# Patient Record
Sex: Female | Born: 1960
Health system: Southern US, Community
[De-identification: ages and names within clinical notes are randomized; demographics above are authoritative.]

## PROBLEM LIST (undated history)

## (undated) ENCOUNTER — Ambulatory Visit: Discharge status: Absent without leave | Payer: 59

## (undated) ENCOUNTER — Ambulatory Visit: Attending: Pharmacist | Primary: Pharmacist

## (undated) ENCOUNTER — Ambulatory Visit: Attending: Ambulatory Care | Primary: Ambulatory Care

## (undated) ENCOUNTER — Encounter: Attending: Ambulatory Care | Primary: Ambulatory Care

## (undated) ENCOUNTER — Encounter

## (undated) ENCOUNTER — Telehealth

## (undated) ENCOUNTER — Ambulatory Visit

## (undated) ENCOUNTER — Ambulatory Visit: Payer: Medicare (Managed Care)

## (undated) ENCOUNTER — Encounter: Payer: MEDICARE | Attending: Orthopaedic Surgery | Primary: Orthopaedic Surgery

## (undated) ENCOUNTER — Ambulatory Visit: Payer: MEDICARE | Attending: Rheumatology | Primary: Rheumatology

## (undated) ENCOUNTER — Ambulatory Visit: Payer: MEDICARE

## (undated) ENCOUNTER — Encounter: Attending: Orthopaedic Surgery | Primary: Orthopaedic Surgery

## (undated) ENCOUNTER — Encounter
Payer: MEDICARE | Attending: Student in an Organized Health Care Education/Training Program | Primary: Student in an Organized Health Care Education/Training Program

## (undated) ENCOUNTER — Telehealth: Attending: Clinical | Primary: Clinical

## (undated) ENCOUNTER — Encounter
Attending: Student in an Organized Health Care Education/Training Program | Primary: Student in an Organized Health Care Education/Training Program

## (undated) ENCOUNTER — Telehealth: Attending: Internal Medicine | Primary: Internal Medicine

## (undated) ENCOUNTER — Ambulatory Visit: Payer: MEDICARE | Attending: Orthopaedic Surgery | Primary: Orthopaedic Surgery

## (undated) ENCOUNTER — Encounter: Attending: Family | Primary: Family

## (undated) ENCOUNTER — Ambulatory Visit: Attending: Nurse Practitioner | Primary: Nurse Practitioner

## (undated) ENCOUNTER — Telehealth: Attending: Dermatology | Primary: Dermatology

## (undated) ENCOUNTER — Encounter: Attending: Rheumatology | Primary: Rheumatology

## (undated) ENCOUNTER — Ambulatory Visit: Payer: MEDICAID

## (undated) ENCOUNTER — Ambulatory Visit: Attending: Rheumatology | Primary: Rheumatology

## (undated) ENCOUNTER — Ambulatory Visit: Attending: Family Medicine | Primary: Family Medicine

## (undated) ENCOUNTER — Telehealth: Attending: Rheumatology | Primary: Rheumatology

## (undated) ENCOUNTER — Telehealth: Attending: Family | Primary: Family

## (undated) ENCOUNTER — Encounter: Payer: MEDICARE | Attending: Rheumatology | Primary: Rheumatology

## (undated) ENCOUNTER — Encounter: Attending: Clinical | Primary: Clinical

## (undated) ENCOUNTER — Telehealth: Attending: Ambulatory Care | Primary: Ambulatory Care

## (undated) ENCOUNTER — Ambulatory Visit
Payer: MEDICARE | Attending: Student in an Organized Health Care Education/Training Program | Primary: Student in an Organized Health Care Education/Training Program

## (undated) ENCOUNTER — Encounter: Attending: Adult Health | Primary: Adult Health

## (undated) ENCOUNTER — Encounter: Payer: MEDICARE | Attending: Dermatology | Primary: Dermatology

## (undated) ENCOUNTER — Ambulatory Visit: Payer: MEDICARE | Attending: Dermatology | Primary: Dermatology

## (undated) ENCOUNTER — Ambulatory Visit: Payer: MEDICARE | Attending: Family | Primary: Family

## (undated) ENCOUNTER — Ambulatory Visit: Payer: MEDICARE | Attending: Anesthesiology | Primary: Anesthesiology

## (undated) ENCOUNTER — Ambulatory Visit: Attending: Community Health | Primary: Community Health

## (undated) ENCOUNTER — Ambulatory Visit: Attending: Orthopaedic Surgery | Primary: Orthopaedic Surgery

## (undated) ENCOUNTER — Encounter: Payer: MEDICARE | Attending: Anesthesiology | Primary: Anesthesiology

## (undated) ENCOUNTER — Telehealth: Attending: Orthopaedic Surgery | Primary: Orthopaedic Surgery

## (undated) ENCOUNTER — Encounter: Attending: Family Medicine | Primary: Family Medicine

## (undated) ENCOUNTER — Non-Acute Institutional Stay: Payer: MEDICARE

## (undated) DIAGNOSIS — J449 Chronic obstructive pulmonary disease, unspecified: Secondary | ICD-10-CM

## (undated) DIAGNOSIS — M1990 Unspecified osteoarthritis, unspecified site: Secondary | ICD-10-CM

## (undated) DIAGNOSIS — F4310 Post-traumatic stress disorder, unspecified: Secondary | ICD-10-CM

## (undated) DIAGNOSIS — M797 Fibromyalgia: Secondary | ICD-10-CM

## (undated) DIAGNOSIS — M5030 Other cervical disc degeneration, unspecified cervical region: Secondary | ICD-10-CM

## (undated) DIAGNOSIS — F419 Anxiety disorder, unspecified: Secondary | ICD-10-CM

## (undated) DIAGNOSIS — F32A Depression, unspecified: Secondary | ICD-10-CM

## (undated) DIAGNOSIS — L409 Psoriasis, unspecified: Secondary | ICD-10-CM

## (undated) DIAGNOSIS — K589 Irritable bowel syndrome without diarrhea: Secondary | ICD-10-CM

## (undated) DIAGNOSIS — F329 Major depressive disorder, single episode, unspecified: Secondary | ICD-10-CM

## (undated) DIAGNOSIS — K219 Gastro-esophageal reflux disease without esophagitis: Secondary | ICD-10-CM

## (undated) DIAGNOSIS — E785 Hyperlipidemia, unspecified: Secondary | ICD-10-CM

## (undated) HISTORY — PX: TOTAL ABDOMINAL HYSTERECTOMY: SHX209

## (undated) HISTORY — PX: KNEE SURGERY: SHX244

## (undated) HISTORY — PX: NECK SURGERY: SHX720

## (undated) HISTORY — DX: Irritable bowel syndrome, unspecified: K58.9

## (undated) HISTORY — PX: ABDOMINAL HYSTERECTOMY: SHX81

## (undated) HISTORY — PX: CARPAL TUNNEL RELEASE: SHX101

## (undated) MED ORDER — LORATADINE 10 MG TABLET: 10 mg | tablet | Freq: Every day | 2 refills | 30 days

---
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
## 2007-02-18 ENCOUNTER — Emergency Department: Payer: Self-pay | Admitting: Emergency Medicine

## 2008-09-16 ENCOUNTER — Emergency Department: Payer: Self-pay | Admitting: Emergency Medicine

## 2010-04-28 ENCOUNTER — Emergency Department: Payer: Self-pay | Admitting: Emergency Medicine

## 2010-05-11 ENCOUNTER — Emergency Department: Payer: Self-pay | Admitting: Emergency Medicine

## 2010-08-25 ENCOUNTER — Emergency Department: Payer: Self-pay | Admitting: Unknown Physician Specialty

## 2010-09-14 ENCOUNTER — Emergency Department: Payer: Self-pay | Admitting: Emergency Medicine

## 2010-11-10 ENCOUNTER — Emergency Department: Payer: Self-pay

## 2010-11-12 ENCOUNTER — Inpatient Hospital Stay: Payer: Self-pay | Admitting: *Deleted

## 2010-12-15 ENCOUNTER — Emergency Department: Payer: Self-pay | Admitting: Emergency Medicine

## 2011-05-03 ENCOUNTER — Emergency Department: Payer: Self-pay | Admitting: Emergency Medicine

## 2012-06-17 ENCOUNTER — Emergency Department: Payer: Self-pay | Admitting: Emergency Medicine

## 2013-04-22 ENCOUNTER — Emergency Department: Payer: Self-pay | Admitting: Emergency Medicine

## 2013-04-22 LAB — COMPREHENSIVE METABOLIC PANEL
Albumin: 3.2 g/dL — ABNORMAL LOW (ref 3.4–5.0)
Anion Gap: 3 — ABNORMAL LOW (ref 7–16)
Co2: 28 mmol/L (ref 21–32)
Creatinine: 0.73 mg/dL (ref 0.60–1.30)
EGFR (African American): >60
EGFR (Non-African Amer.): >60
Glucose: 93 mg/dL (ref 65–99)
Osmolality: 287 (ref 275–301)
SGOT(AST): 36 U/L (ref 15–37)
SGPT (ALT): 33 U/L (ref 12–78)
Sodium: 144 mmol/L (ref 136–145)
Total Protein: 6.1 g/dL — ABNORMAL LOW (ref 6.4–8.2)

## 2013-04-22 LAB — SEDIMENTATION RATE: Erythrocyte Sed Rate: 8 mm/hr (ref 0–30)

## 2013-04-22 LAB — CBC WITH DIFFERENTIAL/PLATELET
Basophil #: 0.1 10*3/uL (ref 0.0–0.1)
Basophil %: 1.3 %
Eosinophil #: 0.3 10*3/uL (ref 0.0–0.7)
Eosinophil %: 3.7 %
HCT: 37 % (ref 35.0–47.0)
Lymphocyte %: 35.1 %
MCH: 35.6 pg — ABNORMAL HIGH (ref 26.0–34.0)
MCHC: 34.8 g/dL (ref 32.0–36.0)
MCV: 102 fL — ABNORMAL HIGH (ref 80–100)
WBC: 7.9 10*3/uL (ref 3.6–11.0)

## 2013-09-10 ENCOUNTER — Emergency Department: Payer: Self-pay | Admitting: Emergency Medicine

## 2016-01-25 ENCOUNTER — Other Ambulatory Visit: Payer: Self-pay | Admitting: General Practice

## 2016-01-25 DIAGNOSIS — J449 Chronic obstructive pulmonary disease, unspecified: Secondary | ICD-10-CM

## 2016-02-07 ENCOUNTER — Ambulatory Visit: Payer: Self-pay

## 2016-02-12 ENCOUNTER — Ambulatory Visit: Payer: Disability Insurance | Attending: General Practice

## 2016-02-12 DIAGNOSIS — J449 Chronic obstructive pulmonary disease, unspecified: Secondary | ICD-10-CM | POA: Diagnosis not present

## 2016-09-11 ENCOUNTER — Emergency Department
Admission: EM | Admit: 2016-09-11 | Discharge: 2016-09-11 | Disposition: A | Discharge status: Home / Self Care | Payer: Disability Insurance | Attending: Emergency Medicine | Admitting: Emergency Medicine

## 2016-09-11 ENCOUNTER — Emergency Department: Payer: Disability Insurance

## 2016-09-11 ENCOUNTER — Encounter: Payer: Self-pay | Admitting: Emergency Medicine

## 2016-09-11 DIAGNOSIS — M545 Low back pain: Secondary | ICD-10-CM | POA: Insufficient documentation

## 2016-09-11 DIAGNOSIS — J449 Chronic obstructive pulmonary disease, unspecified: Secondary | ICD-10-CM | POA: Insufficient documentation

## 2016-09-11 DIAGNOSIS — Y929 Unspecified place or not applicable: Secondary | ICD-10-CM | POA: Insufficient documentation

## 2016-09-11 DIAGNOSIS — Y9301 Activity, walking, marching and hiking: Secondary | ICD-10-CM | POA: Insufficient documentation

## 2016-09-11 DIAGNOSIS — Z79899 Other long term (current) drug therapy: Secondary | ICD-10-CM | POA: Insufficient documentation

## 2016-09-11 DIAGNOSIS — Y999 Unspecified external cause status: Secondary | ICD-10-CM | POA: Insufficient documentation

## 2016-09-11 DIAGNOSIS — W010XXA Fall on same level from slipping, tripping and stumbling without subsequent striking against object, initial encounter: Secondary | ICD-10-CM | POA: Insufficient documentation

## 2016-09-11 DIAGNOSIS — W19XXXA Unspecified fall, initial encounter: Secondary | ICD-10-CM

## 2016-09-11 DIAGNOSIS — F17200 Nicotine dependence, unspecified, uncomplicated: Secondary | ICD-10-CM | POA: Insufficient documentation

## 2016-09-11 DIAGNOSIS — S86812A Strain of other muscle(s) and tendon(s) at lower leg level, left leg, initial encounter: Secondary | ICD-10-CM | POA: Insufficient documentation

## 2016-09-11 DIAGNOSIS — S86912A Strain of unspecified muscle(s) and tendon(s) at lower leg level, left leg, initial encounter: Secondary | ICD-10-CM

## 2016-09-11 DIAGNOSIS — S93402A Sprain of unspecified ligament of left ankle, initial encounter: Secondary | ICD-10-CM

## 2016-09-11 HISTORY — DX: Unspecified osteoarthritis, unspecified site: M19.90

## 2016-09-11 HISTORY — DX: Chronic obstructive pulmonary disease, unspecified: J44.9

## 2016-09-11 HISTORY — DX: Anxiety disorder, unspecified: F41.9

## 2016-09-11 HISTORY — DX: Depression, unspecified: F32.A

## 2016-09-11 HISTORY — DX: Major depressive disorder, single episode, unspecified: F32.9

## 2016-09-11 MED ORDER — HYDROCODONE-ACETAMINOPHEN 5-325 MG PO TABS
1.0000 | ORAL_TABLET | Freq: Once | ORAL | Status: AC
  Administered 2016-09-11: 1 via ORAL
  Filled 2016-09-11: qty 1

## 2016-09-11 MED ORDER — HYDROCODONE-ACETAMINOPHEN 5-325 MG PO TABS: 1.0000 | ORAL_TABLET | ORAL | 0 refills | Status: DC | PRN

## 2016-09-11 MED ORDER — MELOXICAM 15 MG PO TABS: 15.0000 mg | ORAL_TABLET | Freq: Every day | ORAL | 0 refills | Status: DC

## 2016-09-11 NOTE — ED Notes (Signed)
See triage note  States she tripped yesterday   Landed on left side  having pain to left ankle,knee lower back and left side of face

## 2016-09-11 NOTE — ED Triage Notes (Signed)
Pt to ed with c/o fall yesterday while walking outside. Pt states she tripped and fell.  Now with c/o left side pain, left leg pain and left ankle pain.  Pt alert at triage.  Pt denies loss of consciousness with fall.

## 2016-09-11 NOTE — ED Provider Notes (Signed)
[facility] Emergency Department Provider Note  ____________________________________________  Time seen: Approximately 3:32 PM  I have reviewed the triage vital signs and the nursing notes.   HISTORY  Chief Complaint Fall    HPI Emily Johnston is a 55 y.o. female who presents emergency department complaining of left ankle, left knee, lower back pain. Patient states that she was outside walking on her property when she tripped and fell yesterday. She states that she landed on her left side. She is now complaining of left ankle, left knee, lower back pain. Patient denies any radicular symptoms, bowel or bladder disorders, saddle anesthesia, paresthesias. Patient reports that she is able to bear weight but doing so results and great increase in pain. Patient tried over-the-counter medication at home with no relief. No other complaints at this time. Patient did not hit her head or lose consciousness at anytime.   Past Medical History:  Diagnosis Date  . Anxiety   . Arthritis   . COPD (chronic obstructive pulmonary disease) ([place])   . Depression     There are no active problems to display for this patient.   History reviewed. No pertinent surgical history.  Prior to Admission medications   Medication Sig Start Date End Date Taking? Authorizing Provider  ALPRAZolam Duanne Moron) 1 MG tablet Take 1 mg by mouth at bedtime as needed for anxiety.   Yes Historical Provider, MD  methotrexate (50 MG/ML) 1 g injection Inject into the vein once.   Yes Historical Provider, MD  venlafaxine (EFFEXOR) 50 MG tablet Take 50 mg by mouth 2 (two) times daily.   Yes Historical Provider, MD  HYDROcodone-acetaminophen (NORCO/VICODIN) 5-325 MG tablet Take 1 tablet by mouth every 4 (four) hours as needed for moderate pain. 09/11/16   Charline Bills Marius Betts, PA-C  meloxicam (MOBIC) 15 MG tablet Take 1 tablet (15 mg total) by mouth daily. 09/11/16   Charline Bills Idolina Mantell, PA-C     Allergies Patient has no known allergies.  History reviewed. No pertinent family history.  Social History Social History  Substance Use Topics  . Smoking status: Current Every Day Smoker  . Smokeless tobacco: Never Used  . Alcohol use No     Review of Systems  Constitutional: No fever/chills Cardiovascular: no chest pain. Respiratory: no cough. No SOB. Musculoskeletal: Positive for lower back, left knee, left ankle pain Skin: Negative for rash, abrasions, lacerations, ecchymosis. Neurological: Negative for headaches, focal weakness or numbness. 10-point ROS otherwise negative.  ____________________________________________   PHYSICAL EXAM:  VITAL SIGNS: ED Triage Vitals  Enc Vitals Group     BP 09/11/16 1459 (!) 135/93     Pulse Rate 09/11/16 1459 96     Resp 09/11/16 1459 20     Temp 09/11/16 1459 98.5 F (36.9 C)     Temp Source 09/11/16 1459 Oral     SpO2 09/11/16 1459 100 %     Weight 09/11/16 1459 140 lb (63.5 kg)     Height 09/11/16 1459 5\' 4"  (1.626 m)     Head Circumference --      Peak Flow --      Pain Score 09/11/16 1500 10     Pain Loc --      Pain Edu? --      Excl. in [place]? --      Constitutional: Alert and oriented. Well appearing and in no acute distress. Eyes: Conjunctivae are normal. PERRL. EOMI. Head: Atraumatic. Neck: No stridor.  No cervical spine tenderness to palpation.  Cardiovascular: Normal rate, regular rhythm. Normal S1 and S2.  Good peripheral circulation. Respiratory: Normal respiratory effort without tachypnea or retractions. Lungs CTAB. Good air entry to the bases with no decreased or absent breath sounds. Musculoskeletal: Full range of motion to all extremities. No gross deformities appreciated.Patient is mildly tender to palpation midline lumbar spine region. No palpable abnormality. No step-off. No tenderness to palpation bilateral sciatic notches. There ispulse and sensation intact and equal lower extremities. No deformity  noted to left knee. Full range of motion. Patient is tender to palpation along the lateral aspect of the knee. Varus, valgus, Lachman's, McMurray's is negative. No deformity or edema noted to the left ankle but inspection. Full range of motion. Patient is tender to palpation over the lateral malleolus. Sensation and pulses intact bilateral lower extremity. Neurologic:  Normal speech and language. No gross focal neurologic deficits are appreciated.  Skin:  Skin is warm, dry and intact. No rash noted. Psychiatric: Mood and affect are normal. Speech and behavior are normal. Patient exhibits appropriate insight and judgement.   ____________________________________________   LABS (all labs ordered are listed, but only abnormal results are displayed)  Labs Reviewed - No data to display ____________________________________________  EKG   ____________________________________________  RADIOLOGY Diamantina Providence Rotunda Worden, personally viewed and evaluated these images (plain radiographs) as part of my medical decision making, as well as reviewing the written report by the radiologist.  Dg Lumbar Spine Complete  Result Date: 09/11/2016 CLINICAL DATA:  [place] yesterday and injured back. EXAM: LUMBAR SPINE - COMPLETE 4+ VIEW COMPARISON:  None. FINDINGS: Normal alignment of the lumbar vertebral bodies. Disc spaces and vertebral bodies are maintained. Mild degenerative changes with small marginal osteophytes. The facets are normally aligned. No pars defects. The visualized bony pelvis is intact. The SI joints appear normal. Age advanced aortoiliac vascular calcifications are noted without definite aneurysm. IMPRESSION: Normal alignment and no acute bony findings. Minimal degenerative changes. Age advanced atherosclerotic calcifications. Electronically Signed   By: Marijo Sanes M.D.   On: 09/11/2016 16:26   Dg Ankle Complete Left  Result Date: 09/11/2016 CLINICAL DATA:  [place] yesterday and injured left  ankle. EXAM: LEFT ANKLE COMPLETE - 3+ VIEW COMPARISON:  06/17/2012 FINDINGS: The ankle mortise is maintained. No ankle fracture or osteochondral abnormality. No definite ankle joint effusion. The mid and hindfoot bony structures are intact. Small calcaneal heel spur. Os trigonum is also noted. IMPRESSION: No acute fracture. Electronically Signed   By: Marijo Sanes M.D.   On: 09/11/2016 16:25   Dg Knee Complete 4 Views Left  Result Date: 09/11/2016 CLINICAL DATA:  [place] yesterday and injured left knee. EXAM: LEFT KNEE - COMPLETE 4+ VIEW COMPARISON:  None. FINDINGS: The joint spaces are maintained. No acute fracture, significant degenerative changes or osteochondral abnormality. No joint effusion. IMPRESSION: No acute bony findings. Electronically Signed   By: Marijo Sanes M.D.   On: 09/11/2016 16:24    ____________________________________________    PROCEDURES  Procedure(s) performed:    Procedures    Medications  HYDROcodone-acetaminophen (NORCO/VICODIN) 5-325 MG per tablet 1 tablet (1 tablet Oral Given 09/11/16 1556)     ____________________________________________   INITIAL IMPRESSION / ASSESSMENT AND PLAN / ED COURSE  Pertinent labs & imaging results that were available during my care of the patient were reviewed by me and considered in my medical decision making (see chart for details).  Review of the [place] CSRS was performed in accordance of the [place] prior to dispensing any controlled drugs.  Clinical  Course     Patient's diagnosis is consistent with Fall resulting in strain and sprain of the left knee and ankle. No acute findings on x-ray. Exam is reassuring. No indication for further imaging or labs.. Patient will be discharged home with prescriptions for anti-inflammatories and limited pain medication. Patient is to follow up with primary care as needed or otherwise directed. Patient is given ED precautions to return to the ED for any worsening or new  symptoms.     ____________________________________________  FINAL CLINICAL IMPRESSION(S) / ED DIAGNOSES  Final diagnoses:  Fall, initial encounter  Strain of left knee, initial encounter  Sprain of left ankle, unspecified ligament, initial encounter      NEW MEDICATIONS STARTED DURING THIS VISIT:  New Prescriptions   HYDROCODONE-ACETAMINOPHEN (NORCO/VICODIN) 5-325 MG TABLET    Take 1 tablet by mouth every 4 (four) hours as needed for moderate pain.   MELOXICAM (MOBIC) 15 MG TABLET    Take 1 tablet (15 mg total) by mouth daily.        This chart was dictated using voice recognition software/Dragon. Despite best efforts to proofread, errors can occur which can change the meaning. Any change was purely unintentional.    Darletta Moll, PA-C 09/11/16 Palmer Yao, MD 09/12/16 1758

## 2016-11-03 ENCOUNTER — Emergency Department
Admission: EM | Admit: 2016-11-03 | Discharge: 2016-11-03 | Disposition: A | Discharge status: Home / Self Care | Payer: Self-pay | Attending: Student in an Organized Health Care Education/Training Program | Admitting: Student in an Organized Health Care Education/Training Program

## 2016-11-03 ENCOUNTER — Emergency Department: Payer: Self-pay

## 2016-11-03 ENCOUNTER — Encounter: Payer: Self-pay | Admitting: *Deleted

## 2016-11-03 DIAGNOSIS — M545 Low back pain, unspecified: Secondary | ICD-10-CM

## 2016-11-03 DIAGNOSIS — R319 Hematuria, unspecified: Secondary | ICD-10-CM | POA: Insufficient documentation

## 2016-11-03 DIAGNOSIS — F17200 Nicotine dependence, unspecified, uncomplicated: Secondary | ICD-10-CM | POA: Insufficient documentation

## 2016-11-03 DIAGNOSIS — G8929 Other chronic pain: Secondary | ICD-10-CM

## 2016-11-03 DIAGNOSIS — Z79899 Other long term (current) drug therapy: Secondary | ICD-10-CM | POA: Insufficient documentation

## 2016-11-03 DIAGNOSIS — J449 Chronic obstructive pulmonary disease, unspecified: Secondary | ICD-10-CM | POA: Insufficient documentation

## 2016-11-03 LAB — URINALYSIS, COMPLETE (UACMP) WITH MICROSCOPIC
Bacteria, UA: NONE SEEN
Bilirubin Urine: NEGATIVE
GLUCOSE, UA: NEGATIVE mg/dL
Ketones, ur: NEGATIVE mg/dL
Leukocytes, UA: NEGATIVE
NITRITE: NEGATIVE
PH: 5 (ref 5.0–8.0)
Protein, ur: NEGATIVE mg/dL
Specific Gravity, Urine: 1.006 (ref 1.005–1.030)

## 2016-11-03 MED ORDER — CYCLOBENZAPRINE HCL 10 MG PO TABS: 10.0000 mg | ORAL_TABLET | Freq: Three times a day (TID) | ORAL | 0 refills | Status: DC | PRN

## 2016-11-03 MED ORDER — CYCLOBENZAPRINE HCL 10 MG PO TABS
ORAL_TABLET | ORAL | Status: AC
  Filled 2016-11-03: qty 1

## 2016-11-03 MED ORDER — POLYETHYLENE GLYCOL 3350 17 G PO PACK: 17.0000 g | PACK | Freq: Every day | ORAL | 0 refills | Status: DC

## 2016-11-03 MED ORDER — BUPIVACAINE HCL (PF) 0.5 % IJ SOLN
INTRAMUSCULAR | Status: AC
  Administered 2016-11-03: 30 mL
  Filled 2016-11-03: qty 30

## 2016-11-03 MED ORDER — OXYCODONE-ACETAMINOPHEN 5-325 MG PO TABS
ORAL_TABLET | ORAL | Status: AC
  Filled 2016-11-03: qty 1

## 2016-11-03 MED ORDER — OXYCODONE-ACETAMINOPHEN 5-325 MG PO TABS
1.0000 | ORAL_TABLET | Freq: Once | ORAL | Status: AC
  Administered 2016-11-03: 1 via ORAL

## 2016-11-03 MED ORDER — BUPIVACAINE HCL (PF) 0.5 % IJ SOLN
30.0000 mL | Freq: Once | INTRAMUSCULAR | Status: AC
  Administered 2016-11-03: 30 mL
  Filled 2016-11-03: qty 30

## 2016-11-03 MED ORDER — HYDROCODONE-ACETAMINOPHEN 5-325 MG PO TABS
2.0000 | ORAL_TABLET | Freq: Four times a day (QID) | ORAL | 0 refills | Status: DC | PRN
Start: 2016-11-03 — End: 2019-02-22

## 2016-11-03 MED ORDER — CYCLOBENZAPRINE HCL 10 MG PO TABS
5.0000 mg | ORAL_TABLET | Freq: Once | ORAL | Status: AC
  Administered 2016-11-03: 5 mg via ORAL

## 2016-11-03 NOTE — ED Notes (Signed)
Pt states back pain that is worse than normal. Denies it feeling like spasm, describes it as pulling. Pt ambulatory but any slight movement from side to side or turning makes pain worse. Denies falling or being hit in back. Fell few weeks ago and sprained L knee and L ankle. States back was bruised then but pain was not like it is now.

## 2016-11-03 NOTE — ED Provider Notes (Signed)
[facility] Emergency Department Provider Note    First MD Initiated Contact with Patient 11/03/16 1545     (approximate)  I have reviewed the triage vital signs and the nursing notes.   HISTORY  Chief Complaint Back Pain    HPI Emily Johnston is a 56 y.o. female chief complaint of left lower back pain since yesterday. Denies any flank pain or dysuria.  She denies any numbness or tingling. States that she is having difficulty standing up and had to sit on her commode to get her pants on today. Pain is worsened with movement of the trunk and low back. Pain is worsened when trying to pick things up off the floor. She is not having any tingling in her perineal area. No difficulty moving her bowels. No difficulty urinating.   Past Medical History:  Diagnosis Date  . Anxiety   . Arthritis   . COPD (chronic obstructive pulmonary disease) ([place])   . Depression    History reviewed. No pertinent family history. History reviewed. No pertinent surgical history. There are no active problems to display for this patient.     Prior to Admission medications   Medication Sig Start Date End Date Taking? Authorizing Provider  ALPRAZolam Duanne Moron) 1 MG tablet Take 1 mg by mouth at bedtime as needed for anxiety.    Historical Provider, MD  cyclobenzaprine (FLEXERIL) 10 MG tablet Take 1 tablet (10 mg total) by mouth 3 (three) times daily as needed for muscle spasms. 11/03/16   Merlyn Lot, MD  HYDROcodone-acetaminophen (NORCO) 5-325 MG tablet Take 2 tablets by mouth every 6 (six) hours as needed for moderate pain. 11/03/16   Merlyn Lot, MD  HYDROcodone-acetaminophen (NORCO/VICODIN) 5-325 MG tablet Take 1 tablet by mouth every 4 (four) hours as needed for moderate pain. 09/11/16   Charline Bills Cuthriell, PA-C  meloxicam (MOBIC) 15 MG tablet Take 1 tablet (15 mg total) by mouth daily. 09/11/16   Charline Bills Cuthriell, PA-C  methotrexate (50 MG/ML) 1 g injection  Inject into the vein once.    Historical Provider, MD  polyethylene glycol (MIRALAX / GLYCOLAX) packet Take 17 g by mouth daily. Mix one tablespoon with 8oz of your favorite juice or water every day until you are having soft formed stools. Then start taking once daily if you didn't have a stool the day before. 11/03/16   Merlyn Lot, MD  venlafaxine (EFFEXOR) 50 MG tablet Take 50 mg by mouth 2 (two) times daily.    Historical Provider, MD    Allergies Patient has no known allergies.    Social History Social History  Substance Use Topics  . Smoking status: Current Every Day Smoker  . Smokeless tobacco: Never Used  . Alcohol use No    Review of Systems Patient denies headaches, rhinorrhea, blurry vision, numbness, shortness of breath, chest pain, edema, cough, abdominal pain, nausea, vomiting, diarrhea, dysuria, fevers, rashes or hallucinations unless otherwise stated above in HPI. ____________________________________________   PHYSICAL EXAM:  VITAL SIGNS: Vitals:   11/03/16 1453  BP: (!) 153/86  Pulse: 80  Resp: 18  Temp: 98.3 F (36.8 C)    Constitutional: Alert and oriented. Well appearing and in no acute distress. Eyes: Conjunctivae are normal. PERRL. EOMI. Head: Atraumatic. Nose: No congestion/rhinnorhea. Mouth/Throat: Mucous membranes are moist.  Oropharynx non-erythematous. Neck: No stridor. Painless ROM. No cervical spine tenderness to palpation Hematological/Lymphatic/Immunilogical: No cervical lymphadenopathy. Cardiovascular: Normal rate, regular rhythm. Grossly normal heart sounds.  Good peripheral circulation. Respiratory: Normal  respiratory effort.  No retractions. Lungs CTAB. Gastrointestinal: Soft and nontender. No distention. No abdominal bruits. No CVA tenderness. Genitourinary:  Musculoskeletal: No lower extremity tenderness nor edema.  No joint effusions.  ttp over lying left SI joint Neurologic:  Normal speech and language. No gross focal  neurologic deficits are appreciated. No gait instability.  5/5 strenght BLE.  DTRs 2+ and equal.  SILT distally.  -straight leg raise.  Pain reproduce with elevation of right leg Skin:  Skin is warm, dry and intact. No rash noted. Psychiatric: Mood and affect are normal. Speech and behavior are normal.  ____________________________________________   LABS (all labs ordered are listed, but only abnormal results are displayed)  Results for orders placed or performed during the hospital encounter of 11/03/16 (from the past 24 hour(s))  Urinalysis, Complete w Microscopic     Status: Abnormal   Collection Time: 11/03/16  2:54 PM  Result Value Ref Range   Color, Urine YELLOW (A) YELLOW   APPearance CLEAR (A) CLEAR   Specific Gravity, Urine 1.006 1.005 - 1.030   pH 5.0 5.0 - 8.0   Glucose, UA NEGATIVE NEGATIVE mg/dL   Hgb urine dipstick MODERATE (A) NEGATIVE   Bilirubin Urine NEGATIVE NEGATIVE   Ketones, ur NEGATIVE NEGATIVE mg/dL   Protein, ur NEGATIVE NEGATIVE mg/dL   Nitrite NEGATIVE NEGATIVE   Leukocytes, UA NEGATIVE NEGATIVE   RBC / HPF 6-30 0 - 5 RBC/hpf   WBC, UA 0-5 0 - 5 WBC/hpf   Bacteria, UA NONE SEEN NONE SEEN   Squamous Epithelial / LPF 0-5 (A) NONE SEEN   Mucous PRESENT    ____________________________________________  EKG____________________________________________  RADIOLOGY  I personally reviewed all radiographic images ordered to evaluate for the above acute complaints and reviewed radiology reports and findings.  These findings were personally discussed with the patient.  Please see medical record for radiology report.  ____________________________________________   PROCEDURES  Procedure(s) performed:  Procedures    Critical Care performed: no ____________________________________________   INITIAL IMPRESSION / ASSESSMENT AND PLAN / ED COURSE  Pertinent labs & imaging results that were available during my care of the patient were reviewed by me and  considered in my medical decision making (see chart for details).  DDX: msk strain, lumbago, fracture, stone, uti  Emily Johnston is a 56 y.o. who presents to the ED with No history of injury or trauma. No recent back instrumentation/procedures. No fevers. Denies cord compression symptoms. No bowel/bladder incontinence or retention, no LE weakness. VSS in ED. Exam with no LE weakness bilat., no sensory deficits, normal DTRs, no clonus, no saddle anesthesia. Pain with palpation of back and with trunk movement. Likely MSK related pain, probable lumbar strain. History and physical exam less consistent with kidney stone or pyelonephritis but will be evaluated with UA and KUB. Treatments will include observation, analgesia, and arrange appropriate follow up for recheck.   Clinical Course as of Nov 03 1724  Mon Nov 03, 2016  1709 Urinalysis without any evidence of infection. Does have small amount of blood which may represent a kidney stone. Exam seems very muscle skeletal. Patient had taken relief in back pain after injection of local Marcaine to the point of maximal tenderness in her back. Do suspect there is a large component of muscular skeletal strain lumbago.  [PR]    Clinical Course User Index [PR] Merlyn Lot, MD    Clinical picture is not consistent with epidural abscess , fracture, or cauda equina syndrome. Plan supportive care, follow  up for recheck   ____________________________________________   FINAL CLINICAL IMPRESSION(S) / ED DIAGNOSES  Final diagnoses:  Chronic left-sided low back pain without sciatica  Hematuria, unspecified type      NEW MEDICATIONS STARTED DURING THIS VISIT:  New Prescriptions   CYCLOBENZAPRINE (FLEXERIL) 10 MG TABLET    Take 1 tablet (10 mg total) by mouth 3 (three) times daily as needed for muscle spasms.   HYDROCODONE-ACETAMINOPHEN (NORCO) 5-325 MG TABLET    Take 2 tablets by mouth every 6 (six) hours as needed for moderate pain.    POLYETHYLENE GLYCOL (MIRALAX / GLYCOLAX) PACKET    Take 17 g by mouth daily. Mix one tablespoon with 8oz of your favorite juice or water every day until you are having soft formed stools. Then start taking once daily if you didn't have a stool the day before.     Note:  This document was prepared using Dragon voice recognition software and may include unintentional dictation errors.    Merlyn Lot, MD 11/03/16 [phone]

## 2016-11-03 NOTE — ED Triage Notes (Addendum)
States lower back pain since yesterday, denies any urinary symptoms, denies any known injury, states increased pain with movement

## 2016-11-03 NOTE — Discharge Instructions (Signed)

## 2016-11-03 NOTE — ED Notes (Signed)
Pt taken to xray 

## 2017-02-19 ENCOUNTER — Emergency Department: Payer: Self-pay

## 2017-02-19 ENCOUNTER — Encounter: Payer: Self-pay | Admitting: Emergency Medicine

## 2017-02-19 ENCOUNTER — Emergency Department
Admission: EM | Admit: 2017-02-19 | Discharge: 2017-02-19 | Disposition: A | Discharge status: Home / Self Care | Payer: Self-pay | Attending: Emergency Medicine | Admitting: Emergency Medicine

## 2017-02-19 DIAGNOSIS — F13930 Sedative, hypnotic or anxiolytic use, unspecified with withdrawal, uncomplicated: Secondary | ICD-10-CM

## 2017-02-19 DIAGNOSIS — F17200 Nicotine dependence, unspecified, uncomplicated: Secondary | ICD-10-CM | POA: Insufficient documentation

## 2017-02-19 DIAGNOSIS — F13230 Sedative, hypnotic or anxiolytic dependence with withdrawal, uncomplicated: Secondary | ICD-10-CM

## 2017-02-19 DIAGNOSIS — F419 Anxiety disorder, unspecified: Secondary | ICD-10-CM | POA: Insufficient documentation

## 2017-02-19 DIAGNOSIS — F13239 Sedative, hypnotic or anxiolytic dependence with withdrawal, unspecified: Secondary | ICD-10-CM | POA: Insufficient documentation

## 2017-02-19 DIAGNOSIS — J449 Chronic obstructive pulmonary disease, unspecified: Secondary | ICD-10-CM | POA: Insufficient documentation

## 2017-02-19 HISTORY — DX: Post-traumatic stress disorder, unspecified: F43.10

## 2017-02-19 LAB — COMPREHENSIVE METABOLIC PANEL
ALK PHOS: 82 U/L (ref 38–126)
ALT: 18 U/L (ref 14–54)
ANION GAP: 8 (ref 5–15)
AST: 24 U/L (ref 15–41)
Albumin: 3.8 g/dL (ref 3.5–5.0)
BILIRUBIN TOTAL: 0.5 mg/dL (ref 0.3–1.2)
BUN: 10 mg/dL (ref 6–20)
CALCIUM: 9.5 mg/dL (ref 8.9–10.3)
CO2: 26 mmol/L (ref 22–32)
CREATININE: 0.83 mg/dL (ref 0.44–1.00)
Chloride: 108 mmol/L (ref 101–111)
Glucose, Bld: 103 mg/dL — ABNORMAL HIGH (ref 65–99)
Potassium: 3.3 mmol/L — ABNORMAL LOW (ref 3.5–5.1)
Sodium: 142 mmol/L (ref 135–145)
TOTAL PROTEIN: 6.8 g/dL (ref 6.5–8.1)

## 2017-02-19 LAB — CBC
HCT: 39.7 % (ref 35.0–47.0)
HEMOGLOBIN: 13.8 g/dL (ref 12.0–16.0)
MCH: 33.7 pg (ref 26.0–34.0)
MCHC: 34.7 g/dL (ref 32.0–36.0)
MCV: 97.2 fL (ref 80.0–100.0)
PLATELETS: 252 10*3/uL (ref 150–440)
RBC: 4.08 MIL/uL (ref 3.80–5.20)
RDW: 15.1 % — ABNORMAL HIGH (ref 11.5–14.5)
WBC: 6.4 10*3/uL (ref 3.6–11.0)

## 2017-02-19 LAB — TROPONIN I

## 2017-02-19 LAB — URINE DRUG SCREEN, QUALITATIVE (ARMC ONLY)
Amphetamines, Ur Screen: NOT DETECTED
BARBITURATES, UR SCREEN: NOT DETECTED
Benzodiazepine, Ur Scrn: POSITIVE — AB
CANNABINOID 50 NG, UR ~~LOC~~: POSITIVE — AB
COCAINE METABOLITE, UR ~~LOC~~: NOT DETECTED
MDMA (ECSTASY) UR SCREEN: NOT DETECTED
Methadone Scn, Ur: NOT DETECTED
Opiate, Ur Screen: NOT DETECTED
Phencyclidine (PCP) Ur S: NOT DETECTED
TRICYCLIC, UR SCREEN: NOT DETECTED

## 2017-02-19 MED ORDER — ALPRAZOLAM 1 MG PO TABS: 1.0000 mg | ORAL_TABLET | Freq: Three times a day (TID) | ORAL | 0 refills | Status: AC

## 2017-02-19 MED ORDER — ALPRAZOLAM 0.5 MG PO TABS
1.0000 mg | ORAL_TABLET | Freq: Once | ORAL | Status: AC
  Administered 2017-02-19: 1 mg via ORAL
  Filled 2017-02-19: qty 2

## 2017-02-19 NOTE — ED Provider Notes (Signed)
[facility] Emergency Department Provider Note       Time seen: ----------------------------------------- 5:09 PM on 02/19/2017 -----------------------------------------     I have reviewed the triage vital signs and the nursing notes.   HISTORY   Chief Complaint Withdrawal    HPI Emily Johnston is a 56 y.o. female who presents to the ED for benzodiazepine withdrawal. Patient states she's been on Xanax for 14 years and was subsequently switched Ativan. Patient ran out of all of her benzodiazepines by the end of April. Reports of brothers been giving her a few Klonopin but she has not had anything in 2 days. She has nausea and has been shaking which she believes is from withdrawal. She has generalized body aches. Patient does not want detox but would like to be placed back on her benzodiazepine.   Past Medical History:  Diagnosis Date  . Anxiety   . Arthritis   . COPD (chronic obstructive pulmonary disease) ([place])   . Depression   . PTSD (post-traumatic stress disorder)     There are no active problems to display for this patient.   History reviewed. No pertinent surgical history.  Allergies Patient has no known allergies.  Social History Social History  Substance Use Topics  . Smoking status: Current Every Day Smoker  . Smokeless tobacco: Never Used  . Alcohol use No    Review of Systems Constitutional: Negative for fever.Positive for chills and aches Eyes: Negative for vision changes ENT:  Negative for congestion, sore throat Cardiovascular: Negative for chest pain. Respiratory: Negative for shortness of breath. Positive for cough Gastrointestinal: Negative for abdominal pain, vomiting and diarrhea. Genitourinary: Negative for dysuria. Musculoskeletal: Negative for back pain. Skin: Negative for rash. Neurological: Negative for headaches, focal weakness or numbness.  All systems negative/normal/unremarkable except as stated  in the HPI  ____________________________________________   PHYSICAL EXAM:  VITAL SIGNS: ED Triage Vitals  Enc Vitals Group     BP 02/19/17 1517 (!) 122/57     Pulse Rate 02/19/17 1517 81     Resp 02/19/17 1517 20     Temp 02/19/17 1517 98.5 F (36.9 C)     Temp Source 02/19/17 1517 Oral     SpO2 02/19/17 1517 95 %     Weight 02/19/17 1521 145 lb (65.8 kg)     Height 02/19/17 1521 5\' 4"  (1.626 m)     Head Circumference --      Peak Flow --      Pain Score 02/19/17 1520 6     Pain Loc --      Pain Edu? --      Excl. in [place]? --     Constitutional: Alert and oriented. Well appearing and in no distress. Eyes: Conjunctivae are normal. Normal extraocular movements. ENT   Head: Normocephalic and atraumatic.   Nose: No congestion/rhinnorhea.   Mouth/Throat: Mucous membranes are moist.   Neck: No stridor. Cardiovascular: Normal rate, regular rhythm. No murmurs, rubs, or gallops. Respiratory: Normal respiratory effort without tachypnea nor retractions. Breath sounds are clear and equal bilaterally. No wheezes/rales/rhonchi. Gastrointestinal: Soft and nontender. Normal bowel sounds Musculoskeletal: Nontender with normal range of motion in extremities. No lower extremity tenderness nor edema. Neurologic:  Normal speech and language. No gross focal neurologic deficits are appreciated.  Skin:  Skin is warm, dry and intact. No rash noted. Psychiatric: Mood and affect are normal. Speech and behavior are normal.  ____________________________________________  ED COURSE:  Pertinent labs & imaging results that were  available during my care of the patient were reviewed by me and considered in my medical decision making (see chart for details). Patient presents for Xanax withdrawal, we will assess with labs and imaging as indicated.   Procedures ____________________________________________   LABS (pertinent positives/negatives)  Labs Reviewed  COMPREHENSIVE METABOLIC PANEL -  Abnormal; Notable for the following:       Result Value   Potassium 3.3 (*)    Glucose, Bld 103 (*)    All other components within normal limits  CBC - Abnormal; Notable for the following:    RDW 15.1 (*)    All other components within normal limits  URINE DRUG SCREEN, QUALITATIVE ([facility] ONLY) - Abnormal; Notable for the following:    Cannabinoid 50 Ng, Ur [place] POSITIVE (*)    Benzodiazepine, Ur Scrn POSITIVE (*)    All other components within normal limits  TROPONIN I   ____________________________________________  FINAL ASSESSMENT AND PLAN  Benzodiazepine withdrawal  Plan: Patient's labs  were dictated above. Patient had presented for benzodiazepine withdrawal. We will give a short supply to ensure she does not have a seizure. She'll be referred to [facility] for outpatient follow-up.   Earleen Newport, MD   Note: This note was generated in part or whole with voice recognition software. Voice recognition is usually quite accurate but there are transcription errors that can and very often do occur. I apologize for any typographical errors that were not detected and corrected.     Earleen Newport, MD 02/19/17 [phone]

## 2017-02-19 NOTE — ED Notes (Signed)

## 2017-02-19 NOTE — ED Triage Notes (Signed)
Pt has been on xanax for 14 years and switched to ativan recently.  Pt ran out of all benzo the end of April.  Reports her brother has been getting her a few klonopin to get her by. Has not had anything in 2 days.  Has had nausea and been shaky feeling from withdrawls.  Generalized body aches.  Her doctor is no longer working per pt and they have not filled his position.  Pt does not want detox she would like to be placed back on her benzo.  Denies SI/HI. Also would like to be seen for white productive cough that she has had for 3 weeks.  Ambulatory to triage without distress.

## 2017-02-19 NOTE — ED Notes (Signed)
Pt also c/o heart palpitations and feeling like heart racing.

## 2017-04-10 MED ORDER — CLOBETASOL 0.05 % TOPICAL CREAM
Freq: Two times a day (BID) | TOPICAL | 3 refills | 0.00000 days | Status: CP
Start: 2017-04-10 — End: 2017-10-01

## 2017-04-10 MED FILL — GABAPENTIN/300MG/CAPS: GABAPENTIN/300MG/CAPS | 14 days supply | Qty: 42 | Fill #2

## 2017-04-16 MED FILL — CLOBETASOL PROP/0.05%/CRE: CLOBETASOL PROP/0.05%/CRE | 7 days supply | Qty: 1 | Fill #0

## 2017-04-16 MED FILL — FLUTICASONE/50MCG/ACT/SUSP: FLUTICASONE/50MCG/ACT/SUSP | 30 days supply | Qty: 1 | Fill #1

## 2017-07-31 MED ORDER — METHOTREXATE SODIUM 25 MG/ML INJECTION SOLUTION
0 refills | 0 days | Status: CP
Start: 2017-07-31 — End: 2017-10-01

## 2017-07-31 MED ORDER — VITAMIN D2 1,250 MCG (50,000 UNIT) CAPSULE
ORAL_CAPSULE | 0 refills | 0 days | Status: CP
Start: 2017-07-31 — End: 2018-01-12

## 2017-07-31 MED ORDER — ERGOCALCIFEROL (VITAMIN D2) 1,250 MCG (50,000 UNIT) CAPSULE
ORAL_CAPSULE | ORAL | 0 refills | 0 days
Start: 2017-07-31 — End: 2017-07-31

## 2017-07-31 MED ORDER — METHOTREXATE SODIUM 25 MG/ML INJECTION SOLUTION: mL | 0 refills | 0 days | Status: AC

## 2017-07-31 MED ORDER — SPIRIVA WITH HANDIHALER 18 MCG AND INHALATION CAPSULES
11 refills | 0 days | Status: CP
Start: 2017-07-31 — End: 2017-07-31

## 2017-07-31 MED ORDER — TIOTROPIUM BROMIDE 18 MCG CAPSULE WITH INHALATION DEVICE
ORAL_CAPSULE | 11 refills | 0 days | Status: CP
Start: 2017-07-31 — End: 2018-08-26

## 2017-07-31 MED FILL — CLOBETASOL PROPION/0.05%/CREA: CLOBETASOL PROPION/0.05%/CREA | 30 days supply | Qty: 3 | Fill #1

## 2017-07-31 MED FILL — PROVENTIL HFA//AERS: PROVENTIL HFA//AERS | 100 days supply | Qty: 8 | Fill #1

## 2017-07-31 MED FILL — GABAPENTIN/300MG/CAPS: GABAPENTIN/300MG/CAPS | 30 days supply | Qty: 90 | Fill #0

## 2017-07-31 MED FILL — FLUTICASONE/50MCG/ACT/SUSP: FLUTICASONE/50MCG/ACT/SUSP | 90 days supply | Qty: 3 | Fill #3

## 2017-08-03 MED ORDER — METHOTREXATE SODIUM 25 MG/ML INJECTION SOLUTION
0 refills | 0.00000 days | Status: CP
Start: 2017-08-03 — End: 2017-10-01

## 2017-08-03 MED ORDER — METHOTREXATE SODIUM 25 MG/ML INJECTION SOLUTION: mL | 0 refills | 0 days | Status: AC

## 2017-08-03 MED FILL — SPIRIVA 18MCG/HANDIHLR/CAP: SPIRIVA 18MCG/HANDIHLR/CAP | 90 days supply | Qty: 3 | Fill #0

## 2017-08-03 MED FILL — VITAMIN D2/50000UNT/CAPS: VITAMIN D2/50000UNT/CAPS | 84 days supply | Qty: 12 | Fill #0

## 2017-08-05 MED FILL — METHOTREXATE/25MG/ML/SOLN: METHOTREXATE/25MG/ML/SOLN | 84 days supply | Qty: 6 | Fill #0

## 2017-08-18 ENCOUNTER — Emergency Department
Admission: EM | Admit: 2017-08-18 | Discharge: 2017-08-18 | Disposition: A | Discharge status: Home / Self Care | Payer: Self-pay | Attending: Emergency Medicine | Admitting: Emergency Medicine

## 2017-08-18 ENCOUNTER — Other Ambulatory Visit: Payer: Self-pay

## 2017-08-18 DIAGNOSIS — J449 Chronic obstructive pulmonary disease, unspecified: Secondary | ICD-10-CM | POA: Insufficient documentation

## 2017-08-18 DIAGNOSIS — R319 Hematuria, unspecified: Secondary | ICD-10-CM

## 2017-08-18 DIAGNOSIS — M5442 Lumbago with sciatica, left side: Secondary | ICD-10-CM

## 2017-08-18 DIAGNOSIS — Z79899 Other long term (current) drug therapy: Secondary | ICD-10-CM | POA: Insufficient documentation

## 2017-08-18 DIAGNOSIS — F17210 Nicotine dependence, cigarettes, uncomplicated: Secondary | ICD-10-CM | POA: Insufficient documentation

## 2017-08-18 LAB — URINALYSIS, COMPLETE (UACMP) WITH MICROSCOPIC
BILIRUBIN URINE: NEGATIVE
Bacteria, UA: NONE SEEN
GLUCOSE, UA: NEGATIVE mg/dL
KETONES UR: NEGATIVE mg/dL
Leukocytes, UA: NEGATIVE
Nitrite: NEGATIVE
Protein, ur: NEGATIVE mg/dL
Specific Gravity, Urine: 1.011 (ref 1.005–1.030)
pH: 6 (ref 5.0–8.0)

## 2017-08-18 MED ORDER — KETOROLAC TROMETHAMINE 30 MG/ML IJ SOLN
30.0000 mg | Freq: Once | INTRAMUSCULAR | Status: AC
  Administered 2017-08-18: 30 mg via INTRAMUSCULAR
  Filled 2017-08-18: qty 1

## 2017-08-18 MED ORDER — LIDOCAINE 5 % EX PTCH: 1.0000 | MEDICATED_PATCH | Freq: Two times a day (BID) | CUTANEOUS | 0 refills | Status: AC

## 2017-08-18 MED ORDER — METHYLPREDNISOLONE SODIUM SUCC 125 MG IJ SOLR
125.0000 mg | Freq: Once | INTRAMUSCULAR | Status: AC
  Administered 2017-08-18: 125 mg via INTRAMUSCULAR
  Filled 2017-08-18: qty 2

## 2017-08-18 MED ORDER — PREDNISONE 10 MG PO TABS: ORAL_TABLET | ORAL | 0 refills | Status: DC

## 2017-08-18 MED ORDER — LIDOCAINE 5 % EX PTCH
1.0000 | MEDICATED_PATCH | CUTANEOUS | Status: DC
  Administered 2017-08-18: 1 via TRANSDERMAL
  Filled 2017-08-18: qty 1

## 2017-08-18 MED ORDER — CYCLOBENZAPRINE HCL 5 MG PO TABS: ORAL_TABLET | ORAL | 0 refills | Status: DC

## 2017-08-18 NOTE — ED Triage Notes (Signed)
Pt states she is having "lumbar pain" with sciatic pain that radiates down left buttocks and left leg - she is c/o nausea

## 2017-08-18 NOTE — ED Notes (Signed)
See triage note.
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
## 2017-08-31 MED FILL — GABAPENTIN/300MG/CAPS: GABAPENTIN/300MG/CAPS | 30 days supply | Qty: 90 | Fill #1

## 2017-09-22 MED FILL — GABAPENTIN/300MG/CAPS: GABAPENTIN/300MG/CAPS | 30 days supply | Qty: 90 | Fill #2

## 2017-10-01 ENCOUNTER — Ambulatory Visit: Admit: 2017-10-01 | Discharge: 2017-10-02

## 2017-10-01 DIAGNOSIS — L4050 Arthropathic psoriasis, unspecified: Principal | ICD-10-CM

## 2017-10-01 DIAGNOSIS — M5440 Lumbago with sciatica, unspecified side: Secondary | ICD-10-CM

## 2017-10-01 DIAGNOSIS — F329 Major depressive disorder, single episode, unspecified: Secondary | ICD-10-CM

## 2017-10-01 DIAGNOSIS — G4700 Insomnia, unspecified: Secondary | ICD-10-CM

## 2017-10-01 DIAGNOSIS — M7062 Trochanteric bursitis, left hip: Secondary | ICD-10-CM

## 2017-10-01 DIAGNOSIS — F419 Anxiety disorder, unspecified: Secondary | ICD-10-CM

## 2017-10-01 DIAGNOSIS — M7061 Trochanteric bursitis, right hip: Secondary | ICD-10-CM

## 2017-10-01 DIAGNOSIS — Z79899 Other long term (current) drug therapy: Secondary | ICD-10-CM

## 2017-10-01 DIAGNOSIS — G8929 Other chronic pain: Secondary | ICD-10-CM

## 2017-10-01 MED ORDER — GABAPENTIN 300 MG CAPSULE: 300 mg | capsule | 3 refills | 0 days

## 2017-10-01 MED ORDER — CYCLOBENZAPRINE 5 MG TABLET
ORAL_TABLET | Freq: Three times a day (TID) | ORAL | 3 refills | 0.00000 days | Status: CP | PRN
Start: 2017-10-01 — End: 2017-12-30

## 2017-10-01 MED ORDER — FOLIC ACID 1 MG TABLET: tablet | 3 refills | 0 days

## 2017-10-01 MED ORDER — METHOTREXATE SODIUM 25 MG/ML INJECTION SOLUTION
SUBCUTANEOUS | 1 refills | 0.00000 days | Status: CP
Start: 2017-10-01 — End: 2018-05-13

## 2017-10-01 MED ORDER — GABAPENTIN 300 MG CAPSULE
ORAL_CAPSULE | Freq: Three times a day (TID) | ORAL | 3 refills | 0.00000 days | Status: CP
Start: 2017-10-01 — End: 2017-10-01

## 2017-10-01 MED ORDER — METHOTREXATE SODIUM 25 MG/ML INJECTION SOLUTION: 25 mg | mL | 1 refills | 0 days | Status: AC

## 2017-10-01 MED ORDER — CYCLOBENZAPRINE 5 MG TABLET: 5 mg | tablet | Freq: Three times a day (TID) | 3 refills | 0 days | Status: AC

## 2017-10-01 MED ORDER — FOLIC ACID 1 MG TABLET
ORAL_TABLET | Freq: Every day | ORAL | 3 refills | 0.00000 days | Status: CP
Start: 2017-10-01 — End: 2017-10-01

## 2017-10-01 MED ORDER — GABAPENTIN 300 MG CAPSULE: capsule | 3 refills | 0 days

## 2017-10-14 NOTE — Unmapped (Signed)
[facility] Specialty Medication Referral: No PA required    Medication (Brand/Generic): Methotrexate    Initial FSI Test Claim completed with resulted information below:  No PA required  Patient ABLE to fill at [facility]  Insurance Company:  Pharmacy Assistance Program  Anticipated Copay: $0    As Co-pay is under $100 defined limit, per policy there will be no further investigation of need for financial assistance at this time unless patient requests. This referral has been communicated to the provider and handed off to the [facility] team for further processing and filling of prescribed medication.   ______________________________________________________________________  Please utilize this referral for viewing purposes as it will serve as the central location for all relevant documentation and updates.

## 2017-10-15 NOTE — Unmapped (Signed)
[facility] - Pharmacist Onboarding Note    Specialty Medication: Subq MTX      [name] has been on subq methotrexate through the [facility] but was not formally added to [employer].  She is now enrolled in the [facility] queue.    Reaching out to patient today to onboard and complete assessment.  Patient is not available, mailbox is full and unable to leave messages.       Per refill record, she is filling methotrexate regularly with last fill on 08/05/17 for a 3 month supply.  She was last evaluated by Carlus Pavlov on 10/01/17 and was tolerating methotrexate well without side effects. Med list and allergy list updated.  Please refer to full progress notes from 1/17 for complete assessment.  Will set next refill/assessment call for 10/21/17.      Chelsea Aus

## 2017-10-16 MED FILL — FLUTICASONE/50MCG/ACT/SUSP: FLUTICASONE/50MCG/ACT/SUSP | 90 days supply | Qty: 3 | Fill #4

## 2017-10-16 MED FILL — GABAPENTIN/300MG/CAPS: GABAPENTIN/300MG/CAPS | 30 days supply | Qty: 90 | Fill #3

## 2017-10-28 NOTE — Unmapped (Signed)
[facility]: Rheumatology Clinic Assessment Call    Specialty Medication(s): SUBQ methotrexate  Indication(s): PsA      Reached out to complete clinical assessment for subq methotrexate through the [facility].  Unable to get in touch with patient and unable to leave voicemail.     3rd attempt     Michele Miller

## 2017-11-06 NOTE — Unmapped (Signed)
Received request from law office for medical records and disability paperwork. Called law office at ph. [phone] to inform them that Dr. Octavio Manns does not complete disability applications. Additionally, medical records requests should be handled through medical records department. They voiced understanding.

## 2017-11-12 MED FILL — GABAPENTIN/300MG/CAPS: GABAPENTIN/300MG/CAPS | 30 days supply | Qty: 90 | Fill #4

## 2017-11-12 MED FILL — METHOTREXATE/25MG/ML/SOLN: METHOTREXATE/25MG/ML/SOLN | 84 days supply | Qty: 6 | Fill #0

## 2017-11-12 MED FILL — CYCLOBENZAPRINE HCL/5MG/TABS: CYCLOBENZAPRINE HCL/5MG/TABS | 90 days supply | Qty: 270 | Fill #0

## 2017-11-17 MED ORDER — SYRINGE WITH NEEDLE 1 ML 25 GAUGE X 5/8"
INJECTION | 6 refills | 0.00000 days | Status: CP
Start: 2017-11-17 — End: 2018-05-13

## 2017-11-17 MED ORDER — NEEDLE (DISP) 25 GAUGE X 5/8"
6 refills | 0 days
Start: 2017-11-17 — End: 2018-11-17

## 2017-11-17 NOTE — Unmapped (Addendum)
[facility]: Rheumatology Clinic Assessment Call    Specialty Medication(s): SUBQ methotrexate  Indication(s): PsA      Reached out to complete clinical assessment for subq methotrexate through the [facility].  Unable to get in touch with patient - left voicemail requesting call back.    As this is the 5th attempts to reach patient, no further attempts will be made for assessment.  Patient did contact the [facility] on 11/12/17 and a refill for methotrexate was done for 3 month supply.    Will reschedule refill and assessment call back to 3 months later and noted that refills CANNOT be done without clinical assessment.      Chelsea Aus   11/17/17

## 2017-11-23 NOTE — Unmapped (Signed)
Called Per Aubrey's MSg to have pt scheduled with her for the sole reason of filling out disability forms. Pt is also needing to schedule a follow up w/ Dr. Sullivan Lone. no answer left vm.             Please call this pt to discuss:   I received disability forms for this patient. She must make an appointment with me for the sole reason of filling out these forms. This will not be a routine follow up appointment, so no medication changes will be made at this appointment. She needs an additional follow up appointment with Dr Sullivan Lone in the next month.   Also, please remind her that I am a PA, and although I am happy to fill this out for her, the form asks for a physician signature, so she may want to clarify with her lawyer that it is ok to get this filled out by a PA.   Thanks,   Danne Harbor

## 2017-11-23 NOTE — Unmapped (Signed)
-----   Message from Staci Righter, [place] sent at 11/23/2017  1:50 PM EDT -----  Please call this pt to discuss:  I received disability forms for this patient. She must make an appointment with me for the sole reason of filling out these forms. This will not be a routine follow up appointment, so no medication changes will be made at this appointment. She needs an additional follow up appointment with Dr Sullivan Lone in the next month.   Also, please remind her that I am a PA, and although I am happy to fill this out for her, the form asks for a physician signature, so she may want to clarify with her lawyer that it is ok to get this filled out by a PA.   Thanks,  Danne Harbor

## 2017-12-08 MED FILL — GABAPENTIN/300MG/CAPS: GABAPENTIN/300MG/CAPS | 30 days supply | Qty: 90 | Fill #5

## 2017-12-30 ENCOUNTER — Encounter: Admit: 2017-12-30 | Discharge: 2017-12-30 | Payer: MEDICARE

## 2017-12-30 ENCOUNTER — Ambulatory Visit: Admit: 2017-12-30 | Discharge: 2017-12-30 | Payer: MEDICARE

## 2017-12-30 DIAGNOSIS — L4050 Arthropathic psoriasis, unspecified: Principal | ICD-10-CM

## 2017-12-30 DIAGNOSIS — M7051 Other bursitis of knee, right knee: Secondary | ICD-10-CM

## 2017-12-30 DIAGNOSIS — M7061 Trochanteric bursitis, right hip: Secondary | ICD-10-CM

## 2017-12-30 DIAGNOSIS — Z79899 Other long term (current) drug therapy: Secondary | ICD-10-CM

## 2017-12-30 DIAGNOSIS — M797 Fibromyalgia: Secondary | ICD-10-CM

## 2017-12-30 LAB — CBC W/ AUTO DIFF
BASOPHILS ABSOLUTE COUNT: 0.1 10*9/L (ref 0.0–0.1)
EOSINOPHILS ABSOLUTE COUNT: 0.2 10*9/L (ref 0.0–0.4)
EOSINOPHILS RELATIVE PERCENT: 2.7 %
HEMATOCRIT: 43.8 % (ref 36.0–46.0)
HEMOGLOBIN: 14 g/dL (ref 12.0–16.0)
LYMPHOCYTES ABSOLUTE COUNT: 2.2 10*9/L (ref 1.5–5.0)
LYMPHOCYTES RELATIVE PERCENT: 34.1 %
MEAN CORPUSCULAR HEMOGLOBIN CONC: 31.9 g/dL (ref 31.0–37.0)
MEAN CORPUSCULAR HEMOGLOBIN: 32.5 pg (ref 26.0–34.0)
MEAN CORPUSCULAR VOLUME: 101.7 fL — ABNORMAL HIGH (ref 80.0–100.0)
MEAN PLATELET VOLUME: 7.7 fL (ref 7.0–10.0)
MONOCYTES ABSOLUTE COUNT: 0.5 10*9/L (ref 0.2–0.8)
MONOCYTES RELATIVE PERCENT: 8.1 %
NEUTROPHILS ABSOLUTE COUNT: 3.3 10*9/L (ref 2.0–7.5)
NEUTROPHILS RELATIVE PERCENT: 51.4 %
PLATELET COUNT: 340 10*9/L (ref 150–440)
RED BLOOD CELL COUNT: 4.31 10*12/L (ref 4.00–5.20)
RED CELL DISTRIBUTION WIDTH: 15.4 % — ABNORMAL HIGH (ref 12.0–15.0)
WBC ADJUSTED: 6.3 10*9/L (ref 4.5–11.0)

## 2017-12-30 LAB — AST (SGOT): Aspartate aminotransferase:CCnc:Pt:Ser/Plas:Qn:: 32

## 2017-12-30 LAB — MEAN CORPUSCULAR VOLUME: Lab: 101.7 — ABNORMAL HIGH

## 2017-12-30 LAB — ALBUMIN: Albumin:MCnc:Pt:Ser/Plas:Qn:: 4.2

## 2017-12-30 LAB — ALT (SGPT): Alanine aminotransferase:CCnc:Pt:Ser/Plas:Qn:: 40

## 2017-12-30 LAB — CREATININE
CREATININE: 0.82 mg/dL (ref 0.60–1.00)
Creatinine:MCnc:Pt:Ser/Plas:Qn:: 0.82

## 2017-12-30 MED ORDER — TIZANIDINE 4 MG TABLET: tablet | 1 refills | 0 days

## 2017-12-30 MED ORDER — TIZANIDINE 4 MG TABLET: tablet | 1 refills | 0 days | Status: AC

## 2017-12-30 MED ORDER — TIZANIDINE 4 MG TABLET
ORAL_TABLET | Freq: Three times a day (TID) | ORAL | 1 refills | 0.00000 days | Status: CP | PRN
Start: 2017-12-30 — End: 2018-11-17

## 2017-12-30 NOTE — Unmapped (Signed)
REASON FOR VISIT: F/u PsA.    HISTORY: [name] is a 57 y.o. female with hx of psoriatic arthritis. She also has clinical findings suggestive of centralized chronic pain. Was following pain management but has lost f/u due to transportation issues.   She has multiple psychiatric comorbidities including PTSD and anxiety. These have been exacerbated by her son's untimely death of her son in 97.     Interim history:  She presents today for f/u.     She notes that her pain is a lot worse today than the last visit. She hurts diffusely all over. She has not been sleeping well.      She notes a new knot on her L foot for the last 3 wks. A little tender.     She has been irritable at work due to pain.    She also notes numbness on the R foot when crossing her leg    She had some relief with steroid shots of the hips, and would like these again .      She is currently applying for disability and will have a hearing next week.     CURRENT MEDICATIONS:  Current Outpatient Medications   Medication Sig Dispense Refill   ??? albuterol (PROVENTIL HFA;VENTOLIN HFA) 90 mcg/actuation inhaler Inhale 2 puffs every six (6) hours as needed for wheezing. 3 Inhaler 6   ??? cetirizine (ZYRTEC) 10 MG tablet Take 10 mg by mouth daily. Frequency:QD   Dosage:10   MG  Instructions:  Note:Dose: 10MG      ??? citalopram (CELEXA) 20 MG tablet Take 1 tablet (20 mg total) by mouth daily. 90 tablet 3   ??? clobetasol (TEMOVATE) 0.05 % ointment APPLY TOPICALLY TWICE DAILY 30 g PRN   ??? cyclobenzaprine (FLEXERIL) 5 MG tablet Take 1 tablet (5 mg total) by mouth Three (3) times a day as needed for muscle spasms. 270 tablet 3   ??? diclofenac sodium (VOLTAREN) 1 % gel Apply 2 g topically Four (4) times a day. 100 g 0   ??? famotidine (PEPCID) 40 MG tablet Take 1 tablet (40 mg total) by mouth every evening. 360 tablet 0   ??? fluticasone (FLONASE) 50 mcg/actuation nasal spray USE 2 SPRAYS IN EACH NOSTRIL DAILY 48 g 3   ??? folic acid (FOLVITE) 1 MG tablet Take 1 tablet (1,000 mcg total) by mouth daily. 90 tablet 3   ??? gabapentin (NEURONTIN) 300 MG capsule Take 1 capsule (300 mg total) by mouth Three (3) times a day. 270 capsule 3   ??? lactulose (CHRONULAC) 20 gram/30 mL Soln Take 20 grams (30 mL) daily as needed for constipation. Can increase to 2-3 times a day if once a day is ineffective. 1800 mL 0   ??? methotrexate 25 mg/mL injection solution Inject 1 mL (25 mg total) under the skin every seven (7) days. 12 mL 1   ??? polyethylene glycol (MIRALAX) 17 gram packet Take 17 g by mouth daily. 10 packet 0   ??? SPIRIVA WITH HANDIHALER 18 mcg inhalation capsule PLACE 1 CAPSULE IN HANDIHALER AND INHALE ONCE DAILY 2 each 11   ??? syringe with needle (TUBERCULIN SYRINGE) 1 mL 25 gauge x 5/8 Syrg 1 Units by Miscellaneous route every seven (7) days. To be used with methotrexate 12 Syringe 6   ??? VITAMIN D2 50,000 unit capsule TAKE 1 CAPSULE BY MOUTH ONCE EACH WEEK 12 capsule 0     No current facility-administered medications for this visit.  Past Medical History:   Diagnosis Date   ??? Arthritis    ??? Baker's cyst    ??? COPD (chronic obstructive pulmonary disease) (CMS-HCC)    ??? CTS (carpal tunnel syndrome)    ??? Depression    ??? Headache(784.0)    ??? Joint pain    ??? Psoriasis    ??? PTSD (post-traumatic stress disorder)    ??? Scoliosis      Past Surgical History:   Procedure Laterality Date   ??? CARPAL TUNNEL RELEASE     ??? COLONOSCOPY     ??? HYSTERECTOMY      For endometriosis, ovaries still in place   ??? KNEE SURGERY     ??? PR REVISE MEDIAN N/CARPAL TUNNEL SURG Right 02/13/2014    Procedure: NEUROPLASTY AND/OR TRANSPOSITION; MEDIAN NERVE AT CARPAL TUNNEL;  Surgeon: Abram Sander, MD;  Location: [facility];  Service: Orthopedics      Record Review: Available records were reviewed, including pertinent office visits, labs, and imaging.      REVIEW OF SYSTEMS: Ten system were reviewed and negative except as noted above.    PHYSICAL EXAM:  VITAL SIGNS:   Vitals:    12/30/17 1503   BP: 117/77   BP Site: L Arm   BP Position: Sitting   BP Cuff Size: Medium   Pulse: 89   Temp: 36.4 ??C (97.5 ??F)   TempSrc: Oral   Weight: 66 kg (145 lb 8.1 oz)       General:   Pleasant 56 y.o.female in no acute distress, WDWN   Eyes:   PERRL, conjunctiva and sclera not inflamed. Tears appear adequate.    ENT:   No oropharyngeal lesions. Mucous membranes moist.    Lymph:   No masses or cervical lymphadenopathy.    Cardiovascular:  Regular rate and rhythm. No murmur, rub, or gallop. No lower extremity edema.    Lungs:  Clear to auscultation.Normal respiratory effort.    Musculoskeletal:   General: Ambulates w/o assistance,  +16/18 Tender points on exam   Hands: Globally tender without synovitis.  Grip intact.   Wrists: FROM w/o synovitis.   Elbows:FROM b/l w/o effusion, tenderness bilaterally  Shoulders: FROM w/ pain  Spine: Occiput to wall 0 CM. Increased trapezius tone b/l, tender paraspinal muscles diffusely. No pain in SI joints with FABER.   Hips: FROM with pain, tender b/l trochateric bursa worse on R   Knees: FROM b/l w/o effusion, painful range of motion bilaterally, tenderness of R pes anserine bursa  Ankles: No effusion.  Tenderness bilaterally, globally.  Feet: No dactylitis. Pain with MTP squeeze b/l. Hard nodule on bottom of L foot under arch. Minimally tender.    Neurological:  CN 2-12 grossly intact. 5/5 strength on extremities.   Psych:  Appropriate affect and mood   Skin:   no psoriatic rashes. No nail pitting.              ASSESSMENT/PLAN:  1. Psoriatic arthritis (CMS-HCC)  Still w/o evidence for active inflammatory arthritis. No skin psoriasis noted on exam. Would not escalate immunosuppressive therapy. Continue mtx 25 mg sq qwk, FA 1 mg qd. Update hand and foot x rays. Also looking for bony changes on the L foot x ray to explain the new nodule.   - AMBULATORY REFERRAL TO PAIN PSYCHOLOGY; Future  - XR Hand 2 Views Bilateral; Future  - XR Foot 3 Or More Views Bilateral; Future    2. Fibromyalgia  Again, this seems to be  the etiology for her continued pain. Discussed the importance of graded cardiovascular exercise, good restorative sleep, and management of comorbid psychiatric conditions. She will discuss insomnia with her PCP. I have referred her to pain psychology.   - AMBULATORY REFERRAL TO PAIN PSYCHOLOGY; Future  - tiZANidine (ZANAFLEX) 4 MG tablet; Take 1 tablet (4 mg total) by mouth Three (3) times a day as needed.  Dispense: 270 tablet; Refill: 1    3. Methotrexate, long term, current use  Checking labs below to evaluate for medication toxicity.    - Albumin; Future  - AST; Future  - ALT; Future  - CBC w/ Differential; Future  - Creatinine; Future      4. Trochanteric bursitis of right hip  R trochanteric bursa injection, see procedure note below.   - methylPREDNISolone acetate (DEPO-MEDROL) injection 80 mg  - lidocaine (XYLOCAINE) 10 mg/mL (1 %) injection 2 mL    5. Pes anserinus bursitis of right knee  R knee pes anserine bursa injection, see procedure note below   - methylPREDNISolone acetate (DEPO-MEDROL) injection 40 mg  - lidocaine (XYLOCAINE) 10 mg/mL (1 %) injection 1 mL    Procedure Note:  The risks of injections to include infection, mechanical damage, and steroid reaction were discussed. After obtaining verbal consent, the appropriate time-out was performed and under sterile conditions we injected 80 mg of Depomedrol mixed with 2 cc of 1% lidocaine into the R trochanteric bursa. We also injected 40 mg of Depomedrol mixed with 1 cc of 1% lidocaine into the R knee pes anserine bursa.  The patient tolerated the procedure well and without apparent complications.      HCM:   - PCV13 Status: Did not discuss today  - PPSV 23 Status: 11/03/2013  - TdaP (Booster once every 10 years). Status:  - Annual Influenza vaccine. Status: 06/23/2016, declines to update  - Bone health: not on prednisone   - Contraception: Status post hysterectomy    Return appointment in 4 mo with myself and 8 mo with Dr Delton See    45 minutes was spent with the patient, over half of which was counseling regarding dx and treatment plan.

## 2017-12-31 MED FILL — FLUTICASONE/50MCG/ACT/SUSP: FLUTICASONE/50MCG/ACT/SUSP | 60 days supply | Qty: 2 | Fill #5

## 2017-12-31 MED FILL — METHOTREXATE/25MG/ML/SOLN: METHOTREXATE/25MG/ML/SOLN | 84 days supply | Qty: 6 | Fill #1

## 2017-12-31 MED FILL — GABAPENTIN/300MG/CAPS: GABAPENTIN/300MG/CAPS | 30 days supply | Qty: 90 | Fill #6

## 2018-01-07 MED FILL — TIZANIDINE HCL/4MG/TABS: TIZANIDINE HCL/4MG/TABS | 90 days supply | Qty: 270 | Fill #0

## 2018-01-08 NOTE — Unmapped (Signed)
Refill request; unsure if you are going to continue medication.

## 2018-01-08 NOTE — Unmapped (Signed)
Completed course of ergocalciferol, does not need this any more.

## 2018-01-11 ENCOUNTER — Encounter: Payer: Self-pay | Admitting: Emergency Medicine

## 2018-01-11 ENCOUNTER — Other Ambulatory Visit: Payer: Self-pay

## 2018-01-11 ENCOUNTER — Emergency Department
Admission: EM | Admit: 2018-01-11 | Discharge: 2018-01-12 | Disposition: A | Discharge status: Home / Self Care | Payer: Self-pay | Attending: Emergency Medicine | Admitting: Emergency Medicine

## 2018-01-11 DIAGNOSIS — R45851 Suicidal ideations: Secondary | ICD-10-CM | POA: Insufficient documentation

## 2018-01-11 DIAGNOSIS — F17210 Nicotine dependence, cigarettes, uncomplicated: Secondary | ICD-10-CM | POA: Insufficient documentation

## 2018-01-11 DIAGNOSIS — F329 Major depressive disorder, single episode, unspecified: Secondary | ICD-10-CM | POA: Insufficient documentation

## 2018-01-11 DIAGNOSIS — F32A Depression, unspecified: Secondary | ICD-10-CM

## 2018-01-11 DIAGNOSIS — F4310 Post-traumatic stress disorder, unspecified: Secondary | ICD-10-CM

## 2018-01-11 DIAGNOSIS — F332 Major depressive disorder, recurrent severe without psychotic features: Secondary | ICD-10-CM

## 2018-01-11 DIAGNOSIS — J449 Chronic obstructive pulmonary disease, unspecified: Secondary | ICD-10-CM | POA: Insufficient documentation

## 2018-01-11 DIAGNOSIS — M797 Fibromyalgia: Secondary | ICD-10-CM

## 2018-01-11 DIAGNOSIS — Z79899 Other long term (current) drug therapy: Secondary | ICD-10-CM | POA: Insufficient documentation

## 2018-01-11 HISTORY — DX: Fibromyalgia: M79.7

## 2018-01-11 LAB — URINE DRUG SCREEN, QUALITATIVE (ARMC ONLY)
Amphetamines, Ur Screen: NOT DETECTED
BARBITURATES, UR SCREEN: NOT DETECTED
BENZODIAZEPINE, UR SCRN: NOT DETECTED
CANNABINOID 50 NG, UR ~~LOC~~: POSITIVE — AB
Cocaine Metabolite,Ur ~~LOC~~: NOT DETECTED
MDMA (Ecstasy)Ur Screen: NOT DETECTED
Methadone Scn, Ur: NOT DETECTED
OPIATE, UR SCREEN: NOT DETECTED
PHENCYCLIDINE (PCP) UR S: NOT DETECTED
Tricyclic, Ur Screen: NOT DETECTED

## 2018-01-11 LAB — COMPREHENSIVE METABOLIC PANEL
ALBUMIN: 4.2 g/dL (ref 3.5–5.0)
ALK PHOS: 88 U/L (ref 38–126)
ALT: 99 U/L — ABNORMAL HIGH (ref 14–54)
AST: 81 U/L — AB (ref 15–41)
Anion gap: 11 (ref 5–15)
BILIRUBIN TOTAL: 0.3 mg/dL (ref 0.3–1.2)
BUN: 14 mg/dL (ref 6–20)
CALCIUM: 9.2 mg/dL (ref 8.9–10.3)
CO2: 23 mmol/L (ref 22–32)
Chloride: 103 mmol/L (ref 101–111)
Creatinine, Ser: 0.78 mg/dL (ref 0.44–1.00)
GFR calc Af Amer: >60 mL/min (ref >60)
GLUCOSE: 107 mg/dL — AB (ref 65–99)
POTASSIUM: 3.8 mmol/L (ref 3.5–5.1)
Sodium: 137 mmol/L (ref 135–145)
TOTAL PROTEIN: 7.5 g/dL (ref 6.5–8.1)

## 2018-01-11 LAB — CBC
HEMATOCRIT: 40.1 % (ref 35.0–47.0)
Hemoglobin: 13.7 g/dL (ref 12.0–16.0)
MCH: 33.9 pg (ref 26.0–34.0)
MCHC: 34.1 g/dL (ref 32.0–36.0)
MCV: 99.3 fL (ref 80.0–100.0)
PLATELETS: 283 10*3/uL (ref 150–440)
RBC: 4.04 MIL/uL (ref 3.80–5.20)
RDW: 15.3 % — AB (ref 11.5–14.5)
WBC: 8.9 10*3/uL (ref 3.6–11.0)

## 2018-01-11 LAB — SALICYLATE LEVEL: Salicylate Lvl: <7 mg/dL (ref 2.8–30.0)

## 2018-01-11 LAB — URINALYSIS, COMPLETE (UACMP) WITH MICROSCOPIC
Bacteria, UA: NONE SEEN
Bilirubin Urine: NEGATIVE
Glucose, UA: NEGATIVE mg/dL
Ketones, ur: NEGATIVE mg/dL
Leukocytes, UA: NEGATIVE
Nitrite: NEGATIVE
PH: 6 (ref 5.0–8.0)
Protein, ur: NEGATIVE mg/dL
SPECIFIC GRAVITY, URINE: 1.002 — AB (ref 1.005–1.030)
Squamous Epithelial / LPF: NONE SEEN (ref 0–5)

## 2018-01-11 LAB — ACETAMINOPHEN LEVEL: Acetaminophen (Tylenol), Serum: <10 ug/mL — ABNORMAL LOW (ref 10–30)

## 2018-01-11 LAB — ETHANOL

## 2018-01-11 MED ORDER — LORAZEPAM 2 MG PO TABS
2.0000 mg | ORAL_TABLET | Freq: Once | ORAL | Status: AC
  Administered 2018-01-11: 2 mg via ORAL
  Filled 2018-01-11: qty 1

## 2018-01-11 NOTE — ED Notes (Signed)
Pt reports hx of ptsd related to death of her son and her father. Today pt reports one of her new kittens died and "I don't like death." When asked if she wanted to harm herself pt says no but goes on to report she has "a 39 but I would probably mess up and just be paralyzed if I shot myself".

## 2018-01-11 NOTE — ED Notes (Signed)
BEHAVIORAL HEALTH ROUNDING  Patient sleeping: No.  Patient alert and oriented: yes  Behavior appropriate: Yes. ; If no, describe:  Nutrition and fluids offered: Yes  Toileting and hygiene offered: Yes  Sitter present: not applicable, Q 15 min safety rounds and observation.  Law enforcement present: Yes ODS  

## 2018-01-11 NOTE — ED Notes (Signed)

## 2018-01-11 NOTE — ED Triage Notes (Addendum)
Pt presents to ED with suicidal thoughts and expressing hopelessness and despair. Pt crying inconsolably during triage. Pt plan is to shoot herself with her personal gun. States "with my luck I wouldn't kill myself and I would end up paralyzed instead".  Pt states she has also been having bladder pain and blood in her urine for several weeks. Has not followed up with urology.

## 2018-01-11 NOTE — ED Provider Notes (Signed)
[facility] Emergency Department Provider Note  ____________________________________________   First MD Initiated Contact with Patient 01/11/18 1926     (approximate)  I have reviewed the triage vital signs and the nursing notes.   HISTORY  Chief Complaint Psychiatric Evaluation and Suicidal   HPI Emily Johnston is a 57 y.o. female who comes to the emergency department with increasing depression.  Her symptoms have been exacerbated recently by the death of her kitten which is reminded her of the death of her child remotely.  Her depression is now moderate to severe.  She does express suicidal ideation.  Nothing seems to make her symptoms better.  She reports compliance with her prescribed Effexor.  She had previously taken benzodiazepines daily although has not taken any in several months.  Past Medical History:  Diagnosis Date  . Anxiety   . Arthritis   . COPD (chronic obstructive pulmonary disease) ([place])   . Depression   . Fibromyalgia   . PTSD (post-traumatic stress disorder)     There are no active problems to display for this patient.   Past Surgical History:  Procedure Laterality Date  . ABDOMINAL HYSTERECTOMY    . CARPAL TUNNEL RELEASE    . KNEE SURGERY      Prior to Admission medications   Medication Sig Start Date End Date Taking? Authorizing Provider  ALPRAZolam Duanne Moron) 1 MG tablet Take 1 mg by mouth at bedtime as needed for anxiety.    [provider]  ALPRAZolam Duanne Moron) 1 MG tablet Take 1 tablet (1 mg total) by mouth 3 (three) times daily. 02/19/17 02/19/18  Earleen Newport, MD  cyclobenzaprine (FLEXERIL) 5 MG tablet Take 1-2 tablets 3 times daily as needed 08/18/17   Laban Emperor, PA-C  HYDROcodone-acetaminophen (NORCO) 5-325 MG tablet Take 2 tablets by mouth every 6 (six) hours as needed for moderate pain. 11/03/16   Merlyn Lot, MD  HYDROcodone-acetaminophen (NORCO/VICODIN) 5-325 MG tablet Take 1 tablet by  mouth every 4 (four) hours as needed for moderate pain. 09/11/16   Cuthriell, Charline Bills, PA-C  lidocaine (LIDODERM) 5 % Place 1 patch onto the skin every 12 (twelve) hours. Remove & Discard patch within 12 hours or as directed by MD 08/18/17 08/18/18  Laban Emperor, PA-C  meloxicam (MOBIC) 15 MG tablet Take 1 tablet (15 mg total) by mouth daily. 09/11/16   Cuthriell, Charline Bills, PA-C  methotrexate (50 MG/ML) 1 g injection Inject into the vein once.    [provider]  polyethylene glycol (MIRALAX / GLYCOLAX) packet Take 17 g by mouth daily. Mix one tablespoon with 8oz of your favorite juice or water every day until you are having soft formed stools. Then start taking once daily if you didn't have a stool the day before. 11/03/16   Merlyn Lot, MD  predniSONE (DELTASONE) 10 MG tablet Take 6 tablets on day 1, take 5 tablets on day 2, take 4 tablets on day 3, take 3 tablets on day 4, take 2 tablets on day 5, take 1 tablet on day 6 08/18/17   Laban Emperor, PA-C  venlafaxine (EFFEXOR) 50 MG tablet Take 50 mg by mouth 2 (two) times daily.    [provider]    Allergies Patient has no known allergies.  History reviewed. No pertinent family history.  Social History Social History   Tobacco Use  . Smoking status: Current Every Day Smoker    Types: Cigarettes  . Smokeless tobacco: Never Used  Substance Use Topics  .  Alcohol use: No  . Drug use: No    Review of Systems Constitutional: No fever/chills Eyes: No visual changes. ENT: No sore throat. Cardiovascular: Denies chest pain. Respiratory: Denies shortness of breath. Gastrointestinal: No abdominal pain.  No nausea, no vomiting.  No diarrhea.  No constipation. Genitourinary: Negative for dysuria. Musculoskeletal: Negative for back pain. Skin: Negative for rash. Neurological: Negative for headaches, focal weakness or numbness.   ____________________________________________   PHYSICAL EXAM:  VITAL SIGNS: ED  Triage Vitals  Enc Vitals Group     BP 01/11/18 1907 (!) 150/102     Pulse Rate 01/11/18 1907 (!) 101     Resp 01/11/18 1907 18     Temp 01/11/18 1907 98.1 F (36.7 C)     Temp Source 01/11/18 1907 Oral     SpO2 01/11/18 1907 94 %     Weight 01/11/18 1908 140 lb (63.5 kg)     Height 01/11/18 1908 5\' 4"  (1.626 m)     Head Circumference --      Peak Flow --      Pain Score 01/11/18 1908 6     Pain Loc --      Pain Edu? --      Excl. in [place]? --     Constitutional: Tearful although cooperative and appropriate Eyes: PERRL EOMI. Head: Atraumatic. Nose: No congestion/rhinnorhea. Mouth/Throat: No trismus Neck: No stridor.   Cardiovascular: Normal rate, regular rhythm. Grossly normal heart sounds.  Good peripheral circulation. Respiratory: Normal respiratory effort.  No retractions. Lungs CTAB and moving good air Gastrointestinal:  Musculoskeletal: No lower extremity edema   Neurologic:  Normal speech and language. No gross focal neurologic deficits are appreciated. Skin:  Skin is warm, dry and intact. No rash noted. Psychiatric: Depressed affect    ____________________________________________   DIFFERENTIAL includes but not limited to  Suicidal ideation, suicide attempt, drug abuse, depression ____________________________________________   LABS (all labs ordered are listed, but only abnormal results are displayed)  Labs Reviewed  COMPREHENSIVE METABOLIC PANEL - Abnormal; Notable for the following components:      Result Value   Glucose, Bld 107 (*)    AST 81 (*)    ALT 99 (*)    All other components within normal limits  ACETAMINOPHEN LEVEL - Abnormal; Notable for the following components:   Acetaminophen (Tylenol), Serum <10 (*)    All other components within normal limits  CBC - Abnormal; Notable for the following components:   RDW 15.3 (*)    All other components within normal limits  URINE DRUG SCREEN, QUALITATIVE ([facility] ONLY) - Abnormal; Notable for the following  components:   Cannabinoid 50 Ng, Ur [place] POSITIVE (*)    All other components within normal limits  URINALYSIS, COMPLETE (UACMP) WITH MICROSCOPIC - Abnormal; Notable for the following components:   Color, Urine COLORLESS (*)    APPearance CLEAR (*)    Specific Gravity, Urine 1.002 (*)    Hgb urine dipstick MODERATE (*)    All other components within normal limits  ETHANOL  SALICYLATE LEVEL    Lab work reviewed by me cannabis positive otherwise unremarkable __________________________________________  EKG   ____________________________________________  RADIOLOGY   ____________________________________________   PROCEDURES  Procedure(s) performed: no  Procedures  Critical Care performed: no  Observation: no ____________________________________________   INITIAL IMPRESSION / ASSESSMENT AND PLAN / ED COURSE  Pertinent labs & imaging results that were available during my care of the patient were reviewed by me and considered in my medical decision  making (see chart for details).  The patient consents to stay and be treated by a psychiatrist.  No indication for involuntary commitment at this time.  Lab work with no acute disease.  She agreed to 2 mg of lorazepam orally which improved her symptoms.  Psychiatric consultation is pending.      ____________________________________________   FINAL CLINICAL IMPRESSION(S) / ED DIAGNOSES  Final diagnoses:  Depression, unspecified depression type  Suicidal ideation      NEW MEDICATIONS STARTED DURING THIS VISIT:  New Prescriptions   No medications on file     Note:  This document was prepared using Dragon voice recognition software and may include unintentional dictation errors.     Darel Hong, MD 01/12/18 [phone]

## 2018-01-12 DIAGNOSIS — M797 Fibromyalgia: Secondary | ICD-10-CM

## 2018-01-12 DIAGNOSIS — F332 Major depressive disorder, recurrent severe without psychotic features: Secondary | ICD-10-CM

## 2018-01-12 DIAGNOSIS — F4310 Post-traumatic stress disorder, unspecified: Secondary | ICD-10-CM

## 2018-01-12 MED ORDER — ERGOCALCIFEROL (VITAMIN D2) 1,250 MCG (50,000 UNIT) CAPSULE
ORAL_CAPSULE | ORAL | 0 refills | 0.00000 days | Status: CP
Start: 2018-01-12 — End: 2018-01-12

## 2018-01-12 MED ORDER — ERGOCALCIFEROL (VITAMIN D2) 1,250 MCG (50,000 UNIT) CAPSULE: 1 | capsule | 0 refills | 0 days | Status: AC

## 2018-01-12 MED ORDER — LORAZEPAM 2 MG PO TABS
2.0000 mg | ORAL_TABLET | Freq: Once | ORAL | Status: AC
  Administered 2018-01-12: 2 mg via ORAL
  Filled 2018-01-12: qty 1

## 2018-01-12 NOTE — Unmapped (Signed)
Refill request

## 2018-01-12 NOTE — ED Notes (Signed)
Multiple attempts made by patient to access the call out number at patients place of employment to no avail. Cell phone shut down and placed in belongings bag.

## 2018-01-12 NOTE — ED Notes (Addendum)
Pt awake, calm, in room. C/o bladder pain that has been going on for "months". States that she has blood on and off in urine. Denies following up with PCP.   Follow-up: talked to Dr Kerman Passey about pts bladder symptoms. He recommended that pt follow-up with pcp. He also ordered urine culture. I notified pt of same and told her that if culture came back positive then someone would notify her and call in an antibiotic if needed.

## 2018-01-12 NOTE — ED Notes (Signed)
BEHAVIORAL HEALTH ROUNDING Patient sleeping: Yes.   Patient alert and oriented: not applicable SLEEPING Behavior appropriate: Yes.  ; If no, describe: SLEEPING Nutrition and fluids offered: No SLEEPING Toileting and hygiene offered: NoSLEEPING Sitter present: not applicable, Q 15 min safety rounds and observation. Law enforcement present: Yes ODS 

## 2018-01-12 NOTE — ED Provider Notes (Signed)
-----------------------------------------   4:23 PM on 01/12/2018 -----------------------------------------  Patient has been seen and evaluated by psychiatry.  They believe the patient is a for discharge home from a psychiatric standpoint.  Patient's medical work-up is been largely nonrevealing.  Was complaining of blood in her urine, moderate on dipstick no definitive cause we will add a urine culture but given no white blood cells or bacteria we will hold off on treatment.  Patient is to follow-up with her doctor.   Harvest Dark, MD 01/12/18 [phone]

## 2018-01-12 NOTE — ED Notes (Signed)
PT VOLUNTARY PENDING PLACEMENT. 

## 2018-01-12 NOTE — ED Notes (Signed)
Patient was irritable this morning and wanted to get out from here.Ativan given.Patient calm down and waiting for MD now.

## 2018-01-12 NOTE — ED Notes (Signed)
Hourly rounding reveals patient sleeping in room. No complaints, stable, in no acute distress. Q15 minute rounds and monitoring via Security Cam[employer] to continue. 

## 2018-01-12 NOTE — ED Notes (Signed)
Hourly rounding reveals patient in room. No complaints, stable, in no acute distress. Q15 minute rounds and monitoring via Security Cameras to continue. 

## 2018-01-12 NOTE — ED Notes (Signed)
Hourly rounding reveals patient in room. Stable, in no acute distress. Q15 minute rounds and monitoring via Security Cameras to continue. 

## 2018-01-12 NOTE — Consult Note (Signed)
[facility] Consult   Reason for Consult: Consult for 57 year old woman with a history of depression and PTSD who came to the emergency room voluntarily last night Referring Physician: Corky Downs Patient Identification: Emily Johnston MRN:  [id number] Principal Diagnosis: Severe recurrent major depression without psychotic features [facility]) Diagnosis:   Patient Active Problem List   Diagnosis Date Noted  . Severe recurrent major depression without psychotic features ([place]) [F33.2] 01/12/2018  . PTSD (post-traumatic stress disorder) [F43.10] 01/12/2018  . Fibromyalgia [M79.7] 01/12/2018    Total Time spent with patient: 1 hour  Subjective:   ANDREY HOOBLER is a 57 y.o. female patient admitted with "I had a very bad day".  HPI: Patient interviewed chart reviewed.  57 year old woman who came to the emergency room last night tearful agitated very upset.  She tells me that her depression and anxiety have been bad recently and building up on her.  She has chronic stress from multiple sources including the death of her son several years ago, financial problems, isolation, financial difficulties.  Yesterday 1 of her kittens died which was overwhelming for her because it triggered memories of her son.  Patient is not receiving any psychiatric treatment currently.  She came in last night making comments about being suicidal.  No evidence that she tried to hurt her self.  Patient now absolutely denies any suicidal thought intent or plan.  Denies any hallucinations.  Says that she has several positive things in her life to live for especially her grandchildren.  Medical history: Rheumatoid arthritis.  Gets methotrexate weekly.  Apparently he used to be seeing rheumatology in [facility].  Social history: Patient feels she has little or no support locally.  Works at a [employer] job.  Closest person in her life is her late son's girlfriend in [place].  Substance abuse history:  Patient denies alcohol or drug use although her drug screen is positive for marijuana.  Patient has a past history of overuse of benzodiazepines.  Past Psychiatric History: Past history of recurrent depression and anxiety.  Over dependence on benzodiazepines.  Diagnosis also of PTSD.  Previous medications have included Prozac and Effexor.  Patient tells me that she would prefer to take the Effexor which she already has at home.  Denies any past suicide attempts or hospitalizations.  Risk to Self: Is patient at risk for suicide?: Yes Risk to Others:   Prior Inpatient Therapy:   Prior Outpatient Therapy:    Past Medical History:  Past Medical History:  Diagnosis Date  . Anxiety   . Arthritis   . COPD (chronic obstructive pulmonary disease) ([place])   . Depression   . Fibromyalgia   . PTSD (post-traumatic stress disorder)     Past Surgical History:  Procedure Laterality Date  . ABDOMINAL HYSTERECTOMY    . CARPAL TUNNEL RELEASE    . KNEE SURGERY     Family History: History reviewed. No pertinent family history. Family Psychiatric  History: None known Social History:  Social History   Substance and Sexual Activity  Alcohol Use No     Social History   Substance and Sexual Activity  Drug Use No    Social History   Socioeconomic History  . Marital status: Legally Separated    Spouse name: Not on file  . Number of children: Not on file  . Years of education: Not on file  . Highest education level: Not on file  Occupational History  . Not on file  Social Needs  .  Financial resource strain: Not on file  . Food insecurity:    Worry: Not on file    Inability: Not on file  . Transportation needs:    Medical: Not on file    Non-medical: Not on file  Tobacco Use  . Smoking status: Current Every Day Smoker    Types: Cigarettes  . Smokeless tobacco: Never Used  Substance and Sexual Activity  . Alcohol use: No  . Drug use: No  . Sexual activity: Not on file  Lifestyle  .  Physical activity:    Days per week: Not on file    Minutes per session: Not on file  . Stress: Not on file  Relationships  . Social connections:    Talks on phone: Not on file    Gets together: Not on file    Attends religious service: Not on file    Active member of club or organization: Not on file    Attends meetings of clubs or organizations: Not on file    Relationship status: Not on file  Other Topics Concern  . Not on file  Social History Narrative  . Not on file   Additional Social History:    Allergies:  No Known Allergies  Labs:  Results for orders placed or performed during the hospital encounter of 01/11/18 (from the past 48 hour(s))  Comprehensive metabolic panel     Status: Abnormal   Collection Time: 01/11/18  7:15 PM  Result Value Ref Range   Sodium 137 135 - 145 mmol/L   Potassium 3.8 3.5 - 5.1 mmol/L   Chloride 103 101 - 111 mmol/L   CO2 23 22 - 32 mmol/L   Glucose, Bld 107 (H) 65 - 99 mg/dL   BUN 14 6 - 20 mg/dL   Creatinine, Ser 0.78 0.44 - 1.00 mg/dL   Calcium 9.2 8.9 - 10.3 mg/dL   Total Protein 7.5 6.5 - 8.1 g/dL   Albumin 4.2 3.5 - 5.0 g/dL   AST 81 (H) 15 - 41 U/L   ALT 99 (H) 14 - 54 U/L   Alkaline Phosphatase 88 38 - 126 U/L   Total Bilirubin 0.3 0.3 - 1.2 mg/dL   GFR calc non Af Amer >60 >60 mL/min   GFR calc Af Amer >60 >60 mL/min    Comment: (NOTE) The eGFR has been calculated using the CKD EPI equation. This calculation has not been validated in all clinical situations. eGFR's persistently <60 mL/min signify possible Chronic Kidney Disease.    Anion gap 11 5 - 15    Comment: Performed at [facility] [address] [place], [place] [address]  Ethanol     Status: None   Collection Time: 01/11/18  7:15 PM  Result Value Ref Range   Alcohol, Ethyl (B) <10 <10 mg/dL    Comment:        LOWEST DETECTABLE LIMIT FOR SERUM ALCOHOL IS 10 mg/dL FOR MEDICAL PURPOSES ONLY Performed at [facility], [address] [place], [place] [address]   Salicylate level     Status: None   Collection Time: 01/11/18  7:15 PM  Result Value Ref Range   Salicylate Lvl <0.3 2.8 - 30.0 mg/dL    Comment: Performed at [facility], [address] [place], [place] [address]  Acetaminophen level     Status: Abnormal   Collection Time: 01/11/18  7:15 PM  Result Value Ref Range   Acetaminophen (Tylenol), Serum <10 (L) 10 - 30 ug/mL  Comment:        THERAPEUTIC CONCENTRATIONS VARY SIGNIFICANTLY. A RANGE OF 10-30 ug/mL MAY BE AN EFFECTIVE CONCENTRATION FOR MANY PATIENTS. HOWEVER, SOME ARE BEST TREATED AT CONCENTRATIONS OUTSIDE THIS RANGE. ACETAMINOPHEN CONCENTRATIONS >150 ug/mL AT 4 HOURS AFTER INGESTION AND >50 ug/mL AT 12 HOURS AFTER INGESTION ARE OFTEN ASSOCIATED WITH TOXIC REACTIONS. Performed at [facility], [address] [place], [place] [address]   cbc     Status: Abnormal   Collection Time: 01/11/18  7:15 PM  Result Value Ref Range   WBC 8.9 3.6 - 11.0 K/uL   RBC 4.04 3.80 - 5.20 MIL/uL   Hemoglobin 13.7 12.0 - 16.0 g/dL   HCT 40.1 35.0 - 47.0 %   MCV 99.3 80.0 - 100.0 fL   MCH 33.9 26.0 - 34.0 pg   MCHC 34.1 32.0 - 36.0 g/dL   RDW 15.3 (H) 11.5 - 14.5 %   Platelets 283 150 - 440 K/uL    Comment: Performed at [facility], [address], [place], [place] [address]  Urine Drug Screen, Qualitative     Status: Abnormal   Collection Time: 01/11/18  7:16 PM  Result Value Ref Range   Tricyclic, Ur Screen NONE DETECTED NONE DETECTED   Amphetamines, Ur Screen NONE DETECTED NONE DETECTED   MDMA (Ecstasy)Ur Screen NONE DETECTED NONE DETECTED   Cocaine Metabolite,Ur [place] NONE DETECTED NONE DETECTED   Opiate, Ur Screen NONE DETECTED NONE DETECTED   Phencyclidine (PCP) Ur S NONE DETECTED NONE DETECTED   Cannabinoid 50 Ng, Ur [place] POSITIVE (A) NONE DETECTED   Barbiturates, Ur Screen NONE DETECTED NONE DETECTED   Benzodiazepine, Ur Scrn NONE DETECTED NONE DETECTED   Methadone  Scn, Ur NONE DETECTED NONE DETECTED    Comment: (NOTE) Tricyclics + metabolites, urine    Cutoff 1000 ng/mL Amphetamines + metabolites, urine  Cutoff 1000 ng/mL MDMA (Ecstasy), urine              Cutoff 500 ng/mL Cocaine Metabolite, urine          Cutoff 300 ng/mL Opiate + metabolites, urine        Cutoff 300 ng/mL Phencyclidine (PCP), urine         Cutoff 25 ng/mL Cannabinoid, urine                 Cutoff 50 ng/mL Barbiturates + metabolites, urine  Cutoff 200 ng/mL Benzodiazepine, urine              Cutoff 200 ng/mL Methadone, urine                   Cutoff 300 ng/mL The urine drug screen provides only a preliminary, unconfirmed analytical test result and should not be used for non-medical purposes. Clinical consideration and professional judgment should be applied to any positive drug screen result due to possible interfering substances. A more specific alternate chemical method must be used in order to obtain a confirmed analytical result. Gas chromatography / mass spectrometry (GC/MS) is the preferred confirmat ory method. Performed at [facility], [address] [place],  [address]   Urinalysis, Complete w Microscopic     Status: Abnormal   Collection Time: 01/11/18  7:16 PM  Result Value Ref Range   Color, Urine COLORLESS (A) YELLOW   APPearance CLEAR (A) CLEAR   Specific Gravity, Urine 1.002 (L) 1.005 - 1.030   pH 6.0 5.0 - 8.0   Glucose, UA NEGATIVE NEGATIVE mg/dL   Hgb urine  dipstick MODERATE (A) NEGATIVE   Bilirubin Urine NEGATIVE NEGATIVE   Ketones, ur NEGATIVE NEGATIVE mg/dL   Protein, ur NEGATIVE NEGATIVE mg/dL   Nitrite NEGATIVE NEGATIVE   Leukocytes, UA NEGATIVE NEGATIVE   WBC, UA 0-5 0 - 5 WBC/hpf   Bacteria, UA NONE SEEN NONE SEEN   Squamous Epithelial / LPF NONE SEEN 0 - 5    Comment: Please note change in reference range. Performed at [facility], [address] [place], [place] [address]     No current  facility-administered medications for this encounter.    Current Outpatient Medications  Medication Sig Dispense Refill  . albuterol (PROVENTIL HFA;VENTOLIN HFA) 108 (90 Base) MCG/ACT inhaler Inhale 2 puffs into the lungs every 6 (six) hours as needed for wheezing or shortness of breath.    . cetirizine (ZYRTEC) 10 MG tablet Take 10 mg by mouth daily.    . citalopram (CELEXA) 20 MG tablet Take 20 mg by mouth daily.    . cyclobenzaprine (FLEXERIL) 5 MG tablet Take 1-2 tablets 3 times daily as needed (Patient taking differently: Take 5 mg by mouth 3 (three) times daily as needed for muscle spasms. ) 20 tablet 0  . famotidine (PEPCID) 40 MG tablet Take 40 mg by mouth every evening.    . fluticasone (FLONASE) 50 MCG/ACT nasal spray Place 2 sprays into both nostrils daily.    . folic acid (FOLVITE) 1 MG tablet Take 1 mg by mouth daily.    Marland Kitchen gabapentin (NEURONTIN) 300 MG capsule Take 300 mg by mouth 3 (three) times daily.    . methotrexate 250 MG/10ML injection Inject 25 mg into the vein once a week.    . polyethylene glycol (MIRALAX / GLYCOLAX) packet Take 17 g by mouth daily. Mix one tablespoon with 8oz of your favorite juice or water every day until you are having soft formed stools. Then start taking once daily if you didn't have a stool the day before. 30 each 0  . tiotropium (SPIRIVA) 18 MCG inhalation capsule Place 18 mcg into inhaler and inhale daily.    Marland Kitchen tiZANidine (ZANAFLEX) 4 MG tablet Take 4 mg by mouth 3 (three) times daily as needed for muscle spasms.    . Vitamin D, Ergocalciferol, (DRISDOL) 50000 units CAPS capsule Take 50,000 Units by mouth every 7 (seven) days.    . ALPRAZolam (XANAX) 1 MG tablet Take 1 tablet (1 mg total) by mouth 3 (three) times daily. (Patient not taking: Reported on 01/12/2018) 12 tablet 0  . HYDROcodone-acetaminophen (NORCO) 5-325 MG tablet Take 2 tablets by mouth every 6 (six) hours as needed for moderate pain. (Patient not taking: Reported on 01/12/2018) 6 tablet  0  . lidocaine (LIDODERM) 5 % Place 1 patch onto the skin every 12 (twelve) hours. Remove & Discard patch within 12 hours or as directed by MD (Patient not taking: Reported on 01/12/2018) 10 patch 0  . predniSONE (DELTASONE) 10 MG tablet Take 6 tablets on day 1, take 5 tablets on day 2, take 4 tablets on day 3, take 3 tablets on day 4, take 2 tablets on day 5, take 1 tablet on day 6 (Patient not taking: Reported on 01/12/2018) 21 tablet 0    Musculoskeletal: Strength & Muscle Tone: within normal limits Gait & Station: normal Patient leans: N/A  Psychiatric Specialty Exam: Physical Exam  Nursing note and vitals reviewed. Constitutional: She appears well-developed and well-nourished.  HENT:  Head: Normocephalic and atraumatic.  Eyes: Pupils are equal, round, and  reactive to light. Conjunctivae are normal.  Neck: Normal range of motion.  Cardiovascular: Regular rhythm and normal heart sounds.  Respiratory: Effort normal. No respiratory distress.  GI: Soft.  Musculoskeletal: Normal range of motion.  Neurological: She is alert.  Skin: Skin is warm and dry.  Psychiatric: Judgment normal. Her affect is blunt. Her speech is delayed. She is slowed. Thought content is not paranoid. Cognition and memory are impaired. She expresses no homicidal and no suicidal ideation.    Review of Systems  Constitutional: Negative.   HENT: Negative.   Eyes: Negative.   Respiratory: Negative.   Cardiovascular: Negative.   Gastrointestinal: Negative.   Musculoskeletal: Negative.   Skin: Negative.   Neurological: Negative.   Psychiatric/Behavioral: Positive for depression. Negative for hallucinations, memory loss, substance abuse and suicidal ideas. The patient is nervous/anxious and has insomnia.     Blood pressure 120/85, pulse 71, temperature (!) 97.5 F (36.4 C), temperature source Oral, resp. rate 16, height 5' 4"  (1.626 m), weight 63.5 kg (140 lb), SpO2 96 %.Body mass index is 24.03 kg/m.  General  Appearance: Casual  Eye Contact:  Fair  Speech:  Slow  Volume:  Decreased  Mood:  Dysphoric  Affect:  Constricted  Thought Process:  Goal Directed  Orientation:  Full (Time, Place, and Person)  Thought Content:  Logical  Suicidal Thoughts:  No  Homicidal Thoughts:  No  Memory:  Immediate;   Fair Recent;   Fair Remote;   Fair  Judgement:  Fair  Insight:  Good  Psychomotor Activity:  Decreased  Concentration:  Concentration: Fair  Recall:  [employer] of Knowledge:  Fair  Language:  Fair  Akathisia:  No  Handed:  Right  AIMS (if indicated):     Assets:  Desire for Improvement Housing Physical Health Resilience  ADL's:  Intact  Cognition:  WNL  Sleep:        Treatment Plan Summary: Plan 57 year old woman with a history of depression who came to the hospital upset last night crying tearful talking about having some suicidal ideation but has denied having any intent or plan.  On interview with me today the patient is calm and fairly forthcoming.  Denies any suicidal wish thought or plan and has able to articulate reasons not to harm herself.  Patient has been noncompliant with treatment in the past but is strongly encouraged to get back into treatment with [facility].  I offered to restart her on Prozac but the patient declined saying that she will take the Effexor that she already has at home.  Patient can be discharged from the emergency room case to be reviewed with emergency room physician and TTS.  Disposition: No evidence of imminent risk to self or others at present.   Patient does not meet criteria for psychiatric inpatient admission. Supportive therapy provided about ongoing stressors.  Alethia Berthold, MD 01/12/2018 3:35 PM

## 2018-01-12 NOTE — ED Notes (Signed)
Patient asking to leave so that she can go to work. Patient informed that Dr. Dahlia Client will take out IVC papers to keep her here till she talks to the psychiatrist if she insists. Informed patient we will make patients phone available to obtain phone number to call work.

## 2018-01-12 NOTE — ED Notes (Signed)
Pt. Transferred to [facility] from ED to room 4 after screening for contraband. Report to include Situation, Background, Assessment and Recommendations from [place]. Pt. Oriented to unit including Q15 minute rounds as well as the security cameras for their protection. Patient is alert and oriented, warm and dry in no acute distress. Patient denies SI, HI, and AVH. Pt. Encouraged to let me know if needs arise.

## 2018-01-12 NOTE — Discharge Instructions (Addendum)
You have been seen in the emergency department for a  psychiatric concern. You have been evaluated both medically as well as psychiatrically. Please follow-up with your outpatient resources provided. Return to the emergency department for any worsening symptoms, or any thoughts of hurting yourself or anyone else so that we may attempt to help you.  Please follow-up with your primary care doctor regarding urinary complaints.  Urine culture has been sent but there does not appear to be any current sign of infection.

## 2018-01-13 LAB — URINE CULTURE: Culture: NO GROWTH

## 2018-01-13 MED FILL — VITAMIN D2/50000UNT/CAPS: VITAMIN D2/50000UNT/CAPS | 84 days supply | Qty: 12 | Fill #0

## 2018-01-25 ENCOUNTER — Emergency Department: Admit: 2018-01-25 | Discharge: 2018-01-25 | Disposition: A | Discharge status: Home / Self Care | Payer: MEDICARE

## 2018-01-25 ENCOUNTER — Encounter: Admit: 2018-01-25 | Discharge: 2018-01-25 | Disposition: A | Discharge status: Home / Self Care | Payer: MEDICARE

## 2018-01-25 DIAGNOSIS — R102 Pelvic and perineal pain: Principal | ICD-10-CM

## 2018-01-25 LAB — URINALYSIS WITH CULTURE REFLEX
BACTERIA: NONE SEEN /HPF
GLUCOSE UA: NEGATIVE
LEUKOCYTE ESTERASE UA: NEGATIVE
NITRITE UA: NEGATIVE
PH UA: 5.5 (ref 5.0–9.0)
PROTEIN UA: NEGATIVE
RBC UA: 4 /HPF — ABNORMAL HIGH (ref <4)
SPECIFIC GRAVITY UA: 1.025 (ref 1.005–1.040)
UROBILINOGEN UA: 0.2
WBC UA: 2 /HPF (ref 0–5)

## 2018-01-25 LAB — CBC W/ AUTO DIFF
BASOPHILS ABSOLUTE COUNT: 0.1 10*9/L (ref 0.0–0.1)
BASOPHILS RELATIVE PERCENT: 0.7 %
EOSINOPHILS ABSOLUTE COUNT: 0.2 10*9/L (ref 0.0–0.4)
EOSINOPHILS RELATIVE PERCENT: 2.6 %
HEMATOCRIT: 45.1 % (ref 36.0–46.0)
HEMOGLOBIN: 14.8 g/dL (ref 13.5–16.0)
LARGE UNSTAINED CELLS: 2 % (ref 0–4)
LYMPHOCYTES ABSOLUTE COUNT: 2.3 10*9/L (ref 1.5–5.0)
LYMPHOCYTES RELATIVE PERCENT: 31.8 %
MEAN CORPUSCULAR HEMOGLOBIN CONC: 32.8 g/dL (ref 31.0–37.0)
MEAN CORPUSCULAR HEMOGLOBIN: 33.5 pg (ref 26.0–34.0)
MEAN PLATELET VOLUME: 7.9 fL (ref 7.0–10.0)
MONOCYTES ABSOLUTE COUNT: 0.4 10*9/L (ref 0.2–0.8)
MONOCYTES RELATIVE PERCENT: 5.7 %
PLATELET COUNT: 310 10*9/L (ref 150–440)
RED BLOOD CELL COUNT: 4.43 10*12/L (ref 4.00–5.20)
RED CELL DISTRIBUTION WIDTH: 16.7 % — ABNORMAL HIGH (ref 12.0–15.0)
WBC ADJUSTED: 7.2 10*9/L (ref 4.5–11.0)

## 2018-01-25 LAB — COMPREHENSIVE METABOLIC PANEL
ALBUMIN: 4.4 g/dL (ref 3.5–5.0)
ALKALINE PHOSPHATASE: 92 U/L (ref 38–126)
ALT (SGPT): 115 U/L — ABNORMAL HIGH (ref 15–48)
ANION GAP: 9 mmol/L (ref 9–15)
AST (SGOT): 66 U/L — ABNORMAL HIGH (ref 14–38)
BILIRUBIN TOTAL: 0.6 mg/dL (ref 0.0–1.2)
BUN / CREAT RATIO: 23
CALCIUM: 9.8 mg/dL (ref 8.5–10.2)
CO2: 22 mmol/L (ref 22.0–30.0)
CREATININE: 0.66 mg/dL (ref 0.60–1.00)
EGFR MDRD AF AMER: >=60 mL/min/{1.73_m2} (ref >=60)
EGFR MDRD NON AF AMER: >=60 mL/min/{1.73_m2} (ref >=60)
GLUCOSE RANDOM: 88 mg/dL (ref 65–179)
POTASSIUM: 4.7 mmol/L (ref 3.5–5.0)
PROTEIN TOTAL: 7.4 g/dL (ref 6.5–8.3)
SODIUM: 140 mmol/L (ref 135–145)

## 2018-01-25 LAB — MEAN CORPUSCULAR HEMOGLOBIN: Lab: 33.5

## 2018-01-25 LAB — SPECIFIC GRAVITY UA: Lab: 1.025

## 2018-01-25 LAB — CO2: Carbon dioxide:SCnc:Pt:Ser/Plas:Qn:: 22

## 2018-01-25 NOTE — Unmapped (Signed)
[facility] Emergency Department Provider Note    ED Clinical Impression     Final diagnoses:   LLQ pain (Primary)     Initial Impression, ED Course, Assessment and Plan     Michele Miller is a 57 y.o. female with PMH of COPD, CTS, depression, HA who presented to the ED with concerns for LLQ pain for a couple months. VSS. PE demonstrates: Abdomen soft with minimal LLQ TTP. BS normal. There is no CVA tenderness. Pt declined pelvic exam. Pt was given fluids and CBC, CMP, lipase, UA all WNL without acute process. CT AP with contrast without diverticulitis, ovarian cysts or masses, kidney stones or any evidence of acute pathology. Pt was offered reassurance and encouraged increased fiber and water and follow up with PCP. Given return precautions.     BP 145/65  - Pulse 78  - Temp 37.1 ??C (98.7 ??F) (Oral)  - Resp 16  - SpO2 98%   ____________________________________________    Time seen: Jan 25, 2018 1:10 PM    I have reviewed the triage vital signs and the nursing notes.      History     Chief Complaint  Pelvic Pain    HPI   Michele Miller is a 57 y.o. female with PMH of COPD, CTS, depression, HA who presented to the ED with concerns for LLQ pain for a couple months. She reports it is dull and then about 4 days ago, she got nauseated, diaphoretic at work and the pain in the LLQ got sharp and severe. She has had trouble eating since Thursday (4 days ago) and the pain radiates around to the left flank. Pt reports chills but no fevers. Pt has a history of hematuria and kidney stones. No N/V/D, no blood in stool. No vaginal bleeding or discharge. Pt does have chronic issues with constipation.    Past Medical History:   Diagnosis Date   ??? Arthritis    ??? Baker's cyst    ??? COPD (chronic obstructive pulmonary disease) (CMS-HCC)    ??? CTS (carpal tunnel syndrome)    ??? Depression    ??? Headache(784.0)    ??? Joint pain    ??? Psoriasis    ??? PTSD (post-traumatic stress disorder)    ??? Scoliosis      Past Surgical History: Procedure Laterality Date   ??? CARPAL TUNNEL RELEASE     ??? COLONOSCOPY     ??? HYSTERECTOMY      For endometriosis, ovaries still in place   ??? KNEE SURGERY     ??? PR REVISE MEDIAN N/CARPAL TUNNEL SURG Right 02/13/2014    Procedure: NEUROPLASTY AND/OR TRANSPOSITION; MEDIAN NERVE AT CARPAL TUNNEL;  Surgeon: Abram Sander, MD;  Location: [facility];  Service: Orthopedics     No current facility-administered medications for this encounter.     Current Outpatient Medications:   ???  albuterol (PROVENTIL HFA;VENTOLIN HFA) 90 mcg/actuation inhaler, Inhale 2 puffs every six (6) hours as needed for wheezing., Disp: 3 Inhaler, Rfl: 6  ???  cetirizine (ZYRTEC) 10 MG tablet, Take 10 mg by mouth daily. Frequency:QD   Dosage:10   MG  Instructions:  Note:Dose: 10MG , Disp: , Rfl:   ???  citalopram (CELEXA) 20 MG tablet, Take 1 tablet (20 mg total) by mouth daily., Disp: 90 tablet, Rfl: 3  ???  clobetasol (TEMOVATE) 0.05 % ointment, APPLY TOPICALLY TWICE DAILY, Disp: 30 g, Rfl: PRN  ???  diclofenac sodium (VOLTAREN) 1 % gel, Apply 2  g topically Four (4) times a day., Disp: 100 g, Rfl: 0  ???  ergocalciferol (DRISDOL) 50,000 unit capsule, TAKE 1 CAPSULE BY MOUTH ONCE EACH WEEK, Disp: 12 capsule, Rfl: 0  ???  famotidine (PEPCID) 40 MG tablet, Take 1 tablet (40 mg total) by mouth every evening., Disp: 360 tablet, Rfl: 0  ???  fluticasone (FLONASE) 50 mcg/actuation nasal spray, USE 2 SPRAYS IN EACH NOSTRIL DAILY, Disp: 48 g, Rfl: 3  ???  folic acid (FOLVITE) 1 MG tablet, Take 1 tablet (1,000 mcg total) by mouth daily., Disp: 90 tablet, Rfl: 3  ???  gabapentin (NEURONTIN) 300 MG capsule, Take 1 capsule (300 mg total) by mouth Three (3) times a day., Disp: 270 capsule, Rfl: 3  ???  lactulose (CHRONULAC) 20 gram/30 mL Soln, Take 20 grams (30 mL) daily as needed for constipation. Can increase to 2-3 times a day if once a day is ineffective., Disp: 1800 mL, Rfl: 0  ???  methotrexate 25 mg/mL injection solution, Inject 1 mL (25 mg total) under the skin every seven (7) days., Disp: 12 mL, Rfl: 1  ???  polyethylene glycol (MIRALAX) 17 gram packet, Take 17 g by mouth daily., Disp: 10 packet, Rfl: 0  ???  SPIRIVA WITH HANDIHALER 18 mcg inhalation capsule, PLACE 1 CAPSULE IN HANDIHALER AND INHALE ONCE DAILY, Disp: 2 each, Rfl: 11  ???  syringe with needle (TUBERCULIN SYRINGE) 1 mL 25 gauge x 5/8 Syrg, 1 Units by Miscellaneous route every seven (7) days. To be used with methotrexate, Disp: 12 Syringe, Rfl: 6  ???  tiZANidine (ZANAFLEX) 4 MG tablet, Take 1 tablet (4 mg total) by mouth Three (3) times a day as needed., Disp: 270 tablet, Rfl: 1    Allergies  Patient has no known allergies.    Family History   Problem Relation Age of Onset   ??? COPD Mother    ??? Osteoporosis Mother    ??? Hypertension Father    ??? No Known Problems Sister    ??? No Known Problems Daughter    ??? No Known Problems Maternal Grandmother    ??? No Known Problems Maternal Grandfather    ??? No Known Problems Paternal Grandmother    ??? No Known Problems Paternal Grandfather    ??? Anesthesia problems Neg Hx    ??? Broken bones Neg Hx    ??? Cancer Neg Hx    ??? Clotting disorder Neg Hx    ??? Collagen disease Neg Hx    ??? Diabetes Neg Hx    ??? Dislocations Neg Hx    ??? Fibromyalgia Neg Hx    ??? Gout Neg Hx    ??? Hemophilia Neg Hx    ??? Rheumatologic disease Neg Hx    ??? Scoliosis Neg Hx    ??? Severe sprains Neg Hx    ??? Sickle cell anemia Neg Hx    ??? Spinal Compression Fracture Neg Hx    ??? BRCA 1/2 Neg Hx    ??? Breast cancer Neg Hx    ??? Colon cancer Neg Hx    ??? Endometrial cancer Neg Hx    ??? Ovarian cancer Neg Hx      Social History  Social History     Tobacco Use   ??? Smoking status: Current Every Day Smoker     Packs/day: 1.00     Years: 20.00     Pack years: 20.00     Types: Cigarettes     Start date: 01/02/1974   ???  Smokeless tobacco: Never Used   Substance Use Topics   ??? Alcohol use: No     Alcohol/week: 0.0 oz   ??? Drug use: No     Review of Systems  Constitutional: Negative for fever.  Eyes: Negative for visual changes.  ENT: Negative for sore throat.  Cardiovascular: Negative for chest pain.  Respiratory: Negative for shortness of breath.  Gastrointestinal: Positive for abdominal pain. Negative for vomiting or diarrhea.  Genitourinary: Negative for dysuria.  Musculoskeletal: Negative for back pain.  Skin: Negative for rash.  Neurological: Negative for headaches, weakness or numbness.    Physical Exam     VITAL SIGNS:    ED Triage Vitals   Enc Vitals Group      BP 01/25/18 1151 107/83      Heart Rate 01/25/18 1151 102      SpO2 Pulse --       Resp 01/25/18 1151 16      Temp 01/25/18 1154 36.9 ??C (98.5 ??F)      Temp Source 01/25/18 1154 Oral      SpO2 01/25/18 1151 95 %     Constitutional: Alert and oriented. Well appearing and in no distress.  Eyes: Conjunctivae are normal.  ENT       Head: Normocephalic and atraumatic.       Nose: No congestion.       Mouth/Throat: Mucous membranes are moist.       Neck: No stridor.  Cardiovascular: Normal rate, regular rhythm. Normal and symmetric distal pulses are present in all extremities.  Respiratory: Normal respiratory effort. Breath sounds are normal.  Gastrointestinal: Soft with minimal LLQ TTP. BS normal. There is no CVA tenderness. Pt declined pelvic exam.   Musculoskeletal: Nontender with normal range of motion in all extremities.  Neurologic: Normal speech and language. No gross focal neurologic deficits are appreciated.  Skin: Skin is warm, dry and intact. No rash noted.    Radiology     Ct Abdomen Pelvis With Iv Contrast Only    Result Date: 01/25/2018  EXAM: CT abdomen and pelvis with contrast DATE: 01/25/2018 2:46 PM ACCESSION: [id number] UN DICTATED: 01/25/2018 2:52 PM INTERPRETATION LOCATION: [facility] CLINICAL INDICATION: 57 year old female with left lower quadrant abdominal pain. COMPARISON: 12/04/2016 CT urogram TECHNIQUE: A spiral CT scan was obtained with IV contrast from the lung bases to the pubic symphysis.  Images were reconstructed in the axial plane. Coronal and sagittal reformatted images were also provided for further evaluation. FINDINGS: LOWER CHEST: Unremarkable. ABDOMEN/PELVIS HEPATOBILIARY: Unremarkable liver. No biliary ductal dilatation. Gallbladder is unremarkable. PANCREAS: Unremarkable. SPLEEN: Unremarkable. ADRENAL GLANDS: Unremarkable. KIDNEYS/URETERS: Unchanged 1.0 cm left renal cyst. Additional subcentimeter hypodensities are too small to characterize. Symmetric nephrograms. No hydronephrosis.  BLADDER: Unremarkable. BOWEL/PERITONEUM/RETROPERITONEUM: No bowel obstruction. No acute inflammatory process. No ascites. VASCULATURE: Abdominal aorta within normal limits for patient's age. Unremarkable inferior vena cava. LYMPH NODES: No adenopathy. REPRODUCTIVE ORGANS: The uterus is surgically absent. No adnexal mass. BONES/SOFT TISSUES: Unchanged lucent lesion in the right posterior iliac wing. Mild multilevel degenerative disease of the spine.     - No acute abnormality in the abdomen or pelvis.    Pertinent labs & imaging results that were available during my care of the patient were reviewed by me and considered in my medical decision making (see chart for details).     Vaughan Sine, [place]  01/25/18 [phone]

## 2018-01-25 NOTE — Unmapped (Signed)
Pt ambulated to treatment room with steady gait.  Call bell within pt reach.  Pt changed into a gown

## 2018-01-25 NOTE — Unmapped (Signed)
Patient returned from CT Scan  Transported by Radiology  How tranported Wheelchair  Cardiac Monitor no

## 2018-01-25 NOTE — Unmapped (Signed)
Patient transported to CT Scan  Transported by Radiology  How tranported Wheelchair  Cardiac Monitor no

## 2018-01-25 NOTE — Unmapped (Signed)
Pt ambulatory to triage c/o LLQ abdominal/pelvic pain for several months. Pt reports the pain worsened today and now it radiates around to her lower back. Pt reports decreased appetite. Pt reports no pain when she urinates, but does report foul odor.

## 2018-01-28 MED FILL — GABAPENTIN/300MG/CAPS: GABAPENTIN/300MG/CAPS | 30 days supply | Qty: 90 | Fill #7

## 2018-02-19 MED ORDER — FLUTICASONE PROPIONATE 50 MCG/ACTUATION NASAL SPRAY,SUSPENSION
0 refills | 0.00000 days | Status: CP
Start: 2018-02-19 — End: 2018-02-19

## 2018-02-19 MED ORDER — FLUTICASONE PROPIONATE 50 MCG/ACTUATION NASAL SPRAY,SUSPENSION: 2 | g | 0 refills | 0 days | Status: AC

## 2018-02-22 MED FILL — GABAPENTIN/300MG/CAPS: GABAPENTIN/300MG/CAPS | 30 days supply | Qty: 90 | Fill #8

## 2018-02-22 MED FILL — FLUTICASONE/50MCG/ACT/SUSP: FLUTICASONE/50MCG/ACT/SUSP | 30 days supply | Qty: 1 | Fill #0

## 2018-03-03 NOTE — Unmapped (Addendum)
[facility]: Rheumatology Clinic Call  ??  Specialty Medication(s): SUBQ methotrexate  Indication(s): PsA    ??  [name] has been receiving subq methotrexate through [facility] but not formally enrolled and onboarded through [facility].  Many attempts were made the past months to onboard patient but unsuccessful and refills were completed despite not being onboarded.  [facility] notified to not send additional methotrexate unless patient speak directly with myself or pharmacist at the [facility] pharmacy.      This is the 7th attempts to reach patient for onboard/clinical assessment.  Left voicemail when able.  Other times, unable to accept incoming calls.    Will disenroll patient from specialty call list.  When patient calls the [facility] for refills - will chat with patient and she can opt out of specialty calls if interested.      Chelsea Aus

## 2018-03-11 ENCOUNTER — Encounter: Admit: 2018-03-11 | Discharge: 2018-03-11 | Disposition: A | Discharge status: Home / Self Care | Payer: MEDICARE

## 2018-03-11 NOTE — Unmapped (Signed)
Patient presents to the ED with allergic rx that started today after trying new foundation.  NAD, no swelling. She took an anti histamine this morning.

## 2018-03-11 NOTE — Unmapped (Signed)
[facility] Emergency Department Provider Note      ED Clinical Impression     Final diagnoses:   Skin irritation due to topical agent (Primary)       Initial Impression, Assessment and Plan, and ED Course     57 y.o. female with a PMH of CTS, depression, COPD, PTSD, and psoriatic arthritis who presents to the ED for evaluation of facial redness and burning since 2 days ago in the setting of application of new makeup. No f/c, no systemic signs/sx.     VSS on arrival, pt afebrile. Pt well appearing on exam, in NAD, non-toxic, resting comfortably. HENT exam is benign, airway patent, no PO swelling, uvula midline without swelling. BL TM's clear, no signs of infection. There is very mild erythema noticed to the bridge of the nose, forehead, and chin. No appreciable swelling. Negative Nikolsky's sign. There are no intraoral lesions. Exam is otherwise unremarkable.     Suspect possible allergic contact dermatitis 2/2 new makeup exposure. Low concern for any dangerous acute etiology - pt without systemic signs or symptoms. Will provide topical hydrocortisone. I have urged pt to avoid use for longer than 2 days at a time given topical steroids can cause skin thinning. Pt urged to avoid any known triggers. Pt is stable for d/c. PCP f/u advised. Strict return precautions have been discussed. Pt is agreeable with this plan and expresses understanding.       History     Chief Complaint  Allergic Reaction      HPI   Patient was seen by me at 4:52 PM.    Patient is a 57 y.o. female with a PMH of arthritis, CTS, depression, COPD, PTSD, and psoriatic arthritis who presents to the ED for evaluation of facial redness and burning since 2 days ago. Pt states she applied a new makeup foundation to her face yesterday. The next morning she noticed a burning sensation to her chin, forehead, and cheeks with some overlying redness and swelling. Denies any itching. She states the pain becomes worse with sun exposure. She denies any other new contacts or new medications, however states she has been living with a friend recently who has two large cats. She denies throat swelling, difficulty breathing, or difficulty swallowing. She denies f/c. She also noticed an onset of BL ear pain 2 days ago. Denies any other symptoms currently.     Previous chart, nursing notes, and vital signs reviewed.      Pertinent labs & imaging results that were available during my care of the patient were reviewed by me and considered in my medical decision making (see chart for details).    Past Medical History:   Diagnosis Date   ??? Arthritis    ??? Baker's cyst    ??? COPD (chronic obstructive pulmonary disease) (CMS-HCC)    ??? CTS (carpal tunnel syndrome)    ??? Depression    ??? Headache(784.0)    ??? Joint pain    ??? Psoriasis    ??? PTSD (post-traumatic stress disorder)    ??? Scoliosis        Past Surgical History:   Procedure Laterality Date   ??? CARPAL TUNNEL RELEASE     ??? COLONOSCOPY     ??? HYSTERECTOMY      For endometriosis, ovaries still in place   ??? KNEE SURGERY     ??? PR REVISE MEDIAN N/CARPAL TUNNEL SURG Right 02/13/2014    Procedure: NEUROPLASTY AND/OR TRANSPOSITION; MEDIAN NERVE AT CARPAL  TUNNEL;  Surgeon: Abram Sander, MD;  Location: [facility] OR [facility];  Service: Orthopedics       No current facility-administered medications for this encounter.     Current Outpatient Medications:   ???  albuterol (PROVENTIL HFA;VENTOLIN HFA) 90 mcg/actuation inhaler, Inhale 2 puffs every six (6) hours as needed for wheezing., Disp: 3 Inhaler, Rfl: 6  ???  cetirizine (ZYRTEC) 10 MG tablet, Take 10 mg by mouth daily. Frequency:QD   Dosage:10   MG  Instructions:  Note:Dose: 10MG , Disp: , Rfl:   ???  citalopram (CELEXA) 20 MG tablet, Take 1 tablet (20 mg total) by mouth daily., Disp: 90 tablet, Rfl: 3  ???  clobetasol (TEMOVATE) 0.05 % ointment, APPLY TOPICALLY TWICE DAILY, Disp: 30 g, Rfl: PRN  ???  ergocalciferol (DRISDOL) 50,000 unit capsule, TAKE 1 CAPSULE BY MOUTH ONCE EACH WEEK, Disp: 12 capsule, Rfl: 0  ???  famotidine (PEPCID) 40 MG tablet, Take 1 tablet (40 mg total) by mouth every evening., Disp: 360 tablet, Rfl: 0  ???  fluticasone propionate (FLONASE) 50 mcg/actuation nasal spray, USE 2 SPRAYS IN EACH NOSTRIL DAILY, Disp: 16 g, Rfl: 0  ???  folic acid (FOLVITE) 1 MG tablet, Take 1 tablet (1,000 mcg total) by mouth daily., Disp: 90 tablet, Rfl: 3  ???  gabapentin (NEURONTIN) 300 MG capsule, Take 1 capsule (300 mg total) by mouth Three (3) times a day., Disp: 270 capsule, Rfl: 3  ???  lactulose (CHRONULAC) 20 gram/30 mL Soln, Take 20 grams (30 mL) daily as needed for constipation. Can increase to 2-3 times a day if once a day is ineffective., Disp: 1800 mL, Rfl: 0  ???  methotrexate 25 mg/mL injection solution, Inject 1 mL (25 mg total) under the skin every seven (7) days., Disp: 12 mL, Rfl: 1  ???  polyethylene glycol (MIRALAX) 17 gram packet, Take 17 g by mouth daily., Disp: 10 packet, Rfl: 0  ???  SPIRIVA WITH HANDIHALER 18 mcg inhalation capsule, PLACE 1 CAPSULE IN HANDIHALER AND INHALE ONCE DAILY, Disp: 2 each, Rfl: 11  ???  syringe with needle (TUBERCULIN SYRINGE) 1 mL 25 gauge x 5/8 Syrg, 1 Units by Miscellaneous route every seven (7) days. To be used with methotrexate, Disp: 12 Syringe, Rfl: 6  ???  tiZANidine (ZANAFLEX) 4 MG tablet, Take 1 tablet (4 mg total) by mouth Three (3) times a day as needed., Disp: 270 tablet, Rfl: 1    Allergies  Patient has no known allergies.    Family History   Problem Relation Age of Onset   ??? COPD Mother    ??? Osteoporosis Mother    ??? Hypertension Father    ??? No Known Problems Sister    ??? No Known Problems Daughter    ??? No Known Problems Maternal Grandmother    ??? No Known Problems Maternal Grandfather    ??? No Known Problems Paternal Grandmother    ??? No Known Problems Paternal Grandfather    ??? Anesthesia problems Neg Hx    ??? Broken bones Neg Hx    ??? Cancer Neg Hx    ??? Clotting disorder Neg Hx    ??? Collagen disease Neg Hx    ??? Diabetes Neg Hx    ??? Dislocations Neg Hx    ??? Fibromyalgia Neg Hx    ??? Gout Neg Hx    ??? Hemophilia Neg Hx    ??? Rheumatologic disease Neg Hx    ??? Scoliosis Neg Hx    ??? Severe  sprains Neg Hx    ??? Sickle cell anemia Neg Hx    ??? Spinal Compression Fracture Neg Hx    ??? BRCA 1/2 Neg Hx    ??? Breast cancer Neg Hx    ??? Colon cancer Neg Hx    ??? Endometrial cancer Neg Hx    ??? Ovarian cancer Neg Hx        Social History  Social History     Tobacco Use   ??? Smoking status: Current Every Day Smoker     Packs/day: 1.50     Years: 20.00     Pack years: 30.00     Types: Cigarettes     Start date: 01/02/1974   ??? Smokeless tobacco: Never Used   Substance Use Topics   ??? Alcohol use: No     Alcohol/week: 0.0 oz   ??? Drug use: No       Review of Systems    Review of Systems   Constitutional: Negative for chills and fever.   HENT: Positive for ear pain (BL) and facial swelling (mild). Negative for trouble swallowing and voice change.    Respiratory: Negative for shortness of breath and stridor.    Skin: Positive for rash.   All other systems reviewed and are negative.        Labs     Labs Reviewed - No data to display    Radiology     No results found.    Physical Exam     VITAL SIGNS:    Vitals:    03/11/18 1253 03/11/18 1733   BP: 144/87 130/88   Pulse: 74 69   Resp: 18    Temp: 36.9 ??C (98.4 ??F)    TempSrc: Oral    SpO2: 99% 96%   Weight: 64.4 kg (142 lb)    Height: 162.6 cm (5' 4)        Physical Exam   Constitutional: She is oriented to person, place, and time. She appears well-developed and well-nourished. No distress.   Pt very well appearing on exam, in NAD, nontoxic, resting comfortably.    HENT:   Head: Normocephalic and atraumatic.   Right Ear: Tympanic membrane and external ear normal.   Left Ear: Tympanic membrane and external ear normal.   Nose: Nose normal.   Mouth/Throat: Oropharynx is clear and moist. No oropharyngeal exudate.   Airway patent, no PO swelling, uvula midline without swelling. BL TM's clear, no signs of infection.   Neck: Normal range of motion. Cardiovascular: Normal rate, regular rhythm and normal heart sounds. Exam reveals no gallop and no friction rub.   No murmur heard.  Pulmonary/Chest: Effort normal and breath sounds normal. No stridor. No respiratory distress. She has no wheezes. She has no rales.   Musculoskeletal: Normal range of motion.   Neurological: She is alert and oriented to person, place, and time.   Skin: Skin is warm and dry.   Psychiatric: She has a normal mood and affect. Her behavior is normal.   Nursing note and vitals reviewed.              Toni Arthurs [place], [place]  03/15/18 [phone]

## 2018-03-11 NOTE — Unmapped (Signed)
Pt reports trying a new make up foundation 2 days ago and started feeling nauseous, burning in her face, scratchy throat and diaphoresis.  Pt reports that Sx are getting worse.  Pt speaks in full sentences and handle secretions, in NAD.  No redness or swelling noted.

## 2018-04-05 MED FILL — GABAPENTIN/300MG/CAPS: GABAPENTIN/300MG/CAPS | 90 days supply | Qty: 270 | Fill #0

## 2018-04-05 MED FILL — TIZANIDINE HCL/4MG/TABS: TIZANIDINE HCL/4MG/TABS | 90 days supply | Qty: 270 | Fill #1

## 2018-05-13 MED ORDER — SYRINGE WITH NEEDLE 1 ML 27 X 1/2"
SUBCUTANEOUS | 0 refills | 0 days | Status: CP
Start: 2018-05-13 — End: 2019-01-21

## 2018-05-13 MED ORDER — FOLIC ACID 1 MG TABLET
ORAL_TABLET | Freq: Every day | ORAL | 3 refills | 0.00000 days | Status: CP
Start: 2018-05-13 — End: 2019-05-13
  Filled 2018-05-18: qty 90, 90d supply, fill #0

## 2018-05-13 MED ORDER — METHOTREXATE SODIUM 25 MG/ML INJECTION SOLUTION
SUBCUTANEOUS | 0 refills | 0.00000 days | Status: CP
Start: 2018-05-13 — End: 2018-08-16

## 2018-05-13 NOTE — Unmapped (Signed)
[facility] - Pharmacist Onboarding Note    Specialty Medication: subq methotrexate     Michele Miller, DOB: 1960/12/22  Above HIPAA information was verified with patient.     Michele Miller is a 57 y.o. female who has been receiving subq methotrexate through [facility] for psoriatic arthritis but never formally onboarded.  Multiple attempts to contact patient in the past to onboard were unsuccessful, therefore, patient was disenrolled from the [facility] specialty list with notes to not send refills unless patient speaks with a pharmacist.  Earlier this week, received message from the [facility] pharmacy that patient called in to request for refills of methotrexate.  She has family a family emergency requiring her to be in [place] for an extended amount of time, thus missed recent clinic appointment. Next appt with Dr. Delton See already scheduled in November and patient will try to make appointment.  Will onboard patient at this time and send refills until that clinic visit per conversation with Carlus Pavlov, PA.  She is tolerating methorexate well and does not have any issues with injections.  Patient reports missing 1-2 doses of subq methorexate recently due to family emergency and stress.  She states she is not doing well also in terms of psoriatic arthritis and symptoms are returning.  Unfortunately, patient will need to come to clinic to be re-evaluated before any changes in therapy can take place.  Subq methotrexate, folic acid and syringes with needles will be shipped to a friend's house and this friend will send meds over to patient.      Regimen & Administration: Methotrexate 25 mg (1 mL) subq once every 7 days.    Administer without regards to meal.  If a dose is missed, administer as soon as it is remembered and restart administration cycle    Storage/Handling/Disposal:  Room Temperature.  Patient will dispose of needles in a sharps container or empty laundry detergent bottle.     Drug-Drug & Drug-Food Interactions:  None noted    Side-effects:    ?? Discussed the risk of GI side effects, fatigue, hair loss, oral ulcers, rash, abnormalities in blood counts, liver, kidney & lung toxicity, infection, and teratogenicity.    ?? In the case of signs of infections including fever, discomfort when urinating, sinus congestion, or other complaints, patient should hold the next dose of methotrexate and call clinic to ensure adequate medical care  ?? Emphasized the importance of daily folic acid    Injection Training:   ?? Patient declined counseling as she is familiar and tolerating injections at this time.      Pharmacy Information:    ?? Patient will be receiving medication from the [facility] [phone], option 4).  A representative from the pharmacy will contact the patient to set up deliveries 7-10 days prior to their subsequent needed refill.  The pharmacy must speak to the patient to schedule the refill.  Advised patient to answer phone calls from the pharmacy to prevent delays in therapy.    ?? Patient will receive a medication information handout as well as a welcome packet from the pharmacy.    ?? The pharmacy is open Monday - Friday 8:30am-4:30pm.  A pharmacist is available 24/7 via pager to answer any clinical questions.    ?? Patient will receive medication through common mail carrier (UPS).  ?? Anticipated co-pay:  $0 for a 3 month supply.    ?? Medication assistance provided: PAP  Emphasized the importance of adherence to prescribed regimen, clinic follow-up visits, and laboratory testing.      SHIPPING     ?? Shipping address verified in [facility].  Expected medication delivery date: 05/19/18, via UPS.  Patient plans to start therapy as soon as medications is shipped to [place] by a friend.    ?? Other medications/items to be shipping:  Transport planner, folic acid     Patient specific needs were assessed and addressed:  language differences, literacy level, cultural barriers, cognitive and/or physical impairments.      All questions were answered and contact information provided for any future questions/concerns.      [name]

## 2018-05-18 MED FILL — METHOTREXATE SODIUM 25 MG/ML INJECTION SOLUTION: SUBCUTANEOUS | 84 days supply | Qty: 12 | Fill #0

## 2018-05-18 MED FILL — FOLIC ACID 1 MG TABLET: 90 days supply | Qty: 90 | Fill #0 | Status: AC

## 2018-05-18 MED FILL — BD TUBERCULIN SYRINGE 1 ML 27 X 1/2": 84 days supply | Qty: 12 | Fill #0 | Status: AC

## 2018-05-18 MED FILL — BD TUBERCULIN SYRINGE 1 ML 27 X 1/2": SUBCUTANEOUS | 84 days supply | Qty: 12 | Fill #0

## 2018-05-18 MED FILL — METHOTREXATE SODIUM 25 MG/ML INJECTION SOLUTION: 84 days supply | Qty: 12 | Fill #0 | Status: AC

## 2018-06-16 MED FILL — GABAPENTIN 300 MG CAPSULE: 90 days supply | Qty: 270 | Fill #0 | Status: AC

## 2018-06-16 MED FILL — GABAPENTIN 300 MG CAPSULE: ORAL | 90 days supply | Qty: 270 | Fill #0

## 2018-08-05 ENCOUNTER — Encounter: Admit: 2018-08-05 | Discharge: 2018-08-05 | Payer: MEDICARE | Attending: Rheumatology | Primary: Rheumatology

## 2018-08-05 ENCOUNTER — Ambulatory Visit: Admit: 2018-08-05 | Discharge: 2018-08-05 | Payer: MEDICARE

## 2018-08-05 ENCOUNTER — Encounter: Admit: 2018-08-05 | Discharge: 2018-08-05 | Payer: MEDICARE

## 2018-08-05 DIAGNOSIS — L4050 Arthropathic psoriasis, unspecified: Principal | ICD-10-CM

## 2018-08-05 DIAGNOSIS — M1990 Unspecified osteoarthritis, unspecified site: Secondary | ICD-10-CM

## 2018-08-05 DIAGNOSIS — Z79899 Other long term (current) drug therapy: Secondary | ICD-10-CM

## 2018-08-05 LAB — MONOCYTES RELATIVE PERCENT: Lab: 5.7

## 2018-08-05 LAB — CBC W/ AUTO DIFF
BASOPHILS ABSOLUTE COUNT: 0 10*9/L (ref 0.0–0.1)
BASOPHILS RELATIVE PERCENT: 0.7 %
EOSINOPHILS ABSOLUTE COUNT: 0.1 10*9/L (ref 0.0–0.4)
EOSINOPHILS RELATIVE PERCENT: 2 %
HEMATOCRIT: 44.4 % (ref 36.0–46.0)
HEMOGLOBIN: 14.1 g/dL (ref 12.0–16.0)
LARGE UNSTAINED CELLS: 3 % (ref 0–4)
LYMPHOCYTES RELATIVE PERCENT: 36.4 %
MEAN CORPUSCULAR HEMOGLOBIN CONC: 31.8 g/dL (ref 31.0–37.0)
MEAN CORPUSCULAR HEMOGLOBIN: 31.9 pg (ref 26.0–34.0)
MEAN CORPUSCULAR VOLUME: 100.5 fL — ABNORMAL HIGH (ref 80.0–100.0)
MEAN PLATELET VOLUME: 7.3 fL (ref 7.0–10.0)
MONOCYTES ABSOLUTE COUNT: 0.3 10*9/L (ref 0.2–0.8)
MONOCYTES RELATIVE PERCENT: 5.7 %
NEUTROPHILS ABSOLUTE COUNT: 2.9 10*9/L (ref 2.0–7.5)
NEUTROPHILS RELATIVE PERCENT: 52.2 %
RED BLOOD CELL COUNT: 4.42 10*12/L (ref 4.00–5.20)
RED CELL DISTRIBUTION WIDTH: 15.1 % — ABNORMAL HIGH (ref 12.0–15.0)
WBC ADJUSTED: 5.5 10*9/L (ref 4.5–11.0)

## 2018-08-05 LAB — COMPREHENSIVE METABOLIC PANEL
ALBUMIN: 4 g/dL (ref 3.5–5.0)
ALKALINE PHOSPHATASE: 106 U/L (ref 38–126)
ALT (SGPT): 17 U/L (ref <35)
ANION GAP: 7 mmol/L (ref 7–15)
AST (SGOT): 24 U/L (ref 17–47)
BILIRUBIN TOTAL: 0.4 mg/dL (ref 0.0–1.2)
BLOOD UREA NITROGEN: 9 mg/dL (ref 7–21)
BUN / CREAT RATIO: 12
CALCIUM: 9.6 mg/dL (ref 8.5–10.2)
CO2: 29 mmol/L (ref 22.0–30.0)
CREATININE: 0.78 mg/dL (ref 0.60–1.00)
EGFR CKD-EPI AA FEMALE: >90 mL/min/{1.73_m2} (ref >=60)
EGFR CKD-EPI NON-AA FEMALE: 85 mL/min/{1.73_m2} (ref >=60)
GLUCOSE RANDOM: 92 mg/dL (ref 65–179)
POTASSIUM: 3.8 mmol/L (ref 3.5–5.0)
PROTEIN TOTAL: 6.8 g/dL (ref 6.5–8.3)

## 2018-08-05 LAB — ERYTHROCYTE SEDIMENTATION RATE: Lab: 22

## 2018-08-05 LAB — CO2: Carbon dioxide:SCnc:Pt:Ser/Plas:Qn:: 29

## 2018-08-05 LAB — C-REACTIVE PROTEIN: C reactive protein:MCnc:Pt:Ser/Plas:Qn:: 7.4

## 2018-08-05 MED ORDER — TRAZODONE 50 MG TABLET
ORAL_TABLET | Freq: Every evening | ORAL | 1 refills | 0 days | Status: CP
Start: 2018-08-05 — End: 2018-10-18
  Filled 2018-08-06: qty 14, 14d supply, fill #0

## 2018-08-05 MED ORDER — DULOXETINE 60 MG CAPSULE,DELAYED RELEASE
ORAL_CAPSULE | Freq: Every day | ORAL | 1 refills | 0 days | Status: CP
Start: 2018-08-05 — End: 2018-10-18
  Filled 2018-08-06: qty 28, 14d supply, fill #0

## 2018-08-05 MED ORDER — DICLOFENAC 1 % TOPICAL GEL
Freq: Four times a day (QID) | TOPICAL | PRN refills | 0 days | Status: CP | PRN
Start: 2018-08-05 — End: ?
  Filled 2018-08-06: qty 300, 13d supply, fill #0

## 2018-08-05 MED ORDER — GABAPENTIN 300 MG CAPSULE
ORAL_CAPSULE | ORAL | 0 refills | 0 days | Status: CP
Start: 2018-08-05 — End: 2018-10-25
  Filled 2018-08-06: qty 42, 14d supply, fill #0

## 2018-08-05 NOTE — Unmapped (Signed)
REASON FOR VISIT: F/u PsA.    HISTORY: [name] is a 57 y.o. female with hx of psoriatic arthritis, initially seen by Dr. Luster Landsberg, then Dr. Broadus John and most recently by Carlus Pavlov, PA.  The patient also has had longstanding diffuse joint pain and also carries a diagnosis of centralized pain syndrome as well as degenerative disk disease. For her psoriatic arthritis, she has been on methotrexate for many years - initially PO and most recently 25mg  SQ weekly.  She also has clinical findings suggestive of centralized chronic pain. Was following pain management but has lost f/u due to transportation issues.  She has multiple psychiatric comorbidities including PTSD and anxiety. These have been exacerbated by her son's untimely death of her son in 60. She was recently living in [place] and getting care there but is now back in [place], working to re-establish with a mental health provider here.    Interim history:  She presents today for f/u of her psoriatic arthritis and FMS.  She again states that her overall pain is worsening (which is noted in her previous several notes as well). She states that her left foot and left toes have pain increasing dramatically over the last 6 months, not red/warm, feels like her toes are pulling apart. Mostly with severe back pain, over 2 months, no known reason for this worsening, no falls, trauma. No rash or nail changes.  No eye inflammation.  No IBD symptoms. No active skin disease. She recalls seeing spine center in the past but looks like this was more for her neck.      Current Outpatient Medications   Medication Sig Dispense Refill   ??? cetirizine (ZYRTEC) 10 MG tablet Take 10 mg by mouth daily. Frequency:QD   Dosage:10   MG  Instructions:  Note:Dose: 10MG      ??? clobetasol (TEMOVATE) 0.05 % ointment APPLY TOPICALLY TWICE DAILY 30 g PRN   ??? ergocalciferol (DRISDOL) 50,000 unit capsule TAKE 1 CAPSULE BY MOUTH ONCE EACH WEEK 12 capsule 0   ??? fluticasone propionate (FLONASE) 50 mcg/actuation nasal spray USE 2 SPRAYS IN EACH NOSTRIL DAILY 16 g 0   ??? folic acid (FOLVITE) 1 MG tablet Take 1 tablet (1 mg total) by mouth daily. 90 tablet 3   ??? gabapentin (NEURONTIN) 300 MG capsule TAKE 1 CAPSULE BY MOUTH 3 TIMES DAILY 270 capsule 0   ??? lactulose (CHRONULAC) 20 gram/30 mL Soln Take 20 grams (30 mL) daily as needed for constipation. Can increase to 2-3 times a day if once a day is ineffective. 1800 mL 0   ??? methotrexate 25 mg/mL injection solution Inject 1 mL (25 mg total) under the skin once a week. 12 mL 0   ??? needle, disp, 25 gauge 25 gauge x 5/8 Ndle USE AS DIRECTED 12 each 6   ??? polyethylene glycol (MIRALAX) 17 gram packet Take 17 g by mouth daily. 10 packet 0   ??? syringe with needle 1 mL 27 x 1/2 Syrg USE AS DIRECTED FOR WEEKLY METHOTREXATE INJECTIONS 50 each 0   ??? tiZANidine (ZANAFLEX) 4 MG tablet TAKE 1 TABLET (4 MG TOTAL) BY MOUTH THREE (3) TIMES A DAY AS NEEDED. 270 tablet 1   ??? traZODone (DESYREL) 50 MG tablet Take 1 tablet (50 mg total) by mouth nightly. 30 tablet 1   ??? albuterol (PROVENTIL HFA;VENTOLIN HFA) 90 mcg/actuation inhaler Inhale 2 puffs every six (6) hours as needed for wheezing. 3 Inhaler 6   ??? citalopram (CELEXA) 20 MG tablet Take  1 tablet (20 mg total) by mouth daily. 90 tablet 3   ??? diclofenac sodium (VOLTAREN) 1 % gel Apply 2g topically to small joint and 4g to large joints as needed up to 4 times daily 300 g PRN   ??? DULoxetine (CYMBALTA) 60 MG capsule Take 2 capsules (120 mg total) by mouth daily. 60 capsule 1   ??? famotidine (PEPCID) 40 MG tablet Take 1 tablet (40 mg total) by mouth every evening. 360 tablet 0     No current facility-administered medications for this visit.      Past Medical History:   Diagnosis Date   ??? Arthritis    ??? Baker's cyst    ??? COPD (chronic obstructive pulmonary disease) (CMS-HCC)    ??? CTS (carpal tunnel syndrome)    ??? Depression    ??? Headache(784.0)    ??? Joint pain    ??? Psoriasis    ??? PTSD (post-traumatic stress disorder)    ??? Scoliosis Past Surgical History:   Procedure Laterality Date   ??? CARPAL TUNNEL RELEASE     ??? COLONOSCOPY     ??? HYSTERECTOMY      For endometriosis, ovaries still in place   ??? KNEE SURGERY     ??? PR REVISE MEDIAN N/CARPAL TUNNEL SURG Right 02/13/2014    Procedure: NEUROPLASTY AND/OR TRANSPOSITION; MEDIAN NERVE AT CARPAL TUNNEL;  Surgeon: Abram Sander, MD;  Location: [facility];  Service: Orthopedics      Record Review: Available records were reviewed, including pertinent office visits, labs, and imaging.      REVIEW OF SYSTEMS: Ten system were reviewed and negative except as noted above.    PHYSICAL EXAM:  VITAL SIGNS:   Vitals:    08/05/18 1215   BP: 117/72   BP Site: R Arm   BP Position: Sitting   BP Cuff Size: Medium   Pulse: 88   Temp: 36.1 ??C (97 ??F)   TempSrc: Oral   Weight: 62.3 kg (137 lb 6.4 oz)   Height: 162.6 cm (5' 4.02)   Body mass index is 23.57 kg/m??.     General:   Pleasant 57 y.o.female in no acute distress, WDWN   Eyes:   PERRL, conjunctiva and sclera not inflamed. Tears appear adequate.    ENT:   No oropharyngeal lesions. Mucous membranes moist.    Lymph:   No masses or cervical lymphadenopathy.    Cardiovascular:  Regular rate and rhythm. No murmur, rub, or gallop. No lower extremity edema.    Lungs:  Clear to auscultation.Normal respiratory effort.    Musculoskeletal:   General: Ambulates w/o assistance,  +10/18 Tender points on exam   Hands: Globally tender without synovitis.  Grip intact.   Wrists: FROM w/o synovitis.   Elbows:FROM b/l w/o effusion, tenderness bilaterally, especially of the R>>L medial epicondyle  Shoulders: FROM w/ pain  Spine: Reduced cervical ROM with diffuse pain. Occiput to wall 0 CM. Increased trapezius tone b/l, tender paraspinal muscles diffusely. No pain in SI joints with FABER.  Schober 14cm, reduced extension.  Hips: FROM with pain, tender b/l trochateric bursa   Knees: FROM b/l w/o effusion, painful range of motion bilaterally, tenderness of R pes anserine bursa and at enthesal insertions around the patella  Ankles: No effusion.  Tenderness bilaterally, globally. No enthesitis.  Feet: No dactylitis. Pain with MTP squeeze b/l. Hard nodule on bottom of L foot under arch. 2nd and 3rd toes noted to be more separated, but no red/warm/swollen areas or  dactylitis. Minimally tender.    Neurological:  CN 2-12 grossly intact. 5/5 strength on extremities.   Psych:  Appropriate affect and mood   Skin:   no psoriatic rashes. No nail pitting.        ASSESSMENT/PLAN:  1. Psoriatic arthritis (CMS-HCC)  Without obvious evidence for active inflammatory arthritis at recent visits, but endorses more pain and possible inflammatory symptoms today. No skin psoriasis noted on exam. Will evaluate further as below before considering whether to escalate immunosuppressive therapy. Continue mtx 25 mg sq qwk, FA 1 mg qd. Reviewed recent hand and foot xrays and will review xrays ordered today, call patient next week to discuss tx plan.  Also, noted elevations of LFTs on recent labs x 2, will have to re-evaluate MTX therapy if this is a continuing trend.    *xrays as below  *refer to spine center  *obtain recent labs for hepatitis, quantiferon, from [place] in case of biologic initiation  *Call patient with results [phone] next week    Orders Placed This Encounter   Procedures   ??? XR Sacroiliac Joints   ??? XR Lumbar Spine AP Lateral And Obliques   ??? XR Cervical Spine AP Lateral Odontoid And Flexion And Extension   ??? Comprehensive Metabolic Panel   ??? CBC w/ Differential   ??? Sedimentation Rate   ??? C-reactive protein   ??? Ambulatory referral to Spine Center   ??? Obtain medical records     2. Fibromyalgia  Again, this seems to be the etiology for her continued pain. Discussed the importance of graded cardiovascular exercise, good restorative sleep, and management of comorbid psychiatric conditions. She will discuss insomnia with her PCP. She is working to establish with mental health provider locally.   - temporary refills as noted below    3. Methotrexate, long term, current use  Checking labs as noted to evaluate for medication toxicity, recent LFTs were mildly elevated    4. Medication requests pending f/u with mental health provider in [place]  - refilled Cymbalta 120mg  QD, trazodone 50mg  QHS, and gabapentin 300mg  TID x 2 months to allow her to establish as noted.    HCM:   - PCV13 Status: Did not discuss today  - PPSV 23 Status: 11/03/2013  - TdaP (Booster once every 10 years). Status:  - Annual Influenza vaccine. Status: 06/23/2016, declines to update  - Bone health: not on prednisone   - Contraception: Status post hysterectomy    Return in about 3 months (around 11/05/2018). with [place]ne [place], 6 mos with me.    25 minutes was spent with the patient, over half of which was counseling regarding dx and treatment plan.

## 2018-08-05 NOTE — Unmapped (Signed)
You can call the [facility] pharmacy to check on your mailed refills: [phone]

## 2018-08-06 MED FILL — DULOXETINE 60 MG CAPSULE,DELAYED RELEASE: 14 days supply | Qty: 28 | Fill #0 | Status: AC

## 2018-08-06 MED FILL — DICLOFENAC 1 % TOPICAL GEL: 13 days supply | Qty: 300 | Fill #0 | Status: AC

## 2018-08-06 MED FILL — BD TUBERCULIN SYRINGE 1 ML 27 X 1/2": 84 days supply | Qty: 12 | Fill #1 | Status: AC

## 2018-08-06 MED FILL — TRAZODONE 50 MG TABLET: 14 days supply | Qty: 14 | Fill #0 | Status: AC

## 2018-08-06 MED FILL — BD TUBERCULIN SYRINGE 1 ML 27 X 1/2": SUBCUTANEOUS | 84 days supply | Qty: 12 | Fill #1

## 2018-08-06 MED FILL — GABAPENTIN 300 MG CAPSULE: 14 days supply | Qty: 42 | Fill #0 | Status: AC

## 2018-08-11 NOTE — Unmapped (Signed)
Attempted to contact patient regarding the results. Unable to leave a message on the patients voicemail so I sent the patient a MyChart message with what the letter stated.    Tiff

## 2018-08-11 NOTE — Unmapped (Signed)
Pt called and letter discussed. Patient verbalized understanding.

## 2018-08-11 NOTE — Unmapped (Signed)
Pt. States she was seen 08/05/18 and Dr. Delton See was going to call her with results from labs and x-rays, whether to continue methotrexate     [phone]

## 2018-08-19 MED ORDER — METHOTREXATE SODIUM 25 MG/ML INJECTION SOLUTION
SUBCUTANEOUS | 0 refills | 0 days | Status: CP
Start: 2018-08-19 — End: 2018-11-17
  Filled 2018-08-19: qty 4, 28d supply, fill #0

## 2018-08-19 MED FILL — TRAZODONE 50 MG TABLET: 30 days supply | Qty: 30 | Fill #0 | Status: AC

## 2018-08-19 MED FILL — GABAPENTIN 300 MG CAPSULE: ORAL | 30 days supply | Qty: 90 | Fill #0

## 2018-08-19 MED FILL — TRAZODONE 50 MG TABLET: ORAL | 30 days supply | Qty: 30 | Fill #0

## 2018-08-19 MED FILL — METHOTREXATE SODIUM 25 MG/ML INJECTION SOLUTION: 28 days supply | Qty: 4 | Fill #0 | Status: AC

## 2018-08-19 MED FILL — GABAPENTIN 300 MG CAPSULE: 30 days supply | Qty: 90 | Fill #0 | Status: AC

## 2018-08-19 NOTE — Unmapped (Signed)
Methotrexate refill  Last ov: 08/05/2018  Next ov: 11/05/2018   Labs:   AST   Date Value Ref Range Status   08/05/2018 24 17 - 47 U/L Final   07/31/2014 24 14 - 38 U/L Final     ALT   Date Value Ref Range Status   08/05/2018 17 <35 U/L Final   07/31/2014 27 15 - 48 U/L Final     Creatinine   Date Value Ref Range Status   08/05/2018 0.78 0.60 - 1.00 mg/dL Final   34/74/2595 6.38 0.60 - 1.00 mg/dL Final     WBC   Date Value Ref Range Status   08/05/2018 5.5 4.5 - 11.0 10*9/L Final   07/31/2014 6.5 4.5 - 11.0 10*9/L Final     HGB   Date Value Ref Range Status   08/05/2018 14.1 12.0 - 16.0 g/dL Final   75/64/3329 51.8 12.0 - 16.0 g/dL Final     HCT   Date Value Ref Range Status   08/05/2018 44.4 36.0 - 46.0 % Final   07/31/2014 46.5 (H) 36.0 - 46.0 % Final     MCV   Date Value Ref Range Status   08/05/2018 100.5 (H) 80.0 - 100.0 fL Final   07/31/2014 100 80 - 100 fL Final     RDW   Date Value Ref Range Status   08/05/2018 15.1 (H) 12.0 - 15.0 % Final   07/31/2014 14.9 12.0 - 15.0 % Final     Platelet   Date Value Ref Range Status   08/05/2018 294 150 - 440 10*9/L Final   07/31/2014 261 150 - 440 10*9/L Final     Neutrophils %   Date Value Ref Range Status   08/05/2018 52.2 % Final     Lymphocytes %   Date Value Ref Range Status   08/05/2018 36.4 % Final     Monocytes %   Date Value Ref Range Status   08/05/2018 5.7 % Final     Eosinophils %   Date Value Ref Range Status   08/05/2018 2.0 % Final     Basophils %   Date Value Ref Range Status   08/05/2018 0.7 % Final

## 2018-08-20 NOTE — Unmapped (Signed)
Patient picked up the methotrexate in [place]    No medications needed at this time    Rescheduling refill call    Everlean Cherry CPHT

## 2018-08-26 MED ORDER — TIOTROPIUM BROMIDE 18 MCG CAPSULE WITH INHALATION DEVICE
ORAL_CAPSULE | 11 refills | 0 days | Status: CP
Start: 2018-08-26 — End: 2018-10-25

## 2018-08-26 MED ORDER — ALBUTEROL SULFATE HFA 90 MCG/ACTUATION AEROSOL INHALER
Freq: Four times a day (QID) | RESPIRATORY_TRACT | 6 refills | 0 days | Status: CP | PRN
Start: 2018-08-26 — End: 2019-08-26
  Filled 2018-08-27: qty 54, 75d supply, fill #0

## 2018-08-26 MED FILL — DULOXETINE 60 MG CAPSULE,DELAYED RELEASE: ORAL | 30 days supply | Qty: 60 | Fill #1

## 2018-08-26 MED FILL — DULOXETINE 60 MG CAPSULE,DELAYED RELEASE: 30 days supply | Qty: 60 | Fill #1 | Status: AC

## 2018-08-27 MED FILL — ALBUTEROL SULFATE HFA 90 MCG/ACTUATION AEROSOL INHALER: 75 days supply | Qty: 54 | Fill #0 | Status: AC

## 2018-09-13 MED FILL — TRAZODONE 50 MG TABLET: 16 days supply | Qty: 16 | Fill #1 | Status: AC

## 2018-09-13 MED FILL — TRAZODONE 50 MG TABLET: ORAL | 16 days supply | Qty: 16 | Fill #1

## 2018-09-13 MED FILL — GABAPENTIN 300 MG CAPSULE: ORAL | 30 days supply | Qty: 90 | Fill #1

## 2018-09-13 MED FILL — METHOTREXATE SODIUM 25 MG/ML INJECTION SOLUTION: SUBCUTANEOUS | 28 days supply | Qty: 4 | Fill #1

## 2018-09-13 MED FILL — METHOTREXATE SODIUM 25 MG/ML INJECTION SOLUTION: 28 days supply | Qty: 4 | Fill #1 | Status: AC

## 2018-09-13 MED FILL — GABAPENTIN 300 MG CAPSULE: 30 days supply | Qty: 90 | Fill #1 | Status: AC

## 2018-09-17 NOTE — Unmapped (Signed)
Methotrexate was filled with [place] pharmacy on 09/13/2018    Rescheduling refill call    Everlean Cherry

## 2018-09-28 NOTE — Unmapped (Signed)
As we discussed at patient's visit in Nov 2019, temporary refills were provided until she could establish with a mental health provider locally.  No further refills can be provided through rheumatology for this medication. AEN, MD

## 2018-10-03 ENCOUNTER — Encounter
Admit: 2018-10-03 | Discharge: 2018-10-03 | Disposition: A | Discharge status: Home / Self Care | Payer: MEDICARE | Attending: Family

## 2018-10-03 MED ORDER — AZITHROMYCIN 250 MG TABLET: tablet | 0 refills | 0 days | Status: AC

## 2018-10-03 MED ORDER — AZITHROMYCIN 250 MG TABLET
ORAL_TABLET | ORAL | 0 refills | 0.00000 days | Status: CP
Start: 2018-10-03 — End: 2018-10-25
  Filled 2018-10-05: qty 4, 4d supply, fill #0

## 2018-10-03 MED ORDER — PREDNISONE 20 MG TABLET
ORAL_TABLET | Freq: Every day | ORAL | 0 refills | 0.00000 days | Status: CP
Start: 2018-10-03 — End: 2018-10-03
  Filled 2018-10-05: qty 15, 5d supply, fill #0

## 2018-10-03 MED ORDER — PREDNISONE 20 MG TABLET: 60 mg | tablet | Freq: Every day | 0 refills | 0 days | Status: AC

## 2018-10-04 NOTE — Unmapped (Signed)
Emergency Department Provider Note        ED Clinical Impression     Final diagnoses:   Rhinosinusitis (Primary)       ED Assessment/Plan   Presents with URI symptoms for last 2 to 3 weeks.  Given length of illness will trial a round of antibiotics.  This would also be helpful if she has a mild COPD exacerbation.  Will prescribe steroids but encouraged her to hold off on to these until she develops worsening breathing symptoms.    History     Chief Complaint   Patient presents with   ??? Flu Like Symptoms     HPI  58 year old female with history of COPD presents emergency department with 2 to 3 weeks of upper respiratory congestion, earache, sore throat, and mild cough.  She has had occasional wheezing with this but says it has not been physically bothersome.  She does have an inhaler at home that she has not had to use.  She has had some loose stools as well.  No known ill contacts.  She has been treating the counter symptoms including Sudafed.  No much relief.    Past Medical History:   Diagnosis Date   ??? Arthritis    ??? Baker's cyst    ??? COPD (chronic obstructive pulmonary disease) (CMS-HCC)    ??? CTS (carpal tunnel syndrome)    ??? Depression    ??? Headache(784.0)    ??? Joint pain    ??? Psoriasis    ??? PTSD (post-traumatic stress disorder)    ??? Scoliosis        Past Surgical History:   Procedure Laterality Date   ??? CARPAL TUNNEL RELEASE     ??? COLONOSCOPY     ??? HYSTERECTOMY      For endometriosis, ovaries still in place   ??? KNEE SURGERY     ??? PR REVISE MEDIAN N/CARPAL TUNNEL SURG Right 02/13/2014    Procedure: NEUROPLASTY AND/OR TRANSPOSITION; MEDIAN NERVE AT CARPAL TUNNEL;  Surgeon: Abram Sander, MD;  Location: [facility];  Service: Orthopedics       Family History   Problem Relation Age of Onset   ??? COPD Mother    ??? Osteoporosis Mother    ??? Hypertension Father    ??? No Known Problems Sister    ??? No Known Problems Daughter    ??? No Known Problems Maternal Grandmother    ??? No Known Problems Maternal Grandfather    ??? No Known Problems Paternal Grandmother    ??? No Known Problems Paternal Grandfather    ??? Anesthesia problems Neg Hx    ??? Broken bones Neg Hx    ??? Cancer Neg Hx    ??? Clotting disorder Neg Hx    ??? Collagen disease Neg Hx    ??? Diabetes Neg Hx    ??? Dislocations Neg Hx    ??? Fibromyalgia Neg Hx    ??? Gout Neg Hx    ??? Hemophilia Neg Hx    ??? Rheumatologic disease Neg Hx    ??? Scoliosis Neg Hx    ??? Severe sprains Neg Hx    ??? Sickle cell anemia Neg Hx    ??? Spinal Compression Fracture Neg Hx    ??? BRCA 1/2 Neg Hx    ??? Breast cancer Neg Hx    ??? Colon cancer Neg Hx    ??? Endometrial cancer Neg Hx    ??? Ovarian cancer Neg Hx  Social History     Socioeconomic History   ??? Marital status: Single     Spouse name: None   ??? Number of children: None   ??? Years of education: None   ??? Highest education level: None   Occupational History   ??? Occupation: Glass blower/designer   Social Needs   ??? Financial resource strain: None   ??? Food insecurity:     Worry: None     Inability: None   ??? Transportation needs:     Medical: None     Non-medical: None   Tobacco Use   ??? Smoking status: Current Every Day Smoker     Packs/day: 1.50     Years: 20.00     Pack years: 30.00     Types: Cigarettes     Start date: 01/02/1974   ??? Smokeless tobacco: Never Used   Substance and Sexual Activity   ??? Alcohol use: No     Alcohol/week: 0.0 standard drinks   ??? Drug use: No   ??? Sexual activity: None   Lifestyle   ??? Physical activity:     Days per week: None     Minutes per session: None   ??? Stress: None   Relationships   ??? Social connections:     Talks on phone: None     Gets together: None     Attends religious service: None     Active member of club or organization: None     Attends meetings of clubs or organizations: None     Relationship status: None   Other Topics Concern   ??? Exercise No   ??? Living Situation No   Social History Narrative   ??? None       Review of Systems   Constitutional: Positive for fatigue.   HENT: Positive for congestion and ear pain.    Respiratory: Positive for cough.    Musculoskeletal: Positive for myalgias.   All other systems reviewed and are negative.      Physical Exam     BP 142/91  - Pulse 78  - Temp 36.4 ??C (97.6 ??F) (Oral)  - Resp 18  - Ht 160 cm (5' 3)  - Wt 61.7 kg (136 lb)  - SpO2 97%  - BMI 24.09 kg/m??     Physical Exam  Vitals signs reviewed.   Constitutional:       General: She is not in acute distress.     Appearance: She is well-developed.   HENT:      Head: Normocephalic.      Ears:      Comments: Both TMs dull but nonerythematous.  Nasal mucosa inflammation.  Clear rhinorrhea.  Mild posterior pharynx erythema.  Eyes:      General: No scleral icterus.  Neck:      Musculoskeletal: Normal range of motion and neck supple.   Cardiovascular:      Rate and Rhythm: Normal rate and regular rhythm.      Heart sounds: Normal heart sounds. No murmur. No friction rub. No gallop.    Pulmonary:      Effort: Pulmonary effort is normal. No respiratory distress.      Comments: Scant wheezing on the left.  Lungs otherwise clear.  Abdominal:      Palpations: Abdomen is soft.      Tenderness: There is no tenderness.   Skin:     General: Skin is warm and dry.   Neurological:      Mental  Status: She is alert.   Psychiatric:         Behavior: Behavior normal.         ED Course           MDM  Reviewed: vitals         Nicholes Calamity, FNP  10/03/18 1639

## 2018-10-04 NOTE — Unmapped (Signed)
Pt with 2-3 week history of cough, congestion, ear pain, chills, sore throat with decreased appetite. Some diarrhea yesterday.

## 2018-10-05 MED FILL — AZITHROMYCIN 250 MG TABLET: 4 days supply | Qty: 4 | Fill #0 | Status: AC

## 2018-10-05 MED FILL — PREDNISONE 20 MG TABLET: 5 days supply | Qty: 15 | Fill #0 | Status: AC

## 2018-10-12 NOTE — Unmapped (Signed)
[facility] Refill Coordination Note    Specialty Medication(s) to be Shipped:   Inflammatory Disorders: methotrextae (injectable)    Other medication(s) to be shipped: syringes, gabapentin and folic acid     [name], DOB: November 21, 1960  Phone: [phone] (home)       All above HIPAA information was verified with patient.     Completed refill call assessment today to schedule patient's medication shipment from the [facility] [phone]).       Specialty medication(s) and dose(s) confirmed: Regimen is correct and unchanged.   Changes to medications: [name] reports no changes reported at this time.  Changes to insurance: No  Questions for the pharmacist: No    Confirmed patient received Welcome Packet with first shipment. The patient will receive a drug information handout for each medication shipped and additional FDA Medication Guides as required.       DISEASE/MEDICATION-SPECIFIC INFORMATION        Patient is ok with methotrexate delivery on thursday- has what she needs on hand until then    SPECIALTY MEDICATION ADHERENCE     Medication Adherence    Patient reported X missed doses in the last month:  0  Specialty Medication:  methotrexate vials  Patient is on additional specialty medications:  No  Patient is on more than two specialty medications:  No  Any gaps in refill history greater than 2 weeks in the last 3 months:  no  Demonstrates understanding of importance of adherence:  yes  Informant:  patient  Reliability of informant:  reliable  Confirmed plan for next specialty medication refill:  delivery by pharmacy  Refills needed for supportive medications:  not needed          Refill Coordination    Has the Patients' Contact Information Changed:  No  Is the Shipping Address Different:  No           Methotrexate 25mg /ml injection solution: patient has 0 days on hand at this time      [facility]     Shipping address confirmed in Epic.     Delivery Scheduled: Yes, Expected medication delivery date: 1/30.     Medication will be delivered via UPS to the temporary address in Epic WAM.    Renette Butters   [facility] Specialty Technician

## 2018-10-13 MED FILL — METHOTREXATE SODIUM 25 MG/ML INJECTION SOLUTION: SUBCUTANEOUS | 28 days supply | Qty: 4 | Fill #0

## 2018-10-13 MED FILL — GABAPENTIN 300 MG CAPSULE: 16 days supply | Qty: 48 | Fill #0 | Status: AC

## 2018-10-13 MED FILL — BD TUBERCULIN SYRINGE 1 ML 27 X 1/2": SUBCUTANEOUS | 84 days supply | Qty: 12 | Fill #2

## 2018-10-13 MED FILL — FOLIC ACID 1 MG TABLET: 90 days supply | Qty: 90 | Fill #1 | Status: AC

## 2018-10-13 MED FILL — BD TUBERCULIN SYRINGE 1 ML 27 X 1/2": 84 days supply | Qty: 12 | Fill #2 | Status: AC

## 2018-10-13 MED FILL — FOLIC ACID 1 MG TABLET: ORAL | 90 days supply | Qty: 90 | Fill #1

## 2018-10-13 MED FILL — METHOTREXATE SODIUM 25 MG/ML INJECTION SOLUTION: 28 days supply | Qty: 4 | Fill #0 | Status: AC

## 2018-10-13 MED FILL — GABAPENTIN 300 MG CAPSULE: ORAL | 16 days supply | Qty: 48 | Fill #0

## 2018-10-18 MED ORDER — DULOXETINE 60 MG CAPSULE,DELAYED RELEASE
ORAL_CAPSULE | Freq: Every day | ORAL | 0 refills | 0.00000 days | Status: CP
Start: 2018-10-18 — End: 2018-10-25

## 2018-10-18 MED ORDER — TRAZODONE 50 MG TABLET
ORAL_TABLET | Freq: Every evening | ORAL | 0 refills | 0 days | Status: CP
Start: 2018-10-18 — End: 2018-10-25

## 2018-10-18 NOTE — Unmapped (Signed)
Addended by: Perlie Gold on: 10/18/2018 12:45 PM     Modules accepted: Orders

## 2018-10-25 ENCOUNTER — Ambulatory Visit: Admit: 2018-10-25 | Discharge: 2018-10-26

## 2018-10-25 DIAGNOSIS — M1990 Unspecified osteoarthritis, unspecified site: Secondary | ICD-10-CM

## 2018-10-25 DIAGNOSIS — L4050 Arthropathic psoriasis, unspecified: Secondary | ICD-10-CM

## 2018-10-25 DIAGNOSIS — Z720 Tobacco use: Secondary | ICD-10-CM

## 2018-10-25 DIAGNOSIS — F4310 Post-traumatic stress disorder, unspecified: Principal | ICD-10-CM

## 2018-10-25 DIAGNOSIS — Z1211 Encounter for screening for malignant neoplasm of colon: Secondary | ICD-10-CM

## 2018-10-25 DIAGNOSIS — R030 Elevated blood-pressure reading, without diagnosis of hypertension: Secondary | ICD-10-CM

## 2018-10-25 DIAGNOSIS — K219 Gastro-esophageal reflux disease without esophagitis: Secondary | ICD-10-CM

## 2018-10-25 DIAGNOSIS — Z1239 Encounter for other screening for malignant neoplasm of breast: Secondary | ICD-10-CM

## 2018-10-25 DIAGNOSIS — J3089 Other allergic rhinitis: Secondary | ICD-10-CM

## 2018-10-25 DIAGNOSIS — J42 Unspecified chronic bronchitis: Secondary | ICD-10-CM

## 2018-10-25 DIAGNOSIS — F332 Major depressive disorder, recurrent severe without psychotic features: Secondary | ICD-10-CM

## 2018-10-25 MED ORDER — TIOTROPIUM BROMIDE 18 MCG CAPSULE WITH INHALATION DEVICE
ORAL_CAPSULE | 3 refills | 0 days | Status: CP
Start: 2018-10-25 — End: 2018-10-25

## 2018-10-25 MED ORDER — SPIRIVA RESPIMAT 1.25 MCG/ACTUATION SOLUTION FOR INHALATION
Freq: Every day | RESPIRATORY_TRACT | 5 refills | 0.00000 days | Status: CP
Start: 2018-10-25 — End: ?
  Filled 2018-10-26: qty 4, 30d supply, fill #0

## 2018-10-25 MED ORDER — PRAZOSIN 1 MG CAPSULE
ORAL_CAPSULE | Freq: Every evening | ORAL | 3 refills | 0 days | Status: CP
Start: 2018-10-25 — End: 2018-10-27
  Filled 2018-10-25: qty 90, 90d supply, fill #0

## 2018-10-25 MED ORDER — DULOXETINE 60 MG CAPSULE,DELAYED RELEASE
ORAL_CAPSULE | Freq: Every day | ORAL | 3 refills | 0 days | Status: CP
Start: 2018-10-25 — End: 2019-04-08
  Filled 2018-10-25: qty 60, 30d supply, fill #0

## 2018-10-25 MED ORDER — FLUTICASONE PROPIONATE 50 MCG/ACTUATION NASAL SPRAY,SUSPENSION
Freq: Every day | NASAL | 4 refills | 0.00000 days | Status: CP
Start: 2018-10-25 — End: 2019-03-21
  Filled 2018-10-25: qty 16, 30d supply, fill #0

## 2018-10-25 MED ORDER — FAMOTIDINE 40 MG TABLET
ORAL_TABLET | Freq: Every evening | ORAL | 0 refills | 0 days | Status: CP
Start: 2018-10-25 — End: 2019-05-05
  Filled 2018-10-25: qty 90, 90d supply, fill #0

## 2018-10-25 MED ORDER — GABAPENTIN 300 MG CAPSULE
ORAL_CAPSULE | ORAL | 0 refills | 0 days | Status: CP
Start: 2018-10-25 — End: 2019-01-11
  Filled 2018-10-25: qty 270, 90d supply, fill #0

## 2018-10-25 MED ORDER — CETIRIZINE 10 MG TABLET
ORAL_TABLET | Freq: Every day | ORAL | 3 refills | 0.00000 days | Status: CP
Start: 2018-10-25 — End: ?
  Filled 2018-10-25: qty 90, 90d supply, fill #0

## 2018-10-25 MED FILL — PRAZOSIN 1 MG CAPSULE: 90 days supply | Qty: 90 | Fill #0 | Status: AC

## 2018-10-25 MED FILL — DULOXETINE 60 MG CAPSULE,DELAYED RELEASE: 30 days supply | Qty: 60 | Fill #0 | Status: AC

## 2018-10-25 MED FILL — CETIRIZINE 10 MG TABLET: 90 days supply | Qty: 90 | Fill #0 | Status: AC

## 2018-10-25 MED FILL — FLUTICASONE PROPIONATE 50 MCG/ACTUATION NASAL SPRAY,SUSPENSION: 30 days supply | Qty: 16 | Fill #0 | Status: AC

## 2018-10-25 MED FILL — GABAPENTIN 300 MG CAPSULE: 90 days supply | Qty: 270 | Fill #0 | Status: AC

## 2018-10-25 MED FILL — FAMOTIDINE 40 MG TABLET: 90 days supply | Qty: 90 | Fill #0 | Status: AC

## 2018-10-25 NOTE — Unmapped (Signed)
[facility]    Patient ID: Michele Miller is a 58 y.o. female who presents as a new patient for follow up of mood  PCP: Soyla Murphy, MD      Informant: Patient came to appointment alone.    Assessment/Plan:      1. PTSD (post-traumatic stress disorder)  2. Severe episode of recurrent major depressive disorder, without psychotic features (CMS-HCC)  Michele Miller is a 58 y.o. female here with severe anxiety and depressive symptoms and hx consistent with PTSD. Has had several medication trials, with none being dramatically effective. Desires benzos, which may be helpful but I will not be prescribing. As trazodone didn't help, we can try prazosin and plan for close follow up. Desires counseling, I agree this would be very helpful. Will see if she can get this in concert with psychiatric care--referral in place to help with ensuring appropriate diagnosis and assist with medication recommendations/management given multiple trials already.   - prazosin (MINIPRESS) 1 MG capsule; Take 1 capsule (1 mg total) by mouth nightly.  Dispense: 90 capsule; Refill: 3  - DULoxetine (CYMBALTA) 60 MG capsule; Take 2 capsules (120 mg total) by mouth daily.  Dispense: 60 capsule; Refill: 3  - Ambulatory referral to Psychiatry; Future    3. Psoriatic arthritis (CMS-HCC)  Follows with rheum, on methotrexate.  Will refill gabapentin and duloxetine, to help with pain.  - gabapentin (NEURONTIN) 300 MG capsule; TAKE 1 CAPSULE BY MOUTH 3 TIMES DAILY  Dispense: 270 capsule; Refill: 0  - DULoxetine (CYMBALTA) 60 MG capsule; Take 2 capsules (120 mg total) by mouth daily.  Dispense: 60 capsule; Refill: 3    4. Chronic bronchitis, unspecified chronic bronchitis type (CMS-HCC)  Stopped daily inhaler, has had some increased cough since. Using abluterol as needed. Will resume tiotropium.  - tiotropium bromide (SPIRIVA RESPIMAT) 1.25 mcg/actuation Mist; Inhale 2.5 mcg BY MOUTH daily.  Dispense: 4 g; Refill: 5    5. Non-seasonal allergic rhinitis, unspecified trigger  Gets significant benefit from nasal steroid, which she has run out of. Refilled with antihistamine.  - fluticasone propionate (FLONASE) 50 mcg/actuation nasal spray; Use 2 sprays in each nostril daily.  Dispense: 16 g; Refill: 4  - cetirizine (ZYRTEC) 10 MG tablet; Take 1 tablet (10 mg total) by mouth daily.  Dispense: 90 tablet; Refill: 3    6. Gastroesophageal reflux disease, esophagitis presence not specified  Using famotidine, refilled.  - famotidine (PEPCID) 40 MG tablet; Take 1 tablet (40 mg total) by mouth every evening.  Dispense: 360 tablet; Refill: 0    7. Osteoarthritis, unspecified osteoarthritis type, unspecified site  DDD of spine with hx psoriatic arthritis, on duloxetine presumably for pain and mood, will continue at this time.   - DULoxetine (CYMBALTA) 60 MG capsule; Take 2 capsules (120 mg total) by mouth daily.  Dispense: 60 capsule; Refill: 3    8. Tobacco use  2 ppd, reports anxiety worsening is a trigger. Not ready to discuss quitting.    9. Elevated blood pressure reading  Noted today, not prior. Will re-assess in follow-up. Suspect anxiety related.        Preventive services addressed today  Influenza: ordered  Colon screening: ordered  Mammogram: ordered    -- Patient verbalized an understanding of today's assessment and recommendations, as well as the purpose of ongoing medications.    Return in about 4 weeks (around 11/22/2018) for Recheck mood.    Subjective:     Chief Complaint  Patient presents with   ??? Anxiety       HPI  # Mood  - Lost son 4 years ago related to motor vehicle  (birthday was yesterday), he had 3 children in [place], so she moved there to care for them. Grandson hit by a car.   - Lost housing in June, living currently with an alcoholic  - Stress between family members, not employed, housing--all difficult.  - Has done counseling before, was doing trauma focused therapy, but they closed down. This was helpful and a good relationship.  - Long standing depression and anxiety, anxiety in particular is very bad  - Used to take trazodone 50mg  nightly--not helpful  - Feels she is at her wits end, would like to use benzodiazepines reports no problems with these medications in the past.    - Not sure if she has a cervix or not    - Hasn't been using spiriva, nearly out of mood medicaitons    ROS  As per HPI.    RELEVANT PAST MEDICAL, FAMILY, SURGICAL, SOCIAL HISTORY  PMH:  - Psoriatic arthritis, psoriasis  - DDD spine (cerivical and lumbar)  - COPD  - Tobacco use  - Depression, Anxiety, trauma history  - Benzodiazapine use disorder    Surgical History:  Right knee arthroscopy 20  Years ago  Hysterectomy, for heavy bleeding.   Carpal tunnel release right  Left wrist surgery    Social History:  Tobacco: 2 packs a day  Employment: working on disability    ___________________________________  CURRENT MEDS  Current Outpatient Medications   Medication Sig Dispense Refill   ??? albuterol HFA 90 mcg/actuation inhaler Inhale 2 puffs every six (6) hours as needed for wheezing. 54 g 6   ??? cetirizine (ZYRTEC) 10 MG tablet Take 1 tablet (10 mg total) by mouth daily. 90 tablet 3   ??? clobetasol (TEMOVATE) 0.05 % ointment APPLY TOPICALLY TWICE DAILY 30 g PRN   ??? diclofenac sodium (VOLTAREN) 1 % gel Apply 2 grams topically to small joint and 4 grams to large joints as needed up to 4 times daily 300 g PRN   ??? DULoxetine (CYMBALTA) 60 MG capsule Take 2 capsules (120 mg total) by mouth daily. 60 capsule 3   ??? ergocalciferol (DRISDOL) 50,000 unit capsule TAKE 1 CAPSULE BY MOUTH ONCE EACH WEEK 12 capsule 0   ??? fluticasone propionate (FLONASE) 50 mcg/actuation nasal spray Use 2 sprays in each nostril daily. 16 g 4   ??? folic acid (FOLVITE) 1 MG tablet Take 1 tablet (1 mg total) by mouth daily. 90 tablet 3   ??? gabapentin (NEURONTIN) 300 MG capsule TAKE 1 CAPSULE BY MOUTH 3 TIMES DAILY 270 capsule 0   ??? methotrexate 25 mg/mL injection solution Inject 1 mL (25 mg total) under the skin once a week. 12 mL 0   ??? needle, disp, 25 gauge 25 gauge x 5/8 Ndle USE AS DIRECTED 12 each 6   ??? polyethylene glycol (MIRALAX) 17 gram packet Take 17 g by mouth daily. 10 packet 0   ??? syringe with needle 1 mL 27 x 1/2 Syrg USE AS DIRECTED FOR WEEKLY METHOTREXATE INJECTIONS 50 each 0   ??? tiZANidine (ZANAFLEX) 4 MG tablet TAKE 1 TABLET (4 MG TOTAL) BY MOUTH THREE (3) TIMES A DAY AS NEEDED. 270 tablet 1   ??? famotidine (PEPCID) 40 MG tablet Take 1 tablet (40 mg total) by mouth every evening. 360 tablet 0   ??? prazosin (MINIPRESS) 1 MG  capsule Take 1 capsule (1 mg total) by mouth nightly. 90 capsule 3   ??? tiotropium bromide (SPIRIVA RESPIMAT) 1.25 mcg/actuation Mist Inhale 2.5 mcg BY MOUTH daily. 4 g 5     No current facility-administered medications for this visit.        ___________________________________  ALLERGIES  No Known Allergies     The following portions of the patient's history were reviewed and updated as appropriate: allergies, current medications, past medical history, past social history, past surgical history and problem list.    Objective:       Vital Signs  BP 141/91 (BP Site: R Arm, BP Position: Sitting, BP Cuff Size: Medium)  - Pulse 80  - Temp 36.6 ??C (97.8 ??F) (Oral)  - Wt 61.7 kg (136 lb)  - BMI 24.09 kg/m??      Exam  Physical Exam   Constitutional: She is oriented to person, place, and time. She appears well-developed and well-nourished.   HENT:   Head: Normocephalic and atraumatic.   Eyes: Conjunctivae are normal. Right eye exhibits no discharge. Left eye exhibits no discharge.   Cardiovascular: Normal rate, regular rhythm and normal heart sounds.   No murmur heard.  Pulmonary/Chest: Effort normal and breath sounds normal. No respiratory distress. She has no wheezes. She has no rales.   Neurological: She is alert and oriented to person, place, and time.   Skin: Skin is warm and dry.   Psychiatric:   Very anxious, some preservation on anxiety medicaitons   Nursing note and vitals reviewed.       Data  PHQ-9 Score:  PHQ-9 TOTAL SCORE: 22  GAD7 Total Score GAD-7 Total Score   10/25/2018 19

## 2018-10-25 NOTE — Unmapped (Signed)
Thank you for visiting the [facility] for your care today. It was a pleasure to see you.       - Start prazosin (minipress) at night, take one pill nightly for a few nights. If no bad side effects, can increase to 2 at night.  - re-start Spiriva, every day  - Schedule mammogram and colonoscopy

## 2018-10-26 MED FILL — SPIRIVA RESPIMAT 1.25 MCG/ACTUATION SOLUTION FOR INHALATION: 30 days supply | Qty: 4 | Fill #0 | Status: AC

## 2018-10-27 MED ORDER — TRAZODONE 50 MG TABLET
ORAL_TABLET | Freq: Every evening | ORAL | 3 refills | 0 days | Status: CP
Start: 2018-10-27 — End: 2018-12-30
  Filled 2018-10-27: qty 90, 90d supply, fill #0

## 2018-10-27 MED FILL — TRAZODONE 50 MG TABLET: 90 days supply | Qty: 90 | Fill #0 | Status: AC

## 2018-10-27 NOTE — Unmapped (Signed)
Thanks, informed patient the medication was sent

## 2018-10-27 NOTE — Unmapped (Signed)
Thanks for the update. Please call to let patient know:     I have sent the trazodone to the [facility], so she can call to get it filled.    [name] Darlen Gledhill

## 2018-10-27 NOTE — Unmapped (Signed)
No contact call

## 2018-10-27 NOTE — Unmapped (Signed)
Patient Advice Request - Ancillary Staff [facility] Name and relation (if other than patient): self  Question/Concern for provider (Specific): Patient states the doctor prescribed prazosin, and yesterday (10/26/18) was the first time she took it, and it made her faint three times last night. Patient is wanting the doctor to put her back on trazadone 50mg  tablets instead  Best Contact Time: [phone]  Other info: n/a

## 2018-10-27 NOTE — Unmapped (Signed)
Reason for Disposition  ??? Lightheadedness (dizziness) present now, after 2 hours of rest and fluids    Answer Assessment - Initial Assessment Questions  Recent:   What is the date of your last related visit?  2/11 Dr. Laurance Flatten anxiety, depression, PTSD  Related acute medications Rx'd:  Prazosin  Home treatment tried:  N/A    Relevant:   Allergies: N/A  Medications: N/A  Health History: PTSD, - DDD spine (cerivical and lumbar)  - COPD  - Tobacco use  - Depression, Anxiety, trauma history    Weight: N/A         1. DESCRIPTION: Describe your dizziness.      I am feeling a little lightheaded  2. LIGHTHEADED: Do you feel lightheaded? (e.g., somewhat faint, woozy, weak upon standing)      Yes  3. VERTIGO: Do you feel like either you or the room is spinning or tilting? (i.e. vertigo)      No  4. SEVERITY: How bad is it?  Do you feel like you are going to faint? Can you stand and walk?    - MILD - walking normally    - MODERATE - interferes with normal activities (e.g., work, school)     - SEVERE - unable to stand, requires support to walk, feels like passing out now.       I am able to walk normally.  5. ONSET:  When did the dizziness begin?      9 p.m. took first dose of Prazosin within the hour, I became dizzy and fainted, short time afterwards had 2 additional episodes of fainting.  6. AGGRAVATING FACTORS: Does anything make it worse? (e.g., standing, change in head position)      Nothing makes it worse, I am able to stand. I am not going to take anymore Prazosin.  7. HEART RATE: Can you tell me your heart rate? How many beats in 15 seconds?  (Note: not all patients can do this)       Unable to do so.  8. CAUSE: What do you think is causing the dizziness?      The Prazosin  9. RECURRENT SYMPTOM: Have you had dizziness before? If so, ask: When was the last time? What happened that time?      Never had this happen before  10. OTHER SYMPTOMS: Do you have any other symptoms? (e.g., fever, chest pain, vomiting, diarrhea, bleeding)        I feel on the cement floor and hit cheek. My left cheek is a little swollen, no bruising, it is tender to touch.  11. PREGNANCY: Is there any chance you are pregnant? When was your last menstrual period?        No. S/p partial hysterectomy.    Protocols used: DIZZINESS-ADULT-[place]

## 2018-11-01 ENCOUNTER — Ambulatory Visit: Admit: 2018-11-01 | Discharge: 2018-11-02 | Attending: Family Medicine | Primary: Family Medicine

## 2018-11-01 DIAGNOSIS — F4310 Post-traumatic stress disorder, unspecified: Principal | ICD-10-CM

## 2018-11-01 DIAGNOSIS — K5904 Chronic idiopathic constipation: Secondary | ICD-10-CM

## 2018-11-01 DIAGNOSIS — K589 Irritable bowel syndrome without diarrhea: Secondary | ICD-10-CM | POA: Insufficient documentation

## 2018-11-01 MED ORDER — BUPROPION HCL SR 150 MG TABLET,12 HR SUSTAINED-RELEASE
ORAL_TABLET | Freq: Two times a day (BID) | ORAL | 2 refills | 0.00000 days | Status: CP
Start: 2018-11-01 — End: 2019-04-08
  Filled 2018-11-02: qty 60, 30d supply, fill #0

## 2018-11-01 MED ORDER — POLYETHYLENE GLYCOL 3350 17 GRAM ORAL POWDER PACKET
PACK | Freq: Every day | ORAL | 3 refills | 0 days | Status: CP
Start: 2018-11-01 — End: ?
  Filled 2018-11-02: qty 30, 30d supply, fill #0

## 2018-11-01 MED ORDER — HYDROXYZINE HCL 25 MG TABLET
ORAL_TABLET | Freq: Three times a day (TID) | ORAL | 1 refills | 0 days | Status: CP | PRN
Start: 2018-11-01 — End: 2018-12-30
  Filled 2018-11-02: qty 90, 30d supply, fill #0

## 2018-11-02 MED FILL — POLYETHYLENE GLYCOL 3350 17 GRAM ORAL POWDER PACKET: 30 days supply | Qty: 30 | Fill #0 | Status: AC

## 2018-11-02 MED FILL — HYDROXYZINE HCL 25 MG TABLET: 30 days supply | Qty: 90 | Fill #0 | Status: AC

## 2018-11-02 MED FILL — BUPROPION HCL SR 150 MG TABLET,12 HR SUSTAINED-RELEASE: 30 days supply | Qty: 60 | Fill #0 | Status: AC

## 2018-11-02 NOTE — Unmapped (Signed)
Patient Education        Post-Traumatic Stress Disorder (PTSD): Care Instructions  Your Care Instructions    Post-traumatic stress disorder (PTSD) is a mental condition that can result from being in or seeing a traumatic or terrifying event. These events can include combat, a terrorist attack, a natural disaster, a serious accident, an assault, or a rape. If you have PTSD, you may often relive the experience in nightmares or flashbacks. These are clear and frightening memories of the event. You may also have trouble sleeping.  PTSD affects people in very different ways. It can interfere with daily activities such as work or school, and it can make you withdraw from friends or loved ones.  Follow-up care is a key part of your treatment and safety. Be sure to make and go to all appointments, and call your doctor if you are having problems. It's also a good idea to know your test results and keep a list of the medicines you take.  How can you care for yourself at home?  ?? Take medicines exactly as directed. Call your doctor if you think you are having a problem with your medicine.  ?? Go to your counseling sessions and follow-up appointments.  ?? Recognize and accept your anxiety. Then, when you are in a situation that makes you anxious, say to yourself, This is not an emergency. I feel uncomfortable, but I am not in danger. I can keep going even if I feel anxious.  ?? Be kind to your body:  ? Relieve tension with exercise or a massage.  ? Get enough rest.  ? Avoid alcohol, caffeine, nicotine, and illegal drugs. They can increase your anxiety level and cause sleep problems.  ? Learn and do relaxation techniques. See below for more about these techniques.  ?? Engage your mind. Get out and do something you enjoy. Go to a funny movie, or take a walk or hike. Plan your day. Having too much or too little to do can make you anxious.  ?? Keep a record of your symptoms. Discuss your fears with a good friend or family member, or join a support group for people with similar problems. Talking to others sometimes relieves stress.  ?? Get involved in social groups, or volunteer to help others. Being alone sometimes makes things seem worse than they are.  ?? Get at least 30 minutes of exercise on most days of the week. Walking is a good choice. You also may want to do other activities, such as running, swimming, cycling, or playing tennis or team sports.  ?? Keep the numbers for these national suicide hotlines: 1-800-273-TALK 908-121-6441) and 1-800-SUICIDE (917) 726-9869). If you or someone you know talks about suicide or feeling hopeless, get help right away.  Relaxation techniques  Do relaxation exercises 10 to 20 minutes a day. You can play soothing, relaxing music while you do them, if you wish.  ?? Tell others in your house that you are going to do your relaxation exercises. Ask them not to disturb you.  ?? Find a comfortable place, away from all distractions and noise.  ?? Lie down on your back, or sit with your back straight.  ?? Focus on your breathing. Make it slow and steady.  ?? Breathe in through your nose. Breathe out through either your nose or mouth.  ?? Breathe deeply, filling up the area between your navel and your rib cage. Breathe so that your belly goes up and down.  ?? Do not hold  your breath.  ?? Breathe like this for 5 to 10 minutes. Notice the feeling of calmness throughout your whole body.  As you continue to breathe slowly and deeply, relax by doing the following for another 5 to 10 minutes:  ?? Tighten and relax each muscle group in your body. You can begin at your toes and work your way up to your head.  ?? Imagine your muscle groups relaxing and becoming heavy.  ?? Empty your mind of all thoughts.  ?? Let yourself relax more and more deeply.  ?? Become aware of the state of calmness that surrounds you.  ?? When your relaxation time is over, you can bring yourself back to alertness by moving your fingers and toes and then your hands and feet and then stretching and moving your entire body. Sometimes people fall asleep during relaxation, but they usually wake up shortly afterward.  ?? Always give yourself time to return to full alertness before you drive a car or do anything that might cause an accident if you are not fully alert. Never play a relaxation tape while you drive a car.  When should you call for help?  Call 911 anytime you think you may need emergency care. For example, call if:  ?? ?? You feel you cannot stop from hurting yourself or someone else.   ??Watch closely for changes in your health, and be sure to contact your doctor if:  ?? ?? Your PTSD symptoms are getting worse.   ?? ?? You have new or worsening symptoms of anxiety.   ?? ?? You are not getting better as expected.   Where can you learn more?  Go to [facility] at https://myuncchart.org  [facility] [employer]. Enter ([phone] [phone] in the search box to learn more about Post-Traumatic Stress Disorder (PTSD): Care Instructions.  Current as of: Feb 09, 2018  Content Version: 12.3  ?? 2006-2019 [employer]. Care instructions adapted under license by [facility]. If you have questions about a medical condition or this instruction, always ask your healthcare professional. [employer] disclaims any warranty or liability for your use of this information.

## 2018-11-02 NOTE — Unmapped (Deleted)
Assessment/ Plan: Michele Miller is a 58 y.o. female presenting for:    Problem List Items Addressed This Visit     None            RETURN TO CARE:  Future Appointments   Date Time Provider Department Center   11/05/2018  1:00 PM Staci Righter, [place] [facility]   11/23/2018 10:20 AM Soyla Murphy, MD [facility]   11/29/2018  3:00 PM Birdie Sons, NP [facility]   12/14/2018  1:20 PM Nichola Sizer, MD [facility]   03/03/2019 10:40 AM Corena Pilgrim, MD [facility]       Subjective:    HPI: Michele Miller is a 58 y.o. female    Seen last week by her new PCP, Dr. Laurance Flatten.      Looking today for something to take the edge off.    Was previously seen by [facility] for outpatient therapy.     Living situation is tenuous.        Current Outpatient Medications on File Prior to Visit   Medication Sig Dispense Refill   ??? albuterol HFA 90 mcg/actuation inhaler Inhale 2 puffs every six (6) hours as needed for wheezing. 54 g 6   ??? cetirizine (ZYRTEC) 10 MG tablet Take 1 tablet (10 mg total) by mouth daily. 90 tablet 3   ??? clobetasol (TEMOVATE) 0.05 % ointment APPLY TOPICALLY TWICE DAILY 30 g PRN   ??? diclofenac sodium (VOLTAREN) 1 % gel Apply 2 grams topically to small joint and 4 grams to large joints as needed up to 4 times daily 300 g PRN   ??? DULoxetine (CYMBALTA) 60 MG capsule Take 2 capsules (120 mg total) by mouth daily. 60 capsule 3   ??? famotidine (PEPCID) 40 MG tablet Take 1 tablet (40 mg total) by mouth every evening. 360 tablet 0   ??? fluticasone propionate (FLONASE) 50 mcg/actuation nasal spray Use 2 sprays in each nostril daily. 16 g 4   ??? folic acid (FOLVITE) 1 MG tablet Take 1 tablet (1 mg total) by mouth daily. 90 tablet 3   ??? gabapentin (NEURONTIN) 300 MG capsule TAKE 1 CAPSULE BY MOUTH 3 TIMES DAILY 270 capsule 0   ??? methotrexate 25 mg/mL injection solution Inject 1 mL (25 mg total) under the skin once a week. 12 mL 0   ??? needle, disp, 25 gauge 25 gauge x 5/8 Ndle USE AS DIRECTED 12 each 6   ??? polyethylene glycol (MIRALAX) 17 gram packet Take 17 g by mouth daily. 10 packet 0   ??? syringe with needle 1 mL 27 x 1/2 Syrg USE AS DIRECTED FOR WEEKLY METHOTREXATE INJECTIONS 50 each 0   ??? tiotropium bromide (SPIRIVA RESPIMAT) 1.25 mcg/actuation Mist Inhale 2 puffs (2.5 mcg) BY MOUTH daily. 4 g 5   ??? tiZANidine (ZANAFLEX) 4 MG tablet TAKE 1 TABLET (4 MG TOTAL) BY MOUTH THREE (3) TIMES A DAY AS NEEDED. 270 tablet 1   ??? traZODone (DESYREL) 50 MG tablet Take 1 tablet (50 mg total) by mouth nightly. 90 tablet 3   ??? ergocalciferol (DRISDOL) 50,000 unit capsule TAKE 1 CAPSULE BY MOUTH ONCE EACH WEEK (Patient not taking: Reported on 11/01/2018) 12 capsule 0     No current facility-administered medications on file prior to visit.        No Known Allergies    Past Medical History:   Diagnosis Date   ??? Arthritis    ???  Baker's cyst    ??? COPD (chronic obstructive pulmonary disease) (CMS-HCC)    ??? CTS (carpal tunnel syndrome)    ??? Depression    ??? Headache(784.0)    ??? Joint pain    ??? Psoriasis    ??? PTSD (post-traumatic stress disorder)    ??? Scoliosis      Past Surgical History:   Procedure Laterality Date   ??? CARPAL TUNNEL RELEASE     ??? COLONOSCOPY     ??? HYSTERECTOMY      For endometriosis, ovaries still in place   ??? KNEE SURGERY     ??? PR REVISE MEDIAN N/CARPAL TUNNEL SURG Right 02/13/2014    Procedure: NEUROPLASTY AND/OR TRANSPOSITION; MEDIAN NERVE AT CARPAL TUNNEL;  Surgeon: Abram Sander, MD;  Location: ASC OR [facility];  Service: Orthopedics     Family History   Problem Relation Age of Onset   ??? COPD Mother    ??? Osteoporosis Mother    ??? Hypertension Father    ??? No Known Problems Sister    ??? No Known Problems Daughter    ??? No Known Problems Maternal Grandmother    ??? No Known Problems Maternal Grandfather    ??? No Known Problems Paternal Grandmother    ??? No Known Problems Paternal Grandfather    ??? Anesthesia problems Neg Hx    ??? Broken bones Neg Hx    ??? Cancer Neg Hx    ??? Clotting disorder Neg Hx    ??? Collagen disease Neg Hx    ??? Diabetes Neg Hx    ??? Dislocations Neg Hx    ??? Fibromyalgia Neg Hx    ??? Gout Neg Hx    ??? Hemophilia Neg Hx    ??? Rheumatologic disease Neg Hx    ??? Scoliosis Neg Hx    ??? Severe sprains Neg Hx    ??? Sickle cell anemia Neg Hx    ??? Spinal Compression Fracture Neg Hx    ??? BRCA 1/2 Neg Hx    ??? Breast cancer Neg Hx    ??? Colon cancer Neg Hx    ??? Endometrial cancer Neg Hx    ??? Ovarian cancer Neg Hx      Social History     Socioeconomic History   ??? Marital status: Single     Spouse name: Not on file   ??? Number of children: Not on file   ??? Years of education: Not on file   ??? Highest education level: Not on file   Occupational History   ??? Occupation: Glass blower/designer   Social Needs   ??? Financial resource strain: Not on file   ??? Food insecurity     Worry: Not on file     Inability: Not on file   ??? Transportation needs     Medical: Not on file     Non-medical: Not on file   Tobacco Use   ??? Smoking status: Current Every Day Smoker     Packs/day: 1.50     Years: 20.00     Pack years: 30.00     Types: Cigarettes     Start date: 01/02/1974   ??? Smokeless tobacco: Never Used   Substance and Sexual Activity   ??? Alcohol use: No     Alcohol/week: 0.0 standard drinks   ??? Drug use: No   ??? Sexual activity: Not on file   Lifestyle   ??? Physical activity     Days per week: Not on file  Minutes per session: Not on file   ??? Stress: Not on file   Relationships   ??? Social Wellsite geologist on phone: Not on file     Gets together: Not on file     Attends religious service: Not on file     Active member of club or organization: Not on file     Attends meetings of clubs or organizations: Not on file     Relationship status: Not on file   Other Topics Concern   ??? Exercise No   ??? Living Situation No   Social History Narrative   ??? Not on file         ROS: The balance of 12 systems were reviewed and negative except as indicated in the HPI    Objective:    PE: BP 107/82  - Pulse 77  - Temp 36.6 ??C (97.9 ??F) (Oral)  - Ht 162.6 cm (5' 4)  - Wt 63.3 kg (139 lb 9.6 oz)  - BMI 23.96 kg/m??   Gen: well appearing, non-toxic, no acute distress.   Head: atraumatic  Eyes: sclera anicteric  Ears/Nose: nares patent without discharge, turbinates pink without hypertrophyTM's with normal landmarks and without effusion, no injection  Throat: OP clear, MMM, Tonsils without hypertrophy, injection or purulence  Neck: supple without lymphadenopathy, No thyromegaly  Heart: RRR, no M/R/G. Pulses 2+ and equal b/l  Lungs: CTAB, no wheezes, rales or rhonchi, equal BS b/l  Abdomen: +BS, soft, NT and ND, no rebound/guarding, No HSM or masses palpated, no pulsatile mass  Extremities: no clubbing, cyanosis, or edema.   Skin: warm, well perfused, no rashes or lesions  Psychiatry: A&O x 3. Normal affect. Normal speech and thought content.    Neurological: CN 2-12 grossly intact. PERRLA. Cerebellar exam normal. Rhomberg negative, no ataxia or antalgic gait, DTR's 2+ and equal b/l, MS 5/5 and equal b/l,

## 2018-11-02 NOTE — Unmapped (Signed)
While she has Psych therapy scheduled in March, she is looking for help between now and then.  I've asked about [facility] here at [facility], there is a wait list but I've initiated a referral to which she has agreed.   Also asked Aundra Millet, our pop health specialist to begin reaching out to her to listen and help as we can.  From a medication standpoint, we will continue cymbalta now at 120mg . Explained that we can't increase dosing, would actually consider cutting back to 60mg .  -Add wellbutrin for adjunct treatment. I explained that her situations can't be fixed with medications but together this may help her cope.   -Refused benzos again today, explained this is not a long term plan and would not recommend use.   -Did write for hydroxyzine to use prn (no more than 3 times/day)  -Recommended only using trazodone if she does not need or use the hydroxyzine but wants something to aid in sleep.   Discussed potential polypharmacy effects today.

## 2018-11-02 NOTE — Unmapped (Signed)
Likely related to her anxiety and perhaps diet  Miralax has worked for her in the past, refilled this today

## 2018-11-02 NOTE — Unmapped (Signed)
Assessment/ Plan: Michele Miller is a 58 y.o. female presenting for:    Problem List Items Addressed This Visit        Digestive    Chronic idiopathic constipation     Likely related to her anxiety and perhaps diet  Miralax has worked for her in the past, refilled this today             Other    PTSD (post-traumatic stress disorder) - Primary     While she has Psych therapy scheduled in March, she is looking for help between now and then.  I've asked about [facility] here at [facility], there is a wait list but I've initiated a referral to which she has agreed.   Also asked Aundra Millet, our pop health specialist to begin reaching out to her to listen and help as we can.  From a medication standpoint, we will continue cymbalta now at 120mg . Explained that we can't increase dosing, would actually consider cutting back to 60mg .  -Add wellbutrin for adjunct treatment. I explained that her situations can't be fixed with medications but together this may help her cope.   -Refused benzos again today, explained this is not a long term plan and would not recommend use.   -Did write for hydroxyzine to use prn (no more than 3 times/day)  -Recommended only using trazodone if she does not need or use the hydroxyzine but wants something to aid in sleep.   Discussed potential polypharmacy effects today.          Relevant Medications    buPROPion (WELLBUTRIN SR) 150 MG 12 hr tablet    Other Relevant Orders    Ambulatory referral to Social Work            RETURN TO CARE:  Future Appointments   Date Time Provider Department Center   11/05/2018  1:00 PM Staci Righter, [place] [facility]   11/23/2018 10:20 AM Soyla Murphy, MD [facility]   11/29/2018  3:00 PM Birdie Sons, NP [facility]   12/14/2018  1:20 PM Nichola Sizer, MD [facility]   03/03/2019 10:40 AM Corena Pilgrim, MD [facility]       Subjective:    HPI: Michele Miller is a 58 y.o. female    Seen last week by her new PCP, Dr. Laurance Flatten.        Was also started on prazosin. She passed out 3 times with this agent. Hit the floor and her cheek still hurts  Didn't understand why this was started.   Tells me she does have PTSD from her son's death. Son was her best friend. He had 3 children. She tells me the story of his death and how traumatic that was for her.     Then lost her home. She had been there for 4 years. New real estate agency took over, had 10 days to get out. Had no where to go- her mother invited her to come but took the invite back because her brother didn't want her to come.   She ended up sleeping in a truck stop parking lot. This was in June.   Stayed with her daughter in law in [place]. Had a lot of trauma there as well.   Tells me all of this to help me explain why her PTSD comes back.     Lost her storage units.     Living situation is tenuous.  Staying  with an alcoholic.     She did come back for her hearing in October. Had disability hearing for her RA.   Continues to have a lot of pain in back, fingers, and neck.   Does have rheumatologist here.     Unclear of her plan for next steps.     Cymbalta did help her for some time.  Now taking     Looking today for something to take the edge off.    Was previously seen by [facility] for outpatient therapy.     Initiating visit for Behavioral Health Care Management    ?? Assessed behavioral health needs and concerns.   ?? Based on this assessment this patient has a diagnosed psychiatric disorder or behavioral health condition and would benefit from [facility]. This will include: behavioral care assessment, behavioral health care planning and provision of brief interventions.  ?? Provided education on the Behavioral Health Care Management Program  ?? Obtained verbal consent for enrollment in  Behavioral health Care Management. (including consent to consult with relevant specialist and cost sharing for both in-person and non-face-to-face services provided)  ?? Enrolled patient in behavioral health care management.     I will provide oversight and management of this service, including reviewing treatment progress and modifying the care plan as needed.      Current Outpatient Medications on File Prior to Visit   Medication Sig Dispense Refill   ??? albuterol HFA 90 mcg/actuation inhaler Inhale 2 puffs every six (6) hours as needed for wheezing. 54 g 6   ??? cetirizine (ZYRTEC) 10 MG tablet Take 1 tablet (10 mg total) by mouth daily. 90 tablet 3   ??? clobetasol (TEMOVATE) 0.05 % ointment APPLY TOPICALLY TWICE DAILY 30 g PRN   ??? diclofenac sodium (VOLTAREN) 1 % gel Apply 2 grams topically to small joint and 4 grams to large joints as needed up to 4 times daily 300 g PRN   ??? DULoxetine (CYMBALTA) 60 MG capsule Take 2 capsules (120 mg total) by mouth daily. 60 capsule 3   ??? famotidine (PEPCID) 40 MG tablet Take 1 tablet (40 mg total) by mouth every evening. 360 tablet 0   ??? fluticasone propionate (FLONASE) 50 mcg/actuation nasal spray Use 2 sprays in each nostril daily. 16 g 4   ??? folic acid (FOLVITE) 1 MG tablet Take 1 tablet (1 mg total) by mouth daily. 90 tablet 3   ??? gabapentin (NEURONTIN) 300 MG capsule TAKE 1 CAPSULE BY MOUTH 3 TIMES DAILY 270 capsule 0   ??? methotrexate 25 mg/mL injection solution Inject 1 mL (25 mg total) under the skin once a week. 12 mL 0   ??? needle, disp, 25 gauge 25 gauge x 5/8 Ndle USE AS DIRECTED 12 each 6   ??? syringe with needle 1 mL 27 x 1/2 Syrg USE AS DIRECTED FOR WEEKLY METHOTREXATE INJECTIONS 50 each 0   ??? tiotropium bromide (SPIRIVA RESPIMAT) 1.25 mcg/actuation Mist Inhale 2 puffs (2.5 mcg) BY MOUTH daily. 4 g 5   ??? tiZANidine (ZANAFLEX) 4 MG tablet TAKE 1 TABLET (4 MG TOTAL) BY MOUTH THREE (3) TIMES A DAY AS NEEDED. 270 tablet 1   ??? traZODone (DESYREL) 50 MG tablet Take 1 tablet (50 mg total) by mouth nightly. 90 tablet 3   ??? ergocalciferol (DRISDOL) 50,000 unit capsule TAKE 1 CAPSULE BY MOUTH ONCE EACH WEEK (Patient not taking: Reported on 11/01/2018) 12 capsule 0     No current facility-administered  medications on file prior to visit.        No Known Allergies    Past Medical History:   Diagnosis Date   ??? Arthritis    ??? Baker's cyst    ??? COPD (chronic obstructive pulmonary disease) (CMS-HCC)    ??? CTS (carpal tunnel syndrome)    ??? Depression    ??? Headache(784.0)    ??? Joint pain    ??? Psoriasis    ??? PTSD (post-traumatic stress disorder)    ??? Scoliosis      Past Surgical History:   Procedure Laterality Date   ??? CARPAL TUNNEL RELEASE     ??? COLONOSCOPY     ??? HYSTERECTOMY      For endometriosis, ovaries still in place   ??? KNEE SURGERY     ??? PR REVISE MEDIAN N/CARPAL TUNNEL SURG Right 02/13/2014    Procedure: NEUROPLASTY AND/OR TRANSPOSITION; MEDIAN NERVE AT CARPAL TUNNEL;  Surgeon: Abram Sander, MD;  Location: ASC OR [facility];  Service: Orthopedics     Family History   Problem Relation Age of Onset   ??? COPD Mother    ??? Osteoporosis Mother    ??? Hypertension Father    ??? No Known Problems Sister    ??? No Known Problems Daughter    ??? No Known Problems Maternal Grandmother    ??? No Known Problems Maternal Grandfather    ??? No Known Problems Paternal Grandmother    ??? No Known Problems Paternal Grandfather    ??? Anesthesia problems Neg Hx    ??? Broken bones Neg Hx    ??? Cancer Neg Hx    ??? Clotting disorder Neg Hx    ??? Collagen disease Neg Hx    ??? Diabetes Neg Hx    ??? Dislocations Neg Hx    ??? Fibromyalgia Neg Hx    ??? Gout Neg Hx    ??? Hemophilia Neg Hx    ??? Rheumatologic disease Neg Hx    ??? Scoliosis Neg Hx    ??? Severe sprains Neg Hx    ??? Sickle cell anemia Neg Hx    ??? Spinal Compression Fracture Neg Hx    ??? BRCA 1/2 Neg Hx    ??? Breast cancer Neg Hx    ??? Colon cancer Neg Hx    ??? Endometrial cancer Neg Hx    ??? Ovarian cancer Neg Hx      Social History     Socioeconomic History   ??? Marital status: Single     Spouse name: Not on file   ??? Number of children: Not on file   ??? Years of education: Not on file   ??? Highest education level: Not on file Occupational History   ??? Occupation: Glass blower/designer   Social Needs   ??? Financial resource strain: Not on file   ??? Food insecurity     Worry: Not on file     Inability: Not on file   ??? Transportation needs     Medical: Not on file     Non-medical: Not on file   Tobacco Use   ??? Smoking status: Current Every Day Smoker     Packs/day: 1.50     Years: 20.00     Pack years: 30.00     Types: Cigarettes     Start date: 01/02/1974   ??? Smokeless tobacco: Never Used   Substance and Sexual Activity   ??? Alcohol use: No     Alcohol/week: 0.0 standard drinks   ???  Drug use: No   ??? Sexual activity: Not on file   Lifestyle   ??? Physical activity     Days per week: Not on file     Minutes per session: Not on file   ??? Stress: Not on file   Relationships   ??? Social Wellsite geologist on phone: Not on file     Gets together: Not on file     Attends religious service: Not on file     Active member of club or organization: Not on file     Attends meetings of clubs or organizations: Not on file     Relationship status: Not on file   Other Topics Concern   ??? Exercise No   ??? Living Situation No   Social History Narrative   ??? Not on file         ROS: The balance of 12 systems were reviewed and negative except as indicated in the HPI    Objective:    PE: BP 107/82  - Pulse 77  - Temp 36.6 ??C (97.9 ??F) (Oral)  - Ht 162.6 cm (5' 4)  - Wt 63.3 kg (139 lb 9.6 oz)  - BMI 23.96 kg/m??   Gen: well appearing, non-toxic, no acute distress.   Head: atraumatic  Eyes: sclera anicteric  Ears/Nose: nares patent without discharge  Psychiatry: A&O x 3. Normal affect. Normal speech and thought content.  Intermittently tearful   Neurological: CN 2-12 grossly intact.

## 2018-11-15 NOTE — Unmapped (Signed)
[facility] Clinical Assessment Telephone Call   Medication: subq methotrexate    Unable to reach patient to complete required clinical assessment for subq methotrexate filled through the [facility].     First attempt to reach patient (11/15/18). Left voicemail. Patient may call me back at [phone] or [phone] (Option 4, request for [employer]) to complete clinical assessment.    [name]

## 2018-11-17 DIAGNOSIS — L4050 Arthropathic psoriasis, unspecified: Principal | ICD-10-CM

## 2018-11-17 MED ORDER — TIZANIDINE 4 MG TABLET
ORAL_TABLET | Freq: Three times a day (TID) | ORAL | 0 refills | 0 days | Status: CP | PRN
Start: 2018-11-17 — End: 2019-02-14
  Filled 2018-11-17: qty 90, 30d supply, fill #0

## 2018-11-17 MED ORDER — METHOTREXATE SODIUM 25 MG/ML INJECTION SOLUTION
SUBCUTANEOUS | 2 refills | 0.00000 days | Status: CP
Start: 2018-11-17 — End: 2019-02-10
  Filled 2018-11-17: qty 4, 28d supply, fill #0

## 2018-11-17 MED ORDER — EMPTY CONTAINER
Freq: Once | 3 refills | 0.00000 days | Status: CP | PRN
Start: 2018-11-17 — End: ?
  Filled 2018-11-17: qty 1, 120d supply, fill #0

## 2018-11-17 MED FILL — BD TUBERCULIN SYRINGE 1 ML 27 X 1/2": SUBCUTANEOUS | 84 days supply | Qty: 12 | Fill #3

## 2018-11-17 MED FILL — EMPTY CONTAINER: 120 days supply | Qty: 1 | Fill #0 | Status: AC

## 2018-11-17 MED FILL — TIZANIDINE 4 MG TABLET: 30 days supply | Qty: 90 | Fill #0 | Status: AC

## 2018-11-17 MED FILL — BD TUBERCULIN SYRINGE 1 ML 27 X 1/2": 84 days supply | Qty: 12 | Fill #3 | Status: AC

## 2018-11-17 MED FILL — METHOTREXATE SODIUM 25 MG/ML INJECTION SOLUTION: 28 days supply | Qty: 4 | Fill #0 | Status: AC

## 2018-11-17 NOTE — Unmapped (Signed)
[facility]: Rheumatology Clinic Assessment and Refill Call    Specialty Medication(s): subq methotrexate  Indication(s): PsA     Michele Miller, DOB: [birth year]  Above HIPAA information was verified with patient.      Medications reviewed & verified: Allergies - Medications -      Specialty medication(s) & dose(s) confirmed: yes  Changes to medications: no  Tolerating medications:   Adverse Effects    *All other systems reviewed and are negative        Amount of medications patient has on hand:  0 - dose due today    CLINICAL ASSESSMENT     Michele Miller reports tolerating subq methotrexate well without adverse effects - declined further medication counseling at this time.  Adherence to therapy confirmed with patient and refill record @ [facility].  Patient reports zero missed dose(s) of methotrexate the past few month(s). Patient was last seen by Dr. Delton See in November, 2019 - at that time, was c/o of increased pain but x-rays without active inflammation and no synovitis or psoriasis on exam.  She was referred to the spine center for further evaluation.  Appointment for that is scheduled for 11/29/18.  LFTs normalized at last visit check.  Today, patient also c/o increased pain in neck, back and elbows with some psoriasis in fingers the past few weeks.  Will notify Dr. Delton See and await for visit to spine specialist.  Continue subq methotrexate for now.      Does Michele Miller have follow up appointment scheduled with clinic? Yes, appointment is scheduled and patient is aware    SHIPPING     ?? Shipping address verified with patient and Epic/WAM.  Expected medication delivery date: 11/18/18, via [employer].  Next dose of subq methotrexate from this shipment due on the same day.  ?? Other medications/items to be shipping:  Sharps container, syringes with needles, tizanidine    The patient will receive a print out for each medication shipped and additional FDA Medication Guides as required.  Patient education from [place] or [facility] Leu may also be included in the shipment    All questions were answered and contact information provided for any future questions/concerns.      [name]

## 2018-11-23 NOTE — Unmapped (Deleted)
[facility]    Patient ID: Michele Miller is a 58 y.o. female who presents for follow up of mood  PCP: Soyla Murphy, MD      Informant: {INFORMANT with relatives:23352}    Assessment/Plan:      ***      Preventive services addressed today  {AJM Preventive Services:41504}    {AJM PCMH Review:30708::-- Patient verbalized an understanding of today's assessment and recommendations, as well as the purpose of ongoing medications.}    No follow-ups on file.    Subjective:     No chief complaint on file.      HPI  ***    ROS  As per HPI.    RELEVANT PAST MEDICAL, FAMILY, SURGICAL, SOCIAL HISTORY  - Psoriatic arthritis, psoriasis  - DDD spine (cerivical and lumbar)  - COPD  - Tobacco use  - Depression, Anxiety, trauma history  - Benzodiazapine use disorder  ___________________________________  CURRENT MEDS  Current Outpatient Medications   Medication Sig Dispense Refill   ??? albuterol HFA 90 mcg/actuation inhaler Inhale 2 puffs every six (6) hours as needed for wheezing. 54 g 6   ??? buPROPion (WELLBUTRIN SR) 150 MG 12 hr tablet Take 1 tablet (150 mg total) by mouth Two (2) times a day. 60 tablet 2   ??? cetirizine (ZYRTEC) 10 MG tablet Take 1 tablet (10 mg total) by mouth daily. 90 tablet 3   ??? clobetasol (TEMOVATE) 0.05 % ointment APPLY TOPICALLY TWICE DAILY 30 g PRN   ??? diclofenac sodium (VOLTAREN) 1 % gel Apply 2 grams topically to small joint and 4 grams to large joints as needed up to 4 times daily 300 g PRN   ??? DULoxetine (CYMBALTA) 60 MG capsule Take 2 capsules (120 mg total) by mouth daily. 60 capsule 3   ??? empty container (BD SHARPS COLLECTOR) Misc Use as directed. 1 each 3   ??? ergocalciferol (DRISDOL) 50,000 unit capsule TAKE 1 CAPSULE BY MOUTH ONCE EACH WEEK (Patient not taking: Reported on 11/01/2018) 12 capsule 0   ??? famotidine (PEPCID) 40 MG tablet Take 1 tablet (40 mg total) by mouth every evening. 360 tablet 0   ??? fluticasone propionate (FLONASE) 50 mcg/actuation nasal spray Use 2 sprays in each nostril daily. 16 g 4   ??? folic acid (FOLVITE) 1 MG tablet Take 1 tablet (1 mg total) by mouth daily. 90 tablet 3   ??? gabapentin (NEURONTIN) 300 MG capsule TAKE 1 CAPSULE BY MOUTH 3 TIMES DAILY 270 capsule 0   ??? hydrOXYzine (ATARAX) 25 MG tablet Take 1 tablet (25 mg total) by mouth every eight (8) hours as needed for anxiety. 90 tablet 1   ??? methotrexate 25 mg/mL injection solution Inject 1 mL (25 mg total) under the skin once a week. 4 mL 2   ??? polyethylene glycol (MIRALAX) 17 gram packet Mix the contents of 1 packet (17 g) in 4-8 ounces of water, juice, soda, coffee, or tea and take by mouth daily for 5 days, then use as needed 30 packet 3   ??? syringe with needle 1 mL 27 x 1/2 Syrg USE AS DIRECTED FOR WEEKLY METHOTREXATE INJECTIONS 50 each 0   ??? tiotropium bromide (SPIRIVA RESPIMAT) 1.25 mcg/actuation Mist Inhale 2 puffs (2.5 mcg) BY MOUTH daily. 4 g 5   ??? tiZANidine (ZANAFLEX) 4 MG tablet Take 1 tablet (4 mg total) by mouth every eight (8) hours as needed. 90 tablet 0   ??? traZODone (DESYREL) 50 MG  tablet Take 1 tablet (50 mg total) by mouth nightly. 90 tablet 3     No current facility-administered medications for this visit.        ___________________________________  ALLERGIES  No Known Allergies    {Common ambulatory SmartLinks:30452}    Objective:       Vital Signs  There were no vitals taken for this visit.     Exam  Physical Exam     Data  ***

## 2018-11-24 DIAGNOSIS — Z1211 Encounter for screening for malignant neoplasm of colon: Principal | ICD-10-CM

## 2018-11-24 DIAGNOSIS — K922 Gastrointestinal hemorrhage, unspecified: Principal | ICD-10-CM

## 2018-11-24 MED ORDER — PEG-ELECTROLYTE SOLUTION 420 GRAM ORAL SOLUTION
0 refills | 0 days | Status: CP
Start: 2018-11-24 — End: ?

## 2018-11-24 NOTE — Unmapped (Signed)
bowel prep ordered for colonoscopy
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
## 2018-11-25 NOTE — Unmapped (Signed)
Care Management Progress Note  [facility]    Purpose of contact: To check in on [name] as she awaits referral to STEP clinic (scheduled for 12/14/2018).         Additional Information/Plan: [name] was unavailable, generic message left requesting a return phone call. Will continue attempts to check in on [name].     Tyrell Antonio, MSW, LCSW  Care Manager - [facility]  [phone]

## 2018-11-26 MED FILL — SPIRIVA RESPIMAT 1.25 MCG/ACTUATION SOLUTION FOR INHALATION: RESPIRATORY_TRACT | 30 days supply | Qty: 4 | Fill #1

## 2018-11-26 MED FILL — SPIRIVA RESPIMAT 1.25 MCG/ACTUATION SOLUTION FOR INHALATION: 30 days supply | Qty: 4 | Fill #1 | Status: AC

## 2018-11-26 MED FILL — CYMBALTA 60 MG CAPSULE,DELAYED RELEASE: 30 days supply | Qty: 60 | Fill #1 | Status: AC

## 2018-11-26 MED FILL — FLUTICASONE PROPIONATE 50 MCG/ACTUATION NASAL SPRAY,SUSPENSION: NASAL | 30 days supply | Qty: 16 | Fill #1

## 2018-11-26 MED FILL — CYMBALTA 60 MG CAPSULE,DELAYED RELEASE: ORAL | 30 days supply | Qty: 60 | Fill #1

## 2018-11-26 MED FILL — FLUTICASONE PROPIONATE 50 MCG/ACTUATION NASAL SPRAY,SUSPENSION: 30 days supply | Qty: 16 | Fill #1 | Status: AC

## 2018-12-03 NOTE — Unmapped (Signed)
Patient recorded having enough medication for a month. Rescheduling refill call for 04/10. - BDC

## 2018-12-14 NOTE — Unmapped (Deleted)
[facility]  Psychiatry Telehealth Encounter  New Patient Evaluation - Outpatient    Name: Michele Miller  Date: 12/14/2018  MRN: [id number]  DOB: 12-15-1960  PCP: Soyla Murphy, MD    Encounter Description: This encounter was conducted from [facility] psychiatry outpatient clinic via telephone in the setting of State of Emergency due to COVID-19 Pandemic. Michele Miller was located at [address]  [address]  [place] [address]. See Plan for telemedicine consent/disclaimer.    Assessment:   Michele Miller is a 58 y.o., White or Caucasian race, Not Hispanic or Latino ethnicity,  ENGLISH speaking female  with a history of ***, who presents for initial evaluation by telephone ***.      ***    Risk Assessment:  A suicide and violence risk assessment was performed as part of this evaluation. There patient is deemed to be at chronic elevated risk for self-harm/suicide given the following factors: {PSY Suicide Risk Factors:22910}. The patient is deemed to be at chronic elevated risk for violence given the following factors: {PSY Violence Risk Factors:22914}. These risk factors are mitigated by the following factors:{PSY Mitigating Factors:22917}. There is no acute risk for suicide or violence at this time. The patient was educated about relevant modifiable risk factors including following recommendations for treatment of psychiatric illness and abstaining from substance abuse.   While future psychiatric events cannot be accurately predicted, the patient does not currently require  acute inpatient psychiatric care and does not currently meet [facility] involuntary commitment criteria.      Diagnoses:   Patient Active Problem List   Diagnosis   ??? Depressive disorder   ??? Psoriasis   ??? Tobacco use disorder   ??? Carpal tunnel syndrome   ??? Anxiety   ??? Acute stress reaction   ??? Grief at loss of child   ??? Trigger thumb of right hand   ??? Benzodiazepine misuse   ??? COPD (chronic obstructive pulmonary disease) (CMS-HCC)   ??? Chronic pain   ??? Osteoarthritis cervical spine   ??? GI bleed   ??? Epicondylitis, lateral   ??? Trochanteric bursitis of right hip   ??? Chronic idiopathic constipation   ??? PTSD (post-traumatic stress disorder)       Stressors: ***     Plan:  ***    {[place] [id number] helpine is [phone]. This will disappear if not selected.:66707::[place] Covid19 helpline # is [phone]. }    Psychotherapy:  {psychotherapy add-on documentation:53071}    Time Spent: {BMWU:13244::*** minutes}    {You must select the following telehealth disclaimer:67188}    {Select attestation if you are a resident, otherwise this will disappear or you may WNUUVO:53664}    Nichola Sizer, MD       Subjective:    Psychiatric Chief Concern:  {Chief Complaint:210228460}    HPI: Patient is a 58 y.o., White or Caucasian race, Not Hispanic or Latino ethnicity,  ENGLISH speaking female  with a history of ***. Patient interviewed in a private place, accompanied by ***.    ***    Allergies:  {GSC Allergies:30421601}    Medications:   {Sur Gen Med list:3371781900}    Psychiatric/Medical History:  {GSC Past Medical History:30421616}    Surgical History:  {GSC Past Surgical History:30421619}    Social History:  Social History     Socioeconomic History   ??? Marital status: Single     Spouse name: Not on file   ??? Number of children: Not on  file   ??? Years of education: Not on file   ??? Highest education level: Not on file   Occupational History   ??? Occupation: Glass blower/designer   Social Needs   ??? Financial resource strain: Not on file   ??? Food insecurity     Worry: Not on file     Inability: Not on file   ??? Transportation needs     Medical: Not on file     Non-medical: Not on file   Tobacco Use   ??? Smoking status: Current Every Day Smoker     Packs/day: 1.50     Years: 20.00     Pack years: 30.00     Types: Cigarettes     Start date: 01/02/1974   ??? Smokeless tobacco: Never Used   Substance and Sexual Activity   ??? Alcohol use: No     Alcohol/week: 0.0 standard drinks   ??? Drug use: No   ??? Sexual activity: Not on file   Lifestyle   ??? Physical activity     Days per week: Not on file     Minutes per session: Not on file   ??? Stress: Not on file   Relationships   ??? Social Wellsite geologist on phone: Not on file     Gets together: Not on file     Attends religious service: Not on file     Active member of club or organization: Not on file     Attends meetings of clubs or organizations: Not on file     Relationship status: Not on file   Other Topics Concern   ??? Exercise No   ??? Living Situation No   Social History Narrative   ??? Not on file       Family History:  {GSC Family History:30421602}    ROS:   As per Interval History and:  Constitutional:  {PSY - general ROS:53235}  Neuro:  {PSY - neuro ROS:53236}    Objective:     Mental Status Exam:  Speech/Language:    {PSY Speech/Lang:23011}   Mood:   {PSY Mood:23012}   Thought process and Associations:   {PSY Thought Process revised:53234}   Abnormal/psychotic thought content:     {PSY Thought Content:23016}   Perceptual disturbances:     {perceptual dist phone:66751::Does not endorse auditory or visual hallucinations}     Orientation:   {PSY Orientation:23018}   Insight:     {PSY Insight:23023}   Judgment:    {PSY Judgment:23024}   Impulse Control:   {PSY Impulse Control:23555}     Test Results:  Data Review: {PSY Labs:23633}  Imaging: {PSY Imaging findings:23611}    Psychometrics:

## 2018-12-20 NOTE — Unmapped (Signed)
[name] called to offer phone or video visit for upcoming appt with Michele Pavlov, PA due to COVID-19 restrictions. No answer, unable to leave message.     Brennan Bailey, FNP

## 2018-12-21 NOTE — Unmapped (Signed)
[name] called to offer phone or video visit for upcoming appt with Carlus Pavlov, PA due to COVID-19 restrictions. Second attempt to reach patient. No answer, unable to leave message.   ??  Brennan Bailey, FNP

## 2018-12-23 NOTE — Unmapped (Signed)
[facility] Refill Coordination Note    Specialty Medication(s) to be Shipped:   Inflammatory Disorders: methotrexate (injectable)    Other medication(s) to be shipped: bupropion, cymbalta, flonase     [name], DOB: Oct 15, 1960  Phone: [phone] (home)       All above HIPAA information was verified with patient.     Completed refill call assessment today to schedule patient's medication shipment from the [facility] [phone]).       Specialty medication(s) and dose(s) confirmed: Regimen is correct and unchanged.   Changes to medications: [name] reports no changes reported at this time.  Changes to insurance: No  Questions for the pharmacist: No    Confirmed patient received Welcome Packet with first shipment. The patient will receive a drug information handout for each medication shipped and additional FDA Medication Guides as required.       DISEASE/MEDICATION-SPECIFIC INFORMATION        For patients on injectable medications: Patient currently has 1 doses left.  Next injection is scheduled for 12/24/18.    SPECIALTY MEDICATION ADHERENCE     Medication Adherence    Patient reported X missed doses in the last month:  0  Specialty Medication:  methotrexate  Patient is on additional specialty medications:  No  Patient is on more than two specialty medications:  No  Any gaps in refill history greater than 2 weeks in the last 3 months:  no  Demonstrates understanding of importance of adherence:  yes  Informant:  patient                Methotrexate 25mg /ml:  Patient has 7 days of medication on hand      SHIPPING     Shipping address confirmed in Epic.     Delivery Scheduled: Yes, Expected medication delivery date: 12/29/18.     Medication will be delivered via UPS to the home address in Epic WAM.    [name]   [facility] Specialty Technician

## 2018-12-27 MED FILL — CYMBALTA 60 MG CAPSULE,DELAYED RELEASE: 30 days supply | Qty: 60 | Fill #2 | Status: AC

## 2018-12-27 MED FILL — METHOTREXATE SODIUM 25 MG/ML INJECTION SOLUTION: SUBCUTANEOUS | 28 days supply | Qty: 4 | Fill #1

## 2018-12-27 MED FILL — BUPROPION HCL SR 150 MG TABLET,12 HR SUSTAINED-RELEASE: ORAL | 30 days supply | Qty: 60 | Fill #1

## 2018-12-27 MED FILL — FLUTICASONE PROPIONATE 50 MCG/ACTUATION NASAL SPRAY,SUSPENSION: 30 days supply | Qty: 16 | Fill #2 | Status: AC

## 2018-12-27 MED FILL — CYMBALTA 60 MG CAPSULE,DELAYED RELEASE: ORAL | 30 days supply | Qty: 60 | Fill #2

## 2018-12-27 MED FILL — METHOTREXATE SODIUM 25 MG/ML INJECTION SOLUTION: 28 days supply | Qty: 4 | Fill #1 | Status: AC

## 2018-12-27 MED FILL — BUPROPION HCL SR 150 MG TABLET,12 HR SUSTAINED-RELEASE: 30 days supply | Qty: 60 | Fill #1 | Status: AC

## 2018-12-27 MED FILL — FLUTICASONE PROPIONATE 50 MCG/ACTUATION NASAL SPRAY,SUSPENSION: NASAL | 30 days supply | Qty: 16 | Fill #2

## 2018-12-29 NOTE — Unmapped (Deleted)
Phone Visit Note  This medical encounter was conducted virtually using [email or web address]  TeleHealth protocols.      I have identified myself to the patient and conveyed my credentials to [name]  In case we get disconnected, patient's phone number is [phone] (home)   In case of Emergency, patient's current location: { :[address]}  Is there someone else in the room? { :66677}    [name] is a 58 y.o. female requesting a telephone visit today regarding the following issues:    Assessment/Plan:      Problem List Items Addressed This Visit     None          Followup:  No follow-ups on file.    I spent *** minutes on the {phone audio video visit:67489} with the patient. I spent an additional *** minutes on pre- and post-visit activities.     The patient was physically located in [place] or a state in which I am permitted to provide care. The patient understood that s/he may incur co-pays and cost sharing, and agreed to the telemedicine visit. The visit was completed via phone and/or video, which was appropriate and reasonable under the circumstances given the patient's presentation at the time.    The patient has been advised of the potential risks and limitations of this mode of treatment (including, but not limited to, the absence of in-person examination) and has agreed to be treated using telemedicine. The patient's/patient's family's questions regarding telemedicine have been answered.     If the phone/video visit was completed in an ambulatory setting, the patient has also been advised to contact their provider???s office for worsening conditions, and seek emergency medical treatment and/or call 911 if the patient deems either necessary.       Coding: {For Use During Covid-19 Pandemic UJWJ:19147}      Subjective:     HPI  [name] is a 58 y.o. female requesting a telephone visit to discuss the following issues:    ***    ROS  As per HPI.      HISTORY:  I have reviewed the patient's problem list, current medications, and allergies and have updated/reconciled them as needed.      Objective:     As part of this Telephone Visit, no in-person exam was conducted.    [facility]  [place] at [facility]  [address] [place], [place] [address] ??? Telephone [phone] ??? Fax [phone]  [email or web address]

## 2018-12-29 NOTE — Unmapped (Signed)
Patient Advice Request - Ancillary Staff [facility] Name and relation (if other than patient): Self  Question/Concern for provider (Specific): [name] explained she has a phone appt today at 10 am with [facility] but she never received the phone call from [facility]. She is requesting for her provider to call her.   Best Contact Time: Anytime   Other info: None.

## 2018-12-29 NOTE — Unmapped (Signed)
Done , she said she was having problems with her phone     Michele Miller

## 2018-12-29 NOTE — Unmapped (Signed)
TC to pt x2 for our phone visit.  No answer.  Left VM to let her know I will try again later.    [name] Theone Bowell

## 2018-12-29 NOTE — Unmapped (Signed)
As you can see from my phone note, I did call, twice, and left a voicemail.  Please call to see if she would be free for a phone visit tomorrow morning around 9am and move her visit to then.  Please also confirm her phone number is correct.    Michele Miller

## 2018-12-30 ENCOUNTER — Encounter: Admit: 2018-12-30 | Discharge: 2018-12-31 | Payer: MEDICARE

## 2018-12-30 DIAGNOSIS — F5104 Psychophysiologic insomnia: Secondary | ICD-10-CM

## 2018-12-30 DIAGNOSIS — F332 Major depressive disorder, recurrent severe without psychotic features: Secondary | ICD-10-CM

## 2018-12-30 DIAGNOSIS — F4310 Post-traumatic stress disorder, unspecified: Principal | ICD-10-CM

## 2018-12-30 MED ORDER — TRAZODONE 50 MG TABLET
ORAL_TABLET | Freq: Every evening | ORAL | 3 refills | 90.00000 days | Status: CP
Start: 2018-12-30 — End: ?
  Filled 2019-01-05: qty 180, 90d supply, fill #0

## 2018-12-30 MED ORDER — CLOBETASOL 0.05 % TOPICAL OINTMENT
Freq: Two times a day (BID) | TOPICAL | 0 refills | 0 days | Status: CP | PRN
Start: 2018-12-30 — End: 2019-01-05

## 2018-12-30 NOTE — Unmapped (Signed)
Phone Visit Note  This medical encounter was conducted virtually using [email or web address]  TeleHealth protocols.      I have identified myself to the patient and conveyed my credentials to [name]  In case we get disconnected, patient's phone number is [phone] (home)   In case of Emergency, patient's current location: Home: [address]  [place] [address]  Is there someone else in the room? No.     [name] is a 58 y.o. female requesting a telephone visit today regarding the following issues:    Assessment/Plan:      1. PTSD (post-traumatic stress disorder)  2. Severe episode of recurrent major depressive disorder, without psychotic features (CMS-HCC)  3. Psychophysiologic insomnia  Has had some benefit from trazodone but would like to use slightly higher dose. Has done well with 100mg  in the past. Will increase to 100mg , though she wishes to use lowest dose she can, so would like 50mg  pills so she can start with just one.  Discussed plans for psychiatric care, will liaise with psych team to make sure they are able to reach her. I think her phone doesn't accept calls from blocked numbers, but I was able to get through using J[employer].  Discussed risks of combining sedating medications, she is aware and cautious.   - traZODone (DESYREL) 50 MG tablet; Take 2 tablets (100 mg total) by mouth nightly.  Dispense: 180 tablet; Refill: 3    Followup:  Return in about 3 months (around 03/31/2019) for Mood check in.    I spent 5 minutes on the phone with the patient. I spent an additional 4 minutes on pre- and post-visit activities.     The patient was physically located in [place] or a state in which I am permitted to provide care. The patient understood that s/he may incur co-pays and cost sharing, and agreed to the telemedicine visit. The visit was completed via phone and/or video, which was appropriate and reasonable under the circumstances given the patient's presentation at the time. The patient has been advised of the potential risks and limitations of this mode of treatment (including, but not limited to, the absence of in-person examination) and has agreed to be treated using telemedicine. The patient's/patient's family's questions regarding telemedicine have been answered.     If the phone/video visit was completed in an ambulatory setting, the patient has also been advised to contact their provider???s office for worsening conditions, and seek emergency medical treatment and/or call 911 if the patient deems either necessary.       Coding: Virtual Check In (a 5-10 min quick conversation initiated by patient): Medicare/Medicaid/BCBS/State Health/Aetna/Cigna/Humana/United/UMR: In charge capture section Use  G2012 and use no LOS for E/M      Subjective:     HPI  [name] is a 58 y.o. female requesting a telephone visit to discuss the following issues:    Calling in because she continues to have trouble sleeping.  Having some nerve pain in her back, wasn't able to see the spine clinic in March.  Wondering if she can get a higher dose of trazodone, 100mg  has worked well for her, would like to try this.  Had a phone call with psychiatry, thinks she was supposed to get a call back, but hasn't.  Worried that her phone isn't working correctly and she may be missing calls.      ROS  As per HPI.      HISTORY:  I have  reviewed the patient's problem list, current medications, and allergies and have updated/reconciled them as needed.      Objective:     As part of this Telephone Visit, no in-person exam was conducted.    [facility]  [place] of [place] at [facility]  [address] [place], [place] [address] ??? Telephone [phone] ??? Fax [phone]  [email or web address]

## 2018-12-30 NOTE — Unmapped (Signed)
Patient Education        Earwax Blockage: Care Instructions  Your Care Instructions    Earwax is a natural substance that protects the ear canal. Normally, earwax drains from the ears and does not cause problems. Sometimes earwax builds up and hardens. Earwax blockage (also called cerumen impaction) can cause some loss of hearing and pain. When wax is tightly packed, you will need to have your doctor remove it.  Follow-up care is a key part of your treatment and safety. Be sure to make and go to all appointments, and call your doctor if you are having problems. It's also a good idea to know your test results and keep a list of the medicines you take.  How can you care for yourself at home?  ?? Do not try to remove earwax with cotton swabs, fingers, or other objects. This can make the blockage worse and damage the eardrum.  ?? If your doctor recommends that you try to remove earwax at home:  ? Soften and loosen the earwax with warm mineral oil. You also can try hydrogen peroxide mixed with an equal amount of room temperature water. Place 2 drops of the fluid, warmed to body temperature, in the ear two times a day for up to 5 days.  ? Once the wax is loose and soft, all that is usually needed to remove it from the ear canal is a gentle, warm shower. Direct the water into the ear, then tip your head to let the earwax drain out. Dry your ear thoroughly with a hair dryer set on low. Hold the dryer several inches from your ear.  ? If the warm mineral oil and shower do not work, use an over-the-counter wax softener. Read and follow all instructions on the label. After using the wax softener, use an ear syringe to gently flush the ear. Make sure the flushing solution is body temperature. Cool or hot fluids in the ear can cause dizziness.  When should you call for help?  Call your doctor now or seek immediate medical care if:  ?? ?? Pus or blood drains from your ear.   ?? ?? Your ears are ringing or feel full.   ?? ?? You have a loss of hearing.   ??Watch closely for changes in your health, and be sure to contact your doctor if:  ?? ?? You have pain or reduced hearing after 1 week of home treatment.   ?? ?? You have any new symptoms, such as nausea or balance problems.   Where can you learn more?  Go to [facility] at [email or web address]  [facility] under [employer]. Enter [phone] in the search box to learn more about Earwax Blockage: Care Instructions.  Current as of: June 26, 2019Content Version: 12.4  ?? 2006-2020 [employer], Inc[employer].  Care instructions adapted under license by [facility]. If you have questions about a medical condition or this instruction, always ask your healthcare professional. Health[employer], Incorporated disclaims any warranty or liability for your use of this information.

## 2018-12-30 NOTE — Unmapped (Signed)
[facility] Population Health   Care Management Progress Note             Date of Service:  12/30/2018      Service:  Care Management - phone    Purpose of contact:         Discussed the following with Michele Miller:    Depression and PTSD  CCM Barriers to Care: N/A    Provider/Care Partner(s)  to follow up on:   N/A    Left voicemail requesting return call, re-introduction of self and role.   Health Maintenance Due   Topic Date Due   ??? DTaP/Tdap/Td Vaccines (1 - Tdap) 02/02/1973   ??? Zoster Vaccines (1 of 2) 02/03/2012   ??? Mammogram Start Age 39  06/10/2017   ??? Colonoscopy  09/23/2017       Additional Information/Plan:  Patient provided my direct contact information and encouraged to contact me should additional needs arise.    Minutes spent providing outreach: 03    Tyrell Antonio  Population Health  [facility]   [phone]

## 2018-12-30 NOTE — Unmapped (Signed)
I spent 10 minutes on the phone with the patient. I spent an additional 12 minutes on pre- and post-visit activities.     The patient was physically located in [place] or a state in which I am permitted to provide care. The patient understood that s/he may incur co-pays and cost sharing, and agreed to the telemedicine visit. The visit was completed via phone and/or video, which was appropriate and reasonable under the circumstances given the patient's presentation at the time.    The patient has been advised of the potential risks and limitations of this mode of treatment (including, but not limited to, the absence of in-person examination) and has agreed to be treated using telemedicine. The patient's/patient's family's questions regarding telemedicine have been answered.     If the phone/video visit was completed in an ambulatory setting, the patient has also been advised to contact their provider???s office for worsening conditions, and seek emergency medical treatment and/or call 911 if the patient deems either necessary.         REASON FOR VISIT: f/u psoriatic arthritis.     Identification: Pt self identified using name and date of birth  Patient location: [place], [place]  The limitations of this telemedicine encounter were discussed with patient. Both the patient and myself agreed to this encounter despite these limitations. Benefits of this telemedicine encounter included allowing for continued care of patient and minimizing risk of exposure to COVID-19.     HISTORY: [name] is a 58 y.o. female with hx of psoriatic arthritis. She also has clinical findings suggestive of centralized chronic pain. Was following pain management but has lost f/u due to transportation issues.    Psoriatic arthritis treated with mtx SQ monotherapy. Also on pharmacotherapy for fibromyalgia from PCP.   She has multiple psychiatric comorbidities including PTSD and anxiety. These have been exacerbated by her son's untimely death of her son in 14.     Interim history:   Pt presents via phone call for follow up.     She reports that her psoriatic arthritis has gotten bad in the interim.  She notes worsening swelling in the hands.  Her main concern today is of severe worsening of pain in the back.  She reports that her back pain is intolerable, and often drives her to nausea.  Back pain has been progressively getting worse over the last several months.  No interim injuries.  She has had chronic back pain for many years, but feels it is getting worse now.  X-rays done at prior appointment revealed degenerative changes.  She has been referred to the spine center for further evaluation and has an appointment with them in June.  She asks what she can take for her back pain until that time.  She is currently taking tizanidine, gabapentin, duloxetine for pain.  Reports that her pain is uncontrolled despite these medications.    She reports worsening skin psoriasis in between fingers.  She has used clobetasol in the past for this and would like a prescription for clobetasol again.  She also notes swelling at the base of the fingers and not on the distal pinky finger that is very tender.    No fevers, chills, weight loss.      CURRENT MEDICATIONS:  Current Outpatient Medications   Medication Sig Dispense Refill   ??? albuterol HFA 90 mcg/actuation inhaler Inhale 2 puffs every six (6) hours as needed for wheezing. 54 g 6   ??? buPROPion (WELLBUTRIN SR) 150 MG  12 hr tablet Take 1 tablet (150 mg total) by mouth Two (2) times a day. 60 tablet 2   ??? cetirizine (ZYRTEC) 10 MG tablet Take 1 tablet (10 mg total) by mouth daily. 90 tablet 3   ??? clobetasol (TEMOVATE) 0.05 % ointment APPLY TOPICALLY TWICE DAILY 30 g PRN   ??? diclofenac sodium (VOLTAREN) 1 % gel Apply 2 grams topically to small joint and 4 grams to large joints as needed up to 4 times daily 300 g PRN   ??? DULoxetine (CYMBALTA) 60 MG capsule Take 2 capsules (120 mg total) by mouth daily. 60 capsule 3   ??? empty container (BD SHARPS COLLECTOR) Misc Use as directed. 1 each 3   ??? famotidine (PEPCID) 40 MG tablet Take 1 tablet (40 mg total) by mouth every evening. 360 tablet 0   ??? fluticasone propionate (FLONASE) 50 mcg/actuation nasal spray Use 2 sprays in each nostril daily. 16 g 4   ??? folic acid (FOLVITE) 1 MG tablet Take 1 tablet (1 mg total) by mouth daily. 90 tablet 3   ??? gabapentin (NEURONTIN) 300 MG capsule TAKE 1 CAPSULE BY MOUTH 3 TIMES DAILY 270 capsule 0   ??? methotrexate 25 mg/mL injection solution Inject 1 mL (25 mg total) under the skin once a week. 4 mL 2   ??? peg-electrolyte soln (GOLYTELY) 420 gram SolR Take as directed 1 Bottle 0   ??? polyethylene glycol (MIRALAX) 17 gram packet Mix the contents of 1 packet (17 g) in 4-8 ounces of water, juice, soda, coffee, or tea and take by mouth daily for 5 days, then use as needed 30 packet 3   ??? syringe with needle 1 mL 27 x 1/2 Syrg USE AS DIRECTED FOR WEEKLY METHOTREXATE INJECTIONS 50 each 0   ??? tiotropium bromide (SPIRIVA RESPIMAT) 1.25 mcg/actuation Mist Inhale 2 puffs (2.5 mcg) BY MOUTH daily. 4 g 5   ??? tiZANidine (ZANAFLEX) 4 MG tablet Take 1 tablet (4 mg total) by mouth every eight (8) hours as needed. 90 tablet 0   ??? traZODone (DESYREL) 50 MG tablet Take 2 tablets (100 mg total) by mouth nightly. 180 tablet 3     No current facility-administered medications for this visit.        Past Medical History:   Diagnosis Date   ??? Arthritis    ??? Baker's cyst    ??? COPD (chronic obstructive pulmonary disease) (CMS-HCC)    ??? CTS (carpal tunnel syndrome)    ??? Depression    ??? Headache(784.0)    ??? Joint pain    ??? Psoriasis    ??? PTSD (post-traumatic stress disorder)    ??? Scoliosis         Record Review: Available records were reviewed, including pertinent office visits, labs, and imaging.      REVIEW OF SYSTEMS: Ten system were reviewed and negative except as noted above.    PHYSICAL EXAM:  Patient reported vitals:  Vitals:    12/30/18 1143   Weight: 52.2 kg (115 lb)      General:   Does not sound to be in distress   Lungs:  No wheezing, coughing, or increased respiratory effort noted   Psych:  Appropriate interaction       ASSESSMENT/PLAN:  1. Psoriatic arthritis (CMS-HCC)  Continue methotrexate 25 mg subcu weekly, folic acid 1 mg daily.  May use clobetasol ointment on psoriatic rashes twice daily as needed.  - clobetasoL (TEMOVATE) 0.05 % ointment; Apply topically two (2) times  a day as needed.  Dispense: 30 g; Refill: 0    2. Methotrexate, long term, current use  Checking labs below to evaluate for medication toxicity.    - Albumin; Future  - AST; Future  - ALT; Future  - CBC w/ Differential; Future  - Creatinine; Future    3. Osteoarthritis  Suspect that the tender knot in the distal pinky is a [place] node from osteoarthritis, though cannot confirm this over the phone.  Continue Voltaren gel over affected joints when needed.    4.  DJD L-spine  Her evaluation for etiology of her low back pain is the most consistent with DJD rather than active psoriatic arthritis.  We again discussed this, and discussed that I would not recommend escalating immunosuppressive therapy for her low back pain.  She is already taking multiple different medications for back pain (gabapentin, tizanidine, Cymbalta).  Would not increase pharmacotherapy at this time, but information on stretches for low back pain were sent to her MyChart account to perform before her visit with the spine center.    HCM:   - PCV13 Status: Did not discuss today  - PPSV 23 Status: 11/03/2013  - TdaP (Booster once every 10 years). Status:  - Annual Influenza vaccine. Status: 10/25/2018  - Bone health: not on prednisone   - Contraception: Status post hysterectomy    RTC 2 mo as scheduled with Dr Delton See

## 2019-01-05 MED ORDER — CLOBETASOL 0.05 % TOPICAL CREAM
Freq: Two times a day (BID) | TOPICAL | 0 refills | 0 days | Status: CP
Start: 2019-01-05 — End: 2019-04-18
  Filled 2019-01-06: qty 30, 15d supply, fill #0

## 2019-01-05 MED FILL — SPIRIVA RESPIMAT 1.25 MCG/ACTUATION SOLUTION FOR INHALATION: RESPIRATORY_TRACT | 30 days supply | Qty: 4 | Fill #2

## 2019-01-05 MED FILL — SPIRIVA RESPIMAT 1.25 MCG/ACTUATION SOLUTION FOR INHALATION: 30 days supply | Qty: 4 | Fill #2 | Status: AC

## 2019-01-05 MED FILL — TRAZODONE 50 MG TABLET: 90 days supply | Qty: 180 | Fill #0 | Status: AC

## 2019-01-05 NOTE — Unmapped (Signed)
New rx sent per formulary request.

## 2019-01-05 NOTE — Unmapped (Signed)
-----   Message from Yolonda Kida sent at 01/05/2019  9:51 AM EDT -----  Regarding: Non-Formulary Drug  Hello,    We received a prescription, clobetasoL 0.05 % ointment (TEMOVATE),  at the [facility] that is not covered on the Pharmacy Assistance Program Formulary.  Please refer to the link below for the formulary and send [place] a new prescription at your earliest convenience.     [email or web address]    ##clobetasoL 0.05 % CREAM (TEMOVATE) is covered.    Thank you,    Yolonda Kida

## 2019-01-06 MED FILL — CLOBETASOL 0.05 % TOPICAL CREAM: 15 days supply | Qty: 30 | Fill #0 | Status: AC

## 2019-01-11 ENCOUNTER — Encounter: Admit: 2019-01-11 | Discharge: 2019-01-12 | Payer: MEDICARE

## 2019-01-11 DIAGNOSIS — K589 Irritable bowel syndrome without diarrhea: Principal | ICD-10-CM

## 2019-01-11 MED ORDER — GABAPENTIN 300 MG CAPSULE
ORAL_CAPSULE | ORAL | 0 refills | 0 days | Status: CP
Start: 2019-01-11 — End: 2019-05-05
  Filled 2019-01-17: qty 270, 90d supply, fill #0

## 2019-01-11 MED ORDER — DICYCLOMINE 10 MG CAPSULE
ORAL_CAPSULE | Freq: Four times a day (QID) | ORAL | 1 refills | 0 days | Status: CP | PRN
Start: 2019-01-11 — End: ?
  Filled 2019-01-11: qty 240, 30d supply, fill #0

## 2019-01-11 MED FILL — CETIRIZINE 10 MG TABLET: 90 days supply | Qty: 90 | Fill #1 | Status: AC

## 2019-01-11 MED FILL — DICYCLOMINE 10 MG CAPSULE: 30 days supply | Qty: 240 | Fill #0 | Status: AC

## 2019-01-11 MED FILL — CETIRIZINE 10 MG TABLET: ORAL | 90 days supply | Qty: 90 | Fill #1

## 2019-01-11 NOTE — Unmapped (Signed)
She is schedule to see Dr. Gwenlyn Fudge on tomorrow afternoon      Riley Lam

## 2019-01-11 NOTE — Unmapped (Signed)
Patient Education        Learning About the Low FODMAP Diet for Irritable Bowel Syndrome (IBS)  What is the low-FODMAP diet?  A low-FODMAP diet is a way to find out what foods give you digestion problems. You stop eating certain high-FODMAP foods for about 2 months. Then you add them back to see how your body reacts.  This is called a challenge diet. A dietitian or doctor can help you follow this diet.  FODMAPs are carbohydrates. They are in many types of foods. FODMAP stands for:  ?? F ermentable.  ?? O ligosaccharides.  ?? D isaccharides.  ?? M onosaccharides.  ?? A nd p olyols.  If you have digestive problems, some of these foods can make your symptoms worse. When you are on this diet, you can still eat certain fruits and vegetables. You can also eat certain grains, meats, fish, and lactose-free milks.  What is it used for?  If you have irritable bowel syndrome (IBS), you can ease your symptoms by not eating some types of foods. Some people also use this diet for inflammatory bowel disease (IBD) or some food intolerances.  High-FODMAP foods can be hard to digest. They pull more fluid into your intestines. They are also easily fermented. This can lead to bloating, belly pain, gas, and diarrhea.  The low-FODMAP diet can help you figure out what foods to avoid. And it can help you find foods that are easier to digest.  This diet can help with symptoms of some digestive diseases. But it's not a cure. You will still need to manage your condition.  How does it work?  You will work with a doctor or dietitian when you start the diet.  At first, you won't eat any high-FODMAP foods for a few weeks.  Go to [email or web address] to learn more about this diet. You'll also find links to an app for your phone or other device. You'll find low-FODMAP cookbooks there too.  After 6 to 8 weeks, you will start to try high-FODMAP foods again. You will add those foods back to your diet, one group at a time. Your doctor or dietitian will probably have you wait a few days before you add each new group of those foods.  Keep a food diary. You can write down the foods you try and note how they make you feel.  After a few weeks, you may have a better idea of what foods you should avoid and what foods make you feel your best.  What are the risks?  There is some risk of not getting all of the vitamins and nutrients you need on the low-FODMAP diet. These include:  ?? Folate.  ?? Thiamin.  ?? Vitamin B6.  ?? Calcium.  ?? Vitamin D.  Your dietitian or doctor can help you find other sources of these if needed.  This diet may limit your fiber intake. Try to plan your meals to include other sources of fiber.  What foods are on the low-FODMAP diet?  Here is a guide to foods that you can eat, plus the foods that you should avoid, when you are on the low-FODMAP diet.  Grains  Okay to eat: Foods made from grains like arrowroot, buckwheat, corn, millet, and oats. You can also eat potato, quinoa, rice, sorghum, tapioca, and teff. Cereals, pasta, breads, corn tortillas and baked goods made from these grains are also okay. (These grains may be labeled gluten-free.)  Avoid: Grains like wheat, barley, and rye. Avoid  ingredients such as bulgur, couscous, durum, and semolina. And avoid cereals, breads, and pastas made from these grains. Avoid chickpea, lentil, and pea flour.  Proteins  Okay to eat: Most meat, fish, and eggs without high-FODMAP sauces. You can have small amounts of almonds or hazelnuts (10 nuts). Macadamia nuts, peanuts, pecans, pine nuts, and walnuts are also okay. You can also eat chia and pumpkin seeds, tofu, and tempeh.  Avoid: Beans, chickpeas, lentils, and soybeans. Avoid pistachio and cashew nuts. And some sausages may have high-FODMAP ingredients.  Dairy  Okay to eat: Lactose-free dairy milks. Rice milk and almond milk are okay. So are lactose-free yogurts, kefirs, ice creams, and sorbet from low-FODMAP fruits and sweeteners. (These are often labeled lactose-free.) You can have small amounts (2 Tbsp) of cottage, cream, or ricotta cheese. Hard cheeses like cheddar, [place], [place], and Swiss are okay. So are small amounts (1 oz) of aged or ripened cheeses like Brie, blue, and feta.  Avoid: Milk, including cow, goat, and sheep. Avoid condensed or evaporated milk, buttermilk, custard, cream, sour cream, yogurt, and ice cream. Avoid soy milk. (Check sauces for dairy ingredients.)  Vegetables  Okay to eat: Bamboo shoots, bell peppers, bok choy, up to ?? cup of broccoli or cabbage (red or white), and cucumbers. Eggplant, green beans, lettuce, olives, parsnips, and potatoes are okay to eat. So are pumpkin, rutabaga, seaweed, sprouts, Swiss chard, and spinach. You can eat scallions (green part only) and squash (not butternut). You can eat tomatoes, turnips, watercress, yams, and zucchini. You can also have small amounts of artichoke hearts (from can, 1 oz), carrots, corn (?? cob), and sweet potato (?? cup).  Avoid: Artichokes, asparagus, Brussels sprouts, savoy cabbage, cauliflower, and celery. And avoid garlic, leeks, mushrooms, okra, onions, scallions (white part), shallots, and peas.  Fruits  Okay to eat: Bananas, blueberries, cantaloupe, coconut, grapes, and honeydew. Kiwi, lemons, limes, oranges, passion fruit, papaya, and pineapple are also okay. You can eat plantain, raspberries, rhubarb, star fruit, strawberries, tangelo, and tangerine. You can also have small amounts of dried banana chips (up to 10 chips), dried cranberries (1 Tbsp), and shredded coconut (up to ?? cup).  Avoid: Apples, applesauce, apricots, avocados, blackberries, boysenberries, and cherries. Also avoid dates, figs, grapefruit, guava, lychee, and mangoes. Don't eat nectarines, peaches, pears, persimmon, plums, prunes, tam[place], or watermelon. And limit most canned and dried fruits.  Oils, spices, condiments, and sweeteners  Okay to eat: Vegetable oils (including garlic infused), butter, ghee, lard, and margarine (no trans fat). You can have most fresh herbs like basil, chives, coriander, ginger, parsley, rosemary and thyme. You can have salt, jams made from low-FODMAP fruits, mayonnaise, and mustard. Soy sauce, hot sauce (no garlic), tamari, and vinegar are also okay. Sweeteners that are okay include sugar (sucrose), powdered (confectioner's) sugar, brown sugar, glucose, and maple syrup. You can also have some artificial sweeteners like aspartame, saccharine, and stevia.  Avoid: Chutneys, hummus, jellies, garlic sauces, and gravies made with onion or garlic. Avoid pickles, relish, some salad dressings and soup stocks, salsa, and tomato paste. And avoid sauces and other foods with high fructose corn syrup, honey, molasses, and agave. Avoid artificial sweeteners (isomalt, mannitol, malitol, sorbitol, and xylitol). Avoid corn syrup solids, fructose, fruit juice concentrate, and polydextrose.  Other foods and drinks  Okay to have: Water, soda water, tonic, soft drinks sweetened with sugar, ?? cup of low-FODMAP fruit juice, and most teas and alcohols. You can also eat foods made with baking powder and soda, cocoa, and gelatin.  Avoid: Juices from high-FODMAP fruits and vegetables. And avoid fortified wines, chamomile and fennel teas, chicory-based drinks and coffee substitutes, and bouillon cubes.  Follow-up care is a key part of your treatment and safety. Be sure to make and go to all appointments, and call your doctor if you are having problems. It's also a good idea to know your test results and keep a list of the medicines you take.  Where can you learn more?  Go to [facility] at https://[email or web address]  [employer] [facility] [employer]. Enter L235 in the search box to learn more about Learning About the Low FODMAP Diet for Irritable Bowel Syndrome (IBS).  Current as of: August 21, 2019Content Version: 12.4  ?? 2006-2020 Health[employer], Inc[employer].  Care instructions adapted under license by [facility]. If you have questions about a medical condition or this instruction, always ask your healthcare professional. Health[employer], Inc[employer] disclaims any warranty or liability for your use of this information.

## 2019-01-11 NOTE — Unmapped (Signed)
Phone Visit Note  This medical encounter was conducted virtually using [email or web address]  TeleHealth protocols.      I have identified myself to the patient and conveyed my credentials to [name]  Patient/proxy has provided verbal consent to proceed with this phone visit.  In case we get disconnected, patient's phone number is [phone] (home)   In case of Emergency, patient's current location: Home: [address]  [address]  [place] [address]  Is there someone else in the room? No.       [name] is a 58 y.o. female requesting a telephone visit today regarding the following issues:    Assessment/Plan:      1. Irritable bowel syndrome, unspecified type  Describes classic IBS symptoms, consistent with her GI symptoms after the death of her son in 35. No red flags of fevers, chills, weight loss, blood in stool at this time, though is long overdue for CRC screening (recent c-scope postponed due to coronavirus). Plan to work on low FODMAP diet (reviewed and handout in AVS) and try prn dicyclomine. She also has plans for engaging in more regular psychiatric care which I suspect may help. If not improving in the next couple weeks she should reach out and we can consider need for additional work up.  - dicyclomine (BENTYL) 10 mg capsule; Take 2 capsules (20 mg total) by mouth 4 (four) times a day as needed.  Dispense: 240 capsule; Refill: 1    Regarding ear, recommend in person evaluation for possible persistent effusion. Will ask scheduler to call.    Followup:  Return in about 3 weeks (around 02/01/2019) for GI symptom check.    I spent 12 minutes on the phone with the patient. I spent an additional 4 minutes on pre- and post-visit activities.     The patient was physically located in [place] or a state in which I am permitted to provide care. The patient understood that s/he may incur co-pays and cost sharing, and agreed to the telemedicine visit. The visit was completed via phone and/or video, which was appropriate and reasonable under the circumstances given the patient's presentation at the time.    The patient has been advised of the potential risks and limitations of this mode of treatment (including, but not limited to, the absence of in-person examination) and has agreed to be treated using telemedicine. The patient's/patient's family's questions regarding telemedicine have been answered.     If the phone/video visit was completed in an ambulatory setting, the patient has also been advised to contact their provider???s office for worsening conditions, and seek emergency medical treatment and/or call 911 if the patient deems either necessary.       Coding: Full E/M Visit: Medicare/Medicaid/Aetna/Cigna/Humana/UMR/: In charge capture section or LOS section use 99441 (5-10 minutes); 99442 (11-20 minutes); and 91478 (21-30 minutes)    Could this visit have been a video visit? yes.    Subjective:     HPI  [name] is a 58 y.o. female requesting a telephone visit to discuss the following issues:    # Abdominal symptoms  - Bad abdominal symptoms, especially when she thinks about something stressful  - Having gas, bloating, heartburn, worse after eating  Pain is more in bottom part of abdomen  - Has been taking Pepcid four times a day  - Having both diarrhea and constipation  - Stomach hurts more after she eats, lots of gas  - Feels better transiently after having a BM  -  No blood in stools, no fevers or chills    # Ear Symptoms  - Feels like she still has fluid behind her ear  - Present for 2 months now  - Painful at times, very bothersome  - Using antihistamine and nasal steroid    ROS  As per HPI.      HISTORY:  I have reviewed the patient's problem list, current medications, and allergies and have updated/reconciled them as needed.      Objective:     As part of this Telephone Visit, no in-person exam was conducted.    [facility]  [place] [facility] [place] [facility]  [address] [place], [place] [address] ??? Telephone [phone] ??? Fax [phone]  [email or web address]

## 2019-01-11 NOTE — Unmapped (Signed)
Telehealth Pre -Visit Note      01/11/19   PCP: Soyla Murphy, MD  Encounter Provider: Soyla Murphy, MD     [name] is a 58 y.o. female    Chief Complaint   Patient presents with   ??? Follow-up       COVID-19 Travel Screening:                    Home Vitals  Home Weights: 133lb    No Known Allergies     Health Maintenance Due   Topic Date Due   ??? DTaP/Tdap/Td Vaccines (1 - Tdap) 02/02/1973   ??? Zoster Vaccines (1 of 2) 02/03/2012   ??? Mammogram Start Age 60  06/10/2017   ??? Colonoscopy  09/23/2017       Healthcare Decision Maker:     Current Outpatient Medications on File Prior to Visit   Medication Sig Dispense Refill   ??? albuterol HFA 90 mcg/actuation inhaler Inhale 2 puffs every six (6) hours as needed for wheezing. 54 g 6   ??? buPROPion (WELLBUTRIN SR) 150 MG 12 hr tablet Take 1 tablet (150 mg total) by mouth Two (2) times a day. 60 tablet 2   ??? cetirizine (ZYRTEC) 10 MG tablet Take 1 tablet (10 mg total) by mouth daily. 90 tablet 3   ??? clobetasoL (TEMOVATE) 0.05 % cream Apply topically Two (2) times a day. 30 g 0   ??? diclofenac sodium (VOLTAREN) 1 % gel Apply 2 grams topically to small joint and 4 grams to large joints as needed up to 4 times daily 300 g PRN   ??? DULoxetine (CYMBALTA) 60 MG capsule Take 2 capsules (120 mg total) by mouth daily. 60 capsule 3   ??? empty container (BD SHARPS COLLECTOR) Misc Use as directed. 1 each 3   ??? famotidine (PEPCID) 40 MG tablet Take 1 tablet (40 mg total) by mouth every evening. 360 tablet 0   ??? fluticasone propionate (FLONASE) 50 mcg/actuation nasal spray Use 2 sprays in each nostril daily. 16 g 4   ??? folic acid (FOLVITE) 1 MG tablet Take 1 tablet (1 mg total) by mouth daily. 90 tablet 3   ??? gabapentin (NEURONTIN) 300 MG capsule TAKE 1 CAPSULE BY MOUTH 3 TIMES DAILY 270 capsule 0   ??? methotrexate 25 mg/mL injection solution Inject 1 mL (25 mg total) under the skin once a week. 4 mL 2   ??? peg-electrolyte soln (GOLYTELY) 420 gram SolR Take as directed 1 Bottle 0   ??? polyethylene glycol (MIRALAX) 17 gram packet Mix the contents of 1 packet (17 g) in 4-8 ounces of water, juice, soda, coffee, or tea and take by mouth daily for 5 days, then use as needed 30 packet 3   ??? syringe with needle 1 mL 27 x 1/2 Syrg USE AS DIRECTED FOR WEEKLY METHOTREXATE INJECTIONS 50 each 0   ??? tiotropium bromide (SPIRIVA RESPIMAT) 1.25 mcg/actuation Mist Inhale 2 puffs (2.5 mcg) BY MOUTH daily. 4 g 5   ??? tiZANidine (ZANAFLEX) 4 MG tablet Take 1 tablet (4 mg total) by mouth every eight (8) hours as needed. 90 tablet 0   ??? traZODone (DESYREL) 50 MG tablet Take 2 tablets (100 mg total) by mouth nightly. 180 tablet 3     No current facility-administered medications on file prior to visit.        Refills Requests Reviewed and and requested medications have been pended.    Screeners:  PHQ-9 PHQ-9 TOTAL SCORE   10/25/2018 22   07/09/2015 14   10/31/2014 20   09/22/2014 18   07/25/2014 22                 [facility]  [place] [facility] [place] [facility]  [address], [place], [place] [address] ??? Telephone [phone] ??? Fax [phone]  [email or web address]

## 2019-01-12 ENCOUNTER — Encounter: Admit: 2019-01-12 | Discharge: 2019-01-13 | Payer: MEDICARE

## 2019-01-12 DIAGNOSIS — H6505 Acute serous otitis media, recurrent, left ear: Secondary | ICD-10-CM

## 2019-01-12 DIAGNOSIS — L409 Psoriasis, unspecified: Secondary | ICD-10-CM

## 2019-01-12 DIAGNOSIS — J438 Other emphysema: Secondary | ICD-10-CM

## 2019-01-12 DIAGNOSIS — Z006 Encounter for examination for normal comparison and control in clinical research program: Principal | ICD-10-CM

## 2019-01-12 DIAGNOSIS — F17200 Nicotine dependence, unspecified, uncomplicated: Secondary | ICD-10-CM

## 2019-01-12 DIAGNOSIS — Z1231 Encounter for screening mammogram for malignant neoplasm of breast: Secondary | ICD-10-CM

## 2019-01-12 LAB — CBC W/ AUTO DIFF
BASOPHILS ABSOLUTE COUNT: 0 10*9/L (ref 0.0–0.1)
BASOPHILS RELATIVE PERCENT: 0.5 %
EOSINOPHILS RELATIVE PERCENT: 3.8 %
HEMATOCRIT: 44 % (ref 36.0–46.0)
HEMATOCRIT: 44 % — ABNORMAL HIGH (ref 36.0–46.0)
LARGE UNSTAINED CELLS: 3 % (ref 0–4)
LYMPHOCYTES ABSOLUTE COUNT: 2.2 10*9/L (ref 1.5–5.0)
LYMPHOCYTES RELATIVE PERCENT: 30.1 %
MEAN CORPUSCULAR HEMOGLOBIN CONC: 32.4 g/dL (ref 31.0–37.0)
MEAN CORPUSCULAR HEMOGLOBIN: 34 pg (ref 26.0–34.0)
MEAN CORPUSCULAR VOLUME: 104.9 fL — ABNORMAL HIGH (ref 80.0–100.0)
MEAN PLATELET VOLUME: 7.3 fL (ref 7.0–10.0)
MONOCYTES ABSOLUTE COUNT: 0.4 10*9/L (ref 0.2–0.8)
MONOCYTES RELATIVE PERCENT: 5.8 %
NEUTROPHILS ABSOLUTE COUNT: 4.1 10*9/L (ref 2.0–7.5)
NEUTROPHILS RELATIVE PERCENT: 57.3 %
PLATELET COUNT: 287 10*9/L (ref 150–440)
RED BLOOD CELL COUNT: 4.2 10*12/L (ref 4.00–5.20)
RED CELL DISTRIBUTION WIDTH: 15.5 % — ABNORMAL HIGH (ref 12.0–15.0)
WBC ADJUSTED: 7.2 10*9/L (ref 4.5–11.0)

## 2019-01-12 LAB — COMPREHENSIVE METABOLIC PANEL
ALBUMIN: 4 g/dL (ref 3.5–5.0)
ALKALINE PHOSPHATASE: 87 U/L (ref 38–126)
ANION GAP: 4 mmol/L — ABNORMAL LOW (ref 7–15)
AST (SGOT): 23 U/L (ref 14–38)
BILIRUBIN TOTAL: 0.3 mg/dL (ref 0.0–1.2)
BLOOD UREA NITROGEN: 10 mg/dL (ref 7–21)
BLOOD UREA NITROGEN: 10 mg/dL — ABNORMAL LOW (ref 7–21)
BUN / CREAT RATIO: 13
CALCIUM: 9.9 mg/dL (ref 8.5–10.2)
CHLORIDE: 109 mmol/L — ABNORMAL HIGH (ref 98–107)
CO2: 30 mmol/L (ref 22.0–30.0)
CREATININE: 0.78 mg/dL (ref 0.60–1.00)
EGFR CKD-EPI AA FEMALE: >90 mL/min/{1.73_m2} (ref >=60)
EGFR CKD-EPI NON-AA FEMALE: 85 mL/min/{1.73_m2} (ref >=60)
GLUCOSE RANDOM: 63 mg/dL — ABNORMAL LOW (ref 70–179)
PROTEIN TOTAL: 6.9 g/dL (ref 6.5–8.3)

## 2019-01-12 LAB — SEDIMENTATION RATE, MANUAL: ERYTHROCYTE SEDIMENTATION RATE: 8 mm/h (ref 0–30)

## 2019-01-12 MED ORDER — AMOXICILLIN 500 MG CAPSULE
ORAL_CAPSULE | Freq: Three times a day (TID) | ORAL | 0 refills | 0.00000 days | Status: CP
Start: 2019-01-12 — End: 2019-01-22
  Filled 2019-01-12: qty 30, 10d supply, fill #0

## 2019-01-12 MED FILL — AMOXICILLIN 500 MG CAPSULE: 10 days supply | Qty: 30 | Fill #0 | Status: AC

## 2019-01-12 NOTE — Unmapped (Signed)
Chief Complaint   Patient presents with   ??? Otalgia     left, fluid       ASSESSMENT/PLAN:    Problem List Items Addressed This Visit        Respiratory    COPD (chronic obstructive pulmonary disease) (CMS-HCC)       Musculoskeletal and Integument    Psoriasis       Other    Tobacco use disorder      Other Visit Diagnoses     Research study patient    -  Primary    Relevant Orders    COVID-19 IgG    Encounter for screening mammogram for breast cancer        Relevant Orders    Mammo Screening Bilateral    Recurrent acute serous otitis media of left ear        Relevant Medications    amoxicillin (AMOXIL) 500 MG tablet          Prolonged serous otitis  Ok to try amoxicillin for 10 days  If persists to have f/u with [place]  Counseled on smoking 3 mins- also referred quitlinenc  Updated problem list annotations  Database review and update  Med review and update  Lab review and update  Counseling provided and patient education    SUBJECTIVE:    [name] is a 58 y.o. female that presents to clinic today regarding the following issues:    Left ear feels like hears swishing noise- painful- hears ringing  Has talked with Dr. Laurance Flatten several times.   PMH similar for same, also depression, insomnia, ptsd, psoriatic arthritis, rheumatoid arthritis, AR, tobacco abuse, vertigo, copd  Hearing ok     reports that she has been smoking cigarettes. She started smoking about 45 years ago. She has a 30.00 pack-year smoking history. She has never used smokeless tobacco.    The following portions of the patient's history were reviewed and updated as appropriate: allergies, current medications, past family history, past medical history, past social history, past surgical history and problem list.    Review of systems for 10 organ systems conducted and no significant positives      OBJECTIVE:    Vital signs have been reviewed:   Vitals:    01/12/19 1401   BP: 128/88   Pulse: 89   Temp: 37 ??C (98.6 ??F)     vss  heent serous fluid left tm- some bulging

## 2019-01-12 NOTE — Unmapped (Signed)
Patient Education        Diet for Irritable Bowel Syndrome: Care Instructions  Your Care Instructions    Irritable bowel syndrome, or IBS, is a problem with the intestines. IBS can cause belly pain, bloating, gas, constipation, and diarrhea. Most people can control their symptoms by changing their diet and easing stress.  No specific foods cause everyone with IBS to have symptoms. Many people find that they feel better by limiting or eliminating foods that may bring on symptoms. Make sure you don't stop eating all foods from any one food group without talking with a dietitian. You need to make sure you are still getting all the nutrients you need.  Follow-up care is a key part of your treatment and safety. Be sure to make and go to all appointments, and call your doctor if you are having problems. It's also a good idea to know your test results and keep a list of the medicines you take.  How can you care for yourself at home?  To reduce constipation  ?? Include fruits, vegetables, beans, and whole grains in your diet each day. These foods are high in fiber. Slowly increase the amount of fiber you eat. This helps you avoid a lot of gas.  ?? Drink plenty of fluids. If you have kidney, heart, or liver disease and have to limit fluids, talk with your doctor before you increase the amount of fluids you drink.  ?? Get some exercise every day. Build up slowly to 30 to 60 minutes a day on 5 or more days of the week.  ?? Take a fiber supplement, such as Citrucel or Metamucil, every day if needed. Read and follow all instructions on the label.  ?? Schedule time each day for a bowel movement. Having a daily routine may help. Take your time and do not strain when having a bowel movement.  ?? Check with your doctor before you increase the amount of fiber in your diet. For some people who have IBS, eating more fiber may make some symptoms worse. This includes bloating.  To reduce diarrhea  You may try giving up foods or drinks one at a time to see whether symptoms improve. Limit or avoid the following:  ?? Alcohol  ?? Caffeine, which is found in coffee, tea, cola drinks, and chocolate  ?? Nicotine, from smoking or chewing tobacco  ?? Gas-producing foods, such as beans, broccoli, cabbage, and apples  ?? Dairy products that contain lactose (milk sugar), such as ice cream and milk.  ?? Foods and drinks high in sugar, especially fruit juice, soda, candy, and other packaged sweets (such as cookies)  ?? Foods high in fat, including bacon, sausage, butter, oils, and anything deep-fried  ?? Sorbitol and xylitol, artificial sweeteners found in some sugarless candies and chewing gum  Keep track of foods  ?? Some people with IBS use a daily food diary to keep track of what they eat and whether they have any symptoms after eating certain foods. The diary also can be a good way to record what is going on in your life.  ?? Stress plays a role in IBS. So if you are aware that certain stresses bring on symptoms, you can try to reduce those stresses.  Keep mealtimes pleasant  ?? Try to maintain a pleasant environment when you eat. This may reduce stress that can make symptoms likely to occur.  ?? Give yourself plenty of time to eat, rather than eating on the go. Chew  your food slowly. Try not to swallow air, which can cause bloating.  Where can you learn more?  Go to [facility] at https://myuncchart.org  [employer] [facility] under [employer]. Enter [phone] in the search box to learn more about Diet for Irritable Bowel Syndrome: Care Instructions.  Current as of: August 21, 2019Content Version: 12.4  ?? 2006-2020 Healthwise, Inc[employer].  Care instructions adapted under license by [facility]. If you have questions about a medical condition or this instruction, always ask your healthcare professional. Healthwise, Incorporated disclaims any warranty or liability for your use of this information.

## 2019-01-12 NOTE — Unmapped (Signed)
Addended byGwenlyn Fudge, Athira Janowicz on: 01/12/2019 02:45 PM     Modules accepted: Orders

## 2019-01-12 NOTE — Unmapped (Signed)
Addended by: Ova Freshwater A on: 01/12/2019 02:48 PM     Modules accepted: Orders

## 2019-01-17 MED FILL — GABAPENTIN 300 MG CAPSULE: 90 days supply | Qty: 270 | Fill #0 | Status: AC

## 2019-01-19 NOTE — Unmapped (Addendum)
[facility] Refill Coordination Note    Specialty Medication(s) to be Shipped:   Inflammatory Disorders: methotrexate (injectable)    Other medication(s) to be shipped: syringes and fluticasone nasal spray     Michele Miller, DOB: [birth year]  Phone: [phone] (home)       All above HIPAA information was verified with patient.     Completed refill call assessment today to schedule patient's medication shipment from the [facility] [phone]).       Specialty medication(s) and dose(s) confirmed: Regimen is correct and unchanged.   Changes to medications: Michele Miller reports no changes at this time.  Changes to insurance: No  Questions for the pharmacist: No    Confirmed patient received Welcome Packet with first shipment. The patient will receive a drug information handout for each medication shipped and additional FDA Medication Guides as required.       DISEASE/MEDICATION-SPECIFIC INFORMATION        For patients on injectable medications: Patient currently has 1 doses left.  Next injection is scheduled for 01/19/19.    SPECIALTY MEDICATION ADHERENCE     Medication Adherence    Specialty Medication:  methotrexate          Methotrexate 25 mg/ml: 7 days of medicine on hand (1 injection)       SHIPPING     Shipping address confirmed in Epic.     Delivery Scheduled: Yes, Expected medication delivery date: 01/21/19.     Medication will be delivered via Same Day Courier to the temporary address in Epic WAM.    [facility] Specialty Pharmacist

## 2019-01-21 NOTE — Unmapped (Signed)
Michele Miller's Methotrexate and syringes shipment will be delayed due to refill required for syringes.  Patient has been contacted and delivery has been rescheduled for Monday, May 11th.

## 2019-01-21 NOTE — Unmapped (Signed)
Care Management Progress Note  [facility]                 Date of Service:  01/21/2019      Service: Care Management - phone    Purpose of contact: To offer enrollment in [facility] Management) program.     Additional Information/Plan: [name] phone does not appear to be working, or the number has changed. I sent her a mychart message with my direct contact information and offered her enrollment in the CoCM program as a way to connect her to mental health resources given that her attempts at connecting with [facility] psychiatry have not been successful. During our conversation on 3/12 she endorsed to me fairly high levels of psychological distress related to the sudden traumatic loss of her son 4 years ago. She has also experienced periods homelessness.    I will continue to make attempts at connecting [name] to suitable mental health resources.      Tyrell Antonio, MSW, LCSW  Care Manager - [facility]  [phone]

## 2019-01-24 MED FILL — FLUTICASONE PROPIONATE 50 MCG/ACTUATION NASAL SPRAY,SUSPENSION: NASAL | 30 days supply | Qty: 16 | Fill #3

## 2019-01-24 MED FILL — METHOTREXATE SODIUM 25 MG/ML INJECTION SOLUTION: SUBCUTANEOUS | 28 days supply | Qty: 4 | Fill #2

## 2019-01-24 MED FILL — FLUTICASONE PROPIONATE 50 MCG/ACTUATION NASAL SPRAY,SUSPENSION: 30 days supply | Qty: 16 | Fill #3 | Status: AC

## 2019-01-24 MED FILL — METHOTREXATE SODIUM 25 MG/ML INJECTION SOLUTION: 28 days supply | Qty: 4 | Fill #2 | Status: AC

## 2019-01-26 NOTE — Unmapped (Signed)
Care Management Progress Note  [facility]                 Date of Service:  01/26/2019      Service: Care Management - phone    Purpose of contact: Attempted to offer enrollment in CoCM program to [name].     Additional Information/Plan: Her phone went straight to voicemail, but there is no voicemail set up yet, so no option to leave a message. I have previously sent her a MyChart message, which states that it has not been read yet. Will make one more attempt at reaching [name].     Tyrell Antonio, MSW, LCSW  Care Manager - [facility]  [phone]

## 2019-01-27 MED ORDER — SYRINGE WITH NEEDLE 1 ML 27 X 1/2"
SUBCUTANEOUS | 0 refills | 350.00000 days | Status: CP
Start: 2019-01-27 — End: ?

## 2019-01-27 NOTE — Unmapped (Signed)
Syringe refill  Last ov: 08/05/2018  Next ov: 02/17/2019

## 2019-01-27 NOTE — Unmapped (Signed)
Care Management Progress Note  [facility]             Date of Service:  01/27/2019  Service: Care Management - phone    Purpose of contact:        Attempted to reach patient via telephone, unable to leave voicemail.     Additional Information/Plan:  Patient has not returned call. Have previously sent MyChart messages. No further attempts to contact patient regarding this referral to CoCM will be made at this time.    Carloyn Jaeger, MSW, [facility]  [phone]

## 2019-02-04 NOTE — Unmapped (Signed)
This encounter was created in error - please disregard.

## 2019-02-09 MED FILL — CYMBALTA 60 MG CAPSULE,DELAYED RELEASE: 30 days supply | Qty: 60 | Fill #3 | Status: AC

## 2019-02-09 MED FILL — CYMBALTA 60 MG CAPSULE,DELAYED RELEASE: ORAL | 30 days supply | Qty: 60 | Fill #3

## 2019-02-10 MED ORDER — METHOTREXATE SODIUM 25 MG/ML INJECTION SOLUTION
SUBCUTANEOUS | 2 refills | 28 days | Status: CP
Start: 2019-02-10 — End: ?

## 2019-02-10 NOTE — Unmapped (Signed)
[facility] Refill Coordination Note    Specialty Medication(s) to be Shipped:   Inflammatory Disorders: methotrexate (injectable)    Other medication(s) to be shipped: BD Tuberculin Syringe     Michele Miller, DOB: [birth year]  Phone: [phone] (home)       All above HIPAA information was verified with patient.     Completed refill call assessment today to schedule patient's medication shipment from the [facility] [phone]).       Specialty medication(s) and dose(s) confirmed: Regimen is correct and unchanged.   Changes to medications: [name] reports no changes at this time.  Changes to insurance: No  Questions for the pharmacist: No    Confirmed patient received Welcome Packet with first shipment. The patient will receive a drug information handout for each medication shipped and additional FDA Medication Guides as required.       DISEASE/MEDICATION-SPECIFIC INFORMATION        For patients on injectable medications: Patient currently has 0 doses left.  Next injection is scheduled for 02/18/19.    SPECIALTY MEDICATION ADHERENCE     Medication Adherence    Patient reported X missed doses in the last month:  0  Specialty Medication:  Methotrexate injection  Patient is on additional specialty medications:  No  Patient is on more than two specialty medications:  No  Any gaps in refill history greater than 2 weeks in the last 3 months:  no  Demonstrates understanding of importance of adherence:  yes  Informant:  patient                Methotrexate 25mg /ml: Patient has 7 days of medication on hand      SHIPPING     Shipping address confirmed in Epic.     Delivery Scheduled: Yes, Expected medication delivery date: 02/16/19.     Medication will be delivered via Same Day Courier to the temporary address in Epic WAM.    [name]   [facility] Specialty Technician

## 2019-02-10 NOTE — Unmapped (Signed)
Methotrexate refill  Last ov: 12/2018  Next ov: 02/17/2019   Labs:   AST   Date Value Ref Range Status   01/12/2019 23 14 - 38 U/L Final   07/31/2014 24 14 - 38 U/L Final     ALT   Date Value Ref Range Status   01/12/2019 15 <35 U/L Final   07/31/2014 27 15 - 48 U/L Final     Creatinine   Date Value Ref Range Status   01/12/2019 0.78 0.60 - 1.00 mg/dL Final   81/19/1478 2.95 0.60 - 1.00 mg/dL Final     WBC   Date Value Ref Range Status   01/12/2019 7.2 4.5 - 11.0 10*9/L Final   07/31/2014 6.5 4.5 - 11.0 10*9/L Final     HGB   Date Value Ref Range Status   01/12/2019 14.3 12.0 - 16.0 g/dL Final   62/13/0865 78.4 12.0 - 16.0 g/dL Final     HCT   Date Value Ref Range Status   01/12/2019 44.0 36.0 - 46.0 % Final   07/31/2014 46.5 (H) 36.0 - 46.0 % Final     MCV   Date Value Ref Range Status   01/12/2019 104.9 (H) 80.0 - 100.0 fL Final   07/31/2014 100 80 - 100 fL Final     RDW   Date Value Ref Range Status   01/12/2019 15.5 (H) 12.0 - 15.0 % Final   07/31/2014 14.9 12.0 - 15.0 % Final     Platelet   Date Value Ref Range Status   01/12/2019 287 150 - 440 10*9/L Final   07/31/2014 261 150 - 440 10*9/L Final     Neutrophils %   Date Value Ref Range Status   01/12/2019 57.3 % Final     Lymphocytes %   Date Value Ref Range Status   01/12/2019 30.1 % Final     Monocytes %   Date Value Ref Range Status   01/12/2019 5.8 % Final     Eosinophils %   Date Value Ref Range Status   01/12/2019 3.8 % Final     Basophils %   Date Value Ref Range Status   01/12/2019 0.5 % Final

## 2019-02-11 MED FILL — BUPROPION HCL SR 150 MG TABLET,12 HR SUSTAINED-RELEASE: ORAL | 30 days supply | Qty: 60 | Fill #2

## 2019-02-11 MED FILL — BUPROPION HCL SR 150 MG TABLET,12 HR SUSTAINED-RELEASE: 30 days supply | Qty: 60 | Fill #2 | Status: AC

## 2019-02-14 ENCOUNTER — Ambulatory Visit: Admit: 2019-02-14 | Discharge: 2019-02-15

## 2019-02-14 DIAGNOSIS — M5412 Radiculopathy, cervical region: Secondary | ICD-10-CM

## 2019-02-14 DIAGNOSIS — M4722 Other spondylosis with radiculopathy, cervical region: Secondary | ICD-10-CM

## 2019-02-14 DIAGNOSIS — L4050 Arthropathic psoriasis, unspecified: Principal | ICD-10-CM

## 2019-02-14 MED ORDER — TIZANIDINE 4 MG TABLET
ORAL_TABLET | Freq: Three times a day (TID) | ORAL | 3 refills | 0.00000 days | Status: CP | PRN
Start: 2019-02-14 — End: 2020-02-14
  Filled 2019-02-25: qty 90, 30d supply, fill #0

## 2019-02-14 NOTE — Unmapped (Signed)
??   Your diagnosis: The primary encounter diagnosis was Cervical spondylosis with radiculopathy. Diagnoses of Psoriatic arthritis (CMS-HCC) and Cervical radiculopathy were also pertinent to this visit.    ?? Recommendations/Plan  ?? Medications: Take over-the-counter medications as needed and as tolerated  ?? Take medications as precribed by your Medical Team  ?? Monitor your symptoms. Be mindful of the warning symptoms we discussed.  ?? Imaging studies: MRI of the Cervical spine was ordered. Medical necessity: Radiographs have been performed/ordered.  ?? Labs: None  Return for Next scheduled follow up in clinic after MRI is obtained.    ?? Avoid nicotine/tobacco  ?? Minimize alcohol intake  ?? Maintain a healthy weight    ?? Stay physically active and exercise regularly as tolerated ([email or web address])  ?? Maintain good sleeping habits    ?? We want to improve, and you can help.  You may receive a survey asking you about your visit.  Please take a few minutes to complete the survey.  We will use your feedback to make improvements.    ?? Contact our team electronically via MyChart message to [facility] Clinical Staff.    ?? Contact our team at [phone] or [phone] with any questions/concerns.
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
## 2019-02-14 NOTE — Unmapped (Signed)
Addended by: Philippa Sicks on: 02/14/2019 12:22 PM     Modules accepted: Orders

## 2019-02-16 MED FILL — BD TUBERCULIN SYRINGE 1 ML 27 X 1/2": 28 days supply | Qty: 4 | Fill #0 | Status: AC

## 2019-02-16 MED FILL — METHOTREXATE SODIUM 25 MG/ML INJECTION SOLUTION: SUBCUTANEOUS | 28 days supply | Qty: 4 | Fill #0

## 2019-02-16 MED FILL — BD TUBERCULIN SYRINGE 1 ML 27 X 1/2": SUBCUTANEOUS | 28 days supply | Qty: 4 | Fill #0

## 2019-02-16 MED FILL — METHOTREXATE SODIUM 25 MG/ML INJECTION SOLUTION: 28 days supply | Qty: 4 | Fill #0 | Status: AC

## 2019-02-17 ENCOUNTER — Encounter: Admit: 2019-02-17 | Discharge: 2019-02-18 | Payer: MEDICARE | Attending: Rheumatology | Primary: Rheumatology

## 2019-02-17 ENCOUNTER — Encounter: Admit: 2019-02-17 | Discharge: 2019-02-18 | Payer: MEDICARE

## 2019-02-17 ENCOUNTER — Ambulatory Visit: Admit: 2019-02-17 | Discharge: 2019-02-18 | Payer: MEDICARE

## 2019-02-17 DIAGNOSIS — L4050 Arthropathic psoriasis, unspecified: Principal | ICD-10-CM

## 2019-02-17 DIAGNOSIS — G8929 Other chronic pain: Secondary | ICD-10-CM

## 2019-02-17 DIAGNOSIS — M25521 Pain in right elbow: Secondary | ICD-10-CM

## 2019-02-17 DIAGNOSIS — M5412 Radiculopathy, cervical region: Secondary | ICD-10-CM

## 2019-02-17 DIAGNOSIS — M4722 Other spondylosis with radiculopathy, cervical region: Principal | ICD-10-CM

## 2019-02-17 NOTE — Unmapped (Signed)
February 17, 2019 10:40 AM    REASON FOR VISIT: PsA and joint pain    Identification: Pt self identified using name and date of birth  Patient location: [place]  The limitations of this telemedicine encounter were discussed with patient. Both the patient and myself agreed to this encounter despite these limitations. Benefits of this telemedicine encounter included allowing for continued care of patient and minimizing risk of exposure to COVID-19. Patient also aware that this is a billable encounter with possible copay.     HISTORY: [name] is a 58 y.o. female with hx of psoriatic arthritis. She also has clinical findings suggestive of centralized chronic pain and imaging c/w DDD. Was following pain management but has lost f/u due to transportation issues.  Psoriatic arthritis treated with mtx SQ monotherapy. Also on pharmacotherapy for fibromyalgia from PCP. She has multiple psychiatric comorbidities including PTSD and anxiety. These have been exacerbated by her son's untimely death in 07-Mar-2014.       Interim history:  Pt presents via phone call for follow up.   On phone visit with [place] [place] 12/30/18 pt again endorsed worsening hand swelling, back pain, psoriasis, got clobetasol, labs.  Seen by spine center 02/14/19, will have MRI of cervical and lumbar spine today. Will be done in [place] at 2:45pm.  She reports constant pain in her back, previously more like intermittent muscle spasms but now hurts all the time.  Also endorses elbow pain, grinding on the right, denies redness or warmth, persistent x months or more. Endorses a fall recently. Notes some puffy swelling of the hands, morning stiffness persists most of the day    REVIEW OF SYSTEMS: Attests to the above, otherwise all other review of systems is negative.    CURRENT MEDICATIONS:  Current Outpatient Medications   Medication Sig Dispense Refill   ??? albuterol HFA 90 mcg/actuation inhaler Inhale 2 puffs every six (6) hours as needed for wheezing. 54 g 6   ??? buPROPion (WELLBUTRIN SR) 150 MG 12 hr tablet Take 1 tablet (150 mg total) by mouth Two (2) times a day. 60 tablet 2   ??? cetirizine (ZYRTEC) 10 MG tablet Take 1 tablet (10 mg total) by mouth daily. 90 tablet 3   ??? clobetasoL (TEMOVATE) 0.05 % cream Apply topically Two (2) times a day. 30 g 0   ??? diclofenac sodium (VOLTAREN) 1 % gel Apply 2 grams topically to small joint and 4 grams to large joints as needed up to 4 times daily 300 g PRN   ??? dicyclomine (BENTYL) 10 mg capsule Take 2 capsules (20 mg total) by mouth 4 (four) times a day as needed. 240 capsule 1   ??? DULoxetine (CYMBALTA) 60 MG capsule Take 2 capsules (120 mg total) by mouth daily. 60 capsule 3   ??? empty container (BD SHARPS COLLECTOR) Misc Use as directed. 1 each 3   ??? famotidine (PEPCID) 40 MG tablet Take 1 tablet (40 mg total) by mouth every evening. 360 tablet 0   ??? fluticasone propionate (FLONASE) 50 mcg/actuation nasal spray Use 2 sprays in each nostril daily. 16 g 4   ??? folic acid (FOLVITE) 1 MG tablet Take 1 tablet (1 mg total) by mouth daily. 90 tablet 3   ??? gabapentin (NEURONTIN) 300 MG capsule TAKE 1 CAPSULE BY MOUTH 3 TIMES DAILY 270 capsule 0   ??? methotrexate 25 mg/mL injection solution Inject 1 mL (25 mg total) under the skin once a week. 4 mL 2   ??? peg-electrolyte  soln (GOLYTELY) 420 gram SolR Take as directed 1 Bottle 0   ??? polyethylene glycol (MIRALAX) 17 gram packet Mix the contents of 1 packet (17 g) in 4-8 ounces of water, juice, soda, coffee, or tea and take by mouth daily for 5 days, then use as needed 30 packet 3   ??? syringe with needle 1 mL 27 x 1/2 Syrg Use as directed for weekly Methotrexate injections 50 each 0   ??? tiotropium bromide (SPIRIVA RESPIMAT) 1.25 mcg/actuation Mist Inhale 2 puffs (2.5 mcg) BY MOUTH daily. 4 g 5   ??? tiZANidine (ZANAFLEX) 4 MG tablet Take 1 tablet (4 mg total) by mouth every eight (8) hours as needed. 90 tablet 3   ??? traZODone (DESYREL) 50 MG tablet Take 2 tablets (100 mg total) by mouth nightly. 180 tablet 3     No current facility-administered medications for this visit.        Past Medical History:   Diagnosis Date   ??? Arthritis    ??? Baker's cyst    ??? COPD (chronic obstructive pulmonary disease) (CMS-HCC)    ??? CTS (carpal tunnel syndrome)    ??? Depression    ??? Headache(784.0)    ??? Joint pain    ??? Psoriasis    ??? PTSD (post-traumatic stress disorder)    ??? Scoliosis         Immunization History   Administered Date(s) Administered   ??? INFLUENZA TIV (TRI) PF (IM) 06/22/2012   ??? Influenza Vaccine Quad (IIV4 PF) 33mo+ injectable 09/26/2015, 06/23/2016, 10/25/2018   ??? Influenza Virus Vaccine, unspecified formulation 07/03/2013   ??? PNEUMOCOCCAL POLYSACCHARIDE 23 11/03/2013       PHYSICAL EXAM:  General:   Does not sound to be in distress   Lungs:  No wheezing, coughing, or increased respiratory effort noted   Psych:  Appropriate interaction     Record Review: Available records were reviewed, including pertinent office visits, labs, and imaging.   Lab Results   Component Value Date    WBC 7.2 01/12/2019    RBC 4.20 01/12/2019    HGB 14.3 01/12/2019    HCT 44.0 01/12/2019    MCV 104.9 (H) 01/12/2019    MCH 34.0 01/12/2019    MCHC 32.4 01/12/2019    RDW 15.5 (H) 01/12/2019    PLT 287 01/12/2019      Lab Results   Component Value Date    CREATININE 0.78 01/12/2019      Lab Results   Component Value Date    ALKPHOS 87 01/12/2019    BILITOT 0.3 01/12/2019    BILIDIR <0.10 01/18/2016    PROT 6.9 01/12/2019    ALBUMIN 4.0 01/12/2019    ALT 15 01/12/2019    AST 23 01/12/2019        ASSESSMENT/PLAN:    Orders Placed This Encounter   Procedures   ??? XR Elbow 3 Or More Views Right     1. Psoriatic arthritis (CMS-HCC)  Unclear how much actual inflammation she has given prior visits with relatively unremarkable exams and low inflammatory markers.  For now will continue methotrexate 25 mg subcu weekly, folic acid 1 mg daily, clobetasol ointment on psoriatic rashes twice daily as needed.  Will need evaluation in clinic to examine her joints for more objective information.  - Added elbow xray to do while at [facility] for MRI today  - Appreciate spine center evaluation and follow up  - Will re-evaluate in clinic in a couple of months to determine  if additional therapy (e.g., biologic) is indicated for PsA versus ongoing management of chronic pain.  ??  2. Methotrexate, long term, current use  Recent monitoring labs reviewed and unremarkable  ??  3. Osteoarthritis  Suspect that the tender knot in the distal pinky is a [place] node from osteoarthritis, though cannot confirm this over the phone.    - Continue Voltaren gel over affected joints when needed.  ??  4.  DJD L-spine  Her evaluation for etiology of her low back pain is the most consistent with DJD rather than active psoriatic arthritis.  We again discussed this, and discussed that I would not recommend escalating immunosuppressive therapy for her low back pain.  She is already taking multiple different medications for back pain (gabapentin, tizanidine, Cymbalta).  Would not increase pharmacotherapy at this time, but information on stretches for low back pain were sent to her MyChart account to perform before her visit with the spine center.  - Will f/u on MRI results  - Appreciate spine center evaluation  ??  HCM:   - PCV13 Status: Did not discuss today  - PPSV 23 Status: 11/03/2013  - TdaP (Booster once every 10 years). Status:  - Annual Influenza vaccine. Status: 10/25/2018  - Bone health: not on prednisone   - Contraception: Status post hysterectomy    Return in about 2 months (around 04/19/2019). Will schedule for in-person visit for objective examination.    10 minutes was spent with the patient, over half of which was counseling regarding dx and treatment plan.     I spent 10 minutes on the phone with the patient. I spent an additional 2 minutes on pre- and post-visit activities.     The patient was physically located in [place] or a state in which I am permitted to provide care. The patient and/or parent/gauardian understood that s/he may incur co-pays and cost sharing, and agreed to the telemedicine visit. The visit was completed via phone and/or video, which was appropriate and reasonable under the circumstances given the patient's presentation at the time.    The patient and/or parent/guardian has been advised of the potential risks and limitations of this mode of treatment (including, but not limited to, the absence of in-person examination) and has agreed to be treated using telemedicine. The patient's/patient's family's questions regarding telemedicine have been answered.     If the phone/video visit was completed in an ambulatory setting, the patient and/or parent/guardian has also been advised to contact their provider???s office for worsening conditions, and seek emergency medical treatment and/or call 911 if the patient deems either necessary.    Lynann Beaver MD [facility]  Associate Professor of Medicine  Department of Medicine/Division of Rheumatology  [facility]    10:40 AM

## 2019-02-22 ENCOUNTER — Other Ambulatory Visit: Payer: Self-pay

## 2019-02-22 ENCOUNTER — Ambulatory Visit: Payer: Medicaid Other | Admitting: Nurse Practitioner

## 2019-02-22 ENCOUNTER — Encounter: Payer: Self-pay | Admitting: Nurse Practitioner

## 2019-02-22 VITALS — BP 136/89 | HR 79 | Temp 98.6°F

## 2019-02-22 DIAGNOSIS — F17219 Nicotine dependence, cigarettes, with unspecified nicotine-induced disorders: Secondary | ICD-10-CM | POA: Insufficient documentation

## 2019-02-22 DIAGNOSIS — F4310 Post-traumatic stress disorder, unspecified: Secondary | ICD-10-CM | POA: Diagnosis not present

## 2019-02-22 DIAGNOSIS — J449 Chronic obstructive pulmonary disease, unspecified: Secondary | ICD-10-CM | POA: Insufficient documentation

## 2019-02-22 DIAGNOSIS — J410 Simple chronic bronchitis: Secondary | ICD-10-CM

## 2019-02-22 DIAGNOSIS — F332 Major depressive disorder, recurrent severe without psychotic features: Secondary | ICD-10-CM

## 2019-02-22 DIAGNOSIS — H539 Unspecified visual disturbance: Secondary | ICD-10-CM

## 2019-02-22 DIAGNOSIS — J432 Centrilobular emphysema: Secondary | ICD-10-CM | POA: Insufficient documentation
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
## 2019-02-22 NOTE — Assessment & Plan Note (Signed)
Chronic, ongoing.  Needs intensive therapy and psychiatry, is currently on multiple medications.  Denies SI/HI.  Referral, urgent, placed to [facility] in [place].  Will continue current regimen at this time and collaborate with psychiatry.  Return in 4 weeks for follow-up or sooner if worsening symptoms.  Immediately present to ER if suicidal thoughts present.

## 2019-02-22 NOTE — Assessment & Plan Note (Signed)
Chronic, ongoing.  Recommend smoking cessation.  Continue current medication regimen.  Spirometry at next visit.

## 2019-02-22 NOTE — Assessment & Plan Note (Signed)
I have recommended complete cessation of tobacco use. I have discussed various options available for assistance with tobacco cessation including over the counter methods (Nicotine gum, patch and lozenges). We also discussed prescription options (Chantix, Nicotine Inhaler / Nasal Spray). The patient is not interested in pursuing any prescription tobacco cessation options at this time.  
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
## 2019-02-22 NOTE — Patient Instructions (Signed)
Living With Post-Traumatic Stress Disorder  If you have been diagnosed with post-traumatic stress disorder (PTSD), you may be relieved that you now know why you have felt or behaved a certain way. Still, you may feel overwhelmed about the treatment ahead. You may also wonder how to get the support you need and how to deal with the condition day-to-day.  If you are living with PTSD, there are ways to help you recover from it and manage your symptoms.  How to manage lifestyle changes  Managing stress  Stress is your body's reaction to life changes and events, both good and bad. Stress can make PTSD worse. Take the following steps to cope with stress:   Talk with your health care provider or a counselor if you would like to learn more about techniques to reduce your stress. He or she may suggest some stress reduction techniques such as:  ? Muscle relaxation exercises.  ? Regular exercise.  ? Meditation, yoga, or other mind-body exercises.  ? Breathing exercises.  ? Listening to quiet music.  ? Spending time outside.   Maintain a healthy lifestyle. Eat a healthy diet, exercise regularly, get plenty of sleep, and take time to relax.   Spend time with others. Talk with them about how you are feeling and what kind of support you need. Try to not isolate yourself, even though you may feel like doing that. Isolating yourself can delay your recovery.   Do activities and hobbies that you enjoy.   Pace yourself when doing stressful things. Take breaks, and reward yourself when you finish. Make sure that you do not overload your schedule.    Medicines  Your health care provider may suggest certain medicines if he or she feels that they will help to improve your condition. Antidepressants or antipsychotic medicines may be used to treat PTSD. Avoid using alcohol and other substances that may prevent your medicines from working properly (may interact). It is also important to:   Talk with your pharmacist or health care  provider about all medicines that you take, their possible side effects, and which medicines are safe to take together.   Make it your goal to take part in all treatment decisions (shared decision-making). Ask about possible side effects of medicines that your health care provider recommends, and tell him or her how you feel about having those side effects. It is best if shared decision-making with your health care provider is part of your total treatment plan.  If your health care provider prescribes a medicine, you may not notice the full benefits of it for 4-8 weeks. Most people who are treated for PTSD need to take medicine for at least 6-12 months after they feel better. If you are taking medicines as part of your treatment, do not stop taking medicines before you ask your health care provider if it is safe to stop. You may need to have the medicine slowly decreased (tapered) over time to lower the risk of harmful side effects.  Relationships  Many people who have PTSD have difficulty trusting others. Make an effort to:   Take risks and develop trust with close friends and family members. Developing trust in others can help you feel safe and connect you with emotional support.   Be open and honest about your feelings.   Try to have fun and relax in safe spaces, such as with friends and family.   Think about going to couples counseling, family education classes, or family therapy. Your loved   ones may not always know how to be supportive. Therapy can be helpful for everyone.  How to recognize changes in your condition  Be aware of your symptoms and how often you have them. The following symptoms mean that you need to seek help for your PTSD:   You feel suspicious and angry.   You have repeated flashbacks.   You avoid going out or being with others.   You have an increasing number of fights with close friends or family members, such as your spouse.   You have thoughts about hurting yourself or  others.   You cannot get relief from feelings of depression or anxiety.  Where to find support  Talking to others   Explain that PTSD is a mental health problem. It is something that a person can develop after experiencing or seeing a life-threatening event. Tell them that PTSD makes you feel stress like you did during the event.   Talk to your loved ones about the symptoms you have. Also tell them what things or situations can cause symptoms to start (are triggers for you).   Assure your loved ones that there are treatments to help PTSD. Discuss possibly seeking family therapy or couples therapy.   If you are worried or fearful about seeking treatment, ask for support.  Finances  Not all insurance plans cover mental health care, so it is important to check with your insurance carrier. If paying for co-pays or counseling services is a problem, search for a local or county mental health care center. Public mental health care services may be offered there at a low cost or no cost when you are not able to see a private health care provider. If you are a veteran, contact a local veterans organization or veterans hospital for more information.  If you are taking medicine for PTSD, you may be able to get the genericform, which may be less expensive than brand-name medicine. Some makers of prescription medicines also offer help to patients who cannot afford the medicines that they need.  Community resources   Find a support group in your community. Often, groups are available for military veterans, trauma victims, and family members or caregivers.   Look into volunteer opportunities. Taking part in these can help you feel more connected to your community.   Contact a local organization to find out if you are eligible for a service dog.   Keep daily contact with at least one trusted friend or family member.  Follow these instructions at home:  Lifestyle   Exercise regularly. Try to do 30 or more minutes of  physical activity on most days of the week.   Try to get 7-9 hours of sleep each night. To help with sleep:  ? Keep your bedroom cool and dark.  ? Do not eat a heavy meal during the hour before you go to bed.  ? Do not drink alcohol or caffeinated drinks before bed.  ? Avoid screen time before bedtime. This means avoiding use of your TV, computer, tablet, and cell phone.   Avoid using alcohol or drugs.   Practice self-soothing skills and use them daily.   Try to have fun and seek humor in your life.  General instructions   If your PTSD is affecting your marriage or family, seek help from a family therapist.   Take over-the-counter and prescription medicines only as told by your health care provider.   Make sure to let all of your health care providers   know that you have PTSD. This is especially important if you are having surgery or need to be admitted to the hospital.   Keep all follow-up visits as told by your health care providers. This is important.  Where to find more information  Go to this website to find more information about PTSD, treatment for PTSD, and how to get support:   National Center for PTSD: www.ptsd.va.gov  Contact a health care provider if:   Your symptoms get worse or they do not get better.  Get help right away if:   You have thoughts about hurting yourself or others.  If you ever feel like you may hurt yourself or others, or have thoughts about taking your own life, get help right away. You can go to your nearest emergency department or call:   Your local emergency services (911 in the [place]   A suicide crisis helpline, such as the National Suicide Prevention Lifeline at 1-800-273-8255. This is open 24-hours a day.  Summary   If you are living with PTSD, there are ways to help you recover from it and manage your symptoms.   Find supportive environments and people who understand PTSD. Spend time in those places, and maintain contact with those people.   Work with your health  care team to create a management plan that includes counseling, stress management techniques, and healthy lifestyle habits.  This information is not intended to replace advice given to you by your health care provider. Make sure you discuss any questions you have with your health care provider.  Document Released: 01/01/2017 Document Revised: 01/01/2017 Document Reviewed: 01/01/2017  [employer]vier Interactive Patient Education  2019 [employer]
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
## 2019-02-23 ENCOUNTER — Encounter: Admit: 2019-02-23 | Discharge: 2019-02-24 | Payer: MEDICARE

## 2019-02-23 DIAGNOSIS — M4722 Other spondylosis with radiculopathy, cervical region: Secondary | ICD-10-CM

## 2019-02-23 DIAGNOSIS — M4802 Spinal stenosis, cervical region: Principal | ICD-10-CM

## 2019-02-23 NOTE — Unmapped (Addendum)
??   Your diagnosis: The primary encounter diagnosis was Spinal stenosis in cervical region. A diagnosis of Cervical spondylosis with radiculopathy was also pertinent to this visit.    ?? Recommendations/Plan  ?? Medications: Take over-the-counter medications as needed and as tolerated  ?? Take medications as precribed by your Medical Team  ?? Monitor your symptoms. Be mindful of the warning symptoms we discussed.  ?? Imaging studies: none  ?? Labs: None  Return for Next scheduled follow up with Dr. Jaynie Collins (in clinic).    ?? Avoid nicotine/tobacco  ?? Minimize alcohol intake  ?? Maintain a healthy weight    ?? Stay physically active and exercise regularly as tolerated ([email or web address])  ?? Maintain good sleeping habits    ?? We want to improve, and you can help.  You may receive a survey asking you about your visit.  Please take a few minutes to complete the survey.  We will use your feedback to make improvements.    ?? Contact our team electronically via MyChart message to [facility] Clinical Staff.    ?? Contact our team at [phone] or [phone] with any questions/concerns.

## 2019-02-25 MED FILL — FLUTICASONE PROPIONATE 50 MCG/ACTUATION NASAL SPRAY,SUSPENSION: NASAL | 30 days supply | Qty: 16 | Fill #4

## 2019-02-25 MED FILL — FLUTICASONE PROPIONATE 50 MCG/ACTUATION NASAL SPRAY,SUSPENSION: 30 days supply | Qty: 16 | Fill #4 | Status: AC

## 2019-02-25 MED FILL — TIZANIDINE 4 MG TABLET: 30 days supply | Qty: 90 | Fill #0 | Status: AC

## 2019-03-10 ENCOUNTER — Encounter: Admit: 2019-03-10 | Discharge: 2019-03-11 | Payer: MEDICARE

## 2019-03-10 DIAGNOSIS — Z1231 Encounter for screening mammogram for malignant neoplasm of breast: Principal | ICD-10-CM

## 2019-03-14 ENCOUNTER — Encounter
Admit: 2019-03-14 | Discharge: 2019-03-15 | Payer: MEDICARE | Attending: Orthopaedic Surgery | Primary: Orthopaedic Surgery

## 2019-03-14 DIAGNOSIS — M4802 Spinal stenosis, cervical region: Principal | ICD-10-CM

## 2019-03-14 MED ORDER — LIDOCAINE 5 % TOPICAL PATCH
MEDICATED_PATCH | TRANSDERMAL | 2 refills | 30 days | Status: CP
Start: 2019-03-14 — End: 2019-06-12
  Filled 2019-04-08: qty 30, 30d supply, fill #0

## 2019-03-14 NOTE — Unmapped (Signed)
Your diagnosis: Cervical spondylosis (arthritis) and Cervical stenosis     Recommendations/Plan  ?? Medications: Take over-the-counter medications as needed and as tolerated  ?? If you don't have liver disease, the safest medication for pain is acetaminophen (Tylenol).  Take 1000mg  (2 extra strength tabs) at a time for pain relief.  Do not take more than 3000mg  per day.  ?? Try Salonpas Lidocaine 4% patches (https://www.cook.com/)  ?? Activity: Activities as tolerated.  ?? Conservative Care: Let's continue to monitor your symptoms.  ?? We referred you to Pain Management. (Pain Clinic - [phone])  ?? To prevent falls, remove small loose rugs, keep hallway lights on, and install grab bars.  ?? Call [place] Quitline (1-800-QUIT-NOW) and/or talk to PCP to get help quitting smoking.  ?? Surgical Care: Let's continue to reserve surgical care as a last resort.  ?? Imaging studies: None  ?? Follow-up: In-Person Return Visit. in 3 months    Contact our team electronically via MyChart message to [facility] Clinical Staff.    Contact our team at [phone] or [phone] with any questions/concerns.
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
## 2019-03-15 NOTE — Unmapped (Signed)
I, Nemiah Commander, MD, saw and evaluated the patient, participating in the key elements of the service.  I discussed the findings, assessment and plan with the resident and agree with resident???s findings and plan as documented in the resident's note.

## 2019-03-21 MED ORDER — FLUTICASONE PROPIONATE 50 MCG/ACTUATION NASAL SPRAY,SUSPENSION
Freq: Every day | NASAL | 4 refills | 30 days | Status: CP
Start: 2019-03-21 — End: 2020-03-20
  Filled 2019-03-23: qty 16, 30d supply, fill #0

## 2019-03-21 NOTE — Unmapped (Signed)
The [facility] has made a third and final attempt to reach this patient to refill the following medication:Methotrexate.      We have left voicemails on the following phone numbers: [phone].    Dates contacted: 6/24, 7/1, 7/6  Last scheduled delivery: 6/3    The patient may be at risk of non-compliance with this medication. The patient should call the [facility] at [phone] (option 4) to refill medication.    [name]   [facility] Specialty Technician

## 2019-03-21 NOTE — Unmapped (Signed)
[facility] Refill Coordination Note    Specialty Medication(s) to be Shipped:   Inflammatory Disorders: methotrexate 25 mg/mL     Other medication(s) to be shipped: flonase     Michele Miller, DOB: 1960/11/29  Phone: [phone] (home)       All above HIPAA information was verified with patient.     Completed refill call assessment today to schedule patient's medication shipment from the [facility] [phone]).       Specialty medication(s) and dose(s) confirmed: Regimen is correct and unchanged.   Changes to medications: Michele Miller reports no changes at this time.  Changes to insurance: No  Questions for the pharmacist: No    Confirmed patient received Welcome Packet with first shipment. The patient will receive a drug information handout for each medication shipped and additional FDA Medication Guides as required.       DISEASE/MEDICATION-SPECIFIC INFORMATION        For patients on injectable medications: Patient currently has 0 doses left.  Next injection is scheduled for 03/23/2019.    SPECIALTY MEDICATION ADHERENCE     Medication Adherence    Specialty Medication:  methotrexate 25 mg/mL   Patient is on additional specialty medications:  No  Patient is on more than two specialty medications:  No  Any gaps in refill history greater than 2 weeks in the last 3 months:  no  Demonstrates understanding of importance of adherence:  yes  Informant:  patient  Confirmed plan for next specialty medication refill:  delivery by pharmacy          Refill Coordination    Has the Patients' Contact Information Changed:  No  Is the Shipping Address Different:  No           methotrexate 25 mg/mL   : 0 days of medicine on hand       SHIPPING     Shipping address confirmed in Epic.     Delivery Scheduled: Yes, Expected medication delivery date: 03/23/2019.     Medication will be delivered via Same Day Courier to the temporary address in Epic WAM.    [name]   [facility] Specialty Technician

## 2019-03-22 ENCOUNTER — Ambulatory Visit (INDEPENDENT_AMBULATORY_CARE_PROVIDER_SITE_OTHER): Payer: Medicaid Other | Admitting: Nurse Practitioner

## 2019-03-22 ENCOUNTER — Other Ambulatory Visit: Payer: Self-pay

## 2019-03-22 ENCOUNTER — Encounter: Payer: Self-pay | Admitting: Nurse Practitioner

## 2019-03-22 VITALS — BP 114/80 | HR 76 | Temp 98.6°F | Wt 134.8 lb

## 2019-03-22 DIAGNOSIS — L4050 Arthropathic psoriasis, unspecified: Secondary | ICD-10-CM | POA: Insufficient documentation

## 2019-03-22 DIAGNOSIS — F332 Major depressive disorder, recurrent severe without psychotic features: Secondary | ICD-10-CM

## 2019-03-22 DIAGNOSIS — J449 Chronic obstructive pulmonary disease, unspecified: Secondary | ICD-10-CM

## 2019-03-22 DIAGNOSIS — F4310 Post-traumatic stress disorder, unspecified: Secondary | ICD-10-CM

## 2019-03-22 DIAGNOSIS — K0889 Other specified disorders of teeth and supporting structures: Secondary | ICD-10-CM | POA: Diagnosis not present

## 2019-03-22 DIAGNOSIS — M4302 Spondylolysis, cervical region: Secondary | ICD-10-CM | POA: Insufficient documentation

## 2019-03-22 MED ORDER — MECLIZINE HCL 12.5 MG PO TABS: 12.5000 mg | ORAL_TABLET | Freq: Three times a day (TID) | ORAL | 0 refills | Status: DC | PRN

## 2019-03-22 MED ORDER — AMOXICILLIN-POT CLAVULANATE 875-125 MG PO TABS: 1.0000 | ORAL_TABLET | Freq: Two times a day (BID) | ORAL | 0 refills | Status: AC

## 2019-03-22 MED ORDER — STIOLTO RESPIMAT 2.5-2.5 MCG/ACT IN AERS: 2.0000 | INHALATION_SPRAY | Freq: Every day | RESPIRATORY_TRACT | 2 refills | Status: DC

## 2019-03-22 NOTE — Assessment & Plan Note (Addendum)
Chronic, ongoing.  Continue current medication regimen and collaboration with psychiatry.  If unable to get therapy through current place, then will place referral for local therapist.  Denies SI/HI.

## 2019-03-22 NOTE — Assessment & Plan Note (Signed)
Chronic, ongoing.  Continue current medication regimen and collaboration with psychiatry.
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
## 2019-03-22 NOTE — Patient Instructions (Signed)
COPD and Physical Activity °Chronic obstructive pulmonary disease (COPD) is a long-term (chronic) condition that affects the lungs. COPD is a general term that can be used to describe many different lung problems that cause lung swelling (inflammation) and limit airflow, including chronic bronchitis and emphysema. °The main symptom of COPD is shortness of breath, which makes it harder to do even simple tasks. This can also make it harder to exercise and be active. Talk with your health care provider about treatments to help you breathe better and actions you can take to prevent breathing problems during physical activity. °What are the benefits of exercising with COPD? °Exercising regularly is an important part of a healthy lifestyle. You can still exercise and do physical activities even though you have COPD. Exercise and physical activity improve your shortness of breath by increasing blood flow (circulation). This causes your heart to pump more oxygen through your body. Moderate exercise can improve your: °· Oxygen use. °· Energy level. °· Shortness of breath. °· Strength in your breathing muscles. °· Heart health. °· Sleep. °· Self-esteem and feelings of self-worth. °· Depression, stress, and anxiety levels. °Exercise can benefit everyone with COPD. The severity of your disease may affect how hard you can exercise, especially at first, but everyone can benefit. Talk with your health care provider about how much exercise is safe for you, and which activities and exercises are safe for you. °What actions can I take to prevent breathing problems during physical activity? °· Sign up for a pulmonary rehabilitation program. This type of program may include: °? Education about lung diseases. °? Exercise classes that teach you how to exercise and be more active while improving your breathing. This usually involves: °§ Exercise using your lower extremities, such as a stationary bicycle. °§ About 30 minutes of exercise, 2  to 5 times per week, for 6 to 12 weeks °§ Strength training, such as push ups or leg lifts. °? Nutrition education. °? Group classes in which you can talk with others who also have COPD and learn ways to manage stress. °· If you use an oxygen tank, you should use it while you exercise. Work with your health care provider to adjust your oxygen for your physical activity. Your resting flow rate is different from your flow rate during physical activity. °· While you are exercising: °? Take slow breaths. °? Pace yourself and do not try to go too fast. °? Purse your lips while breathing out. Pursing your lips is similar to a kissing or whistling position. °? If doing exercise that uses a quick burst of effort, such as weight lifting: °§ Breathe in before starting the exercise. °§ Breathe out during the hardest part of the exercise (such as raising the weights). °Where to find support °You can find support for exercising with COPD from: °· Your health care provider. °· A pulmonary rehabilitation program. °· Your local health department or community health programs. °· Support groups, online or in-person. Your health care provider may be able to recommend support groups. °Where to find more information °You can find more information about exercising with COPD from: °· American Lung Association: lung.org. °· COPD Foundation: copdfoundation.org. °Contact a health care provider if: °· Your symptoms get worse. °· You have chest pain. °· You have nausea. °· You have a fever. °· You have trouble talking or catching your breath. °· You want to start a new exercise program or a new activity. °Summary °· COPD is a general term that can   be used to describe many different lung problems that cause lung swelling (inflammation) and limit airflow. This includes chronic bronchitis and emphysema. °· Exercise and physical activity improve your shortness of breath by increasing blood flow (circulation). This causes your heart to provide more  oxygen to your body. °· Contact your health care provider before starting any exercise program or new activity. Ask your health care provider what exercises and activities are safe for you. °This information is not intended to replace advice given to you by your health care provider. Make sure you discuss any questions you have with your health care provider. °Document Released: 09/24/2017 Document Revised: 12/22/2018 Document Reviewed: 09/24/2017 °Elsevier Patient Education © 2020 [employer] ° °

## 2019-03-22 NOTE — Assessment & Plan Note (Signed)
Chronic, ongoing.  Spirometry noting moderate airway obstruction, FEV1 pre 58% and post 54%.  Would benefit from LABA/LAMA combo, change from Spiriva to [employer].  Educated on medication and use.  Continue Albuterol as needed only, recommend only use for SOB/wheezing.  Return in 4 weeks for follow-up.

## 2019-03-22 NOTE — Assessment & Plan Note (Signed)
Suspect dizziness from toothache.  Referal to dental.  Scripts for Augmentin and Meclizine sent.  She reports taking both in past without issue.  Recommend Orajel at home as needed.  Return for worsening or continued issues.

## 2019-03-23 MED ORDER — ALBUTEROL SULFATE (2.5 MG/3ML) 0.083% IN NEBU
2.5000 mg | INHALATION_SOLUTION | Freq: Once | RESPIRATORY_TRACT | Status: AC
Start: 2019-03-23 — End: 2019-03-22
  Administered 2019-03-22: 2.5 mg via RESPIRATORY_TRACT

## 2019-03-23 MED FILL — METHOTREXATE SODIUM 25 MG/ML INJECTION SOLUTION: 28 days supply | Qty: 4 | Fill #1 | Status: AC

## 2019-03-23 MED FILL — ALBUTEROL SULFATE HFA 90 MCG/ACTUATION AEROSOL INHALER: RESPIRATORY_TRACT | 25 days supply | Qty: 18 | Fill #1

## 2019-03-23 MED FILL — ALBUTEROL SULFATE HFA 90 MCG/ACTUATION AEROSOL INHALER: 25 days supply | Qty: 18 | Fill #1 | Status: AC

## 2019-03-23 MED FILL — SPIRIVA RESPIMAT 1.25 MCG/ACTUATION SOLUTION FOR INHALATION: 30 days supply | Qty: 4 | Fill #3 | Status: AC

## 2019-03-23 MED FILL — FLUTICASONE PROPIONATE 50 MCG/ACTUATION NASAL SPRAY,SUSPENSION: 30 days supply | Qty: 16 | Fill #0 | Status: AC

## 2019-03-23 MED FILL — SPIRIVA RESPIMAT 1.25 MCG/ACTUATION SOLUTION FOR INHALATION: RESPIRATORY_TRACT | 30 days supply | Qty: 4 | Fill #3

## 2019-03-23 MED FILL — METHOTREXATE SODIUM 25 MG/ML INJECTION SOLUTION: SUBCUTANEOUS | 28 days supply | Qty: 4 | Fill #1

## 2019-03-23 NOTE — Addendum Note (Signed)
Addended by: Georgina Peer on: 03/23/2019 08:54 AM   Modules accepted: Orders

## 2019-04-08 MED ORDER — DULOXETINE 60 MG CAPSULE,DELAYED RELEASE
ORAL_CAPSULE | Freq: Every day | ORAL | 3 refills | 30 days | Status: CP
Start: 2019-04-08 — End: 2020-04-07
  Filled 2019-04-12: qty 60, 30d supply, fill #0

## 2019-04-08 MED ORDER — BUPROPION HCL SR 150 MG TABLET,12 HR SUSTAINED-RELEASE
ORAL_TABLET | Freq: Two times a day (BID) | ORAL | 2 refills | 30.00000 days | Status: CP
Start: 2019-04-08 — End: 2020-04-07
  Filled 2019-04-12: qty 60, 30d supply, fill #0

## 2019-04-08 MED FILL — LIDOCAINE 5 % TOPICAL PATCH: 30 days supply | Qty: 30 | Fill #0 | Status: AC

## 2019-04-08 MED FILL — TRAZODONE 50 MG TABLET: 90 days supply | Qty: 180 | Fill #1 | Status: AC

## 2019-04-08 MED FILL — TRAZODONE 50 MG TABLET: ORAL | 90 days supply | Qty: 180 | Fill #1

## 2019-04-12 MED FILL — CYMBALTA 60 MG CAPSULE,DELAYED RELEASE: 30 days supply | Qty: 60 | Fill #0 | Status: AC

## 2019-04-12 MED FILL — BUPROPION HCL SR 150 MG TABLET,12 HR SUSTAINED-RELEASE: 30 days supply | Qty: 60 | Fill #0 | Status: AC

## 2019-04-15 ENCOUNTER — Ambulatory Visit (INDEPENDENT_AMBULATORY_CARE_PROVIDER_SITE_OTHER): Payer: Medicaid Other | Admitting: Nurse Practitioner

## 2019-04-15 ENCOUNTER — Encounter: Payer: Self-pay | Admitting: Nurse Practitioner

## 2019-04-15 ENCOUNTER — Other Ambulatory Visit: Payer: Self-pay

## 2019-04-15 DIAGNOSIS — J0100 Acute maxillary sinusitis, unspecified: Secondary | ICD-10-CM | POA: Insufficient documentation

## 2019-04-15 DIAGNOSIS — J410 Simple chronic bronchitis: Secondary | ICD-10-CM

## 2019-04-15 DIAGNOSIS — Z1211 Encounter for screening for malignant neoplasm of colon: Secondary | ICD-10-CM | POA: Diagnosis not present

## 2019-04-15 DIAGNOSIS — F17219 Nicotine dependence, cigarettes, with unspecified nicotine-induced disorders: Secondary | ICD-10-CM

## 2019-04-15 MED ORDER — DOXYCYCLINE HYCLATE 100 MG PO TABS: 100.0000 mg | ORAL_TABLET | Freq: Two times a day (BID) | ORAL | 0 refills | Status: DC

## 2019-04-15 NOTE — Progress Notes (Signed)
There were no vitals taken for this visit.   Subjective:    Patient ID: Emily Johnston, female    DOB: 12/31/1960, 58 y.o.   MRN: [id number]  HPI: Emily Johnston is a 58 y.o. female  Chief Complaint  Patient presents with   COPD    4 week f/up   URI    pt states she has had a cough, congestion, headache, and sinus pressure     This visit was completed via telephone due to the restrictions of the COVID-19 pandemic. All issues as above were discussed and addressed but no physical exam was performed. If it was felt that the patient should be evaluated in the office, they were directed there. The patient verbally consented to this visit. Patient was unable to complete an audio/visual visit due to Technical difficulties,Lack of internet. Due to the catastrophic nature of the COVID-19 pandemic, this visit was done through audio contact only.  Location of the patient: home  Location of the provider: home  Those involved with this call:   Provider: Marnee Guarneri, DNP  CMA: Yvonna Alanis, [place] Desk/Registration: Jill Side   Time spent on call: 15 minutes on the phone discussing health concerns. 10 minutes total spent in review of patient's record and preparation of their chart.   I verified patient identity using two factors (patient name and date of birth). Patient consents verbally to being seen via telemedicine visit today.    COPD Has been smoking 1 PPD, has been smoking since age 69.  Recently changed to [employer] which she feels has "helped my symptoms a lot". COPD status: stable Satisfied with current treatment?: yes Oxygen use: no Dyspnea frequency: occasional, but has improved with new inhaler regimen Cough frequency: occasional, but has improved with new inhaler regimen Rescue inhaler frequency:  1-3 times a week Limitation of activity: no Productive cough: currently Last Spirometry: 03/22/2019 Pneumovax: Up to Date Influenza: Up to  Date   UPPER RESPIRATORY TRACT INFECTION Has been present all week, due to cough/ear pain and possible low grade fever.  Has not been around anyone with Covid she knows of and no recent travel.  Refuses Covid testing, states "I have not been around anyone and do not want it done".  Have recommended it to patient and discussed that if symptoms do not improve then testing would be beneficial.  Recommended maintaining a quarantine at home until symptoms improve. Fever: possible, has not taken temperature but has felt warm Cough: yes Shortness of breath: no Wheezing: no Chest pain: no Chest tightness: no Chest congestion: no Nasal congestion: yes Runny nose: no Post nasal drip: no Sneezing: no Sore throat: yes Swollen glands: no Sinus pressure: yes Headache: yes Face pain: no Toothache: no Ear pain: yes bilateral Ear pressure: yes bilateral Eyes red/itching:no Eye drainage/crusting: no  Vomiting: no Rash: no Fatigue: yes Sick contacts: no Strep contacts: no  Context: fluctuating Recurrent sinusitis: no Relief with OTC cold/cough medications: no  Treatments attempted: cold/sinus and Tylenol   COLON CA SCREENING: Due for colonoscopy and would like referral to GI for this.  She is also agreeable to a pap smear in 6 weeks and would like to further discuss low dose lung CT scan for screening due to her lengthy history of smoking.  Relevant past medical, surgical, family and social history reviewed and updated as indicated. Interim medical history since our last visit reviewed. Allergies and medications reviewed and updated.  Review of Systems  Constitutional: Positive  for fatigue and fever (subjective only, has not taken temperature). Negative for activity change, appetite change, chills and diaphoresis.  HENT: Positive for congestion, sinus pressure, sinus pain and sore throat. Negative for ear discharge, ear pain, facial swelling, postnasal drip, rhinorrhea, sneezing and voice  change.   Eyes: Negative for pain and visual disturbance.  Respiratory: Positive for cough. Negative for chest tightness, shortness of breath and wheezing.   Cardiovascular: Negative for chest pain, palpitations and leg swelling.  Gastrointestinal: Negative for abdominal distention, abdominal pain, constipation, diarrhea, nausea and vomiting.  Endocrine: Negative.   Musculoskeletal: Negative for myalgias.  Neurological: Negative for dizziness, numbness and headaches.  Psychiatric/Behavioral: Negative.     Per HPI unless specifically indicated above     Objective:    There were no vitals taken for this visit.  Wt Readings from Last 3 Encounters:  03/22/19 134 lb 12.8 oz (61.1 kg)  01/11/18 140 lb (63.5 kg)  08/18/17 135 lb (61.2 kg)    Physical Exam   Unable to perform due to telephone visit only, she does report maxillary sinus tenderness on self palpation.  Voice slightly hoarse noted on telephone call, no SOB noted with talking and no cough presented during conversation.  Results for orders placed or performed during the hospital encounter of 01/11/18  Urine Culture   Specimen: Urine, Random  Result Value Ref Range   Specimen Description      URINE, RANDOM Performed at [facility], [address] [place], [place] [address]    Special Requests      NONE Performed at [facility], [address] [place], [place] [address]    Culture      NO GROWTH Performed at [facility], [address] [place], [place] [address]    Report Status 01/13/2018 FINAL   Comprehensive metabolic panel  Result Value Ref Range   Sodium 137 135 - 145 mmol/L   Potassium 3.8 3.5 - 5.1 mmol/L   Chloride 103 101 - 111 mmol/L   CO2 23 22 - 32 mmol/L   Glucose, Bld 107 (H) 65 - 99 mg/dL   BUN 14 6 - 20 mg/dL   Creatinine, Ser 0.78 0.44 - 1.00 mg/dL   Calcium 9.2 8.9 - 10.3 mg/dL   Total Protein 7.5 6.5 - 8.1 g/dL   Albumin 4.2 3.5 - 5.0 g/dL   AST 81 (H) 15 - 41  U/L   ALT 99 (H) 14 - 54 U/L   Alkaline Phosphatase 88 38 - 126 U/L   Total Bilirubin 0.3 0.3 - 1.2 mg/dL   GFR calc non Af Amer >60 >60 mL/min   GFR calc Af Amer >60 >60 mL/min   Anion gap 11 5 - 15  Ethanol  Result Value Ref Range   Alcohol, Ethyl (B) <18 <56 mg/dL  Salicylate level  Result Value Ref Range   Salicylate Lvl <3.1 2.8 - 30.0 mg/dL  Acetaminophen level  Result Value Ref Range   Acetaminophen (Tylenol), Serum <10 (L) 10 - 30 ug/mL  cbc  Result Value Ref Range   WBC 8.9 3.6 - 11.0 K/uL   RBC 4.04 3.80 - 5.20 MIL/uL   Hemoglobin 13.7 12.0 - 16.0 g/dL   HCT 40.1 35.0 - 47.0 %   MCV 99.3 80.0 - 100.0 fL   MCH 33.9 26.0 - 34.0 pg   MCHC 34.1 32.0 - 36.0 g/dL   RDW 15.3 (H) 11.5 - 14.5 %   Platelets 283 150 -  440 K/uL  Urine Drug Screen, Qualitative  Result Value Ref Range   Tricyclic, Ur Screen NONE DETECTED NONE DETECTED   Amphetamines, Ur Screen NONE DETECTED NONE DETECTED   MDMA (Ecstasy)Ur Screen NONE DETECTED NONE DETECTED   Cocaine Metabolite,Ur Wing NONE DETECTED NONE DETECTED   Opiate, Ur Screen NONE DETECTED NONE DETECTED   Phencyclidine (PCP) Ur S NONE DETECTED NONE DETECTED   Cannabinoid 50 Ng, Ur [place] POSITIVE (A) NONE DETECTED   Barbiturates, Ur Screen NONE DETECTED NONE DETECTED   Benzodiazepine, Ur Scrn NONE DETECTED NONE DETECTED   Methadone Scn, Ur NONE DETECTED NONE DETECTED  Urinalysis, Complete w Microscopic  Result Value Ref Range   Color, Urine COLORLESS (A) YELLOW   APPearance CLEAR (A) CLEAR   Specific Gravity, Urine 1.002 (L) 1.005 - 1.030   pH 6.0 5.0 - 8.0   Glucose, UA NEGATIVE NEGATIVE mg/dL   Hgb urine dipstick MODERATE (A) NEGATIVE   Bilirubin Urine NEGATIVE NEGATIVE   Ketones, ur NEGATIVE NEGATIVE mg/dL   Protein, ur NEGATIVE NEGATIVE mg/dL   Nitrite NEGATIVE NEGATIVE   Leukocytes, UA NEGATIVE NEGATIVE   WBC, UA 0-5 0 - 5 WBC/hpf   Bacteria, UA NONE SEEN NONE SEEN   Squamous Epithelial / LPF NONE SEEN 0 - 5      Assessment  & Plan:   Problem List Items Addressed This Visit      Respiratory   COPD (chronic obstructive pulmonary disease) (HCC) - Primary    Chronic, ongoing with improvement in symptoms reported with change to Stiolto.  Continue current medication regimen.  Recommend smoking cessation and obtaining low dose CT scan for CA screening.        Acute maxillary sinusitis    Acute x one week.  Refuses Covid testing, have recommended this.  Have recommended self quarantine at home until symptoms improve.  Script for Doxycycline sent.  Recommend continued use of Flonase daily and adding daily Claritin or Allegra for symptoms relief.  Increase hydration and rest.  Return for worsening or continued symptoms.      Relevant Medications   doxycycline (VIBRA-TABS) 100 MG tablet     Nervous and Auditory   Nicotine dependence, cigarettes, w unsp disorders    I have recommended complete cessation of tobacco use. I have discussed various options available for assistance with tobacco cessation including over the counter methods (Nicotine gum, patch and lozenges). We also discussed prescription options (Chantix, Nicotine Inhaler / Nasal Spray). The patient is not interested in pursuing any prescription tobacco cessation options at this time.        Other Visit Diagnoses    Colon cancer screening       Relevant Orders   Ambulatory referral to Gastroenterology      I discussed the assessment and treatment plan with the patient. The patient was provided an opportunity to ask questions and all were answered. The patient agreed with the plan and demonstrated an understanding of the instructions.   The patient was advised to call back or seek an in-person evaluation if the symptoms worsen or if the condition fails to improve as anticipated.   I provided 15 minutes of time during this encounter.   Follow up plan: Return in about 6 weeks (around 05/27/2019) for Preventative (pap smear and labs) + mood follow-up.

## 2019-04-15 NOTE — Assessment & Plan Note (Signed)
I have recommended complete cessation of tobacco use. I have discussed various options available for assistance with tobacco cessation including over the counter methods (Nicotine gum, patch and lozenges). We also discussed prescription options (Chantix, Nicotine Inhaler / Nasal Spray). The patient is not interested in pursuing any prescription tobacco cessation options at this time.  

## 2019-04-15 NOTE — Patient Instructions (Signed)
COPD and Physical Activity °Chronic obstructive pulmonary disease (COPD) is a long-term (chronic) condition that affects the lungs. COPD is a general term that can be used to describe many different lung problems that cause lung swelling (inflammation) and limit airflow, including chronic bronchitis and emphysema. °The main symptom of COPD is shortness of breath, which makes it harder to do even simple tasks. This can also make it harder to exercise and be active. Talk with your health care provider about treatments to help you breathe better and actions you can take to prevent breathing problems during physical activity. °What are the benefits of exercising with COPD? °Exercising regularly is an important part of a healthy lifestyle. You can still exercise and do physical activities even though you have COPD. Exercise and physical activity improve your shortness of breath by increasing blood flow (circulation). This causes your heart to pump more oxygen through your body. Moderate exercise can improve your: °· Oxygen use. °· Energy level. °· Shortness of breath. °· Strength in your breathing muscles. °· Heart health. °· Sleep. °· Self-esteem and feelings of self-worth. °· Depression, stress, and anxiety levels. °Exercise can benefit everyone with COPD. The severity of your disease may affect how hard you can exercise, especially at first, but everyone can benefit. Talk with your health care provider about how much exercise is safe for you, and which activities and exercises are safe for you. °What actions can I take to prevent breathing problems during physical activity? °· Sign up for a pulmonary rehabilitation program. This type of program may include: °? Education about lung diseases. °? Exercise classes that teach you how to exercise and be more active while improving your breathing. This usually involves: °§ Exercise using your lower extremities, such as a stationary bicycle. °§ About 30 minutes of exercise, 2  to 5 times per week, for 6 to 12 weeks °§ Strength training, such as push ups or leg lifts. °? Nutrition education. °? Group classes in which you can talk with others who also have COPD and learn ways to manage stress. °· If you use an oxygen tank, you should use it while you exercise. Work with your health care provider to adjust your oxygen for your physical activity. Your resting flow rate is different from your flow rate during physical activity. °· While you are exercising: °? Take slow breaths. °? Pace yourself and do not try to go too fast. °? Purse your lips while breathing out. Pursing your lips is similar to a kissing or whistling position. °? If doing exercise that uses a quick burst of effort, such as weight lifting: °§ Breathe in before starting the exercise. °§ Breathe out during the hardest part of the exercise (such as raising the weights). °Where to find support °You can find support for exercising with COPD from: °· Your health care provider. °· A pulmonary rehabilitation program. °· Your local health department or community health programs. °· Support groups, online or in-person. Your health care provider may be able to recommend support groups. °Where to find more information °You can find more information about exercising with COPD from: °· American Lung Association: lung.org. °· COPD Foundation: copdfoundation.org. °Contact a health care provider if: °· Your symptoms get worse. °· You have chest pain. °· You have nausea. °· You have a fever. °· You have trouble talking or catching your breath. °· You want to start a new exercise program or a new activity. °Summary °· COPD is a general term that can   be used to describe many different lung problems that cause lung swelling (inflammation) and limit airflow. This includes chronic bronchitis and emphysema. °· Exercise and physical activity improve your shortness of breath by increasing blood flow (circulation). This causes your heart to provide more  oxygen to your body. °· Contact your health care provider before starting any exercise program or new activity. Ask your health care provider what exercises and activities are safe for you. °This information is not intended to replace advice given to you by your health care provider. Make sure you discuss any questions you have with your health care provider. °Document Released: 09/24/2017 Document Revised: 12/22/2018 Document Reviewed: 09/24/2017 °Elsevier Patient Education © 2020 [employer] ° °

## 2019-04-15 NOTE — Assessment & Plan Note (Signed)
Acute x one week.  Refuses Covid testing, have recommended this.  Have recommended self quarantine at home until symptoms improve.  Script for Doxycycline sent.  Recommend continued use of Flonase daily and adding daily Claritin or Allegra for symptoms relief.  Increase hydration and rest.  Return for worsening or continued symptoms.

## 2019-04-15 NOTE — Assessment & Plan Note (Signed)
Chronic, ongoing with improvement in symptoms reported with change to Stiolto.  Continue current medication regimen.  Recommend smoking cessation and obtaining low dose CT scan for CA screening.

## 2019-04-18 ENCOUNTER — Other Ambulatory Visit: Payer: Self-pay

## 2019-04-18 ENCOUNTER — Telehealth: Payer: Self-pay

## 2019-04-18 DIAGNOSIS — Z86010 Personal history of colonic polyps: Secondary | ICD-10-CM

## 2019-04-18 DIAGNOSIS — Z1211 Encounter for screening for malignant neoplasm of colon: Secondary | ICD-10-CM

## 2019-04-18 MED ORDER — CLOBETASOL 0.05 % TOPICAL CREAM
Freq: Two times a day (BID) | TOPICAL | 0 refills | 0.00000 days | Status: CP
Start: 2019-04-18 — End: 2020-04-17
  Filled 2019-04-20: qty 30, 15d supply, fill #0

## 2019-04-18 NOTE — Unmapped (Signed)
[facility] Clinical Assessment & Refill Coordination Note    Michele Miller, DOB: [birth year]  Phone: [phone] (home)     All above HIPAA information was verified with patient.     Specialty Medication(s):   Inflammatory Disorders: methotrexate (injectable)     Current Outpatient Medications   Medication Sig Dispense Refill   ??? albuterol HFA 90 mcg/actuation inhaler Inhale 2 puffs by mouth every six (6) hours as needed for wheezing. 54 g 6   ??? buPROPion (WELLBUTRIN SR) 150 MG 12 hr tablet Take 1 tablet (150 mg total) by mouth Two (2) times a day. 60 tablet 2   ??? cetirizine (ZYRTEC) 10 MG tablet Take 1 tablet (10 mg total) by mouth daily. 90 tablet 3   ??? clobetasoL (TEMOVATE) 0.05 % cream Apply topically Two (2) times a day. 30 g 0   ??? diclofenac sodium (VOLTAREN) 1 % gel Apply 2 grams topically to small joint and 4 grams to large joints as needed up to 4 times daily 300 g PRN   ??? dicyclomine (BENTYL) 10 mg capsule Take 2 capsules (20 mg total) by mouth 4 (four) times a day as needed. 240 capsule 1   ??? DULoxetine (CYMBALTA) 60 MG capsule Take 2 capsules (120 mg total) by mouth daily. 60 capsule 3   ??? empty container (BD SHARPS COLLECTOR) Misc Use as directed. 1 each 3   ??? famotidine (PEPCID) 40 MG tablet Take 1 tablet (40 mg total) by mouth every evening. 360 tablet 0   ??? fluticasone propionate (FLONASE) 50 mcg/actuation nasal spray Use 2 sprays in each nostril daily. 16 g 4   ??? folic acid (FOLVITE) 1 MG tablet Take 1 tablet (1 mg total) by mouth daily. 90 tablet 3   ??? gabapentin (NEURONTIN) 300 MG capsule TAKE 1 CAPSULE BY MOUTH 3 TIMES DAILY 270 capsule 0   ??? lidocaine (LIDODERM) 5 % patch Place 1 patch on the skin daily. Apply to affected area for 12 hours only each day (then remove patch) 30 patch 2   ??? methotrexate 25 mg/mL injection solution Inject 1 mL (25 mg total) under the skin once a week. 4 mL 2   ??? peg-electrolyte soln (GOLYTELY) 420 gram SolR Take as directed 1 Bottle 0   ??? polyethylene glycol (MIRALAX) 17 gram packet Mix the contents of 1 packet (17 g) in 4-8 ounces of water, juice, soda, coffee, or tea and take by mouth daily for 5 days, then use as needed 30 packet 3   ??? syringe with needle 1 mL 27 x 1/2 Syrg Use as directed for weekly Methotrexate injections 50 each 0   ??? tiotropium bromide (SPIRIVA RESPIMAT) 1.25 mcg/actuation Mist Inhale 2 puffs (2.5 mcg) BY MOUTH daily. 4 g 5   ??? tiZANidine (ZANAFLEX) 4 MG tablet Take 1 tablet (4 mg total) by mouth every eight (8) hours as needed. 90 tablet 3   ??? traZODone (DESYREL) 50 MG tablet Take 2 tablets (100 mg total) by mouth nightly. 180 tablet 3     No current facility-administered medications for this visit.         Changes to medications: [name] reports no changes at this time.    No Known Allergies    Changes to allergies: No    SPECIALTY MEDICATION ADHERENCE     methotrexate 25 mg/ml: 7 days of medicine on hand     Medication Adherence    Patient reported X missed doses in the  last month: 0  Specialty Medication: methotrexate 25mg /ml          Specialty medication(s) dose(s) confirmed: Regimen is correct and unchanged.     Are there any concerns with adherence? No    Adherence counseling provided? Not needed    CLINICAL MANAGEMENT AND INTERVENTION      Clinical Benefit Assessment:    Do you feel the medicine is effective or helping your condition? Yes    Clinical Benefit counseling provided? Not needed    Adverse Effects Assessment:    Are you experiencing any side effects? No    Are you experiencing difficulty administering your medicine? No    Quality of Life Assessment:    How many days over the past month did your psoriatic arthritis keep you from your normal activities? For example, brushing your teeth or getting up in the morning. Patient declined to answer    Have you discussed this with your provider? Not needed    Therapy Appropriateness:    Is therapy appropriate? Yes, therapy is appropriate and should be continued    DISEASE/MEDICATION-SPECIFIC INFORMATION      For patients on injectable medications: Patient currently has 1 doses left.  Next injection is scheduled for 04/18/2019.    PATIENT SPECIFIC NEEDS     ? Does the patient have any physical, cognitive, or cultural barriers? No    ? Is the patient high risk? No     ? Does the patient require a Care Management Plan? No     ? Does the patient require physician intervention or other additional services (i.e. nutrition, smoking cessation, social work)? No      SHIPPING     Specialty Medication(s) to be Shipped:   Inflammatory Disorders: methotrexate 25mg /ml    Other medication(s) to be shipped: syringes, clobetasol cream       Changes to insurance: No    Delivery Scheduled: Yes, Expected medication delivery date: 04/21/2019.  However, Rx request for refills was sent to the provider as there are none remaining.     Medication will be delivered via Next Day Courier to the confirmed temporary address in [facility].    The patient will receive a drug information handout for each medication shipped and additional FDA Medication Guides as required.  Verified that patient has previously received a Conservation officer, historic buildings.    All of the patient's questions and concerns have been addressed.    Karene Fry Loy Mccartt   [facility] Specialty Pharmacist

## 2019-04-18 NOTE — Telephone Encounter (Signed)
Gastroenterology Pre-Procedure Review  Request Date: 06/06/19 Requesting Physician: Dr. Vicente Males  PATIENT REVIEW QUESTIONS: The patient responded to the following health history questions as indicated:    1. Are you having any GI issues? yes (some constipation and diarrhea at times) 2. Do you have a personal history of Polyps? yes (5 years ago at [facility]) 3. Do you have a family history of Colon Cancer or Polyps? no 4. Diabetes Mellitus? no 5. Joint replacements in the past 12 months?no 6. Major health problems in the past 3 months?yes (Recently had 8 teeth pulled all on the same day) 7. Any artificial heart valves, MVP, or defibrillator?no    MEDICATIONS & ALLERGIES:    Patient reports the following regarding taking any anticoagulation/antiplatelet therapy:   Plavix, Coumadin, Eliquis, Xarelto, Lovenox, Pradaxa, Brilinta, or Effient? no Aspirin? no  Patient confirms/reports the following medications:  Current Outpatient Medications  Medication Sig Dispense Refill  . albuterol (PROVENTIL HFA;VENTOLIN HFA) 108 (90 Base) MCG/ACT inhaler Inhale 2 puffs into the lungs every 6 (six) hours as needed for wheezing or shortness of breath.    Marland Kitchen buPROPion (WELLBUTRIN XL) 300 MG 24 hr tablet Take 300 mg by mouth daily.    . cetirizine (ZYRTEC) 10 MG tablet Take 10 mg by mouth daily.    . citalopram (CELEXA) 20 MG tablet Take 20 mg by mouth daily.    . clobetasol cream (TEMOVATE) 2.35 % Apply 1 application topically as needed.    . clonazePAM (KLONOPIN) 1 MG tablet Take 1 mg by mouth 2 (two) times daily as needed for anxiety.    Marland Kitchen Dexlansoprazole 30 MG capsule Take 120 mg by mouth daily.    Marland Kitchen doxycycline (VIBRA-TABS) 100 MG tablet Take 1 tablet (100 mg total) by mouth 2 (two) times daily. 20 tablet 0  . DULoxetine (CYMBALTA) 60 MG capsule Take 2 capsules by mouth daily.    . fluticasone (FLONASE) 50 MCG/ACT nasal spray Place 2 sprays into both nostrils daily.    . folic acid (FOLVITE) 1 MG  tablet Take 1 mg by mouth daily.    Marland Kitchen gabapentin (NEURONTIN) 300 MG capsule Take 300 mg by mouth 3 (three) times daily.    . meclizine (ANTIVERT) 12.5 MG tablet Take 1 tablet (12.5 mg total) by mouth 3 (three) times daily as needed for dizziness. 30 tablet 0  . methotrexate 250 MG/10ML injection Inject 25 mg into the vein once a week.    . Multiple Vitamin (MULTIVITAMIN) tablet Take 1 tablet by mouth daily.    . polyethylene glycol (MIRALAX / GLYCOLAX) packet Take 17 g by mouth daily. Mix one tablespoon with 8oz of your favorite juice or water every day until you are having soft formed stools. Then start taking once daily if you didn't have a stool the day before. 30 each 0  . Tiotropium Bromide-Olodaterol (STIOLTO RESPIMAT) 2.5-2.5 MCG/ACT AERS Inhale 2 puffs into the lungs daily. 16 g 2  . tiZANidine (ZANAFLEX) 4 MG tablet Take 4 mg by mouth 3 (three) times daily as needed for muscle spasms.    . traZODone (DESYREL) 50 MG tablet Take 2 tablets by mouth at bedtime.     No current facility-administered medications for this visit.     Patient confirms/reports the following allergies:  No Known Allergies  No orders of the defined types were placed in this encounter.   AUTHORIZATION INFORMATION Primary Insurance: 1D#: Group #:  Secondary Insurance: 1D#: Group #:  SCHEDULE INFORMATION: Date: 06/06/19 Time: Location:[facility]

## 2019-04-20 ENCOUNTER — Ambulatory Visit: Payer: Medicaid Other | Admitting: Nurse Practitioner

## 2019-04-20 MED FILL — CLOBETASOL 0.05 % TOPICAL CREAM: 15 days supply | Qty: 30 | Fill #0 | Status: AC

## 2019-04-20 MED FILL — METHOTREXATE SODIUM 25 MG/ML INJECTION SOLUTION: 28 days supply | Qty: 4 | Fill #2 | Status: AC

## 2019-04-20 MED FILL — METHOTREXATE SODIUM 25 MG/ML INJECTION SOLUTION: SUBCUTANEOUS | 28 days supply | Qty: 4 | Fill #2

## 2019-04-20 MED FILL — BD TUBERCULIN SYRINGE 1 ML 27 X 1/2": SUBCUTANEOUS | 28 days supply | Qty: 4 | Fill #1

## 2019-04-20 MED FILL — BD TUBERCULIN SYRINGE 1 ML 27 X 1/2": 28 days supply | Qty: 4 | Fill #1 | Status: AC

## 2019-04-22 ENCOUNTER — Other Ambulatory Visit: Payer: Self-pay | Admitting: Nurse Practitioner

## 2019-04-22 ENCOUNTER — Telehealth: Payer: Self-pay | Admitting: Nurse Practitioner

## 2019-04-22 MED ORDER — LEVOFLOXACIN 750 MG PO TABS: 750.0000 mg | ORAL_TABLET | Freq: Every day | ORAL | 0 refills | Status: AC

## 2019-04-22 NOTE — Progress Notes (Signed)
Reports medication is not working, will send in Levofloxacin but if no improvement then recommend follow-up with provider.  Due to taking Methotrexate, will avoid Augmentin due to possibility for interaction.

## 2019-04-22 NOTE — Telephone Encounter (Signed)
Copied from [place] [id number]. Topic: General - Other >> Apr 22, 2019  9:23 AM Keene Breath wrote: Reason for CRM: Patient would like a new anti-biotic called in for her.  The azithromycin is not working for.  CB# [phone].

## 2019-04-22 NOTE — Telephone Encounter (Signed)
I have sent in alternate antibiotic, but if no improvement with this then she will need to be seen by provider.  Thank you.

## 2019-04-22 NOTE — Telephone Encounter (Signed)
Called pt to let her know of [name]'s message, no answer, vm not set up unable to leave a message.

## 2019-04-26 NOTE — Unmapped (Signed)
REASON FOR VISIT: f/u psoriatic arthritis.     Identification: Pt self identified using name and date of birth  Patient location: [place]  The limitations of this telemedicine encounter were discussed with patient. Both the patient and myself agreed to this encounter despite these limitations. Benefits of this telemedicine encounter included allowing for continued care of patient and minimizing risk of exposure to COVID-19.     HISTORY: [name] is a 58 y.o. female with hx of psoriatic arthritis. She also has clinical findings suggestive of centralized chronic pain. Was following pain management but has lost f/u due to transportation issues.    Psoriatic arthritis treated with mtx SQ monotherapy. Also on pharmacotherapy for fibromyalgia from PCP.   She has multiple psychiatric comorbidities including PTSD and anxiety. These have been exacerbated by her son's untimely death of her son in 41.     Interim history:   Pt did not present for video visit today.  Called at 830, no answer, unable to LM.   Called at 835, no answer, unable to LM.   Called at 846, no answer, unable to LM.

## 2019-05-04 ENCOUNTER — Telehealth: Payer: Self-pay

## 2019-05-04 NOTE — Telephone Encounter (Signed)
I would recommend increased fluid intake + adding on Sennakot (Colace and Senna medication) one tablet twice a day.  She may also need to try a suppository at home, which may be beneficial.  If worsening or continued issues then recommend she be seen in office or urgent care/ER setting, especially if not improved by tomorrow or Friday.  At baseline she needs to ensure regular fiber in diet, Miralax daily, and increased fluids to help maintain regular pattern.

## 2019-05-04 NOTE — Telephone Encounter (Signed)
Patient notified of [name]'s recommendations and verbalized understanding.

## 2019-05-04 NOTE — Telephone Encounter (Signed)
Called patient and scheduled 6 week f/up. While on the phone, patient states that she has had trouble with her bowels since her son. She states that she has not had a BM since the Friday before last. States she has been taking Miralax everyday with no relief. She states she is hurting pretty bad and wants to know what she should do.

## 2019-05-05 ENCOUNTER — Encounter: Admit: 2019-05-05 | Discharge: 2019-05-06 | Payer: MEDICARE

## 2019-05-05 DIAGNOSIS — M19021 Primary osteoarthritis, right elbow: Secondary | ICD-10-CM

## 2019-05-05 DIAGNOSIS — M797 Fibromyalgia: Secondary | ICD-10-CM

## 2019-05-05 DIAGNOSIS — Z79899 Other long term (current) drug therapy: Secondary | ICD-10-CM

## 2019-05-05 DIAGNOSIS — L4050 Arthropathic psoriasis, unspecified: Principal | ICD-10-CM

## 2019-05-05 DIAGNOSIS — M47816 Spondylosis without myelopathy or radiculopathy, lumbar region: Secondary | ICD-10-CM

## 2019-05-05 DIAGNOSIS — L853 Xerosis cutis: Secondary | ICD-10-CM

## 2019-05-05 MED ORDER — GABAPENTIN 300 MG CAPSULE
ORAL_CAPSULE | Freq: Three times a day (TID) | ORAL | 0 refills | 90 days | Status: CP
Start: 2019-05-05 — End: 2020-05-04
  Filled 2019-05-09: qty 270, 90d supply, fill #0

## 2019-05-05 NOTE — Unmapped (Signed)
I spent 24 minutes on the phone with the patient. I spent an additional 10 minutes on pre- and post-visit activities.     The patient was physically located in [place] or a state in which I am permitted to provide care. The patient and/or parent/guardian understood that s/he may incur co-pays and cost sharing, and agreed to the telemedicine visit. The visit was reasonable and appropriate under the circumstances given the patient's presentation at the time.    The patient and/or parent/guardian has been advised of the potential risks and limitations of this mode of treatment (including, but not limited to, the absence of in-person examination) and has agreed to be treated using telemedicine. The patient's/patient's family's questions regarding telemedicine have been answered.     If the visit was completed in an ambulatory setting, the patient and/or parent/guardian has also been advised to contact their provider???s office for worsening conditions, and seek emergency medical treatment and/or call 911 if the patient deems either necessary.         REASON FOR VISIT: f/u PsA     Identification: Pt self identified using name and date of birth  Patient location: [place]  The limitations of this telemedicine encounter were discussed with patient. Both the patient and myself agreed to this encounter despite these limitations. Benefits of this telemedicine encounter included allowing for continued care of patient and minimizing risk of exposure to COVID-19.     HISTORY: Michele Miller is a 58 y.o. female with hx of psoriatic arthritis. She also has clinical findings suggestive of centralized chronic pain. Was following pain management but has lost f/u due to transportation issues.    Psoriatic arthritis treated with mtx SQ monotherapy. Also on pharmacotherapy for fibromyalgia from PCP.   She has multiple psychiatric comorbidities including PTSD and anxiety. These have been exacerbated by her son's untimely death of her son in 34.     Interim history:   Pt presents via phone call for follow up.   In the interim had MRI showing severe DJD of the Cspine with severe canal and neural foraminal stenosis. She has been evaluated by neurosurgery and is a candidate for surgery.     She is very torn about whether she wants to get neck surgery. Neck doesn't hurt her all the time, but when it does it is severe. Hard to bend her neck. She worries because she falls a lot because of her vertigo.     She states that her elbow kills her, deep pain that wakes her up at night. Discussed getting IA steroid injection, but she states she doesn't need this because it doesn't hurt all the time. She wonders if she could take ibuprofen. Tylenol and voltaren gel do not seem to be helping.     She feels that her fingers are getting so much more inflamed. Stiffness in the fingers. She feels that her psoriatic arthritis is very active.     Also feels that her skin on her fingers is very dry. Doesn't look like psoriasis. Not responding to clobetasol. Very bothersome to her.     Has been moving to a new home. Has been doing more than she should. Her back is hurting a lot more now after lifting heavy items.     Also notes intermittent pain and locking up of the R knee. She is not sure if this is related her recent move. Saw swelling in the b/l knees before, but this has resolved now.  CURRENT MEDICATIONS:  Current Outpatient Medications   Medication Sig Dispense Refill   ??? albuterol HFA 90 mcg/actuation inhaler Inhale 2 puffs by mouth every six (6) hours as needed for wheezing. 54 g 6   ??? buPROPion (WELLBUTRIN SR) 150 MG 12 hr tablet Take 1 tablet (150 mg total) by mouth Two (2) times a day. 60 tablet 2   ??? cetirizine (ZYRTEC) 10 MG tablet Take 1 tablet (10 mg total) by mouth daily. 90 tablet 3   ??? clobetasoL (TEMOVATE) 0.05 % cream Apply topically Two (2) times a day. 30 g 0   ??? diclofenac sodium (VOLTAREN) 1 % gel Apply 2 grams topically to small joint and 4 grams to large joints as needed up to 4 times daily 300 g PRN   ??? dicyclomine (BENTYL) 10 mg capsule Take 2 capsules (20 mg total) by mouth 4 (four) times a day as needed. 240 capsule 1   ??? DULoxetine (CYMBALTA) 60 MG capsule Take 2 capsules (120 mg total) by mouth daily. 60 capsule 3   ??? empty container (BD SHARPS COLLECTOR) Misc Use as directed. 1 each 3   ??? fluticasone propionate (FLONASE) 50 mcg/actuation nasal spray Use 2 sprays in each nostril daily. 16 g 4   ??? folic acid (FOLVITE) 1 MG tablet Take 1 tablet (1 mg total) by mouth daily. 90 tablet 3   ??? gabapentin (NEURONTIN) 300 MG capsule TAKE 1 CAPSULE BY MOUTH 3 TIMES DAILY (Patient not taking: Reported on 05/05/2019) 270 capsule 0   ??? lidocaine (LIDODERM) 5 % patch Place 1 patch on the skin daily. Apply to affected area for 12 hours only each day (then remove patch) 30 patch 2   ??? methotrexate 25 mg/mL injection solution Inject 1 mL (25 mg total) under the skin once a week. 4 mL 2   ??? peg-electrolyte soln (GOLYTELY) 420 gram SolR Take as directed 1 Bottle 0   ??? polyethylene glycol (MIRALAX) 17 gram packet Mix the contents of 1 packet (17 g) in 4-8 ounces of water, juice, soda, coffee, or tea and take by mouth daily for 5 days, then use as needed 30 packet 3   ??? syringe with needle 1 mL 27 x 1/2 Syrg Use as directed for weekly Methotrexate injections 50 each 0   ??? tiotropium bromide (SPIRIVA RESPIMAT) 1.25 mcg/actuation Mist Inhale 2 puffs (2.5 mcg) BY MOUTH daily. 4 g 5   ??? tiZANidine (ZANAFLEX) 4 MG tablet Take 1 tablet (4 mg total) by mouth every eight (8) hours as needed. 90 tablet 3   ??? traZODone (DESYREL) 50 MG tablet Take 2 tablets (100 mg total) by mouth nightly. 180 tablet 3     No current facility-administered medications for this visit.        Past Medical History:   Diagnosis Date   ??? Arthritis    ??? Baker's cyst    ??? COPD (chronic obstructive pulmonary disease) (CMS-HCC)    ??? CTS (carpal tunnel syndrome)    ??? Depression    ??? Headache(784.0)    ??? Joint pain    ??? Psoriasis    ??? PTSD (post-traumatic stress disorder)    ??? Scoliosis         Record Review: Available records were reviewed, including pertinent office visits, labs, and imaging.      REVIEW OF SYSTEMS: Ten system were reviewed and negative except as noted above.    PHYSICAL EXAM:  Patient reported vitals:  There were no vitals filed  for this visit.   General:   Does not sound to be in distress   Lungs:  No wheezing, coughing, or increased respiratory effort noted   Psych:  Appropriate interaction       ASSESSMENT/PLAN:  1. Psoriatic arthritis (CMS-HCC)  Pt concern for psoriatic arthritis activity, though I am still concerned that her persistent pain is related to OA and FMS rather than psoriatic arthritis. Referred to [place] [facility] for diagnostic US of hands to eval for synovitis.   Continue MTX 25 mg SQ qwk, FA 1mg  qd.   - US Extremity Nonvasc Comp (Joint,Muscle,Tendon,Ligament) Bilat; Future    2. DJD spine  Noted in both Cspine and Lspine. She appears to be a candidate for Cspine surgery due to concern about neurologic compromise. She will continue to consider this. Continue f/u with neurosurgery. She will let me know if she decides to pursue surgery.     3. Fibromyalgia  Symptomatic but stable. Continue pharmacotherapy per PCP.     4. Methotrexate, long term, current use  Checking labs below to evaluate for medication toxicity.    - Albumin; Future  - AST; Future  - ALT; Future  - CBC w/ Differential; Future  - Creatinine; Future    5. Dry skin  Dry peeling skin on fingers not responding to steroids. Discussed concerns that this may not be psoriasis. Refer to dermatology for eval.   - Ambulatory referral to Dermatology; Future    6. Primary osteoarthritis of right elbow  May continue OTC ibuprofen prn elbow pain.       HCM:   - PCV13 Status: Did not discuss today  - PPSV 23 Status: 11/03/2013  - Annual Influenza vaccine. Status: 10/25/2018  - Bone health: not on prednisone   - Contraception: Status post hysterectomy  ??    Return appt with myself in 4 mo and with Dr Delton See in 8 mo.

## 2019-05-05 NOTE — Unmapped (Signed)
Lab orders mailed to pt's address on file in Epic.

## 2019-05-05 NOTE — Unmapped (Signed)
-----   Message from Staci Righter, [place] sent at 05/05/2019 11:23 AM EDT -----  Please mail order for labs from today to patient

## 2019-05-06 NOTE — Unmapped (Signed)
Concerns of hand issues itching mostly in left hand appt schedule
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
## 2019-05-09 MED FILL — GABAPENTIN 300 MG CAPSULE: 90 days supply | Qty: 270 | Fill #0 | Status: AC

## 2019-05-11 NOTE — Unmapped (Signed)
[facility] Refill Coordination Note    Specialty Medication(s) to be Shipped:   Inflammatory Disorders: methotrexate (injectable)    Other medication(s) to be shipped: n/a     [name], DOB: 1961/04/08  Phone: [phone] (home)       All above HIPAA information was verified with patient.     Completed refill call assessment today to schedule patient's medication shipment from the [facility] [phone]).       Specialty medication(s) and dose(s) confirmed: Regimen is correct and unchanged.   Changes to medications: [name] reports no changes at this time.  Changes to insurance: No  Questions for the pharmacist: No    Confirmed patient received Welcome Packet with first shipment. The patient will receive a drug information handout for each medication shipped and additional FDA Medication Guides as required.       DISEASE/MEDICATION-SPECIFIC INFORMATION        For patients on injectable medications: Patient currently has 1 doses left.  Next injection is scheduled for 05/13/19.    SPECIALTY MEDICATION ADHERENCE     Medication Adherence    Patient reported X missed doses in the last month: 0  Specialty Medication: methotrexate  Patient is on additional specialty medications: No  Patient is on more than two specialty medications: No  Any gaps in refill history greater than 2 weeks in the last 3 months: no  Demonstrates understanding of importance of adherence: yes  Informant: patient                Methotrexate 25mg /ml: Patient has 7 days of medication on hand       SHIPPING     Shipping address confirmed in Epic.     Delivery Scheduled: Yes, Expected medication delivery date: 05/18/19.  However, Rx request for refills was sent to the provider as there are none remaining.     Medication will be delivered via UPS to the home address in Epic WAM.    [name]   [facility] Specialty Technician

## 2019-05-13 NOTE — Unmapped (Signed)
[name] 's methotrexate shipment will be delayed as a result of on refills on profile.  I have reached out to the patient and communicated the delivery change. We will not reschedule the medication due to denied request and have removed this/these medication(s) from the work request.  We have canceled this work request.

## 2019-06-01 ENCOUNTER — Other Ambulatory Visit (HOSPITAL_COMMUNITY)
Admission: RE | Admit: 2019-06-01 | Discharge: 2019-06-01 | Disposition: A | Discharge status: Home / Self Care | Payer: Medicare Other | Source: Ambulatory Visit | Attending: Nurse Practitioner | Admitting: Nurse Practitioner

## 2019-06-01 ENCOUNTER — Other Ambulatory Visit: Payer: Self-pay

## 2019-06-01 ENCOUNTER — Ambulatory Visit (INDEPENDENT_AMBULATORY_CARE_PROVIDER_SITE_OTHER): Payer: Medicare Other | Admitting: Nurse Practitioner

## 2019-06-01 ENCOUNTER — Encounter: Payer: Self-pay | Admitting: Nurse Practitioner

## 2019-06-01 VITALS — BP 110/75 | HR 66 | Temp 98.2°F | Ht 64.0 in | Wt 136.0 lb

## 2019-06-01 DIAGNOSIS — Z124 Encounter for screening for malignant neoplasm of cervix: Secondary | ICD-10-CM | POA: Diagnosis not present

## 2019-06-01 DIAGNOSIS — F332 Major depressive disorder, recurrent severe without psychotic features: Secondary | ICD-10-CM

## 2019-06-01 DIAGNOSIS — R635 Abnormal weight gain: Secondary | ICD-10-CM | POA: Diagnosis not present

## 2019-06-01 DIAGNOSIS — Z13220 Encounter for screening for lipoid disorders: Secondary | ICD-10-CM

## 2019-06-01 DIAGNOSIS — Z23 Encounter for immunization: Secondary | ICD-10-CM

## 2019-06-01 DIAGNOSIS — Z87891 Personal history of nicotine dependence: Secondary | ICD-10-CM

## 2019-06-01 DIAGNOSIS — J410 Simple chronic bronchitis: Secondary | ICD-10-CM

## 2019-06-01 DIAGNOSIS — F17219 Nicotine dependence, cigarettes, with unspecified nicotine-induced disorders: Secondary | ICD-10-CM

## 2019-06-01 DIAGNOSIS — L4050 Arthropathic psoriasis, unspecified: Secondary | ICD-10-CM

## 2019-06-01 MED ORDER — MECLIZINE HCL 12.5 MG PO TABS: 12.5000 mg | ORAL_TABLET | Freq: Three times a day (TID) | ORAL | 0 refills | Status: DC | PRN

## 2019-06-01 MED FILL — PEG-ELECTROLYTE SOLUTION 420 GRAM ORAL SOLUTION: 30 days supply | Qty: 1 | Fill #0

## 2019-06-01 MED FILL — PEG-ELECTROLYTE SOLUTION 420 GRAM ORAL SOLUTION: 30 days supply | Qty: 1 | Fill #0 | Status: AC

## 2019-06-01 NOTE — Patient Instructions (Signed)
Living With Depression Everyone experiences occasional disappointment, sadness, and loss in their lives. When you are feeling down, blue, or sad for at least 2 weeks in a row, it may mean that you have depression. Depression can affect your thoughts and feelings, relationships, daily activities, and physical health. It is caused by changes in the way your brain functions. If you receive a diagnosis of depression, your health care provider will tell you which type of depression you have and what treatment options are available to you. If you are living with depression, there are ways to help you recover from it and also ways to prevent it from coming back. How to cope with lifestyle changes Coping with stress     Stress is your body's reaction to life changes and events, both good and bad. Stressful situations may include:  Getting married.  The death of a spouse.  Losing a job.  Retiring.  Having a baby. Stress can last just a few hours or it can be ongoing. Stress can play a major role in depression, so it is important to learn both how to cope with stress and how to think about it differently. Talk with your health care provider or a counselor if you would like to learn more about stress reduction. He or she may suggest some stress reduction techniques, such as:  Music therapy. This can include creating music or listening to music. Choose music that you enjoy and that inspires you.  Mindfulness-based meditation. This kind of meditation can be done while sitting or walking. It involves being aware of your normal breaths, rather than trying to control your breathing.  Centering prayer. This is a kind of meditation that involves focusing on a spiritual word or phrase. Choose a word, phrase, or sacred image that is meaningful to you and that brings you peace.  Deep breathing. To do this, expand your stomach and inhale slowly through your nose. Hold your breath for 3-5 seconds, then exhale  slowly, allowing your stomach muscles to relax.  Muscle relaxation. This involves intentionally tensing muscles then relaxing them. Choose a stress reduction technique that fits your lifestyle and personality. Stress reduction techniques take time and practice to develop. Set aside 5-15 minutes a day to do them. Therapists can offer training in these techniques. The training may be covered by some insurance plans. Other things you can do to manage stress include:  Keeping a stress diary. This can help you learn what triggers your stress and ways to control your response.  Understanding what your limits are and saying no to requests or events that lead to a schedule that is too full.  Thinking about how you respond to certain situations. You may not be able to control everything, but you can control how you react.  Adding humor to your life by watching funny films or TV shows.  Making time for activities that help you relax and not feeling guilty about spending your time this way.  Medicines Your health care provider may suggest certain medicines if he or she feels that they will help improve your condition. Avoid using alcohol and other substances that may prevent your medicines from working properly (may interact). It is also important to:  Talk with your pharmacist or health care provider about all the medicines that you take, their possible side effects, and what medicines are safe to take together.  Make it your goal to take part in all treatment decisions (shared decision-making). This includes giving input on   the side effects of medicines. It is best if shared decision-making with your health care provider is part of your total treatment plan. If your health care provider prescribes a medicine, you may not notice the full benefits of it for 4-8 weeks. Most people who are treated for depression need to be on medicine for at least 6-12 months after they feel better. If you are taking  medicines as part of your treatment, do not stop taking medicines without first talking to your health care provider. You may need to have the medicine slowly decreased (tapered) over time to decrease the risk of harmful side effects. Relationships Your health care provider may suggest family therapy along with individual therapy and drug therapy. While there may not be family problems that are causing you to feel depressed, it is still important to make sure your family learns as much as they can about your mental health. Having your family's support can help make your treatment successful. How to recognize changes in your condition Everyone has a different response to treatment for depression. Recovery from major depression happens when you have not had signs of major depression for two months. This may mean that you will start to:  Have more interest in doing activities.  Feel less hopeless than you did 2 months ago.  Have more energy.  Overeat less often, or have better or improving appetite.  Have better concentration. Your health care provider will work with you to decide the next steps in your recovery. It is also important to recognize when your condition is getting worse. Watch for these signs:  Having fatigue or low energy.  Eating too much or too little.  Sleeping too much or too little.  Feeling restless, agitated, or hopeless.  Having trouble concentrating or making decisions.  Having unexplained physical complaints.  Feeling irritable, angry, or aggressive. Get help as soon as you or your family members notice these symptoms coming back. How to get support and help from others How to talk with friends and family members about your condition  Talking to friends and family members about your condition can provide you with one way to get support and guidance. Reach out to trusted friends or family members, explain your symptoms to them, and let them know that you are  working with a health care provider to treat your depression. Financial resources Not all insurance plans cover mental health care, so it is important to check with your insurance carrier. If paying for co-pays or counseling services is a problem, search for a local or county mental health care center. They may be able to offer public mental health care services at low or no cost when you are not able to see a private health care provider. If you are taking medicine for depression, you may be able to get the generic form, which may be less expensive. Some makers of prescription medicines also offer help to patients who cannot afford the medicines they need. Follow these instructions at home:   Get the right amount and quality of sleep.  Cut down on using caffeine, tobacco, alcohol, and other potentially harmful substances.  Try to exercise, such as walking or lifting small weights.  Take over-the-counter and prescription medicines only as told by your health care provider.  Eat a healthy diet that includes plenty of vegetables, fruits, whole grains, low-fat dairy products, and lean protein. Do not eat a lot of foods that are high in solid fats, added sugars, or salt.    Keep all follow-up visits as told by your health care provider. This is important. Contact a health care provider if:  You stop taking your antidepressant medicines, and you have any of these symptoms: ? Nausea. ? Headache. ? Feeling lightheaded. ? Chills and body aches. ? Not being able to sleep (insomnia).  You or your friends and family think your depression is getting worse. Get help right away if:  You have thoughts of hurting yourself or others. If you ever feel like you may hurt yourself or others, or have thoughts about taking your own life, get help right away. You can go to your nearest emergency department or call:  Your local emergency services (911 in the [place]  A suicide crisis helpline, such as the  National Suicide Prevention Lifeline at 1-800-273-8255. This is open 24-hours a day. Summary  If you are living with depression, there are ways to help you recover from it and also ways to prevent it from coming back.  Work with your health care team to create a management plan that includes counseling, stress management techniques, and healthy lifestyle habits. This information is not intended to replace advice given to you by your health care provider. Make sure you discuss any questions you have with your health care provider. Document Released: 08/04/2016 Document Revised: 12/24/2018 Document Reviewed: 08/04/2016 Elsevier Patient Education  2020 [employer].  

## 2019-06-01 NOTE — Assessment & Plan Note (Signed)
Chronic, ongoing.  Continue current medication regimen and collaboration with psychiatry and therapist. Denies SI/HI.

## 2019-06-01 NOTE — Assessment & Plan Note (Signed)
Continue collaboration with rheumatology at [facility].  She plans on contacting them tomorrow about her hands.

## 2019-06-01 NOTE — Assessment & Plan Note (Signed)
I have recommended complete cessation of tobacco use. I have discussed various options available for assistance with tobacco cessation including over the counter methods (Nicotine gum, patch and lozenges). We also discussed prescription options (Chantix, Nicotine Inhaler / Nasal Spray). The patient is not interested in pursuing any prescription tobacco cessation options at this time. CT lung screening ordered.

## 2019-06-01 NOTE — Progress Notes (Signed)
BP 110/75   Pulse 66   Temp 98.2 F (36.8 C) (Oral)   Ht 5\' 4"  (1.626 m)   Wt 136 lb (61.7 kg)   SpO2 94%   BMI 23.34 kg/m    Subjective:    Patient ID: Emily Johnston, female    DOB: 27-Jun-1961, 58 y.o.   MRN: [id number]  HPI: Emily Johnston is a 58 y.o. female  Chief Complaint  Patient presents with  . Depression  . Labs Only  . Other    pap smear   DEPRESSION Currently followed by psychiatry team, [facility] in [place].  Last seen a few weeks ago per patient report.  She also has recently started seeing a therapist, has had one visit and reports this has been "very helpful".  She plans on continuing this.  Continues on Cymbalta, Klonopin, and Celexa.  She does have concerns about some recent weight gain and is requesting to have her thyroid checked. Mood status: stable Satisfied with current treatment?: yes Symptom severity: moderate Duration of current treatment : chronic Side effects: no Medication compliance: good compliance Psychotherapy/counseling: yes current Depressed mood: yes Anxious mood: yes Anhedonia: no Significant weight loss or gain: no Insomnia: yes hard to stay asleep Fatigue: no Feelings of worthlessness or guilt: no Impaired concentration/indecisiveness: yes Suicidal ideations: no Hopelessness: no Crying spells: no Depression screen [facility] 2/9 06/01/2019 03/22/2019 02/22/2019  Decreased Interest 2 3 3   Down, Depressed, Hopeless 3 3 3   PHQ - 2 Score 5 6 6   Altered sleeping 3 3 1   Tired, decreased energy 2 3 3   Change in appetite 2 3 3   Feeling bad or failure about yourself  3 3 3   Trouble concentrating 3 3 3   Moving slowly or fidgety/restless 3 2 1   Suicidal thoughts 0 0 0  PHQ-9 Score 21 23 20   Difficult doing work/chores Somewhat difficult Somewhat difficult Very difficult   GAD 7 : Generalized Anxiety Score 06/01/2019 03/22/2019 02/22/2019  Nervous, Anxious, on Edge 2 3 3   Control/stop worrying 1 3 3   Worry too much  - different things 1 3 3   Trouble relaxing 1 3 3   Restless 1 3 1   Easily annoyed or irritable 0 2 2  Afraid - awful might happen 0 2 1  Total GAD 7 Score 6 19 16   Anxiety Difficulty Somewhat difficult Very difficult Very difficult   PSORIATIC ARTHRITIS: Followed by rheumatology at [facility] and last seen 05/05/19.  She continues on Methotrexate weekly and folic acid.  Does report she is seeing dermatology on Friday for skin assessment, recently (over past two weeks) has had some erythema and cracking on skin on the palms of her hands.  Has been using Clobetasol which was prescribed at [facility] by rheumatology she reports, but this has not been helping and causes hands to burn.  Overall, she reports doing well, except for recent issue with hands and she plans to call providers at [facility] tomorrow to report this issue to them.    SCREENING NEEDS: - Need for pap, last in February 2015 was negative.  She reports history of hysterectomy prior to 2015, but they kept her cervix and ovaries in place.   - Had mammogram 03/10/19 at [facility], which was normal and due again in one year.  - Scheduled for colonoscopy on Monday 06/06/19.    - Has smoked at least a pack a day since her teen years.  She is interested in obtaining lung CT CA screening and would  like this order placed.    Relevant past medical, surgical, family and social history reviewed and updated as indicated. Interim medical history since our last visit reviewed. Allergies and medications reviewed and updated.  Review of Systems  Constitutional: Negative for activity change, appetite change, diaphoresis, fatigue and fever.  Respiratory: Negative for cough, chest tightness and shortness of breath.   Cardiovascular: Negative for chest pain, palpitations and leg swelling.  Gastrointestinal: Negative for abdominal distention, abdominal pain, constipation, diarrhea, nausea and vomiting.  Endocrine: Negative for cold intolerance and heat intolerance.  Neurological:  Negative for dizziness, syncope, weakness, light-headedness, numbness and headaches.  Psychiatric/Behavioral: Negative.     Per HPI unless specifically indicated above     Objective:    BP 110/75   Pulse 66   Temp 98.2 F (36.8 C) (Oral)   Ht 5\' 4"  (1.626 m)   Wt 136 lb (61.7 kg)   SpO2 94%   BMI 23.34 kg/m   Wt Readings from Last 3 Encounters:  06/01/19 136 lb (61.7 kg)  03/22/19 134 lb 12.8 oz (61.1 kg)  01/11/18 140 lb (63.5 kg)    Physical Exam Vitals signs and nursing note reviewed.  Constitutional:      General: She is awake. She is not in acute distress.    Appearance: She is well-developed. She is not ill-appearing.  HENT:     Head: Normocephalic.     Right Ear: Hearing normal.     Left Ear: Hearing normal.  Eyes:     General: Lids are normal.        Right eye: No discharge.        Left eye: No discharge.     Conjunctiva/sclera: Conjunctivae normal.     Pupils: Pupils are equal, round, and reactive to light.  Neck:     Musculoskeletal: Normal range of motion and neck supple.     Thyroid: No thyromegaly.     Vascular: No carotid bruit.  Cardiovascular:     Rate and Rhythm: Normal rate and regular rhythm.     Heart sounds: Normal heart sounds. No murmur. No gallop.   Pulmonary:     Effort: Pulmonary effort is normal. No accessory muscle usage or respiratory distress.     Breath sounds: Normal breath sounds.  Chest:     Breasts:        Right: Normal. No swelling, bleeding, inverted nipple, mass, nipple discharge, skin change or tenderness.        Left: Normal. No swelling, bleeding, inverted nipple, mass, nipple discharge, skin change or tenderness.  Abdominal:     General: Bowel sounds are normal.     Palpations: Abdomen is soft. There is no hepatomegaly or splenomegaly.     Hernia: There is no hernia in the left inguinal area or right inguinal area.  Genitourinary:    Exam position: Lithotomy position.     Labia:        Right: No rash or tenderness.         Left: No rash or tenderness.      Vagina: Normal.     Cervix: Normal.     Uterus: Normal.      Adnexa: Right adnexa normal and left adnexa normal.  Musculoskeletal:     Right lower leg: No edema.     Left lower leg: No edema.  Lymphadenopathy:     Cervical: No cervical adenopathy.     Upper Body:     Right upper body: No supraclavicular, axillary  or pectoral adenopathy.     Left upper body: No supraclavicular, axillary or pectoral adenopathy.  Skin:    General: Skin is warm and dry.     Comments: Mild erythema with areas of cracking of skin to palmar aspect of bilateral hands.  Neurological:     Mental Status: She is alert and oriented to person, place, and time.     Deep Tendon Reflexes: Reflexes are normal and symmetric.     Reflex Scores:      Brachioradialis reflexes are 2+ on the right side and 2+ on the left side.      Patellar reflexes are 2+ on the right side and 2+ on the left side. Psychiatric:        Attention and Perception: Attention normal.        Mood and Affect: Mood normal.        Behavior: Behavior normal. Behavior is cooperative.        Thought Content: Thought content normal.        Judgment: Judgment normal.     Results for orders placed or performed during the hospital encounter of 01/11/18  Urine Culture   Specimen: Urine, Random  Result Value Ref Range   Specimen Description      URINE, RANDOM Performed at [facility], [address], [place], [place] [address]    Special Requests      NONE Performed at [facility], [address] [place], [place] [address]    Culture      NO GROWTH Performed at [facility], [place] [address], [place], [place] [address]    Report Status 01/13/2018 FINAL   Comprehensive metabolic panel  Result Value Ref Range   Sodium 137 135 - 145 mmol/L   Potassium 3.8 3.5 - 5.1 mmol/L   Chloride 103 101 - 111 mmol/L   CO2 23 22 - 32 mmol/L   Glucose, Bld 107 (H) 65 - 99 mg/dL   BUN 14 6  - 20 mg/dL   Creatinine, Ser 0.78 0.44 - 1.00 mg/dL   Calcium 9.2 8.9 - 10.3 mg/dL   Total Protein 7.5 6.5 - 8.1 g/dL   Albumin 4.2 3.5 - 5.0 g/dL   AST 81 (H) 15 - 41 U/L   ALT 99 (H) 14 - 54 U/L   Alkaline Phosphatase 88 38 - 126 U/L   Total Bilirubin 0.3 0.3 - 1.2 mg/dL   GFR calc non Af Amer >60 >60 mL/min   GFR calc Af Amer >60 >60 mL/min   Anion gap 11 5 - 15  Ethanol  Result Value Ref Range   Alcohol, Ethyl (B) Q000111Q Q000111Q mg/dL  Salicylate level  Result Value Ref Range   Salicylate Lvl Q000111Q 2.8 - 30.0 mg/dL  Acetaminophen level  Result Value Ref Range   Acetaminophen (Tylenol), Serum <10 (L) 10 - 30 ug/mL  cbc  Result Value Ref Range   WBC 8.9 3.6 - 11.0 K/uL   RBC 4.04 3.80 - 5.20 MIL/uL   Hemoglobin 13.7 12.0 - 16.0 g/dL   HCT 40.1 35.0 - 47.0 %   MCV 99.3 80.0 - 100.0 fL   MCH 33.9 26.0 - 34.0 pg   MCHC 34.1 32.0 - 36.0 g/dL   RDW 15.3 (H) 11.5 - 14.5 %   Platelets 283 150 - 440 K/uL  Urine Drug Screen, Qualitative  Result Value Ref Range   Tricyclic, Ur Screen NONE DETECTED NONE DETECTED   Amphetamines, Ur Screen NONE  DETECTED NONE DETECTED   MDMA (Ecstasy)Ur Screen NONE DETECTED NONE DETECTED   Cocaine Metabolite,Ur [place] NONE DETECTED NONE DETECTED   Opiate, Ur Screen NONE DETECTED NONE DETECTED   Phencyclidine (PCP) Ur S NONE DETECTED NONE DETECTED   Cannabinoid 50 Ng, Ur [place] POSITIVE (A) NONE DETECTED   Barbiturates, Ur Screen NONE DETECTED NONE DETECTED   Benzodiazepine, Ur Scrn NONE DETECTED NONE DETECTED   Methadone Scn, Ur NONE DETECTED NONE DETECTED  Urinalysis, Complete w Microscopic  Result Value Ref Range   Color, Urine COLORLESS (A) YELLOW   APPearance CLEAR (A) CLEAR   Specific Gravity, Urine 1.002 (L) 1.005 - 1.030   pH 6.0 5.0 - 8.0   Glucose, UA NEGATIVE NEGATIVE mg/dL   Hgb urine dipstick MODERATE (A) NEGATIVE   Bilirubin Urine NEGATIVE NEGATIVE   Ketones, ur NEGATIVE NEGATIVE mg/dL   Protein, ur NEGATIVE NEGATIVE mg/dL   Nitrite NEGATIVE  NEGATIVE   Leukocytes, UA NEGATIVE NEGATIVE   WBC, UA 0-5 0 - 5 WBC/hpf   Bacteria, UA NONE SEEN NONE SEEN   Squamous Epithelial / LPF NONE SEEN 0 - 5      Assessment & Plan:   Problem List Items Addressed This Visit      Respiratory   COPD (chronic obstructive pulmonary disease) (HCC)   Relevant Orders   Comprehensive metabolic panel   CBC with Differential/Platelet     Nervous and Auditory   Nicotine dependence, cigarettes, w unsp disorders    I have recommended complete cessation of tobacco use. I have discussed various options available for assistance with tobacco cessation including over the counter methods (Nicotine gum, patch and lozenges). We also discussed prescription options (Chantix, Nicotine Inhaler / Nasal Spray). The patient is not interested in pursuing any prescription tobacco cessation options at this time. CT lung screening ordered.         Musculoskeletal and Integument   Psoriatic arthritis ([place])    Continue collaboration with rheumatology at [facility].  She plans on contacting them tomorrow about her hands.        Other   Severe recurrent major depression without psychotic features ([place]) - Primary    Chronic, ongoing.  Continue current medication regimen and collaboration with psychiatry and therapist. Denies SI/HI.       Other Visit Diagnoses    Weight gain       Will check thyroid panel per patient request.   Relevant Orders   TSH   Pap smear for cervical cancer screening       Relevant Orders   Cytology - PAP   Screening cholesterol level       Relevant Orders   Lipid Panel w/o Chol/HDL Ratio   Flu vaccine need       Relevant Orders   Flu Vaccine QUAD 36+ mos IM (Completed)   Personal history of nicotine dependence        Relevant Orders   CT CHEST LUNG CA SCREEN LOW DOSE W/O CM       Follow up plan: Return in about 6 months (around 11/29/2019) for COPD, Mood, Arthritis.

## 2019-06-02 ENCOUNTER — Other Ambulatory Visit: Admission: RE | Admit: 2019-06-02 | Payer: Medicare Other | Source: Ambulatory Visit

## 2019-06-02 LAB — COMPREHENSIVE METABOLIC PANEL
ALT: 12 IU/L (ref 0–32)
AST: 20 IU/L (ref 0–40)
Albumin/Globulin Ratio: 1.7 (ref 1.2–2.2)
Albumin: 4.1 g/dL (ref 3.8–4.9)
Alkaline Phosphatase: 101 IU/L (ref 39–117)
BUN/Creatinine Ratio: 11 (ref 9–23)
BUN: 10 mg/dL (ref 6–24)
Bilirubin Total: <0.2 mg/dL (ref 0.0–1.2)
CO2: 24 mmol/L (ref 20–29)
Calcium: 9.1 mg/dL (ref 8.7–10.2)
Chloride: 108 mmol/L — ABNORMAL HIGH (ref 96–106)
Creatinine, Ser: 0.94 mg/dL (ref 0.57–1.00)
GFR calc Af Amer: 78 mL/min/{1.73_m2} (ref >59)
GFR calc non Af Amer: 68 mL/min/{1.73_m2} (ref >59)
Globulin, Total: 2.4 g/dL (ref 1.5–4.5)
Glucose: 82 mg/dL (ref 65–99)
Potassium: 4.3 mmol/L (ref 3.5–5.2)
Sodium: 146 mmol/L — ABNORMAL HIGH (ref 134–144)
Total Protein: 6.5 g/dL (ref 6.0–8.5)

## 2019-06-02 LAB — CBC WITH DIFFERENTIAL/PLATELET
Basophils Absolute: 0.1 10*3/uL (ref 0.0–0.2)
Basos: 1 %
EOS (ABSOLUTE): 0.3 10*3/uL (ref 0.0–0.4)
Eos: 4 %
Hematocrit: 39.4 % (ref 34.0–46.6)
Hemoglobin: 13.5 g/dL (ref 11.1–15.9)
Immature Grans (Abs): 0 10*3/uL (ref 0.0–0.1)
Immature Granulocytes: 0 %
Lymphocytes Absolute: 2.7 10*3/uL (ref 0.7–3.1)
Lymphs: 33 %
MCH: 33.3 pg — ABNORMAL HIGH (ref 26.6–33.0)
MCHC: 34.3 g/dL (ref 31.5–35.7)
MCV: 97 fL (ref 79–97)
Monocytes Absolute: 0.6 10*3/uL (ref 0.1–0.9)
Monocytes: 8 %
Neutrophils Absolute: 4.4 10*3/uL (ref 1.4–7.0)
Neutrophils: 54 %
Platelets: 326 10*3/uL (ref 150–450)
RBC: 4.05 x10E6/uL (ref 3.77–5.28)
RDW: 13.3 % (ref 11.7–15.4)
WBC: 8.1 10*3/uL (ref 3.4–10.8)

## 2019-06-02 LAB — LIPID PANEL W/O CHOL/HDL RATIO
Cholesterol, Total: 216 mg/dL — ABNORMAL HIGH (ref 100–199)
HDL: 60 mg/dL (ref >39)
LDL Chol Calc (NIH): 137 mg/dL — ABNORMAL HIGH (ref 0–99)
Triglycerides: 108 mg/dL (ref 0–149)
VLDL Cholesterol Cal: 19 mg/dL (ref 5–40)

## 2019-06-02 MED FILL — FLUTICASONE PROPIONATE 50 MCG/ACTUATION NASAL SPRAY,SUSPENSION: NASAL | 30 days supply | Qty: 16 | Fill #2

## 2019-06-02 MED FILL — FLUTICASONE PROPIONATE 50 MCG/ACTUATION NASAL SPRAY,SUSPENSION: 30 days supply | Qty: 16 | Fill #2 | Status: AC

## 2019-06-03 ENCOUNTER — Telehealth: Payer: Self-pay | Admitting: *Deleted

## 2019-06-03 DIAGNOSIS — Z87891 Personal history of nicotine dependence: Secondary | ICD-10-CM

## 2019-06-03 DIAGNOSIS — Z122 Encounter for screening for malignant neoplasm of respiratory organs: Secondary | ICD-10-CM

## 2019-06-03 NOTE — Unmapped (Signed)
Phone triage completed for appt scheduled 06/06/2019.

## 2019-06-03 NOTE — Telephone Encounter (Signed)
Received referral for initial lung cancer screening scan. Contacted patient and obtained smoking history,(current, 41 pack year) as well as answering questions related to screening process. Patient denies signs of lung cancer such as weight loss or hemoptysis. Patient denies comorbidity that would prevent curative treatment if lung cancer were found. Patient is scheduled for shared decision making visit and CT scan on 06/09/19.

## 2019-06-06 ENCOUNTER — Encounter: Admission: RE | Payer: Self-pay | Source: Home / Self Care

## 2019-06-06 ENCOUNTER — Ambulatory Visit: Admission: RE | Admit: 2019-06-06 | Payer: Medicare Other | Source: Home / Self Care | Admitting: Gastroenterology

## 2019-06-06 ENCOUNTER — Encounter: Admit: 2019-06-06 | Discharge: 2019-06-07 | Payer: MEDICAID | Attending: Dermatology | Primary: Dermatology

## 2019-06-06 DIAGNOSIS — L309 Dermatitis, unspecified: Secondary | ICD-10-CM

## 2019-06-06 DIAGNOSIS — L853 Xerosis cutis: Secondary | ICD-10-CM

## 2019-06-06 DIAGNOSIS — L089 Local infection of the skin and subcutaneous tissue, unspecified: Secondary | ICD-10-CM

## 2019-06-06 SURGERY — COLONOSCOPY WITH PROPOFOL
Anesthesia: General

## 2019-06-06 MED ORDER — CLOBETASOL 0.05 % TOPICAL OINTMENT
Freq: Two times a day (BID) | TOPICAL | 1 refills | 0 days | Status: CP
Start: 2019-06-06 — End: 2020-06-05

## 2019-06-06 NOTE — Unmapped (Signed)
It was nice to see you today! Your resident doctor was Dr. Harrison Mons.    If any of your medications are too expensive, look for a coupon at MadSurgeon.co.nz.  - Enter the medication name, size, and your zip code to find coupons for local pharmacies.   - Print a coupon and bring it to the pharmacy, or pull up the coupon on a smartphone.  - You can also call pharmacies to ask about the cost of your medication before you pick it up.  If you still cannot afford your medication, please let us know.    Please call our clinic at [phone] with any concerns. We look forward to seeing you again!    Basic Skin Care for Sensitive Skin    Bathing: Bathe in warm (not hot!) water. Apply a thick moisturizer right after bathing while your skin is still damp -- the thicker the better!    Soaps/Washes:     ? Cerave Hydrating Cleanser  ? Free & Clear Liquid Cleanser  ? Neutrogena Ultra Gentle Hydrating Cleanser  ? Dove Sensitive Skin Beauty Bar (make sure it's the sensitive version!)  ? Vanicream moisturizing bar    Moisturizers:  ? Petroleum jelly or Vaseline ointment (make sure it says Fragrance Free)  ? Vanicream Moisturizing Cream  ? Vaniply Ointment  ? CeraVe Moisturizing Cream  ? Neutrogena [place] Formula Hand Cream    Shampoos/Conditioners:  ? Free & Clear Shampoo and Conditioner  ? AFM Safe Choice Shampoo    Laundry: All clothing, towels, and linens should be washed in a fragrance-free detergent. These usually come in a white bottle.     Fragrance-Free Detergents:  ? All Free & Clear detergent  ? Tide Free & Gentle detergent  ? Arm & Hammer Sensitive Skin Free & Clear detergent    Dryer Sheets & Fabric Softeners: DO NOT use these! They put fragrance in your clothes.   ? Wool balls for the dryer are okay    Beauty products: Check the inactive ingredients list for all products to make sure they don't contain fragrance!  ? Do NOT use perfumes, body sprays, or colognes!   ? Do NOT use baby wipes or other kinds of wipes for cleaning the face or removing makeup!    Deodorants:  ? Multimedia programmer or Crystal Mineral Deodorant Stone ([employer] or [employer].com) -- apply generously to clean skin; allow a few minutes to dry  ? Vanicream Deodorant   ? Almay Sensitive Skin Anti-Perspirant and Deodorant (Amazon.com)    Household items: Avoid using any products that put fragrance into the air of your home.  Do NOT use:  ? Candles  ? Plug-in air fresheners  ? Bathroom sprays  ? Essential oil diffusers    Sun protection:     Protective clothing is best! The more skin you cover, the better.  ? Wide-brimmed hats  ? Sunglasses  ? Long sleeve shirts or rash-guard shirts   ? Pants or swim tights  ? Wet suits  ? Some clothes have built in SPF called UPF. UPF 50+ clothes can be bought Teacher, English as a foreign language at [employer].com, www.uvskinz.com and BrideEmporium.nl. You can also find them at [facility] and similar outdoor lifestyle stores.    Avoid going outside when the sun is at its strongest!   ? Stay inside between 10:00am and 4:00pm if possible.    Exposed skin should be protected with broad spectrum sunscreen that is SPF 30 or higher     ? Sunscreen should  be reapplied every 2 hours. If you are swimming or sweating, you should towel-dry and reapply every 1 hour!    ? For children (6 months+) or for people with sensitive skin, use titanium dioxide or zinc oxide-based sunscreens.  o Banana Boat Simply Protect Kids  o Vanicream Sunscreen  o Cerave Baby Sunscreen Lotion  o Aveeno Baby Continuous Protection Sensitive Skin Zinc Oxide Sunscreen

## 2019-06-08 ENCOUNTER — Telehealth: Payer: Self-pay

## 2019-06-08 ENCOUNTER — Encounter: Payer: Self-pay | Admitting: *Deleted

## 2019-06-08 MED ORDER — TRAMADOL 50 MG TABLET
ORAL_TABLET | Freq: Four times a day (QID) | ORAL | 0 refills | 2.00000 days | Status: CP | PRN
Start: 2019-06-08 — End: ?

## 2019-06-08 MED ORDER — CEFDINIR 300 MG CAPSULE
ORAL_CAPSULE | Freq: Two times a day (BID) | ORAL | 0 refills | 10 days | Status: CP
Start: 2019-06-08 — End: 2019-06-20

## 2019-06-08 MED ORDER — PREDNISONE 20 MG TABLET
ORAL_TABLET | Freq: Every day | ORAL | 0 refills | 5 days | Status: CP
Start: 2019-06-08 — End: 2019-06-20

## 2019-06-08 NOTE — Unmapped (Signed)
Patient returned my call.  Cc:  Pain, burning, peeling and swelling bilateral hands.  L > R  Started 2 weeks ago.  She was seen by her PCP last week who referred patient to a dermatologist.  Patient was evaluated by derm on 06/06/2019 and a skin swab/culture was done.   The results are still pending.   She was prescribed Clobetasol Ointment to apply qhs with gloves and decrease exposure to water. Treatment is somewhat helpful. She was having so much pain last night she states she applied it twice.     Patient denies any new lotions, medicines, aerosol sprays or detergents.    She feels this is NOT r/t her psoriasis but worse since she can barely use her hands.

## 2019-06-08 NOTE — Unmapped (Signed)
Returned pt call. I have no further recommendations for her skin complaints and asked her to f/u with dermatology. She wants to know if her symptoms are due to psoriasis. I am not sure what her symptoms are due to, and she should f/u with derm for this.     She states she just had her blood work done with a local clinic, and the doctor there told her that the results would be put in your chart here. I asked her to ensure with her local clinic that the labs were faxed to [place] for review.

## 2019-06-08 NOTE — Unmapped (Signed)
Attempted to contact patient to get more information regarding her symptoms.  No response, no voicemail available to leave a message.

## 2019-06-08 NOTE — Unmapped (Signed)
Reason for call:     patient has some hand burning, swelling, burning, and skin peeling. please call    Last ov: Visit date not found  Next ov: 09/06/2019

## 2019-06-08 NOTE — Unmapped (Signed)
DERMATOLOGY CLINIC NOTE      A/P:    Hand dermatitis in pt with hx psoriatic arthritis  Education was provided by discussing the etiology, natural history, course and treatment for the above conditions.  Reassurance and anticipatory guidance were provided  - on MTX 25mg  weekly for PsA per rheum; no hx cutaneous psoriasis  - ddx includes psoriasis vs irritant/allergic contact dermatitis   - possible superinfection with fissures and pain  - will send aerobic culture today  - focus on calming things down so we can get a better sense of dx. May biopsy at f/u.  - clobetasoL (TEMOVATE) 0.05 % ointment; Apply topically Two (2) times a day.  Dispense: 120 g; Refill: 1. Discussed thick layer BID with occlusion under gloves at night. She should wear gloves for housework and wet work.   - The patient was advised to use a gentle cleanser such as Dove or Cetaphil and to avoid anti-bacterial soaps which may be too irritating. We recommended the frequent use of a greasy emollient such as Vaseline to moisturize the skin once to twice daily. We also recommended the avoidance of scratching and irritants to the skin.    Return in 4 weeks (on 07/04/2019) for [facility] hand dermatitis. or sooner as needed      Michele Miller, we sincerely appreciate your consultation and we are available if you have any questions about our mutual patient.      CC:   Chief Complaint   Patient presents with   ??? Skin Problem     swelling in hands, cracking and burning and bleeding, started about 2 weeks ago, worse in left hand, no tx attempted by patient or PCP , clobetasol prescribed by rheumatologist but patient does not use it because it burns        HPI:  59 y.o. year old female seen today in consultation by Michele Asal, MD at the request of Michele Righter, PA for evaluation.     Concerns include:    2 weeks of dry, cracked, swelling skin on bilateral hands. Palms most affected but dorsal fingers also involved. Has fissures that are quite painful and has had to adjust her housework because it is too painful to come in contact with water and chemicals. Has never had anything like this before. No rash elsewhere on body. Nothing on feet. Clobetasol cream burns so she has not been using it.    No recent illnesses and cannot recall contacting anything unusual.    On MTX 25mg  weekly SQ for PsA, has not had cutaneous lesions in the past.    No other new or changing lesions or areas of concern.     Pertinent PMH: No history of skin cancer    Patient Active Problem List   Diagnosis   ??? Depressive disorder   ??? Psoriasis   ??? Tobacco use disorder   ??? Carpal tunnel syndrome   ??? Anxiety   ??? Acute stress reaction   ??? Grief at loss of child   ??? Trigger thumb of right hand   ??? Benzodiazepine misuse   ??? COPD (chronic obstructive pulmonary disease) (CMS-HCC)   ??? Chronic pain   ??? Osteoarthritis cervical spine   ??? GI bleed   ??? Epicondylitis, lateral   ??? Trochanteric bursitis of right hip   ??? Chronic idiopathic constipation   ??? PTSD (post-traumatic stress disorder)     Current Outpatient Medications   Medication Instructions   ??? albuterol  HFA 90 mcg/actuation inhaler Inhale 2 puffs by mouth every six (6) hours as needed for wheezing.   ??? buPROPion (WELLBUTRIN SR) 150 mg, Oral, 2 times a day (standard)   ??? cetirizine (ZYRTEC) 10 mg, Oral, Daily (standard)   ??? clobetasoL (TEMOVATE) 0.05 % cream Topical, 2 times a day (standard)   ??? clobetasoL (TEMOVATE) 0.05 % ointment Topical, 2 times a day (standard)   ??? CYMBALTA 120 mg, Oral, Daily (standard)   ??? diclofenac sodium (VOLTAREN) 1 % gel Apply 2 grams topically to small joint and 4 grams to large joints as needed up to 4 times daily   ??? dicyclomine (BENTYL) 20 mg, Oral, 4 times daily PRN   ??? empty container (BD SHARPS COLLECTOR) Misc Use as directed.   ??? fluticasone propionate (FLONASE) 50 mcg/actuation nasal spray Use 2 sprays in each nostril daily.   ??? gabapentin (NEURONTIN) 300 mg, Oral, 3 times a day (standard)   ??? lidocaine (LIDODERM) 5 % patch 1 patch, Transdermal, Every 24 hours, Apply to affected area for 12 hours only each day (then remove patch)   ??? meclizine (ANTIVERT) 12.5 mg tablet No dose, route, or frequency recorded.   ??? methotrexate 25 mg, Subcutaneous, Weekly   ??? peg-electrolyte soln (GOLYTELY) 420 gram SolR Take as directed   ??? polyethylene glycol (MIRALAX) 17 gram packet Mix the contents of 1 packet (17 g) in 4-8 ounces of water, juice, soda, coffee, or tea and take by mouth daily for 5 days, then use as needed   ??? STIOLTO RESPIMAT 2.5-2.5 mcg/actuation Mist INL 2 PFS PO ITL D   ??? syringe with needle 1 mL 27 x 1/2 Syrg Use as directed for weekly Methotrexate injections   ??? tiotropium bromide (SPIRIVA RESPIMAT) 1.25 mcg/actuation Mist Inhale 2 puffs (2.5 mcg) BY MOUTH daily.   ??? tiZANidine (ZANAFLEX) 4 mg, Oral, Every 8 hours PRN   ??? traZODone (DESYREL) 100 mg, Oral, Nightly         FH: No family history of melanoma or chronic skin disease    SH: Lives in [place] [address]    ROS: Baseline state of health. 10 point ROS conducted and negative except as noted in HPI.    PE:  General: Well-developed, well-nourished, in no acute distress  Neuro: Alert and oriented, interacts appropriately  Mouth: not examined as patient wearing a mask  Ext: no inflammatory or dystrophic changes of nails  Skin: Per patient request, focused examination with inspection and palpation of the head, neck, right upper extremity, left upper extremity, was performed and notable for the following. All other areas examined were normal or had no significant findings.  - erythema, scaling, and fissuring of b/l hands, palms > dorsal fingers

## 2019-06-08 NOTE — Telephone Encounter (Signed)
Unable to contact patient.  Attempted to return patients call her voicemail is not set up.  Thanks [employer]

## 2019-06-09 ENCOUNTER — Inpatient Hospital Stay: Payer: Medicare Other | Attending: Oncology | Admitting: Oncology

## 2019-06-09 ENCOUNTER — Other Ambulatory Visit: Payer: Self-pay

## 2019-06-09 ENCOUNTER — Telehealth: Payer: Self-pay | Admitting: Nurse Practitioner

## 2019-06-09 ENCOUNTER — Ambulatory Visit
Admission: RE | Admit: 2019-06-09 | Discharge: 2019-06-09 | Disposition: A | Discharge status: Home / Self Care | Payer: Medicare Other | Source: Ambulatory Visit | Attending: Oncology | Admitting: Oncology

## 2019-06-09 DIAGNOSIS — L4050 Arthropathic psoriasis, unspecified: Secondary | ICD-10-CM

## 2019-06-09 DIAGNOSIS — Z87891 Personal history of nicotine dependence: Secondary | ICD-10-CM | POA: Diagnosis present

## 2019-06-09 DIAGNOSIS — F17210 Nicotine dependence, cigarettes, uncomplicated: Secondary | ICD-10-CM

## 2019-06-09 DIAGNOSIS — Z122 Encounter for screening for malignant neoplasm of respiratory organs: Secondary | ICD-10-CM

## 2019-06-09 NOTE — Unmapped (Signed)
Addended by: Tawny Asal on: 06/08/2019 06:44 PM     Modules accepted: Orders

## 2019-06-09 NOTE — Unmapped (Signed)
Reviewed culture results, which showed: 1+ MSSA  Will plan to treat with cefdinir 300mg  BID x 10 days    Spoke with patient and she reports symptoms are very severe still and she cannot function. Using clobetasol and seeing slight improvement but thus far insufficient. Discussed short course of prednisone for quicker symptomatic relief--cautioned that if dermatitis is caused by psoriasis there is a risk of worsening flare when pred is stopped. Patient indicated understanding and would still like to try it. Given severity of acute pain and interference with sleep will send 2 days worth of tramadol 50mg  q6h PRN to bridge until abx and anti-inflammatories have a chance to work. Advised against acetaminophen as she is on MTX    Again discussed that etiology unclear but will hopefully have a better sense once things have calmed down.    All questions answered to her satisfaction.

## 2019-06-09 NOTE — Unmapped (Signed)
[facility] Refill Coordination Note    Specialty Medication(s) to be Shipped:   Inflammatory Disorders: methotrexate (injectable) **sent refill request**    Other medication(s) to be shipped: Syringes, Cetirizine, dicyclome     Michele Miller, DOB: Jan 19, 1961  Phone: [phone] (home)       All above HIPAA information was verified with patient.     Completed refill call assessment today to schedule patient's medication shipment from the [facility] [phone]).       Specialty medication(s) and dose(s) confirmed: Regimen is correct and unchanged.   Changes to medications: Michele Miller reports no changes at this time.  Changes to insurance: No  Questions for the pharmacist: No    Confirmed patient received Welcome Packet with first shipment. The patient will receive a drug information handout for each medication shipped and additional FDA Medication Guides as required.       DISEASE/MEDICATION-SPECIFIC INFORMATION        N/A    SPECIALTY MEDICATION ADHERENCE     Medication Adherence    Patient reported X missed doses in the last month: 0  Specialty Medication: methotrexate  Patient is on additional specialty medications: No          Methotrexate 25 mg/ml: pt could not tell me how many days she has and stated that she has been in the process of moving    SHIPPING     Shipping address confirmed in Epic.     Delivery Scheduled: Yes, Expected medication delivery date: 06/14/2019.  However, Rx request for refills was sent to the provider as there are none remaining. (methotrexate)     Medication will be delivered via UPS to the home address in Epic WAM.    [name]   [facility] Specialty Technician

## 2019-06-09 NOTE — Progress Notes (Signed)
Virtual Visit via Video Note  I connected with Emily Johnston on 06/09/19 at 11:00 AM EDT by a video enabled telemedicine application and verified that I am speaking with the correct person using two identifiers.  Location: Patient: Home Provider: Office   I discussed the limitations of evaluation and management by telemedicine and the availability of in person appointments. The patient expressed understanding and agreed to proceed.  I discussed the assessment and treatment plan with the patient. The patient was provided an opportunity to ask questions and all were answered. The patient agreed with the plan and demonstrated an understanding of the instructions.   The patient was advised to call back or seek an in-person evaluation if the symptoms worsen or if the condition fails to improve as anticipated.   In accordance with CMS guidelines, patient has met eligibility criteria including age, absence of signs or symptoms of lung cancer.  Social History   Tobacco Use  . Smoking status: Current Every Day Smoker    Packs/day: 1.00    Years: 41.00    Pack years: 41.00    Types: Cigarettes  . Smokeless tobacco: Never Used  Substance Use Topics  . Alcohol use: No  . Drug use: No      A shared decision-making session was conducted prior to the performance of CT scan. This includes one or more decision aids, includes benefits and harms of screening, follow-up diagnostic testing, over-diagnosis, false positive rate, and total radiation exposure.   Counseling on the importance of adherence to annual lung cancer LDCT screening, impact of co-morbidities, and ability or willingness to undergo diagnosis and treatment is imperative for compliance of the program.   Counseling on the importance of continued smoking cessation for former smokers; the importance of smoking cessation for current smokers, and information about tobacco cessation interventions have been given to patient including [facility]uit [facility] and 1800 quit [place] programs.   Written order for lung cancer screening with LDCT has been given to the patient and any and all questions have been answered to the best of my abilities.    Yearly follow up will be coordinated by Shawn Perkins, Thoracic Navigator.  I provided 15 minutes of face-to-face video visit time during this encounter, and > 50% was spent counseling as documented under my assessment & plan.   Jennifer E Burns, NP  

## 2019-06-09 NOTE — Telephone Encounter (Signed)
Blood results printed out and faxed them over to [facility].

## 2019-06-09 NOTE — Telephone Encounter (Signed)
Copied from [place] [id number]. Topic: General - Other >> Jun 09, 2019  1:06 PM Keene Breath wrote: Reason for CRM: Patient called to ask that her blood results be sent to her rheumatologist in [facility].  She stated that they need it for further treatment.  It should be sent to [facility], # [id number], Attn: Donzetta Kohut.  If there is any other information needed, please call patient to discuss.  CB# [phone]

## 2019-06-10 MED ORDER — METHOTREXATE SODIUM 25 MG/ML INJECTION SOLUTION
SUBCUTANEOUS | 5 refills | 28.00000 days | Status: CP
Start: 2019-06-10 — End: ?

## 2019-06-10 NOTE — Unmapped (Signed)
Patient notified and verbalized understanding. 

## 2019-06-10 NOTE — Unmapped (Signed)
-----   Message from Staci Righter, [place] sent at 06/10/2019  1:41 PM EDT -----  Please call patient to let her know her labs were stable

## 2019-06-10 NOTE — Unmapped (Signed)
Received labs from [facility] dated 06/01/2019  BUN 10  Creatinine 0.94  AST 20  ALT 12  Albumin 4.1  Calcium 9.1  WBC 8.1  Hemoglobin 13.5  Hematocrit 39.4  Platelets 326    Will be scanned into media

## 2019-06-13 MED FILL — DICYCLOMINE 10 MG CAPSULE: 30 days supply | Qty: 240 | Fill #1 | Status: AC

## 2019-06-13 MED FILL — BD TUBERCULIN SYRINGE 1 ML 27 X 1/2": SUBCUTANEOUS | 28 days supply | Qty: 4 | Fill #2

## 2019-06-13 MED FILL — DICYCLOMINE 10 MG CAPSULE: ORAL | 30 days supply | Qty: 240 | Fill #1

## 2019-06-13 MED FILL — CETIRIZINE 10 MG TABLET: ORAL | 90 days supply | Qty: 90 | Fill #2

## 2019-06-13 MED FILL — BD TUBERCULIN SYRINGE 1 ML 27 X 1/2": 28 days supply | Qty: 4 | Fill #2 | Status: AC

## 2019-06-13 MED FILL — METHOTREXATE SODIUM 25 MG/ML INJECTION SOLUTION: SUBCUTANEOUS | 28 days supply | Qty: 4 | Fill #0

## 2019-06-13 MED FILL — METHOTREXATE SODIUM 25 MG/ML INJECTION SOLUTION: 28 days supply | Qty: 4 | Fill #0 | Status: AC

## 2019-06-13 MED FILL — CETIRIZINE 10 MG TABLET: 90 days supply | Qty: 90 | Fill #2 | Status: AC

## 2019-06-15 ENCOUNTER — Encounter: Payer: Self-pay | Admitting: *Deleted

## 2019-06-15 ENCOUNTER — Encounter: Payer: Self-pay | Admitting: Nurse Practitioner

## 2019-06-15 DIAGNOSIS — I700 Atherosclerosis of aorta: Secondary | ICD-10-CM | POA: Insufficient documentation

## 2019-06-16 LAB — CYTOLOGY - PAP
Adequacy: ABSENT
Diagnosis: NEGATIVE
High risk HPV: NEGATIVE
Molecular Disclaimer: 56
Molecular Disclaimer: NORMAL

## 2019-06-20 ENCOUNTER — Ambulatory Visit
Admit: 2019-06-20 | Discharge: 2019-06-21 | Payer: MEDICAID | Attending: Orthopaedic Surgery | Primary: Orthopaedic Surgery

## 2019-06-20 DIAGNOSIS — M4802 Spinal stenosis, cervical region: Secondary | ICD-10-CM

## 2019-06-20 DIAGNOSIS — G959 Disease of spinal cord, unspecified: Secondary | ICD-10-CM
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
## 2019-06-20 NOTE — Unmapped (Addendum)
Your diagnosis: Cervical stenosis, myelopathy.    Recommendations/Plan  ?? Medications: Take medications as precribed by your Medical Team  ?? Activity: Activities as tolerated.  ?? Surgical Care: You are a candidate for surgery. (Anterior Cervical Fusion.)    ?? We will fax a medical clearance letter to your doctor, Michele Miller.  Please call her in a few days.  ?? We've sent a note to your Rheumatologist, Michele Miller, regarding the methotrexate.  ?? We reviewed again that surgery won't help your pain nor your headaches. You'll lose some motion of your neck.  Surgery is intended to make sure you keep your strength in your hands and to ensure you don't get worse with your bad balance.  Unfortunately, there is a chance that surgery will make your pain worse.  ?? Risks are higher due to your smoking but you've done all you can .. and you're down to 1/2 pack per day which is better than normal for you.  ?? Imaging studies: None  ?? Follow-up: To be determined.  Please contact us via MyChart or phone if any problems arise.    Contact our team electronically via MyChart message to Michele Miller.    Contact our team at [phone] or [phone] with any questions/concerns.

## 2019-06-20 NOTE — Unmapped (Signed)
-----   Message from Corena Pilgrim, MD sent at 06/20/2019  2:14 PM EDT -----  Regarding: RE: Neck surgery  Thanks Drue Stager-  We generally do not recommend holding MTX, but if you prefer she can hold it for a week before and a week after her procedure. Holding it longer may predispose to flares which could complicate her recovery as well.  Thanks,  Marchelle Folks  ----- Message -----  From: Nemiah Commander, MD  Sent: 06/20/2019   1:53 PM EDT  To: Corena Pilgrim, MD, Nemiah Commander, MD  Subject: Neck surgery                                     Michele Noe,    Ms. R is indicated for neck surgery.    Can you give me some recs re: her medications?  Would like to hold MTX pre and post op if possible.    Drue Stager

## 2019-07-04 ENCOUNTER — Encounter: Admit: 2019-07-04 | Discharge: 2019-07-05 | Payer: MEDICARE | Attending: Dermatology | Primary: Dermatology

## 2019-07-04 DIAGNOSIS — L309 Dermatitis, unspecified: Principal | ICD-10-CM

## 2019-07-04 MED ORDER — MUPIROCIN 2 % TOPICAL OINTMENT: g | 2 refills | 0 days | Status: AC

## 2019-07-04 NOTE — Unmapped (Signed)
DERMATOLOGY CLINIC NOTE      A/P:    Hand dermatitis in pt with hx psoriatic arthritis, improving  - on MTX 25mg  weekly for PsA per rheum; no hx cutaneous psoriasis  - ddx includes psoriasis vs irritant/allergic contact dermatitis. Acute nature of flare possibly more c/w with contact-mediated process. Discussed with pt and she declines bx today but depending on progress may pursue at later time. Could also consider patch testing in the future depending on progress.  - since LV significant improvement in symptoms and exam -- treated with keflex, topicals, and short course of prednisone    - CONT clobetasoL (TEMOVATE) 0.05 % ointment; Apply topically Two (2) times a day.  Dispense: 120 g; Refill: 1. Discussed thick layer BID with occlusion under gloves at night. She should wear gloves for housework and wet work.   - START urea cream to help with scaling. Info provided on OTC options  - mupirocin (BACTROBAN) 2 % ointment; Apply as needed to cracked areas on hands  Dispense: 30 g; Refill: 2  - The patient was advised to use a gentle cleanser such as Dove or Cetaphil and to avoid anti-bacterial soaps which may be too irritating. We recommended the frequent use of a greasy emollient such as Vaseline to moisturize the skin once to twice daily. We also recommended the avoidance of scratching and irritants to the skin.    Return in about 5 weeks (around 08/08/2019) for [facility] hand dermatitis. or sooner as needed      Michele Miller, we sincerely appreciate your consultation and we are available if you have any questions about our mutual patient.      CC:   Chief Complaint   Patient presents with   ??? Dermatitis     ON HANDS SEEING A LITTLE BIT OF IMPROVEMENT USING THE OINTMENTS        HPI:  58 y.o. year old female seen today in follow up for hand dermatitis. Last seen 06/06/2019.    Concerns include: Improvement in hands since LV. Feels the prednisone and antibiotics helped calm things down and the clobetasol is slowly helping smooth things out. Still with some scaling that she finds bothersome and one or two tender areas but overall much improved. Trying to wear gloves while working around the house. Also using cotton gloves w/clobetasol at night. Still cannot think of anything she may have contacted that could have caused this.    No other new or changing lesions or areas of concern.     Pertinent PMH: No history of skin cancer    Patient Active Problem List   Diagnosis   ??? Depressive disorder   ??? Psoriasis   ??? Tobacco use disorder   ??? Carpal tunnel syndrome   ??? Anxiety   ??? Acute stress reaction   ??? Grief at loss of child   ??? Trigger thumb of right hand   ??? Benzodiazepine misuse   ??? COPD (chronic obstructive pulmonary disease) (CMS-HCC)   ??? Chronic pain   ??? Osteoarthritis cervical spine   ??? GI bleed   ??? Epicondylitis, lateral   ??? Trochanteric bursitis of right hip   ??? Chronic idiopathic constipation   ??? PTSD (post-traumatic stress disorder)     Current Outpatient Medications   Medication Instructions   ??? albuterol HFA 90 mcg/actuation inhaler Inhale 2 puffs by mouth every six (6) hours as needed for wheezing.   ??? cetirizine (ZYRTEC) 10 mg, Oral, Daily (standard)   ???  clobetasoL (TEMOVATE) 0.05 % cream Topical, 2 times a day (standard)   ??? clobetasoL (TEMOVATE) 0.05 % ointment Topical, 2 times a day (standard)   ??? CYMBALTA 120 mg, Oral, Daily (standard)   ??? diclofenac sodium (VOLTAREN) 1 % gel Apply 2 grams topically to small joint and 4 grams to large joints as needed up to 4 times daily   ??? dicyclomine (BENTYL) 20 mg, Oral, 4 times daily PRN   ??? empty container (BD SHARPS COLLECTOR) Misc Use as directed.   ??? fluticasone propionate (FLONASE) 50 mcg/actuation nasal spray Use 2 sprays in each nostril daily.   ??? gabapentin (NEURONTIN) 300 mg, Oral, 3 times a day (standard) ??? meclizine (ANTIVERT) 12.5 mg tablet No dose, route, or frequency recorded.   ??? methotrexate 25 mg, Subcutaneous, Weekly   ??? mupirocin (BACTROBAN) 2 % ointment Apply as needed to cracked areas on hands   ??? peg-electrolyte soln (GOLYTELY) 420 gram SolR Take as directed   ??? polyethylene glycol (MIRALAX) 17 gram packet Mix the contents of 1 packet (17 g) in 4-8 ounces of water, juice, soda, coffee, or tea and take by mouth daily for 5 days, then use as needed   ??? STIOLTO RESPIMAT 2.5-2.5 mcg/actuation Mist INL 2 PFS PO ITL D   ??? syringe with needle 1 mL 27 x 1/2 Syrg Use as directed for weekly Methotrexate injections   ??? tiotropium bromide (SPIRIVA RESPIMAT) 1.25 mcg/actuation Mist Inhale 2 puffs (2.5 mcg) BY MOUTH daily.   ??? tiZANidine (ZANAFLEX) 4 mg, Oral, Every 8 hours PRN   ??? traMADoL (ULTRAM) 50 mg, Oral, Every 6 hours PRN       SH: Lives in [place] [address]    ROS: Baseline state of health. No fever, no other skin complaints except as noted in HPI.    PE:  General: Well-developed, well-nourished, in no acute distress  Neuro: Alert and oriented, interacts appropriately  Mouth: not examined as patient wearing a mask  Ext: no inflammatory or dystrophic changes of nails  Skin: Per patient request, focused examination with inspection and palpation of the head, neck, right upper extremity, left upper extremity, was performed and notable for the following. All other areas examined were normal or had no significant findings.  - improvement in erythema, scaling, and fissuring of b/l hands, palms > dorsal fingers

## 2019-07-04 NOTE — Unmapped (Signed)
It was nice to see you today! Your resident doctor was Dr. Harrison Mons.    If any of your medications are too expensive, look for a coupon at [email or web address].  - Enter the medication name, size, and your zip code to find coupons for local pharmacies.   - Print a coupon and bring it to the pharmacy, or pull up the coupon on a smartphone.  - You can also call pharmacies to ask about the cost of your medication before you pick it up.  If you still cannot afford your medication, please let us know.    Please call our clinic at [phone] with any concerns. We look forward to seeing you again!    Keep up the great work with the clobetasol and with protecting the skin from water!    Look for a cream containing urea to help smooth out the skin. Brands include Eucerin (drugstores), PurSources ([employer]), and [employer] Public relations account executive). You can use this in between uses of clobetasol.              We'll also send a topical antibiotic called mupirocin that you can use as needed on cracked areas

## 2019-07-08 NOTE — Unmapped (Signed)
[facility] Refill Coordination Note    Specialty Medication(s) to be Shipped:   Inflammatory Disorders: methotrexate (injectable)    Other medication(s) to be shipped: n/a     [name], DOB: 1960-09-29  Phone: [phone] (home)       All above HIPAA information was verified with patient.     Completed refill call assessment today to schedule patient's medication shipment from the [facility] [phone]).       Specialty medication(s) and dose(s) confirmed: Regimen is correct and unchanged.   Changes to medications: [name] reports no changes at this time.  Changes to insurance: No  Questions for the pharmacist: No    Confirmed patient received Welcome Packet with first shipment. The patient will receive a drug information handout for each medication shipped and additional FDA Medication Guides as required.       DISEASE/MEDICATION-SPECIFIC INFORMATION        For patients on injectable medications: Patient currently has 2 doses left.  Next injection is scheduled for 07/11/19.    SPECIALTY MEDICATION ADHERENCE     Medication Adherence    Patient reported X missed doses in the last month: 0  Specialty Medication: methotrexate  Patient is on additional specialty medications: No  Patient is on more than two specialty medications: No  Any gaps in refill history greater than 2 weeks in the last 3 months: no  Demonstrates understanding of importance of adherence: yes  Informant: patient              Methotrexate 25mg /ml: patient has 14 days of medication on hand      SHIPPING     Shipping address confirmed in Epic.     Delivery Scheduled: Yes, Expected medication delivery date: 07/13/19.     Medication will be delivered via UPS to the home address in Epic WAM.    [name]   [facility] Specialty Technician

## 2019-07-12 DIAGNOSIS — L4050 Arthropathic psoriasis, unspecified: Principal | ICD-10-CM

## 2019-07-12 DIAGNOSIS — L409 Psoriasis, unspecified: Principal | ICD-10-CM

## 2019-07-12 MED FILL — METHOTREXATE SODIUM 25 MG/ML INJECTION SOLUTION: SUBCUTANEOUS | 28 days supply | Qty: 4 | Fill #1

## 2019-07-12 MED FILL — METHOTREXATE SODIUM 25 MG/ML INJECTION SOLUTION: 28 days supply | Qty: 4 | Fill #1 | Status: AC

## 2019-07-13 ENCOUNTER — Other Ambulatory Visit: Payer: Self-pay

## 2019-07-13 ENCOUNTER — Encounter: Payer: Self-pay | Admitting: Nurse Practitioner

## 2019-07-13 ENCOUNTER — Ambulatory Visit (INDEPENDENT_AMBULATORY_CARE_PROVIDER_SITE_OTHER): Payer: Medicare Other | Admitting: Nurse Practitioner

## 2019-07-13 VITALS — BP 117/80 | HR 67 | Temp 98.4°F

## 2019-07-13 DIAGNOSIS — Z01818 Encounter for other preprocedural examination: Secondary | ICD-10-CM

## 2019-07-13 DIAGNOSIS — F4310 Post-traumatic stress disorder, unspecified: Secondary | ICD-10-CM | POA: Diagnosis not present

## 2019-07-13 DIAGNOSIS — F332 Major depressive disorder, recurrent severe without psychotic features: Secondary | ICD-10-CM | POA: Diagnosis not present

## 2019-07-13 DIAGNOSIS — J410 Simple chronic bronchitis: Secondary | ICD-10-CM | POA: Diagnosis not present

## 2019-07-13 DIAGNOSIS — I700 Atherosclerosis of aorta: Secondary | ICD-10-CM | POA: Diagnosis not present

## 2019-07-13 DIAGNOSIS — F17219 Nicotine dependence, cigarettes, with unspecified nicotine-induced disorders: Secondary | ICD-10-CM

## 2019-07-13 MED ORDER — MECLIZINE HCL 12.5 MG PO TABS: 12.5000 mg | ORAL_TABLET | Freq: Three times a day (TID) | ORAL | 0 refills | Status: DC | PRN

## 2019-07-13 MED FILL — FLUTICASONE PROPIONATE 50 MCG/ACTUATION NASAL SPRAY,SUSPENSION: 30 days supply | Qty: 16 | Fill #3 | Status: AC

## 2019-07-13 MED FILL — FLUTICASONE PROPIONATE 50 MCG/ACTUATION NASAL SPRAY,SUSPENSION: NASAL | 30 days supply | Qty: 16 | Fill #3

## 2019-07-13 NOTE — Assessment & Plan Note (Signed)
Chronic, ongoing.  Continue current medication regimen and collaboration with psychiatry + therapy sessions, which patient has reported benefit from.  Denies SI/HI.

## 2019-07-13 NOTE — Progress Notes (Signed)
BP 117/80   Pulse 67   Temp 98.4 F (36.9 C) (Oral)   SpO2 97%    Subjective:    Patient ID: Emily Johnston, female    DOB: 1960/10/02, 58 y.o.   MRN: [id number]  HPI: Emily Johnston is a 58 y.o. female  Chief Complaint  Patient presents with  . Depression    6 week f/up  . COPD    6 week f/up  . surgery clearance   DEPRESSION Currently followed by psychiatry team, [facility] in [place].  Last seen a few weeks ago per patient report.  Continues on Cymbalta, Klonopin, and Celexa.  Is being followed by therapy too, which she reports benefit from. Mood status: stable Satisfied with current treatment?: yes Symptom severity: moderate Duration of current treatment : chronic Side effects: no Medication compliance: good compliance Psychotherapy/counseling: yes current Depressed mood: yes Anxious mood: yes Anhedonia: no Significant weight loss or gain: no Insomnia: yes hard to stay asleep Fatigue: no Feelings of worthlessness or guilt: no Impaired concentration/indecisiveness: yes Suicidal ideations: no Hopelessness: no Crying spells: no Depression screen [facility] 2/9 07/13/2019 06/01/2019 03/22/2019 02/22/2019  Decreased Interest 2 2 3 3   Down, Depressed, Hopeless 3 3 3 3   PHQ - 2 Score 5 5 6 6   Altered sleeping 2 3 3 1   Tired, decreased energy 3 2 3 3   Change in appetite 2 2 3 3   Feeling bad or failure about yourself  3 3 3 3   Trouble concentrating 3 3 3 3   Moving slowly or fidgety/restless 2 3 2 1   Suicidal thoughts 0 0 0 0  PHQ-9 Score 20 21 23 20   Difficult doing work/chores - Somewhat difficult Somewhat difficult Very difficult   GAD 7 : Generalized Anxiety Score 06/01/2019 03/22/2019 02/22/2019  Nervous, Anxious, on Edge 2 3 3   Control/stop worrying 1 3 3   Worry too much - different things 1 3 3   Trouble relaxing 1 3 3   Restless 1 3 1   Easily annoyed or irritable 0 2 2  Afraid - awful might happen 0 2 1  Total GAD 7 Score 6 19 16   Anxiety  Difficulty Somewhat difficult Very difficult Very difficult   COPD Has been smoking 3/4 PPD, has been smoking since age 56.  Continues on [employer].  Had recent lung CT CA screening, which was benign, but did not aortic atherosclerosis and emphysema.  COPD status: stable Satisfied with current treatment?: yes Oxygen use: no Dyspnea frequency: occasional Cough frequency: occasional Rescue inhaler frequency:  once at night time Limitation of activity: no Productive cough: currently Last Spirometry: 03/22/2019 Pneumovax: Up to Date Influenza: Up to Date   PRE OP PAPERS: Papers sent from [facility] to sign. She will be having neck surgery, "hopefully before end of year".  Per form all pre-op testing will be done at [facility], including labs and imaging.  No needs on visit today, except signature reporting patient able to have surgery.  Relevant past medical, surgical, family and social history reviewed and updated as indicated. Interim medical history since our last visit reviewed. Allergies and medications reviewed and updated.  Review of Systems  Constitutional: Negative for activity change, appetite change, diaphoresis, fatigue and fever.  Respiratory: Negative for cough, chest tightness and shortness of breath.   Cardiovascular: Negative for chest pain, palpitations and leg swelling.  Gastrointestinal: Negative for abdominal distention, abdominal pain, constipation, diarrhea, nausea and vomiting.  Endocrine: Negative for cold intolerance and heat intolerance.  Neurological: Negative for  dizziness, syncope, weakness, light-headedness, numbness and headaches.  Psychiatric/Behavioral: Negative.     Per HPI unless specifically indicated above     Objective:    BP 117/80   Pulse 67   Temp 98.4 F (36.9 C) (Oral)   SpO2 97%   Wt Readings from Last 3 Encounters:  06/09/19 134 lb (60.8 kg)  06/01/19 136 lb (61.7 kg)  03/22/19 134 lb 12.8 oz (61.1 kg)    Physical Exam Vitals signs and nursing  note reviewed.  Constitutional:      General: She is awake. She is not in acute distress.    Appearance: She is well-developed. She is not ill-appearing.  HENT:     Head: Normocephalic.     Right Ear: Hearing normal.     Left Ear: Hearing normal.  Eyes:     General: Lids are normal.        Right eye: No discharge.        Left eye: No discharge.     Conjunctiva/sclera: Conjunctivae normal.     Pupils: Pupils are equal, round, and reactive to light.  Neck:     Musculoskeletal: Normal range of motion and neck supple.     Thyroid: No thyromegaly.     Vascular: No carotid bruit.  Cardiovascular:     Rate and Rhythm: Normal rate and regular rhythm.     Heart sounds: Normal heart sounds. No murmur. No gallop.   Pulmonary:     Effort: Pulmonary effort is normal. No accessory muscle usage or respiratory distress.     Breath sounds: Normal breath sounds.  Chest:     Breasts:        Right: Normal. No swelling, bleeding, inverted nipple, mass, nipple discharge, skin change or tenderness.        Left: Normal. No swelling, bleeding, inverted nipple, mass, nipple discharge, skin change or tenderness.  Abdominal:     General: Bowel sounds are normal.     Palpations: Abdomen is soft. There is no hepatomegaly or splenomegaly.     Hernia: There is no hernia in the left inguinal area or right inguinal area.  Genitourinary:    Exam position: Lithotomy position.     Labia:        Right: No rash or tenderness.        Left: No rash or tenderness.      Vagina: Normal.     Cervix: Normal.     Uterus: Normal.      Adnexa: Right adnexa normal and left adnexa normal.  Musculoskeletal:     Right lower leg: No edema.     Left lower leg: No edema.  Lymphadenopathy:     Cervical: No cervical adenopathy.     Upper Body:     Right upper body: No supraclavicular, axillary or pectoral adenopathy.     Left upper body: No supraclavicular, axillary or pectoral adenopathy.  Skin:    General: Skin is warm  and dry.  Neurological:     Mental Status: She is alert and oriented to person, place, and time.     Deep Tendon Reflexes: Reflexes are normal and symmetric.     Reflex Scores:      Brachioradialis reflexes are 2+ on the right side and 2+ on the left side.      Patellar reflexes are 2+ on the right side and 2+ on the left side. Psychiatric:        Attention and Perception: Attention normal.  Mood and Affect: Mood normal.        Behavior: Behavior normal. Behavior is cooperative.        Thought Content: Thought content normal.        Judgment: Judgment normal.     Results for orders placed or performed in visit on 06/01/19  Comprehensive metabolic panel  Result Value Ref Range   Glucose 82 65 - 99 mg/dL   BUN 10 6 - 24 mg/dL   Creatinine, Ser 0.94 0.57 - 1.00 mg/dL   GFR calc non Af Amer 68 >59 mL/min/1.73   GFR calc Af Amer 78 >59 mL/min/1.73   BUN/Creatinine Ratio 11 9 - 23   Sodium 146 (H) 134 - 144 mmol/L   Potassium 4.3 3.5 - 5.2 mmol/L   Chloride 108 (H) 96 - 106 mmol/L   CO2 24 20 - 29 mmol/L   Calcium 9.1 8.7 - 10.2 mg/dL   Total Protein 6.5 6.0 - 8.5 g/dL   Albumin 4.1 3.8 - 4.9 g/dL   Globulin, Total 2.4 1.5 - 4.5 g/dL   Albumin/Globulin Ratio 1.7 1.2 - 2.2   Bilirubin Total <0.2 0.0 - 1.2 mg/dL   Alkaline Phosphatase 101 39 - 117 IU/L   AST 20 0 - 40 IU/L   ALT 12 0 - 32 IU/L  CBC with Differential/Platelet  Result Value Ref Range   WBC 8.1 3.4 - 10.8 x10E3/uL   RBC 4.05 3.77 - 5.28 x10E6/uL   Hemoglobin 13.5 11.1 - 15.9 g/dL   Hematocrit 39.4 34.0 - 46.6 %   MCV 97 79 - 97 fL   MCH 33.3 (H) 26.6 - 33.0 pg   MCHC 34.3 31.5 - 35.7 g/dL   RDW 13.3 11.7 - 15.4 %   Platelets 326 150 - 450 x10E3/uL   Neutrophils 54 Not Estab. %   Lymphs 33 Not Estab. %   Monocytes 8 Not Estab. %   Eos 4 Not Estab. %   Basos 1 Not Estab. %   Neutrophils Absolute 4.4 1.4 - 7.0 x10E3/uL   Lymphocytes Absolute 2.7 0.7 - 3.1 x10E3/uL   Monocytes Absolute 0.6 0.1 - 0.9  x10E3/uL   EOS (ABSOLUTE) 0.3 0.0 - 0.4 x10E3/uL   Basophils Absolute 0.1 0.0 - 0.2 x10E3/uL   Immature Granulocytes 0 Not Estab. %   Immature Grans (Abs) 0.0 0.0 - 0.1 x10E3/uL  Lipid Panel w/o Chol/HDL Ratio  Result Value Ref Range   Cholesterol, Total 216 (H) 100 - 199 mg/dL   Triglycerides 108 0 - 149 mg/dL   HDL 60 >39 mg/dL   VLDL Cholesterol Cal 19 5 - 40 mg/dL   LDL Chol Calc (NIH) 137 (H) 0 - 99 mg/dL  Cytology - PAP  Result Value Ref Range   High risk HPV Negative    Adequacy      Satisfactory for evaluation; transformation zone component ABSENT.   Diagnosis      - Negative for intraepithelial lesion or malignancy (NILM)   Molecular Disclaimer      APTIMA HPV assay on the Panther system is an in vitro nucleic acid   Molecular Disclaimer      amplification test for the qualitative detection of E6/E7 viral   Molecular Disclaimer      messenger RNA of High-Risk HPV types 16, 18, 31, 33, 35, 39, 45, 51, 52,   Molecular Disclaimer      56, 58, 59, 66,  68.  This test was validated and its appropriate   Molecular  Disclaimer      performance characteristics determined by the reporting laboratory.   Games developer was performed at [facility]. This laboratory is   Lexicographer under the 123XX123 as qualified to perform high Technical brewer      clinical laboratory testing. Normal Reference Range-Not Detected      Assessment & Plan:   Problem List Items Addressed This Visit      Cardiovascular and Mediastinum   Aortic atherosclerosis ([place])    Discussed at length with patient, will recheck lipid after upcoming surgery and consider starting statin + taking daily baby ASA.  Have recommended complete cessation of smoking and educated patient on risk with smoking.        Respiratory   COPD (chronic obstructive pulmonary disease) (HCC) - Primary    Chronic, ongoing with improvement in symptoms reported with  change to Stiolto.  Continue current medication regimen and adjust as needed.        Nervous and Auditory   Nicotine dependence, cigarettes, w unsp disorders    I have recommended complete cessation of tobacco use. I have discussed various options available for assistance with tobacco cessation including over the counter methods (Nicotine gum, patch and lozenges). We also discussed prescription options (Chantix, Nicotine Inhaler / Nasal Spray). The patient is not interested in pursuing any prescription tobacco cessation options at this time.         Other   Severe recurrent major depression without psychotic features (HCC)    Chronic, ongoing.  Continue current medication regimen and collaboration with psychiatry and therapist. Denies SI/HI.  She is aware all psychiatric medications are to be filled by her psychiatrist and she needs to ensure continued care with them.      PTSD (post-traumatic stress disorder)    Chronic, ongoing.  Continue current medication regimen and collaboration with psychiatry + therapy sessions, which patient has reported benefit from.  Denies SI/HI.       Other Visit Diagnoses    Preop examination       Pre-op paper work signed.  All labs and imaging, EKG to be performed at [facility] per document obtained.       Follow up plan: Return in about 3 months (around 10/13/2019) for Welcome to Medicare.

## 2019-07-13 NOTE — Assessment & Plan Note (Signed)
Discussed at length with patient, will recheck lipid after upcoming surgery and consider starting statin + taking daily baby ASA.  Have recommended complete cessation of smoking and educated patient on risk with smoking.

## 2019-07-13 NOTE — Assessment & Plan Note (Signed)
Chronic, ongoing with improvement in symptoms reported with change to Stiolto.  Continue current medication regimen and adjust as needed.

## 2019-07-13 NOTE — Patient Instructions (Signed)
Fat and Cholesterol Restricted Eating Plan Getting too much fat and cholesterol in your diet may cause health problems. Choosing the right foods helps keep your fat and cholesterol at normal levels. This can keep you from getting certain diseases. Your doctor may recommend an eating plan that includes:  Total fat: ______% or less of total calories a day.  Saturated fat: ______% or less of total calories a day.  Cholesterol: less than _________mg a day.  Fiber: ______g a day. What are tips for following this plan? Meal planning  At meals, divide your plate into four equal parts: ? Fill one-half of your plate with vegetables and green salads. ? Fill one-fourth of your plate with whole grains. ? Fill one-fourth of your plate with low-fat (lean) protein foods.  Eat fish that is high in omega-3 fats at least two times a week. This includes mackerel, tuna, sardines, and salmon.  Eat foods that are high in fiber, such as whole grains, beans, apples, broccoli, carrots, peas, and barley. General tips   Work with your doctor to lose weight if you need to.  Avoid: ? Foods with added sugar. ? Fried foods. ? Foods with partially hydrogenated oils.  Limit alcohol intake to no more than 1 drink a day for nonpregnant women and 2 drinks a day for men. One drink equals 12 oz of beer, 5 oz of wine, or 1 oz of hard liquor. Reading food labels  Check food labels for: ? Trans fats. ? Partially hydrogenated oils. ? Saturated fat (g) in each serving. ? Cholesterol (mg) in each serving. ? Fiber (g) in each serving.  Choose foods with healthy fats, such as: ? Monounsaturated fats. ? Polyunsaturated fats. ? Omega-3 fats.  Choose grain products that have whole grains. Look for the word "whole" as the first word in the ingredient list. Cooking  Cook foods using low-fat methods. These include baking, boiling, grilling, and broiling.  Eat more home-cooked foods. Eat at restaurants and buffets  less often.  Avoid cooking using saturated fats, such as butter, cream, palm oil, palm kernel oil, and coconut oil. Recommended foods  Fruits  All fresh, canned (in natural juice), or frozen fruits. Vegetables  Fresh or frozen vegetables (raw, steamed, roasted, or grilled). Green salads. Grains  Whole grains, such as whole wheat or whole grain breads, crackers, cereals, and pasta. Unsweetened oatmeal, bulgur, barley, quinoa, or brown rice. Corn or whole wheat flour tortillas. Meats and other protein foods  Ground beef (85% or leaner), grass-fed beef, or beef trimmed of fat. Skinless chicken or turkey. Ground chicken or turkey. Pork trimmed of fat. All fish and seafood. Egg whites. Dried beans, peas, or lentils. Unsalted nuts or seeds. Unsalted canned beans. Nut butters without added sugar or oil. Dairy  Low-fat or nonfat dairy products, such as skim or 1% milk, 2% or reduced-fat cheeses, low-fat and fat-free ricotta or cottage cheese, or plain low-fat and nonfat yogurt. Fats and oils  Tub margarine without trans fats. Light or reduced-fat mayonnaise and salad dressings. Avocado. Olive, canola, sesame, or safflower oils. The items listed above may not be a complete list of foods and beverages you can eat. Contact a dietitian for more information. Foods to avoid Fruits  Canned fruit in heavy syrup. Fruit in cream or butter sauce. Fried fruit. Vegetables  Vegetables cooked in cheese, cream, or butter sauce. Fried vegetables. Grains  White bread. White pasta. White rice. Cornbread. Bagels, pastries, and croissants. Crackers and snack foods that contain trans fat   and hydrogenated oils. Meats and other protein foods  Fatty cuts of meat. Ribs, chicken wings, bacon, sausage, bologna, salami, chitterlings, fatback, hot dogs, bratwurst, and packaged lunch meats. Liver and organ meats. Whole eggs and egg yolks. Chicken and turkey with skin. Fried meat. Dairy  Whole or 2% milk, cream,  half-and-half, and cream cheese. Whole milk cheeses. Whole-fat or sweetened yogurt. Full-fat cheeses. Nondairy creamers and whipped toppings. Processed cheese, cheese spreads, and cheese curds. Beverages  Alcohol. Sugar-sweetened drinks such as sodas, lemonade, and fruit drinks. Fats and oils  Butter, stick margarine, lard, shortening, ghee, or bacon fat. Coconut, palm kernel, and palm oils. Sweets and desserts  Corn syrup, sugars, honey, and molasses. Candy. Jam and jelly. Syrup. Sweetened cereals. Cookies, pies, cakes, donuts, muffins, and ice cream. The items listed above may not be a complete list of foods and beverages you should avoid. Contact a dietitian for more information. Summary  Choosing the right foods helps keep your fat and cholesterol at normal levels. This can keep you from getting certain diseases.  At meals, fill one-half of your plate with vegetables and green salads.  Eat high-fiber foods, like whole grains, beans, apples, carrots, peas, and barley.  Limit added sugar, saturated fats, alcohol, and fried foods. This information is not intended to replace advice given to you by your health care provider. Make sure you discuss any questions you have with your health care provider. Document Released: 03/02/2012 Document Revised: 05/05/2018 Document Reviewed: 05/19/2017 Elsevier Patient Education  2020 Elsevier Inc.  

## 2019-07-13 NOTE — Assessment & Plan Note (Signed)
I have recommended complete cessation of tobacco use. I have discussed various options available for assistance with tobacco cessation including over the counter methods (Nicotine gum, patch and lozenges). We also discussed prescription options (Chantix, Nicotine Inhaler / Nasal Spray). The patient is not interested in pursuing any prescription tobacco cessation options at this time.  

## 2019-07-13 NOTE — Assessment & Plan Note (Addendum)
Chronic, ongoing.  Continue current medication regimen and collaboration with psychiatry and therapist. Denies SI/HI.  She is aware all psychiatric medications are to be filled by her psychiatrist and she needs to ensure continued care with them. °

## 2019-07-15 NOTE — Unmapped (Signed)
"  The number you have dialed is not in service"

## 2019-07-15 NOTE — Unmapped (Signed)
Was not able to leave a message d/t voicemail not being set up.

## 2019-07-23 NOTE — Unmapped (Signed)
Patient would like to have surgery after the holiday. Patient is thinking about having surgery in January.

## 2019-07-23 NOTE — Unmapped (Signed)
Attempted to leave a message, the person you have called voice mailbox has not been set up yet.

## 2019-08-01 ENCOUNTER — Other Ambulatory Visit: Payer: Self-pay | Admitting: Nurse Practitioner

## 2019-08-01 MED ORDER — MECLIZINE HCL 12.5 MG PO TABS: 12.5000 mg | ORAL_TABLET | Freq: Three times a day (TID) | ORAL | 0 refills | Status: DC | PRN

## 2019-08-01 NOTE — Telephone Encounter (Signed)
Medication Refill - Medication: meclizine (ANTIVERT) 12.5 MG tablet   Has the patient contacted their pharmacy? Yes.   (Agent: If no, request that the patient contact the pharmacy for the refill.) (Agent: If yes, when and what did the pharmacy advise?)  Preferred Pharmacy (with phone number or street name):  [facility] [place], [place] - [address]  [place] [address]  Phone: [phone] Fax: [phone]     Agent: Please be advised that RX refills may take up to 3 business days. We ask that you follow-up with your pharmacy.

## 2019-08-04 NOTE — Unmapped (Signed)
[facility] Refill Coordination Note    Specialty Medication(s) to be Shipped:   Inflammatory Disorders: methotrexate (injectable)    Other medication(s) to be shipped: n/a     Michele Miller, DOB: [birth year]  Phone: [phone] (home)       All above HIPAA information was verified with patient.     Completed refill call assessment today to schedule patient's medication shipment from the [facility] [phone]).       Specialty medication(s) and dose(s) confirmed: Regimen is correct and unchanged.   Changes to medications: Michele Miller reports no changes at this time.  Changes to insurance: No  Questions for the pharmacist: No    Confirmed patient received Welcome Packet with first shipment. The patient will receive a drug information handout for each medication shipped and additional FDA Medication Guides as required.       DISEASE/MEDICATION-SPECIFIC INFORMATION        For patients on injectable medications: Patient currently has 1 doses left.  Next injection is scheduled for 08/08/19.    SPECIALTY MEDICATION ADHERENCE     Medication Adherence    Patient reported X missed doses in the last month: 0  Specialty Medication: methotrexate  Patient is on additional specialty medications: No  Patient is on more than two specialty medications: No  Any gaps in refill history greater than 2 weeks in the last 3 months: no  Demonstrates understanding of importance of adherence: yes  Informant: patient                Methotrexate 25mg /ml: Patient has 7 days of medication on hand      SHIPPING     Shipping address confirmed in Epic.     Delivery Scheduled: Yes, Expected medication delivery date: 08/10/19.     Medication will be delivered via UPS to the prescription address in Epic WAM.    Michele Miller   [facility] Specialty Technician

## 2019-08-08 ENCOUNTER — Encounter
Admit: 2019-08-08 | Discharge: 2019-08-09 | Payer: MEDICARE | Attending: Student in an Organized Health Care Education/Training Program | Primary: Student in an Organized Health Care Education/Training Program

## 2019-08-08 DIAGNOSIS — L409 Psoriasis, unspecified: Principal | ICD-10-CM

## 2019-08-08 MED ORDER — APREMILAST 10 MG (4)-20 MG (4)-30 MG (47) TABLETS IN A DOSE PACK
PACK | 0 refills | 0 days | Status: CP
Start: 2019-08-08 — End: ?
  Filled 2019-09-02: qty 1, 28d supply, fill #0

## 2019-08-08 NOTE — Unmapped (Signed)
DERMATOLOGY CLINIC NOTE      A/P:    Hand dermatitis in pt with hx psoriatic arthritis, flaring on maximum dose methotrexate  - on MTX 25mg  weekly for PsA per rheum; no hx cutaneous psoriasis  - examination most consistent with psoriasis today given fissuring but AICD also remains a consideration  - patient has responded will to short course of prednisone previously  - given recalcitrant nature, plan to start nbUVB light therapy as well as Otezla 30 mg bid  - discussed with patient main side effects of Otezla including headache, GI upset and depression/SI. Rx sent to [facility] pharmacy today  - in interim, plan to continue topical clobetasol BID with occlusion under cotton gloves at night. Discussed with patient continued avoidance of irritants by using gloves for housework and wet work      Return in about 3 months (around 11/08/2019) for Recheck. or sooner as needed      CC:   Chief Complaint   Patient presents with   ??? Skin Problem     states hand dermatitis has gotten better on left, but right hand is worse        HPI:  58 y.o. year old female seen today in follow up for hand dermatitis. Last seen 07/04/2019.    Patient reports that with topical steroids the L hand has somewhat improved but she has had painful openings of skin on the R hand. Areas hurt and burn. She has been wearing gloves for housework and avoiding harsh irritants to skin. Does have thickening of nails on hands but denies changes on feet. Denies rash elsewhere on body. No prior biopsy has been performed     No other new or changing lesions or areas of concern.     Pertinent PMH: No history of skin cancer    Patient Active Problem List   Diagnosis   ??? Depressive disorder   ??? Psoriasis   ??? Tobacco use disorder   ??? Carpal tunnel syndrome   ??? Anxiety   ??? Acute stress reaction   ??? Grief at loss of child   ??? Trigger thumb of right hand   ??? Benzodiazepine misuse   ??? COPD (chronic obstructive pulmonary disease) (CMS-HCC)   ??? Chronic pain   ??? Osteoarthritis cervical spine   ??? GI bleed   ??? Epicondylitis, lateral   ??? Trochanteric bursitis of right hip   ??? Chronic idiopathic constipation   ??? PTSD (post-traumatic stress disorder)     Current Outpatient Medications   Medication Instructions   ??? albuterol HFA 90 mcg/actuation inhaler Inhale 2 puffs by mouth every six (6) hours as needed for wheezing.   ??? cetirizine (ZYRTEC) 10 mg, Oral, Daily (standard)   ??? clobetasoL (TEMOVATE) 0.05 % cream Topical, 2 times a day (standard)   ??? clobetasoL (TEMOVATE) 0.05 % ointment Topical, 2 times a day (standard)   ??? CYMBALTA 120 mg, Oral, Daily (standard)   ??? diclofenac sodium (VOLTAREN) 1 % gel Apply 2 grams topically to small joint and 4 grams to large joints as needed up to 4 times daily   ??? dicyclomine (BENTYL) 20 mg, Oral, 4 times daily PRN   ??? empty container (BD SHARPS COLLECTOR) Misc Use as directed.   ??? fluticasone propionate (FLONASE) 50 mcg/actuation nasal spray Use 2 sprays in each nostril daily.   ??? gabapentin (NEURONTIN) 300 mg, Oral, 3 times a day (standard)   ??? meclizine (ANTIVERT) 12.5 mg tablet No dose, route, or frequency  recorded.   ??? methotrexate 25 mg, Subcutaneous, Weekly   ??? mirtazapine (REMERON) 15 MG tablet No dose, route, or frequency recorded.   ??? mupirocin (BACTROBAN) 2 % ointment Apply as needed to cracked areas on hands   ??? peg-electrolyte soln (GOLYTELY) 420 gram SolR Take as directed   ??? polyethylene glycol (MIRALAX) 17 gram packet Mix the contents of 1 packet (17 g) in 4-8 ounces of water, juice, soda, coffee, or tea and take by mouth daily for 5 days, then use as needed   ??? STIOLTO RESPIMAT 2.5-2.5 mcg/actuation Mist INL 2 PFS PO ITL D   ??? syringe with needle 1 mL 27 x 1/2 Syrg Use as directed for weekly Methotrexate injections   ??? tiotropium bromide (SPIRIVA RESPIMAT) 1.25 mcg/actuation Mist Inhale 2 puffs (2.5 mcg) BY MOUTH daily.   ??? tiZANidine (ZANAFLEX) 4 mg, Oral, Every 8 hours PRN       SH: Lives in [place] [address]    ROS: all others negative    PE:  General: Well-developed, well-nourished, in no acute distress  Neuro: Alert and oriented, interacts appropriately  Skin:  Examination by inspection and palpation of the focused exam of the neck, BUE, hands and nails   was performed and notable for the following:  - diffuse erythema and scale of L > R hand. L hand with several areas of fissuring  - thickening of bilateral fingernails

## 2019-08-08 NOTE — Unmapped (Signed)
Sent medical clearance forms via fax. Placed in scan bin.

## 2019-08-09 DIAGNOSIS — L409 Psoriasis, unspecified: Principal | ICD-10-CM

## 2019-08-09 MED FILL — METHOTREXATE SODIUM 25 MG/ML INJECTION SOLUTION: SUBCUTANEOUS | 28 days supply | Qty: 4 | Fill #2

## 2019-08-09 MED FILL — METHOTREXATE SODIUM 25 MG/ML INJECTION SOLUTION: 28 days supply | Qty: 4 | Fill #2 | Status: AC
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
## 2019-08-10 NOTE — Unmapped (Signed)
[facility] Specialty Medication Onboarding    Specialty Medication: Kathaleen Maser PACK  Prior Authorization: Approved   Financial Assistance: No - copay  <$25  Final Copay/Day Supply: $3.90 / 28 DAYS    Insurance Restrictions: None     Notes to Pharmacist: Requested a prescription for the maintenance dose, as we only received a prescription for the starter pack.       The triage team has completed the benefits investigation and has determined that the patient is able to fill this medication at [facility]. Please contact the patient to complete the onboarding or follow up with the prescribing physician as needed.

## 2019-08-15 NOTE — Unmapped (Signed)
Otezla continuation prescription $3.60.

## 2019-08-16 NOTE — Unmapped (Addendum)
12/17 - update. I called Michele Miller today to check in on her Michele Miller. Unfortunately, the medication wasn't scheduled to ship out as originally planned. I re-reviewed side effects, and have scheduled to ship SAME DAY courier 12/18. Mas    ----------------------------------------------------------------------------------    [facility]   Patient Onboarding/Medication Counseling    Michele Miller is a 58 y.o. female with psoriatic arthritis/AICD who I am counseling today on initiation of therapy.  I am speaking to the patient.    Verified patient's date of birth / HIPAA.    Specialty medication(s) to be sent: Inflammatory Disorders: Otezla      Non-specialty medications/supplies to be sent: na      Medications not needed at this time: na         Otezla (apremilast)    Medication & Administration     Dosage: Psoriasis: Take 10mg  by mouth in the morning on day 1, 10mg  twice daily on day 2, 10mg  in the morning and 20mg  in the evening on day 3, 20mg  twice daily on day 4, 20mg  in the morning and 30mg  in the evening on day 5, then 30mg  twice daily thereafter    Administration: Take tablets by mouth.  Swallow whole ??? do not chew, break, or crush.    Adherence/Missed dose instructions:  Take a missed dose as soon as you think about it if it's within 6 hours of your normal dosing time.  Never take two doses at the same time to try and catch up after a missed dose.    Goals of Therapy     Beh??et disease  ? Suppress inflammatory exacerbations and recurrences  ? Prevent irreversible organ damage  ? Achieve low disease activity  ? Maintenance of effective psychosocial functioning    Plaque Psoriasis  ? Minimize areas of skin involvement (% BSA)  ? Avoidance of long term glucocorticoid use  ? Maintenance of effective psychosocial functioning    Psoriatic arthritis  ? Achieve remission/inactive disease or low/minimal disease activity  ? Maintenance of function ? Minimization of systemic manifestations and comorbidities  ? Maintenance of effective psychosocial functioning      Side Effects & Monitoring Parameters     ? Upset stomach and/or diarrhea  ? Headache  ? Weight loss    The following side effects should be reported to the provider:  ? Signs of a hypersensitivity reaction ??? rash; hives; itching; red, swollen, blistered, or peeling skin; wheezing; tightness in the chest or throat; difficulty breathing, swallowing, or talking; swelling of the mouth, face, lips, tongue, or throat; etc.  ? Excessive weight loss  ? Changes in mood (depression) or behavior      Contraindications, Warnings, & Precautions     ? Have your bloodwork checked as you have been told by your prescriber  ? Talk with your doctor if you are pregnant, planning to become pregnant, or breastfeeding      Drug/Food Interactions     ? Medication list reviewed in Epic. The patient was instructed to inform the care team before taking any new medications or supplements. No drug interactions identified.   ? Apremilast is a major substrate for CYP3A4 ??? consult care team before starting any new medication (Rx or OTC) or supplements    Storage, Handling Precautions, & Disposal     ? Store at room temperature  ? Protect from moisture        Current Medications (including OTC/herbals), Comorbidities and Allergies     Current Outpatient  Medications   Medication Sig Dispense Refill   ??? albuterol HFA 90 mcg/actuation inhaler Inhale 2 puffs by mouth every six (6) hours as needed for wheezing. 54 g 6   ??? apremilast (OTEZLA) 30 mg Tab Take 1 tablet (30 mg total) by mouth Two (2) times a day. 60 tablet 5   ??? apremilast 10 mg (4)-20 mg (4)-30 mg (47) DsPk Take by mouth as directed on package labeling.  Goal dose is 30 mg twice daily. 1 Package 0   ??? cetirizine (ZYRTEC) 10 MG tablet Take 1 tablet (10 mg total) by mouth daily. 90 tablet 3   ??? clobetasoL (TEMOVATE) 0.05 % cream Apply topically Two (2) times a day. 30 g 0 ??? clobetasoL (TEMOVATE) 0.05 % ointment Apply topically Two (2) times a day. 120 g 1   ??? diclofenac sodium (VOLTAREN) 1 % gel Apply 2 grams topically to small joint and 4 grams to large joints as needed up to 4 times daily 300 g PRN   ??? dicyclomine (BENTYL) 10 mg capsule Take 2 capsules (20 mg total) by mouth 4 (four) times a day as needed. 240 capsule 1   ??? DULoxetine (CYMBALTA) 60 MG capsule Take 2 capsules (120 mg total) by mouth daily. 60 capsule 3   ??? empty container (BD SHARPS COLLECTOR) Misc Use as directed. 1 each 3   ??? fluticasone propionate (FLONASE) 50 mcg/actuation nasal spray Use 2 sprays in each nostril daily. 16 g 4   ??? gabapentin (NEURONTIN) 300 MG capsule Take 1 capsule (300 mg total) by mouth Three (3) times a day. 270 capsule 0   ??? meclizine (ANTIVERT) 12.5 mg tablet      ??? methotrexate 25 mg/mL injection solution Inject 1 mL (25 mg total) under the skin once a week. 4 mL 5   ??? mirtazapine (REMERON) 15 MG tablet      ??? mupirocin (BACTROBAN) 2 % ointment Apply as needed to cracked areas on hands 30 g 2   ??? peg-electrolyte soln (GOLYTELY) 420 gram SolR Take as directed 1 Bottle 0   ??? polyethylene glycol (MIRALAX) 17 gram packet Mix the contents of 1 packet (17 g) in 4-8 ounces of water, juice, soda, coffee, or tea and take by mouth daily for 5 days, then use as needed 30 packet 3   ??? STIOLTO RESPIMAT 2.5-2.5 mcg/actuation Mist INL 2 PFS PO ITL D     ??? syringe with needle 1 mL 27 x 1/2 Syrg Use as directed for weekly Methotrexate injections 50 each 0   ??? tiotropium bromide (SPIRIVA RESPIMAT) 1.25 mcg/actuation Mist Inhale 2 puffs (2.5 mcg) BY MOUTH daily. 4 g 5   ??? tiZANidine (ZANAFLEX) 4 MG tablet Take 1 tablet (4 mg total) by mouth every eight (8) hours as needed. 90 tablet 3     No current facility-administered medications for this visit.        No Known Allergies    Patient Active Problem List   Diagnosis   ??? Depressive disorder   ??? Psoriasis   ??? Tobacco use disorder   ??? Carpal tunnel syndrome ??? Anxiety   ??? Acute stress reaction   ??? Grief at loss of child   ??? Trigger thumb of right hand   ??? Benzodiazepine misuse   ??? COPD (chronic obstructive pulmonary disease) (CMS-HCC)   ??? Chronic pain   ??? Osteoarthritis cervical spine   ??? GI bleed   ??? Epicondylitis, lateral   ??? Trochanteric bursitis of right  hip   ??? Chronic idiopathic constipation   ??? PTSD (post-traumatic stress disorder)       Reviewed and up to date in Epic.    Appropriateness of Therapy     Is medication and dose appropriate based on diagnosis? Yes    Baseline Quality of Life Assessment      How many days over the past month did your PsA/ AICD keep you from your normal activities? 0    Financial Information     Medication Assistance provided: None Required    Anticipated copay of $3.90 reviewed with patient. Verified delivery address.    Delivery Information     Scheduled delivery date: Wed, Dec 2    Expected start date: Wed, Dec 2    Medication will be delivered via Same Day Courier to the prescription address in [place].  This shipment will not require a signature.      Explained the services we provide at [facility] and that each month we would call to set up refills.  Stressed importance of returning phone calls so that we could ensure they receive their medications in time each month.  Informed patient that we should be setting up refills 7-10 days prior to when they will run out of medication.  A pharmacist will reach out to perform a clinical assessment periodically.  Informed patient that a welcome packet and a drug information handout will be sent.      Patient verbalized understanding of the above information as well as how to contact the pharmacy at [phone] option 4 with any questions/concerns.  The pharmacy is open Monday through Friday 8:30am-4:30pm.  A pharmacist is available 24/7 via pager to answer any clinical questions they may have.    Patient Specific Needs ? Does the patient have any physical, cognitive, or cultural barriers? No    ? Patient prefers to have medications discussed with  Patient     ? Is the patient able to read and understand education materials at a high school level or above? No    ? Patient's primary language is  English     ? Is the patient high risk? No     ? Does the patient require a Care Management Plan? No     ? Does the patient require physician intervention or other additional services (i.e. nutrition, smoking cessation, social work)? No      Germaine Ripp A Desiree Lucy Shared [facility] Specialty Pharmacist

## 2019-08-19 NOTE — Unmapped (Signed)
Spoke with patient, gave instructions as directed via Dr.Moe Lim   Also, she'll need to hold her methotrexate for 1 week before and 1 week after surgery.   Need to decrease tobacco as much as possible.      Patient will get a call concerning lab work and Covid testing. Patient was given surgery date. Patient verbalized understanding.

## 2019-08-23 DIAGNOSIS — L4050 Arthropathic psoriasis, unspecified: Principal | ICD-10-CM

## 2019-08-23 MED ORDER — FOLIC ACID 1 MG TABLET
ORAL_TABLET | Freq: Every day | ORAL | 3 refills | 90.00000 days | Status: CP
Start: 2019-08-23 — End: 2020-08-22
  Filled 2019-08-25: qty 90, 90d supply, fill #0

## 2019-08-23 MED ORDER — GABAPENTIN 300 MG CAPSULE
ORAL_CAPSULE | Freq: Three times a day (TID) | ORAL | 0 refills | 90.00000 days | Status: CP
Start: 2019-08-23 — End: 2020-08-22
  Filled 2019-08-25: qty 270, 90d supply, fill #0

## 2019-08-25 ENCOUNTER — Encounter: Admit: 2019-08-25 | Discharge: 2019-08-26 | Payer: MEDICARE

## 2019-08-25 DIAGNOSIS — Z79899 Other long term (current) drug therapy: Principal | ICD-10-CM

## 2019-08-25 DIAGNOSIS — M797 Fibromyalgia: Principal | ICD-10-CM

## 2019-08-25 DIAGNOSIS — L4050 Arthropathic psoriasis, unspecified: Principal | ICD-10-CM

## 2019-08-25 DIAGNOSIS — M4722 Other spondylosis with radiculopathy, cervical region: Principal | ICD-10-CM

## 2019-08-25 MED FILL — FLUTICASONE PROPIONATE 50 MCG/ACTUATION NASAL SPRAY,SUSPENSION: NASAL | 30 days supply | Qty: 16 | Fill #4

## 2019-08-25 MED FILL — CETIRIZINE 10 MG TABLET: 90 days supply | Qty: 90 | Fill #3 | Status: AC

## 2019-08-25 MED FILL — FLUTICASONE PROPIONATE 50 MCG/ACTUATION NASAL SPRAY,SUSPENSION: 30 days supply | Qty: 16 | Fill #4 | Status: AC

## 2019-08-25 MED FILL — GABAPENTIN 300 MG CAPSULE: 90 days supply | Qty: 270 | Fill #0 | Status: AC

## 2019-08-25 MED FILL — CETIRIZINE 10 MG TABLET: ORAL | 90 days supply | Qty: 90 | Fill #3

## 2019-08-25 MED FILL — FOLIC ACID 1 MG TABLET: 90 days supply | Qty: 90 | Fill #0 | Status: AC

## 2019-08-25 NOTE — Unmapped (Signed)
I spent 23 minutes on the phone visit with the patient. I spent an additional 10 minutes on pre- and post-visit activities.     The patient was not located and I was not located within 250 yards of a hospital based location during the phone visit. The patient was physically located in [place] or a state in which I am permitted to provide care. The patient and/or parent/guardian understood that s/he may incur co-pays and cost sharing, and agreed to the telemedicine visit. The visit was reasonable and appropriate under the circumstances given the patient's presentation at the time.    The patient and/or parent/guardian has been advised of the potential risks and limitations of this mode of treatment (including, but not limited to, the absence of in-person examination) and has agreed to be treated using telemedicine. The patient's/patient's family's questions regarding telemedicine have been answered.    If the visit was completed in an ambulatory setting, the patient and/or parent/guardian has also been advised to contact their provider???s office for worsening conditions, and seek emergency medical treatment and/or call 911 if the patient deems either necessary.         REASON FOR VISIT: f/u PsA     Identification: Pt self identified using name and date of birth  Patient location: [place]  The limitations of this telemedicine encounter were discussed with patient. Both the patient and myself agreed to this encounter despite these limitations. Benefits of this telemedicine encounter included allowing for continued care of patient and minimizing risk of exposure to COVID-19.     HISTORY: [name] is a 58 y.o. female with hx of psoriatic arthritis. She also has clinical findings suggestive of centralized chronic pain. Was following pain management but has lost f/u due to transportation issues.    Psoriatic arthritis treated with mtx SQ monotherapy. Also on pharmacotherapy for fibromyalgia from PCP.   Additionally has had some rashes on the hands, initially appeared c/w contact dermatitis, but later findings suggestive of psoriasis, so she was started on otezla 08/2019.  She has multiple psychiatric comorbidities including PTSD and anxiety. These have been exacerbated by her son's untimely death of her son in 42.     Interim history:   Pt presents via phone call for follow up.     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 Documentation from surgeon states she needs to be off mtx 1 wk prior to and after surgery. Surgery planned for January. She states she has been losing her balance bad.     She states her hands have been very bad. She looks like an animal with the skin peeling off. She felt that she may die from this. She gets splits in the skin. No itching, but it was awful. She has been followed by dermatology.    She is now seeing psychiatrist and trying to get her depression well controlled. She had a phone visit with her psychiatrist yesterday and feels that this was actually easier than talking to her in person.     She continues to struggle with depression. Worse lately because her mom has been sick.   Wants to stop remeron due to wt gain. Will discuss with her psychiatrist.   She is decorating for Christmas this year. This is the first year since her son's death that she has decorated for Christmas.     Today pain in the low back and spasms in the back. No  swelling of the joints today. Minimal AM stiffness.     CURRENT MEDICATIONS:  Current Outpatient Medications   Medication Sig Dispense Refill   ??? albuterol HFA 90 mcg/actuation inhaler Inhale 2 puffs by mouth every six (6) hours as needed for wheezing. 54 g 6   ??? apremilast (OTEZLA) 30 mg Tab Take 1 tablet (30 mg total) by mouth Two (2) times a day. 60 tablet 5   ??? apremilast 10 mg (4)-20 mg (4)-30 mg (47) DsPk Take by mouth as directed on package labeling.  Goal dose is 30 mg twice daily. 1 Package 0 ??? cetirizine (ZYRTEC) 10 MG tablet Take 1 tablet (10 mg total) by mouth daily. 90 tablet 3   ??? clobetasoL (TEMOVATE) 0.05 % cream Apply topically Two (2) times a day. (Patient not taking: Reported on 08/25/2019) 30 g 0   ??? clobetasoL (TEMOVATE) 0.05 % ointment Apply topically Two (2) times a day. 120 g 1   ??? diclofenac sodium (VOLTAREN) 1 % gel Apply 2 grams topically to small joint and 4 grams to large joints as needed up to 4 times daily 300 g PRN   ??? dicyclomine (BENTYL) 10 mg capsule Take 2 capsules (20 mg total) by mouth 4 (four) times a day as needed. 240 capsule 1   ??? DULoxetine (CYMBALTA) 60 MG capsule Take 2 capsules (120 mg total) by mouth daily. 60 capsule 3   ??? empty container (BD SHARPS COLLECTOR) Misc Use as directed. 1 each 3   ??? fluticasone propionate (FLONASE) 50 mcg/actuation nasal spray Use 2 sprays in each nostril daily. 16 g 4   ??? folic acid (FOLVITE) 1 MG tablet Take 1 tablet (1 mg total) by mouth daily. 90 tablet 3   ??? gabapentin (NEURONTIN) 300 MG capsule Take 1 capsule (300 mg total) by mouth Three (3) times a day. 270 capsule 0   ??? meclizine (ANTIVERT) 12.5 mg tablet      ??? methotrexate 25 mg/mL injection solution Inject 1 mL (25 mg total) under the skin once a week. 4 mL 5   ??? mirtazapine (REMERON) 15 MG tablet      ??? mupirocin (BACTROBAN) 2 % ointment Apply as needed to cracked areas on hands 30 g 2   ??? peg-electrolyte soln (GOLYTELY) 420 gram SolR Take as directed (Patient not taking: Reported on 08/25/2019) 1 Bottle 0   ??? polyethylene glycol (MIRALAX) 17 gram packet Mix the contents of 1 packet (17 g) in 4-8 ounces of water, juice, soda, coffee, or tea and take by mouth daily for 5 days, then use as needed 30 packet 3   ??? STIOLTO RESPIMAT 2.5-2.5 mcg/actuation Mist INL 2 PFS PO ITL D     ??? syringe with needle 1 mL 27 x 1/2 Syrg Use as directed for weekly Methotrexate injections 50 each 0   ??? tiotropium bromide (SPIRIVA RESPIMAT) 1.25 mcg/actuation Mist Inhale 2 puffs (2.5 mcg) BY MOUTH daily. (Patient not taking: Reported on 08/25/2019) 4 g 5     No current facility-administered medications for this visit.        Past Medical History:   Diagnosis Date   ??? Arthritis    ??? Baker's cyst    ??? COPD (chronic obstructive pulmonary disease) (CMS-HCC)    ??? CTS (carpal tunnel syndrome)    ??? Depression    ??? Headache(784.0)    ??? Joint pain    ??? Psoriasis    ??? PTSD (post-traumatic stress disorder)    ??? Scoliosis  Record Review: Available records were reviewed, including pertinent office visits, labs, and imaging.      REVIEW OF SYSTEMS: Ten system were reviewed and negative except as noted above.    PHYSICAL EXAM:  Patient reported vitals:  There were no vitals filed for this visit.   General:   Does not sound to be in distress   Lungs:  No wheezing, coughing, or increased respiratory effort noted   Psych:  Appropriate interaction       ASSESSMENT/PLAN:  1. Psoriatic arthritis (CMS-HCC) and recently diagnosed cutaneous psoriasis of hands  Arthralgias stable, still suspect that her arthralgias are more related to OA than active psoriatic arthritis. She will be starting otezla for hand cutaneous psoriasis, and it will be interesting to see if she feels her arthralgias are improved with this.   We discussed the risk of worsening depression and SI on otezla, particularly given her longstanding psychiatric history. She was not aware of this risk, but verbalizes understanding today and wishes to proceed with otezla. Recommend discontinuation and going to ER with any SI.   Continue f/u with Derm for cutaneous psoriasis.  Continue mtx 25 mg sq qwk.     2. DJD spine  Noted in both Cspine and Lspine. She appears to be a candidate for Cspine surgery due to concern about neurologic compromise. This is now planned for January, with recommendations to hold mtx 1 wk before and 1 wk after surgery.     3. Fibromyalgia  Symptomatic but stable. Continue pharmacotherapy per PCP.     4. Methotrexate, long term, current use Checking labs below to evaluate for medication toxicity.  Labs mailed to pt to complete locally.   - Albumin; Future  - AST; Future  - ALT; Future  - CBC w/ Differential; Future  - Creatinine; Future          HCM:   - PCV13 Status: Did not discuss today  - PPSV 23 Status: 11/03/2013  - Annual Influenza vaccine. Status: did not discuss today   - Bone health: not on prednisone   - Contraception: Status post hysterectomy  ??    Return appt in 4 mo with Dr Delton See as scheduled

## 2019-08-29 DIAGNOSIS — M4722 Other spondylosis with radiculopathy, cervical region: Principal | ICD-10-CM

## 2019-08-29 DIAGNOSIS — G9520 Unspecified cord compression: Principal | ICD-10-CM

## 2019-09-02 ENCOUNTER — Encounter
Admit: 2019-09-02 | Discharge: 2019-09-02 | Payer: MEDICARE | Attending: Orthopaedic Surgery | Primary: Orthopaedic Surgery

## 2019-09-02 ENCOUNTER — Encounter: Admit: 2019-09-02 | Discharge: 2019-09-02 | Payer: MEDICARE

## 2019-09-02 DIAGNOSIS — M79601 Pain in right arm: Secondary | ICD-10-CM

## 2019-09-02 DIAGNOSIS — F17200 Nicotine dependence, unspecified, uncomplicated: Principal | ICD-10-CM

## 2019-09-02 DIAGNOSIS — M79602 Pain in left arm: Principal | ICD-10-CM

## 2019-09-02 DIAGNOSIS — G9520 Unspecified cord compression: Principal | ICD-10-CM

## 2019-09-02 DIAGNOSIS — F329 Major depressive disorder, single episode, unspecified: Principal | ICD-10-CM

## 2019-09-02 DIAGNOSIS — M4722 Other spondylosis with radiculopathy, cervical region: Principal | ICD-10-CM

## 2019-09-02 DIAGNOSIS — G959 Disease of spinal cord, unspecified: Principal | ICD-10-CM

## 2019-09-02 DIAGNOSIS — I708 Atherosclerosis of other arteries: Principal | ICD-10-CM

## 2019-09-02 DIAGNOSIS — J438 Other emphysema: Principal | ICD-10-CM

## 2019-09-02 DIAGNOSIS — Z01818 Encounter for other preprocedural examination: Principal | ICD-10-CM

## 2019-09-02 DIAGNOSIS — L4050 Arthropathic psoriasis, unspecified: Principal | ICD-10-CM

## 2019-09-02 LAB — BASIC METABOLIC PANEL
ANION GAP: 5 mmol/L — ABNORMAL LOW (ref 7–15)
BLOOD UREA NITROGEN: 11 mg/dL (ref 7–21)
BUN / CREAT RATIO: 13
CALCIUM: 9.6 mg/dL (ref 8.5–10.2)
CHLORIDE: 107 mmol/L (ref 98–107)
CO2: 28 mmol/L (ref 22.0–30.0)
EGFR CKD-EPI NON-AA FEMALE: 79 mL/min/{1.73_m2} (ref >=60)
GLUCOSE RANDOM: 107 mg/dL (ref 70–179)
POTASSIUM: 4.1 mmol/L (ref 3.5–5.0)
SODIUM: 140 mmol/L (ref 135–145)

## 2019-09-02 LAB — CBC
HEMATOCRIT: 48 % — ABNORMAL HIGH (ref 36.0–46.0)
HEMOGLOBIN: 15.1 g/dL (ref 12.0–16.0)
MEAN CORPUSCULAR HEMOGLOBIN CONC: 31.4 g/dL (ref 31.0–37.0)
MEAN CORPUSCULAR HEMOGLOBIN: 32.2 pg (ref 26.0–34.0)
MEAN CORPUSCULAR VOLUME: 102.6 fL — ABNORMAL HIGH (ref 80.0–100.0)
MEAN PLATELET VOLUME: 8.6 fL (ref 7.0–10.0)
PLATELET COUNT: 238 10*9/L (ref 150–440)
RED BLOOD CELL COUNT: 4.69 10*12/L (ref 4.00–5.20)
RED CELL DISTRIBUTION WIDTH: 15.6 % — ABNORMAL HIGH (ref 12.0–15.0)

## 2019-09-02 LAB — SODIUM: Sodium:SCnc:Pt:Ser/Plas:Qn:: 140

## 2019-09-02 LAB — APTT: Coagulation surface induced:Time:Pt:PPP:Qn:Coag: 39.9 — ABNORMAL HIGH

## 2019-09-02 LAB — MEAN CORPUSCULAR HEMOGLOBIN: Erythrocyte mean corpuscular hemoglobin:EntMass:Pt:RBC:Qn:Automated count: 32.2

## 2019-09-02 LAB — PROTIME: Coagulation tissue factor induced:Time:Pt:PPP:Qn:Coag: 12.2

## 2019-09-02 LAB — PROTIME-INR: PROTIME: 12.2 s (ref 10.5–13.5)

## 2019-09-02 MED ORDER — MUPIROCIN 2 % TOPICAL OINTMENT
Freq: Two times a day (BID) | TOPICAL | 0 refills | 0 days | Status: CP
Start: 2019-09-02 — End: 2019-09-05

## 2019-09-02 MED ORDER — CHLORHEXIDINE GLUCONATE 4 % TOPICAL LIQUID
Freq: Two times a day (BID) | TOPICAL | 0 refills | 0 days | Status: CP
Start: 2019-09-02 — End: 2019-09-05

## 2019-09-02 MED FILL — OTEZLA STARTER 10 MG (4)-20 MG (4)-30 MG(47) TABLETS IN A DOSE PACK: 28 days supply | Qty: 1 | Fill #0 | Status: AC
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
## 2019-09-02 NOTE — Unmapped (Signed)
Addendum  created 09/02/19 1544 by Bryn Gulling, ANP    Clinical Note Signed

## 2019-09-02 NOTE — Unmapped (Addendum)
Your diagnosis: Cervical stenosis     Recommendations/Plan  ?? Medications: Take over-the-counter medications as needed and as tolerated  ?? If you don't have liver disease, the safest medication for pain is acetaminophen (Tylenol).  Take 1000mg  (2 extra strength tabs) at a time for pain relief.  Do not take more than 3000mg  per day.  ?? Activity: Activities as tolerated.  ?? Conservative Care: Let's continue to monitor your symptoms.  ?? To prevent falls, remove small loose rugs, keep hallway lights on, and install grab bars.  ?? Contact our Artist at [phone].  ?? Surgical Care: You are a candidate for surgery. (C3-6/7 anterior cervical decompression/corpectomy and instrumented fusion with cages/allograft)  ?? Avoid blood thinners (such as fish oil, ibuprofen, Motrin, Advil, Aleve, aspirin, Goody powder, BC powder, Eliquis, Plavix, and warfarin) for 7-10 days before surgery  ?? Surgery has been scheduled for 09/22/19  ?? Anterior Cervical Fusion ... [email or web address]  ?? Imaging studies: Cervical AP and lateral views (prior to next visit).  ?? Follow-up: At 10-14 days after surgery.    Stop methotrexate 1 week prior to surgery. Will resume 1 week after surgery.  Do not start taking Otezla until 2 weeks after surgery.    BACTROBAN OINTMENT: Apply a small amount to the inside of each nostril. Pinch nose to distribute every 12 hours for the 3 days prior to surgery.  CHLORHEXIDINE SOAP: Leave on 2-3 minutes before rinsing, use in daily showers  X 3 days ahead of surgery and the morning of surgery. Do not use any products on your body after you start using Chlorhexidine soap- no lotion,deoderant, powder, cream, ointment, perfume, body spray or other products.    You will need to have PreOp lab work drawn today.    PRE-SURGICAL INFORMATION & CHECKLIST    THE WEEK BEFORE YOUR SURGERY 1. You will bleed less during surgery if you stop taking anti-inflammatory medications at least one week before surgery.  These include ASPIRIN, IBUPROFEN (Motrin, Advil), NAPROXYN (Naprosyn), NAPROXEN SODIUM (Aleve), INDOMETHACIN (Indocin), CELEBREX and several other prescription anti-inflammatories commonly prescribed for arthritis or other musculoskeletal conditions. Discontinue FISH OIL supplements as well.  Ask your doctor before discontinuing any medication.   2. If you normally shave the area of surgery, please stop shaving one week (or more) before surgery. Germs grow in small razor nicks from shaving and will increase the risk of infection.     THE LAST BUSINESS DAY BEFORE YOUR SURGERY    1. Surgery times are given out from 2:00-5:00 p.m. on the last business day before scheduled surgery.  A preoperative nurse will call you during this time to confirm when you should report for your surgery.  If you have not been contacted by 5:00 p.m., you may call our pre-care department: [facility] at [phone]; [facility] at [phone], [facility] [phone].  2. If you will not be staying at your home address the night before surgery, please call the Ambulatory Procedure Center to let them know where you can be reached.     THE NIGHT BEFORE YOUR SURGERY    1. DO NOT EAT OR DRINK ANYTHING AFTER MIDNIGHT, unless instructed otherwise.  2. Take your usual medications as prescribed, unless instructed otherwise.   3. Shower with antibacterial soap the night before and the morning of surgery.     THE MORNING OF YOUR SURGERY     1. Take a shower or bath and wash the surgical area with the antibacterial soap you have  been given in the clinic.   2. Remember NOT to bring your TED hose to the hospital. These will be needed at home after surgery.    3. Remember to bring any crutches, braces or other equipment that have been supplied for use immediately following your surgery. 4. You may take your usual medications as prescribed with a small sip of water, unless instructed otherwise.  Do not drink a full glass of water.      A RESPONSIBLE ADULT (OVER 18) MUST ACCOMPANY YOU ON THE DAY OF SURGERY AND BE AVAILABLE THROUGHOUT YOUR PROCEDURE IN THE EVENT THERE ARE QUESTIONS OR COMPLICATIONS.  You must have an adult available to take you home following your procedure as it will not be safe for you to drive or take public transportation alone.        Contact our team electronically via MyChart message to [facility] Clinical Staff.    Contact our team at [phone] or [phone] with any questions/concerns.

## 2019-09-03 NOTE — Unmapped (Signed)
I, Nemiah Commander, MD, saw and evaluated the patient, participating in the key elements of the service.  I discussed the findings, assessment and plan with the resident and agree with resident???s findings and plan as documented in the resident's note.

## 2019-09-06 ENCOUNTER — Ambulatory Visit (INDEPENDENT_AMBULATORY_CARE_PROVIDER_SITE_OTHER): Payer: Medicare Other | Admitting: Family Medicine

## 2019-09-06 ENCOUNTER — Other Ambulatory Visit: Payer: Self-pay

## 2019-09-06 ENCOUNTER — Encounter: Payer: Self-pay | Admitting: Family Medicine

## 2019-09-06 ENCOUNTER — Encounter

## 2019-09-06 VITALS — Ht 64.0 in | Wt 152.0 lb

## 2019-09-06 DIAGNOSIS — J069 Acute upper respiratory infection, unspecified: Secondary | ICD-10-CM | POA: Diagnosis not present

## 2019-09-06 MED ORDER — GUAIFENESIN ER 600 MG PO TB12: 600.0000 mg | ORAL_TABLET | Freq: Two times a day (BID) | ORAL | 0 refills | Status: DC | PRN

## 2019-09-06 MED ORDER — DOXYCYCLINE HYCLATE 100 MG PO TABS: 100.0000 mg | ORAL_TABLET | Freq: Two times a day (BID) | ORAL | 0 refills | Status: DC

## 2019-09-06 NOTE — Progress Notes (Signed)
Ht 5\' 4"  (1.626 m)   Wt 152 lb (68.9 kg)   BMI 26.09 kg/m    Subjective:    Patient ID: Emily Johnston, female    DOB: 07-Dec-1960, 58 y.o.   MRN: [id number]  HPI: Emily Johnston is a 58 y.o. female  Chief Complaint  Patient presents with  . Cough    x a week. ears discomfort  . Nasal Congestion  . Headache  . Diarrhea    . This visit was completed via WebEx due to the restrictions of the COVID-19 pandemic. All issues as above were discussed and addressed. Physical exam was done as above through visual confirmation on WebEx. If it was felt that the patient should be evaluated in the office, they were directed there. The patient verbally consented to this visit. . Location of the patient: home . Location of the provider: home . Those involved with this call:  . Provider: Merrie Roof, PA-C . CMA: Lesle Chris, [place] . Front Desk/Registration: Jill Side  . Time spent on call: 15 minutes with patient face to face via video conference. More than 50% of this time was spent in counseling and coordination of care. 5 minutes total spent in review of patient's record and preparation of their chart. I verified patient identity using two factors (patient name and date of birth). Patient consents verbally to being seen via telemedicine visit today.   Presenting today with 1 week of productive cough, ear pain, congestion, headache, fatigue, and diarrhea. Denies fever, chills, wheezing, CP, SOB. Has been around some sick contacts. Trying OTC remedies with minimal relief. Hx of COPD compliant with inhaler regimen.   Relevant past medical, surgical, family and social history reviewed and updated as indicated. Interim medical history since our last visit reviewed. Allergies and medications reviewed and updated.  Review of Systems  Per HPI unless specifically indicated above     Objective:    Ht 5\' 4"  (1.626 m)   Wt 152 lb (68.9 kg)   BMI 26.09 kg/m   Wt Readings from  Last 3 Encounters:  09/06/19 152 lb (68.9 kg)  06/09/19 134 lb (60.8 kg)  06/01/19 136 lb (61.7 kg)    Physical Exam Vitals and nursing note reviewed.  Constitutional:      General: She is not in acute distress.    Appearance: Normal appearance.  HENT:     Head: Atraumatic.     Right Ear: External ear normal.     Left Ear: External ear normal.     Nose: Congestion present.     Mouth/Throat:     Mouth: Mucous membranes are moist.     Pharynx: Oropharynx is clear. Posterior oropharyngeal erythema present.  Eyes:     Extraocular Movements: Extraocular movements intact.     Conjunctiva/sclera: Conjunctivae normal.  Cardiovascular:     Comments: Unable to assess via virtual visit Pulmonary:     Effort: Pulmonary effort is normal. No respiratory distress.  Musculoskeletal:        General: Normal range of motion.     Cervical back: Normal range of motion.  Skin:    General: Skin is dry.     Findings: No erythema.  Neurological:     Mental Status: She is alert and oriented to person, place, and time.  Psychiatric:        Mood and Affect: Mood normal.        Thought Content: Thought content normal.  Judgment: Judgment normal.     Results for orders placed or performed in visit on 06/01/19  Comprehensive metabolic panel  Result Value Ref Range   Glucose 82 65 - 99 mg/dL   BUN 10 6 - 24 mg/dL   Creatinine, Ser 0.94 0.57 - 1.00 mg/dL   GFR calc non Af Amer 68 >59 mL/min/1.73   GFR calc Af Amer 78 >59 mL/min/1.73   BUN/Creatinine Ratio 11 9 - 23   Sodium 146 (H) 134 - 144 mmol/L   Potassium 4.3 3.5 - 5.2 mmol/L   Chloride 108 (H) 96 - 106 mmol/L   CO2 24 20 - 29 mmol/L   Calcium 9.1 8.7 - 10.2 mg/dL   Total Protein 6.5 6.0 - 8.5 g/dL   Albumin 4.1 3.8 - 4.9 g/dL   Globulin, Total 2.4 1.5 - 4.5 g/dL   Albumin/Globulin Ratio 1.7 1.2 - 2.2   Bilirubin Total <0.2 0.0 - 1.2 mg/dL   Alkaline Phosphatase 101 39 - 117 IU/L   AST 20 0 - 40 IU/L   ALT 12 0 - 32 IU/L  CBC  with Differential/Platelet  Result Value Ref Range   WBC 8.1 3.4 - 10.8 x10E3/uL   RBC 4.05 3.77 - 5.28 x10E6/uL   Hemoglobin 13.5 11.1 - 15.9 g/dL   Hematocrit 39.4 34.0 - 46.6 %   MCV 97 79 - 97 fL   MCH 33.3 (H) 26.6 - 33.0 pg   MCHC 34.3 31.5 - 35.7 g/dL   RDW 13.3 11.7 - 15.4 %   Platelets 326 150 - 450 x10E3/uL   Neutrophils 54 Not Estab. %   Lymphs 33 Not Estab. %   Monocytes 8 Not Estab. %   Eos 4 Not Estab. %   Basos 1 Not Estab. %   Neutrophils Absolute 4.4 1.4 - 7.0 x10E3/uL   Lymphocytes Absolute 2.7 0.7 - 3.1 x10E3/uL   Monocytes Absolute 0.6 0.1 - 0.9 x10E3/uL   EOS (ABSOLUTE) 0.3 0.0 - 0.4 x10E3/uL   Basophils Absolute 0.1 0.0 - 0.2 x10E3/uL   Immature Granulocytes 0 Not Estab. %   Immature Grans (Abs) 0.0 0.0 - 0.1 x10E3/uL  Lipid Panel w/o Chol/HDL Ratio  Result Value Ref Range   Cholesterol, Total 216 (H) 100 - 199 mg/dL   Triglycerides 108 0 - 149 mg/dL   HDL 60 >39 mg/dL   VLDL Cholesterol Cal 19 5 - 40 mg/dL   LDL Chol Calc (NIH) 137 (H) 0 - 99 mg/dL  Cytology - PAP  Result Value Ref Range   High risk HPV Negative    Adequacy      Satisfactory for evaluation; transformation zone component ABSENT.   Diagnosis      - Negative for intraepithelial lesion or malignancy (NILM)   Molecular Disclaimer      APTIMA HPV assay on the Panther system is an in vitro nucleic acid   Molecular Disclaimer      amplification test for the qualitative detection of E6/E7 viral   Molecular Disclaimer      messenger RNA of High-Risk HPV types 16, 18, 31, 33, 35, 39, 45, 51, 52,   Molecular Disclaimer      56, 58, 59, 66,  68.  This test was validated and its appropriate   Molecular Disclaimer      performance characteristics determined by the reporting laboratory.   Games developer was performed at [facility]. This laboratory is   [employer] fast streamer  certified under the 123XX123 as qualified to perform high complexity   Molecular  Disclaimer      clinical laboratory testing. Normal Reference Range-Not Detected      Assessment & Plan:   Problem List Items Addressed This Visit    None    Visit Diagnoses    Upper respiratory tract infection, unspecified type    -  Primary   Recommended COVID testing/isolation for r/o, but consistent with sinusitis. Tx with doxycycline, mucinex, inhaler regimen. F/u if not improving       Follow up plan: Return if symptoms worsen or fail to improve.

## 2019-09-10 ENCOUNTER — Encounter: Admit: 2019-09-10 | Discharge: 2019-09-11 | Disposition: A | Discharge status: Home / Self Care | Payer: MEDICARE

## 2019-09-10 DIAGNOSIS — H109 Unspecified conjunctivitis: Principal | ICD-10-CM

## 2019-09-10 DIAGNOSIS — L309 Dermatitis, unspecified: Principal | ICD-10-CM

## 2019-09-10 LAB — COMPREHENSIVE METABOLIC PANEL
ALBUMIN: 3.7 g/dL (ref 3.5–5.0)
ALKALINE PHOSPHATASE: 88 U/L (ref 38–126)
ALT (SGPT): 16 U/L (ref <35)
ANION GAP: 5 mmol/L — ABNORMAL LOW (ref 7–15)
AST (SGOT): 27 U/L (ref 14–38)
BLOOD UREA NITROGEN: 12 mg/dL (ref 7–21)
BUN / CREAT RATIO: 19
CALCIUM: 9.1 mg/dL (ref 8.5–10.2)
CHLORIDE: 106 mmol/L (ref 98–107)
CO2: 29 mmol/L (ref 22.0–30.0)
CREATININE: 0.63 mg/dL (ref 0.60–1.00)
EGFR CKD-EPI NON-AA FEMALE: >90 mL/min/{1.73_m2} (ref >=60)
GLUCOSE RANDOM: 93 mg/dL (ref 70–179)
POTASSIUM: 4.1 mmol/L (ref 3.5–5.0)
PROTEIN TOTAL: 6.2 g/dL — ABNORMAL LOW (ref 6.5–8.3)
SODIUM: 140 mmol/L (ref 135–145)

## 2019-09-10 LAB — CBC W/ AUTO DIFF
BASOPHILS ABSOLUTE COUNT: 0.1 10*9/L (ref 0.0–0.1)
BASOPHILS RELATIVE PERCENT: 1 %
EOSINOPHILS ABSOLUTE COUNT: 0.5 10*9/L (ref 0.0–0.7)
EOSINOPHILS RELATIVE PERCENT: 5.9 %
HEMATOCRIT: 41.9 % (ref 35.0–44.0)
HEMOGLOBIN: 14.3 g/dL (ref 12.0–15.5)
LYMPHOCYTES ABSOLUTE COUNT: 1.8 10*9/L (ref 0.7–4.0)
LYMPHOCYTES RELATIVE PERCENT: 23.6 %
MEAN CORPUSCULAR HEMOGLOBIN CONC: 34.1 g/dL (ref 30.0–36.0)
MEAN CORPUSCULAR HEMOGLOBIN: 32.9 pg (ref 26.0–34.0)
MEAN CORPUSCULAR VOLUME: 96.3 fL (ref 82.0–98.0)
MEAN PLATELET VOLUME: 8.3 fL (ref 7.0–10.0)
MONOCYTES ABSOLUTE COUNT: 0.6 10*9/L (ref 0.1–1.0)
MONOCYTES RELATIVE PERCENT: 7.6 %
NEUTROPHILS ABSOLUTE COUNT: 4.7 10*9/L (ref 1.7–7.7)
PLATELET COUNT: 252 10*9/L (ref 150–450)
RED BLOOD CELL COUNT: 4.35 10*12/L (ref 3.90–5.03)
WBC ADJUSTED: 7.6 10*9/L (ref 3.5–10.5)

## 2019-09-10 LAB — ERYTHROCYTE SEDIMENTATION RATE: Lab: 15

## 2019-09-10 LAB — EOSINOPHILS RELATIVE PERCENT: Eosinophils/100 leukocytes:NFr:Pt:Bld:Qn:Automated count: 5.9

## 2019-09-10 LAB — EGFR CKD-EPI NON-AA FEMALE: Lab: >90

## 2019-09-10 MED ORDER — KETOTIFEN 0.025 % (0.035 %) EYE DROPS
Freq: Two times a day (BID) | OPHTHALMIC | 0 refills | 50.00000 days | Status: CP
Start: 2019-09-10 — End: ?

## 2019-09-10 MED ORDER — HYDROCORTISONE 2.5 % TOPICAL OINTMENT
Freq: Two times a day (BID) | TOPICAL | 1 refills | 0 days | Status: CP
Start: 2019-09-10 — End: 2020-09-09

## 2019-09-10 MED ORDER — PREDNISONE 20 MG TABLET
ORAL_TABLET | 0 refills | 0 days | Status: CP
Start: 2019-09-10 — End: ?

## 2019-09-10 MED ADMIN — acetaminophen (TYLENOL) tablet 650 mg: 650 mg | ORAL | Stop: 2019-09-10

## 2019-09-10 MED ADMIN — proparacaine (ALCAINE) 0.5 % ophthalmic solution 2 drop: 2 [drp] | OPHTHALMIC | @ 21:00:00 | Stop: 2019-09-10

## 2019-09-10 MED ADMIN — fluorescein ophthalmic strip 1 strip: 1 | OPHTHALMIC | @ 21:00:00 | Stop: 2019-09-10

## 2019-09-10 NOTE — Unmapped (Addendum)
Pt ambulatory to triage c/o rash to her scalp and back of her neck following getting her hair died two weeks ago. Pt reports h/o psoriatic arthritis. Pt reports also swelling and redness to her R eye and forehead now.     Pt was seen by dermatologist and started on steroids and antibiotics without improvement two weeks ago.

## 2019-09-10 NOTE — Unmapped (Signed)
[facility] Emergency Department Provider Note        CHIEF COMPLAINT: Rash    HPI:   Michele Miller is a 58 y.o. female with a PMH of psoriasis, COPD, and HLD presenting to the ED for a rash and eye irritation.  [name] reports that her psoriasis has been flaring up for the last couple of months.  She saw her dermatologist in [place] 3 weeks ago and she reports that she was started on oral antibiotic, a 7 or 10-day prednisone taper, and given some ointment.  She has not no the names or doses of the medications.  She reports that this morning she noticed right eye redness and irritation and white discharge.  She also feels like her left eye is getting irritated as well.  She has had new itchy lesions on her neck that are typical of prior outbreaks of her psoriasis.  She denies eye injury.  She has not had any recent infectious symptoms.  She denies photophobia, fever, chills, cough, sore throat, chest pain, shortness of breath, nausea, vomiting, diarrhea, COVID-19 exposure.  She denies eye trauma.      ROS: See HPI  Constitutional: no fever   Eyes: no new visual changes  ENT: no trouble breathing or swallowing    Cardiovascular:  no chest pain   Resp: no SOB   GI: no vomiting or dh  GU: no dysuria  Integumentary: positive rash   Allergy: no hives   Musculoskeletal: no leg swelling   Neurological: no slurred speech  10 point ROS otherwise negative except as otherwise documented    PAST MEDICAL HISTORY/PAST SURGICAL HISTORY:   Past Medical History:   Diagnosis Date   ??? Aortic atherosclerosis (CMS-HCC)    ??? Baker's cyst    ??? COPD (chronic obstructive pulmonary disease) (CMS-HCC)    ??? CTS (carpal tunnel syndrome)    ??? Depression    ??? Headache(784.0)    ??? HLD (hyperlipidemia)    ??? Joint pain    ??? Psoriasis    ??? Psoriatic arthritis (CMS-HCC)    ??? PTSD (post-traumatic stress disorder)    ??? Scoliosis    ??? Tobacco use        MEDICATIONS:   Prior to Admission medications Medication Sig Start Date End Date Taking? Authorizing Provider   albuterol HFA 90 mcg/actuation inhaler Inhale 2 puffs by mouth every six (6) hours as needed for wheezing. 08/26/18 09/02/19  Ivery Quale, MD   apremilast (OTEZLA) 30 mg Tab Take 1 tablet (30 mg total) by mouth Two (2) times a day. 08/15/19   Tomasita Crumble, MD   apremilast 10 mg (4)-20 mg (4)-30 mg (47) DsPk Take by mouth as directed on package labeling.  Goal dose is 30 mg twice daily. 08/08/19   Tomasita Crumble, MD   cetirizine (ZYRTEC) 10 MG tablet Take 1 tablet (10 mg total) by mouth daily. 10/25/18   Soyla Murphy, MD   chlorhexidine (HIBICLENS) 4 % external liquid Apply topically two (2) times a day for 6 doses. Apply topically two (2) times a day for 3 days. Scrub body and operative site for 10 minutes during shower/bath twice-a-day for 3 days before surgery (Most importantly, the evening before and morning of surgery). ??Leave on 2-3 minutes before rinsing. 09/02/19 09/05/19  Marthenia Rolling, MD   clobetasoL (TEMOVATE) 0.05 % cream Apply topically Two (2) times a day. 04/18/19 04/17/20  Staci Righter, PA   clobetasoL (TEMOVATE) 0.05 % ointment  Apply topically Two (2) times a day. 06/06/19 06/05/20  Amy Lily Kocher, MD   clonazePAM (KLONOPIN) 0.5 MG tablet Take 0.5 mg by mouth Three (3) times a day.    Historical Provider, MD   diclofenac sodium (VOLTAREN) 1 % gel Apply 2 grams topically to small joint and 4 grams to large joints as needed up to 4 times daily 08/05/18   Corena Pilgrim, MD   dicyclomine (BENTYL) 10 mg capsule Take 2 capsules (20 mg total) by mouth 4 (four) times a day as needed. 01/11/19   Soyla Murphy, MD   DULoxetine (CYMBALTA) 60 MG capsule Take 2 capsules (120 mg total) by mouth daily. 04/08/19 04/07/20  Soyla Murphy, MD   empty container (BD SHARPS COLLECTOR) Misc Use as directed. 11/17/18   Geoffry Paradise, CPP fluticasone propionate (FLONASE) 50 mcg/actuation nasal spray Use 2 sprays in each nostril daily. 03/21/19 03/20/20  Soyla Murphy, MD   folic acid (FOLVITE) 1 MG tablet Take 1 tablet (1 mg total) by mouth daily. 08/23/19 08/22/20  Staci Righter, PA   gabapentin (NEURONTIN) 300 MG capsule Take 1 capsule (300 mg total) by mouth Three (3) times a day. 08/23/19 08/22/20  Soyla Murphy, MD   meclizine (ANTIVERT) 12.5 mg tablet  03/22/19   Historical Provider, MD   methotrexate 25 mg/mL injection solution Inject 1 mL (25 mg total) under the skin once a week. 06/10/19   Staci Righter, PA   mirtazapine (REMERON) 15 MG tablet  06/24/19   Historical Provider, MD   mupirocin (BACTROBAN) 2 % ointment Apply topically Two (2) times a day for 6 doses. Apply topically Two (2) times a day for 3 days. Use Q-tip to apply a small amount to the inside of both nostrils, twice a day, for the 3 days prior to surgery (including AM of surgery). Pinch nose to distribute evenly. 09/02/19 09/05/19  Marthenia Rolling, MD   peg-electrolyte soln (GOLYTELY) 420 gram SolR Take as directed 11/24/18   Luanne Bras, MD   polyethylene glycol [facility]) 17 gram packet Mix the contents of 1 packet (17 g) in 4-8 ounces of water, juice, soda, coffee, or tea and take by mouth daily for 5 days, then use as needed 11/01/18   [facility] Alroy Dust, MD   STIOLTO RESPIMAT 2.5-2.5 mcg/actuation Mist INL 2 PFS PO ITL D 03/22/19   Historical Provider, MD   syringe with needle 1 mL 27 x 1/2 Syrg Use as directed for weekly Methotrexate injections 01/27/19   Corena Pilgrim, MD   tiotropium bromide (SPIRIVA RESPIMAT) 1.25 mcg/actuation Mist Inhale 2 puffs (2.5 mcg) BY MOUTH daily. 10/25/18   Soyla Murphy, MD       ALLERGIES:   No Known Allergies    SOCIAL HISTORY:   Social History     Tobacco Use   ??? Smoking status: Current Every Day Smoker     Packs/day: 1.50     Years: 20.00     Pack years: 30.00     Types: Cigarettes     Start date: 01/02/1974 ??? Smokeless tobacco: Never Used   Substance Use Topics   ??? Alcohol use: No     Alcohol/week: 0.0 standard drinks       FAMILY HISTORY:  Family History   Problem Relation Age of Onset   ??? COPD Mother    ??? Osteoporosis Mother    ??? Hypertension Father    ??? No Known Problems Sister    ???  No Known Problems Daughter    ??? No Known Problems Maternal Grandmother    ??? No Known Problems Maternal Grandfather    ??? No Known Problems Paternal Grandmother    ??? No Known Problems Paternal Grandfather    ??? Anesthesia problems Neg Hx    ??? Broken bones Neg Hx    ??? Cancer Neg Hx    ??? Clotting disorder Neg Hx    ??? Collagen disease Neg Hx    ??? Diabetes Neg Hx    ??? Dislocations Neg Hx    ??? Fibromyalgia Neg Hx    ??? Gout Neg Hx    ??? Hemophilia Neg Hx    ??? Rheumatologic disease Neg Hx    ??? Scoliosis Neg Hx    ??? Severe sprains Neg Hx    ??? Sickle cell anemia Neg Hx    ??? Spinal Compression Fracture Neg Hx    ??? BRCA 1/2 Neg Hx    ??? Breast cancer Neg Hx    ??? Colon cancer Neg Hx    ??? Endometrial cancer Neg Hx    ??? Ovarian cancer Neg Hx    ??? Melanoma Neg Hx    ??? Basal cell carcinoma Neg Hx    ??? Squamous cell carcinoma Neg Hx        EXAM:    CONSTITUTIONAL: Alert and oriented and responds appropriately to questions. Well-appearing; well-nourished  HEAD: Normocephalic  EYES: Pupils equal round reactive to light, extraocular muscles intact, right conjunctival injection, anterior chambers clear, no corneal lesions, normal fluorescein exam  ENT: normal nose; no rhinorrhea; moist mucous membranes; pharynx without lesions noted  NECK: Supple   CARD: RRR; no murmurs, no clicks, no rubs, no gallops  RESP: Normal chest excursion without splinting or tachypnea; breath sounds clear and equal bilaterally; no wheezes, no rhonchi, no rales,   ABD/GI: Normal bowel sounds; non-distended; soft, non-tender, no rebound, no guarding  BACK:  The back appears normal and is non-tender to palpation, there is no CVA tenderness EXT: Normal ROM in all joints; non-tender to palpation;  no edema     SKIN: Red papules on the neck and besides her forehead.  There are some erythema mild edema around the right eye.              NEURO: Moves all extremities. Speech normal.   PSYCH: The patient's mood and manner are appropriate. Grooming and personal hygiene are appropriate. Pt does not express HI or SI.        ED PROGRESS/MDM:   6:48 PM  The labs are back and look good.  I have paged ophthalmology awaiting her back from them.  [name] reports that she has been seen by a dermatologist who has dx'ed her with psoriasis and trialed her on a course of prednisone.  She is frustrated that they have not made her better.  She is also frustrated that we are unable to have a dermatologist see her in the ED here today.  Unfortunately do not have dermatology available for consult to the ED.  After speaking ophthalmology I plan to page dermatology to discuss with them.  I have reviewed the old records. I independently reviewed  Xrays and EKG.  7:17 PM  I spoke with the ophthalmology resident, Dr Clare Gandy.  They looked at the images and we discussed her clinical picture.  They recommended that we give her cool compresses, artificial tears, and that we give her Zaditor eye drops. They will contact  her Monday to arrange a f/u exam in the optho clinic.   7:36 PM  I spoke with Dr. Lazaro Arms, from dermatology.  He saw [name] in the dermatology clinic.  He will get the pictures of we discussed the case.  He recommends that we put her on a 12-day prednisone taper.  Also give her a prescription for 2.5% hydrocortisone cream to apply to her rash on her face and neck twice a day.  They will contact her to set up a appointment for repeat evaluation.    Documentation assistance was provided by Rosine Beat, Scribe on September 10, 2019 at 3:38 PM for Elesa Hacker, MD.        Wendelyn Breslow, MD  09/10/19 [phone]

## 2019-09-12 DIAGNOSIS — G9520 Unspecified cord compression: Principal | ICD-10-CM

## 2019-09-12 DIAGNOSIS — M79602 Pain in left arm: Principal | ICD-10-CM

## 2019-09-12 DIAGNOSIS — G959 Disease of spinal cord, unspecified: Principal | ICD-10-CM

## 2019-09-12 DIAGNOSIS — M79601 Pain in right arm: Principal | ICD-10-CM

## 2019-09-12 MED ORDER — MUPIROCIN 2 % TOPICAL OINTMENT
Freq: Two times a day (BID) | TOPICAL | 0 refills | 3.00000 days | Status: CP
Start: 2019-09-12 — End: 2019-09-15
  Filled 2019-09-13: qty 22, 3d supply, fill #0

## 2019-09-12 NOTE — Unmapped (Signed)
-----   Message from Tomasita Crumble, MD sent at 09/10/2019  7:28 PM EST -----  Hi team,    Can we contact patient and offer next available in [place] (resident clinic if available but first available slot) for an ED follow-up?     Thanks    Alinda Money

## 2019-09-13 MED FILL — MUPIROCIN 2 % TOPICAL OINTMENT: 3 days supply | Qty: 22 | Fill #0 | Status: AC

## 2019-09-14 NOTE — Unmapped (Signed)
Attempted to call patient to tell her to not start taking her Otexla until her incision is healed. Not able to leave a message.

## 2019-09-14 NOTE — Unmapped (Signed)
-----   Message from Nemiah Commander, MD sent at 09/02/2019  3:19 PM EST -----  Regarding: RE: Henderson [place]  Thank Raquel.    I think she should not take prior to surgery.    If you're able, can you contact her?    We will also call her.      Dear [facility],  Can you please call Mr. R to tell her not to start Henderson [place] until surgery incision is healed?    -Drue Stager  ----- Message -----  From: Bryn Gulling, ANP  Sent: 09/02/2019   2:55 PM EST  To: Nemiah Commander, MD  Subject: Henderson [place]                                           Dr. Jaynie Collins,    I just saw Ms. Monceaux in [place]. She states that she is supposed to start [facility] for her psoriatic arthritis in the next day or so. Is this ok for her to take prior to surgery?     Regards,    Raquel

## 2019-09-15 ENCOUNTER — Encounter: Admit: 2019-09-15 | Discharge: 2019-09-16 | Payer: MEDICARE | Attending: Dermatology | Primary: Dermatology

## 2019-09-15 DIAGNOSIS — L409 Psoriasis, unspecified: Principal | ICD-10-CM

## 2019-09-15 NOTE — Unmapped (Signed)
Use a moisturizer cream, such as Excipial Urea hand cream, Neutrogena [place] Hand Cream, Amlactin Ultra cream, or thick Vaseline if able.     We will follow up on light box.    Start [place] when you're cleared to do so by surgery

## 2019-09-18 NOTE — Unmapped (Signed)
DERMATOLOGY CLINIC NOTE      A/P:    Hand dermatitis in pt with hx psoriatic arthritis, flaring on maximum dose methotrexate  - on MTX 25mg  weekly for PsA per rheum; no hx cutaneous psoriasis  - DDx of psoriasis vs ACD of hands   - patient has responded to short courses of pred in past  - has [place], but waiting until post-op (neck surgery) to start  - has not received home nbUVB    - will f/ u to determine status  - discussed if not improved on above regimen, may discuss w/ rheum re: switching to biologic  - in interim, plan to continue topical clobetasol BID with occlusion under cotton gloves at night. Discussed with patient continued avoidance of irritants by using gloves for housework and wet work  - recs provided for moisturizing hand creams      Return in about 6 weeks (around 10/27/2019). or sooner as needed      CC:   Chief Complaint   Patient presents with   ??? Rash     neck and face x few month no tx   ??? hand dermatitis     no improvement on topicals        HPI:  59 y.o. year old female seen today in follow up for hand dermatitis. Last seen 07/2019.    She continues to have hand dermatitis, initially suspected ACD but subsequently more c/w PsO which has persisted despite MTX 25 (for PsA) and high potency topical steroids. She has additionally had flares of rash on face, neck, seen in ED on 12/26, and treated w/ pred taper.     At [facility], she was Rx'd [place] and home nbUVB. She has received [place], but has been instructed by surgeon to hold until 1-2 weeks after surgery (neck surgery). She will also hold MTX perioperatively. She has not received nbUVB unit    No other new or changing lesions or areas of concern.     Pertinent PMH: No history of skin cancer    Patient Active Problem List   Diagnosis   ??? Depressive disorder   ??? Psoriasis   ??? Tobacco use disorder   ??? Carpal tunnel syndrome   ??? Anxiety   ??? Acute stress reaction   ??? Grief at loss of child   ??? Trigger thumb of right hand   ??? Benzodiazepine misuse   ??? COPD (chronic obstructive pulmonary disease) (CMS-HCC)   ??? Chronic pain   ??? Osteoarthritis cervical spine   ??? GI bleed   ??? Epicondylitis, lateral   ??? Trochanteric bursitis of right hip   ??? Chronic idiopathic constipation   ??? PTSD (post-traumatic stress disorder)     Current Outpatient Medications   Medication Instructions   ??? albuterol HFA 90 mcg/actuation inhaler Inhale 2 puffs by mouth every six (6) hours as needed for wheezing.   ??? apremilast 10 mg (4)-20 mg (4)-30 mg (47) DsPk Take by mouth as directed on package labeling.  Goal dose is 30 mg twice daily.   ??? cetirizine (ZYRTEC) 10 mg, Oral, Daily (standard)   ??? clobetasoL (TEMOVATE) 0.05 % cream Topical, 2 times a day (standard)   ??? clobetasoL (TEMOVATE) 0.05 % ointment Topical, 2 times a day (standard)   ??? clonazePAM (KLONOPIN) 0.5 mg, Oral, 3 times a day (standard)   ??? CYMBALTA 120 mg, Oral, Daily (standard)   ??? diclofenac sodium (VOLTAREN) 1 % gel Apply 2 grams topically to small joint and  4 grams to large joints as needed up to 4 times daily   ??? dicyclomine (BENTYL) 20 mg, Oral, 4 times daily PRN   ??? empty container (BD SHARPS COLLECTOR) Misc Use as directed.   ??? fluticasone propionate (FLONASE) 50 mcg/actuation nasal spray Use 2 sprays in each nostril daily.   ??? folic acid (FOLVITE) 1 mg, Oral, Daily (standard)   ??? gabapentin (NEURONTIN) 300 mg, Oral, 3 times a day (standard)   ??? hydrocortisone 2.5 % ointment 1 application, Topical, 2 times a day (standard), Apply in a thin layer to rash. Avoid getting in your eye.   ??? ketotifen (ZADITOR) 0.025 % (0.035 %) ophthalmic solution 1 drop, Both Eyes, 2 times a day (standard)   ??? meclizine (ANTIVERT) 12.5 mg tablet No dose, route, or frequency recorded.   ??? methotrexate 25 mg, Subcutaneous, Weekly   ??? mirtazapine (REMERON) 15 MG tablet No dose, route, or frequency recorded.   ??? OTEZLA 30 mg, Oral, 2 times a day (standard)   ??? peg-electrolyte soln (GOLYTELY) 420 gram SolR Take as directed   ??? polyethylene glycol (MIRALAX) 17 gram packet Mix the contents of 1 packet (17 g) in 4-8 ounces of water, juice, soda, coffee, or tea and take by mouth daily for 5 days, then use as needed   ??? predniSONE (DELTASONE) 20 MG tablet 3 po daily for 4 days, then 2 po daily for 4 days, then 1 po daily for 4 days   ??? STIOLTO RESPIMAT 2.5-2.5 mcg/actuation Mist INL 2 PFS PO ITL D   ??? syringe with needle 1 mL 27 x 1/2 Syrg Use as directed for weekly Methotrexate injections   ??? tiotropium bromide (SPIRIVA RESPIMAT) 1.25 mcg/actuation Mist Inhale 2 puffs (2.5 mcg) BY MOUTH daily.       SH: Lives in [place] [address]    ROS: all others negative    PE:  General: Well-developed, well-nourished, in no acute distress  Neuro: Alert and oriented, interacts appropriately  Skin:  Focal examination of face, neck, chest, BUE performed today and is pertinent for:  - fissuring and hyperkeratosis of hands  - review of prior photos shows erythematous scaly papules on posterior neck and face, none on exam today

## 2019-09-20 NOTE — Unmapped (Signed)
rx and letter for hand box sent to [facility]

## 2019-09-20 NOTE — Unmapped (Signed)
COVID Pre-Procedure Intake Form     Assessment     Michele Miller is a 59 y.o. female presenting to [facility] for COVID testing.     Plan     If no testing performed, pt counseled on routine care for respiratory illness.  If testing performed, COVID sent.  Patient directed to Home given findings during today's visit.    Subjective     Michele Miller is a 59 y.o. female who [facility] with complaints of the following:    Exposure History: In the last 21 days?     Have you traveled outside of [place]? No               Have you been in close contact with someone confirmed by a test to have COVID? (Close contact is within 6 feet for at least 10 minutes) No       Have you worked in a health care facility? No     Lived or worked facility like a nursing home, group home, or assisted living?    No         Are you scheduled to have surgery or a procedure in the next 3 days? Yes               Are you scheduled to receive cancer chemotherapy within the next 7 days?    No     Have you ever been tested before for COVID-19 with a swab of your nose? No   Are you a healthcare worker being tested so to return to work No     Right now,  do you have any of the following that developed over the past 7 days (as stated by patient on intake form):    Subjective fever (felt feverish) No   Chills (especially repeated shaking chills) No   Severe fatigue (felt very tired) No   Muscle aches No   Runny nose No   Sore throat No   Loss of taste or smell No   Cough (new onset or worsening of chronic cough) No   Shortness of breath No   Nausea or vomiting No   Headache No   Abdominal Pain No   Diarrhea (3 or more loose stools in last 24 hours) No Scribe's Attestation: Paulita Fujita, FNP obtained and performed the history, physical exam and medical decision making elements that were entered into the chart.  Signed by Edison Simon serving as Scribe, on 09/20/2019 11:51 AM      The documentation recorded by the scribe accurately reflects the service I personally performed and the decisions made by me. Aida Puffer, FNP  September 20, 2019 5:56 PM

## 2019-09-21 NOTE — Unmapped (Signed)
[facility] Hospitalist Consult Note    Requesting Physician:  Nemiah Commander, MD of Orthopedics [facility])  Primary Care Physician: Marjie Skiff, AGNP      Assessment/Recommendations:  Cervical spondylosis with myelopathy and radiculopathy    Principal Problem:    Cervical spondylosis with myelopathy and radiculopathy  Active Problems:    Depressive disorder    Anxiety    COPD (chronic obstructive pulmonary disease) (CMS-HCC)    PTSD (post-traumatic stress disorder)    Myelomalacia of cervical cord (CMS-HCC)  Resolved Problems:    * No resolved hospital problems. *         *Admitted for ACDF   - Pain control, DVT prophylaxis deferred to primary team (Orthopedics).  - PT/OT per primary team.  - Wound care, precautions, weight-bearing deferred to primary orthopedic team    Psoriatic arthritis and dermatitis  Follows with rheumatology  Has been on methotrexate, discussed with orthopedic surgery when to resume  She has a prescription for [facility], but has not started it yet.  She is waiting until postop to start.  Topical clobetasol  Has not been taking prednisone prior to surgery.    COPD  We will order as needed albuterol  Spiriva or formulary equivalent  Encourage smoking cessation    Aortic atherosclerosis  Seen incidentally on CT scan  Recommend following up with primary care physician after recovery from surgery  Resume aspirin as soon as possible.  Patient is not yet taking a statin.  Smoking cessation.    Tobacco use  Encourage smoking cessation    Anxiety/depression/PTSD  Continue to follow with outpatient psychiatry and therapy  Clonazepam -she takes this 3 times a day.  We will resume starting tomorrow given increased sedating medications and patient's level of sedation.  Continue duloxetine    Hyperlipidemia    No results found for: A1C    Labs reviewed   Patient's home medications reviewed and medical reconciliation performed      - Admission medication reconciliation completed. Thank you for this consult.  We will continue to follow.      History of Present Illness:   Reason for Consult: Michele Miller is seen at the request of Dr. Jaynie Collins for medical management.       History of Present Illness:  Michele Miller is 59 y.o. female with a PMH of cervical spine spondylosis, psoriatic arthritis and dermatitis, COPD, aortic atherosclerosis, tobacco use, anxiety, depression, PTSD admitted for ACDF on 09/22/2019.  Patient continues to have persistent pain despite nonoperative measures. The decision was made to take the patient to surgery.  Denies recent fevers, chills, CP, DOE, edema, n/v/d.  Patient seen and examined preop, pain is controlled and vital signs are stable.   No history of bleeding or clotting disorders.     Allergies:   No Known Allergies    Home Medications:  Prior to Admission medications    Medication Dose, Route, Frequency   albuterol HFA 90 mcg/actuation inhaler Inhale 2 puffs by mouth every six (6) hours as needed for wheezing.   cetirizine (ZYRTEC) 10 MG tablet 10 mg, Oral, Daily (standard)   clonazePAM (KLONOPIN) 0.5 MG tablet 0.5 mg, Oral, 3 times a day (standard)   dicyclomine (BENTYL) 10 mg capsule 20 mg, Oral, 4 times daily PRN   DULoxetine (CYMBALTA) 60 MG capsule 120 mg, Oral, Daily (standard)   folic acid (FOLVITE) 1 MG tablet 1 mg, Oral, Daily (standard)   gabapentin (NEURONTIN) 300 MG capsule 300 mg, Oral,  3 times a day (standard)   meclizine (ANTIVERT) 12.5 mg tablet No dose, route, or frequency recorded.   methotrexate 25 mg/mL injection solution 25 mg, Subcutaneous, Weekly   polyethylene glycol (MIRALAX) 17 gram packet Mix the contents of 1 packet (17 g) in 4-8 ounces of water, juice, soda, coffee, or tea and take by mouth daily for 5 days, then use as needed   predniSONE (DELTASONE) 20 MG tablet 3 po daily for 4 days, then 2 po daily for 4 days, then 1 po daily for 4 days   STIOLTO RESPIMAT 2.5-2.5 mcg/actuation Mist INL 2 PFS PO ITL D tiotropium bromide (SPIRIVA RESPIMAT) 1.25 mcg/actuation Mist Inhale 2 puffs (2.5 mcg) BY MOUTH daily.   traZODone (DESYREL) 50 MG tablet 50 mg, Oral, Nightly   apremilast (OTEZLA) 30 mg Tab 30 mg, Oral, 2 times a day (standard)   apremilast 10 mg (4)-20 mg (4)-30 mg (47) DsPk Take by mouth as directed on package labeling.  Goal dose is 30 mg twice daily.   clobetasoL (TEMOVATE) 0.05 % cream Topical, 2 times a day (standard)   clobetasoL (TEMOVATE) 0.05 % ointment Topical, 2 times a day (standard)   diclofenac sodium (VOLTAREN) 1 % gel Apply 2 grams topically to small joint and 4 grams to large joints as needed up to 4 times daily   empty container (BD SHARPS COLLECTOR) Misc Use as directed.   fluticasone propionate (FLONASE) 50 mcg/actuation nasal spray Use 2 sprays in each nostril daily.  Patient not taking: Reported on 09/22/2019   hydrocortisone 2.5 % ointment 1 application, Topical, 2 times a day (standard), Apply in a thin layer to rash. Avoid getting in your eye.   ketotifen (ZADITOR) 0.025 % (0.035 %) ophthalmic solution 1 drop, Both Eyes, 2 times a day (standard)   mirtazapine (REMERON) 15 MG tablet No dose, route, or frequency recorded.   peg-electrolyte soln (GOLYTELY) 420 gram SolR Take as directed   syringe with needle 1 mL 27 x 1/2 Syrg Use as directed for weekly Methotrexate injections           Medical History:   Past Medical History:   Diagnosis Date   ??? Aortic atherosclerosis (CMS-HCC)    ??? Baker's cyst    ??? COPD (chronic obstructive pulmonary disease) (CMS-HCC)    ??? CTS (carpal tunnel syndrome)    ??? Depression    ??? Headache(784.0)    ??? HLD (hyperlipidemia)    ??? Joint pain    ??? Myelomalacia of cervical cord (CMS-HCC) 09/22/2019   ??? Psoriasis    ??? Psoriatic arthritis (CMS-HCC)    ??? PTSD (post-traumatic stress disorder)    ??? Scoliosis    ??? Tobacco use        Surgical History:   Past Surgical History:   Procedure Laterality Date   ??? CARPAL TUNNEL RELEASE     ??? COLONOSCOPY     ??? HYSTERECTOMY For endometriosis, ovaries still in place   ??? KNEE SURGERY     ??? PR REVISE MEDIAN N/CARPAL TUNNEL SURG Right 02/13/2014    Procedure: NEUROPLASTY AND/OR TRANSPOSITION; MEDIAN NERVE AT CARPAL TUNNEL;  Surgeon: Abram Sander, MD;  Location: [facility] OR [facility];  Service: Orthopedics       Social History:    Social History     Tobacco Use   ??? Smoking status: Current Every Day Smoker     Packs/day: 0.75     Years: 20.00     Pack years: 15.00  Types: Cigarettes     Start date: 01/02/1974   ??? Smokeless tobacco: Never Used   Substance Use Topics   ??? Alcohol use: No     Alcohol/week: 0.0 standard drinks     Social History     Socioeconomic History   ??? Marital status: Single     Spouse name: Not on file   ??? Number of children: Not on file   ??? Years of education: Not on file   ??? Highest education level: Not on file   Occupational History   ??? Occupation: Glass blower/designer   Social Needs   ??? Financial resource strain: Not hard at all   ??? Food insecurity     Worry: Patient refused     Inability: Patient refused   ??? Transportation needs     Medical: Patient refused     Non-medical: Patient refused   Tobacco Use   ??? Smoking status: Current Every Day Smoker     Packs/day: 0.75     Years: 20.00     Pack years: 15.00     Types: Cigarettes     Start date: 01/02/1974   ??? Smokeless tobacco: Never Used   Substance and Sexual Activity   ??? Alcohol use: No     Alcohol/week: 0.0 standard drinks   ??? Drug use: No   ??? Sexual activity: Not on file   Lifestyle   ??? Physical activity     Days per week: Not on file     Minutes per session: Not on file   ??? Stress: Not on file   Relationships   ??? Social Wellsite geologist on phone: Not on file     Gets together: Not on file     Attends religious service: Not on file     Active member of club or organization: Not on file     Attends meetings of clubs or organizations: Not on file     Relationship status: Not on file   Other Topics Concern   ??? Exercise No   ??? Living Situation No   ??? Do you use sunscreen? Yes ??? Tanning bed use? No   ??? Are you easily burned? No   ??? Excessive sun exposure? No   ??? Blistering sunburns? No   Social History Narrative   ??? Not on file       Family History:   Family History   Problem Relation Age of Onset   ??? COPD Mother    ??? Osteoporosis Mother    ??? Hypertension Father    ??? No Known Problems Sister    ??? No Known Problems Daughter    ??? No Known Problems Maternal Grandmother    ??? No Known Problems Maternal Grandfather    ??? No Known Problems Paternal Grandmother    ??? No Known Problems Paternal Grandfather    ??? Anesthesia problems Neg Hx    ??? Broken bones Neg Hx    ??? Cancer Neg Hx    ??? Clotting disorder Neg Hx    ??? Collagen disease Neg Hx    ??? Diabetes Neg Hx    ??? Dislocations Neg Hx    ??? Fibromyalgia Neg Hx    ??? Gout Neg Hx    ??? Hemophilia Neg Hx    ??? Rheumatologic disease Neg Hx    ??? Scoliosis Neg Hx    ??? Severe sprains Neg Hx    ??? Sickle cell anemia Neg Hx    ???  Spinal Compression Fracture Neg Hx    ??? BRCA 1/2 Neg Hx    ??? Breast cancer Neg Hx    ??? Colon cancer Neg Hx    ??? Endometrial cancer Neg Hx    ??? Ovarian cancer Neg Hx    ??? Melanoma Neg Hx    ??? Basal cell carcinoma Neg Hx    ??? Squamous cell carcinoma Neg Hx        Code Status: Full Code    Review of Systems: 10-system review negative unless noted otherwise above.    Objective:    Physical Exam  Vitals (last 24 hrs): Temp:  [36.1 ??C (97 ??F)-37.1 ??C (98.8 ??F)] 37 ??C (98.6 ??F)  Heart Rate:  [59-90] 78  SpO2 Pulse:  [78-90] 78  Resp:  [10-18] 10  BP: (100-135)/(68-121) 100/77  SpO2:  [91 %-98 %] 95 %   General: Lying in bed, appears comfortable.    Eyes: Anicteric sclerae, no conjunctival injection.   ENT: Mucous membranes moist.   Respiratory: Clear to auscultation bilaterally.   Cardiovascular: Regular rate and rhythm without murmurs.  DP pulses 2+ bilaterally.   Psychiatric: Alert, oriented.  Answers questions appropriately.       Imaging:  Fl Fluoro < 60 Minutes (no Interp)    Result Date: 09/22/2019 Fluoroscopy was rendered to the performing Physician. The procedure will be resulted in HIM.      Laboratory Data:  Lab Results   Component Value Date    WBC 7.6 09/10/2019    HGB 14.3 09/10/2019    HCT 41.9 09/10/2019    PLT 252 09/10/2019       Lab Results   Component Value Date    NA 140 09/10/2019    K 4.1 09/10/2019    CL 106 09/10/2019    CO2 29.0 09/10/2019    BUN 12 09/10/2019    CREATININE 0.63 09/10/2019    GLU 93 09/10/2019    CALCIUM 9.1 09/10/2019    MG 1.9 10/22/2012    PHOS 4.2 10/22/2012       Lab Results   Component Value Date    BILITOT 0.5 09/10/2019    BILIDIR <0.10 01/18/2016    PROT 6.2 (L) 09/10/2019    ALBUMIN 3.7 09/10/2019    ALT 16 09/10/2019    AST 27 09/10/2019    ALKPHOS 88 09/10/2019    GGT 38 10/22/2012       Lab Results   Component Value Date    INR 1.03 09/02/2019    APTT 39.9 (H) 09/02/2019

## 2019-09-21 NOTE — Unmapped (Signed)
Per Michele Miller's chart, she will delay starting Otezla until after her next surgery. She has a follow up with Orthopedics on 1/22. Will move pharmacist check-in call to then to see if she is cleared to start medication.    Michele Miller A. Katrinka Blazing, PharmD, BCPS - Pharmacist   [facility]

## 2019-09-22 ENCOUNTER — Ambulatory Visit
Admit: 2019-09-22 | Discharge: 2019-09-24 | Disposition: A | Discharge status: Home Health | Payer: MEDICARE | Attending: Family | Admitting: Orthopaedic Surgery

## 2019-09-22 ENCOUNTER — Ambulatory Visit
Admit: 2019-09-22 | Discharge: 2019-09-24 | Disposition: A | Discharge status: Home Health | Payer: MEDICARE | Admitting: Orthopaedic Surgery

## 2019-09-22 ENCOUNTER — Encounter
Admit: 2019-09-22 | Discharge: 2019-09-24 | Disposition: A | Discharge status: Home Health | Payer: MEDICARE | Admitting: Orthopaedic Surgery

## 2019-09-22 NOTE — Unmapped (Signed)
ORTHOPAEDIC SPINE SERVICE       Nemiah Commander, MD  Associate Professor  [email or web address]  [phone]        Patient Name:Michele Miller  MRN: [id number]  DOB: [birth year]    Operative Note  Date of Surgery: 09/22/2019  Admit Date: 09/22/2019  Attending Physician: Nemiah Commander, MD    Preoperative Diagnosis: Cervical myelopathy and myelomalacia    Patient Active Problem List   Diagnosis   ??? Depressive disorder   ??? Psoriasis   ??? Tobacco use disorder   ??? Carpal tunnel syndrome   ??? Anxiety   ??? Acute stress reaction   ??? Grief at loss of child   ??? Trigger thumb of right hand   ??? Benzodiazepine misuse   ??? COPD (chronic obstructive pulmonary disease) (CMS-HCC)   ??? Chronic pain   ??? Cervical spondylosis with myelopathy and radiculopathy   ??? GI bleed   ??? Epicondylitis, lateral   ??? Trochanteric bursitis of right hip   ??? Chronic idiopathic constipation   ??? PTSD (post-traumatic stress disorder)   ??? Myelomalacia of cervical cord (CMS-HCC)     Postoperative Diagnosis: Same    Procedure:  1) C3-4, C4-5, C5-6 and C6-7 anterior cervical decompression and fusion (41324, 22552x3)  2) C3-7 instrumentation (40102)  3) Interbody cages x 4 (72536U4)  4) Local bone graft and allograft (20930, 20936)       Resident Physician(s): Dia Sitter, MD    Anesthesia: General    Estimated Blood Loss: 50 mL    Complications: None    Specimens: None    Indications for Surgery:        Operative Findings:  Exuberant osteophytes.  Bony oozing from osteophytes controlled.  The plate is radiographically prominent at C3 but is underneath the anterior longitudinal ligament and flush with the soft tissues.  PLL was taken partially at C4-5 on the right, and preserved at all the other levels. Procedure: Patient was brought to the operating room by the Anesthesia team. She was intubated. Lines were placed. A Foley was placed. SCDs were placed. Spinal cord signal baselines were established. The baseline signals were stable.  The neck was gently extended. The shoulders were taped down. Spinal cord signals were rechecked and unchanged. The anterior cervical spine was then prepped and draped in the usual sterile surgical fashion.     We localized our incision using lateral fluoroscopy. Antibiotics were given.  Surgical timeout was called.  A left horizontal incision was made approximately at C4-5.  The platysma was divided and flaps were raised. The medial border of the sternocleidomastoid was identified. Dissection was then carried deeply and medially.  The omohyoid was identified.  The carotid pulse pulse was palpated and blunt dissection was performed medial to the pulse to the prevertebral fascia. The anterior cervical spine was readily accessed.  The C4-5 level was then confirmed with lateral fluoroscopy.  The longus colli were elevated off the bodies of C3 to C6.  The Shadowline retractors were then placed, with the blades underneath the longus colli.  The endotracheal tube cuff was deflated and reinflated. The wound was irrigated.     Attention was then turned to discectomy and decompression at C3-4. An initial discectomy was performed.  Two Caspar pins were placed and distracted. Spinal cord signals were unchanged. Bone quality was good. A complete discectomy was performed.  There was essentially no remaining disc material. The cartilage from the endplates and uncinates were curetted off.  The posterior  osteophytes were resected with a bur to decompress the cord.  The annulus was completely resected.  The posterior uncus was resected bilaterally to decompress the nerve roots. The PLL was preserved. The wound was copiously irrigated. Attention was then turned to arthrodesis and cage insertion. The posterior endplates were decorticated. The disc space was trialed.   The uncovertebral joints were packed with demineralized bone matrix.  We placed a 4mm standard DePuy Bengal cage.  The cage was filled with local bone graft from osteophyte shavings as well as demineralized bone matrix. The cage was stable. The wound was copiously irrigated.  Demineralized bone matrix was packed bilaterally. Spinal cord signals were unchanged.    Attention was then directed to C4-5 discectomy and decompression. The retractor was repositioned and the ET cuff was deflated/re-inflated.  An initial discectomy was performed.  Caspar pins were placed and distracted. Spinal cord signals were unchanged.  A complete discectomy was performed.  The disc was severely degenerated.  The cartilage from the endplates and uncinates were curetted off. The posterior osteophytes were resected with a bur to decompress the cord.  The annulus was completely resected.  The posterior uncus was resected bilaterally to decompress the nerve roots.  The PLL was taken on the right but preserved centrally on on the left. The wound was copiously irrigated.     Attention was then turned to arthrodesis and cage insertion. The posterior endplates were again decorticated. The disc space was trialed.   The uncovertebral joints were packed with demineralized bone matrix.  We placed another 4mm DePuy Bengal cage.  The cage was filled with local bone graft from osteophyte shavings as well as demineralized bone matrix. The cage was stable. The wound was copiously irrigated.  Demineralized bone matrix was packed bilaterally. Spinal cord signals were unchanged. Attention was then turned to discectomy and decompression at C5-6. The retractor was again repositioned.  The ET tube cuff was deflated and re-inflated.   An initial discectomy was performed.  Caspar pins were placed and distracted. Spinal cord signals were unchanged. A complete discectomy was performed. The posterior osteophytes were resected with a bur to decompress the cord.  There was copious bleeding from the resected osteophytes.  This was serially controlled with Floseal.  The annulus was completely resected.  The posterior uncus was resected bilaterally to decompress the nerve roots.  The PLL was preserved.  The wound was copiously irrigated.     Attention was then turned to arthrodesis and cage insertion. The posterior endplates were decorticated.  The burr was used to reach behind the C5 and C6 bodies to further the decompression. The disc space was trialed.   The uncovertebral joints were packed with demineralized bone matrix.  We placed a 5mm large Synthes ProTi ACIS cage.  The cage was filled with local bone graft from osteophyte shavings as well as demineralized bone matrix. The cage was stable. The wound was copiously irrigated.  Demineralized bone matrix was packed bilaterally. Spinal cord signals were unchanged.    At this point, we opted to included the C6-7 level in the fusion.  The longus colli was elevated off the C6 and C7 bodies.  The retractor was replaced.  An initial discectomy was performed.  Caspar pins were placed and distracted. Spinal cord signals were unchanged. A complete discectomy was performed.  The cartilage from the endplates and uncinates were curetted off. The posterior osteophytes were resected with a burr.  The PLL was preserved.  The wound was copiously  irrigated. Attention was then turned to arthrodesis and cage insertion. The posterior endplates were decorticated. The disc space was trialed.   The uncovertebral joints were packed with demineralized bone matrix.  We placed a 5mm standard Synthes ProTi ACIS} cage.  The cage was filled with local bone graft from osteophyte shavings as well as demineralized bone matrix. The cage was stable. The wound was copiously irrigated.  Demineralized bone matrix was packed bilaterally. Spinal cord signals were unchanged.    Attention was turned to instrumentation. We chose a 64mm DePuy Skyline plate. This was affixed to C3, C4, C5, C6 and C7 using 14mm screws.  Lateral fluoroscopic guidance was used. The screws were fixed in all positions.  Screw purchase was overall good. The locking mechanism was deployed.     Final radiographs revealed satisfactory position of the implants. Hemostasis was achieved and excellent. This was triple checked with packing.  The esophagus and carotid sheath were inspected and found to be free of any visible injury. A deep drain was placed. The platysma was reapproximated with 2-0 Vicryl. The skin was closed with 3-0 Vicryl and steri-strips.     Post-Op Plan/Instructions: We will plan admitting the patient.  We will monitor for dysphagia/dehydration, airway compromise/re-intubation, and neurologic compromise.  Head of bed elevated 60 degrees.  Sequential compression devices and TEDs for mechanical DVT prophylaxis.  Oral analgesia.  Discontinue Foley.  IV antibiotics for 23 hours.  Follow-up in clinic in 10 to 14 days for wound check.  Per patient's request, I contacted her brother by phone after surgery.    Attending Surgeon Attestation: Cira Servant, MD, was present, scrubbed for and directed the entire operation from positioning to closure.     Nemiah Commander   Date: 09/22/2019

## 2019-09-22 NOTE — Unmapped (Addendum)
Pt received from PACU. Drowsy but oriented. VSS on 2L O2. Pt dropped to 86% O2 on room air. Pt is a current smoker with COPD. Pain was 6/10 upon arrival and then increased to 8/10. IV morphine was given since oxycodone was given at 1318 in PACU. Pt has weak bilateral grip but reports full sensation. Strong dorsi and plantar flexion BLE with full sensation and 2+ dorsalis pedis pulses. Pt denies nausea. Pt is stable. Care transferred to [facility] [place].     **Pt reports that she has had multiple falls at home r/t balance issues.       Problem: Adult Inpatient Plan of Care  Goal: Plan of Care Review  Outcome: Ongoing - Unchanged  Goal: Patient-Specific Goal (Individualization)  Outcome: Ongoing - Unchanged  Goal: Absence of Hospital-Acquired Illness or Injury  Outcome: Ongoing - Unchanged  Goal: Optimal Comfort and Wellbeing  Outcome: Ongoing - Unchanged  Goal: Readiness for Transition of Care  Outcome: Ongoing - Unchanged  Goal: Rounds/Family Conference  Outcome: Ongoing - Unchanged     Problem: Fall Injury Risk  Goal: Absence of Fall and Fall-Related Injury  Outcome: Ongoing - Unchanged     Problem: Bleeding (Spinal Surgery)  Goal: Absence of Bleeding  Outcome: Ongoing - Unchanged     Problem: Bowel Elimination Impaired (Spinal Surgery)  Goal: Effective Bowel Elimination  Outcome: Ongoing - Unchanged     Problem: Functional Ability Impaired (Spinal Surgery)  Goal: Optimal Functional Ability  Outcome: Ongoing - Unchanged     Problem: Infection (Spinal Surgery)  Goal: Absence of Infection Signs/Symptoms  Outcome: Ongoing - Unchanged     Problem: Neurologic Impairment (Spinal Surgery)  Goal: Optimal Neurologic Function  Outcome: Ongoing - Unchanged     Problem: Ongoing Anesthesia Effects (Spinal Surgery)  Goal: Anesthesia/Sedation Recovery  Outcome: Ongoing - Unchanged     Problem: Pain (Spinal Surgery)  Goal: Acceptable Pain Control  Outcome: Ongoing - Unchanged Problem: Postoperative Nausea and Vomiting (Spinal Surgery)  Goal: Nausea and Vomiting Relief  Outcome: Ongoing - Unchanged     Problem: Postoperative Urinary Retention (Spinal Surgery)  Goal: Effective Urinary Elimination  Outcome: Ongoing - Unchanged     Problem: Wound  Goal: Optimal Wound Healing  Outcome: Ongoing - Unchanged     Problem: COPD Comorbidity  Goal: Maintenance of COPD Symptom Control  Outcome: Ongoing - Unchanged

## 2019-09-22 NOTE — Unmapped (Signed)
Discharge Summary      Admit date: 09/22/2019    Discharge date and time: 09/24/19 9:56 AM    Discharge to:  Home    Discharge Service: Orthopedics [facility])    Discharge Attending Physician: Nemiah Commander, MD    Discharge  Diagnoses: cervical stenosis s/p C3-7 ACDF    Secondary Diagnosis: Principal Problem:    Cervical spondylosis with myelopathy and radiculopathy  Active Problems:    Depressive disorder    Anxiety    COPD (chronic obstructive pulmonary disease) (CMS-HCC)    PTSD (post-traumatic stress disorder)    Myelomalacia of cervical cord (CMS-HCC)      OR Procedures:    ARTHRODES, ANT INTRBDY, INCL DISC SPC PREP, DISCECT, OSTEOPHYT/DECOMPRESS SPINL CRD &/OR NRV RT, CRV BLO C2  ARTHROD, ANT INTBDY, INCL DISC SPC PREP/DISCTMY/OSTEPHYT/DECMPR SPNL CRD/NRV RT; CERV BELO C2, EA ADD`L SPC  VERTEBRAL CORPECTOMY-ANT W/DECOMP; CERV 1 SEGMT  ARTHRODESIS, ANTERIOR INTERBODY TECHNIQUE, INCLUDE MINIMAL DISKECTOMY TO PREP INTERSPACE; CERVICAL BELOW C2  ARTHRODESIS, ANTERIOR INTERBODY TECHNIQUE, INCLUD MINIMAL DISKECTOMY TO PREP INTERSPAC; EA ADD`L INTERSPACE CERVICAL  ANT INSTRUM; 4 TO 7 VERTEB SEGMT CERVICAL  INSERT INTERBODY BIOMECHANICAL DEVICE(S) WITH INTEGRAL ANTERIOR INSTRUMENT FOR DEVICE ANCHORING, WHEN PERFORMED, TO INTERVERTEBRAL DISC SPACE IN CONJUNCTION WITH INTERBODY ARTHRODESIS, EACH INTERSPACE x2  INSERT INTERVERTEBRAL BIOMECHANICAL DEVICE(S) W INTEGRAL ANTERIOR INSTRUMENT FOR ANCHORING, WHEN PERFORMED, TO VERT CORPECTOMY(IES) DEFECT, IN CONJUNCTION W INTERBODY ARTHRODESIS, EACH CONTIG DEFECT  AUTOGRAFT/SPINE SURG ONLY (W/HARVEST GRAFT); LOCAL (EG, RIB/SPINOUS PROC, LAM FRGMT) OBTAIN FROM SAME INCIS  ALLOGRAFT FOR SPINE SURGERY ONLY; MORSELIZED  CONTINUOUS INTRAOPERATIVE NEUROPHYSIOLOGY MONITORING IN OR  Date  09/22/2019  -------------------     Ancillary Procedures: no procedures    Discharge Day Services:     Subjective   No acute events overnight. Pain Controlled.  Does report mild right-sided neck pain that does not radiate to her extremities.  Denies new numbness or tingling in her extremities.  Walked 125 feet with physical therapy yesterday.  Does express some anxiety surrounding discharge home given lack of caregivers for assistance.  Encouraged her that she will have home health physical therapy to assist.  Denies any difficulty with speech or swallow.  Denies any fevers, chills, shortness of breath, chest pain.  No additional concerns today.    Objective   Patient Vitals for the past 8 hrs:   BP Temp Temp src Pulse Resp SpO2   09/24/19 0603 143/86 ??? ??? ??? ??? ???   09/24/19 0504 164/85 36.5 ??C Temporal 63 18 93 %     No intake/output data recorded.    Physical Exam:  General:  Alert and oriented, no acute distress    Spine:  Dressing clean, dry, intact.  Strength 5/5 and sensation intact to light touch in C3-T1 distributions.  Fingers warm and well perfused.        Hospital Course:  Patient was admitted postoperatively. Diet was advanced which was well tolerated. Patient was evaluated by physical therapy and was deemed appropriate for discharge. Foley was discontinued prior discharge and patient was voiding freely. Pain was well controlled on PO medication. Patient will follow up back in clinic as scheduled. Patient discharge on POD2 in stable condition with no complication.       Condition at Discharge: Improved  Discharge Medications:      Your Medication List      START taking these medications    docusate sodium 100 MG capsule  Commonly known as: COLACE  Take 1 capsule (  100 mg total) by mouth Two (2) times a day for 14 days.     methocarbamoL 750 MG tablet  Commonly known as: ROBAXIN  Take 1 tablet (750 mg total) by mouth Three (3) times a day for 7 days.     NARCAN 4 mg/actuation nasal spray  Generic drug: naloxone  One spray in either nostril once for known/suspected opioid overdose. May repeat every 2-3 minutes in alternating nostril til EMS arrives     oxyCODONE 5 MG immediate release tablet  Commonly known as: ROXICODONE  Take 1-2 tablets (5-10 mg total) by mouth every six (6) hours as needed for pain for up to 5 days.        CHANGE how you take these medications    acetaminophen 500 MG tablet  Commonly known as: TYLENOL EXTRA STRENGTH  Take 2 tablets (1,000 mg total) by mouth every eight (8) hours as needed for pain for up to 14 days.  What changed:   ?? how much to take  ?? when to take this     clobetasoL 0.05 % ointment  Commonly known as: TEMOVATE  Apply topically Two (2) times a day.  What changed: Another medication with the same name was removed. Continue taking this medication, and follow the directions you see here.     polyethylene glycol 17 gram packet  Commonly known as: MIRALAX  Mix the contents of 1 packet (17 g) in 4-8 ounces of water, juice, soda, coffee, or tea and take by mouth daily for 5 days, then use as needed  What changed:   ?? when to take this  ?? reasons to take this        CONTINUE taking these medications    albuterol 90 mcg/actuation inhaler  Commonly known as: PROVENTIL HFA;VENTOLIN HFA  Inhale 2 puffs by mouth every six (6) hours as needed for wheezing.     BD TUBERCULIN SYRINGE 1 mL 27 x 1/2 Syrg  Generic drug: syringe with needle  Use as directed for weekly Methotrexate injections     cetirizine 10 MG tablet  Commonly known as: ZyrTEC  Take 1 tablet (10 mg total) by mouth daily.     clonazePAM 0.5 MG tablet  Commonly known as: KlonoPIN  Take 0.5 mg by mouth Three (3) times a day.     CYMBALTA 60 MG capsule  Generic drug: DULoxetine  Take 2 capsules (120 mg total) by mouth daily.     diclofenac sodium 1 % gel  Commonly known as: VOLTAREN  Apply 2 grams topically to small joint and 4 grams to large joints as needed up to 4 times daily     dicyclomine 10 mg capsule  Commonly known as: BENTYL  Take 2 capsules (20 mg total) by mouth 4 (four) times a day as needed.     empty container Misc  Commonly known as: BD SHARPS COLLECTOR  Use as directed.     fluticasone propionate 50 mcg/actuation nasal spray  Commonly known as: FLONASE  Use 2 sprays in each nostril daily.     folic acid 1 MG tablet  Commonly known as: FOLVITE  Take 1 tablet (1 mg total) by mouth daily.     gabapentin 300 MG capsule  Commonly known as: NEURONTIN  Take 1 capsule (300 mg total) by mouth Three (3) times a day.     hydrocortisone 2.5 % ointment  Apply 1 application topically Two (2) times a day. Apply in a thin layer to rash.  Avoid getting in your eye.     ketotifen 0.025 % (0.035 %) ophthalmic solution  Commonly known as: ZADITOR  Administer 1 drop to both eyes Two (2) times a day.     meclizine 12.5 mg tablet  Commonly known as: ANTIVERT  Take 12.5 mg by mouth Three (3) times a day as needed.     methotrexate 25 mg/mL injection solution  Inject 1 mL (25 mg total) under the skin once a week.     multivitamin with minerals tablet  Take 1 tablet by mouth daily.     OTEZLA STARTER 10 mg (4)-20 mg (4)-30 mg (47) Dspk  Generic drug: apremilast  Take by mouth as directed on package labeling.  Goal dose is 30 mg twice daily.     OTEZLA 30 mg Tab  Generic drug: apremilast  Take 1 tablet (30 mg total) by mouth Two (2) times a day.     senna 8.6 mg tablet  Commonly known as: SENOKOT  Take 1-2 tablets by mouth daily as needed for constipation.     SPIRIVA RESPIMAT 1.25 mcg/actuation Mist  Generic drug: tiotropium bromide  Inhale 2 puffs (2.5 mcg) BY MOUTH daily.     STIOLTO RESPIMAT 2.5-2.5 mcg/actuation Mist  Generic drug: tiotropium-olodateroL  Inhale 2 puffs daily.     traZODone 50 MG tablet  Commonly known as: DESYREL  Take 50 mg by mouth nightly.        ASK your doctor about these medications    chlorhexidine 4 % external liquid  Commonly known as: HIBICLENS  Apply topically two (2) times a day for 6 doses. Apply topically two (2) times a day for 3 days. Scrub body and operative site for 10 minutes during shower/bath twice-a-day for 3 days before surgery (Most importantly, the evening before and morning of surgery). ??Leave on 2-3 minutes before rinsing.  Ask about: Should I take this medication?     mupirocin 2 % ointment  Commonly known as: BACTROBAN  Apply topically Two (2) times a day for 3 days. Use Q-tip to apply a small amount to the inside of both nostrils, twice a day, for the 3 days prior to surgery (including the morning of surgery). Pinch nose to distribute evenly.  Ask about: Should I take this medication?             Pending Test Results:  None    Discharge Instructions:      Other Instructions     Discharge instructions      Nemiah Commander, MD  Discharge Instructions - Anterior Cervical Fusion    MEDICATIONS  Please remember per discussion with your rheumatologist, we are holding methotrexate for psoriatic arthritis for 1 week postop. Additionally, we are holding Ootezla at least until your follow up appointment with Dr. Jaynie Collins.    YOUR COLLAR  You have been given 2 collars to wear postoperatively.  You must wear the soft collar at all times until seen in the office for your first post-operative visit.  The second brown collar is for showering.      SHOWERING  You may remove the outer dressings 72hrs after your surgery.  Leave the small white steri-strips in place.  You may shower once the bandages are removed.  Hair washing is permissible while in the shower. No soaking in a tub until seen in the office.      EXERCISE  You may walk or climb stairs as tolerated.  Walking frequently after surgery will help  prevent blood clots.  Do NOT lift anything weighing greater than about 10 lbs.  (A gallon container of water weighs about 8.5 lbs)  Try to avoid lifting or reaching above your head.     INCISION  Please make sure your incisions are checked at least once daily for signs and symptoms of infection.  If any of the below should occur, please call us.  Drainage from incision site  Opening of incision  Increased redness and/or tenderness   Fevers greater than 101F  You have sutures on your incision that need to be removed 10-14 days following your surgery.    Keep the white steri-strips over the incision intact until the wound check visit.    SLEEPING  You may sleep in any position which makes you comfortable as long as your collar is securely in place.  Many patients find comfort sleeping in a recliner chair.  It is normal to have difficulty sleeping for the first several weeks following your surgery.  We recommend trying over-the-counter Benadryl or Tylenol PM if needed.      EATING  It is normal to have a sore throat and some difficulty swallowing dry solid foods after anterior cervical surgery.  This may persist for several weeks.   Eating soft foods like yogurt, ice cream and mashed potatoes can be more comfortable.    PAIN  Take the prescribed pain medications only if you are having pain.  Try to minimize the amount taken.  Do NOT take any anti-inflammatory medications (ibuprofen, Advil, Aleve, Motrin, etc.) for the first 10 weeks following your surgery.  These medications can prevent the spinal fusion from healing.  To help alleviate persistent soreness between the shoulder blades, apply ice or warm moist compresses.  It is normal for this discomfort to persist for several weeks following your surgery.   Do not resume taking bisphosphonates (Fosamax, Actonel, etc.) for 8 weeks after your fusion surgery.    DRIVING  You may NOT drive a car until cleared by your physician.  You may not drive while wearing a neck brace.  If you have to take a long trip as a passenger, make sure to stop every 30 minutes so that you can walk around and stretch your legs.  Reclining the passenger seat can make the trip more comfortable.     FOLLOW-UP APPOINTMENTS  Call [phone] or check MyChart ([email or web address] to confirm that you have a follow-up appointment 10-14 days after surgery.    IF YOU HAVE QUESTIONS or CONCERNS ...   During normal business hours, call our nurse at [phone] or our office at [phone].  During nights/weekends, call [facility] at [phone] and ask for the on-call orthopaedic surgery resident.  For non-urgent concerns, you can also contact our team with MyChart ([email or web address]             Follow Up instructions and Outpatient Referrals     Discharge instructions      Ambulatory referral to Home Health      Is this a [facility] or [place] [facility] referral?: No    Physician to follow patient's care: Referring Provider    Disciplines requested:  Nursing  Physical Therapy  Occupational Therapy  Home Health Aide       Nursing requested: Other: (please enter in comments) Comment - medication management and disease management    Physical Therapy requested: Evaluate and treat    Occupational Therapy Requested: Evaluate and treat    Requested start of care date:  Routine (within 48 hours)        Resources and Referrals     Commode      Length of Need: 99 months    Type of commode: Patient Equipment Comment - 3 in 1 BSC    Please provide 3 in 1 BSC appropriate to patient's height and weight.  Height: 162.6 cm (5' 4.02)     Weight: 64.8 kg (142 lb 12.8 oz)         Walker      Type: Rolling    Wheels: 5 Fixed    Length of Need: 99 months    Please provide Rolling Walker appropriate to patient's height and weight.  Height: 162.6 cm (5' 4.02)     Weight: 64.8 kg (142 lb 12.8 oz)             Home health has been set up for through the agency listed below. The Home health agency will be contacting you to set up a time for them to come see you in your home within 2 days of your discharge.  If you have not heard from them prior to 09/25/19 or you have any questions about home health, please contact them at the phone number listed below.    Selected Continued Care - Admitted Since 09/22/2019     [facility] Coordination complete    Service Provider Selected Services Address Phone Fax Patient Preferred    [facility] [address] [place] [address] [phone] ???                              Future Appointments:  Appointments which have been scheduled for you    Sep 28, 2019  2:40 PM  NEW  RESIDENT with Ander Purpura, MD  [facility] OPHTHALMOLOGY [address] [place] [facility]) [address]  [place] [address]  [phone]      Oct 07, 2019  2:15 PM  (Arrive by 1:45 PM)  XR CERVICAL SPINE AP AND LATERAL with IC DIAG RM 1  IMG DIAG  X-RAY IMAGING CENTER [facility]) [address]  [place] [address]  [phone]      Oct 07, 2019  3:00 PM  (Arrive by 2:45 PM)  Danelle Earthly FOLLOW UP with Nemiah Commander, MD  [facility] SPINE CTR NEUROSURG [facility] [address] [place] [facility] REGION) [address]  [place] [address]  [phone]      Oct 27, 2019  2:00 PM  (Arrive by 1:45 PM)  RETURN  GENERAL with Lorin Glass, MD  [place] DERMATOLOGY [address] [facility] REGION) [address]  [place] [address]  [phone]      Jan 05, 2020 10:00 AM  (Arrive by 9:45 AM)  RETURN VIDEO - OTHER with Corena Pilgrim, MD  [facility] RHEUMATOLOGY [address] [place] [facility] REGION) [id number]  [place] [address]  [phone]

## 2019-09-22 NOTE — Unmapped (Signed)
Intraoperative Neurophysiologic Monitoring Report     Name:  Michele Miller  Date of service:  09/22/19    HISTORY: Cervical spondylosis with myeloradiculopathy.    PROCEDURE:  Intraoperative monitoring was carried out during the operative procedure at the request of the surgeon.      Alternating stimulation was used to left and right ulnar nerves at a rate generally between 2.35-2.66 hertz, with adjustment of stimulus intensity as required. Cortical (N20, P23) and if needed/feasible, subcortical (P14, N18) potentials are recorded utilizing electrodes at Fpz, C3', C4', and Erb???s point.  In addition, alternating stimulation was used to the left and right posterior tibial nerves at a rate generally between 2.35-2.66 hertz, with adjustment of stimulus intensity as required.  Cortical (P37, N45) and when feasible, subcortical (P31, N34) potentials are recorded utilizing electrodes at Fpz, Cz', C3', C4', Cervical.    Motor evoked potentials are obtained by stimulation using subdermal scalp electrodes with a train of 4-9, stimulus pulse duration of 50-75 microsec., and ISI generally of 1.5-2.0 msec.  Responses are recorded from subdermal needle electrodes placed in upper extremity (Trapezius, Deltoid, ECR, APB) and lower extremity (TA, AH) muscle groups.    Subdermal needle electrodes were placed in the region of the left and right trapezius, deltoids, biceps, triceps, ECRs, APBs for recording of spontaneous EMG activity. TECHNICAL DESCRIPTION:  Intraoperative somatosensory evoked potential monitoring during the operative procedure revealed the following.  With both left and right upper extremity stimulation the evoked potentials were recorded, with latencies and amplitudes of obtainable responses monitored.  With both left and right lower extremity stimulation the evoked potentials were recorded, with latencies and amplitudes of obtainable responses monitored.  Motor evoked potential threshold responses were obtained and periodically monitored during the operative procedure.    During the operative procedure the somatosensory evoked potential latencies and amplitudes of recorded potentials remained relatively stable and the motor evoked potential responses remained relatively stable.      At the conclusion of monitoring the evoked potential responses were stable.    Any relevant spontaneous EMG activity was reported to the surgeon.      CLINICAL INTERPRETATION:  Intraoperative somatosensory evoked potential monitoring with alternating left and right upper and lower extremity stimulation showed general preservation of monitored somatosensory pathways over the course of the operative procedure.  Motor evoked potential monitoring showed general preservation of monitored motor pathways during the operative procedure.      I was available throughout the operative procedure.  I had access to intraoperative neurophysiologic monitoring data in real-time.  Direct and immediate communication of intraoperative monitoring results to the intraoperative monitoring technician and the surgeon was present during the operation. Intraoperative monitoring was provided over a period of 222 minutes.  I was providing continuous intraoperative monitoring from outside the operating room, exclusively to this patient, for a period of 222 minutes.      Aris Everts Cing [place] M.D.

## 2019-09-22 NOTE — Unmapped (Signed)
Brief Operative Note  (CSN: [id number])      Date of Surgery: 09/22/2019    Pre-op Diagnosis: Cervical cord compression with myelopathy (ID# [id number], G95.9, [facility]-HCC)  Cerv spondylosis with radic M47.22 (Epic ID# [id number])    Post-op Diagnosis: Cervical cord compression with myelopathy (CMS-HCC) [G95.20]  Cervical spondylosis with radiculopathy [M47.22]    Procedure(s):  ARTHRODES, ANT INTRBDY, INCL DISC SPC PREP, DISCECT, OSTEOPHYT/DECOMPRESS SPINL CRD &/OR NRV RT, CRV BLO C2: 22551 (CPT??)  ARTHROD, ANT INTBDY, INCL DISC SPC PREP/DISCTMY/OSTEPHYT/DECMPR SPNL CRD/NRV RT; CERV BELO C2, EA ADD`L SPCx3: 22552 (CPT??)  ANT INSTRUM; 4 TO 7 VERTEB SEGMT CERVICAL: 98119 (CPT??)  INSERT INTERBODY BIOMECHANICAL DEVICE(S) WITH INTEGRAL ANTERIOR INSTRUMENT FOR DEVICE ANCHORING, WHEN PERFORMED, TO INTERVERTEBRAL DISC SPACE IN CONJUNCTION WITH INTERBODY ARTHRODESIS, EACH INTERSPACE x4: 14782 (CPT??)  AUTOGRAFT/SPINE SURG ONLY (W/HARVEST GRAFT); LOCAL (EG, RIB/SPINOUS PROC, LAM FRGMT) OBTAIN FROM SAME INCIS: 20936 (CPT??)  ALLOGRAFT FOR SPINE SURGERY ONLY; MORSELIZED: 20930 (CPT??)  CONTINUOUS INTRAOPERATIVE NEUROPHYSIOLOGY MONITORING IN OR: 95621 (CPT??)  Note: Revisions to procedures should be made in chart - see Procedures activity.    Performing Service: Orthopedics  Surgeon(s) and Role:     * Nemiah Commander, MD - Primary     * Cleda Mccreedy, MD - Resident - Assisting    Assistant: None    Findings: stenosis as previously described    Anesthesia: General    Estimated Blood Loss: 50 mL    Complications: None    Specimens: None collected    Implants:   Implant Name Type Inv. Item Serial No. Manufacturer Lot No. LRB No. Used Action   LOG [id number] - HBR NEU SPINE DEPUY SYNTHES SKYLINE IMP - 1           LOG [id number] - HBR NEU SPINE DEPUY SYNTHES SKYLINE 4 LEVEL PLATES IMP - 1           SYRINGE DBX PUTTY - [id number] Freeze Dried Bone and Tissue SYRINGE DBX PUTTY [id number] MUSCULOSKELETAL TRANSPLAN  N/A 1 Implanted SYRINGE DBX PUTTY - [id number] Freeze Dried Bone and Tissue SYRINGE DBX PUTTY [id number] MUSCULOSKELETAL TRANSPLAN  N/A 1 Implanted   ACIS ProTi  LOR/L  5mm H     [id number] N/A 1 Implanted   SYRINGE DBX PUTTY - [id number] Freeze Dried Bone and Tissue SYRINGE DBX PUTTY [id number] MUSCULOSKELETAL TRANSPLAN  N/A 1 Implanted   SPACER ACIS PROTI - [id number] Spine Implant SPACER ACIS PROTI  J&J DEPUY SPINE [id number] N/A 1 Implanted   4mm Bengal Cage      N/A 2 Implanted   5mm Large ACIS ProTi      N/A 1 Implanted   SPACER ACIS PROTI - [id number] Spine Implant SPACER ACIS PROTI  J&J DEPUY SPINE  N/A 1 Implanted   PLATE BONE SKYLINE - [id number] Hardware PLATE BONE SKYLINE  J&J DEPUY SPINE  N/A 1 Implanted   SCREW SELF TAP - [id number] Spine Implant SCREW SELF TAP  J&J DEPUY SPINE  N/A 10 Implanted       Surgeon Notes: I was present and scrubbed for the entire procedure    Nemiah Commander   Date: 09/22/2019  Time: 12:03 PM

## 2019-09-22 NOTE — Unmapped (Signed)
ORTHOPAEDIC PROGRESS NOTE     Assessment: 59 y.o. female with s/p C3-7 ACDF. Appropriate progress postoperatively.    Plan:  -Up with assistance  -Soft collar at all times. [place] collar for showering  -Drain out POD#1 unless > 30 cc POD 1, confirm with Dr. Jaynie Collins first  -Dressing change with drain pull  -Foley removed in OR, patient already voided when transferred to floor  -International dysphagia diet, advance as tolerated, if not tolerating, then consult Speech Therapy morning POD#1 for further recommendations.  -Pain control with standing tylenol, robaxin, prn oxycodone and breakthrough morphine  -Continue postoperative Ancef x 23h  -DVT Ppx: SCDs and TEDs   -PT/OT  -Hospitalist consult for perioperative medical management  -Sore throat - chloraseptic spray and throat lozenges prn  -Care management consult for social support, HH in postop period  -Holding methotrexate for psoriatic arthritis, per rheumatologist held 1 week before surgery and will be held until 1 week postop. Holding otezla at least until f/u appointment with Dr. Jaynie Collins.    -Discharge plan: pending PT/OT clearance & pain control; anticipate POD#2  -Follow up plan: 10-14 days post-op  -----------------------------------------------------------------------------------------------  - Orthopaedic contact PA: Ardyth Harps [phone] (Tuesday-Friday until 5pm)  - Current orthopaedic contact resident: Dia Sitter [phone]  - Current orthopaedic care attending: Dr. Jaynie Collins  - On nights (5pm-6am), please page [facility] hospitalist on call      Subjective: No acute events postoperatively. Pain well controlled. Reports incisional soreness.  Patient states that too early to tell whether her preop left arm pain and bilateral hand numbness have improved since surgery. Denies new onset numbness/tingling sensations down bilateral upper and lower extremities. Also denies chest pain, SOB, and nausea/vomiting. Per nurse patient has already voided after transferring to floor. Patient reports sore throat, but denies difficulty swallowing pills and liquids.      Objective:  BP 99/57  - Pulse 72  - Temp 36.5 ??C (97.7 ??F) (Oral)  - Resp 14  - Ht 162.6 cm (5' 4.02)  - Wt 64.8 kg (142 lb 12.8 oz)  - SpO2 92%  - BMI 24.50 kg/m??     Appearance: Groggy, NAD, soft collar in-place  Foam tape dressing - clean, dry, and intact  Cervical drain output - sanguineous output in drain   Sensations intact to light touch bilateral upper and lower extremities. Subjective decreased sensation in bilateral fingertips, per pt this is her baseline  2+ bilateral radial and DP pulses    *Pre-op exam  Motor R L   Deltoid 5 5   Bicep 5 5   Tricep 5 5   WE 5 5   Grip 5 5   IO 5 5   IP 5 5   Quad 5 5   TA 5 5   EHL 5 5   GS 5 5     *Post-op exam  Motor R L   Deltoid 4** 4**   Bicep 4** 4**   Tricep 4** 4**   WE 4** 4**   Grip 4** 4**   IO 4** 4**   IP 5 5   Quad 5 5   TA 5 5   EHL 5 5   GS 5 5   **effort/pain limited

## 2019-09-23 ENCOUNTER — Telehealth: Payer: Self-pay

## 2019-09-23 LAB — BASIC METABOLIC PANEL
ANION GAP: 4 mmol/L — ABNORMAL LOW (ref 7–15)
BLOOD UREA NITROGEN: 12 mg/dL (ref 7–21)
BUN / CREAT RATIO: 20
CALCIUM: 8.3 mg/dL — ABNORMAL LOW (ref 8.5–10.2)
CHLORIDE: 105 mmol/L (ref 98–107)
CO2: 28 mmol/L (ref 22.0–30.0)
CREATININE: 0.6 mg/dL (ref 0.60–1.00)
EGFR CKD-EPI NON-AA FEMALE: >90 mL/min/{1.73_m2} (ref >=60)
GLUCOSE RANDOM: 213 mg/dL — ABNORMAL HIGH (ref 70–179)
POTASSIUM: 4.9 mmol/L (ref 3.5–5.0)

## 2019-09-23 LAB — CBC
HEMATOCRIT: 38.8 % (ref 35.0–44.0)
HEMOGLOBIN: 12.8 g/dL (ref 12.0–15.5)
MEAN CORPUSCULAR HEMOGLOBIN CONC: 33 g/dL (ref 30.0–36.0)
MEAN CORPUSCULAR HEMOGLOBIN: 32.2 pg (ref 26.0–34.0)
MEAN CORPUSCULAR VOLUME: 97.7 fL (ref 82.0–98.0)
MEAN PLATELET VOLUME: 8.4 fL (ref 7.0–10.0)
RED BLOOD CELL COUNT: 3.97 10*12/L (ref 3.90–5.03)
WBC ADJUSTED: 19.3 10*9/L — ABNORMAL HIGH (ref 3.5–10.5)

## 2019-09-23 LAB — MEAN PLATELET VOLUME: Platelet mean volume:EntVol:Pt:Bld:Qn:Automated count: 8.4

## 2019-09-23 LAB — CALCIUM: Calcium:MCnc:Pt:Ser/Plas:Qn:: 8.3 — ABNORMAL LOW

## 2019-09-23 MED ORDER — ONDANSETRON HCL 4 MG TABLET
ORAL_TABLET | Freq: Three times a day (TID) | ORAL | 0 refills | 4.00000 days | Status: CP | PRN
Start: 2019-09-23 — End: 2019-09-23

## 2019-09-23 MED ORDER — DOCUSATE SODIUM 100 MG CAPSULE
ORAL_CAPSULE | Freq: Two times a day (BID) | ORAL | 0 refills | 14 days | Status: CP
Start: 2019-09-23 — End: 2019-10-07
  Filled 2019-09-24: qty 28, 14d supply, fill #0

## 2019-09-23 MED ORDER — ACETAMINOPHEN 500 MG TABLET
ORAL_TABLET | Freq: Three times a day (TID) | ORAL | 0 refills | 14 days | Status: CP | PRN
Start: 2019-09-23 — End: 2019-10-07
  Filled 2019-09-24: qty 84, 14d supply, fill #0
  Filled 2019-09-24: qty 40, 5d supply, fill #0

## 2019-09-23 MED ORDER — NARCAN 4 MG/ACTUATION NASAL SPRAY
0 refills | 0 days | Status: CP
Start: 2019-09-23 — End: ?
  Filled 2019-09-24: qty 2, 2d supply, fill #0

## 2019-09-23 MED ORDER — METHOCARBAMOL 750 MG TABLET
ORAL_TABLET | Freq: Three times a day (TID) | ORAL | 0 refills | 7 days | Status: CP
Start: 2019-09-23 — End: 2019-09-30
  Filled 2019-09-24: qty 21, 7d supply, fill #0

## 2019-09-23 MED ORDER — OXYCODONE 5 MG TABLET
ORAL_TABLET | Freq: Four times a day (QID) | ORAL | 0 refills | 5.00000 days | Status: CP | PRN
Start: 2019-09-23 — End: 2019-09-28

## 2019-09-23 NOTE — Unmapped (Signed)
A & O x 4. Pain well controlled by PRN medication. Sleeping. IV fluid per order. Ambulating to bathroom with standby assist. Positive pulses, sensation and movement in all extremities.

## 2019-09-23 NOTE — Unmapped (Signed)
Home health has been set up for through the agency listed below. The Home health agency will be contacting you to set up a time for them to come see you in your home within 2 days of your discharge.  If you have not heard from them prior to 09/25/19 or you have any questions about home health, please contact them at the phone number listed below.    Selected Continued Care - Admitted Since 09/22/2019     [facility] Care Coordination complete    Service Provider Selected Services Address Phone Fax Patient Preferred    [facility] [address] [place] [address] [phone] ???

## 2019-09-23 NOTE — Unmapped (Signed)
OCCUPATIONAL THERAPY  Evaluation (09/23/19 1006)    Patient Name:  Michele Miller       Medical Record Number: [id number]   Date of Birth: October 31, 1960  Sex: Female          OT Treatment Diagnosis:  59 y.o. female with s/p C3-7 ACDF. Appropriate progress postoperatively    Assessment    Problem List: Pain, Orthopedic restrictions, Fall Risk    Clinical Decision Making: Low    Assessment:     Pt presenting with 8/10 cervical pain, new post op cervical spine precautions, and currenlty at near baseline level with OT. Pt requiring supervision with sit to stand from recliner, ambulating with supervision with no use of RW, pt performed donning of sock with use of figure four position with (I), performed oral care standing at sink level with set up, pt perform doffing/donning of hospital gown with (I), with good adherence to cervical precautions. Recommend no acute OT , recommend post-acute OT 3x , Pt states that her neighbors will assist her at home and that CM is working on getting her a home health aide. DME: three in one commode and shower chair. Based on the daily activity AM-PAC raw score of 22/24, the pt is considered to be 25.8% impaired with self care.    Today's Interventions: ADL retraining, Balance activities, Bed mobility, Compensatory tech. training, Conservation, Education - Patient, Education - Family / caregiver, Functional mobility, Safety education, Transfer training    Today's Interventions: Role of OT and POC, standing balance/tolerance, functional mobility, safety awareness, activity tolerance, AM-PAC    Activity Tolerance During Today's Session  Patient tolerated treatment well    Plan  Planned Frequency of Treatment:  D/C Services for: D/C Services     Post-Discharge Occupational Therapy Recommendations:  OT Post Acute Discharge Recommendations: 3x weekly   OT DME Recommendations: Three in one commode, Shower chair with back    GOALS:   Patient and Family Goals: to work on my balance Prognosis:  Good  Positive Indicators:  good PLOF, supportive neighbors, motivated  Barriers to Discharge: Decreased caregiver support    Subjective  Current Status pt received/left seated in recliner, RN aware, all needs met, call bell within reach  Prior Functional Status Pt reports (I) with ADLs, cooking, cleaning, and driving, pt endoreses multiple falls, ambulates with no AD, pt states that her brother lives with her mother, but when her mother had surgery last year, she was her mother's caretaker. pt states that her mother will sometimes come live with her to give her brother a break.    Medical Tests / Procedures: Reviewed in EPIC  Services patient receives: OT, PT    Patient / Caregiver reports: pt and RN agreeable    Past Medical History:   Diagnosis Date   ??? Aortic atherosclerosis (CMS-HCC)    ??? Baker's cyst    ??? COPD (chronic obstructive pulmonary disease) (CMS-HCC)    ??? CTS (carpal tunnel syndrome)    ??? Depression    ??? Headache(784.0)    ??? HLD (hyperlipidemia)    ??? Joint pain    ??? Myelomalacia of cervical cord (CMS-HCC) 09/22/2019   ??? Psoriasis    ??? Psoriatic arthritis (CMS-HCC)    ??? PTSD (post-traumatic stress disorder)    ??? Scoliosis    ??? Tobacco use     Social History     Tobacco Use   ??? Smoking status: Current Every Day Smoker     Packs/day: 0.75  Years: 20.00     Pack years: 15.00     Types: Cigarettes     Start date: 01/02/1974   ??? Smokeless tobacco: Never Used   Substance Use Topics   ??? Alcohol use: No     Alcohol/week: 0.0 standard drinks      Past Surgical History:   Procedure Laterality Date   ??? CARPAL TUNNEL RELEASE     ??? COLONOSCOPY     ??? HYSTERECTOMY      For endometriosis, ovaries still in place   ??? KNEE SURGERY     ??? PR REVISE MEDIAN N/CARPAL TUNNEL SURG Right 02/13/2014    Procedure: NEUROPLASTY AND/OR TRANSPOSITION; MEDIAN NERVE AT CARPAL TUNNEL;  Surgeon: Abram Sander, MD;  Location: [facility];  Service: Orthopedics    Family History   Problem Relation Age of Onset   ??? COPD Mother    ??? Osteoporosis Mother    ??? Hypertension Father    ??? No Known Problems Sister    ??? No Known Problems Daughter    ??? No Known Problems Maternal Grandmother    ??? No Known Problems Maternal Grandfather    ??? No Known Problems Paternal Grandmother    ??? No Known Problems Paternal Grandfather    ??? Anesthesia problems Neg Hx    ??? Broken bones Neg Hx    ??? Cancer Neg Hx    ??? Clotting disorder Neg Hx    ??? Collagen disease Neg Hx    ??? Diabetes Neg Hx    ??? Dislocations Neg Hx    ??? Fibromyalgia Neg Hx    ??? Gout Neg Hx    ??? Hemophilia Neg Hx    ??? Rheumatologic disease Neg Hx    ??? Scoliosis Neg Hx    ??? Severe sprains Neg Hx    ??? Sickle cell anemia Neg Hx    ??? Spinal Compression Fracture Neg Hx    ??? BRCA 1/2 Neg Hx    ??? Breast cancer Neg Hx    ??? Colon cancer Neg Hx    ??? Endometrial cancer Neg Hx    ??? Ovarian cancer Neg Hx    ??? Melanoma Neg Hx    ??? Basal cell carcinoma Neg Hx    ??? Squamous cell carcinoma Neg Hx         Patient has no known allergies.     Objective Findings  Precautions / Restrictions  Falls precautions, Spinal precautions    Weight Bearing  Non-applicable    Required Braces or Orthoses  (soft collar at all times, [place] collar with showers)    Communication Preference  Verbal    Pain  pt with c/o 8/10 pain to lateral aspect of left neck radiating down left arm, RN made aware    Equipment / Environment   IV access, caregiver with mask    Living Situation  Living Environment: House  Lives With: Alone  Home Living: One level home, Tub/shower unit, Grab bars in shower, Hand-held shower hose     Cognition   Orientation Level:  Oriented x 4   Arousal/Alertness:  Appropriate responses to stimuli   Attention Span:  Appears intact   Memory:  Appears intact   Following Commands:  Follows all commands and directions without difficulty   Safety Judgment:  Good awareness of safety precautions   Awareness of Errors:  Good awareness of safety precautions   Problem Solving:  Able to problem solve independently Comments: n/a  Vision / Perception    Hearing: WFL   Vision: Wears glasses for reading only  Perception: grossly intact  Comments: n/a    Hand Function  Hand Dominance: right  B grip WLF, pt with noted tremors stating that she has had them for a while    Skin Inspection  visible skin intact    ROM / Strength/Coordination  UE ROM/ Strength/ Coordination: B UE AROM, no shoulder flexion >90*  LE ROM/ Strength/ Coordination: B LE WFL    Sensation:  SILT    Balance:  sitting balance is good, standing balance is fair+    Functional Mobility  Transfer Assistance Needed: No  Bed Mobility Assistance Needed: No      ADLs  ADLs: Supervision  IADLs: NT      Vitals / Orthostatics  At Rest: NAD  With Activity: NAD  Orthostatics: asyptomatic      Medical Staff Made Aware: Aphrodite RN      Occupational Therapy Session Duration  OT Individual - Duration: 38         I attest that I have reviewed the above information.  Signed: Ricky Stabs, OT  Filed 09/23/2019

## 2019-09-23 NOTE — Unmapped (Signed)
ORTHOPAEDIC PROGRESS NOTE     Assessment: 59 y.o. female with s/p C3-7 ACDF. Appropriate progress postoperatively.    Plan:  -Up with assistance  -Soft collar at all times. [place] collar for showering  -Drain out today pending Dr. Jaynie Collins  -Dressing change with drain pull  -International dysphagia diet, advance as tolerated, if not tolerating, then consult Speech Therapy morning POD#1 for further recommendations.  -Pain control with standing tylenol, robaxin, prn oxycodone and breakthrough morphine  -Continue postoperative Ancef x 23h  -DVT Ppx: SCDs and TEDs   -PT/OT for balance issues  -Hospitalist consult for perioperative medical management  -Sore throat - chloraseptic spray and throat lozenges prn  -Care management consult for social support, HH in postop period  -Holding methotrexate for psoriatic arthritis, per rheumatologist held 1 week before surgery and will be held until 1 week postop. Holding otezla at least until f/u appointment with Dr. Jaynie Collins.    -Discharge plan: pending PT/OT clearance & pain control; anticipate POD#2  -Follow up plan: 10-14 days post-op  -----------------------------------------------------------------------------------------------  - Orthopaedic contact PA: Ardyth Harps [phone] (Tuesday-Friday until 5pm)  - Current orthopaedic contact resident: Dia Sitter [phone]  - Current orthopaedic care attending: Dr. Jaynie Collins  - On nights (5pm-6am), please page [facility] hospitalist on call      Subjective: No acute events postoperatively. Did not sleep well last night, having some pain in neck at incision site. No difficulty breathing. Was able to keep some mashed potatoes down last night, walked to bathroom. Unsure if she has numbness in fingers as she had preop.     Objective:  BP 158/82  - Pulse 70  - Temp 35.9 ??C (Oral)  - Resp 18  - Ht 162.6 cm (5' 4.02)  - Wt 64.8 kg (142 lb 12.8 oz)  - SpO2 92%  - BMI 24.50 kg/m??     Appearance: Awake and alert, NAD, soft collar in-place Foam tape dressing - clean, dry, and intact  Cervical drain output - sanguineous output in drain - 62 ml charted  Sensations intact to light touch bilateral upper and lower extremities. Subjective decreased sensation in bilateral fingertips, per pt this is her baseline  2+ bilateral radial and DP pulses        Motor R L   Deltoid 4** 4**   Bicep 4** 4**   Tricep 4** 4**   WE 4** 4**   Grip 4** 4**   IO 4** 4**   IP 5 5   Quad 5 5   TA 5 5   EHL 5 5   GS 5 5   **effort/pain limited

## 2019-09-23 NOTE — Unmapped (Signed)
Adult Nutrition Assessment Note    Visit Type: RN Consult  Reason for Visit: Have you gained or lost 10 pounds in the past 3 months?    ASSESSMENT:   HPI & PMH: Per EMR, Michele Miller returns today for further evaluation and treatment of her chief complaint of left neck and left upper extremity pain.  Nutrition Hx: Patient reports that she has recently gained 10 lbs due to medications that she has since stopped. Per pt, previously she had lost a lot of weight as she was stressed due to personal circumstances. Patient's appetite recently consists of 1-2 meals/day, sometimes not eating. Patient denies N/V, chewing/swallwing difficulty, but does endorse constipation.   Nutritionally Pertinent Meds: colace, folic acid  Labs: Glucose POC 93-213  Abd/GI: Last BM 01/07  Skin: surgical site  Patient Lines/Drains/Airways Status    Active Wounds     Name:   Placement date:   Placement time:   Site:   Days:    Surgical Site 09/22/19 Neck   09/22/19    1204     1               Current nutrition therapy order:   Nutrition Orders          Nutrition Therapy International Dysphagia; Thin Liquid, Level 0, Soft & Bite Sized, Level 6 starting at 01/07 1434    Advance diet as tolerated starting at 01/07 1434           Anthropometric Data:  -- Height: 162.6 cm (5' 4.02)   -- Last recorded weight: 64.8 kg (142 lb 12.8 oz)  -- Admission weight: 64.8 kg (142 lb 12.8 oz)  -- IBW: 54.53 kg  -- Percent IBW:    -- BMI: Body mass index is 24.5 kg/m??.   -- Weight changes this admission:   Last 5 Recorded Weights    09/22/19 0644   Weight: 64.8 kg (142 lb 12.8 oz)      -- Weight history PTA:   Wt Readings from Last 10 Encounters:   09/22/19 64.8 kg (142 lb 12.8 oz)   09/02/19 64.6 kg (142 lb 6.4 oz)   06/20/19 63.3 kg (139 lb 9.6 oz)   03/14/19 60.4 kg (133 lb 3.2 oz)   02/23/19 60.5 kg (133 lb 4.8 oz)   02/16/19 65.8 kg (145 lb)   02/14/19 61.9 kg (136 lb 8 oz)   01/12/19 60.8 kg (134 lb)   01/11/19 60.3 kg (133 lb) 12/30/18 52.2 kg (115 lb)        Daily Estimated Nutrient Needs:   Energy: 1625-1950 kcals [25-30 kcal/kg using last recorded weight, 65 kg (09/23/19 1711)]  Protein: 78-98 gm [1.2-1.5 gm/kg using last recorded weight, 65 kg (09/23/19 1711)]  Carbohydrate:   [no restriction]  Fluid:   [per MD team]     Nutrition Focused Physical Exam:  Fat Areas Examined  Orbital: Mild loss  Upper Arm: No loss      Muscle Areas Examined  Temple: Mild loss  Clavicle: No loss  Acromion: No loss  Dorsal Hand: No loss  Patellar: No loss  Anterior Thigh: No loss  Posterior Calf: No loss         Nutrition Evaluation  Overall Impressions: No fat loss;No muscle loss (09/23/19 1712)  Nutrition Designation: Normal weight (BMI 18.50 - 24.99 kg/m2) (09/23/19 1712)     DIAGNOSIS:  Malnutrition Assessment using AND/ASPEN Clinical Characteristics:    Patient does not meet AND/ASPEN criteria for malnutrition at  this time (09/23/19 1713)                       Overall nutrition impression:   Patient with low intake, eating 1-2 meals/day, sometimes none, however, recent weight gain, no losses on NFPE. Patient would benefit from the use of nutrition supplements, will send Ensure Max as patient's BG is elevated.     GOALS:  Oral Intake:       - Patient to consume >/=50% of 3 meals per day.     RECOMMENDATIONS AND INTERVENTIONS:  1. Continue current diet order  2. RD will order Ensure Max BID to encourage intake  3. Continue to monitor intake per % meal documentation in EMR  4. Please weigh patient weekly      RD Follow Up Parameters:  1-2 times per week (and more frequent as indicated)     Abel Presto, MPH, RDN, LDN, CSCS  Pager: [phone]  Phone: [phone]

## 2019-09-23 NOTE — Unmapped (Signed)
PHYSICAL THERAPY  Evaluation (09/23/19 0815)     Patient Name:  Michele Miller       Medical Record Number: [id number]   Date of Birth: [birth year]  Sex: Female            Treatment Diagnosis: s/p ACDF    ASSESSMENT  Problem List: Gait deviation, Decreased mobility, Impaired balance     Assessment : Michele Miller is 59 y.o. female with a PMH of cervical spine spondylosis, psoriatic arthritis and dermatitis, COPD, aortic atherosclerosis, tobacco use, anxiety, depression, PTSD admitted for ACDF on 09/22/2019. pt presents to PT with decreased balance and unsteady ambulation who will benefit from PT intervention to address. Based on the AM-PAC 6 item raw score of 19/24, the patient is considered to be 36.99% impaired with basic mobility. This indicates pt will benefit from 3x/week at DC. After review of contributing co-morbitities and personal factors, clinical presentation and exam findings, patient demonstrates low complexity for evaluation and development of POC.        Today's Interventions: pt ed re PT POC, cervical precautions, mobility training, ambulation, positioning, eval, AM-PAC 19/24                          PLAN  Planned Frequency of Treatment:  2x per day for: 7x week      Planned Interventions: Balance activities, Education - Patient, Functional mobility, Stair training, Therapeutic exercise, Therapeutic activity, Transfer training    Post-Discharge Physical Therapy Recommendations:  3x weekly    PT DME Recommendations: (TBD)           Goals:   Patient and Family Goals: home    Long Term Goal #1: 3 weeks- community ambulation mod I, LRAD       SHORT GOAL #1: pt will perform all transfers mod I, LRAD              Time Frame : 1 week  SHORT GOAL #2: pt will ambulate 150 ft mod I, LRAD              Time Frame : 1 week  SHORT GOAL #3: pt will negotiate 4 steps with one rail, mod I              Time Frame : 1 week                                        Prognosis:  Good  Positive Indicators: participation  Barriers to Discharge: Decreased caregiver support    SUBJECTIVE  Patient reports: RN/pt consent for PT;Can I get pain meds when I need it today?  Current Functional Status: pt received and session concluded with pt in the bed, needs in reach, RN aware  Services patient receives: PT  Prior Functional Status: Ambulatory without AD, reports h/o falls with imbalance, on meclizine; pt reports that she can tell when it is coming on  Equipment available at home: None     Past Medical History:   Diagnosis Date   ??? Aortic atherosclerosis (CMS-HCC)    ??? Baker's cyst    ??? COPD (chronic obstructive pulmonary disease) (CMS-HCC)    ??? CTS (carpal tunnel syndrome)    ??? Depression    ??? Headache(784.0)    ??? HLD (hyperlipidemia)    ??? Joint pain    ??? Myelomalacia of cervical cord (CMS-HCC)  09/22/2019   ??? Psoriasis    ??? Psoriatic arthritis (CMS-HCC)    ??? PTSD (post-traumatic stress disorder)    ??? Scoliosis    ??? Tobacco use     Social History     Tobacco Use   ??? Smoking status: Current Every Day Smoker     Packs/day: 0.75     Years: 20.00     Pack years: 15.00     Types: Cigarettes     Start date: 01/02/1974   ??? Smokeless tobacco: Never Used   Substance Use Topics   ??? Alcohol use: No     Alcohol/week: 0.0 standard drinks      Past Surgical History:   Procedure Laterality Date   ??? CARPAL TUNNEL RELEASE     ??? COLONOSCOPY     ??? HYSTERECTOMY      For endometriosis, ovaries still in place   ??? KNEE SURGERY     ??? PR REVISE MEDIAN N/CARPAL TUNNEL SURG Right 02/13/2014    Procedure: NEUROPLASTY AND/OR TRANSPOSITION; MEDIAN NERVE AT CARPAL TUNNEL;  Surgeon: Abram Sander, MD;  [facility]: [facility];  Service: Orthopedics    Family History   Problem Relation Age of Onset   ??? COPD Mother    ??? Osteoporosis Mother    ??? Hypertension Father    ??? No Known Problems Sister    ??? No Known Problems Daughter    ??? No Known Problems Maternal Grandmother    ??? No Known Problems Maternal Grandfather    ??? No Known Problems Paternal Grandmother ??? No Known Problems Paternal Grandfather    ??? Anesthesia problems Neg Hx    ??? Broken bones Neg Hx    ??? Cancer Neg Hx    ??? Clotting disorder Neg Hx    ??? Collagen disease Neg Hx    ??? Diabetes Neg Hx    ??? Dislocations Neg Hx    ??? Fibromyalgia Neg Hx    ??? Gout Neg Hx    ??? Hemophilia Neg Hx    ??? Rheumatologic disease Neg Hx    ??? Scoliosis Neg Hx    ??? Severe sprains Neg Hx    ??? Sickle cell anemia Neg Hx    ??? Spinal Compression Fracture Neg Hx    ??? BRCA 1/2 Neg Hx    ??? Breast cancer Neg Hx    ??? Colon cancer Neg Hx    ??? Endometrial cancer Neg Hx    ??? Ovarian cancer Neg Hx    ??? Melanoma Neg Hx    ??? Basal cell carcinoma Neg Hx    ??? Squamous cell carcinoma Neg Hx         Allergies: Patient has no known allergies.                Objective Findings  Precautions / Restrictions  Precautions: Falls precautions, Spinal precautions  Weight Bearing Status: Non-applicable  Required Braces or Orthoses: (C-collar)    Communication Preference: Verbal   Pain Comments: 5-6/10, RN aware  Medical Tests / Procedures: reviewed in EPIC  Equipment / Environment: Vascular access (PIV, TLC, Port-a-cath, PICC), Caregiver wearing mask for full session, Patient wearing mask for full session    At Rest: VSS per EPIC  With Activity: NAD  Orthostatics: mild initial transient dizziness EOB       Living Situation  Living Environment: House  Lives With: Alone(has friends who can assist, brother takes care of their mother)  Home Living: One level home(4  STE with rail)     Cognition: alert, able to follow direction and answer questions     Skin Inspection: observed intact    UE ROM: limited by cervical precautions o/w functional  UE Strength: + anti-gravity, weak hand grip  LE ROM: WFL  LE Strength: WFL                       Sensation: reports some numbness/tingling finger tips  Balance: stands with CGA, no AD (mild shaking but pt feeling cold)   Posture: forward head     Bed Mobility: supine to short sit via log roll L with cueing and CGA to maintain spine precautions  Transfers: sit<>stand CGA, no AD   Gait  Level of Assistance: Minimal assist, patient does 75% or more  Assistive Device: Other (Comment)(HHA)  Distance Ambulated (ft): 70 ft  Gait: mild unsteadiness         Endurance: no SOB during session    Physical Therapy Session Duration  PT Individual - Duration: 25    Medical Staff Made Aware: RN, Aphrodite    I attest that I have reviewed the above information.  SignedDelbert Phenix, PT  Filed 09/23/2019

## 2019-09-23 NOTE — Unmapped (Signed)
Hospitalist Daily Progress Note     LOS: 1 day     Assessment/Plan:  Principal Problem:    Cervical spondylosis with myelopathy and radiculopathy  Active Problems:    Depressive disorder    Anxiety    COPD (chronic obstructive pulmonary disease) (CMS-HCC)    PTSD (post-traumatic stress disorder)    Myelomalacia of cervical cord (CMS-HCC)  Resolved Problems:    * No resolved hospital problems. *       Admitted for ACDF   - Pain control, DVT prophylaxis deferred to primary team (Orthopedics).  - PT/OT per primary team.  - Wound care, precautions, weight-bearing deferred to primary orthopedic team  ??  Psoriatic arthritis and dermatitis   Follows with rheumatology  Has been on methotrexate, defer to orthopedic surgery when to resume  She has a prescription for [facility], but has not started it yet.  She is waiting until postop to start.  Topical clobetasol  Has not been taking prednisone prior to surgery.  ??  COPD  Continue as needed albuterol  Spiriva or formulary equivalent  Encourage smoking cessation  ??  Aortic atherosclerosis  Seen incidentally on CT scan  Recommend following up with primary care physician after recovery from surgery  Resume aspirin as soon as possible.  Patient is not yet taking a statin.  Smoking cessation.  ??  Tobacco use  Encourage smoking cessation  ??  Anxiety/depression/PTSD  Continue to follow with outpatient psychiatry and therapy  Clonazepam - she takes this 3 times a day.    We did resume this scheduled.  Patient is awake and alert with no signs of sedation.    Continue duloxetine  ??  *DVT prophylaxis.  Defer to primary team    *Dispo.  Discharge pending clearance for mobility and pain control.    Consultants:   Orthopedic primary    Hospitalists will sign off for now. Please call hospitalist with medical questions or acute medical issues.      Pending labs:   Pending Labs     Order Current Status    Basic Metabolic Panel Collected (09/23/19 0800)    CBC Collected (09/23/19 0800) Subjective:   Patient states that she is doing better this morning.  She does continue to have some neck and shoulder pain.  No chest pain, shortness of breath, lightheadedness or dizziness.    Objective:   Physical Exam:  General: Lying in bed, appears comfortable.    Eyes: Anicteric sclerae, no conjunctival injection.   ENT: Mucous membranes moist.   Respiratory:  Normal work of breathing   Psychiatric: Alert, oriented.  Answers questions appropriately.         Vital signs in last 24 hours:  Temp:  [35.9 ??C (96.7 ??F)-37.1 ??C (98.8 ??F)] 35.9 ??C (96.7 ??F)  Heart Rate:  [70-90] 70  SpO2 Pulse:  [78-90] 78  Resp:  [10-18] 18  BP: (99-158)/(57-121) 158/82  MAP (mmHg):  [82-111] 111  SpO2:  [86 %-95 %] 92 %    Intake/Output last 24 hours:    Intake/Output Summary (Last 24 hours) at 09/23/2019 0811  Last data filed at 09/23/2019 0600  Gross per 24 hour   Intake 3281 ml   Output 3362 ml   Net -81 ml       All lab results last 24 hours:    Recent Results (from the past 24 hour(s))   CBC    Collection Time: 09/23/19  8:00 AM   Result Value Ref Range  WBC 19.3 (H) 3.5 - 10.5 10*9/L    RBC 3.97 3.90 - 5.03 10*12/L    HGB 12.8 12.0 - 15.5 g/dL    HCT 16.1 09.6 - 04.5 %    MCV 97.7 82.0 - 98.0 fL    MCH 32.2 26.0 - 34.0 pg    MCHC 33.0 30.0 - 36.0 g/dL    RDW 40.9 (H) 81.1 - 15.0 %    MPV 8.4 7.0 - 10.0 fL    Platelet 280 150 - 450 10*9/L   Basic Metabolic Panel    Collection Time: 09/23/19  8:00 AM   Result Value Ref Range    Sodium 137 135 - 145 mmol/L    Potassium 4.9 3.5 - 5.0 mmol/L    Chloride 105 98 - 107 mmol/L    CO2 28.0 22.0 - 30.0 mmol/L    Anion Gap 4 (L) 7 - 15 mmol/L    BUN 12 7 - 21 mg/dL    Creatinine 9.14 7.82 - 1.00 mg/dL    BUN/Creatinine Ratio 20     EGFR CKD-EPI Non-African American, Female >90 >=60 mL/min/1.53m2    EGFR CKD-EPI African American, Female >90 >=60 mL/min/1.39m2    Glucose 213 (H) 70 - 179 mg/dL    Calcium 8.3 (L) 8.5 - 10.2 mg/dL       Medications:   Scheduled Meds: ??? acetaminophen  1,000 mg Oral Q8H SCH   ??? clobetasoL   Topical BID   ??? clonazePAM  0.5 mg Oral TID   ??? docusate sodium  100 mg Oral BID   ??? DULoxetine  120 mg Oral Daily   ??? folic acid  1 mg Oral Daily   ??? ipratropium  500 mcg Nebulization 4x Daily (RT)   ??? traZODone  50 mg Oral Nightly     Continuous Infusions:  ??? dextrose 5 % and sodium chloride 0.45 % with KCl 20 mEq/L 100 mL/hr (09/22/19 1435)   ??? lactated Ringers

## 2019-09-23 NOTE — Unmapped (Signed)
Care Management  Initial Transition Planning Assessment   Type of Residence: Mailing Address:  [address]  [address]  [place] [address]  Contacts: Accompanied by: Alone Extended Emergency Contact Information  Primary Emergency Contact: Trudie Buckler  Mobile Phone: [phone]  Relation: Brother  Secondary Emergency Contact: Candy Sledge [place]  Home Phone: [phone]  Relation: Mother  Interpreter needed? No    Patient Phone Number: [phone] (home)           Medical Provider(s): Marjie Skiff, AGNP  Reason for Admission: Admitting Diagnosis:  Cervical cord compression with myelopathy (CMS-HCC) [G95.20]  Cervical spondylosis with radiculopathy [M47.22]  Past Medical History:   has a past medical history of Aortic atherosclerosis (CMS-HCC), Baker's cyst, COPD (chronic obstructive pulmonary disease) (CMS-HCC), CTS (carpal tunnel syndrome), Depression, Headache(784.0), HLD (hyperlipidemia), Joint pain, Myelomalacia of cervical cord (CMS-HCC) (09/22/2019), Psoriasis, Psoriatic arthritis (CMS-HCC), PTSD (post-traumatic stress disorder), Scoliosis, and Tobacco use.  Past Surgical History:   has a past surgical history that includes Knee surgery; Colonoscopy; Carpal tunnel release; Hysterectomy; and pr revise median n/carpal tunnel surg (Right, 02/13/2014).   Previous admit date: N/A    Editor, commissioning- Payor: Advertising copywriter MEDICARE ADV / Plan: UNITED HEALTHCARE DUAL COMPLETE HMO / Product Type: *No Product type* /   Secondary Insurance ??? Secondary Insurance  MEDICAID [place]  Prescription Coverage ??? Mdicaid  Preferred Pharmacy - [id number] - [place], [place] - [address] AT [facility] [address]  [id number] - [place] - [address] [place] [address]  [facility]  [facility]    Transportation home: Private vehicle, brother or neighbor Level of function prior to admission: Requires Midwife assessed the patient by : Medical record review, Discussion with Clinical Care team, In person interview with patient  Orientation Level: Oriented X4  Who provides care at home?: Family member  Reason for referral: Discharge Planning, Home Health    Contact/Decision Maker  Extended Emergency Contact Information  Primary Emergency Contact: Trudie Buckler  Mobile Phone: [phone]  Relation: Brother  Secondary Emergency Contact: Candy Sledge [place]  Home Phone: [phone]  Relation: Mother  Interpreter needed? No    Legal Next of Kin / Guardian / POA / Advance Directives     HCDM (patient stated preference): Trudie Buckler - Brother - [phone]    Advance Directive (Medical Treatment)  Does patient have an advance directive covering medical treatment?: Patient does not have advance directive covering medical treatment.  Reason patient does not have an advance directive covering medical treatment:: Patient does not wish to complete one at this time.    Health Care Decision Maker [HCDM] (Medical & Mental Health Treatment)  Healthcare Decision Maker: HCDM documented in the HCDM/Contact Info section.  Information offered on HCDM, Medical & Mental Health advance directives:: Patient declined information.    Advance Directive (Mental Health Treatment)  Does patient have an advance directive covering mental health treatment?: Patient does not have advance directive covering mental health treatment.  Reason patient does not have an advance directive covering mental health treatment:: Patient does not wish to complete one at this time.    Patient Information  Lives with: Alone    Type of  Residence: Private residence        Location/Detail: [address] [place], [place]    Support Systems/Concerns: Manufacturing engineer, Family Members    Responsibilities/Dependents at home?: No Home Care services in place prior to admission?: No          Outpatient/Community Resources in place prior to admission: Clinic  Agency detail (Name/Phone #): (PCP) [facility]    Equipment Currently Used at Home: none       Currently receiving outpatient dialysis?: No       Financial Information       Need for financial assistance?: No       Social Determinants of Health  Social Determinants of Health were addressed in provider documentation.  Please refer to patient history.  Social History     Socioeconomic History   ??? Marital status: Single     Spouse name: None   ??? Number of children: None   ??? Years of education: None   ??? Highest education level: None   Occupational History   ??? Occupation: Glass blower/designer   Social Needs   ??? Financial resource strain: Not hard at all   ??? Food insecurity     Worry: Patient refused     Inability: Patient refused   ??? Transportation needs     Medical: Patient refused     Non-medical: Patient refused   Tobacco Use   ??? Smoking status: Current Every Day Smoker     Packs/day: 0.75     Years: 20.00     Pack years: 15.00     Types: Cigarettes     Start date: 01/02/1974   ??? Smokeless tobacco: Never Used   Substance and Sexual Activity   ??? Alcohol use: No     Alcohol/week: 0.0 standard drinks   ??? Drug use: No   ??? Sexual activity: None   Lifestyle   ??? Physical activity     Days per week: None     Minutes per session: None   ??? Stress: None   Relationships   ??? Social Wellsite geologist on phone: None     Gets together: None     Attends religious service: None     Active member of club or organization: None     Attends meetings of clubs or organizations: None     Relationship status: None   Other Topics Concern   ??? Exercise No   ??? Living Situation No   ??? Do you use sunscreen? Yes   ??? Tanning bed use? No   ??? Are you easily burned? No   ??? Excessive sun exposure? No   ??? Blistering sunburns? No   Social History Narrative   ??? None     Housing/Utilities ??? Within the past 12 months, have you ever stayed: outside, in a car, in a tent, in an overnight shelter, or temporarily in someone else's home (i.e. couch-surfing)?     ??? Are you worried about losing your housing?     ??? Within the past 12 months, have you been unable to get utilities (heat, electricity) when it was really needed?       Literacy   ??? How often do you need to have someone help you when you read instructions, pamphlets, or other written material from your doctor or pharmacy?         Discharge Needs Assessment  Concerns to be Addressed: denies needs/concerns at this time  Clinical Risk Factors: New Diagnosis, Functional Limitations    Barriers to taking medications: No    Prior overnight hospital stay or ED visit in last 90 days: No    Readmission Within the Last 30 Days: no previous admission in last 30 days         Anticipated Changes Related to Illness: none    Equipment Needed After Discharge: none    Discharge Facility/Level of Care Needs: other (see comments)(Home Health)    Readmission  Risk of Unplanned Readmission Score: UNPLANNED READMISSION SCORE: 15%  Predictive Model Details           15% (Medium) Factors Contributing to Score   Calculated 09/23/2019 12:48 32% Number of active Rx orders is 39   [place] Risk of Unplanned Readmission Model 24% Diagnosis of drug abuse is present     11% Latest calcium is low (8.3 mg/dL)     8% Imaging order is present in last 6 months     7% Number of ED visits in last six months is 1     5% Age is 11     5% Active corticosteroid Rx order is present     3% Charlson Comorbidity Index is 2     2% Future appointment is scheduled     1% Current length of stay is 1.036 days     1% Active ulcer medication Rx order is present     Readmitted Within the Last 30 Days? (No if blank)   Patient at risk for readmission?: No    Discharge Plan  Screen findings are: Discharge planning needs identified or anticipated (Comment).    Expected Discharge Date: 09/24/2019 Expected Transfer from Critical Care: (N/A)    Patient and/or family were provided with choice of facilities / services that are available and appropriate to meet post hospital care needs?: Yes   List choices in order highest to lowest preferred, if applicable. : No preference    Initial Assessment complete?: Yes      Michele Miller  September 23, 2019 1:06 PM

## 2019-09-23 NOTE — Telephone Encounter (Signed)
I would recommend patient talk to RN case manager or SW on floor prior to leaving to ensure she can get this started.  She will need to reach out to h  er insurance as well to see what papers need to be obtained and what benefits are

## 2019-09-23 NOTE — Telephone Encounter (Signed)
Tried to call patient to let her know what [name] said, no answer, unable to leave a message.

## 2019-09-23 NOTE — Telephone Encounter (Signed)
Copied from [place] [phone]. Topic: General - Other >> Sep 23, 2019  2:07 PM Greggory Keen D wrote: Reason for CRM: Pt is at [facility] and had back surgery.  She is due to leave tomorrow and she has dual coverage through [facility].  She has a lot of benefits that she will need to have a doctor to sign off on.  She checked with her surgeon and he said her primary doctor needs to start the paperwork process.  She is a little concerned about going home by herself and caring for herself. She can through her benefits have assistance and some care in her home   CB#  [phone]

## 2019-09-24 LAB — CBC
HEMATOCRIT: 38.5 % (ref 35.0–44.0)
HEMOGLOBIN: 12.7 g/dL (ref 12.0–15.5)
MEAN CORPUSCULAR HEMOGLOBIN CONC: 33.1 g/dL (ref 30.0–36.0)
MEAN CORPUSCULAR HEMOGLOBIN: 32.2 pg (ref 26.0–34.0)
MEAN CORPUSCULAR VOLUME: 97.4 fL (ref 82.0–98.0)
PLATELET COUNT: 278 10*9/L (ref 150–450)
RED CELL DISTRIBUTION WIDTH: 15.3 % — ABNORMAL HIGH (ref 12.0–15.0)
WBC ADJUSTED: 15.9 10*9/L — ABNORMAL HIGH (ref 3.5–10.5)

## 2019-09-24 LAB — WBC ADJUSTED: Leukocytes:NCnc:Pt:Bld:Qn:: 15.9 — ABNORMAL HIGH

## 2019-09-24 LAB — BASIC METABOLIC PANEL
ANION GAP: 4 mmol/L — ABNORMAL LOW (ref 7–15)
BLOOD UREA NITROGEN: 13 mg/dL (ref 7–21)
BUN / CREAT RATIO: 19
CALCIUM: 8.4 mg/dL — ABNORMAL LOW (ref 8.5–10.2)
CO2: 30 mmol/L (ref 22.0–30.0)
CREATININE: 0.68 mg/dL (ref 0.60–1.00)
EGFR CKD-EPI AA FEMALE: >90 mL/min/{1.73_m2} (ref >=60)
GLUCOSE RANDOM: 82 mg/dL (ref 70–179)
POTASSIUM: 3.9 mmol/L (ref 3.5–5.0)
SODIUM: 138 mmol/L (ref 135–145)

## 2019-09-24 LAB — ANION GAP: Anion gap 3:SCnc:Pt:Ser/Plas:Qn:: 4 — ABNORMAL LOW

## 2019-09-24 MED ORDER — DULOXETINE HCL 60 MG PO CPEP
120.00 | ORAL_CAPSULE | ORAL | Status: DC
Start: 2019-09-25 — End: 2019-09-24

## 2019-09-24 MED ORDER — CLOBETASOL PROPIONATE 0.05 % EX OINT
TOPICAL_OINTMENT | CUTANEOUS | Status: DC
Start: 2019-09-24 — End: 2019-09-24

## 2019-09-24 MED ORDER — ACETAMINOPHEN 500 MG PO TABS
1000.00 | ORAL_TABLET | ORAL | Status: DC
Start: 2019-09-24 — End: 2019-09-24

## 2019-09-24 MED ORDER — IPRATROPIUM BROMIDE 0.02 % IN SOLN
500.00 | RESPIRATORY_TRACT | Status: DC
Start: 2019-09-24 — End: 2019-09-24

## 2019-09-24 MED ORDER — METHOCARBAMOL 500 MG PO TABS
750.00 | ORAL_TABLET | ORAL | Status: DC
Start: 2019-09-24 — End: 2019-09-24

## 2019-09-24 MED ORDER — GABAPENTIN 300 MG PO CAPS
300.00 | ORAL_CAPSULE | ORAL | Status: DC
Start: 2019-09-24 — End: 2019-09-24

## 2019-09-24 MED ORDER — PHENOL 1.4 % MT LIQD
2.00 | OROMUCOSAL | Status: DC
Start: ? — End: 2019-09-24

## 2019-09-24 MED ORDER — TRAZODONE HCL 50 MG PO TABS
50.00 | ORAL_TABLET | ORAL | Status: DC
Start: 2019-09-24 — End: 2019-09-24

## 2019-09-24 MED ORDER — FOLIC ACID 1 MG PO TABS
1.00 | ORAL_TABLET | ORAL | Status: DC
Start: 2019-09-25 — End: 2019-09-24

## 2019-09-24 MED ORDER — GENERIC EXTERNAL MEDICATION
Status: DC
Start: ? — End: 2019-09-24

## 2019-09-24 MED ORDER — ALBUTEROL SULFATE (2.5 MG/3ML) 0.083% IN NEBU
2.50 | INHALATION_SOLUTION | RESPIRATORY_TRACT | Status: DC
Start: ? — End: 2019-09-24

## 2019-09-24 MED ORDER — FLUTICASONE PROPIONATE 50 MCG/ACT NA SUSP
1.00 | NASAL | Status: DC
Start: 2019-09-24 — End: 2019-09-24

## 2019-09-24 MED ORDER — MENTHOL 9.1 MG MT LOZG
1.00 | LOZENGE | OROMUCOSAL | Status: DC
Start: ? — End: 2019-09-24

## 2019-09-24 MED ORDER — CLONAZEPAM 0.5 MG PO TABS
0.50 | ORAL_TABLET | ORAL | Status: DC
Start: 2019-09-24 — End: 2019-09-24

## 2019-09-24 MED ORDER — DSS 100 MG PO CAPS
100.00 | ORAL_CAPSULE | ORAL | Status: DC
Start: 2019-09-24 — End: 2019-09-24

## 2019-09-24 MED FILL — OXYCODONE 5 MG TABLET: 5 days supply | Qty: 40 | Fill #0 | Status: AC

## 2019-09-24 MED FILL — DOK 100 MG CAPSULE: 14 days supply | Qty: 28 | Fill #0 | Status: AC

## 2019-09-24 MED FILL — ACETAMINOPHEN 500 MG TABLET: 14 days supply | Qty: 84 | Fill #0 | Status: AC

## 2019-09-24 MED FILL — NARCAN 4 MG/ACTUATION NASAL SPRAY: 2 days supply | Qty: 2 | Fill #0 | Status: AC

## 2019-09-24 MED FILL — METHOCARBAMOL 750 MG TABLET: 7 days supply | Qty: 21 | Fill #0 | Status: AC

## 2019-09-24 NOTE — Unmapped (Signed)
A/Ox4, neurovascular assessment WDL, bed at >30 degrees, PRN pain meds managing pain, PO intake and UOP adequate for the shift, no c/o of nausea/vomiting this shift, neck collar remain in place, VSS, O2 Sats maintained on RA, will continue to monitor.    Problem: Adult Inpatient Plan of Care  Goal: Plan of Care Review  Outcome: Progressing  Goal: Patient-Specific Goal (Individualization)  Outcome: Progressing  Goal: Absence of Hospital-Acquired Illness or Injury  Outcome: Progressing  Goal: Optimal Comfort and Wellbeing  Outcome: Progressing  Goal: Readiness for Transition of Care  Outcome: Progressing  Goal: Rounds/Family Conference  Outcome: Progressing     Problem: Fall Injury Risk  Goal: Absence of Fall and Fall-Related Injury  Outcome: Progressing     Problem: Bleeding (Spinal Surgery)  Goal: Absence of Bleeding  Outcome: Progressing     Problem: Bowel Elimination Impaired (Spinal Surgery)  Goal: Effective Bowel Elimination  Outcome: Progressing     Problem: Functional Ability Impaired (Spinal Surgery)  Goal: Optimal Functional Ability  Outcome: Progressing     Problem: Infection (Spinal Surgery)  Goal: Absence of Infection Signs/Symptoms  Outcome: Progressing     Problem: Neurologic Impairment (Spinal Surgery)  Goal: Optimal Neurologic Function  Outcome: Progressing     Problem: Ongoing Anesthesia Effects (Spinal Surgery)  Goal: Anesthesia/Sedation Recovery  Outcome: Progressing     Problem: Pain (Spinal Surgery)  Goal: Acceptable Pain Control  Outcome: Progressing     Problem: Postoperative Nausea and Vomiting (Spinal Surgery)  Goal: Nausea and Vomiting Relief  Outcome: Progressing     Problem: Postoperative Urinary Retention (Spinal Surgery)  Goal: Effective Urinary Elimination  Outcome: Progressing     Problem: Wound  Goal: Optimal Wound Healing  Outcome: Progressing     Problem: COPD Comorbidity  Goal: Maintenance of COPD Symptom Control  Outcome: Progressing

## 2019-09-24 NOTE — Unmapped (Signed)
Surgical dressing to anterior neck remains C/D/I. Pain controlled with scheduled and PRN medications as ordered. Voiding with help to bathroom. Tolerating diet well. Patient up to the chair with PT during this shift with use of walker. Patient denies any needs at this time, will continue to monitor.    Problem: Adult Inpatient Plan of Care  Goal: Plan of Care Review  Outcome: Progressing  Goal: Patient-Specific Goal (Individualization)  Outcome: Progressing  Goal: Absence of Hospital-Acquired Illness or Injury  Outcome: Progressing  Goal: Optimal Comfort and Wellbeing  Outcome: Progressing  Goal: Readiness for Transition of Care  Outcome: Progressing  Goal: Rounds/Family Conference  Outcome: Progressing     Problem: Fall Injury Risk  Goal: Absence of Fall and Fall-Related Injury  Outcome: Progressing     Problem: Bleeding (Spinal Surgery)  Goal: Absence of Bleeding  Outcome: Progressing     Problem: Bowel Elimination Impaired (Spinal Surgery)  Goal: Effective Bowel Elimination  Outcome: Progressing     Problem: Functional Ability Impaired (Spinal Surgery)  Goal: Optimal Functional Ability  Outcome: Progressing     Problem: Infection (Spinal Surgery)  Goal: Absence of Infection Signs/Symptoms  Outcome: Progressing     Problem: Neurologic Impairment (Spinal Surgery)  Goal: Optimal Neurologic Function  Outcome: Progressing     Problem: Ongoing Anesthesia Effects (Spinal Surgery)  Goal: Anesthesia/Sedation Recovery  Outcome: Progressing     Problem: Pain (Spinal Surgery)  Goal: Acceptable Pain Control  Outcome: Progressing     Problem: Postoperative Nausea and Vomiting (Spinal Surgery)  Goal: Nausea and Vomiting Relief  Outcome: Progressing     Problem: Postoperative Urinary Retention (Spinal Surgery)  Goal: Effective Urinary Elimination  Outcome: Progressing     Problem: Wound  Goal: Optimal Wound Healing  Outcome: Progressing     Problem: COPD Comorbidity  Goal: Maintenance of COPD Symptom Control Outcome: Progressing

## 2019-09-24 NOTE — Unmapped (Signed)
Surgical dressing to anterior neck remains C/D/I. Pain controlled with PRN medications as ordered. Voiding independently. Soft collar remains in place at all times. Patient has cleared PT and OT, and is now ready for discharge.     Problem: Adult Inpatient Plan of Care  Goal: Plan of Care Review  Outcome: Progressing  Goal: Patient-Specific Goal (Individualization)  Outcome: Progressing  Goal: Absence of Hospital-Acquired Illness or Injury  Outcome: Progressing  Goal: Optimal Comfort and Wellbeing  Outcome: Progressing  Goal: Readiness for Transition of Care  Outcome: Progressing  Goal: Rounds/Family Conference  Outcome: Progressing     Problem: Fall Injury Risk  Goal: Absence of Fall and Fall-Related Injury  Outcome: Progressing     Problem: Bleeding (Spinal Surgery)  Goal: Absence of Bleeding  Outcome: Progressing     Problem: Bowel Elimination Impaired (Spinal Surgery)  Goal: Effective Bowel Elimination  Outcome: Progressing     Problem: Functional Ability Impaired (Spinal Surgery)  Goal: Optimal Functional Ability  Outcome: Progressing     Problem: Infection (Spinal Surgery)  Goal: Absence of Infection Signs/Symptoms  Outcome: Progressing     Problem: Neurologic Impairment (Spinal Surgery)  Goal: Optimal Neurologic Function  Outcome: Progressing     Problem: Ongoing Anesthesia Effects (Spinal Surgery)  Goal: Anesthesia/Sedation Recovery  Outcome: Progressing     Problem: Pain (Spinal Surgery)  Goal: Acceptable Pain Control  Outcome: Progressing     Problem: Postoperative Nausea and Vomiting (Spinal Surgery)  Goal: Nausea and Vomiting Relief  Outcome: Progressing     Problem: Postoperative Urinary Retention (Spinal Surgery)  Goal: Effective Urinary Elimination  Outcome: Progressing     Problem: Wound  Goal: Optimal Wound Healing  Outcome: Progressing     Problem: COPD Comorbidity  Goal: Maintenance of COPD Symptom Control  Outcome: Progressing

## 2019-09-24 NOTE — Unmapped (Signed)
Post Op Call:    Called patient in hospital  Doing well overall.    Mild dysphagia  Encouraged ambulation.  Encouraged minimization of narcotics.  Warning symptoms given  Routine follow-up as scheduled.  Patient/Family has my personal cell # in case there are any issues.

## 2019-09-26 ENCOUNTER — Encounter: Admit: 2019-09-26 | Discharge: 2019-10-13 | Payer: MEDICARE

## 2019-09-26 DIAGNOSIS — M539 Dorsopathy, unspecified: Principal | ICD-10-CM

## 2019-09-26 NOTE — Unmapped (Signed)
COMMUNITY HEALTH WORKER  Outreach Note    09/26/2019    Situation:    Referral received from: Other: Nurse  Reason for referral: Transportation  Background:    After speaking with patient, they state the following history about their needs:  Patient may need transportation to her appointment and wanted to have a Plan B just in case her transportation fell through. Patient didn't want to wait until the last minute to start planning.  Assessment:    CHW discussed the following topics with patient:   Transportation  Recommendations:  CHW provided the patient with the Medicaid Transportation telephone number: Medicaid Transportation Coordinator: Maceo Pro, [phone].    Additional Comments/Actions:  None  Note Routed: No.  Routed to:N/A    Earnestine Leys

## 2019-09-26 NOTE — Telephone Encounter (Signed)
Tried calling patient. No answer and no VM, will try to call again later.  

## 2019-09-27 NOTE — Telephone Encounter (Signed)
Patient notified of [name]'s message. States she is going to try to find out some more information.

## 2019-10-07 ENCOUNTER — Encounter
Admit: 2019-10-07 | Discharge: 2019-10-08 | Payer: MEDICARE | Attending: Orthopaedic Surgery | Primary: Orthopaedic Surgery

## 2019-10-07 ENCOUNTER — Encounter: Admit: 2019-10-07 | Discharge: 2019-10-08 | Payer: MEDICARE

## 2019-10-07 DIAGNOSIS — M4722 Other spondylosis with radiculopathy, cervical region: Principal | ICD-10-CM

## 2019-10-07 DIAGNOSIS — M4712 Other spondylosis with myelopathy, cervical region: Principal | ICD-10-CM

## 2019-10-07 DIAGNOSIS — G9520 Unspecified cord compression: Principal | ICD-10-CM

## 2019-10-07 MED ORDER — METHOCARBAMOL 750 MG TABLET
ORAL_TABLET | Freq: Three times a day (TID) | ORAL | 0 refills | 7.00000 days | Status: CP
Start: 2019-10-07 — End: 2019-10-14

## 2019-10-07 MED ORDER — OXYCODONE 5 MG TABLET
ORAL_TABLET | Freq: Four times a day (QID) | ORAL | 0 refills | 5.00000 days | Status: CP | PRN
Start: 2019-10-07 — End: 2019-10-12

## 2019-10-07 NOTE — Unmapped (Addendum)
Your diagnosis: Cervical fusion    Recommendations/Plan  ?? Medications: We have prescribed Oxycodone and Robaxin to help your pain and muscle spasms  ?? Activity: Activities as tolerated.  ?? Discontinue collar (brace).    ?? Gradual cautious return to normal activities.  ?? Avoid impact activities (running, jumping) and heavy machinery Academic librarian, guns, motorcycles, etc.)  ?? Recommend progressive/daily walking exercise program  ?? Imaging studies: None needed at next visit  ?? Follow-up: In-Person Return Visit. To be scheduled in about 1 month(s).    Contact our team electronically via MyChart message to [facility] Clinical Staff.    Contact our team at [phone] or [phone] with any questions/concerns.
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
## 2019-10-12 NOTE — Unmapped (Signed)
FYI  Per policy I am notifying you that this pt refused today's skilled nsg visit.    Army Chaco LPN [facility]

## 2019-10-14 ENCOUNTER — Ambulatory Visit: Payer: Medicare Other | Admitting: Nurse Practitioner

## 2019-10-14 ENCOUNTER — Encounter: Admit: 2019-10-14 | Discharge: 2019-10-15 | Payer: MEDICARE

## 2019-10-14 DIAGNOSIS — G9520 Unspecified cord compression: Principal | ICD-10-CM

## 2019-10-14 DIAGNOSIS — M79602 Pain in left arm: Secondary | ICD-10-CM

## 2019-10-14 DIAGNOSIS — M79601 Pain in right arm: Principal | ICD-10-CM

## 2019-10-14 DIAGNOSIS — Z79899 Other long term (current) drug therapy: Principal | ICD-10-CM

## 2019-10-14 DIAGNOSIS — L409 Psoriasis, unspecified: Principal | ICD-10-CM

## 2019-10-14 DIAGNOSIS — G959 Disease of spinal cord, unspecified: Principal | ICD-10-CM

## 2019-10-14 MED ORDER — ACITRETIN 10 MG CAPSULE
ORAL_CAPSULE | Freq: Every day | ORAL | 1 refills | 30 days | Status: CP
Start: 2019-10-14 — End: ?

## 2019-10-14 MED ORDER — MUPIROCIN 2 % TOPICAL OINTMENT
Freq: Two times a day (BID) | TOPICAL | 3 refills | 0.00000 days | Status: CP
Start: 2019-10-14 — End: ?

## 2019-10-14 NOTE — Unmapped (Signed)
We will start acitretin 10 mg daily.   Please have your labs checked.   Patient Education        acitretin  Pronunciation:  A si TRE tin  Brand:  Soriatane  What is the most important information I should know about acitretin?  Acitretin can cause severe birth defects. Do not use acitretin if you are pregnant or if you might become pregnant within 3 years after you stop taking this medicine.   You must use effective birth control to avoid getting pregnant while taking acitretin and for at least 3 years after your last dose. You will need pregnancy tests at regular intervals to make sure you are not pregnant.  Women who are able to get pregnant must not drink alcohol while taking acitretin and for at least 2 months after the last dose. Alcohol can cause acitretin to convert to another substance in your body that can take 3 years or longer to clear from your body.  Men or women should not donate blood while taking acitretin and for at least 3 years after the last dose. If donated blood containing acitretin is given to a pregnant woman, it could cause birth defects.  Acitretin can cause serious liver problems. Stop taking this medicine and call your doctor right away if you have symptoms such as nausea, vomiting, loss of appetite, dark urine, or jaundice (yellowing of your skin or eyes).  What is acitretin?  Acitretin is a retinoid, which is a form of vitamin A.  Acitretin is used to treat severe psoriasis in adults. Acitretin is not a cure for psoriasis, and you may relapse after you stop taking this medication.  Acitretin may also be used for purposes not listed in this medication guide.  What should I discuss with my healthcare provider before taking acitretin?  You should not take this medicine if you are allergic to any retinoid (acitretin, isotretinoin, tretinoin, Accutane, Claravis, Myorisan, Refissa, Renova, Retin-A, and others), or if:  ?? you have severe liver disease or severe kidney disease;  ?? you have high levels of triglycerides (a type of fat) in your blood;  ?? you are pregnant or breast-feeding;  ?? you are also using methotrexate; or  ?? you also use a tetracycline antibiotic (such as demeclocycline, doxycycline, minocycline, tetracycline, and others).  Acitretin is available to women only under an agreement that you will use approved birth control methods and undergo required pregnancy testing while taking this medicine and for at least 3 years after your last dose.   For women taking acitretin who have not had a hysterectomy or have not gone completely through menopause: Before you start taking acitretin you must have 2 negative pregnancy tests (when your doctor first prescribes acitretin, and again during the first 5 days of your menstrual period just before you start taking this medicine). You will also need pregnancy tests every month while you are taking acitretin, and every 3 months for at least 3 years after your last dose.  Acitretin can cause severe birth defects. Do not use acitretin if you are pregnant or if you might become pregnant within 3 years after you stop taking this medicine. You must use 2 effective forms of birth control to avoid getting pregnant while taking acitretin and for at least 3 years after your last dose.   The first birth control method should include one of the following forms:  ?? birth control pills (but not the mini-pill);  ?? an intrauterine device (IUD);  ??  birth control shots, inserts, skin patches, or implants;  ?? a tubal ligation; or  ?? your female partner's vasectomy.  The second birth control method should include one of the following forms:  ?? a diaphragm or cervical cap used with a spermicide;  ?? a latex condom used with or without a spermicide; or  ?? a vaginal sponge that contains a spermicide.  Start using both forms of birth control at least 1 month before you start taking acitretin. Continue using both forms while you are taking acitretin and for at least 3 years after your last dose. Use both forms of birth control together every time you have sex.  While taking acitretin and for at least 3 years after your last dose: Call your doctor right away if you think you might be pregnant, if you miss a period, or if you have sex without using both forms of birth control. You may also call the MedWatch program at 1-800-FDA-1088. Consider using emergency contraception (morning-after pill) if you have sex without using both of the 2 recommended birth control methods.  If you are not menstruating,  you should have a pregnancy test at least 11 days after you last had sex without using 2 effective forms of birth control.  Do not miss a scheduled pregnancy test or you may not be able to continue taking acitretin.  Acitretin can pass into breast milk and may cause serious side effects in the nursing baby. Do not breast-feed while using this medicine.  To make sure acitretin is safe for you, tell your doctor if you have ever had:  ?? phototherapy;  ?? kidney or liver disease;  ?? heart disease;  ?? high cholesterol or triglycerides (a type of fat in the blood);  ?? diabetes (you may need to check your blood sugar more often);  ?? a habit of drinking large amounts of alcohol;  ?? depression; or  ?? if you have ever used a medicine called etretinate (Tegison, Tigason).  How should I take acitretin?  Follow all directions on your prescription label. Do not take this medicine in larger or smaller amounts or for longer than recommended.  Take acitretin with food.  It may take 2 to 3 months before your symptoms improve, and your psoriasis may even get worse when you start taking acitretin. Keep using the medication as directed and tell your doctor if your symptoms do not improve.  While using acitretin, you may need frequent blood tests. If you use this medicine long-term, you may need additional medical tests, including x-rays.  NEVER SHARE THIS MEDICINE WITH ANOTHER PERSON, even if they have the same symptoms you have.  Store at room temperature away from moisture, heat, and light.  What happens if I miss a dose?  Skip the missed dose and take the next regularly scheduled dose. Do not use extra medicine to make up the missed dose.  What happens if I overdose?  Seek emergency medical attention or call the Poison Help line at (940)622-0087.  Overdose may cause headaches or severe dizziness.  What should I avoid while taking acitretin?  Women who are able to get pregnant must not drink alcohol while taking acitretin and for at least 2 months after the last dose. Any alcohol swallowed during this time can cause acitretin to convert to another substance in your body that could take 3 years or longer to clear from your body. Read the labels of all foods and medicines you consume to make sure they  do not contain alcohol.  Both men and women should not donate blood while taking acitretin and for at least 3 years after the last dose. If donated blood containing acitretin is given to a pregnant woman, it could cause birth defects.  Avoid taking more than the minimum recommended daily allowance of vitamin A. Acitretin is a form of vitamin A, and many multivitamin products or dietary supplements contain vitamin A. Taking certain products together can cause you to get too much vitamin A.  Avoid exposure to sunlight or tanning beds. Acitretin can make you sunburn more easily. Wear protective clothing and use sunscreen (SPF 30 or higher) when you are outdoors.  Acitretin may impair your vision, especially at night. Be careful if you drive or do anything that requires you to see clearly.  What are the possible side effects of acitretin?  Get emergency medical help if you have signs of an allergic reaction: hives; difficult breathing; swelling of your face, lips, tongue, or throat.  Stop using acitretin and call your doctor at once if you have:  ?? mood changes --depression, aggression, unusual thoughts or behavior, thoughts of hurting yourself;  ?? heart attack or stroke symptoms --chest pain, dizziness, nausea, feeling short of breath, sudden numbness or weakness (especially on one side of the body), sudden severe headache, problems with speech or balance, swelling or warmth in one or both legs;  ?? high blood sugar --increased thirst, increased urination, dry mouth, fruity breath odor, headache, blurred vision;  ?? increased pressure inside the skull --severe headaches, ringing in your ears, dizziness, nausea, vision problems, pain behind your eyes;  ?? liver problems --nausea, vomiting, loss of appetite, dark urine, or jaundice (yellowing of your skin or eyes);  ?? problems with your bones or muscles --loss of feeling in your hands or feet, trouble moving, pain in your back, joints, muscles, or bones;  ?? serious skin problems --itching, redness, pain, swelling or peeling of your skin; or  ?? signs of a blood vessel problem --sudden swelling, rapid weight gain, fever, muscle pain, feeling light-headed.  Common side effects may include:  ?? chapped lips, dry mouth;  ?? itchy or scaly skin;  ?? weak nails, fragile skin;  ?? peeling skin on your hands and feet;  ?? hair loss;  ?? dry eyes, discomfort while wearing contact lenses;  ?? dry or runny nose, nosebleeds; or  ?? joint pain, tight muscles.  This is not a complete list of side effects and others may occur. Call your doctor for medical advice about side effects. You may report side effects to FDA at 1-800-FDA-1088.  What other drugs will affect acitretin?  Tell your doctor about all your current medicines and any you start or stop using, especially:  ?? glyburide;  ?? phenytoin;  ?? St. John's wort; or  ?? hormone replacement therapy or birth control pills (especially minipills).  This list is not complete. Other drugs may interact with acitretin, including prescription and over-the-counter medicines, vitamins, and herbal products. Not all possible interactions are listed in this medication guide. Where can I get more information?  Your doctor or pharmacist can provide more information about acitretin.  Remember, keep this and all other medicines out of the reach of children, never share your medicines with others, and use this medication only for the indication prescribed.   Every effort has been made to ensure that the information provided by [employer] ('[employer]') is accurate, up-to-date, and complete, but no guarantee is made to  that effect. Drug information contained herein may be time sensitive. [employer] information has been compiled for use by healthcare practitioners and consumers in the [place] and therefore [employer] does not warrant that uses outside of the [place] are appropriate, unless specifically indicated otherwise. [employer]'s drug information does not endorse drugs, diagnose patients or recommend therapy. [employer]'s drug information is an Investment banker, corporate to assist licensed healthcare practitioners in caring for their patients and/or to serve consumers viewing this service as a supplement to, and not a substitute for, the expertise, skill, knowledge and judgment of healthcare practitioners. The absence of a warning for a given drug or drug combination in no way should be construed to indicate that the drug or drug combination is safe, effective or appropriate for any given patient. [employer] does not assume any responsibility for any aspect of healthcare administered with the aid of information [employer] provides. The information contained herein is not intended to cover all possible uses, directions, precautions, warnings, drug interactions, allergic reactions, or adverse effects. If you have questions about the drugs you are taking, check with your doctor, nurse or pharmacist.  Copyright [phone])376-2495 [employer] Version: 6.01. Revision date: 07/14/2016.  Care instructions adapted under license by [facility]. If you have questions about a medical condition or this instruction, always ask your healthcare professional. [employer] disclaims any warranty or liability for your use of this information.

## 2019-10-17 NOTE — Unmapped (Signed)
Called [employer] for update on nbUVB unit. Per representative, delay is due to: 1.  Not being able to reach patient over phone after attempt on 1/6 and no voice mailbox set up, 2.  Need for clinical notes.  Will have clinical notes faxed to [phone] as per request.  Requested that [employer] reach back out to the patient.

## 2019-10-18 NOTE — Unmapped (Signed)
This patient has been disenrolled from the [facility] specialty pharmacy services due to medication discontinuation resulting from side effect intolerance. Per 1/29 dermatology note, patient had GI upset and mood changes with [facility], and therapy has been stopped in favor of acitretin (sent to [facility]).     Michele Miller  [facility] Specialty Pharmacist

## 2019-10-22 DIAGNOSIS — M47812 Spondylosis without myelopathy or radiculopathy, cervical region: Principal | ICD-10-CM

## 2019-10-22 MED ORDER — CYCLOBENZAPRINE 5 MG TABLET
ORAL_TABLET | Freq: Three times a day (TID) | ORAL | 1 refills | 30.00000 days | Status: CP | PRN
Start: 2019-10-22 — End: ?

## 2019-10-22 MED ORDER — METHOCARBAMOL 750 MG TABLET
ORAL_TABLET | Freq: Three times a day (TID) | ORAL | 0 refills | 30.00000 days | Status: CP
Start: 2019-10-22 — End: 2019-10-22

## 2019-10-24 ENCOUNTER — Encounter: Payer: Self-pay | Admitting: Nurse Practitioner

## 2019-10-24 ENCOUNTER — Ambulatory Visit (INDEPENDENT_AMBULATORY_CARE_PROVIDER_SITE_OTHER): Payer: Medicare Other | Admitting: Nurse Practitioner

## 2019-10-24 ENCOUNTER — Other Ambulatory Visit: Payer: Self-pay

## 2019-10-24 ENCOUNTER — Encounter
Admit: 2019-10-24 | Discharge: 2019-10-25 | Payer: MEDICARE | Attending: Orthopaedic Surgery | Primary: Orthopaedic Surgery

## 2019-10-24 ENCOUNTER — Encounter: Admit: 2019-10-24 | Discharge: 2019-10-25 | Payer: MEDICARE

## 2019-10-24 VITALS — BP 106/73 | HR 84 | Temp 98.1°F

## 2019-10-24 DIAGNOSIS — M797 Fibromyalgia: Secondary | ICD-10-CM

## 2019-10-24 DIAGNOSIS — I700 Atherosclerosis of aorta: Secondary | ICD-10-CM

## 2019-10-24 DIAGNOSIS — Z1329 Encounter for screening for other suspected endocrine disorder: Secondary | ICD-10-CM

## 2019-10-24 DIAGNOSIS — E559 Vitamin D deficiency, unspecified: Secondary | ICD-10-CM

## 2019-10-24 DIAGNOSIS — F332 Major depressive disorder, recurrent severe without psychotic features: Secondary | ICD-10-CM | POA: Diagnosis not present

## 2019-10-24 DIAGNOSIS — E538 Deficiency of other specified B group vitamins: Secondary | ICD-10-CM

## 2019-10-24 DIAGNOSIS — F4310 Post-traumatic stress disorder, unspecified: Secondary | ICD-10-CM

## 2019-10-24 DIAGNOSIS — Z0000 Encounter for general adult medical examination without abnormal findings: Secondary | ICD-10-CM

## 2019-10-24 DIAGNOSIS — Z13220 Encounter for screening for lipoid disorders: Secondary | ICD-10-CM

## 2019-10-24 DIAGNOSIS — Z1211 Encounter for screening for malignant neoplasm of colon: Secondary | ICD-10-CM

## 2019-10-24 DIAGNOSIS — L4050 Arthropathic psoriasis, unspecified: Secondary | ICD-10-CM | POA: Diagnosis not present

## 2019-10-24 DIAGNOSIS — F17219 Nicotine dependence, cigarettes, with unspecified nicotine-induced disorders: Secondary | ICD-10-CM

## 2019-10-24 DIAGNOSIS — M4712 Other spondylosis with myelopathy, cervical region: Secondary | ICD-10-CM

## 2019-10-24 DIAGNOSIS — J410 Simple chronic bronchitis: Secondary | ICD-10-CM | POA: Diagnosis not present

## 2019-10-24 DIAGNOSIS — M4722 Other spondylosis with radiculopathy, cervical region: Secondary | ICD-10-CM

## 2019-10-24 MED ORDER — METHOCARBAMOL 500 MG TABLET
ORAL_TABLET | Freq: Three times a day (TID) | ORAL | 0 refills | 20.00000 days | Status: CP
Start: 2019-10-24 — End: ?

## 2019-10-24 MED ORDER — MECLIZINE HCL 12.5 MG PO TABS: 12.5000 mg | ORAL_TABLET | Freq: Three times a day (TID) | ORAL | 1 refills | Status: DC | PRN

## 2019-10-24 MED ORDER — SHINGRIX 50 MCG/0.5ML IM SUSR: 0.5000 mL | Freq: Once | INTRAMUSCULAR | 0 refills | Status: AC

## 2019-10-24 NOTE — Assessment & Plan Note (Signed)
I have recommended complete cessation of tobacco use. I have discussed various options available for assistance with tobacco cessation including over the counter methods (Nicotine gum, patch and lozenges). We also discussed prescription options (Chantix, Nicotine Inhaler / Nasal Spray). The patient is not interested in pursuing any prescription tobacco cessation options at this time.  

## 2019-10-24 NOTE — Assessment & Plan Note (Signed)
Chronic, ongoing.  Continue current medication regimen and collaboration with psychiatry and therapist. Denies SI/HI.  She is aware all psychiatric medications are to be filled by her psychiatrist and she needs to ensure continued care with them. °

## 2019-10-24 NOTE — Assessment & Plan Note (Signed)
Chronic, ongoing.  Continue Duloxetine for mood and pain, adjust medications as needed.  Return in 6 months

## 2019-10-24 NOTE — Assessment & Plan Note (Signed)
Chronic, ongoing.  Continue current medication regimen and collaboration with psychiatry + therapy sessions, which patient has reported benefit from.  Denies SI/HI.

## 2019-10-24 NOTE — Assessment & Plan Note (Signed)
Discussed at length with patient, will recheck lipid today and consider starting statin + taking daily baby ASA once recovered from recent surgery.  Have recommended complete cessation of smoking and educated patient on risk with smoking.

## 2019-10-24 NOTE — Assessment & Plan Note (Signed)
Continue collaboration with rheumatology & dermatology at [facility] and current medication regimen.  Reviewed recent notes. 

## 2019-10-24 NOTE — Assessment & Plan Note (Addendum)
Chronic, ongoing with improvement in symptoms with current regimen.  Continue current medication regimen and adjust as needed. Spirometry due in July.  Recommend complete cessation smoking.  Next CT due in September.  Return in 6 months.

## 2019-10-24 NOTE — Progress Notes (Signed)
BP 106/73   Pulse 84   Temp 98.1 F (36.7 C) (Oral)   SpO2 98%    Subjective:    Patient ID: Idell Pickles, female    DOB: 1961-04-12, 59 y.o.   MRN: [id number]  HPI: Emily Johnston is a 59 y.o. female presenting on 10/24/2019 for initial annual medicare wellness visit. Current medical complaints include:none  She currently lives with: self Menopausal Symptoms: no   COPD Has been smoking 3/4 PPD, has been smoking since age 18. Continues on [employer]. Had lung CT CA screening on 06/09/2019, which was benign, but did not aortic atherosclerosis and emphysema.  COPD status:stable Satisfied with current treatment?:yes Oxygen use:no Dyspnea frequency:occasional Cough frequency:occasional Rescue inhaler frequency:once at night time Limitation of activity:no Productive cough: currently Last Spirometry:03/22/2019 Pneumovax:Up to Date Influenza:Up to Date  PSORIATIC ARTHRITIS: Followed by rheumatology at [facility] and last seen 08/25/2019 and dermatology last 10/14/2019 and low dose Soriatane was started (10 MG daily).  She continues on Methotrexate weekly and folic acid.  This was started due to ongoing erythema and cracking on skin on the palms of her hands. Has been using Clobetasol which was prescribed at [facility] by rheumatology she reports, but this has not been helping and causes hands to burn.   CERVICAL SPONDYLOSIS: Had recent cervical fusion on 09/22/2019 performed at [facility] by Dr. Wynn Banker.  She is receiving home health PT.  Continues to have discomfort and on review Robaxin & Flexeril were sent in by ortho on 10/22/2019 and was instructed to see this week in ortho clinic.   FIBROMYALGIA Continues on Duloxetine.   Pain status: stable Satisfied with current treatment?: yes Previous pain specialty evaluation: no Non-narcotic analgesic meds: yes Narcotic contract:no Treatments attempted: rest, APAP, ibuprofen and physical therapy    DEPRESSION/PTSD Currently followed by  psychiatry team, [facility] in [place]. Has appointment this week with Judson Roch. Continues on Cymbalta and Klonopin. Is being followed by therapy too, which she reports benefit from.  Takes Klonopin 3 times daily. Pt is aware of risks of benzo medication use to include increased sedation, respiratory suppression, falls, dependence and cardiovascular events.  Pt would like to continue treatment as benefit determined to outweigh risk.   Mood status:stable Satisfied with current treatment?:yes Symptom severity:moderate Duration of current treatment :chronic Side effects:no Medication compliance:good compliance Psychotherapy/counseling:yescurrent Depressed mood:yes Anxious mood:yes Anhedonia:no Significant weight loss or gain:no Insomnia:yeshard to stay asleep Fatigue:no Feelings of worthlessness or guilt:no Impaired concentration/indecisiveness:yes Suicidal ideations:no Hopelessness:no Crying spells:no Depression screen [employer] 2/9 10/24/2019 07/13/2019 06/01/2019 03/22/2019 02/22/2019  Decreased Interest 2 2 2 3 3   Down, Depressed, Hopeless 3 3 3 3 3   PHQ - 2 Score 5 5 5 6 6   Altered sleeping 1 2 3 3 1   Tired, decreased energy 1 3 2 3 3   Change in appetite 1 2 2 3 3   Feeling bad or failure about yourself  1 3 3 3 3   Trouble concentrating 1 3 3 3 3   Moving slowly or fidgety/restless 0 2 3 2 1   Suicidal thoughts 0 0 0 0 0  PHQ-9 Score 10 20 21 23 20   Difficult doing work/chores Somewhat difficult - Somewhat difficult Somewhat difficult Very difficult   GAD 7 : Generalized Anxiety Score 06/01/2019 03/22/2019 02/22/2019  Nervous, Anxious, on Edge 2 3 3   Control/stop worrying 1 3 3   Worry too much - different things 1 3 3   Trouble relaxing 1 3 3   Restless 1 3 1   Easily annoyed or irritable  0 2 2  Afraid - awful might happen 0 2 1  Total GAD 7 Score 6 19 16   Anxiety Difficulty Somewhat difficult Very difficult Very difficult     The patient does not have a  history of falls. I did not complete a risk assessment for falls. A plan of care for falls was not documented.   Past Medical History:  Past Medical History:  Diagnosis Date  . Anxiety   . Arthritis   . COPD (chronic obstructive pulmonary disease) ([place])   . Depression   . Fibromyalgia   . IBS (irritable bowel syndrome)   . PTSD (post-traumatic stress disorder)     Surgical History:  Past Surgical History:  Procedure Laterality Date  . ABDOMINAL HYSTERECTOMY    . CARPAL TUNNEL RELEASE    . KNEE SURGERY      Medications:  Current Outpatient Medications on File Prior to Visit  Medication Sig  . acitretin (SORIATANE) 10 MG capsule Take 10 mg by mouth daily.  . cetirizine (ZYRTEC) 10 MG tablet Take 10 mg by mouth daily.  . citalopram (CELEXA) 20 MG tablet Take 20 mg by mouth daily.  . clobetasol cream (TEMOVATE) 6.94 % Apply 1 application topically as needed.  . clonazePAM (KLONOPIN) 1 MG tablet Take 1 mg by mouth as needed for anxiety. 3 times daily as needed  . Dexlansoprazole 30 MG capsule Take 60 mg by mouth daily.   . DULoxetine (CYMBALTA) 60 MG capsule Take 2 capsules by mouth daily.  . fluticasone (FLONASE) 50 MCG/ACT nasal spray Place 2 sprays into both nostrils daily.  . folic acid (FOLVITE) 1 MG tablet Take 1 mg by mouth daily.  Marland Kitchen gabapentin (NEURONTIN) 300 MG capsule Take 300 mg by mouth 3 (three) times daily.  . methocarbamol (ROBAXIN) 750 MG tablet Take 750 mg by mouth 3 (three) times daily.  . methotrexate 250 MG/10ML injection Inject 25 mg into the vein once a week.  . Multiple Vitamin (MULTIVITAMIN) tablet Take 1 tablet by mouth daily.  . naloxone (NARCAN) 4 MG/0.1ML LIQD nasal spray kit One spray in either nostril once for known/suspected opioid overdose. May repeat every 2-3 minutes in alternating nostril til EMS arrives  . senna (SENOKOT) 8.6 MG tablet Take 1 tablet by mouth as needed.  . Tiotropium Bromide-Olodaterol (STIOLTO RESPIMAT) 2.5-2.5 MCG/ACT AERS  Inhale 2 puffs into the lungs daily.  . traZODone (DESYREL) 50 MG tablet Take 2 tablets by mouth daily.  Marland Kitchen albuterol (PROVENTIL HFA;VENTOLIN HFA) 108 (90 Base) MCG/ACT inhaler Inhale 2 puffs into the lungs every 6 (six) hours as needed for wheezing or shortness of breath.  Marland Kitchen tiZANidine (ZANAFLEX) 4 MG tablet Take 4 mg by mouth 3 (three) times daily as needed for muscle spasms.   No current facility-administered medications on file prior to visit.    Allergies:  No Known Allergies  Social History:  Social History   Socioeconomic History  . Marital status: Legally Separated    Spouse name: Not on file  . Number of children: Not on file  . Years of education: Not on file  . Highest education level: Not on file  Occupational History  . Not on file  Tobacco Use  . Smoking status: Current Every Day Smoker    Packs/day: 1.00    Years: 41.00    Pack years: 41.00    Types: Cigarettes  . Smokeless tobacco: Never Used  Substance and Sexual Activity  . Alcohol use: No  . Drug use: No  .  Sexual activity: Not on file  Other Topics Concern  . Not on file  Social History Narrative  . Not on file   Social Determinants of Health   Financial Resource Strain: Medium Risk  . Difficulty of Paying Living Expenses: Somewhat hard  Food Insecurity: No Food Insecurity  . Worried About Charity fundraiser in the Last Year: Never true  . Ran Out of Food in the Last Year: Never true  Transportation Needs: No Transportation Needs  . Lack of Transportation (Medical): No  . Lack of Transportation (Non-Medical): No  Physical Activity: Inactive  . Days of Exercise per Week: 0 days  . Minutes of Exercise per Session: 0 min  Stress: Stress Concern Present  . Feeling of Stress : To some extent  Social Connections: Unknown  . Frequency of Communication with Friends and Family: Twice a week  . Frequency of Social Gatherings with Friends and Family: Twice a week  . Attends Religious Services: Never   . Active Member of Clubs or Organizations: No  . Attends Archivist Meetings: Never  . Marital Status: Not on file  Intimate Partner Violence: Not At Risk  . Fear of Current or Ex-Partner: No  . Emotionally Abused: No  . Physically Abused: No  . Sexually Abused: No   Social History   Tobacco Use  Smoking Status Current Every Day Smoker  . Packs/day: 1.00  . Years: 41.00  . Pack years: 41.00  . Types: Cigarettes  Smokeless Tobacco Never Used   Social History   Substance and Sexual Activity  Alcohol Use No    Family History:  History reviewed. No pertinent family history.  Past medical history, surgical history, medications, allergies, family history and social history reviewed with patient today and changes made to appropriate areas of the chart.   Review of Systems - negative All other ROS negative except what is listed above and in the HPI.      Objective:    BP 106/73   Pulse 84   Temp 98.1 F (36.7 C) (Oral)   SpO2 98%   Wt Readings from Last 3 Encounters:  09/06/19 152 lb (68.9 kg)  06/09/19 134 lb (60.8 kg)  06/01/19 136 lb (61.7 kg)    Physical Exam Constitutional:      General: She is awake. She is not in acute distress.    Appearance: She is well-developed. She is not ill-appearing.  HENT:     Head: Normocephalic and atraumatic.     Right Ear: Hearing, tympanic membrane, ear canal and external ear normal. No drainage.     Left Ear: Hearing, tympanic membrane, ear canal and external ear normal. No drainage.     Nose: Nose normal.     Right Sinus: No maxillary sinus tenderness or frontal sinus tenderness.     Left Sinus: No maxillary sinus tenderness or frontal sinus tenderness.     Mouth/Throat:     Mouth: Mucous membranes are moist.     Pharynx: Oropharynx is clear. Uvula midline. No pharyngeal swelling, oropharyngeal exudate or posterior oropharyngeal erythema.  Eyes:     General: Lids are normal.        Right eye: No discharge.         Left eye: No discharge.     Extraocular Movements: Extraocular movements intact.     Conjunctiva/sclera: Conjunctivae normal.     Pupils: Pupils are equal, round, and reactive to light.     Visual Fields: Right eye visual  fields normal and left eye visual fields normal.  Neck:     Thyroid: No thyromegaly.     Vascular: No carotid bruit.     Trachea: Trachea normal.  Cardiovascular:     Rate and Rhythm: Normal rate and regular rhythm.     Heart sounds: Normal heart sounds. No murmur. No gallop.   Pulmonary:     Effort: Pulmonary effort is normal. No accessory muscle usage or respiratory distress.     Breath sounds: Normal breath sounds.  Chest:     Breasts:        Right: Normal.        Left: Normal.  Abdominal:     General: Bowel sounds are normal.     Palpations: Abdomen is soft. There is no hepatomegaly or splenomegaly.     Tenderness: There is no abdominal tenderness.  Musculoskeletal:        General: Normal range of motion.     Cervical back: Normal range of motion and neck supple.     Right lower leg: No edema.     Left lower leg: No edema.  Lymphadenopathy:     Head:     Right side of head: No submental, submandibular, tonsillar, preauricular or posterior auricular adenopathy.     Left side of head: No submental, submandibular, tonsillar, preauricular or posterior auricular adenopathy.     Cervical: No cervical adenopathy.     Upper Body:     Right upper body: No supraclavicular, axillary or pectoral adenopathy.     Left upper body: No supraclavicular, axillary or pectoral adenopathy.  Skin:    General: Skin is warm and dry.     Capillary Refill: Capillary refill takes less than 2 seconds.     Findings: No rash.  Neurological:     Mental Status: She is alert and oriented to person, place, and time.     Cranial Nerves: Cranial nerves are intact.     Gait: Gait is intact.     Deep Tendon Reflexes: Reflexes are normal and symmetric.     Reflex Scores:       Brachioradialis reflexes are 2+ on the right side and 2+ on the left side.      Patellar reflexes are 2+ on the right side and 2+ on the left side. Psychiatric:        Attention and Perception: Attention normal.        Mood and Affect: Mood normal.        Speech: Speech normal.        Behavior: Behavior normal. Behavior is cooperative.        Thought Content: Thought content normal.        Judgment: Judgment normal.    EKG performed in office with rate 75, NSR and normal axis present.  Results for orders placed or performed in visit on 06/01/19  Comprehensive metabolic panel  Result Value Ref Range   Glucose 82 65 - 99 mg/dL   BUN 10 6 - 24 mg/dL   Creatinine, Ser 0.94 0.57 - 1.00 mg/dL   GFR calc non Af Amer 68 >59 mL/min/1.73   GFR calc Af Amer 78 >59 mL/min/1.73   BUN/Creatinine Ratio 11 9 - 23   Sodium 146 (H) 134 - 144 mmol/L   Potassium 4.3 3.5 - 5.2 mmol/L   Chloride 108 (H) 96 - 106 mmol/L   CO2 24 20 - 29 mmol/L   Calcium 9.1 8.7 - 10.2 mg/dL   Total Protein  6.5 6.0 - 8.5 g/dL   Albumin 4.1 3.8 - 4.9 g/dL   Globulin, Total 2.4 1.5 - 4.5 g/dL   Albumin/Globulin Ratio 1.7 1.2 - 2.2   Bilirubin Total <0.2 0.0 - 1.2 mg/dL   Alkaline Phosphatase 101 39 - 117 IU/L   AST 20 0 - 40 IU/L   ALT 12 0 - 32 IU/L  CBC with Differential/Platelet  Result Value Ref Range   WBC 8.1 3.4 - 10.8 x10E3/uL   RBC 4.05 3.77 - 5.28 x10E6/uL   Hemoglobin 13.5 11.1 - 15.9 g/dL   Hematocrit 39.4 34.0 - 46.6 %   MCV 97 79 - 97 fL   MCH 33.3 (H) 26.6 - 33.0 pg   MCHC 34.3 31.5 - 35.7 g/dL   RDW 13.3 11.7 - 15.4 %   Platelets 326 150 - 450 x10E3/uL   Neutrophils 54 Not Estab. %   Lymphs 33 Not Estab. %   Monocytes 8 Not Estab. %   Eos 4 Not Estab. %   Basos 1 Not Estab. %   Neutrophils Absolute 4.4 1.4 - 7.0 x10E3/uL   Lymphocytes Absolute 2.7 0.7 - 3.1 x10E3/uL   Monocytes Absolute 0.6 0.1 - 0.9 x10E3/uL   EOS (ABSOLUTE) 0.3 0.0 - 0.4 x10E3/uL   Basophils Absolute 0.1 0.0 - 0.2  x10E3/uL   Immature Granulocytes 0 Not Estab. %   Immature Grans (Abs) 0.0 0.0 - 0.1 x10E3/uL  Lipid Panel w/o Chol/HDL Ratio  Result Value Ref Range   Cholesterol, Total 216 (H) 100 - 199 mg/dL   Triglycerides 108 0 - 149 mg/dL   HDL 60 >39 mg/dL   VLDL Cholesterol Cal 19 5 - 40 mg/dL   LDL Chol Calc (NIH) 137 (H) 0 - 99 mg/dL  Cytology - PAP  Result Value Ref Range   High risk HPV Negative    Adequacy      Satisfactory for evaluation; transformation zone component ABSENT.   Diagnosis      - Negative for intraepithelial lesion or malignancy (NILM)   Molecular Disclaimer      APTIMA HPV assay on the Panther system is an in vitro nucleic acid   Molecular Disclaimer      amplification test for the qualitative detection of E6/E7 viral   Molecular Disclaimer      messenger RNA of High-Risk HPV types 16, 18, 31, 33, 35, 39, 45, 51, 52,   Molecular Disclaimer      56, 58, 59, 66,  68.  This test was validated and its appropriate   Molecular Disclaimer      performance characteristics determined by the reporting laboratory.   Games developer was performed at [facility]. This laboratory is   Lexicographer under the BHAL-93 as qualified to perform high Technical brewer      clinical laboratory testing. Normal Reference Range-Not Detected      Assessment & Plan:   Problem List Items Addressed This Visit      Cardiovascular and Mediastinum   Aortic atherosclerosis ([place])    Discussed at length with patient, will recheck lipid today and consider starting statin + taking daily baby ASA once recovered from recent surgery.  Have recommended complete cessation of smoking and educated patient on risk with smoking.        Respiratory   COPD (chronic obstructive pulmonary disease) ([facility])    Chronic, ongoing with improvement in symptoms with  current regimen.  Continue current medication regimen and adjust as needed. Spirometry  due in July.  Recommend complete cessation smoking.  Next CT due in September.  Return in 6 months.        Nervous and Auditory   Nicotine dependence, cigarettes, w unsp disorders    I have recommended complete cessation of tobacco use. I have discussed various options available for assistance with tobacco cessation including over the counter methods (Nicotine gum, patch and lozenges). We also discussed prescription options (Chantix, Nicotine Inhaler / Nasal Spray). The patient is not interested in pursuing any prescription tobacco cessation options at this time.       Spondylosis of cervical spine with myelopathy and radiculopathy    Recent surgery on 09/22/2019. Continue collaboration with ortho at [facility], recent notes reviewed.      Relevant Orders   CBC with Differential/Platelet     Musculoskeletal and Integument   Psoriatic arthritis ([place])    Continue collaboration with rheumatology & dermatology at [facility] and current medication regimen.  Reviewed recent notes.      Relevant Medications   methocarbamol (ROBAXIN) 750 MG tablet     Other   Severe recurrent major depression without psychotic features ([facility])    Chronic, ongoing.  Continue current medication regimen and collaboration with psychiatry and therapist. Denies SI/HI.  She is aware all psychiatric medications are to be filled by her psychiatrist and she needs to ensure continued care with them.      Relevant Medications   traZODone (DESYREL) 50 MG tablet   PTSD (post-traumatic stress disorder)    Chronic, ongoing.  Continue current medication regimen and collaboration with psychiatry + therapy sessions, which patient has reported benefit from.  Denies SI/HI.      Relevant Medications   traZODone (DESYREL) 50 MG tablet   Fibromyalgia    Chronic, ongoing.  Continue Duloxetine for mood and pain, adjust medications as needed.  Return in 6 months      Relevant Medications   traZODone (DESYREL) 50 MG tablet   methocarbamol  (ROBAXIN) 750 MG tablet    Other Visit Diagnoses    Medicare annual wellness visit, initial    -  Primary   Relevant Orders   Comprehensive metabolic panel   Lipid Panel w/o Chol/HDL Ratio   TSH   EKG 12-Lead (Completed)   CBC with Differential/Platelet   Vitamin D deficiency       Reports history of low levels, will check today and start supplement as needed.   Relevant Orders   VITAMIN D 25 Hydroxy (Vit-D Deficiency, Fractures)   Vitamin B12 deficiency       Reports history of low levels, will check today and start supplement as needed.   Relevant Orders   Vitamin B12   Thyroid disorder screen       Check thyroid labs   Screening cholesterol level       Smoker, last ASCVD 4.1%, check lipid panel today.   Relevant Orders   Lipid Panel w/o Chol/HDL Ratio   TSH   Screening for malignant neoplasm of colon       GI referral for colonoscopy   Relevant Orders   Ambulatory referral to Gastroenterology       Follow up plan: Return in about 6 months (around 04/22/2020) for COPD, Depression, Psoriasis.   LABORATORY TESTING:  - Pap smear: pap done  IMMUNIZATIONS:   - Tdap: Tetanus vaccination status reviewed: last tetanus booster within 10 years. - Influenza: Up to  date - Pneumovax: refused today -- will obtain in future - Prevnar: Not applicable - HPV: Not applicable - Zostavax vaccine: ordered  SCREENING: -Mammogram: Up to date  - Colonoscopy: Ordered today  - Bone Density: Not applicable  -Hearing Test: Not applicable  -Spirometry: performed 03/22/2019   PATIENT COUNSELING:    Did not wish to discuss advanced care planning today, will address at future visits if willing.  Advised to take 1 mg of folate supplement per day if capable of pregnancy.   Sexuality: Discussed sexually transmitted diseases, partner selection, use of condoms, avoidance of unintended pregnancy  and contraceptive alternatives.   Advised to avoid cigarette smoking.  I discussed with the  patient that most people either abstain from alcohol or drink within safe limits (<=14/week and <=4 drinks/occasion for males, <=7/weeks and <= 3 drinks/occasion for females) and that the risk for alcohol disorders and other health effects rises proportionally with the number of drinks per week and how often a drinker exceeds daily limits.  Discussed cessation/primary prevention of drug use and availability of treatment for abuse.   Diet: Encouraged to adjust caloric intake to maintain  or achieve ideal body weight, to reduce intake of dietary saturated fat and total fat, to limit sodium intake by avoiding high sodium foods and not adding table salt, and to maintain adequate dietary potassium and calcium preferably from fresh fruits, vegetables, and low-fat dairy products.    stressed the importance of regular exercise  Injury prevention: Discussed safety belts, safety helmets, smoke detector, smoking near bedding or upholstery.   Dental health: Discussed importance of regular tooth brushing, flossing, and dental visits.    NEXT MEDICARE WELLNESS DUE IN 1 YEAR WITH [facility]. Return in about 6 months (around 04/22/2020) for COPD, Depression, Psoriasis.

## 2019-10-24 NOTE — Patient Instructions (Signed)
Healthy Eating Following a healthy eating pattern may help you to achieve and maintain a healthy body weight, reduce the risk of chronic disease, and live a long and productive life. It is important to follow a healthy eating pattern at an appropriate calorie level for your body. Your nutritional needs should be met primarily through food by choosing a variety of nutrient-rich foods. What are tips for following this plan? Reading food labels  Read labels and choose the following: ? Reduced or low sodium. ? Juices with 100% fruit juice. ? Foods with low saturated fats and high polyunsaturated and monounsaturated fats. ? Foods with whole grains, such as whole wheat, cracked wheat, brown rice, and wild rice. ? Whole grains that are fortified with folic acid. This is recommended for women who are pregnant or who want to become pregnant.  Read labels and avoid the following: ? Foods with a lot of added sugars. These include foods that contain brown sugar, corn sweetener, corn syrup, dextrose, fructose, glucose, high-fructose corn syrup, honey, invert sugar, lactose, malt syrup, maltose, molasses, raw sugar, sucrose, trehalose, or turbinado sugar.  Do not eat more than the following amounts of added sugar per day:  6 teaspoons (25 g) for women.  9 teaspoons (38 g) for men. ? Foods that contain processed or refined starches and grains. ? Refined grain products, such as white flour, degermed cornmeal, white bread, and white rice. Shopping  Choose nutrient-rich snacks, such as vegetables, whole fruits, and nuts. Avoid high-calorie and high-sugar snacks, such as potato chips, fruit snacks, and candy.  Use oil-based dressings and spreads on foods instead of solid fats such as butter, stick margarine, or cream cheese.  Limit pre-made sauces, mixes, and "instant" products such as flavored rice, instant noodles, and ready-made pasta.  Try more plant-protein sources, such as tofu, tempeh, black beans,  edamame, lentils, nuts, and seeds.  Explore eating plans such as the Mediterranean diet or vegetarian diet. Cooking  Use oil to saut or stir-fry foods instead of solid fats such as butter, stick margarine, or lard.  Try baking, boiling, grilling, or broiling instead of frying.  Remove the fatty part of meats before cooking.  Steam vegetables in water or broth. Meal planning   At meals, imagine dividing your plate into fourths: ? One-half of your plate is fruits and vegetables. ? One-fourth of your plate is whole grains. ? One-fourth of your plate is protein, especially lean meats, poultry, eggs, tofu, beans, or nuts.  Include low-fat dairy as part of your daily diet. Lifestyle  Choose healthy options in all settings, including home, work, school, restaurants, or stores.  Prepare your food safely: ? Wash your hands after handling raw meats. ? Keep food preparation surfaces clean by regularly washing with hot, soapy water. ? Keep raw meats separate from ready-to-eat foods, such as fruits and vegetables. ? Cook seafood, meat, poultry, and eggs to the recommended internal temperature. ? Store foods at safe temperatures. In general:  Keep cold foods at 59F (4.4C) or below.  Keep hot foods at 159F (60C) or above.  Keep your freezer at [facility] (-17.8C) or below.  Foods are no longer safe to eat when they have been between the temperatures of 40-159F (4.4-60C) for more than 2 hours. What foods should I eat? Fruits Aim to eat 2 cup-equivalents of fresh, canned (in natural juice), or frozen fruits each day. Examples of 1 cup-equivalent of fruit include 1 small apple, 8 large strawberries, 1 cup canned fruit,  cup  dried fruit, or 1 cup 100% juice. Vegetables Aim to eat 2-3 cup-equivalents of fresh and frozen vegetables each day, including different varieties and colors. Examples of 1 cup-equivalent of vegetables include 2 medium carrots, 2 cups raw, leafy greens, 1 cup chopped  vegetable (raw or cooked), or 1 medium baked potato. Grains Aim to eat 6 ounce-equivalents of whole grains each day. Examples of 1 ounce-equivalent of grains include 1 slice of bread, 1 cup ready-to-eat cereal, 3 cups popcorn, or  cup cooked rice, pasta, or cereal. Meats and other proteins Aim to eat 5-6 ounce-equivalents of protein each day. Examples of 1 ounce-equivalent of protein include 1 egg, 1/2 cup nuts or seeds, or 1 tablespoon (16 g) peanut butter. A cut of meat or fish that is the size of a deck of cards is about 3-4 ounce-equivalents.  Of the protein you eat each week, try to have at least 8 ounces come from seafood. This includes salmon, trout, herring, and anchovies. Dairy Aim to eat 3 cup-equivalents of fat-free or low-fat dairy each day. Examples of 1 cup-equivalent of dairy include 1 cup (240 mL) milk, 8 ounces (250 g) yogurt, 1 ounces (44 g) natural cheese, or 1 cup (240 mL) fortified soy milk. Fats and oils  Aim for about 5 teaspoons (21 g) per day. Choose monounsaturated fats, such as canola and olive oils, avocados, peanut butter, and most nuts, or polyunsaturated fats, such as sunflower, corn, and soybean oils, walnuts, pine nuts, sesame seeds, sunflower seeds, and flaxseed. Beverages  Aim for six 8-oz glasses of water per day. Limit coffee to three to five 8-oz cups per day.  Limit caffeinated beverages that have added calories, such as soda and energy drinks.  Limit alcohol intake to no more than 1 drink a day for nonpregnant women and 2 drinks a day for men. One drink equals 12 oz of beer (355 mL), 5 oz of wine (148 mL), or 1 oz of hard liquor (44 mL). Seasoning and other foods  Avoid adding excess amounts of salt to your foods. Try flavoring foods with herbs and spices instead of salt.  Avoid adding sugar to foods.  Try using oil-based dressings, sauces, and spreads instead of solid fats. This information is based on general [place] nutrition guidelines. For more  information, visit [employer]. Exact amounts may vary based on your nutrition needs. Summary  A healthy eating plan may help you to maintain a healthy weight, reduce the risk of chronic diseases, and stay active throughout your life.  Plan your meals. Make sure you eat the right portions of a variety of nutrient-rich foods.  Try baking, boiling, grilling, or broiling instead of frying.  Choose healthy options in all settings, including home, work, school, restaurants, or stores. This information is not intended to replace advice given to you by your health care provider. Make sure you discuss any questions you have with your health care provider. Document Revised: 12/14/2017 Document Reviewed: 12/14/2017 Elsevier Patient Education  [place].

## 2019-10-24 NOTE — Assessment & Plan Note (Signed)
Recent surgery on 09/22/2019. Continue collaboration with ortho at [facility], recent notes reviewed.

## 2019-10-25 LAB — CBC WITH DIFFERENTIAL/PLATELET
Basophils Absolute: 0.1 10*3/uL (ref 0.0–0.2)
Basos: 1 %
EOS (ABSOLUTE): 0.1 10*3/uL (ref 0.0–0.4)
Eos: 1 %
Hematocrit: 42.6 % (ref 34.0–46.6)
Hemoglobin: 14.1 g/dL (ref 11.1–15.9)
Immature Grans (Abs): 0 10*3/uL (ref 0.0–0.1)
Immature Granulocytes: 0 %
Lymphocytes Absolute: 2.4 10*3/uL (ref 0.7–3.1)
Lymphs: 39 %
MCH: 32.1 pg (ref 26.6–33.0)
MCHC: 33.1 g/dL (ref 31.5–35.7)
MCV: 97 fL (ref 79–97)
Monocytes Absolute: 0.4 10*3/uL (ref 0.1–0.9)
Monocytes: 6 %
Neutrophils Absolute: 3.2 10*3/uL (ref 1.4–7.0)
Neutrophils: 53 %
Platelets: 290 10*3/uL (ref 150–450)
RBC: 4.39 x10E6/uL (ref 3.77–5.28)
RDW: 13.3 % (ref 11.7–15.4)
WBC: 6.1 10*3/uL (ref 3.4–10.8)

## 2019-10-25 LAB — LIPID PANEL W/O CHOL/HDL RATIO
Cholesterol, Total: 209 mg/dL — ABNORMAL HIGH (ref 100–199)
HDL: 45 mg/dL (ref >39)
LDL Chol Calc (NIH): 146 mg/dL — ABNORMAL HIGH (ref 0–99)
Triglycerides: 97 mg/dL (ref 0–149)
VLDL Cholesterol Cal: 18 mg/dL (ref 5–40)

## 2019-10-25 LAB — COMPREHENSIVE METABOLIC PANEL
ALT: 12 IU/L (ref 0–32)
AST: 19 IU/L (ref 0–40)
Albumin/Globulin Ratio: 1.7 (ref 1.2–2.2)
Albumin: 4 g/dL (ref 3.8–4.9)
Alkaline Phosphatase: 104 IU/L (ref 39–117)
BUN/Creatinine Ratio: 13 (ref 9–23)
BUN: 10 mg/dL (ref 6–24)
Bilirubin Total: 0.3 mg/dL (ref 0.0–1.2)
CO2: 27 mmol/L (ref 20–29)
Calcium: 9.6 mg/dL (ref 8.7–10.2)
Chloride: 104 mmol/L (ref 96–106)
Creatinine, Ser: 0.78 mg/dL (ref 0.57–1.00)
GFR calc Af Amer: 98 mL/min/{1.73_m2} (ref >59)
GFR calc non Af Amer: 85 mL/min/{1.73_m2} (ref >59)
Globulin, Total: 2.4 g/dL (ref 1.5–4.5)
Glucose: 80 mg/dL (ref 65–99)
Potassium: 4.4 mmol/L (ref 3.5–5.2)
Sodium: 143 mmol/L (ref 134–144)
Total Protein: 6.4 g/dL (ref 6.0–8.5)

## 2019-10-25 LAB — TSH: TSH: 0.873 u[IU]/mL (ref 0.450–4.500)

## 2019-10-25 LAB — VITAMIN B12: Vitamin B-12: 916 pg/mL (ref 232–1245)

## 2019-10-25 LAB — VITAMIN D 25 HYDROXY (VIT D DEFICIENCY, FRACTURES): Vit D, 25-Hydroxy: 39.6 ng/mL (ref 30.0–100.0)

## 2019-10-25 NOTE — Progress Notes (Signed)
Contacted via MyChart The 10-year ASCVD risk score Mikey Bussing DC Jr., et al., 2013) is: 4.8%   Values used to calculate the score:     Age: 59 years     Sex: Female     Is Non-Hispanic African American: No     Diabetic: No     Tobacco smoker: Yes     Systolic Blood Pressure: [id number] mmHg     Is BP treated: No     HDL Cholesterol: 45 mg/dL     Total Cholesterol: 209 mg/dL

## 2019-11-08 ENCOUNTER — Other Ambulatory Visit: Payer: Self-pay | Admitting: Nurse Practitioner

## 2019-11-08 DIAGNOSIS — K589 Irritable bowel syndrome without diarrhea: Principal | ICD-10-CM

## 2019-11-08 MED ORDER — DICYCLOMINE 10 MG CAPSULE
ORAL_CAPSULE | Freq: Four times a day (QID) | ORAL | 1 refills | 30.00000 days | Status: CP | PRN
Start: 2019-11-08 — End: ?
  Filled 2019-11-10: qty 240, 30d supply, fill #0

## 2019-11-08 MED ORDER — STIOLTO RESPIMAT 2.5-2.5 MCG/ACT IN AERS: 2.0000 | INHALATION_SPRAY | Freq: Every day | RESPIRATORY_TRACT | 4 refills | Status: DC

## 2019-11-08 MED FILL — BD TUBERCULIN SYRINGE 1 ML 27 X 1/2": SUBCUTANEOUS | 28 days supply | Qty: 4 | Fill #3

## 2019-11-08 MED FILL — METHOTREXATE SODIUM 25 MG/ML INJECTION SOLUTION: SUBCUTANEOUS | 28 days supply | Qty: 4 | Fill #3

## 2019-11-08 MED FILL — METHOTREXATE SODIUM 25 MG/ML INJECTION SOLUTION: 28 days supply | Qty: 4 | Fill #3 | Status: AC

## 2019-11-08 MED FILL — FLUTICASONE PROPIONATE 50 MCG/ACTUATION NASAL SPRAY,SUSPENSION: 30 days supply | Qty: 16 | Fill #5 | Status: AC

## 2019-11-08 MED FILL — BD TUBERCULIN SYRINGE 1 ML 27 X 1/2": 28 days supply | Qty: 4 | Fill #3 | Status: AC

## 2019-11-08 MED FILL — FLUTICASONE PROPIONATE 50 MCG/ACTUATION NASAL SPRAY,SUSPENSION: NASAL | 30 days supply | Qty: 16 | Fill #5

## 2019-11-08 NOTE — Unmapped (Signed)
Michele Miller has stopped her [place], but continues on methotrexate. She was off medication for a few weeks surrounding her surgery, but has started back. I am moving her back to rheumatology queue since those providers manage her methotrexate.     [facility] Clinical Assessment & Refill Coordination Note    Michele Miller, DOB: [birth year]  Phone: [phone] (home)     All above HIPAA information was verified with patient.     Was a Nurse, learning disability used for this call? No    Specialty Medication(s):   Inflammatory Disorders: methotrexate (injectable)     Current Outpatient Medications   Medication Sig Dispense Refill   ??? acitretin (SORIATANE) 10 MG capsule Take 1 capsule (10 mg total) by mouth daily. With a meal 30 capsule 1   ??? albuterol HFA 90 mcg/actuation inhaler Inhale 2 puffs by mouth every six (6) hours as needed for wheezing. 54 g 6   ??? cetirizine (ZYRTEC) 10 MG tablet Take 1 tablet (10 mg total) by mouth daily. 90 tablet 3   ??? clobetasoL (TEMOVATE) 0.05 % ointment Apply topically Two (2) times a day. 120 g 1   ??? clonazePAM (KLONOPIN) 0.5 MG tablet Take 0.5 mg by mouth Three (3) times a day.     ??? clonazePAM (KLONOPIN) 1 MG tablet Take 1 mg by mouth Three (3) times a day.     ??? cyclobenzaprine (FLEXERIL) 5 MG tablet Take 1 tablet (5 mg total) by mouth Three (3) times a day as needed for muscle spasms. 90 tablet 1   ??? diclofenac sodium (VOLTAREN) 1 % gel Apply 2 grams topically to small joint and 4 grams to large joints as needed up to 4 times daily (Patient taking differently: Apply 4 g topically 4 (four) times a day as needed for arthritis or pain. as needed for joint pain) 300 g PRN   ??? dicyclomine (BENTYL) 10 mg capsule Take 2 capsules (20 mg total) by mouth 4 (four) times a day as needed. 240 capsule 1   ??? DULoxetine (CYMBALTA) 60 MG capsule Take 2 capsules (120 mg total) by mouth daily. 60 capsule 3   ??? empty container (BD SHARPS COLLECTOR) Misc Use as directed. 1 each 3 ??? fluticasone propionate (FLONASE) 50 mcg/actuation nasal spray Use 2 sprays in each nostril daily. 16 g 4   ??? folic acid (FOLVITE) 1 MG tablet Take 1 tablet (1 mg total) by mouth daily. 90 tablet 3   ??? gabapentin (NEURONTIN) 300 MG capsule Take 1 capsule (300 mg total) by mouth Three (3) times a day. 270 capsule 0   ??? hydrocortisone 2.5 % ointment Apply 1 application topically Two (2) times a day. Apply in a thin layer to rash. Avoid getting in your eye. 60 g 1   ??? ketotifen (ZADITOR) 0.025 % (0.035 %) ophthalmic solution Administer 1 drop to both eyes Two (2) times a day. 5 mL 0   ??? meclizine (ANTIVERT) 12.5 mg tablet Take 12.5 mg by mouth Three (3) times a day as needed.      ??? methocarbamoL (ROBAXIN) 500 MG tablet Take 1 tablet (500 mg total) by mouth Three (3) times a day. 60 tablet 0   ??? methotrexate 25 mg/mL injection solution Inject 1 mL (25 mg total) under the skin once a week. 4 mL 5   ??? multivitamin with minerals tablet Take 1 tablet by mouth daily.     ??? naloxone (NARCAN) 4 mg/actuation nasal spray One spray in  either nostril once for known/suspected opioid overdose. May repeat every 2-3 minutes in alternating nostril til EMS arrives 2 each 0   ??? polyethylene glycol (MIRALAX) 17 gram packet Mix the contents of 1 packet (17 g) in 4-8 ounces of water, juice, soda, coffee, or tea and take by mouth daily for 5 days, then use as needed (Patient taking differently: Take 17 g by mouth daily as needed. ) 30 packet 3   ??? senna (SENOKOT) 8.6 mg tablet Take 1 tablet by mouth daily as needed for constipation. take 1 pill as needed for cosntipation may increase to 2 tabs if no relief     ??? syringe with needle 1 mL 27 x 1/2 Syrg Use as directed for weekly Methotrexate injections 50 each 0   ??? tiotropium bromide (SPIRIVA RESPIMAT) 1.25 mcg/actuation Mist Inhale 2 puffs (2.5 mcg) BY MOUTH daily. 4 g 5   ??? tiotropium-olodateroL (STIOLTO RESPIMAT) 2.5-2.5 mcg/actuation Mist Inhale 2 puffs daily.     ??? traZODone (DESYREL) 50 MG tablet Take 50 mg by mouth nightly. takes 2 tabs        No current facility-administered medications for this visit.         Changes to medications: [name] reports starting the following medications: flexeril - in med list    No Known Allergies    Changes to allergies: No    SPECIALTY MEDICATION ADHERENCE     Methotrexate - 0 left  Medication Adherence    Patient reported X missed doses in the last month: 2  Specialty Medication: methotrexate          Specialty medication(s) dose(s) confirmed: Regimen is correct and unchanged.     Are there any concerns with adherence? No    Adherence counseling provided? Not needed    CLINICAL MANAGEMENT AND INTERVENTION      Clinical Benefit Assessment:    Do you feel the medicine is effective or helping your condition? Yes    Clinical Benefit counseling provided? Not needed    Adverse Effects Assessment:    Are you experiencing any side effects? No    Are you experiencing difficulty administering your medicine? No    Quality of Life Assessment:    How many days over the past month did your RA  keep you from your normal activities? For example, brushing your teeth or getting up in the morning. 0    Have you discussed this with your provider? Not needed    Therapy Appropriateness:    Is therapy appropriate? Yes, therapy is appropriate and should be continued    DISEASE/MEDICATION-SPECIFIC INFORMATION      For patients on injectable medications: Patient currently has 0 doses left.  Next injection is scheduled for this week.    PATIENT SPECIFIC NEEDS     ? Does the patient have any physical, cognitive, or cultural barriers? No    ? Is the patient high risk? No     ? Does the patient require a Care Management Plan? No     ? Does the patient require physician intervention or other additional services (i.e. nutrition, smoking cessation, social work)? No      SHIPPING     Specialty Medication(s) to be Shipped:   Inflammatory Disorders: methotrexate (injectable)    Other medication(s) to be shipped: syringes, flonase, bentyl       Changes to insurance: No    Delivery Scheduled: Yes, Expected medication delivery date: Wed, Feb 24.     Medication will be  delivered via Next Day Courier to the confirmed prescription address in [facility].    The patient will receive a drug information handout for each medication shipped and additional FDA Medication Guides as required.  Verified that patient has previously received a Conservation officer, historic buildings.    All of the patient's questions and concerns have been addressed.    Lanney Gins   [facility] Specialty Pharmacist

## 2019-11-08 NOTE — Telephone Encounter (Signed)
Routing to provider  

## 2019-11-08 NOTE — Telephone Encounter (Signed)
Requested medication (s) are due for refill today:   Yes  Requested medication (s) are on the active medication list:   Yes  Future visit scheduled:   Yes    Last ordered: 03/22/2019  16 g  2 refills   Clinic note:   No protocol for this medication.   Requested Prescriptions  Pending Prescriptions Disp Refills   Tiotropium Bromide-Olodaterol (STIOLTO RESPIMAT) 2.5-2.5 MCG/ACT AERS 16 g 2    Sig: Inhale 2 puffs into the lungs daily.      Off-Protocol Failed - 11/08/2019  1:02 PM      Failed - Medication not assigned to a protocol, review manually.      Passed - Valid encounter within last 12 months    Recent Outpatient Visits           2 months ago Upper respiratory tract infection, unspecified type   [facility] [place], [place]   3 months ago Simple chronic bronchitis ([place])   [place], Bicknell T, NP   5 months ago Severe recurrent major depression without psychotic features ([place])   [place], [name] T, NP   6 months ago Simple chronic bronchitis ([place])   [place] Cannady, [name] T, NP   7 months ago Chronic obstructive pulmonary disease, unspecified COPD type ([place])   [place], Barbaraann Faster, NP       Future Appointments             In 5 months Cannady, Barbaraann Faster, NP [employer]

## 2019-11-08 NOTE — Telephone Encounter (Signed)
Medication Refill - Medication: Tiotropium Bromide-Olodaterol (STIOLTO RESPIMAT) 2.5-2.5 MCG/ACT AERS    Has the patient contacted their pharmacy? Yes.   (Agent: If no, request that the patient contact the pharmacy for the refill.) (Agent: If yes, when and what did the pharmacy advise?)  Preferred Pharmacy (with phone number or street name):  [facility] [place], [place] [address] [place] Phone:  [phone]  Fax:  [phone]       Agent: Please be advised that RX refills may take up to 3 business days. We ask that you follow-up with your pharmacy.

## 2019-11-09 ENCOUNTER — Encounter: Payer: Self-pay | Admitting: *Deleted

## 2019-11-10 MED FILL — DICYCLOMINE 10 MG CAPSULE: 30 days supply | Qty: 240 | Fill #0 | Status: AC

## 2019-11-11 ENCOUNTER — Other Ambulatory Visit: Payer: Self-pay | Admitting: Nurse Practitioner

## 2019-11-11 ENCOUNTER — Telehealth: Payer: Self-pay | Admitting: Nurse Practitioner

## 2019-11-11 MED ORDER — LORATADINE 10 MG PO TABS: 10.0000 mg | ORAL_TABLET | Freq: Every day | ORAL | 3 refills | Status: DC

## 2019-11-11 NOTE — Telephone Encounter (Signed)
Called pt to let her know that rx has been sent in, no answer, vm not set up

## 2019-11-11 NOTE — Telephone Encounter (Signed)
Sent this in

## 2019-11-11 NOTE — Telephone Encounter (Signed)
Patient is calling to request Clartin. Patient was advised that the zyrtec she may be immune to. Please advise Preferred Pharmacy-[facility]. CB- [phone]

## 2019-11-16 DIAGNOSIS — L4050 Arthropathic psoriasis, unspecified: Principal | ICD-10-CM

## 2019-11-16 MED ORDER — GABAPENTIN 300 MG CAPSULE
ORAL_CAPSULE | Freq: Three times a day (TID) | ORAL | 0 refills | 90.00000 days | Status: CP
Start: 2019-11-16 — End: 2020-11-15
  Filled 2019-11-18: qty 270, 90d supply, fill #0

## 2019-11-17 NOTE — Unmapped (Signed)
Tried x2 no answer no voicemail

## 2019-11-18 ENCOUNTER — Encounter: Admit: 2019-11-18 | Discharge: 2019-11-19 | Payer: MEDICARE

## 2019-11-18 DIAGNOSIS — L409 Psoriasis, unspecified: Principal | ICD-10-CM

## 2019-11-18 DIAGNOSIS — L309 Dermatitis, unspecified: Principal | ICD-10-CM

## 2019-11-18 DIAGNOSIS — Z79899 Other long term (current) drug therapy: Principal | ICD-10-CM

## 2019-11-18 MED ORDER — CLOBETASOL 0.05 % TOPICAL OINTMENT
Freq: Two times a day (BID) | TOPICAL | 1 refills | 0 days | Status: CP
Start: 2019-11-18 — End: 2020-11-17

## 2019-11-18 MED FILL — GABAPENTIN 300 MG CAPSULE: 90 days supply | Qty: 270 | Fill #0 | Status: AC

## 2019-11-18 NOTE — Unmapped (Signed)
DERMATOLOGY CLINIC NOTE      A/P:    Hand dermatitis in pt with hx psoriatic arthritis, flaring on maximum dose methotrexate  - on MTX 25mg  weekly for PsA per rheum; no hx cutaneous psoriasis  - DDx of psoriasis favored over ACD of hands   - patient has responded to short courses of pred in past  - she was unable to tolerate Otezla due to GI side effects and headaches   - has not received home nbUVB unit. Per [facility], they have been unable to contact patient after multiple attempts. Advised patient to call [facility] and their contact number was provided.  - she reports subjective improvement on acitretin 10 mg. If acitretin labs wnl, increase to acitretin 25 mg daily.   - continue clobetasol BID with occlusion under cotton gloves at night. Discussed with patient continued avoidance of irritants by using gloves for housework and wet work    High risk medication use- acitretin   - AST  - ALT  - Cholesterol, Total  - Triglycerides    Return in about 1 month (around 12/19/2019) for Recheck. or sooner as needed      CC:   Chief Complaint   Patient presents with   ??? Psoriasis     both hands skin peeling off around finger nails red and painful         HPI:  59 y.o. year old female seen today in follow up for hand dermatitis. Last seen 10/2019 for the same. At that visit, she was continued on methotrexate 25 mg weekly and clobetasol ointment under occlusion to the hands. She was also started on acitretin 10 mg daily. She reports some improvement on acitretin. She is tolerating it well without side effects. Was not able to start nbUVB as she has not received the phototherapy unit.     History: initially suspected ACD but subsequently more c/w PsO which has persisted despite MTX 25 (for PsA) and high potency topical steroids. She has additionally had flares of rash on face, neck, seen in ED on 12/26, and treated w/ pred taper.     Pertinent PMH: No history of skin cancer    Patient Active Problem List   Diagnosis   ??? Depressive disorder   ??? Psoriasis   ??? Tobacco use disorder   ??? Carpal tunnel syndrome   ??? Anxiety   ??? Acute stress reaction   ??? Grief at loss of child   ??? Trigger thumb of right hand   ??? Benzodiazepine misuse   ??? COPD (chronic obstructive pulmonary disease) (CMS-HCC)   ??? Chronic pain   ??? Cervical spondylosis with myelopathy and radiculopathy   ??? GI bleed   ??? Epicondylitis, lateral   ??? Trochanteric bursitis of right hip   ??? Chronic idiopathic constipation   ??? PTSD (post-traumatic stress disorder)   ??? Myelomalacia of cervical cord (CMS-HCC)     Current Outpatient Medications   Medication Instructions   ??? acitretin (SORIATANE) 10 mg, Oral, Daily (standard), With a meal   ??? albuterol HFA 90 mcg/actuation inhaler Inhale 2 puffs by mouth every six (6) hours as needed for wheezing.   ??? ARIPiprazole (ABILIFY) 5 MG tablet No dose, route, or frequency recorded.   ??? cetirizine (ZYRTEC) 10 mg, Oral, Daily (standard)   ??? clobetasoL (TEMOVATE) 0.05 % ointment Topical, 2 times a day (standard)   ??? clonazePAM (KLONOPIN) 0.5 mg, Oral, 3 times a day (standard)   ??? clonazePAM (KLONOPIN) 1 mg, Oral, 3  times a day (standard)   ??? cyclobenzaprine (FLEXERIL) 5 mg, Oral, 3 times a day PRN   ??? CYMBALTA 120 mg, Oral, Daily (standard)   ??? dexlansoprazole (DEXILANT) 60 mg, Oral   ??? diclofenac sodium (VOLTAREN) 1 % gel Apply 2 grams topically to small joint and 4 grams to large joints as needed up to 4 times daily   ??? dicyclomine (BENTYL) 20 mg, Oral, 4 times daily PRN   ??? empty container (BD SHARPS COLLECTOR) Misc Use as directed.   ??? fluticasone propionate (FLONASE) 50 mcg/actuation nasal spray Use 2 sprays in each nostril daily.   ??? folic acid (FOLVITE) 1 mg, Oral, Daily (standard)   ??? gabapentin (NEURONTIN) 300 mg, Oral, 3 times a day (standard)   ??? hydrocortisone 2.5 % ointment 1 application, Topical, 2 times a day (standard), Apply in a thin layer to rash. Avoid getting in your eye.   ??? ketotifen (ZADITOR) 0.025 % (0.035 %) ophthalmic solution 1 drop, Both Eyes, 2 times a day (standard)   ??? loratadine (CLARITIN) 10 mg tablet No dose, route, or frequency recorded.   ??? meclizine (ANTIVERT) 12.5 mg, Oral, 3 times a day PRN   ??? methocarbamoL (ROBAXIN) 500 mg, Oral, 3 times a day (standard)   ??? methotrexate 25 mg, Subcutaneous, Weekly   ??? multivitamin with minerals tablet 1 tablet, Oral, Daily (standard)   ??? naloxone (NARCAN) 4 mg/actuation nasal spray One spray in either nostril once for known/suspected opioid overdose. May repeat every 2-3 minutes in alternating nostril til EMS arrives   ??? polyethylene glycol (MIRALAX) 17 gram packet Mix the contents of 1 packet (17 g) in 4-8 ounces of water, juice, soda, coffee, or tea and take by mouth daily for 5 days, then use as needed   ??? senna (SENOKOT) 8.6 mg tablet 1-2 tablets, Oral, Daily PRN   ??? syringe with needle 1 mL 27 x 1/2 Syrg Use as directed for weekly Methotrexate injections   ??? tiotropium bromide (SPIRIVA RESPIMAT) 1.25 mcg/actuation Mist Inhale 2 puffs (2.5 mcg) BY MOUTH daily.   ??? tiotropium-olodateroL (STIOLTO RESPIMAT) 2.5-2.5 mcg/actuation Mist 2 puffs, Inhalation, Daily (standard)   ??? traZODone (DESYREL) 50 mg, Oral, Nightly   ??? [START ON 12/22/2019] varicella-zoster [id number], PF, (SHINGRIX, PF,) 50 mcg/0.5 mL SusR injection 0.5 mL, Intramuscular       SH: Lives in [place] [address]    ROS: Baseline state of health. No fevers, chills, or other skin concerns.     PE:  General: Well-developed, well-nourished, in no acute distress  Neuro: Alert and oriented, interacts appropriately  Skin:  Focal examination of BUE performed today and is pertinent for:  - fissuring and hyperkeratosis of hands, most pronounced on distal fingertips   - hyperkeratotic plaques on the bl palmar hands   - All other areas not specifically commented on are within normal limits.    The patient was seen and examined by Dr Orma Flaming who agrees with the assessment and plan as above.

## 2019-11-18 NOTE — Unmapped (Signed)
Please call [employer] to inquire about the light unit. Contact at:   [phone]   or    [phone]    We will check labs    We will increase acitretin dose once the labs come back as normal    Continue clobetasol ointment

## 2019-11-25 LAB — AST (SGOT): Aspartate aminotransferase:CCnc:Pt:Ser/Plas:Qn:: 17

## 2019-11-25 LAB — CBC
HEMATOCRIT: 40.3 % (ref 34.0–46.6)
MEAN CORPUSCULAR HEMOGLOBIN CONC: 33.7 g/dL (ref 31.5–35.7)
MEAN CORPUSCULAR HEMOGLOBIN: 32.5 pg (ref 26.6–33.0)
MEAN CORPUSCULAR VOLUME: 96 fL (ref 79–97)
PLATELET COUNT: 259 10*3/uL (ref 150–450)
RED BLOOD CELL COUNT: 4.18 x10E6/uL (ref 3.77–5.28)
RED CELL DISTRIBUTION WIDTH: 14.2 % (ref 11.7–15.4)

## 2019-11-25 LAB — ALT (SGPT): Alanine aminotransferase:CCnc:Pt:Ser/Plas:Qn:: 12

## 2019-11-25 LAB — RED BLOOD CELL COUNT: Erythrocytes:NCnc:Pt:Bld:Qn:Automated count: 4.18

## 2019-11-25 LAB — TRIGLYCERIDES: Triglyceride:MCnc:Pt:Ser/Plas:Qn:: 96

## 2019-11-25 LAB — CHOLESTEROL, TOTAL: Cholesterol:MCnc:Pt:Ser/Plas:Qn:: 197

## 2019-11-25 NOTE — Unmapped (Signed)
RN spoke with patient concerning appointment on 11/28/19. Patient would like to keep the appointment d/t having pain in the shoulder. Patient was given appointment time.

## 2019-11-28 ENCOUNTER — Encounter: Admit: 2019-11-28 | Discharge: 2019-11-29 | Payer: MEDICARE

## 2019-11-28 ENCOUNTER — Encounter
Admit: 2019-11-28 | Discharge: 2019-11-29 | Payer: MEDICARE | Attending: Orthopaedic Surgery | Primary: Orthopaedic Surgery

## 2019-11-28 DIAGNOSIS — M5412 Radiculopathy, cervical region: Principal | ICD-10-CM

## 2019-11-28 DIAGNOSIS — L409 Psoriasis, unspecified: Principal | ICD-10-CM

## 2019-11-28 MED ORDER — ACITRETIN 25 MG CAPSULE
ORAL_CAPSULE | Freq: Every day | ORAL | 1 refills | 30.00000 days | Status: CP
Start: 2019-11-28 — End: ?

## 2019-11-28 MED ORDER — METHOCARBAMOL 500 MG TABLET
ORAL_TABLET | Freq: Three times a day (TID) | ORAL | 0 refills | 30 days | Status: CP | PRN
Start: 2019-11-28 — End: 2019-12-28

## 2019-11-28 NOTE — Unmapped (Signed)
Communicated normal lab results to patient over MyChart. OK to increase acitretin to 25 mg daily.

## 2019-11-28 NOTE — Unmapped (Signed)
Your diagnosis: Cervical fusion    Recommendations/Plan  ?? Medications: We have prescribed Robaxin again. Our Nurse will help figure out regarding the insurance approval issues.  ?? Activity: Activities as tolerated.  ?? Imaging studies: MRI of the Cervical spine was ordered. Medical necessity: Radiographs have been performed/ordered. We suspect post-op nerve compression.  ?? Cervical AP and lateral views (today on the way out).  ?? Follow-up: After imaging studies.    Contact our team electronically via MyChart message to [facility] Clinical Staff.    Contact our team at [phone] or [phone] with any questions/concerns.
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
## 2019-11-29 NOTE — Unmapped (Signed)
I, Nemiah Commander, MD, saw and evaluated the patient, participating in the key elements of the service.  I discussed the findings, assessment and plan with the resident and agree with resident???s findings and plan as documented in the resident's note.

## 2019-11-29 NOTE — Unmapped (Signed)
Spoke with Marzetta Board, representative patient was approved for Methocarbomol 500 mg, 90 tablets until 08/2020.

## 2019-12-01 NOTE — Unmapped (Signed)
[facility]  [address] [place], [place] [address]    [phone]    [name] did not show for her  scheduled Speech Therapy Evaluation.  Please contact me if you have any questions or concerns.     Thank you for this referral,     Signed: Ilda Mori, SLP  12/01/2019 2:16 PM

## 2019-12-03 NOTE — Unmapped (Signed)
-----   Message from Nemiah Commander, MD sent at 11/29/2019  9:30 AM EDT -----  Regarding: XR results  Good morning,    Pls call pt to let her know that I looked at XR and implants look great.  Plan for return visit after MRI.    Thanks  ML

## 2019-12-03 NOTE — Unmapped (Signed)
Voicemail has not been set up. Unable to leave a message.     Nemiah Commander, MD  [facility] Clinical Staff      ??      Good morning,     Pls call pt to let her know that I looked at XR and implants look great.   Plan for return visit after MRI.     Thanks   ML

## 2019-12-05 NOTE — Unmapped (Signed)
[facility] Clinical Assessment & Refill Coordination Note    Michele Miller, DOB: 1961/08/29  Phone: [phone] (home)     All above HIPAA information was verified with patient.     Was a Nurse, learning disability used for this call? No    Specialty Medication(s):   Inflammatory Disorders: methotrexate (injectable)     Current Outpatient Medications   Medication Sig Dispense Refill   ??? acitretin (SORIATANE) 25 MG capsule Take 1 capsule (25 mg total) by mouth daily. 30 capsule 1   ??? albuterol HFA 90 mcg/actuation inhaler Inhale 2 puffs by mouth every six (6) hours as needed for wheezing. 54 g 6   ??? ARIPiprazole (ABILIFY) 5 MG tablet      ??? cetirizine (ZYRTEC) 10 MG tablet Take 1 tablet (10 mg total) by mouth daily. 90 tablet 3   ??? clobetasoL (TEMOVATE) 0.05 % ointment Apply topically Two (2) times a day. 120 g 1   ??? clonazePAM (KLONOPIN) 0.5 MG tablet Take 0.5 mg by mouth Three (3) times a day.     ??? clonazePAM (KLONOPIN) 1 MG tablet Take 1 mg by mouth Three (3) times a day.     ??? dexlansoprazole (DEXILANT) 30 mg capsule Take 60 mg by mouth.     ??? diclofenac sodium (VOLTAREN) 1 % gel Apply 2 grams topically to small joint and 4 grams to large joints as needed up to 4 times daily (Patient taking differently: Apply 4 g topically 4 (four) times a day as needed for arthritis or pain. as needed for joint pain) 300 g PRN   ??? dicyclomine (BENTYL) 10 mg capsule Take 2 capsules (20 mg total) by mouth 4 (four) times a day as needed. 240 capsule 1   ??? DULoxetine (CYMBALTA) 60 MG capsule Take 2 capsules (120 mg total) by mouth daily. 60 capsule 3   ??? empty container (BD SHARPS COLLECTOR) Misc Use as directed. 1 each 3   ??? fluticasone propionate (FLONASE) 50 mcg/actuation nasal spray Use 2 sprays in each nostril daily. 16 g 4   ??? folic acid (FOLVITE) 1 MG tablet Take 1 tablet (1 mg total) by mouth daily. 90 tablet 3   ??? gabapentin (NEURONTIN) 300 MG capsule Take 1 capsule (300 mg total) by mouth Three (3) times a day. 270 capsule 0   ??? hydrocortisone 2.5 % ointment Apply 1 application topically Two (2) times a day. Apply in a thin layer to rash. Avoid getting in your eye. 60 g 1   ??? ketotifen (ZADITOR) 0.025 % (0.035 %) ophthalmic solution Administer 1 drop to both eyes Two (2) times a day. 5 mL 0   ??? loratadine (CLARITIN) 10 mg tablet      ??? meclizine (ANTIVERT) 12.5 mg tablet Take 12.5 mg by mouth Three (3) times a day as needed.      ??? methocarbamoL (ROBAXIN) 500 MG tablet Take 1 tablet (500 mg total) by mouth Three (3) times a day. 60 tablet 0   ??? methocarbamoL (ROBAXIN) 500 MG tablet Take 1 tablet (500 mg total) by mouth Three (3) times a day as needed. 90 tablet 0   ??? methotrexate 25 mg/mL injection solution Inject 1 mL (25 mg total) under the skin once a week. 4 mL 5   ??? multivitamin with minerals tablet Take 1 tablet by mouth daily.     ??? naloxone (NARCAN) 4 mg/actuation nasal spray One spray in either nostril once for known/suspected opioid overdose. May repeat  every 2-3 minutes in alternating nostril til EMS arrives 2 each 0   ??? polyethylene glycol (MIRALAX) 17 gram packet Mix the contents of 1 packet (17 g) in 4-8 ounces of water, juice, soda, coffee, or tea and take by mouth daily for 5 days, then use as needed (Patient taking differently: Take 17 g by mouth daily as needed. ) 30 packet 3   ??? senna (SENOKOT) 8.6 mg tablet Take 1 tablet by mouth daily as needed for constipation. take 1 pill as needed for cosntipation may increase to 2 tabs if no relief     ??? syringe with needle 1 mL 27 x 1/2 Syrg Use as directed for weekly Methotrexate injections 50 each 0   ??? tiotropium bromide (SPIRIVA RESPIMAT) 1.25 mcg/actuation Mist Inhale 2 puffs (2.5 mcg) BY MOUTH daily. 4 g 5   ??? tiotropium-olodateroL (STIOLTO RESPIMAT) 2.5-2.5 mcg/actuation Mist Inhale 2 puffs daily.     ??? traZODone (DESYREL) 50 MG tablet Take 50 mg by mouth nightly. takes 2 tabs      ??? [START ON 12/22/2019] varicella-zoster [id number]-AS01B, PF, (SHINGRIX, PF,) 50 mcg/0.5 mL SusR injection Inject 0.5 mL into the muscle.       No current facility-administered medications for this visit.         Changes to medications: Michele Miller reports no changes at this time.    No Known Allergies    Changes to allergies: No    SPECIALTY MEDICATION ADHERENCE     methotrexate 25mg /ml: 0 days of medicine on hand     Medication Adherence    Patient reported X missed doses in the last month: 0  Specialty Medication: methotrexate 25mg /ml          Specialty medication(s) dose(s) confirmed: Regimen is correct and unchanged.     Are there any concerns with adherence? No    Adherence counseling provided? Not needed    CLINICAL MANAGEMENT AND INTERVENTION      Clinical Benefit Assessment:    Do you feel the medicine is effective or helping your condition? Yes    Clinical Benefit counseling provided? Not needed    Adverse Effects Assessment:    Are you experiencing any side effects? No    Are you experiencing difficulty administering your medicine? No    Quality of Life Assessment:    Rheumatology:   Quality of Life    On a scale of 1 ??? 10 with 1 representing not at all and 10 representing completely ??? how has your rheumatologic condition affected your:  Daily pain level?: decline to answer  Ability to complete your regular daily tasks (prepare meals, get dressed, etc.)?: decline to answer  Ability to participate in social or family activities?: decline to answer         Have you discussed this with your provider? Not needed    Therapy Appropriateness:    Is therapy appropriate? Yes, therapy is appropriate and should be continued    DISEASE/MEDICATION-SPECIFIC INFORMATION      For patients on injectable medications: Patient currently has 0 doses left.  Next injection is scheduled for 12/07/2019.    PATIENT SPECIFIC NEEDS     ? Does the patient have any physical, cognitive, or cultural barriers? No    ? Is the patient high risk? No     ? Does the patient require a Care Management Plan? No     ? Does the patient require physician intervention or other additional services (i.e. nutrition, smoking cessation, social work)?  No      SHIPPING     Specialty Medication(s) to be Shipped:   Inflammatory Disorders: methotrexate 25mg /ml    Other medication(s) to be shipped: syringes, folic acid       Changes to insurance: No    Delivery Scheduled: Yes, Expected medication delivery date: 12/07/2019.     Medication will be delivered via Next Day Courier to the confirmed prescription address in [facility].    The patient will receive a drug information handout for each medication shipped and additional FDA Medication Guides as required.  Verified that patient has previously received a Conservation officer, historic buildings.    All of the patient's questions and concerns have been addressed.    Karene Fry Dushawn Pusey   [facility] Specialty Pharmacist

## 2019-12-06 MED FILL — FOLIC ACID 1 MG TABLET: ORAL | 90 days supply | Qty: 90 | Fill #1

## 2019-12-06 MED FILL — BD TUBERCULIN SYRINGE 1 ML 27 X 1/2": SUBCUTANEOUS | 28 days supply | Qty: 4 | Fill #4

## 2019-12-06 MED FILL — METHOTREXATE SODIUM 25 MG/ML INJECTION SOLUTION: 28 days supply | Qty: 4 | Fill #4 | Status: AC

## 2019-12-06 MED FILL — FOLIC ACID 1 MG TABLET: 90 days supply | Qty: 90 | Fill #1 | Status: AC

## 2019-12-06 MED FILL — BD TUBERCULIN SYRINGE 1 ML 27 X 1/2": 28 days supply | Qty: 4 | Fill #4 | Status: AC

## 2019-12-06 MED FILL — METHOTREXATE SODIUM 25 MG/ML INJECTION SOLUTION: SUBCUTANEOUS | 28 days supply | Qty: 4 | Fill #4

## 2019-12-16 ENCOUNTER — Encounter: Admit: 2019-12-16 | Discharge: 2019-12-17 | Payer: MEDICARE

## 2019-12-16 ENCOUNTER — Encounter
Admit: 2019-12-16 | Discharge: 2019-12-17 | Payer: MEDICARE | Attending: Orthopaedic Surgery | Primary: Orthopaedic Surgery

## 2019-12-16 DIAGNOSIS — M5412 Radiculopathy, cervical region: Principal | ICD-10-CM
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
## 2019-12-16 NOTE — Unmapped (Addendum)
Your diagnosis: Cervical radiculitis    Recommendations/Plan  ?? Medications: Take medications as precribed by your Medical Team  ?? Activity: Activities as tolerated.  ?? Conservative Care: Let's continue to monitor your symptoms.   ?? We referred you to Pain Management.  I typically do not prescribe chronic narcotics nor tramadol.  ?? Surgical Care: Let's continue to reserve surgical care as a last resort.  Surgery is not healed yet.  MRI showed no problems but there are some narrowed nerve tunnels that we could address in future possibly.  ?? Imaging studies: Cervical AP and lateral views (prior to next visit).  ?? Follow-up: To be scheduled in about 3 month(s).    Contact our team electronically via MyChart message to [facility] Clinical Staff.    Contact our team at [phone] or [phone] with any questions/concerns.

## 2019-12-25 NOTE — Unmapped (Signed)
Tried x3 no voicemail

## 2019-12-26 NOTE — Unmapped (Deleted)
Dermatology Note     Assessment and Plan:      Hand dermatitis in pt with hx psoriatic arthritis, flaring on maximum dose methotrexate  - on MTX 25mg  weekly for PsA per rheum; no hx cutaneous psoriasis  - DDx of psoriasis favored over ACD of hands   - patient has responded to short courses of pred in past  - she was unable to tolerate Otezla due to GI side effects and headaches   - has not received home nbUVB unit. Per [facility], they have been unable to contact patient after multiple attempts. Advised patient to call [facility] and their contact number was provided.  - she reports subjective improvement on acitretin 10 mg. If acitretin labs wnl, increase to acitretin 25 mg daily.   - continue clobetasol BID with occlusion under cotton gloves at night. Discussed with patient continued avoidance of irritants by using gloves for housework and wet work  ??  High risk medication use- acitretin   - AST  - ALT  - Cholesterol, Total  - Triglycerides    There are no diagnoses linked to this encounter.    The patient was advised to call for an appointment should any new, changing, or symptomatic lesions develop.     RTC: No follow-ups on file. or sooner as needed   _________________________________________________________________      Chief Complaint     No chief complaint on file.      HPI     Michele Miller is a 59 y.o. female who presents as a returning patient (last seen 11/18/2019) to [facility] for follow up of hand dermatitis.  At the last visit, we increased the dose of acitretin from 10mg  daily to 25mg  daily in addition to continuing clobetasol ointment twice daily.     The patient denies any other new or changing lesions or areas of concern.     Pertinent Past Medical History     No history of skin cancer    Past Medical History:   Diagnosis Date   ??? Aortic atherosclerosis (CMS-HCC)    ??? Baker's cyst    ??? COPD (chronic obstructive pulmonary disease) (CMS-HCC)    ??? CTS (carpal tunnel syndrome)    ??? Depression    ??? Headache(784.0)    ??? HLD (hyperlipidemia)    ??? Joint pain    ??? Myelomalacia of cervical cord (CMS-HCC) 09/22/2019   ??? Psoriasis    ??? Psoriatic arthritis (CMS-HCC)    ??? PTSD (post-traumatic stress disorder)    ??? Scoliosis    ??? Tobacco use         Family History:   Negative for melanoma    Past Medical History, Family History, Social History, Medication List, Allergies, and Problem List were reviewed in the rooming section of Epic.     ROS: Other than symptoms mentioned in the HPI, no fevers, chills, or other skin complaints    Physical Examination     GENERAL: Well-appearing Fitzpatrick skin type {Fitzpatrick skin type numbers:75512} female in no acute distress, resting comfortably.  {PE systems:75418::NEURO: Alert and oriented, answers questions appropriately}  {PE extent:75514}  {PE list:75421}  ***    {PE limitations:75419::All areas not commented on are within normal limits or unremarkable}

## 2020-01-03 DIAGNOSIS — J3089 Other allergic rhinitis: Principal | ICD-10-CM

## 2020-01-03 MED ORDER — VYVANSE 30 MG CAPSULE
0 days
Start: 2020-01-03 — End: ?

## 2020-01-03 MED ORDER — FLUTICASONE PROPIONATE 50 MCG/ACTUATION NASAL SPRAY,SUSPENSION
Freq: Every day | NASAL | 4 refills | 30.00000 days | Status: CP
Start: 2020-01-03 — End: 2021-01-02
  Filled 2020-01-06: qty 16, 30d supply, fill #0

## 2020-01-03 NOTE — Unmapped (Signed)
[facility] Refill Coordination Note    Specialty Medication(s) to be Shipped:   Inflammatory Disorders: methotrexate (injectable)    Other medication(s) to be shipped: fluticasone, syringes     [name], DOB: [birth year]  Phone: [phone] (home)       All above HIPAA information was verified with patient.     Was a Nurse, learning disability used for this call? No    Completed refill call assessment today to schedule patient's medication shipment from the [facility] [phone]).       Specialty medication(s) and dose(s) confirmed: Regimen is correct and unchanged.   Changes to medications: [name] reports no changes at this time.  Changes to insurance: No  Questions for the pharmacist: No    Confirmed patient received Welcome Packet with first shipment. The patient will receive a drug information handout for each medication shipped and additional FDA Medication Guides as required.       DISEASE/MEDICATION-SPECIFIC INFORMATION        For patients on injectable medications: Patient currently has 1 doses left.  Next injection is scheduled for 4/23.    SPECIALTY MEDICATION ADHERENCE     Medication Adherence    Patient reported X missed doses in the last month: 0  Specialty Medication: methotrexate  Patient is on additional specialty medications: No  Patient is on more than two specialty medications: No  Any gaps in refill history greater than 2 weeks in the last 3 months: no  Demonstrates understanding of importance of adherence: yes  Informant: patient                Methotrexate 25mg /ml: Patient has 7 days of medication on hand       SHIPPING     Shipping address confirmed in Epic.     Delivery Scheduled: Yes, Expected medication delivery date: 4/23.     Medication will be delivered via Same Day Courier to the prescription address in Epic WAM.    [name]   [facility] Specialty Technician

## 2020-01-05 ENCOUNTER — Encounter: Admit: 2020-01-05 | Discharge: 2020-01-06 | Payer: MEDICARE | Attending: Rheumatology | Primary: Rheumatology

## 2020-01-05 NOTE — Unmapped (Signed)
RHEUMATOLOGY RETURN VISIT    REASON FOR VISIT: f/u PsA     Identification: Pt self identified using name and date of birth  Patient location: [place]  The limitations of this telemedicine encounter were discussed with patient. Both the patient and myself agreed to this encounter despite these limitations. Benefits of this telemedicine encounter included allowing for continued care of patient and minimizing risk of exposure to COVID-19.     HISTORY: [name] is a 59 y.o. female with hx of psoriatic arthritis, chronic DDD, and centralized chronic pain. Was following pain management but has lost f/u due to transportation issues, recently referred but not yet seen again.  Psoriatic arthritis treated with mtx SQ monotherapy. Also on pharmacotherapy for fibromyalgia from PCP. Recently seeing dermatology more frequently due to severe involvement of her hands.  Intolerant of Otezla for this. She has multiple psychiatric comorbidities including PTSD and anxiety. These have been exacerbated by her son's untimely death of her son in 37.     Interim history:   Pt presents via phone call for follow up as she is unable to connect via video.  She continues to have peeling skin on her hands, did not tolerate otezla, now on acitretin but sx continue.  She is asking about other topicals for this.  She was supposed to get a home light box for her hands but has had difficulty setting this up.  For her neck pain, she underwent multilevel ACDF in Jan 2021, continues to have shoulder and arm pain following the surgery. Only held MTX 2 weeks around surgery. She feels depressed because of her pain and skin disease.  Her mother was recently diagnosed with breast cancer, and patient endorses a lot of stress with this.  She has not yet seen pain management due to scheduling issues and trying to help her mother.   Has f/u with Derm 02/06/20, Spine 7/21.     CURRENT MEDICATIONS:  Current Outpatient Medications   Medication Sig Dispense Refill ??? acitretin (SORIATANE) 25 MG capsule Take 1 capsule (25 mg total) by mouth daily. 30 capsule 1   ??? albuterol HFA 90 mcg/actuation inhaler Inhale 2 puffs by mouth every six (6) hours as needed for wheezing. 54 g 6   ??? ARIPiprazole (ABILIFY) 5 MG tablet      ??? cetirizine (ZYRTEC) 10 MG tablet Take 1 tablet (10 mg total) by mouth daily. 90 tablet 3   ??? clobetasoL (TEMOVATE) 0.05 % ointment Apply topically Two (2) times a day. 120 g 1   ??? clonazePAM (KLONOPIN) 0.5 MG tablet Take 0.5 mg by mouth Three (3) times a day.     ??? clonazePAM (KLONOPIN) 1 MG tablet Take 1 mg by mouth Three (3) times a day.     ??? dexlansoprazole (DEXILANT) 30 mg capsule Take 60 mg by mouth.     ??? diclofenac sodium (VOLTAREN) 1 % gel Apply 2 grams topically to small joint and 4 grams to large joints as needed up to 4 times daily (Patient taking differently: Apply 4 g topically 4 (four) times a day as needed for arthritis or pain. as needed for joint pain) 300 g PRN   ??? dicyclomine (BENTYL) 10 mg capsule Take 2 capsules (20 mg total) by mouth 4 (four) times a day as needed. 240 capsule 1   ??? DULoxetine (CYMBALTA) 60 MG capsule Take 2 capsules (120 mg total) by mouth daily. 60 capsule 3   ??? empty container (BD SHARPS COLLECTOR) Misc Use as  directed. 1 each 3   ??? fluticasone propionate (FLONASE) 50 mcg/actuation nasal spray Use 2 sprays in each nostril daily. 16 g 4   ??? folic acid (FOLVITE) 1 MG tablet Take 1 tablet (1 mg total) by mouth daily. 90 tablet 3   ??? gabapentin (NEURONTIN) 300 MG capsule Take 1 capsule (300 mg total) by mouth Three (3) times a day. 270 capsule 0   ??? hydrocortisone 2.5 % ointment Apply 1 application topically Two (2) times a day. Apply in a thin layer to rash. Avoid getting in your eye. 60 g 1   ??? ketotifen (ZADITOR) 0.025 % (0.035 %) ophthalmic solution Administer 1 drop to both eyes Two (2) times a day. (Patient not taking: Reported on 12/16/2019) 5 mL 0   ??? loratadine (CLARITIN) 10 mg tablet      ??? meclizine (ANTIVERT) 12.5 mg tablet Take 12.5 mg by mouth Three (3) times a day as needed.      ??? methocarbamoL (ROBAXIN) 500 MG tablet Take 1 tablet (500 mg total) by mouth Three (3) times a day. 60 tablet 0   ??? methotrexate 25 mg/mL injection solution Inject 1 mL (25 mg total) under the skin once a week. 4 mL 5   ??? multivitamin with minerals tablet Take 1 tablet by mouth daily.     ??? naloxone (NARCAN) 4 mg/actuation nasal spray One spray in either nostril once for known/suspected opioid overdose. May repeat every 2-3 minutes in alternating nostril til EMS arrives 2 each 0   ??? polyethylene glycol (MIRALAX) 17 gram packet Mix the contents of 1 packet (17 g) in 4-8 ounces of water, juice, soda, coffee, or tea and take by mouth daily for 5 days, then use as needed (Patient taking differently: Take 17 g by mouth daily as needed. ) 30 packet 3   ??? senna (SENOKOT) 8.6 mg tablet Take 1 tablet by mouth daily as needed for constipation. take 1 pill as needed for cosntipation may increase to 2 tabs if no relief     ??? syringe with needle 1 mL 27 x 1/2 Syrg Use as directed for weekly Methotrexate injections 50 each 0   ??? tiotropium bromide (SPIRIVA RESPIMAT) 1.25 mcg/actuation Mist Inhale 2 puffs (2.5 mcg) BY MOUTH daily. (Patient not taking: Reported on 12/16/2019) 4 g 5   ??? tiotropium-olodateroL (STIOLTO RESPIMAT) 2.5-2.5 mcg/actuation Mist Inhale 2 puffs daily.     ??? traZODone (DESYREL) 50 MG tablet Take 50 mg by mouth nightly. takes 2 tabs        No current facility-administered medications for this visit.       Record Review: Available records were reviewed, including pertinent office visits, labs, and imaging.   Lab Results   Component Value Date    WBC 6.5 11/24/2019    RBC 4.18 11/24/2019    HGB 13.6 11/24/2019    HCT 40.3 11/24/2019    MCV 96 11/24/2019    MCH 32.5 11/24/2019    MCHC 33.7 11/24/2019    RDW 14.2 11/24/2019    PLT 259 11/24/2019    MPV 8.1 09/24/2019      Lab Results   Component Value Date    CREATININE 0.68 09/24/2019 Lab Results   Component Value Date    ALKPHOS 88 09/10/2019    BILITOT 0.5 09/10/2019    BILIDIR <0.10 01/18/2016    PROT 6.2 (L) 09/10/2019    ALBUMIN 3.7 09/10/2019    ALT 12 11/24/2019    AST 17 11/24/2019  PHYSICAL EXAM:  General:   Does not sound to be in distress   Lungs:  No wheezing, coughing, or increased respiratory effort noted   Psych:  Appropriate interaction       ASSESSMENT/PLAN:  1. Psoriatic arthritis (CMS-HCC) and recently diagnosed cutaneous psoriasis of hands  Arthralgias stable, still suspect that her arthralgias are more related to OA than active psoriatic arthritis. Denies any acutely inflamed joints.  -Continue f/u with Derm for cutaneous psoriasis, did not tolerate otezla, on soriatane, will send my note as she is not doing well.  She is unable to access mychart and it appears that they are messaging through there.  -Continue mtx 25 mg sq qwk.     2. DJD spine  Noted in both [facility] and [facility]. C-spine surgery Jan 2021. Following with Dr. Jaynie Collins.     3. Fibromyalgia  Symptomatic but stable. Continue pharmacotherapy per PCP.     4. Methotrexate, long term, current use  Labs one month ago were wnl.     HCM:   - PCV13 Status: Did not discuss today  - PPSV 23 Status: 11/03/2013  - Annual Influenza vaccine. Status: did not discuss today   - Bone health: not on prednisone   - Contraception: Status post hysterectomy    I spent 15 minutes on the phone visit with the patient on the date of service. I spent an additional 2 minutes on pre- and post-visit activities on the date of service.     The patient was not located and I was located within 250 yards of a hospital based location during the phone visit. The patient was physically located in [place] or a state in which I am permitted to provide care. The patient and/or parent/guardian understood that s/he may incur co-pays and cost sharing, and agreed to the telemedicine visit. The visit was reasonable and appropriate under the circumstances given the patient's presentation at the time.    The patient and/or parent/guardian has been advised of the potential risks and limitations of this mode of treatment (including, but not limited to, the absence of in-person examination) and has agreed to be treated using telemedicine. The patient's/patient's family's questions regarding telemedicine have been answered.    If the visit was completed in an ambulatory setting, the patient and/or parent/guardian has also been advised to contact their provider???s office for worsening conditions, and seek emergency medical treatment and/or call 911 if the patient deems either necessary.     Lynann Beaver MD [facility]  [facility]

## 2020-01-06 MED FILL — METHOTREXATE SODIUM 25 MG/ML INJECTION SOLUTION: SUBCUTANEOUS | 28 days supply | Qty: 4 | Fill #5

## 2020-01-06 MED FILL — BD TUBERCULIN SYRINGE 1 ML 27 X 1/2": 28 days supply | Qty: 4 | Fill #5 | Status: AC

## 2020-01-06 MED FILL — FLUTICASONE PROPIONATE 50 MCG/ACTUATION NASAL SPRAY,SUSPENSION: 30 days supply | Qty: 16 | Fill #0 | Status: AC

## 2020-01-06 MED FILL — BD TUBERCULIN SYRINGE 1 ML 27 X 1/2": SUBCUTANEOUS | 28 days supply | Qty: 4 | Fill #5

## 2020-01-06 MED FILL — METHOTREXATE SODIUM 25 MG/ML INJECTION SOLUTION: 28 days supply | Qty: 4 | Fill #5 | Status: AC

## 2020-01-10 NOTE — Unmapped (Signed)
I called speaking to Ms Roxan Hockey to confirm she has not heard from [employer] for the home light therapy unit.  I then called [employer].  I spoke with customer service.  The rep said Joni Reining was trying to reach Ms Roxan Hockey, but she hasn't answered and does not have voicemail set up on her phone.      When I tried calling Ms Roxan Hockey back, she did not answer and there was no voice mail set up.  I continued calling and Ms Roxan Hockey did answer.  I explained Joni Reining with [employer] has been trying to reach her to set up the home light therapy system account. I let Ms Roxan Hockey know her voicemail was not set up so the are not even able to leave a message.  Ms Roxan Hockey is aware and does not want her voicemail.  Ms Roxan Hockey also let me know she will not answer calls that alert her as possible spam.  I empathized and explained at times calls from [facility] through [employer] will show as spam. Ms Roxan Hockey said she would try to calling [employer] directly.

## 2020-01-13 ENCOUNTER — Encounter
Admit: 2020-01-13 | Discharge: 2020-01-14 | Payer: MEDICARE | Attending: Orthopaedic Surgery | Primary: Orthopaedic Surgery
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
## 2020-01-16 NOTE — Unmapped (Signed)
I have tried to call Ms Michele Miller twice and will continue.  I am trying to find out the status of her home light therapy with [employer].

## 2020-01-16 NOTE — Unmapped (Signed)
I spoke with the customer service rep.  I asked to have Joni Reining currently working on Ms Roxan Hockey case call me and I would assess triangulate getting all parties on the phone to help this process.  The customer service rep did confirm they received the updated insurance information to also help process this case.

## 2020-01-16 NOTE — Unmapped (Signed)
I spoke with Ms Michele Miller.  She stated she has been calling [employer] directly, but only getting [employer].  Ms Michele Miller said she has been answering her possible spam calls too, but has not heard from [employer].

## 2020-01-16 NOTE — Unmapped (Signed)
Ms Michele Miller still not answering the phone and no ability to leave message to check status of home light therapy.

## 2020-01-27 DIAGNOSIS — L4050 Arthropathic psoriasis, unspecified: Principal | ICD-10-CM

## 2020-01-27 MED ORDER — METHOTREXATE SODIUM 25 MG/ML INJECTION SOLUTION
SUBCUTANEOUS | 5 refills | 28 days | Status: CP
Start: 2020-01-27 — End: ?

## 2020-01-27 NOTE — Unmapped (Signed)
[facility] Refill Coordination Note    Specialty Medication(s) to be Shipped:   Inflammatory Disorders: methotrexate (injectable)    Other medication(s) to be shipped: fluticasone     [name], DOB: 03-07-1961  Phone: [phone] (home)       All above HIPAA information was verified with patient.     Was a Nurse, learning disability used for this call? No    Completed refill call assessment today to schedule patient's medication shipment from the [facility] [phone]).       Specialty medication(s) and dose(s) confirmed: Regimen is correct and unchanged.   Changes to medications: [name] reports no changes at this time.  Changes to insurance: No  Questions for the pharmacist: No    Confirmed patient received Welcome Packet with first shipment. The patient will receive a drug information handout for each medication shipped and additional FDA Medication Guides as required.       DISEASE/MEDICATION-SPECIFIC INFORMATION        For patients on injectable medications: Patient currently has 0 doses left.  Next injection is scheduled for 5/21.    SPECIALTY MEDICATION ADHERENCE     Medication Adherence    Patient reported X missed doses in the last month: 0  Specialty Medication: methotrexate  Patient is on additional specialty medications: No  Patient is on more than two specialty medications: No  Any gaps in refill history greater than 2 weeks in the last 3 months: no  Demonstrates understanding of importance of adherence: yes  Informant: patient                Methotrexate 25mg /ml: Patient has 0 days of medication on hand      SHIPPING     Shipping address confirmed in Epic.     Delivery Scheduled: Yes, Expected medication delivery date: 5/19.  However, Rx request for refills was sent to the provider as there are none remaining.     Medication will be delivered via Same Day Courier to the prescription address in Epic WAM.    [name]   [facility] Specialty Technician

## 2020-01-27 NOTE — Unmapped (Signed)
Methotrexate refill  Last ov: 01/05/2020  Next ov:10.28.2021    Labs:   AST   Date Value Ref Range Status   11/24/2019 17 0 - 40 IU/L Final     ALT   Date Value Ref Range Status   11/24/2019 12 0 - 32 IU/L Final     Creatinine   Date Value Ref Range Status   09/24/2019 0.68 0.60 - 1.00 mg/dL Final   46/96/2952 8.41 0.60 - 1.00 mg/dL Final     WBC   Date Value Ref Range Status   11/24/2019 6.5 3.4 - 10.8 x10E3/uL Final     HGB   Date Value Ref Range Status   11/24/2019 13.6 11.1 - 15.9 g/dL Final     HCT   Date Value Ref Range Status   11/24/2019 40.3 34.0 - 46.6 % Final     MCV   Date Value Ref Range Status   11/24/2019 96 79.0 - 97.0 fL Final     RDW   Date Value Ref Range Status   11/24/2019 14.2 11.7 - 15.4 % Final     Platelet   Date Value Ref Range Status   11/24/2019 259 150 - 450 x10E3/uL Final     Neutrophils %   Date Value Ref Range Status   09/10/2019 61.9 % Final     Lymphocytes %   Date Value Ref Range Status   09/10/2019 23.6 % Final     Monocytes %   Date Value Ref Range Status   09/10/2019 7.6 % Final     Eosinophils %   Date Value Ref Range Status   09/10/2019 5.9 % Final     Basophils %   Date Value Ref Range Status   09/10/2019 1.0 % Final

## 2020-02-01 MED FILL — METHOTREXATE SODIUM 25 MG/ML INJECTION SOLUTION: SUBCUTANEOUS | 28 days supply | Qty: 4 | Fill #0

## 2020-02-01 MED FILL — METHOTREXATE SODIUM 25 MG/ML INJECTION SOLUTION: 28 days supply | Qty: 4 | Fill #0 | Status: AC

## 2020-02-01 MED FILL — FLUTICASONE PROPIONATE 50 MCG/ACTUATION NASAL SPRAY,SUSPENSION: NASAL | 30 days supply | Qty: 16 | Fill #1

## 2020-02-01 MED FILL — FLUTICASONE PROPIONATE 50 MCG/ACTUATION NASAL SPRAY,SUSPENSION: 30 days supply | Qty: 16 | Fill #1 | Status: AC

## 2020-02-06 ENCOUNTER — Encounter: Admit: 2020-02-06 | Discharge: 2020-02-07 | Payer: MEDICARE | Attending: Dermatology | Primary: Dermatology

## 2020-02-06 DIAGNOSIS — L309 Dermatitis, unspecified: Principal | ICD-10-CM

## 2020-02-06 DIAGNOSIS — Z79899 Other long term (current) drug therapy: Principal | ICD-10-CM

## 2020-02-06 DIAGNOSIS — L409 Psoriasis, unspecified: Principal | ICD-10-CM

## 2020-02-06 MED ORDER — TAZAROTENE 0.1 % TOPICAL CREAM
5 refills | 0 days | Status: CP
Start: 2020-02-06 — End: ?

## 2020-02-06 MED ORDER — CLOBETASOL 0.05 % TOPICAL OINTMENT
Freq: Two times a day (BID) | TOPICAL | 1 refills | 0 days | Status: CP
Start: 2020-02-06 — End: 2021-02-05

## 2020-02-06 MED ORDER — ACITRETIN 25 MG CAPSULE
ORAL_CAPSULE | Freq: Every day | ORAL | 2 refills | 30 days | Status: CP
Start: 2020-02-06 — End: ?

## 2020-02-06 NOTE — Unmapped (Unsigned)
Dermatology Note     Assessment and Plan:      Hand dermatitis in patient with history of psoriatic arthritis, flaring on maximum dose of methotrexate (25mg  weekly) and acitretin 25mg  daily  - rheumatology prescribing methotrexate at 25 mg weekly for psoriatic arthritis; no prior history of cutaneous psoriasis  - DDx includes psoriasis (favored) versus allergic contact dermatitis  ???Has responded to short courses of prednisone in the past  ???Previously did not tolerate Otezla due to GI side effects and headaches    Plan:  - continue acitretin 25mg  daily. Discussed that this is optimal dose to maximize therapeutic benefit minimize side effects, and we may still see some improvement over the coming months.  - acitretin (SORIATANE) 25 MG capsule; Take 1 capsule (25 mg total) by mouth daily.  Dispense: 30 capsule; Refill: 2  - Continue clobetasoL (TEMOVATE) 0.05 % ointment; Apply topically Two (2) times a day.  Dispense: 120 g; Refill: 1  - START tazarotene (AVAGE) 0.1 % cream; Mix with clobetasol and apply twice daily to rash on hands  Dispense: 60 g; Refill: 5  -We will discuss with her rheumatologist the possibility of starting Humira in addition to her other medications.  The point of this would be to attempt to clear her rash, and then work on tapering off one or more of the medications once clear  - provided contact information for [employer] so she can reach out regarding light box    High risk medication use: methotrexate and acitretin  - monitoring labs for acitretin sent to [facility] -- per pt request will include methotrexate monitoring labs to minimize blood draws  - will also check quant gold in anticipation of possibly starting Humira    The patient was advised to call for an appointment should any new, changing, or symptomatic lesions develop.     RTC: Return in about 2 months (around 04/07/2020) for hand dermatitis. or sooner as needed healing, skin keeps peeling off, still has not received light kit-pt is really upset         HPI     Michele Miller is a 59 y.o. female who presents as a returning patient (last seen 11/18/2019) to [facility] for follow up of hand dermatitis.     Today she reports that her hands are still not at goal.  She continues to have very painful fissuring particularly in the fingertips and sometimes along the palms makes it difficult to go about her activities of daily living.  She is using clobetasol regularly and feels that this helps.  She continues on her methotrexate acitretin as well with no significant side effects, but she is overall still not at her treatment goal.  She is interested in any other options.    The patient denies any other new or changing lesions or areas of concern.     History: initially suspected ACD but subsequently more c/w PsO which has persisted despite MTX 25 (for PsA) and high potency topical steroids. She has additionally had flares of rash on face, neck, seen in ED on 12/26, and treated w/ pred taper    Pertinent Past Medical History     No history of skin cancer    Patient Active Problem List   Diagnosis   ??? Depressive disorder   ??? Psoriasis   ??? Tobacco use disorder   ??? Carpal tunnel syndrome   ??? Anxiety   ??? Acute stress reaction   ??? Grief at loss of child   ???  Trigger thumb of right hand   ??? Benzodiazepine misuse   ??? COPD (chronic obstructive pulmonary disease) (CMS-HCC)   ??? Chronic pain   ??? Cervical spondylosis with myelopathy and radiculopathy   ??? GI bleed   ??? Epicondylitis, lateral   ??? Trochanteric bursitis of right hip   ??? Chronic idiopathic constipation   ??? PTSD (post-traumatic stress disorder)   ??? Myelomalacia of cervical cord (CMS-HCC)       Past Medical History, Family History, Social History, Medication List, Allergies, and Problem List were reviewed in the rooming section of Epic.     ROS: Other than symptoms mentioned in the HPI, no fevers, chills, or other skin complaints    Physical Examination     GENERAL: Well-appearing Fitzpatrick skin type II female in no acute distress, resting comfortably.  NEURO: Alert and oriented, answers questions appropriately  PSYCH: Normal mood and affect  EYES: lids clear, conjunctiva pink, no scleral icterus or other color change  {PE extent:75514} was performed  {PE list:75421}  ***    {PE limitations:75419::All areas not commented on are within normal limits or unremarkable}      (Approved Template 02/03/2020)

## 2020-02-06 NOTE — Unmapped (Signed)
Medication monitoring labs sent to [facility]

## 2020-02-06 NOTE — Unmapped (Addendum)
Click Here to Visit [facility] Covid-19 Vaccine Hub  Get the latest facts on the COVID-19 vaccines.    Or visit [facility]???s COVID-19 Vaccine Hub at [email or web address] to review the latest facts about the vaccines.      [facility] releases most laboratory, pathology (biopsy), and imaging results to you automatically as soon as they are available. Therefore, you may see results before we do. Please give [place] at least 1 week to review the results and contact you regarding what they mean. If you are concerned that some results may be upsetting or confusing, you may wish to wait until we contact you before looking at the report in MyChart. If you have not heard from [place] within 2 weeks, please contact our clinic.    Your resident doctor today was Dr. Harrison Mons. We are glad we had the chance to participate in your care!    Please call [employer] to inquire about the light unit. Contact at:   [phone]   or    [phone]    We will try sending in a new cream called Tazorac or tazarotene.  Mix this with the clobetasol in equal amounts and apply a thick layer to the hands twice a day.  You can wear cotton gloves over top if desired.    Continue the acitretin for now.

## 2020-02-08 LAB — CBC W/ DIFFERENTIAL
BANDED NEUTROPHILS ABSOLUTE COUNT: 0 10*3/uL (ref 0.0–0.1)
BASOPHILS ABSOLUTE COUNT: 0.1 10*3/uL (ref 0.0–0.2)
EOSINOPHILS ABSOLUTE COUNT: 0.1 10*3/uL (ref 0.0–0.4)
EOSINOPHILS RELATIVE PERCENT: 1 %
HEMATOCRIT: 38.6 % (ref 34.0–46.6)
HEMOGLOBIN: 12.9 g/dL (ref 11.1–15.9)
IMMATURE GRANULOCYTES: 0 %
LYMPHOCYTES ABSOLUTE COUNT: 2.5 10*3/uL (ref 0.7–3.1)
MEAN CORPUSCULAR HEMOGLOBIN CONC: 33.4 g/dL (ref 31.5–35.7)
MEAN CORPUSCULAR HEMOGLOBIN: 32.7 pg (ref 26.6–33.0)
MEAN CORPUSCULAR VOLUME: 98 fL — ABNORMAL HIGH (ref 79–97)
MONOCYTES ABSOLUTE COUNT: 0.4 10*3/uL (ref 0.1–0.9)
MONOCYTES RELATIVE PERCENT: 6 %
NEUTROPHILS ABSOLUTE COUNT: 4.6 10*3/uL (ref 1.4–7.0)
NEUTROPHILS RELATIVE PERCENT: 60 %
PLATELET COUNT: 268 10*3/uL (ref 150–450)
RED BLOOD CELL COUNT: 3.95 x10E6/uL (ref 3.77–5.28)
RED CELL DISTRIBUTION WIDTH: 13.1 % (ref 11.7–15.4)
WHITE BLOOD CELL COUNT: 7.6 10*3/uL (ref 3.4–10.8)

## 2020-02-08 LAB — RED BLOOD CELL COUNT: Erythrocytes:NCnc:Pt:Bld:Qn:Automated count: 3.95

## 2020-02-08 LAB — LIPID PANEL
LDL CHOLESTEROL CALCULATED: 136 mg/dL — ABNORMAL HIGH (ref 0–99)
LDL/HDL RATIO: 2.8 ratio (ref 0.0–3.2)
TRIGLYCERIDES: 153 mg/dL — ABNORMAL HIGH (ref 0–149)

## 2020-02-08 LAB — QUANTIFERON TB GOLD PLUS
QUANTIFERON TB GOLD: NEGATIVE
QUANTIFERON TB1 AG VALUE: 0 [IU]/mL
QUANTIFERON TB2 AG VALUE: 0 [IU]/mL

## 2020-02-08 LAB — AST (SGOT): Aspartate aminotransferase:CCnc:Pt:Ser/Plas:Qn:: 19

## 2020-02-08 LAB — CREATININE
CREATININE: 0.72 mg/dL (ref 0.57–1.00)
GFR MDRD AF AMER: 107 mL/min/{1.73_m2}

## 2020-02-08 LAB — LDL/HDL RATIO: Cholesterol.in LDL/Cholesterol.in HDL:MRto:Pt:Ser/Plas:Qn:: 2.8

## 2020-02-08 LAB — QUANTIFERON MITOGEN VALUE: Mitogen stimulated gamma interferon:ACnc:Pt:Bld:Qn:: >10

## 2020-02-08 LAB — BLOOD UREA NITROGEN: Urea nitrogen:MCnc:Pt:Ser/Plas:Qn:: 18

## 2020-02-08 LAB — ALT (SGPT): Alanine aminotransferase:CCnc:Pt:Ser/Plas:Qn:: 15

## 2020-02-08 LAB — GFR MDRD AF AMER: Glomerular filtration rate/1.73 sq M.predicted.black:ArVRat:Pt:Ser/Plas/Bld:Qn:Creatinine-based formula (CKD-EPI): 107

## 2020-02-10 DIAGNOSIS — L409 Psoriasis, unspecified: Principal | ICD-10-CM

## 2020-02-10 MED ORDER — ADALIMUMAB 80 MG/0.8 ML (X 1) AND 40 MG/0.4 ML (X 2) SUBCUTAN PEN KIT
0 refills | 0 days | Status: CP
Start: 2020-02-10 — End: ?
  Filled 2020-02-22: qty 3, 35d supply, fill #0

## 2020-02-10 MED ORDER — HUMIRA PEN CITRATE FREE 40 MG/0.4 ML
SUBCUTANEOUS | 3 refills | 84 days | Status: CP
Start: 2020-02-10 — End: 2021-02-09

## 2020-02-10 NOTE — Unmapped (Signed)
Notified patient of reassuring labs.     Discussed with patient and her rheumatologist, Dr Delton See, and everyone is comfortable moving ahead with humira. Will send rx and counseled pt that it may take some time to go through the insurance process, but she will be notified of decision when available. If approved, she can go ahead and start taking as soon as it arrives. Will continue methotrexate and acitretin as well.

## 2020-02-10 NOTE — Unmapped (Signed)
Addended by: Lattie Haw K on: 02/10/2020 01:54 PM     Modules accepted: Orders

## 2020-02-14 ENCOUNTER — Ambulatory Visit: Payer: Self-pay | Admitting: Nurse Practitioner

## 2020-02-14 NOTE — Telephone Encounter (Signed)
Pt stated she would be coming in due to not having other SX.Pt verbalized and confirmed understanding.

## 2020-02-14 NOTE — Telephone Encounter (Signed)
Noted  

## 2020-02-14 NOTE — Telephone Encounter (Signed)
Pt reports headache, "For years, worsening past 2-3 weeks." Pain at "Temples and top of head." States usually 9/10, ES tylenol ineffective. Also reports intermittent nausea, dizziness at times. Reports recently started on Adderall but had headaches prior, states BP"Good." Denies any dizziness presently, no visual changes. Had cervical fusion Jan 7th.  Virtual appt scheduled with PCP per covid screening: Friday 02/17/2020. Advised of process. Pt does not have MyChart. Advised ED if symptoms worsen; any unilateral weakness, vertigo,visual changes, N/V, speech, gait impairment. Pt verbalizes understanding.   Reason for Disposition  [1] MILD-MODERATE headache AND [2] present > 72 hours    States moderate-severe for  2-3 weeks  Answer Assessment - Initial Assessment Questions 1. LOCATION: "Where does it hurt?"      Temples and top of head 2. ONSET: "When did the headache start?" (Minutes, hours or days)      Have daily 3. PATTERN: "Does the pain come and go, or has it been constant since it started?"     constant 4. SEVERITY: "How bad is the pain?" and "What does it keep you from doing?"  (e.g., Scale 1-10; mild, moderate, or severe)   - MILD (1-3): doesn't interfere with normal activities    - MODERATE (4-7): interferes with normal activities or awakens from sleep    - SEVERE (8-10): excruciating pain, unable to do any normal activities        9/10 5. RECURRENT SYMPTOM: "Have you ever had headaches before?" If so, ask: "When was the last time?" and "What happened that time?"      Yes, for years, worsening past 2 weeks 6. CAUSE: "What do you think is causing the headache?"     unsure 7. MIGRAINE: "Have you been diagnosed with migraine headaches?" If so, ask: "Is this headache similar?"      no 8. HEAD INJURY: "Has there been any recent injury to the head?"     Had surgery Jan 7th, cervical fusion 9. OTHER SYMPTOMS: "Do you have any other symptoms?" (fever, stiff neck, eye pain, sore throat,  cold symptoms)     Nausea, dizziness at times  Protocols used: HEADACHE-A-AH

## 2020-02-14 NOTE — Telephone Encounter (Signed)
If no other symptoms, would prefer this in office on 02/17/20.  If worsening though I recommend she go to ER immediately or call provider who recently started her on Adderall to ensure they do not want to make changes.  Thank you.Marland Kitchen

## 2020-02-15 NOTE — Unmapped (Signed)
[facility] Specialty Medication Onboarding    Specialty Medication: HUMIRA (CF) PENS LOADING DOSE AND MAINTENANCE DOSE  Prior Authorization: Approved   Financial Assistance: No - copay  <$25  Final Copay/Day Supply: $0 / 35 DAYS FOR LOADING DOSE AND 28 DAYS FOR MAINTENANCE DOSE    Insurance Restrictions: None     Notes to Pharmacist:     The triage team has completed the benefits investigation and has determined that the patient is able to fill this medication at [facility]. Please contact the patient to complete the onboarding or follow up with the prescribing physician as needed.

## 2020-02-17 ENCOUNTER — Ambulatory Visit: Payer: Self-pay | Admitting: Nurse Practitioner

## 2020-02-17 DIAGNOSIS — L4050 Arthropathic psoriasis, unspecified: Principal | ICD-10-CM

## 2020-02-17 MED ORDER — BD TUBERCULIN SYRINGE 1 ML 25 GAUGE X 5/8"
PRN refills | 0 days
Start: 2020-02-17 — End: ?

## 2020-02-17 MED ORDER — SYRINGE WITH NEEDLE 1 ML 27 X 1/2"
PRN refills | 0 days
Start: 2020-02-17 — End: ?

## 2020-02-17 MED ORDER — GABAPENTIN 300 MG CAPSULE
ORAL_CAPSULE | Freq: Three times a day (TID) | ORAL | 0 refills | 90.00000 days
Start: 2020-02-17 — End: 2021-02-16

## 2020-02-17 NOTE — Unmapped (Signed)
[facility]   Patient Onboarding/Medication Counseling    [name] is a 59 y.o. female with psoriatic arthritis and psoriasis who I am counseling today on initiation of therapy.  I am speaking to the patient.    Was a Nurse, learning disability used for this call? No    Verified patient's date of birth / HIPAA.    Specialty medication(s) to be sent: Inflammatory Disorders: Humira      Non-specialty medications/supplies to be sent: none       Humira (adalimumab)    Medication & Administration     Dosage: Plaque psoriasis: Inject 80mg  under the skin on day 1, then 40mg  every 14 days starting on day 8    Lab tests required prior to treatment initiation:  ??? Tuberculosis: Tuberculosis screening resulted in a non-reactive Quantiferon TB Gold assay.  ??? Hepatitis B: Hepatitis B serology studies are complete and non-reactive.    Administration:     Prefilled auto-injector pen  1. Gather all supplies needed for injection on a clean, flat working surface: medication pen removed from packaging, alcohol swab, sharps container, etc.  2. Look at the medication label - look for correct medication, correct dose, and check the expiration date  3. Look at the medication - the liquid visible in the window on the side of the pen device should appear clear and colorless  4. Lay the auto-injector pen on a flat surface and allow it to warm up to room temperature for at least 30-45 minutes  5. Select injection site - you can use the front of your thigh or your belly (but not the area 2 inches around your belly button); if someone else is giving you the injection you can also use your upper arm in the skin covering your triceps muscle  6. Prepare injection site - wash your hands and clean the skin at the injection site with an alcohol swab and let it air dry, do not touch the injection site again before the injection  7. Pull the 2 safety caps straight off - gray/white to uncover the needle cover and the plum cap to uncover the plum activator button, do not remove until immediately prior to injection and do not touch the white needle cover  8. Gently squeeze the area of cleaned skin and hold it firmly to create a firm surface at the selected injection site  9. Put the white needle cover against your skin at the injection site at a 90 degree angle, hold the pen such that you can see the clear medication window  10. Press down and hold the pen firmly against your skin, press the plum activator button to initiate the injection, there will be a click when the injection starts  11. Continue to hold the pen firmly against your skin for about 10-15 seconds - the window will start to turn solid yellow  12. To verify the injection is complete after 10-15 seconds, look and ensure the window is solid yellow and then pull the pen away from your skin  13. Dispose of the used auto-injector pen immediately in your sharps disposal container the needle will be covered automatically  14. If you see any blood at the injection site, press a cotton ball or gauze on the site and maintain pressure until the bleeding stops, do not rub the injection site    Adherence/Missed dose instructions:  If your injection is given more than 3 days after your scheduled injection date ??? consult your pharmacist  for additional instructions on how to adjust your dosing schedule.    Goals of Therapy     - Achieve remission/inactive disease or low/minimal disease activity  - Maintenance of function  - Minimization of systemic manifestations and comorbidities  - Maintenance of effective psychosocial functioning    Side Effects & Monitoring Parameters     ??? Injection site reaction (redness, irritation, inflammation localized to the site of administration)  ??? Signs of a common cold ??? minor sore throat, runny or stuffy nose, etc.  ??? Upset stomach  ??? Headache    The following side effects should be reported to the provider:  ??? Signs of a hypersensitivity reaction ??? rash; hives; itching; red, swollen, blistered, or peeling skin; wheezing; tightness in the chest or throat; difficulty breathing, swallowing, or talking; swelling of the mouth, face, lips, tongue, or throat; etc.  ??? Reduced immune function ??? report signs of infection such as fever; chills; body aches; very bad sore throat; ear or sinus pain; cough; more sputum or change in color of sputum; pain with passing urine; wound that will not heal, etc.  Also at a slightly higher risk of some malignancies (mainly skin and blood cancers) due to this reduced immune function.  o In the case of signs of infection ??? the patient should hold the next dose of Humira?? and call your primary care provider to ensure adequate medical care.  Treatment may be resumed when infection is treated and patient is asymptomatic.  ??? Changes in skin ??? a new growth or lump that forms; changes in shape, size, or color of a previous mole or marking  ??? Signs of unexplained bruising or bleeding ??? throwing up blood or emesis that looks like coffee grounds; black, tarry, or bloody stool; etc.  ??? Signs of new or worsening heart failure ??? shortness of breath; sudden weight gain; heartbeat that is not normal; swelling in the arms or legs that is new or worse      Contraindications, Warnings, & Precautions     ??? Have your bloodwork checked as you have been told by your prescriber  ??? Talk with your doctor if you are pregnant, planning to become pregnant, or breastfeeding  ??? Discuss the possible need for holding your dose(s) of Humira?? when a planned procedure is scheduled with the prescriber as it may delay healing/recovery timeline       Drug/Food Interactions     ??? Medication list reviewed in Epic. The patient was instructed to inform the care team before taking any new medications or supplements. No drug interactions identified.   ??? Talk with you prescriber or pharmacist before receiving any live vaccinations while taking this medication and after you stop taking it    Storage, Handling Precautions, & Disposal     ??? Store this medication in the refrigerator.  Do not freeze  ??? If needed, you may store at room temperature for up to 14 days  ??? Store in original packaging, protected from light  ??? Do not shake  ??? Dispose of used syringes/pens in a sharps disposal container    Current Medications (including OTC/herbals), Comorbidities and Allergies     Current Outpatient Medications   Medication Sig Dispense Refill   ??? acitretin (SORIATANE) 25 MG capsule Take 1 capsule (25 mg total) by mouth daily. 30 capsule 2   ??? adalimumab 80 mg/0.8 mL-40 mg/0.4 mL PnKt Inject the contents of 1 pen (80mg ) under the skin once for initial dose, then inject  the contents of 1 pen (40mg ) every other week beginning 1 week after initial dose. 3 each 0   ??? albuterol HFA 90 mcg/actuation inhaler Inhale 2 puffs by mouth every six (6) hours as needed for wheezing. 54 g 6   ??? ARIPiprazole (ABILIFY) 5 MG tablet      ??? cetirizine (ZYRTEC) 10 MG tablet Take 1 tablet (10 mg total) by mouth daily. 90 tablet 3   ??? clobetasoL (TEMOVATE) 0.05 % ointment Apply topically Two (2) times a day. 120 g 1   ??? clonazePAM (KLONOPIN) 0.5 MG tablet Take 0.5 mg by mouth Three (3) times a day.     ??? clonazePAM (KLONOPIN) 1 MG tablet Take 1 mg by mouth Three (3) times a day.     ??? dexlansoprazole (DEXILANT) 30 mg capsule Take 60 mg by mouth.     ??? diclofenac sodium (VOLTAREN) 1 % gel Apply 2 grams topically to small joint and 4 grams to large joints as needed up to 4 times daily (Patient taking differently: Apply 4 g topically 4 (four) times a day as needed for arthritis or pain. as needed for joint pain) 300 g PRN   ??? dicyclomine (BENTYL) 10 mg capsule Take 2 capsules (20 mg total) by mouth 4 (four) times a day as needed. 240 capsule 1   ??? DULoxetine (CYMBALTA) 60 MG capsule Take 2 capsules (120 mg total) by mouth daily. 60 capsule 3   ??? empty container (BD SHARPS COLLECTOR) Misc Use as directed. 1 each 3   ??? fluticasone propionate (FLONASE) 50 mcg/actuation nasal spray Use 2 sprays in each nostril daily. 16 g 4   ??? folic acid (FOLVITE) 1 MG tablet Take 1 tablet (1 mg total) by mouth daily. 90 tablet 3   ??? gabapentin (NEURONTIN) 300 MG capsule Take 1 capsule (300 mg total) by mouth Three (3) times a day. 270 capsule 0   ??? HUMIRA PEN CITRATE FREE 40 MG/0.4 ML Inject the contents of 1 pen (40 mg total) under the skin every fourteen (14) days. 6 each 3   ??? hydrocortisone 2.5 % ointment Apply 1 application topically Two (2) times a day. Apply in a thin layer to rash. Avoid getting in your eye. 60 g 1   ??? ketotifen (ZADITOR) 0.025 % (0.035 %) ophthalmic solution Administer 1 drop to both eyes Two (2) times a day. 5 mL 0   ??? loratadine (CLARITIN) 10 mg tablet      ??? meclizine (ANTIVERT) 12.5 mg tablet Take 12.5 mg by mouth Three (3) times a day as needed.      ??? methocarbamoL (ROBAXIN) 500 MG tablet Take 1 tablet (500 mg total) by mouth Three (3) times a day. 60 tablet 0   ??? methotrexate 25 mg/mL injection solution Inject 1 mL (25 mg total) under the skin once a week. 4 mL 5   ??? multivitamin with minerals tablet Take 1 tablet by mouth daily.     ??? naloxone (NARCAN) 4 mg/actuation nasal spray One spray in either nostril once for known/suspected opioid overdose. May repeat every 2-3 minutes in alternating nostril til EMS arrives 2 each 0   ??? polyethylene glycol (MIRALAX) 17 gram packet Mix the contents of 1 packet (17 g) in 4-8 ounces of water, juice, soda, coffee, or tea and take by mouth daily for 5 days, then use as needed (Patient taking differently: Take 17 g by mouth daily as needed. ) 30 packet 3   ??? senna (SENOKOT)  8.6 mg tablet Take 1 tablet by mouth daily as needed for constipation. take 1 pill as needed for cosntipation may increase to 2 tabs if no relief     ??? syringe with needle 1 mL 27 x 1/2 Syrg Use as directed for weekly Methotrexate injections 50 each 0   ??? tazarotene (AVAGE) 0.1 % cream Mix with clobetasol and apply twice daily to rash on hands 60 g 5   ??? tiotropium bromide (SPIRIVA RESPIMAT) 1.25 mcg/actuation Mist Inhale 2 puffs (2.5 mcg) BY MOUTH daily. 4 g 5   ??? tiotropium-olodateroL (STIOLTO RESPIMAT) 2.5-2.5 mcg/actuation Mist Inhale 2 puffs daily.     ??? traZODone (DESYREL) 50 MG tablet Take 50 mg by mouth nightly. takes 2 tabs      ??? VYVANSE 30 mg capsule        No current facility-administered medications for this visit.       No Known Allergies    Patient Active Problem List   Diagnosis   ??? Depressive disorder   ??? Psoriasis   ??? Tobacco use disorder   ??? Carpal tunnel syndrome   ??? Anxiety   ??? Acute stress reaction   ??? Grief at loss of child   ??? Trigger thumb of right hand   ??? Benzodiazepine misuse   ??? COPD (chronic obstructive pulmonary disease) (CMS-HCC)   ??? Chronic pain   ??? Cervical spondylosis with myelopathy and radiculopathy   ??? GI bleed   ??? Epicondylitis, lateral   ??? Trochanteric bursitis of right hip   ??? Chronic idiopathic constipation   ??? PTSD (post-traumatic stress disorder)   ??? Myelomalacia of cervical cord (CMS-HCC)       Reviewed and up to date in Epic.    Appropriateness of Therapy     Is medication and dose appropriate based on diagnosis? Yes    Prescription has been clinically reviewed: Yes    Baseline Quality of Life Assessment      Rheumatology:   Quality of Life    On a scale of 1 ??? 10 with 1 representing not at all and 10 representing completely ??? how has your rheumatologic condition affected your:  Daily pain level?: decline to answer  Ability to complete your regular daily tasks (prepare meals, get dressed, etc.)?: decline to answer  Ability to participate in social or family activities?: decline to answer         Financial Information     Medication Assistance provided: Prior Authorization    Anticipated copay of $0.00 reviewed with patient. Verified delivery address.    Delivery Information     Scheduled delivery date: 02/22/2020    Expected start date: 02/24/2020 - wants to take on same day as methotrexate injections Medication will be delivered via Same Day Courier to the prescription address in [facility].  This shipment will not require a signature.      Explained the services we provide at [facility] and that each month we would call to set up refills.  Stressed importance of returning phone calls so that we could ensure they receive their medications in time each month.  Informed patient that we should be setting up refills 7-10 days prior to when they will run out of medication.  A pharmacist will reach out to perform a clinical assessment periodically.  Informed patient that a welcome packet and a drug information handout will be sent.      Patient verbalized understanding of the above information as  well as how to contact the pharmacy at [phone] option 4 with any questions/concerns.  The pharmacy is open Monday through Friday 8:30am-4:30pm.  A pharmacist is available 24/7 via pager to answer any clinical questions they may have.    Patient Specific Needs     - Does the patient have any physical, cognitive, or cultural barriers? No    - Patient prefers to have medications discussed with  Patient     - Is the patient or caregiver able to read and understand education materials at a high school level or above? Yes    - Patient's primary language is  English     - Is the patient high risk? No     - Does the patient require a Care Management Plan? No     - Does the patient require physician intervention or other additional services (i.e. nutrition, smoking cessation, social work)? No      Karene Fry Amandeep Hogston  [facility] Specialty Pharmacist

## 2020-02-17 NOTE — Unmapped (Signed)
[facility] Refill Coordination Note    Specialty Medication(s) to be Shipped:   Inflammatory Disorders: methotrexate 25mg /ml    Other medication(s) to be shipped: syringes, gabapentin     Michele Miller, DOB: 04/17/1961  Phone: [phone] (home)       All above HIPAA information was verified with patient.     Was a Nurse, learning disability used for this call? No    Completed refill call assessment today to schedule patient's medication shipment from the [facility] [phone]).       Specialty medication(s) and dose(s) confirmed: Regimen is correct and unchanged.   Changes to medications: [name] reports no changes at this time.  Changes to insurance: No  Questions for the pharmacist: No    Confirmed patient received Welcome Packet with first shipment. The patient will receive a drug information handout for each medication shipped and additional FDA Medication Guides as required.       DISEASE/MEDICATION-SPECIFIC INFORMATION        For patients on injectable medications: Patient currently has 2 doses left.  Next injection is scheduled for 02/17/2020.    SPECIALTY MEDICATION ADHERENCE     Medication Adherence    Patient reported X missed doses in the last month: 0  Specialty Medication: methotrexate 25mg /ml          methotrexate 25mg /ml: 14 days of medicine on hand       SHIPPING     Shipping address confirmed in Epic.     Delivery Scheduled: Yes, Expected medication delivery date: 02/22/2020.     Medication will be delivered via Same Day Courier to the prescription address in Epic [facility].    Lupita Shutter   [facility] Specialty Pharmacist

## 2020-02-19 MED ORDER — GABAPENTIN 300 MG CAPSULE
ORAL_CAPSULE | Freq: Three times a day (TID) | ORAL | 0 refills | 90.00000 days
Start: 2020-02-19 — End: 2021-02-18

## 2020-02-22 MED FILL — BD TUBERCULIN SYRINGE 1 ML 27 X 1/2": 28 days supply | Qty: 4 | Fill #0

## 2020-02-22 MED FILL — BD TUBERCULIN SYRINGE 1 ML 27 X 1/2": 28 days supply | Qty: 4 | Fill #0 | Status: AC

## 2020-02-22 MED FILL — HUMIRA PEN CITRATE FREE STARTER PACK FOR PS/UV 1X 80 MG/0.8 ML, 2X 40 MG/0.4 ML: 35 days supply | Qty: 3 | Fill #0 | Status: AC

## 2020-02-22 MED FILL — METHOTREXATE SODIUM 25 MG/ML INJECTION SOLUTION: 28 days supply | Qty: 4 | Fill #1 | Status: AC

## 2020-02-22 MED FILL — METHOTREXATE SODIUM 25 MG/ML INJECTION SOLUTION: SUBCUTANEOUS | 28 days supply | Qty: 4 | Fill #1

## 2020-02-22 NOTE — Unmapped (Signed)
PA initiated for tazarotene cream via CMM. Key is [id number].

## 2020-02-23 ENCOUNTER — Ambulatory Visit: Payer: Self-pay | Admitting: Nurse Practitioner

## 2020-02-23 NOTE — Unmapped (Signed)
PA denial for tazarotene due to diagnosis. Scanned into media.

## 2020-02-23 NOTE — Unmapped (Signed)
I attempted to reinitiate the PA with diagnosis of psoriasis; however, since it was already denied, it stated we had to go the appeal route.

## 2020-03-16 NOTE — Unmapped (Signed)
Call from [facility] with [facility].  Apologized for the lack of following up with patient regarding home light box.  States the person that was assigned to patient did not do her job appropriately and is no longer working for them.  States patient has been contacted about pursuing light box after so much time has passed and patient wants to make sure it is OK with Dr. Dorna Bloom to pursue this now.  Meghan would appreciate a call back to [phone].

## 2020-03-21 ENCOUNTER — Other Ambulatory Visit: Payer: Self-pay | Admitting: Nurse Practitioner

## 2020-03-21 DIAGNOSIS — L4050 Arthropathic psoriasis, unspecified: Principal | ICD-10-CM

## 2020-03-21 MED ORDER — GABAPENTIN 300 MG CAPSULE
ORAL_CAPSULE | Freq: Three times a day (TID) | ORAL | 0 refills | 90 days
Start: 2020-03-21 — End: 2021-03-21

## 2020-03-21 MED ORDER — STIOLTO RESPIMAT 2.5-2.5 MCG/ACT IN AERS: 2.0000 | INHALATION_SPRAY | Freq: Every day | RESPIRATORY_TRACT | 4 refills | Status: DC

## 2020-03-21 NOTE — Unmapped (Signed)
[facility] Clinical Assessment & Refill Coordination Note    [name], DOB: 1961/06/18  Phone: [phone] (home)     All above HIPAA information was verified with patient.     Was a Nurse, learning disability used for this call? No    Specialty Medication(s):   Inflammatory Disorders: Humira and methotrexate (injectable)     Current Outpatient Medications   Medication Sig Dispense Refill   ??? acitretin (SORIATANE) 25 MG capsule Take 1 capsule (25 mg total) by mouth daily. 30 capsule 2   ??? adalimumab 80 mg/0.8 mL-40 mg/0.4 mL PnKt Inject the contents of 1 pen (80mg ) under the skin once for initial dose, then inject the contents of 1 pen (40mg ) every other week beginning 1 week after initial dose. 3 each 0   ??? albuterol HFA 90 mcg/actuation inhaler Inhale 2 puffs by mouth every six (6) hours as needed for wheezing. 54 g 6   ??? ARIPiprazole (ABILIFY) 5 MG tablet      ??? cetirizine (ZYRTEC) 10 MG tablet Take 1 tablet (10 mg total) by mouth daily. 90 tablet 3   ??? clobetasoL (TEMOVATE) 0.05 % ointment Apply topically Two (2) times a day. 120 g 1   ??? clonazePAM (KLONOPIN) 0.5 MG tablet Take 0.5 mg by mouth Three (3) times a day.     ??? clonazePAM (KLONOPIN) 1 MG tablet Take 1 mg by mouth Three (3) times a day.     ??? dexlansoprazole (DEXILANT) 30 mg capsule Take 60 mg by mouth.     ??? diclofenac sodium (VOLTAREN) 1 % gel Apply 2 grams topically to small joint and 4 grams to large joints as needed up to 4 times daily (Patient taking differently: Apply 4 g topically 4 (four) times a day as needed for arthritis or pain. as needed for joint pain) 300 g PRN   ??? dicyclomine (BENTYL) 10 mg capsule Take 2 capsules (20 mg total) by mouth 4 (four) times a day as needed. 240 capsule 1   ??? DULoxetine (CYMBALTA) 60 MG capsule Take 2 capsules (120 mg total) by mouth daily. 60 capsule 3   ??? empty container (BD SHARPS COLLECTOR) Misc Use as directed. 1 each 3   ??? fluticasone propionate (FLONASE) 50 mcg/actuation nasal spray Use 2 sprays in each nostril daily. 16 g 4   ??? folic acid (FOLVITE) 1 MG tablet Take 1 tablet (1 mg total) by mouth daily. 90 tablet 3   ??? gabapentin (NEURONTIN) 300 MG capsule Take 1 capsule (300 mg total) by mouth Three (3) times a day. 270 capsule 0   ??? HUMIRA PEN CITRATE FREE 40 MG/0.4 ML Inject the contents of 1 pen (40 mg total) under the skin every fourteen (14) days. 6 each 3   ??? hydrocortisone 2.5 % ointment Apply 1 application topically Two (2) times a day. Apply in a thin layer to rash. Avoid getting in your eye. 60 g 1   ??? ketotifen (ZADITOR) 0.025 % (0.035 %) ophthalmic solution Administer 1 drop to both eyes Two (2) times a day. 5 mL 0   ??? loratadine (CLARITIN) 10 mg tablet      ??? meclizine (ANTIVERT) 12.5 mg tablet Take 12.5 mg by mouth Three (3) times a day as needed.      ??? methocarbamoL (ROBAXIN) 500 MG tablet Take 1 tablet (500 mg total) by mouth Three (3) times a day. 60 tablet 0   ??? methotrexate 25 mg/mL injection solution Inject 1  mL (25mg ) under the skin once a week 4 mL 5   ??? multivitamin with minerals tablet Take 1 tablet by mouth daily.     ??? naloxone (NARCAN) 4 mg/actuation nasal spray One spray in either nostril once for known/suspected opioid overdose. May repeat every 2-3 minutes in alternating nostril til EMS arrives 2 each 0   ??? polyethylene glycol (MIRALAX) 17 gram packet Mix the contents of 1 packet (17 g) in 4-8 ounces of water, juice, soda, coffee, or tea and take by mouth daily for 5 days, then use as needed (Patient taking differently: Take 17 g by mouth daily as needed. ) 30 packet 3   ??? senna (SENOKOT) 8.6 mg tablet Take 1 tablet by mouth daily as needed for constipation. take 1 pill as needed for cosntipation may increase to 2 tabs if no relief     ??? syringe with needle 1 mL 27 x 1/2 Syrg Use as directed for weekly methotrexate injections 100 each PRN   ??? tazarotene (AVAGE) 0.1 % cream Mix with clobetasol and apply twice daily to rash on hands 60 g 5   ??? tiotropium bromide (SPIRIVA RESPIMAT) 1.25 mcg/actuation Mist Inhale 2 puffs (2.5 mcg) BY MOUTH daily. 4 g 5   ??? tiotropium-olodateroL (STIOLTO RESPIMAT) 2.5-2.5 mcg/actuation Mist Inhale 2 puffs daily.     ??? traZODone (DESYREL) 50 MG tablet Take 50 mg by mouth nightly. takes 2 tabs      ??? VYVANSE 30 mg capsule        No current facility-administered medications for this visit.        Changes to medications: [name] reports no changes at this time.    No Known Allergies    Changes to allergies: No    SPECIALTY MEDICATION ADHERENCE     Humira 40mg /0.25ml: 0 days of medicine on hand   methotrexate 25mg /ml: 0 days of medicine on hand     Medication Adherence    Patient reported X missed doses in the last month: 0  Specialty Medication: Humira 40mg /0.36ml  Patient is on additional specialty medications: Yes  Additional Specialty Medications: methotrexate 25mg /ml  Patient Reported Additional Medication X Missed Doses in the Last Month: 0          Specialty medication(s) dose(s) confirmed: Regimen is correct and unchanged.     Are there any concerns with adherence? No    Adherence counseling provided? Not needed    CLINICAL MANAGEMENT AND INTERVENTION      Clinical Benefit Assessment:    Do you feel the medicine is effective or helping your condition? Yes    Clinical Benefit counseling provided? Not needed    Adverse Effects Assessment:    Are you experiencing any side effects? No    Are you experiencing difficulty administering your medicine? No    Quality of Life Assessment:    Rheumatology:   Quality of Life    On a scale of 1 ??? 10 with 1 representing not at all and 10 representing completely ??? how has your rheumatologic condition affected your:  Daily pain level?: decline to answer  Ability to complete your regular daily tasks (prepare meals, get dressed, etc.)?: decline to answer  Ability to participate in social or family activities?: decline to answer         Have you discussed this with your provider? Not needed Therapy Appropriateness:    Is therapy appropriate? Yes, therapy is appropriate and should be continued    DISEASE/MEDICATION-SPECIFIC INFORMATION  For patients on injectable medications: Patient currently has 0 Humira doses left and the next injection is scheduled for 03/30/2020.  Patient currently has 0 methotrexate doses left and the next injection is scheduled for 03/23/2020.    PATIENT SPECIFIC NEEDS     - Does the patient have any physical, cognitive, or cultural barriers? No    - Is the patient high risk? No     - Does the patient require a Care Management Plan? No     - Does the patient require physician intervention or other additional services (i.e. nutrition, smoking cessation, social work)? No      SHIPPING     Specialty Medication(s) to be Shipped:   Inflammatory Disorders: Humira 40mg /0.42ml and methotrexate 25mg /ml    Other medication(s) to be shipped: syringes, fluticasone, folic acid     Changes to insurance: No    Delivery Scheduled: Yes, Expected medication delivery date: 03/22/2020.     Medication will be delivered via Same Day Courier to the confirmed prescription address in [facility]    The patient will receive a drug information handout for each medication shipped and additional FDA Medication Guides as required.  Verified that patient has previously received a Conservation officer, historic buildings.    All of the patient's questions and concerns have been addressed.    Karene Fry Angelly Spearing   [facility] [employer] [facility] Specialty Pharmacist

## 2020-03-21 NOTE — Telephone Encounter (Signed)
Routing to provider  

## 2020-03-21 NOTE — Telephone Encounter (Signed)
Medication Refill - Medication: Tiotropium Bromide-Olodaterol (STIOLTO RESPIMAT) 2.5-2.5 MCG/ACT AERS   Has the patient contacted their pharmacy? No. (Agent: If no, request that the patient contact the pharmacy for the refill.) (Agent: If yes, when and what did the pharmacy advise?)  Preferred Pharmacy (with phone number or street name): [facility] [place], [place] - [address]  [place], [place] [address]  Phone:  [phone] Fax:  [phone]   Agent: Please be advised that RX refills may take up to 3 business days. We ask that you follow-up with your pharmacy.

## 2020-03-22 MED FILL — BD TUBERCULIN SYRINGE 1 ML 27 X 1/2": 28 days supply | Qty: 4 | Fill #1 | Status: AC

## 2020-03-22 MED FILL — HUMIRA PEN CITRATE FREE 40 MG/0.4 ML: SUBCUTANEOUS | 28 days supply | Qty: 2 | Fill #0

## 2020-03-22 MED FILL — METHOTREXATE SODIUM 25 MG/ML INJECTION SOLUTION: SUBCUTANEOUS | 28 days supply | Qty: 4 | Fill #2

## 2020-03-22 MED FILL — FLUTICASONE PROPIONATE 50 MCG/ACTUATION NASAL SPRAY,SUSPENSION: 30 days supply | Qty: 16 | Fill #2 | Status: AC

## 2020-03-22 MED FILL — METHOTREXATE SODIUM 25 MG/ML INJECTION SOLUTION: 28 days supply | Qty: 4 | Fill #2 | Status: AC

## 2020-03-22 MED FILL — FOLIC ACID 1 MG TABLET: ORAL | 90 days supply | Qty: 90 | Fill #2

## 2020-03-22 MED FILL — FOLIC ACID 1 MG TABLET: 90 days supply | Qty: 90 | Fill #2 | Status: AC

## 2020-03-22 MED FILL — FLUTICASONE PROPIONATE 50 MCG/ACTUATION NASAL SPRAY,SUSPENSION: NASAL | 30 days supply | Qty: 16 | Fill #2

## 2020-03-22 MED FILL — HUMIRA PEN CITRATE FREE 40 MG/0.4 ML: 28 days supply | Qty: 2 | Fill #0 | Status: AC

## 2020-03-22 MED FILL — BD TUBERCULIN SYRINGE 1 ML 27 X 1/2": 28 days supply | Qty: 4 | Fill #1

## 2020-03-28 ENCOUNTER — Other Ambulatory Visit: Payer: Self-pay

## 2020-03-28 ENCOUNTER — Encounter: Payer: Self-pay | Admitting: Family Medicine

## 2020-03-28 ENCOUNTER — Telehealth: Payer: Self-pay | Admitting: Nurse Practitioner

## 2020-03-28 ENCOUNTER — Ambulatory Visit (INDEPENDENT_AMBULATORY_CARE_PROVIDER_SITE_OTHER): Payer: Medicare Other | Admitting: Family Medicine

## 2020-03-28 VITALS — BP 109/73 | HR 77 | Temp 98.1°F | Wt 134.0 lb

## 2020-03-28 DIAGNOSIS — M79641 Pain in right hand: Secondary | ICD-10-CM | POA: Diagnosis not present

## 2020-03-28 MED ORDER — AMOXICILLIN-POT CLAVULANATE 875-125 MG PO TABS: 1.0000 | ORAL_TABLET | Freq: Two times a day (BID) | ORAL | 0 refills | Status: DC

## 2020-03-28 MED ORDER — KETOROLAC TROMETHAMINE 60 MG/2ML IM SOLN
60.0000 mg | Freq: Once | INTRAMUSCULAR | Status: AC
  Administered 2020-03-28: 60 mg via INTRAMUSCULAR

## 2020-03-28 NOTE — Telephone Encounter (Signed)
Patient suspects she was bitten by a spider or another insect. States that her hand is "hot" and swollen. No sign of fever or other immune response to being bitten. Would like for Cannady NP to giver her a call with any advice.

## 2020-03-28 NOTE — Progress Notes (Signed)
BP 109/73    Pulse 77    Temp 98.1 F (36.7 C) (Oral)    Wt 134 lb (60.8 kg)    SpO2 98%    BMI 23.00 kg/m    Subjective:    Patient ID: Emily Johnston, female    DOB: [birth year], 59 y.o.   MRN: [id number]  HPI: GABBRIELLE Johnston is a 59 y.o. female  Chief Complaint  Patient presents with   Rash    right hand index finger since Monday, painful and itchy   Started several days ago with a big bite of some sort on right hand index finger. The past few days has become increasingly painful, red, swollen in that area and now whole right hand is swollen and stiff and warm to touch. Denies fever, chills, sweats, body aches, HAs. Has been keeping area clean with alcohol.   Relevant past medical, surgical, family and social history reviewed and updated as indicated. Interim medical history since our last visit reviewed. Allergies and medications reviewed and updated.  Review of Systems  Per HPI unless specifically indicated above     Objective:    BP 109/73    Pulse 77    Temp 98.1 F (36.7 C) (Oral)    Wt 134 lb (60.8 kg)    SpO2 98%    BMI 23.00 kg/m   Wt Readings from Last 3 Encounters:  03/28/20 134 lb (60.8 kg)  09/06/19 152 lb (68.9 kg)  06/09/19 134 lb (60.8 kg)    Physical Exam Vitals and nursing note reviewed.  Constitutional:      Appearance: Normal appearance. She is not ill-appearing.  HENT:     Head: Atraumatic.  Eyes:     Extraocular Movements: Extraocular movements intact.     Conjunctiva/sclera: Conjunctivae normal.  Cardiovascular:     Rate and Rhythm: Normal rate and regular rhythm.     Heart sounds: Normal heart sounds.  Pulmonary:     Effort: Pulmonary effort is normal.     Breath sounds: Normal breath sounds.  Musculoskeletal:        General: Normal range of motion.     Cervical back: Normal range of motion and neck supple.  Skin:    General: Skin is warm.     Findings: Erythema (at site of bite wound base of right index finger.  erythematous, edematous and ttp) present.  Neurological:     Mental Status: She is alert and oriented to person, place, and time.  Psychiatric:        Mood and Affect: Mood normal.        Thought Content: Thought content normal.        Judgment: Judgment normal.     Results for orders placed or performed in visit on 10/24/19  Comprehensive metabolic panel  Result Value Ref Range   Glucose 80 65 - 99 mg/dL   BUN 10 6 - 24 mg/dL   Creatinine, Ser 0.78 0.57 - 1.00 mg/dL   GFR calc non Af Amer 85 >59 mL/min/1.73   GFR calc Af Amer 98 >59 mL/min/1.73   BUN/Creatinine Ratio 13 9 - 23   Sodium 143 134 - 144 mmol/L   Potassium 4.4 3.5 - 5.2 mmol/L   Chloride 104 96 - 106 mmol/L   CO2 27 20 - 29 mmol/L   Calcium 9.6 8.7 - 10.2 mg/dL   Total Protein 6.4 6.0 - 8.5 g/dL   Albumin 4.0 3.8 - 4.9 g/dL   Globulin, Total  2.4 1.5 - 4.5 g/dL   Albumin/Globulin Ratio 1.7 1.2 - 2.2   Bilirubin Total 0.3 0.0 - 1.2 mg/dL   Alkaline Phosphatase 104 39 - 117 IU/L   AST 19 0 - 40 IU/L   ALT 12 0 - 32 IU/L  Lipid Panel w/o Chol/HDL Ratio  Result Value Ref Range   Cholesterol, Total 209 (H) 100 - 199 mg/dL   Triglycerides 97 0 - 149 mg/dL   HDL 45 >39 mg/dL   VLDL Cholesterol Cal 18 5 - 40 mg/dL   LDL Chol Calc (NIH) 146 (H) 0 - 99 mg/dL  VITAMIN D 25 Hydroxy (Vit-D Deficiency, Fractures)  Result Value Ref Range   Vit D, 25-Hydroxy 39.6 30.0 - 100.0 ng/mL  Vitamin B12  Result Value Ref Range   Vitamin B-12 916 232 - 1,245 pg/mL  TSH  Result Value Ref Range   TSH 0.873 0.450 - 4.500 uIU/mL  CBC with Differential/Platelet  Result Value Ref Range   WBC 6.1 3.4 - 10.8 x10E3/uL   RBC 4.39 3.77 - 5.28 x10E6/uL   Hemoglobin 14.1 11.1 - 15.9 g/dL   Hematocrit 42.6 34.0 - 46.6 %   MCV 97 79 - 97 fL   MCH 32.1 26.6 - 33.0 pg   MCHC 33.1 31 - 35 g/dL   RDW 13.3 11.7 - 15.4 %   Platelets 290 150 - 450 x10E3/uL   Neutrophils 53 Not Estab. %   Lymphs 39 Not Estab. %   Monocytes 6 Not Estab. %    Eos 1 Not Estab. %   Basos 1 Not Estab. %   Neutrophils Absolute 3.2 1 - 7 x10E3/uL   Lymphocytes Absolute 2.4 0 - 3 x10E3/uL   Monocytes Absolute 0.4 0 - 0 x10E3/uL   EOS (ABSOLUTE) 0.1 0.0 - 0.4 x10E3/uL   Basophils Absolute 0.1 0 - 0 x10E3/uL   Immature Granulocytes 0 Not Estab. %   Immature Grans (Abs) 0.0 0.0 - 0.1 x10E3/uL      Assessment & Plan:   Problem List Items Addressed This Visit    None    Visit Diagnoses    Right hand pain    -  Primary   Concern for early infection, tx with augmentin, soaks, arm elevation. Keep covered with neosporin and bandage. Strict return precautions reviewed   Relevant Medications   ketorolac (TORADOL) injection 60 mg (Completed)       Follow up plan: Return if symptoms worsen or fail to improve.

## 2020-04-09 ENCOUNTER — Ambulatory Visit (INDEPENDENT_AMBULATORY_CARE_PROVIDER_SITE_OTHER): Payer: Medicare Other | Admitting: Family Medicine

## 2020-04-09 ENCOUNTER — Encounter: Payer: Self-pay | Admitting: Family Medicine

## 2020-04-09 ENCOUNTER — Other Ambulatory Visit: Payer: Self-pay

## 2020-04-09 ENCOUNTER — Ambulatory Visit: Payer: Self-pay | Admitting: *Deleted

## 2020-04-09 VITALS — BP 125/85 | HR 77 | Temp 98.4°F | Wt 132.0 lb

## 2020-04-09 DIAGNOSIS — R21 Rash and other nonspecific skin eruption: Secondary | ICD-10-CM | POA: Diagnosis not present

## 2020-04-09 MED ORDER — TRAMADOL 50 MG TABLET
0 days
Start: 2020-04-09 — End: ?

## 2020-04-09 MED ORDER — AMOXICILLIN-POT CLAVULANATE 875-125 MG PO TABS: 1.0000 | ORAL_TABLET | Freq: Two times a day (BID) | ORAL | 0 refills | Status: DC

## 2020-04-09 MED ORDER — TRAMADOL HCL 50 MG PO TABS: 50.0000 mg | ORAL_TABLET | Freq: Three times a day (TID) | ORAL | 0 refills | Status: AC | PRN

## 2020-04-09 NOTE — Progress Notes (Signed)
BP 125/85   Pulse 77   Temp 98.4 F (36.9 C) (Oral)   Wt 132 lb (59.9 kg)   SpO2 95%   BMI 22.66 kg/m    Subjective:    Patient ID: Emily Johnston, female    DOB: Jul 23, 1961, 59 y.o.   MRN: [id number]  HPI: Emily Johnston is a 59 y.o. female  Chief Complaint  Patient presents with  . Rash    pt states she's done with abx but not feeling better   Here today with continued pain, redness and swelling of right hand. Previously had a suspected infected insect bite on index finger but now with another area similar to that of possible bite with swelling in the hand, redness and significant pain. Also having psoriasis flare on this hand in 2 places. Not using steroid cream currently but has at home. Denies fever, chills, malaise. Complains of significant pain.   Relevant past medical, surgical, family and social history reviewed and updated as indicated. Interim medical history since our last visit reviewed. Allergies and medications reviewed and updated.  Review of Systems  Per HPI unless specifically indicated above     Objective:    BP 125/85   Pulse 77   Temp 98.4 F (36.9 C) (Oral)   Wt 132 lb (59.9 kg)   SpO2 95%   BMI 22.66 kg/m   Wt Readings from Last 3 Encounters:  04/09/20 132 lb (59.9 kg)  03/28/20 134 lb (60.8 kg)  09/06/19 152 lb (68.9 kg)    Physical Exam Vitals and nursing note reviewed.  Constitutional:      Appearance: Normal appearance. She is not ill-appearing.  HENT:     Head: Atraumatic.  Eyes:     Extraocular Movements: Extraocular movements intact.     Conjunctiva/sclera: Conjunctivae normal.  Cardiovascular:     Rate and Rhythm: Normal rate and regular rhythm.     Heart sounds: Normal heart sounds.  Pulmonary:     Effort: Pulmonary effort is normal.     Breath sounds: Normal breath sounds.  Musculoskeletal:        General: Normal range of motion.     Cervical back: Normal range of motion and neck supple.  Skin:     General: Skin is warm and dry.     Findings: Erythema (right hand diffusely erythematous and edematous with several areas of ulceration) and rash (two areas of silvery plaques on right hand) present.  Neurological:     Mental Status: She is alert and oriented to person, place, and time.  Psychiatric:        Mood and Affect: Mood normal.        Thought Content: Thought content normal.        Judgment: Judgment normal.     Results for orders placed or performed in visit on 10/24/19  Comprehensive metabolic panel  Result Value Ref Range   Glucose 80 65 - 99 mg/dL   BUN 10 6 - 24 mg/dL   Creatinine, Ser 0.78 0.57 - 1.00 mg/dL   GFR calc non Af Amer 85 >59 mL/min/1.73   GFR calc Af Amer 98 >59 mL/min/1.73   BUN/Creatinine Ratio 13 9 - 23   Sodium 143 134 - 144 mmol/L   Potassium 4.4 3.5 - 5.2 mmol/L   Chloride 104 96 - 106 mmol/L   CO2 27 20 - 29 mmol/L   Calcium 9.6 8.7 - 10.2 mg/dL   Total Protein 6.4 6.0 - 8.5 g/dL  Albumin 4.0 3.8 - 4.9 g/dL   Globulin, Total 2.4 1.5 - 4.5 g/dL   Albumin/Globulin Ratio 1.7 1.2 - 2.2   Bilirubin Total 0.3 0.0 - 1.2 mg/dL   Alkaline Phosphatase 104 39 - 117 IU/L   AST 19 0 - 40 IU/L   ALT 12 0 - 32 IU/L  Lipid Panel w/o Chol/HDL Ratio  Result Value Ref Range   Cholesterol, Total 209 (H) 100 - 199 mg/dL   Triglycerides 97 0 - 149 mg/dL   HDL 45 >39 mg/dL   VLDL Cholesterol Cal 18 5 - 40 mg/dL   LDL Chol Calc (NIH) 146 (H) 0 - 99 mg/dL  VITAMIN D 25 Hydroxy (Vit-D Deficiency, Fractures)  Result Value Ref Range   Vit D, 25-Hydroxy 39.6 30.0 - 100.0 ng/mL  Vitamin B12  Result Value Ref Range   Vitamin B-12 916 232 - 1,245 pg/mL  TSH  Result Value Ref Range   TSH 0.873 0.450 - 4.500 uIU/mL  CBC with Differential/Platelet  Result Value Ref Range   WBC 6.1 3.4 - 10.8 x10E3/uL   RBC 4.39 3.77 - 5.28 x10E6/uL   Hemoglobin 14.1 11.1 - 15.9 g/dL   Hematocrit 42.6 34.0 - 46.6 %   MCV 97 79 - 97 fL   MCH 32.1 26.6 - 33.0 pg   MCHC 33.1 31 -  35 g/dL   RDW 13.3 11.7 - 15.4 %   Platelets 290 150 - 450 x10E3/uL   Neutrophils 53 Not Estab. %   Lymphs 39 Not Estab. %   Monocytes 6 Not Estab. %   Eos 1 Not Estab. %   Basos 1 Not Estab. %   Neutrophils Absolute 3.2 1 - 7 x10E3/uL   Lymphocytes Absolute 2.4 0 - 3 x10E3/uL   Monocytes Absolute 0.4 0 - 0 x10E3/uL   EOS (ABSOLUTE) 0.1 0.0 - 0.4 x10E3/uL   Basophils Absolute 0.1 0 - 0 x10E3/uL   Immature Granulocytes 0 Not Estab. %   Immature Grans (Abs) 0.0 0.0 - 0.1 x10E3/uL      Assessment & Plan:   Problem List Items Addressed This Visit    None    Visit Diagnoses    Rash    -  Primary   Tx wtih another course of abx and clobetasol cream which she has at home. Very small tramadol script given for severe pain, not to use w/in 12 hrs of klonopin       Follow up plan: Return in about 1 week (around 04/16/2020) for rash f/u if not improving.

## 2020-04-09 NOTE — Telephone Encounter (Signed)
Pt called in c/o right hand being very swollen, red, itching and painful where she was bitten by an insect.  She was seen by Merrie Roof a week ago and given antibiotics.   She completed them.   But now her right hand is draining a clear fluid from th bite.   Bite is located on the palm side just below her little finger.   She said it's itching more than anything.  She's taking Benadryl.  I made her an appt for today with Merrie Roof for an in office visit at 3:20.   COVID-19 questionnaire completed indicating an office visit was fine.  See triage notes.  I sent my notes to [facility].    Reason for Disposition . [1] Red or very tender (to touch) area AND [2] getting larger over 48 hours after the bite  Answer Assessment - Initial Assessment Questions 1. TYPE of INSECT: "What type of insect was it?"      Last week I was bit by spider and given antibiotics.   My hand is now swollen, red now.   I finished the antibiotic.  Right hand. It's really swollen inside my palm and real red near where I got bit.  It's oozing and itching.   I took some Benadryl.   I also have psoriasis.  It's clear oozing but "icky".     2. ONSET: "When did you get bitten?"      Sunday 3. LOCATION: "Where is the insect bite located?"      Right hand 4. REDNESS: "Is the area red or pink?" If Yes, ask: "What size is area of redness?" (inches or cm). "When did the redness start?"     Yes.  Red and swollen.   The bite is below my little finger on the palm side.  5. PAIN: "Is there any pain?" If Yes, ask: "How bad is it?"  (Scale 1-10; or mild, moderate, severe)     Yes it's hurting 6. ITCHING: "Does it itch?" If Yes, ask: "How bad is the itch?"    - MILD: doesn't interfere with normal activities   - MODERATE-SEVERE: interferes with work, school, sleep, or other activities      Real bad itching 7. SWELLING: "How big is the swelling?" (inches, cm, or compare to coins)     Palm is swollen. 8. OTHER  SYMPTOMS: "Do you have any other symptoms?"  (e.g., difficulty breathing, hives)     No 9. PREGNANCY: "Is there any chance you are pregnant?" "When was your last menstrual period?"      N/A  Protocols used: INSECT BITE-A-AH

## 2020-04-13 ENCOUNTER — Ambulatory Visit: Admit: 2020-04-13 | Discharge: 2020-04-14 | Payer: MEDICARE

## 2020-04-13 ENCOUNTER — Ambulatory Visit
Admit: 2020-04-13 | Discharge: 2020-04-14 | Payer: MEDICARE | Attending: Orthopaedic Surgery | Primary: Orthopaedic Surgery

## 2020-04-13 DIAGNOSIS — L4050 Arthropathic psoriasis, unspecified: Principal | ICD-10-CM

## 2020-04-13 DIAGNOSIS — M5412 Radiculopathy, cervical region: Principal | ICD-10-CM

## 2020-04-13 MED ORDER — GABAPENTIN 300 MG CAPSULE
ORAL_CAPSULE | Freq: Three times a day (TID) | ORAL | 0 refills | 90.00000 days | Status: CP
Start: 2020-04-13 — End: 2021-04-13
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
## 2020-04-13 NOTE — Unmapped (Signed)
Your diagnosis: Cervical fusion    Recommendations/Plan  ?? Medications: Please ask your Medical Doctor re: if Celebrex would be safe for you to take.  I'd be glad to prescribe this.  ?? Activity: Activities as tolerated.  ?? Imaging studies: Cervical AP, lateral, flexion, and extension views (prior to next visit).  ?? Surgical Care: Continued surgical follow-up.    ?? Follow-up: At 1 year after surgery.    Contact our nursing team via MyChart or [phone] with any clinical questions/concerns.  Our scheduling team can be reached at [phone].

## 2020-04-23 ENCOUNTER — Ambulatory Visit: Payer: Medicare Other | Admitting: Nurse Practitioner

## 2020-04-23 NOTE — Unmapped (Signed)
[facility] Refill Coordination Note    Specialty Medication(s) to be Shipped:   Inflammatory Disorders: Humira and methotrexate (injectable)    Other medication(s) to be shipped: fluticasone, syringes     Michele Miller, DOB: 1961/08/12  Phone: [phone] (home)       All above HIPAA information was verified with patient.     Was a Nurse, learning disability used for this call? No    Completed refill call assessment today to schedule patient's medication shipment from the [facility] [phone]).       Specialty medication(s) and dose(s) confirmed: Regimen is correct and unchanged.   Changes to medications: [name] reports no changes at this time.  Changes to insurance: No  Questions for the pharmacist: No    Confirmed patient received Welcome Packet with first shipment. The patient will receive a drug information handout for each medication shipped and additional FDA Medication Guides as required.       DISEASE/MEDICATION-SPECIFIC INFORMATION        For patients on injectable medications: Patient currently has 0 doses left.  Next injection is scheduled for 8/13.    SPECIALTY MEDICATION ADHERENCE     Medication Adherence    Patient reported X missed doses in the last month: 0  Specialty Medication: Humira  Patient is on additional specialty medications: Yes  Additional Specialty Medications: Methotrexate  Patient Reported Additional Medication X Missed Doses in the Last Month: 0  Patient is on more than two specialty medications: No  Any gaps in refill history greater than 2 weeks in the last 3 months: no  Demonstrates understanding of importance of adherence: yes  Informant: patient                Humira 40mg /0.72ml: Patient has 0 days of medication on hand    Methotrexate 25mg /ml: Patient has 0 days of medication on hand      SHIPPING     Shipping address confirmed in Epic.     Delivery Scheduled: Yes, Expected medication delivery date: 8/11.     Medication will be delivered via Same Day Courier to the prescription address in Epic WAM.    Michele Miller   [facility] Specialty Technician

## 2020-04-25 MED FILL — FLUTICASONE PROPIONATE 50 MCG/ACTUATION NASAL SPRAY,SUSPENSION: 30 days supply | Qty: 16 | Fill #3 | Status: AC

## 2020-04-25 MED FILL — METHOTREXATE SODIUM 25 MG/ML INJECTION SOLUTION: 28 days supply | Qty: 4 | Fill #3 | Status: AC

## 2020-04-25 MED FILL — BD TUBERCULIN SYRINGE 1 ML 27 X 1/2": 28 days supply | Qty: 4 | Fill #2 | Status: AC

## 2020-04-25 MED FILL — HUMIRA PEN CITRATE FREE 40 MG/0.4 ML: SUBCUTANEOUS | 28 days supply | Qty: 2 | Fill #1

## 2020-04-25 MED FILL — BD TUBERCULIN SYRINGE 1 ML 27 X 1/2": 28 days supply | Qty: 4 | Fill #2

## 2020-04-25 MED FILL — FLUTICASONE PROPIONATE 50 MCG/ACTUATION NASAL SPRAY,SUSPENSION: NASAL | 30 days supply | Qty: 16 | Fill #3

## 2020-04-25 MED FILL — METHOTREXATE SODIUM 25 MG/ML INJECTION SOLUTION: SUBCUTANEOUS | 28 days supply | Qty: 4 | Fill #3

## 2020-04-25 MED FILL — HUMIRA PEN CITRATE FREE 40 MG/0.4 ML: 28 days supply | Qty: 2 | Fill #1 | Status: AC

## 2020-04-29 NOTE — Unmapped (Signed)
Dermatology Note     Assessment and Plan:      Hand dermatitis in patient with hx of PsA- improved on MTX (25mg  weekly), acitretin (25mg  daily), Humira  - Rheumatology prescribing methotrexate at 25 mg weekly for psoriatic arthritis; no prior history of cutaneous psoriasis  - DDx includes psoriasis (favored) versus allergic contact dermatitis  - Has responded to short courses of prednisone in the past  - Failed Otezla (GI side effects and headaches)  - Currently on acitretin 25 mg daily; discussing cutting dose in half as patient is on several systemic agents; pt prefers to stay at her current dose currently   - Currently using clobetasol ointment ointment  - Continue Humira; discussed that it may take another 6 weeks to see full effect   - Pt expecting to get light box soon (approved with insurance) Dance movement psychotherapist); pt still waiting  - acitretin (SORIATANE) 25 MG capsule; Take 1 capsule (25 mg total) by mouth daily.  Dispense: 30 capsule; Refill: 2  - clobetasoL (TEMOVATE) 0.05 % ointment; Apply topically Two (2) times a day.  Dispense: 120 g; Refill: 1    High risk medication use: MTX, Adalimumab, Acitretin  - Quant Gold 5/21  - Labs 5/21 acceptable  - CBC; Future  - AST; Future  - ALT; Future  - Cholesterol, Total; Future  - Triglycerides; Future    The patient was advised to call for an appointment should any new, changing, or symptomatic lesions develop.     RTC: Return in about 7 weeks (around 06/18/2020). or sooner as needed   _________________________________________________________________    Chief Complaint     Chief Complaint   Patient presents with   ??? Psoriasis     f/u for hands, humira has seemed to help, salt water helped a lot at the beach     HPI     Michele Miller is a 59 y.o. female who presents as a returning patient (last seen 02/06/2020) to [facility] for follow up of hand dermatitis. She states that she is now on Humira, for about 6 weeks. Her last methotrexate injection was 04/27/20, as was her last Humira injection. She states that her insurance has finally approved her lightbox. She states that she went to the beach and the saltwater helped it. She is also taking acitretin and is tolerating the medicines well.     The patient denies any other new or changing lesions or areas of concern.     Pertinent Past Medical History     Problem List        Musculoskeletal and Integument    Psoriasis - Primary        Family History:   Unknown     Past Medical History, Family History, Social History, Medication List, Allergies, and Problem List were reviewed in the rooming section of Epic.     ROS: Other than symptoms mentioned in the HPI, no fevers, chills, or other skin complaints    Physical Examination     GENERAL: Well-appearing female in no acute distress, resting comfortably.  NEURO: Alert and oriented, answers questions appropriately  SKIN (Focal Skin Exam): Per patient request, examination of hands, digits bilaterally was performed  - Xerosis with thick scale on palmar aspect bilaterally- improved   - Pitting on all fingernails    All areas not commented on are within normal limits or unremarkable      (Approved Template 04/25/2020)

## 2020-04-30 ENCOUNTER — Ambulatory Visit
Admit: 2020-04-30 | Discharge: 2020-05-01 | Payer: MEDICARE | Attending: Student in an Organized Health Care Education/Training Program | Primary: Student in an Organized Health Care Education/Training Program

## 2020-04-30 DIAGNOSIS — L309 Dermatitis, unspecified: Principal | ICD-10-CM

## 2020-04-30 DIAGNOSIS — L409 Psoriasis, unspecified: Principal | ICD-10-CM

## 2020-04-30 DIAGNOSIS — Z79899 Other long term (current) drug therapy: Principal | ICD-10-CM

## 2020-04-30 MED ORDER — CLOBETASOL 0.05 % TOPICAL OINTMENT
Freq: Two times a day (BID) | TOPICAL | 1 refills | 0 days | Status: CP
Start: 2020-04-30 — End: 2021-04-30

## 2020-04-30 MED ORDER — ACITRETIN 25 MG CAPSULE
ORAL_CAPSULE | Freq: Every day | ORAL | 2 refills | 30.00000 days | Status: CP
Start: 2020-04-30 — End: ?

## 2020-04-30 NOTE — Unmapped (Addendum)
Today your resident physician was: Dr. Milda Smart    Continue clobetasol ointment daily to hands  Continue acitretin 25 mg daily  Continue Humira every other week   Continue methotrexate       [employer] at: [phone] or [phone]

## 2020-05-07 ENCOUNTER — Encounter: Payer: Self-pay | Admitting: Nurse Practitioner

## 2020-05-07 ENCOUNTER — Telehealth: Payer: Self-pay | Admitting: Nurse Practitioner

## 2020-05-07 ENCOUNTER — Ambulatory Visit (INDEPENDENT_AMBULATORY_CARE_PROVIDER_SITE_OTHER): Payer: Medicare Other | Admitting: Nurse Practitioner

## 2020-05-07 DIAGNOSIS — F17219 Nicotine dependence, cigarettes, with unspecified nicotine-induced disorders: Secondary | ICD-10-CM

## 2020-05-07 DIAGNOSIS — F4310 Post-traumatic stress disorder, unspecified: Secondary | ICD-10-CM

## 2020-05-07 DIAGNOSIS — L4050 Arthropathic psoriasis, unspecified: Secondary | ICD-10-CM | POA: Diagnosis not present

## 2020-05-07 DIAGNOSIS — I700 Atherosclerosis of aorta: Secondary | ICD-10-CM | POA: Diagnosis not present

## 2020-05-07 DIAGNOSIS — J410 Simple chronic bronchitis: Secondary | ICD-10-CM | POA: Diagnosis not present

## 2020-05-07 DIAGNOSIS — F332 Major depressive disorder, recurrent severe without psychotic features: Secondary | ICD-10-CM | POA: Diagnosis not present

## 2020-05-07 NOTE — Assessment & Plan Note (Signed)
Chronic, stable.  Continue current medication regimen and adjust as needed. Spirometry due, will obtain next visit.  Recommend complete cessation smoking.  Next CT due in September.  Return in 6 months.

## 2020-05-07 NOTE — Assessment & Plan Note (Signed)
Chronic, ongoing.  Continue current medication regimen and collaboration with psychiatry and therapist. Denies SI/HI.  She is aware all psychiatric medications are to be filled by her psychiatrist and she needs to ensure continued care with them. °

## 2020-05-07 NOTE — Assessment & Plan Note (Signed)
Chronic, ongoing.  Continue current medication regimen and collaboration with psychiatry + therapy sessions, which patient has reported benefit from.  Denies SI/HI.

## 2020-05-07 NOTE — Progress Notes (Signed)
There were no vitals taken for this visit.   Subjective:    Patient ID: Emily Johnston, female    DOB: Apr 22, 1961, 59 y.o.   MRN: [id number]  HPI: Emily Johnston is a 59 y.o. female  Chief Complaint  Patient presents with  . Depression  . COPD    . This visit was completed via MyChart due to the restrictions of the COVID-19 pandemic. All issues as above were discussed and addressed. Physical exam was done as above through visual confirmation on MyChart. If it was felt that the patient should be evaluated in the office, they were directed there. The patient verbally consented to this visit. . Location of the patient: home . Location of the provider: work . Those involved with this call:  . Provider: Marnee Guarneri, DNP . CMA: Yvonna Alanis, CMA . Front Desk/Registration: Don Perking  . Time spent on call: 20 minutes with patient face to face via video conference. More than 50% of this time was spent in counseling and coordination of care. 15 minutes total spent in review of patient's record and preparation of their chart.  . I verified patient identity using two factors (patient name and date of birth). Patient consents verbally to being seen via telemedicine visit today.    DEPRESSION Currently followed by psychiatry team, [facility] in [place]. Last seen a few weeks ago per patient report.  Continues on Cymbalta, Klonopin, and Celexa.  Is being followed by therapy too, which she reports benefit from.  She continues to be followed by [facility] providers for her psoriatic arthritis and was started on Humira recently. Mood status: stable Satisfied with current treatment?: yes Symptom severity: moderate Duration of current treatment : chronic Side effects: no Medication compliance: good compliance Psychotherapy/counseling: yes current Depressed mood: yes Anxious mood: yes Anhedonia: no Significant weight loss or gain: no Insomnia: yes hard to  stay asleep Fatigue: no Feelings of worthlessness or guilt: no Impaired concentration/indecisiveness: yes Suicidal ideations: no Hopelessness: no Crying spells: no Depression screen [facility] 2/9 05/07/2020 10/24/2019 07/13/2019 06/01/2019 03/22/2019  Decreased Interest 1 2 2 2 3   Down, Depressed, Hopeless 1 3 3 3 3   PHQ - 2 Score 2 5 5 5 6   Altered sleeping 0 1 2 3 3   Tired, decreased energy 1 1 3 2 3   Change in appetite 1 1 2 2 3   Feeling bad or failure about yourself  0 1 3 3 3   Trouble concentrating 1 1 3 3 3   Moving slowly or fidgety/restless 0 0 2 3 2   Suicidal thoughts 0 0 0 0 0  PHQ-9 Score 5 10 20 21 23   Difficult doing work/chores Not difficult at all Somewhat difficult - Somewhat difficult Somewhat difficult   COPD Has been smoking 3/4 PPD, has been smoking since age 59.  Continues on [employer].  Had lung CT CA screening 06/09/19, which was benign, but did note aortic atherosclerosis and emphysema. Is starting Chantix upcoming. COPD status: stable Satisfied with current treatment?: yes Oxygen use: no Dyspnea frequency: occasional Cough frequency: occasional Rescue inhaler frequency:  once at night time Limitation of activity: no Productive cough: currently Last Spirometry: 03/22/2019 Pneumovax: Up to Date Influenza: Up to Date   Relevant past medical, surgical, family and social history reviewed and updated as indicated. Interim medical history since our last visit reviewed. Allergies and medications reviewed and updated.  Review of Systems  Constitutional: Negative for activity change, appetite change, diaphoresis, fatigue and fever.  Respiratory: Negative  for cough, chest tightness and shortness of breath.   Cardiovascular: Negative for chest pain, palpitations and leg swelling.  Gastrointestinal: Negative.   Endocrine: Negative for cold intolerance and heat intolerance.  Neurological: Negative.   Psychiatric/Behavioral: Negative.     Per HPI unless specifically indicated  above     Objective:    There were no vitals taken for this visit.  Wt Readings from Last 3 Encounters:  04/09/20 132 lb (59.9 kg)  03/28/20 134 lb (60.8 kg)  09/06/19 152 lb (68.9 kg)    Physical Exam   Visit done via MyChart, but patient unable to get video up running, only audio.  Results for orders placed or performed in visit on 10/24/19  Comprehensive metabolic panel  Result Value Ref Range   Glucose 80 65 - 99 mg/dL   BUN 10 6 - 24 mg/dL   Creatinine, Ser 0.78 0.57 - 1.00 mg/dL   GFR calc non Af Amer 85 >59 mL/min/1.73   GFR calc Af Amer 98 >59 mL/min/1.73   BUN/Creatinine Ratio 13 9 - 23   Sodium 143 134 - 144 mmol/L   Potassium 4.4 3.5 - 5.2 mmol/L   Chloride 104 96 - 106 mmol/L   CO2 27 20 - 29 mmol/L   Calcium 9.6 8.7 - 10.2 mg/dL   Total Protein 6.4 6.0 - 8.5 g/dL   Albumin 4.0 3.8 - 4.9 g/dL   Globulin, Total 2.4 1.5 - 4.5 g/dL   Albumin/Globulin Ratio 1.7 1.2 - 2.2   Bilirubin Total 0.3 0.0 - 1.2 mg/dL   Alkaline Phosphatase 104 39 - 117 IU/L   AST 19 0 - 40 IU/L   ALT 12 0 - 32 IU/L  Lipid Panel w/o Chol/HDL Ratio  Result Value Ref Range   Cholesterol, Total 209 (H) 100 - 199 mg/dL   Triglycerides 97 0 - 149 mg/dL   HDL 45 >39 mg/dL   VLDL Cholesterol Cal 18 5 - 40 mg/dL   LDL Chol Calc (NIH) 146 (H) 0 - 99 mg/dL  VITAMIN D 25 Hydroxy (Vit-D Deficiency, Fractures)  Result Value Ref Range   Vit D, 25-Hydroxy 39.6 30.0 - 100.0 ng/mL  Vitamin B12  Result Value Ref Range   Vitamin B-12 916 232 - 1,245 pg/mL  TSH  Result Value Ref Range   TSH 0.873 0.450 - 4.500 uIU/mL  CBC with Differential/Platelet  Result Value Ref Range   WBC 6.1 3.4 - 10.8 x10E3/uL   RBC 4.39 3.77 - 5.28 x10E6/uL   Hemoglobin 14.1 11.1 - 15.9 g/dL   Hematocrit 42.6 34.0 - 46.6 %   MCV 97 79 - 97 fL   MCH 32.1 26.6 - 33.0 pg   MCHC 33.1 31 - 35 g/dL   RDW 13.3 11.7 - 15.4 %   Platelets 290 150 - 450 x10E3/uL   Neutrophils 53 Not Estab. %   Lymphs 39 Not Estab. %    Monocytes 6 Not Estab. %   Eos 1 Not Estab. %   Basos 1 Not Estab. %   Neutrophils Absolute 3.2 1 - 7 x10E3/uL   Lymphocytes Absolute 2.4 0 - 3 x10E3/uL   Monocytes Absolute 0.4 0 - 0 x10E3/uL   EOS (ABSOLUTE) 0.1 0.0 - 0.4 x10E3/uL   Basophils Absolute 0.1 0 - 0 x10E3/uL   Immature Granulocytes 0 Not Estab. %   Immature Grans (Abs) 0.0 0.0 - 0.1 x10E3/uL      Assessment & Plan:   Problem List Items Addressed This Visit  Cardiovascular and Mediastinum   Aortic atherosclerosis ([place])    Discussed at length with patient, have recommended complete cessation of smoking and educated patient on risk with smoking.  Consider addition of statin and ASA in future.        Respiratory   COPD (chronic obstructive pulmonary disease) (HCC)    Chronic, stable.  Continue current medication regimen and adjust as needed. Spirometry due, will obtain next visit.  Recommend complete cessation smoking.  Next CT due in September.  Return in 6 months.        Nervous and Auditory   Nicotine dependence, cigarettes, w unsp disorders    I have recommended complete cessation of tobacco use. I have discussed various options available for assistance with tobacco cessation including over the counter methods (Nicotine gum, patch and lozenges). We also discussed prescription options (Chantix, Nicotine Inhaler / Nasal Spray). The patient is not interested in pursuing any prescription tobacco cessation options at this time. Due for lung CT in September.  She is starting Chantix upcoming.         Musculoskeletal and Integument   Psoriatic arthritis ([place])    Continue collaboration with rheumatology & dermatology at [facility] and current medication regimen.  Reviewed recent notes.        Other   Severe recurrent major depression without psychotic features ([place]) - Primary    Chronic, ongoing.  Continue current medication regimen and collaboration with psychiatry and therapist. Denies SI/HI.  She is aware all  psychiatric medications are to be filled by her psychiatrist and she needs to ensure continued care with them.      PTSD (post-traumatic stress disorder)    Chronic, ongoing.  Continue current medication regimen and collaboration with psychiatry + therapy sessions, which patient has reported benefit from.  Denies SI/HI.         I discussed the assessment and treatment plan with the patient. The patient was provided an opportunity to ask questions and all were answered. The patient agreed with the plan and demonstrated an understanding of the instructions.   The patient was advised to call back or seek an in-person evaluation if the symptoms worsen or if the condition fails to improve as anticipated.   I provided 21+ minutes of time during this encounter.  Follow up plan: Return in about 6 months (around 11/07/2020) for Annual physical with spirometry.

## 2020-05-07 NOTE — Patient Instructions (Signed)
Chronic Obstructive Pulmonary Disease Chronic obstructive pulmonary disease (COPD) is a long-term (chronic) lung problem. When you have COPD, it is hard for air to get in and out of your lungs. Usually the condition gets worse over time, and your lungs will never return to normal. There are things you can do to keep yourself as healthy as possible.  Your doctor may treat your condition with: ? Medicines. ? Oxygen. ? Lung surgery.  Your doctor may also recommend: ? Rehabilitation. This includes steps to make your body work better. It may involve a team of specialists. ? Quitting smoking, if you smoke. ? Exercise and changes to your diet. ? Comfort measures (palliative care). Follow these instructions at home: Medicines  Take over-the-counter and prescription medicines only as told by your doctor.  Talk to your doctor before taking any cough or allergy medicines. You may need to avoid medicines that cause your lungs to be dry. Lifestyle  If you smoke, stop. Smoking makes the problem worse. If you need help quitting, ask your doctor.  Avoid being around things that make your breathing worse. This may include smoke, chemicals, and fumes.  Stay active, but remember to rest as well.  Learn and use tips on how to relax.  Make sure you get enough sleep. Most adults need at least 7 hours of sleep every night.  Eat healthy foods. Eat smaller meals more often. Rest before meals. Controlled breathing Learn and use tips on how to control your breathing as told by your doctor. Try:  Breathing in (inhaling) through your nose for 1 second. Then, pucker your lips and breath out (exhale) through your lips for 2 seconds.  Putting one hand on your belly (abdomen). Breathe in slowly through your nose for 1 second. Your hand on your belly should move out. Pucker your lips and breathe out slowly through your lips. Your hand on your belly should move in as you breathe out.  Controlled coughing Learn  and use controlled coughing to clear mucus from your lungs. Follow these steps: 1. Lean your head a little forward. 2. Breathe in deeply. 3. Try to hold your breath for 3 seconds. 4. Keep your mouth slightly open while coughing 2 times. 5. Spit any mucus out into a tissue. 6. Rest and do the steps again 1 or 2 times as needed. General instructions  Make sure you get all the shots (vaccines) that your doctor recommends. Ask your doctor about a flu shot and a pneumonia shot.  Use oxygen therapy and pulmonary rehabilitation if told by your doctor. If you need home oxygen therapy, ask your doctor if you should buy a tool to measure your oxygen level (oximeter).  Make a COPD action plan with your doctor. This helps you to know what to do if you feel worse than usual.  Manage any other conditions you have as told by your doctor.  Avoid going outside when it is very hot, cold, or humid.  Avoid people who have a sickness you can catch (contagious).  Keep all follow-up visits as told by your doctor. This is important. Contact a doctor if:  You cough up more mucus than usual.  There is a change in the color or thickness of the mucus.  It is harder to breathe than usual.  Your breathing is faster than usual.  You have trouble sleeping.  You need to use your medicines more often than usual.  You have trouble doing your normal activities such as getting dressed   or walking around the house. Get help right away if:  You have shortness of breath while resting.  You have shortness of breath that stops you from: ? Being able to talk. ? Doing normal activities.  Your chest hurts for longer than 5 minutes.  Your skin color is more blue than usual.  Your pulse oximeter shows that you have low oxygen for longer than 5 minutes.  You have a fever.  You feel too tired to breathe normally. Summary  Chronic obstructive pulmonary disease (COPD) is a long-term lung problem.  The way your  lungs work will never return to normal. Usually the condition gets worse over time. There are things you can do to keep yourself as healthy as possible.  Take over-the-counter and prescription medicines only as told by your doctor.  If you smoke, stop. Smoking makes the problem worse. This information is not intended to replace advice given to you by your health care provider. Make sure you discuss any questions you have with your health care provider. Document Revised: 08/14/2017 Document Reviewed: 10/06/2016 Elsevier Patient Education  2020 Elsevier Inc.  

## 2020-05-07 NOTE — Telephone Encounter (Signed)
-----   Message from Venita Lick, NP sent at 05/07/2020  3:15 PM EDT ----- 6 month annual physical need

## 2020-05-07 NOTE — Assessment & Plan Note (Signed)
Continue collaboration with rheumatology & dermatology at [facility] and current medication regimen.  Reviewed recent notes. 

## 2020-05-07 NOTE — Assessment & Plan Note (Signed)
I have recommended complete cessation of tobacco use. I have discussed various options available for assistance with tobacco cessation including over the counter methods (Nicotine gum, patch and lozenges). We also discussed prescription options (Chantix, Nicotine Inhaler / Nasal Spray). The patient is not interested in pursuing any prescription tobacco cessation options at this time. Due for lung CT in September.  She is starting Chantix upcoming.

## 2020-05-07 NOTE — Assessment & Plan Note (Signed)
Discussed at length with patient, have recommended complete cessation of smoking and educated patient on risk with smoking.  Consider addition of statin and ASA in future. 

## 2020-05-09 LAB — CBC
HEMATOCRIT: 39.4 % (ref 34.0–46.6)
HEMOGLOBIN: 13.2 g/dL (ref 11.1–15.9)
MEAN CORPUSCULAR HEMOGLOBIN CONC: 33.5 g/dL (ref 31.5–35.7)
MEAN CORPUSCULAR HEMOGLOBIN: 33.3 pg — ABNORMAL HIGH (ref 26.6–33.0)
MEAN CORPUSCULAR VOLUME: 100 fL — ABNORMAL HIGH (ref 79–97)
PLATELET COUNT: 306 10*3/uL (ref 150–450)
RED BLOOD CELL COUNT: 3.96 x10E6/uL (ref 3.77–5.28)
RED CELL DISTRIBUTION WIDTH: 14.2 % (ref 11.7–15.4)
WHITE BLOOD CELL COUNT: 6.6 10*3/uL (ref 3.4–10.8)

## 2020-05-09 LAB — TRIGLYCERIDES: TRIGLYCERIDES: 106 mg/dL (ref 0–149)

## 2020-05-09 LAB — CHOLESTEROL, TOTAL: CHOLESTEROL, TOTAL: 189 mg/dL (ref 100–199)

## 2020-05-09 LAB — ALT: ALT (SGPT): 25 IU/L (ref 0–32)

## 2020-05-09 LAB — AST: AST (SGOT): 28 IU/L (ref 0–40)

## 2020-05-09 NOTE — Unmapped (Signed)
Lab results reviewed in MyChart. All are unremarkable. No change to current. Patient updated in MyChart. Safe to continue acetretin.

## 2020-05-09 NOTE — Telephone Encounter (Signed)
Lvm to make this apt. 

## 2020-05-11 ENCOUNTER — Other Ambulatory Visit: Payer: Self-pay

## 2020-05-11 ENCOUNTER — Ambulatory Visit
Admission: EM | Admit: 2020-05-11 | Discharge: 2020-05-11 | Disposition: A | Discharge status: Home / Self Care | Payer: Medicare Other | Attending: Physician Assistant | Admitting: Physician Assistant

## 2020-05-11 DIAGNOSIS — Z20822 Contact with and (suspected) exposure to covid-19: Secondary | ICD-10-CM | POA: Insufficient documentation

## 2020-05-11 DIAGNOSIS — M1990 Unspecified osteoarthritis, unspecified site: Secondary | ICD-10-CM | POA: Insufficient documentation

## 2020-05-11 DIAGNOSIS — I700 Atherosclerosis of aorta: Secondary | ICD-10-CM | POA: Diagnosis not present

## 2020-05-11 DIAGNOSIS — K581 Irritable bowel syndrome with constipation: Secondary | ICD-10-CM | POA: Insufficient documentation

## 2020-05-11 DIAGNOSIS — R059 Cough, unspecified: Secondary | ICD-10-CM

## 2020-05-11 DIAGNOSIS — J449 Chronic obstructive pulmonary disease, unspecified: Secondary | ICD-10-CM | POA: Insufficient documentation

## 2020-05-11 DIAGNOSIS — L4050 Arthropathic psoriasis, unspecified: Secondary | ICD-10-CM | POA: Insufficient documentation

## 2020-05-11 DIAGNOSIS — F419 Anxiety disorder, unspecified: Secondary | ICD-10-CM | POA: Diagnosis not present

## 2020-05-11 DIAGNOSIS — M797 Fibromyalgia: Secondary | ICD-10-CM | POA: Diagnosis not present

## 2020-05-11 DIAGNOSIS — F17210 Nicotine dependence, cigarettes, uncomplicated: Secondary | ICD-10-CM | POA: Diagnosis not present

## 2020-05-11 DIAGNOSIS — R0981 Nasal congestion: Secondary | ICD-10-CM | POA: Diagnosis not present

## 2020-05-11 DIAGNOSIS — F329 Major depressive disorder, single episode, unspecified: Secondary | ICD-10-CM | POA: Insufficient documentation

## 2020-05-11 DIAGNOSIS — Z79899 Other long term (current) drug therapy: Secondary | ICD-10-CM | POA: Insufficient documentation

## 2020-05-11 DIAGNOSIS — F4310 Post-traumatic stress disorder, unspecified: Secondary | ICD-10-CM | POA: Diagnosis not present

## 2020-05-11 DIAGNOSIS — R05 Cough: Secondary | ICD-10-CM

## 2020-05-11 DIAGNOSIS — R5383 Other fatigue: Secondary | ICD-10-CM

## 2020-05-11 DIAGNOSIS — M4722 Other spondylosis with radiculopathy, cervical region: Secondary | ICD-10-CM | POA: Insufficient documentation

## 2020-05-11 DIAGNOSIS — M4712 Other spondylosis with myelopathy, cervical region: Secondary | ICD-10-CM | POA: Diagnosis not present

## 2020-05-11 DIAGNOSIS — B349 Viral infection, unspecified: Secondary | ICD-10-CM | POA: Diagnosis present

## 2020-05-11 NOTE — Discharge Instructions (Addendum)

## 2020-05-11 NOTE — ED Provider Notes (Signed)
[facility]    CSN: [id number] Arrival date & time: 05/11/20  1240      History   Chief Complaint Chief Complaint  Patient presents with  . Cough  . Sore Throat  . Otalgia    Bilateral  . Fatigue  . Nausea    HPI Emily Johnston is a 59 y.o. female.   59 year old female presents for 3 to 4-day history of fatigue, sore throat, sinus congestion, ear pressure, dry cough, and nausea.  She says she has also had a fever, highest reported temperature is 99.6.  Patient denies known Covid exposure but is concerned about Covid.  Patient is not vaccinated for Covid.  Patient has history of COPD and arthritis. She is on Humira.  Patient has been taking over-the-counter decongestants and using nasal sprays without relief.  She believes she may have a sinus infection and request antibiotic.  She denies any significant weakness. Patient denies chest pain or shortness of breath. No other concerns today     Past Medical History:  Diagnosis Date  . Anxiety   . Arthritis   . COPD (chronic obstructive pulmonary disease) ([place])   . Depression   . Fibromyalgia   . IBS (irritable bowel syndrome)   . PTSD (post-traumatic stress disorder)     Patient Active Problem List   Diagnosis Date Noted  . Spondylosis of cervical spine with myelopathy and radiculopathy 10/24/2019  . Aortic atherosclerosis ([place]) 06/15/2019  . Psoriatic arthritis ([place]) 03/22/2019  . COPD (chronic obstructive pulmonary disease) ([place]) 02/22/2019  . Nicotine dependence, cigarettes, w unsp disorders 02/22/2019  . Chronic idiopathic constipation 11/01/2018  . Severe recurrent major depression without psychotic features ([place]) 01/12/2018  . PTSD (post-traumatic stress disorder) 01/12/2018  . Fibromyalgia 01/12/2018    Past Surgical History:  Procedure Laterality Date  . ABDOMINAL HYSTERECTOMY    . CARPAL TUNNEL RELEASE    . KNEE SURGERY      OB History   No obstetric history on file.      Home  Medications    Prior to Admission medications   Medication Sig Start Date End Date Taking? Authorizing Provider  acitretin (SORIATANE) 10 MG capsule Take 10 mg by mouth daily. 10/14/19  Yes [provider]  Adalimumab (HUMIRA PEN) 40 MG/0.4ML PNKT Inject into the skin. 02/10/20 02/09/21 Yes [provider]  Adalimumab 80 MG/0.8ML & 40MG /0.4ML PSKT Inject the contents of 1 pen (80mg ) under the skin once for initial dose, then inject the contents of 1 pen (40mg ) every other week beginning 1 week after initial dose. 02/10/20  Yes [provider]  albuterol (PROVENTIL HFA;VENTOLIN HFA) 108 (90 Base) MCG/ACT inhaler Inhale 2 puffs into the lungs every 6 (six) hours as needed for wheezing or shortness of breath.   Yes [provider]  citalopram (CELEXA) 20 MG tablet Take 20 mg by mouth daily.   Yes [provider]  clonazePAM (KLONOPIN) 1 MG tablet Take 1 mg by mouth as needed for anxiety. 3 times daily as needed   Yes [provider]  fluticasone (FLONASE) 50 MCG/ACT nasal spray Place 2 sprays into both nostrils daily.   Yes [provider]  folic acid (FOLVITE) 1 MG tablet Take 1 mg by mouth daily.   Yes [provider]  gabapentin (NEURONTIN) 300 MG capsule Take 300 mg by mouth 3 (three) times daily.   Yes [provider]  loratadine (CLARITIN) 10 MG tablet Take 1 tablet (10 mg total) by mouth  daily. 11/11/19  Yes Cannady, [name] T, NP  meclizine (ANTIVERT) 12.5 MG tablet Take 1 tablet (12.5 mg total) by mouth 3 (three) times daily as needed for dizziness. 10/24/19  Yes Cannady, Henrine Screws T, NP  methotrexate 250 MG/10ML injection Inject 25 mg into the vein once a week.   Yes [provider]  Multiple Vitamin (MULTIVITAMIN) tablet Take 1 tablet by mouth daily.   Yes [provider]  senna (SENOKOT) 8.6 MG tablet Take 1 tablet by mouth as needed.   Yes [provider]  Tiotropium Bromide-Olodaterol  (STIOLTO RESPIMAT) 2.5-2.5 MCG/ACT AERS Inhale 2 puffs into the lungs daily. 03/21/20  Yes Cannady, [name] T, NP  traZODone (DESYREL) 50 MG tablet Take 2 tablets by mouth daily.   Yes [provider]  DULoxetine (CYMBALTA) 60 MG capsule Take 2 capsules by mouth daily. 10/25/18 10/25/19  [provider]    Family History History reviewed. No pertinent family history.  Social History Social History   Tobacco Use  . Smoking status: Current Every Day Smoker    Packs/day: 1.00    Years: 41.00    Pack years: 41.00    Types: Cigarettes  . Smokeless tobacco: Never Used  Vaping Use  . Vaping Use: Never used  Substance Use Topics  . Alcohol use: No  . Drug use: No     Allergies   Patient has no known allergies.   Review of Systems Review of Systems  Constitutional: Positive for fatigue and fever. Negative for chills and diaphoresis.  HENT: Positive for congestion, sinus pressure and sore throat. Negative for ear pain, rhinorrhea and sinus pain.   Respiratory: Positive for cough. Negative for chest tightness, shortness of breath and wheezing.   Cardiovascular: Negative for chest pain.  Gastrointestinal: Positive for nausea. Negative for abdominal pain, diarrhea and vomiting.  Musculoskeletal: Positive for myalgias. Negative for arthralgias and back pain.  Skin: Negative for rash.  Neurological: Negative for weakness and headaches.  Hematological: Negative for adenopathy.     Physical Exam Triage Vital Signs ED Triage Vitals  Enc Vitals Group     BP 05/11/20 1334 119/85     Pulse Rate 05/11/20 1334 74     Resp 05/11/20 1334 18     Temp 05/11/20 1334 98.1 F (36.7 C)     Temp Source 05/11/20 1334 Oral     SpO2 05/11/20 1334 97 %     Weight 05/11/20 1335 129 lb (58.5 kg)     Height 05/11/20 1335 5\' 4"  (1.626 m)     Head Circumference --      Peak Flow --      Pain Score 05/11/20 1335 0     Pain Loc --      Pain Edu? --      Excl. in [place]? --    No data  found.  Updated Vital Signs BP 119/85 (BP Location: Right Arm)   Pulse 74   Temp 98.1 F (36.7 C) (Oral)   Resp 18   Ht 5\' 4"  (1.626 m)   Wt 129 lb (58.5 kg)   SpO2 97%   BMI 22.14 kg/m      Physical Exam Vitals and nursing note reviewed.  Constitutional:      General: She is not in acute distress.    Appearance: Normal appearance. She is normal weight. She is not ill-appearing or toxic-appearing.  HENT:     Head: Normocephalic and atraumatic.     Right Ear: Hearing normal. A  middle ear effusion is present. Tympanic membrane is not injected, erythematous or bulging.     Left Ear: Hearing normal. A middle ear effusion is present. Tympanic membrane is not injected, erythematous or bulging.     Ears:     Comments: Cloudy TMs    Nose: Congestion present.     Comments: No sinus tenderness    Mouth/Throat:     Mouth: Mucous membranes are moist.     Pharynx: Oropharynx is clear. Posterior oropharyngeal erythema (with clear PND.) present.  Eyes:     General: No scleral icterus.       Right eye: No discharge.        Left eye: No discharge.     Conjunctiva/sclera: Conjunctivae normal.  Cardiovascular:     Rate and Rhythm: Normal rate and regular rhythm.     Heart sounds: Normal heart sounds.  Pulmonary:     Effort: Pulmonary effort is normal. No respiratory distress.     Breath sounds: Normal breath sounds. No wheezing, rhonchi or rales.  Musculoskeletal:     Cervical back: Neck supple.  Skin:    General: Skin is dry.  Neurological:     General: No focal deficit present.     Mental Status: She is alert. Mental status is at baseline.     Motor: No weakness.     Gait: Gait normal.  Psychiatric:        Mood and Affect: Mood normal.        Behavior: Behavior normal.        Thought Content: Thought content normal.      UC Treatments / Results  Labs (all labs ordered are listed, but only abnormal results are displayed) Labs Reviewed  SARS CORONAVIRUS 2 (TAT 6-24 HRS)      EKG   Radiology No results found.  Procedures Procedures (including critical care time)  Medications Ordered in UC Medications - No data to display  Initial Impression / Assessment and Plan / UC Course  I have reviewed the triage vital signs and the nursing notes.  Pertinent labs & imaging results that were available during my care of the patient were reviewed by me and considered in my medical decision making (see chart for details).    59 year old female presenting today for concerns about possible COVID-19 infection as well as possible sinus infection.  VSS and chest CTA. Suspect viral URI vs COVID.  I do not suspect bacterial sinus infection or COPD exacerbation based on exam and vitals. COVID testing obtained. Isolate until results and follow CDC guidelines if positive. Patient well appearing and suitable for at home care. Advised continuing OTC cough meds, rest, fluids.  She declined cough medication.  Follow up as needed.  I advised patient that if she test positive for Covid she is a good candidate for monoclonal antibody therapy and provided her with a contact number.  She says she already has their contact number because they have contacted her before.  Patient does request antibiotic, but I do not feel her exam warrants that today.  I advised her to follow-up with her PCP if not feeling better after a week.  Follow up sooner for weakness, fever greater than 100.6, chest pain or shortness of breath.  ED precautions discussed.  Final Clinical Impressions(s) / UC Diagnoses   Final diagnoses:  Viral illness  Sinus congestion  Fatigue, unspecified type     Discharge Instructions     URI/COLD SYMPTOMS: Your exam today is consistent with  a viral illness. Antibiotics are not indicated at this time. Use medications as directed, including cough syrup, nasal saline, and decongestants. Your symptoms should improve over the next few days and resolve within 7-10 days. Increase rest  and fluids. F/u if symptoms worsen or predominate such as sore throat, ear pain, productive cough, shortness of breath, or if you develop high fevers or worsening fatigue over the next several days.    You have received COVID testing today either for positive exposure, concerning symptoms that could be related to COVID infection, screening purposes, or re-testing after confirmed positive.  Your test obtained today checks for active viral infection in the last 1-2 weeks. If your test is negative now, you can still test positive later. So, if you do develop symptoms you should either get re-tested and/or isolate x 10 days. Please follow CDC guidelines.  While Rapid antigen tests come back in 15-20 minutes, send out PCR/molecular test results typically come back within 24 hours. In the mean time, if you are symptomatic, assume this could be a positive test and treat/monitor yourself as if you do have COVID.   We will call with test results. Please download the MyChart app and set up a profile to access test results.   If symptomatic, go home and rest. Push fluids. Take Tylenol as needed for discomfort. Gargle warm salt water. Throat lozenges. Take Mucinex DM or Robitussin for cough. Humidifier in bedroom to ease coughing. Warm showers. Also review the COVID handout for more information.  COVID-19 INFECTION: The incubation period of COVID-19 is approximately 14 days after exposure, with most symptoms developing in roughly 4-5 days. Symptoms may range in severity from mild to critically severe. Roughly 80% of those infected will have mild symptoms. People of any age may become infected with COVID-19 and have the ability to transmit the virus. The most common symptoms include: fever, fatigue, cough, body aches, headaches, sore throat, nasal congestion, shortness of breath, nausea, vomiting, diarrhea, changes in smell and/or taste.    COURSE OF ILLNESS Some patients may begin with mild disease which can  progress quickly into critical symptoms. If your symptoms are worsening please call ahead to the Emergency Department and proceed there for further treatment. Recovery time appears to be roughly 1-2 weeks for mild symptoms and 3-6 weeks for severe disease.   GO IMMEDIATELY TO ER FOR FEVER YOU ARE UNABLE TO GET DOWN WITH TYLENOL, BREATHING PROBLEMS, CHEST PAIN, FATIGUE, LETHARGY, INABILITY TO EAT OR DRINK, ETC  QUARANTINE AND ISOLATION: To help decrease the spread of COVID-19 please remain isolated if you have COVID infection or are highly suspected to have COVID infection. This means -stay home and isolate to one room in the home if you live with others. Do not share a bed or bathroom with others while ill, sanitize and wipe down all countertops and keep common areas clean and disinfected. You may discontinue isolation if you have a mild case and are asymptomatic 10 days after symptom onset as long as you have been fever free >24 hours without having to take Motrin or Tylenol. If your case is more severe (meaning you develop pneumonia or are admitted in the hospital), you may have to isolate longer.   If you have been in close contact (within 6 feet) of someone diagnosed with COVID 19, you are advised to quarantine in your home for 14 days as symptoms can develop anywhere from 2-14 days after exposure to the virus. If you develop symptoms, you  must isolate.  Most current guidelines for COVID after exposure -isolate 10 days if you ARE NOT tested for COVID as long as symptoms do not develop -isolate 7 days if you are tested and remain asymptomatic -You do not necessarily need to be tested for COVID if you have + exposure and        develop   symptoms. Just isolate at home x10 days from symptom onset During this global pandemic, CDC advises to practice social distancing, try to stay at least 6ft away from others at all times. Wear a face covering. Wash and sanitize your hands regularly and avoid going  anywhere that is not necessary.  KEEP IN MIND THAT THE COVID TEST IS NOT 100% ACCURATE AND YOU SHOULD STILL DO EVERYTHING TO PREVENT POTENTIAL SPREAD OF VIRUS TO OTHERS (WEAR MASK, WEAR GLOVES, [place] HANDS AND SANITIZE REGULARLY). IF INITIAL TEST IS NEGATIVE, THIS MAY NOT MEAN YOU ARE DEFINITELY NEGATIVE. MOST ACCURATE TESTING IS DONE 5-7 DAYS AFTER EXPOSURE.   It is not advised by CDC to get re-tested after receiving a positive COVID test since you can still test positive for weeks to months after you have already cleared the virus.   *If you have not been vaccinated for COVID, I strongly suggest you consider getting vaccinated as long as there are no contraindications.    IF YOU END UP TESTING POSITIVE FOR COVID, CALL [phone] TO BE CONSIDERED FOR MONOCLONAL ANTIBODY THERAPY    ED Prescriptions    None     PDMP not reviewed this encounter.   Danton Clap, PA-C 05/11/20 1432

## 2020-05-11 NOTE — ED Triage Notes (Signed)
Patient in today w/ c/o cough, fatigue, sore throat, congestion, nausea, and fever. Onset of sx was Tuesday. Patient denies loss of taste and/or smell.

## 2020-05-12 LAB — SARS CORONAVIRUS 2 (TAT 6-24 HRS): SARS Coronavirus 2: NEGATIVE

## 2020-05-14 ENCOUNTER — Encounter: Payer: Self-pay | Admitting: Nurse Practitioner

## 2020-05-14 ENCOUNTER — Other Ambulatory Visit: Payer: Self-pay

## 2020-05-14 ENCOUNTER — Telehealth (INDEPENDENT_AMBULATORY_CARE_PROVIDER_SITE_OTHER): Payer: Medicare Other | Admitting: Nurse Practitioner

## 2020-05-14 ENCOUNTER — Telehealth: Payer: Self-pay | Admitting: Nurse Practitioner

## 2020-05-14 DIAGNOSIS — J0110 Acute frontal sinusitis, unspecified: Secondary | ICD-10-CM | POA: Insufficient documentation

## 2020-05-14 DIAGNOSIS — J321 Chronic frontal sinusitis: Secondary | ICD-10-CM | POA: Insufficient documentation

## 2020-05-14 DIAGNOSIS — J329 Chronic sinusitis, unspecified: Secondary | ICD-10-CM | POA: Insufficient documentation

## 2020-05-14 MED ORDER — AZITHROMYCIN 250 MG PO TABS
ORAL_TABLET | ORAL | 0 refills | Status: DC
Start: 2020-05-14 — End: 2020-05-22

## 2020-05-14 MED ORDER — AZITHROMYCIN 250 MG PO TABS
ORAL_TABLET | ORAL | 0 refills | Status: DC
Start: 2020-05-14 — End: 2020-05-14

## 2020-05-14 MED ORDER — AMOXICILLIN-POT CLAVULANATE 875-125 MG PO TABS: 1.0000 | ORAL_TABLET | Freq: Two times a day (BID) | ORAL | 0 refills | Status: DC

## 2020-05-14 NOTE — Telephone Encounter (Signed)
Noted  

## 2020-05-14 NOTE — Telephone Encounter (Signed)
Called pt to schedule no answer, vm not available will try again later

## 2020-05-14 NOTE — Telephone Encounter (Signed)
Noted on for virtual

## 2020-05-14 NOTE — Progress Notes (Signed)
There were no vitals taken for this visit.   Subjective:    Patient ID: Emily Johnston, female    DOB: 08/15/1961, 59 y.o.   MRN: [id number]  HPI: Emily Johnston is a 59 y.o. female  Chief Complaint  Patient presents with  . URI    pt states she has been having a cough, congestion, sinus pressure, headache, slight fever, fatigued. States symptoms started last Tuesday, went to [facility] Friday and tested negative for COVID, was not given any antibiotics.     . This visit was completed via telephone due to the restrictions of the COVID-19 pandemic. All issues as above were discussed and addressed but no physical exam was performed. If it was felt that the patient should be evaluated in the office, they were directed there. The patient verbally consented to this visit. Patient was unable to complete an audio/visual visit due to Technical difficulties,Lack of internet. Due to the catastrophic nature of the COVID-19 pandemic, this visit was done through audio contact only. . Location of the patient: home . Location of the provider: work . Those involved with this call:  . Provider: Marnee Guarneri, DNP . CMA: Yvonna Alanis, CMA . Front Desk/Registration: Don Perking  . Time spent on call: 20 minutes on the phone discussing health concerns. 15 minutes total spent in review of patient's record and preparation of their chart.  . I verified patient identity using two factors (patient name and date of birth). Patient consents verbally to being seen via telemedicine visit today.    UPPER RESPIRATORY TRACT INFECTION Started feeling sick on 05/08/20.  Went to urgent care on 05/11/20 and tested negative for Covid, was given strict follow-up instructions and encouraged to reach out to PCP if not improving to obtain abx.  She takes Humira for her arthritis.  Not currently vaccinated against Covid.  Denies loss of taste or smell. Fever: yes Cough: yes Shortness of breath:  no Wheezing: no Chest pain: no Chest tightness: no Chest congestion: no Nasal congestion: yes Runny nose: yes Post nasal drip: yes Sneezing: yes Sore throat: yes Swollen glands: no Sinus pressure: yes Headache: yes Face pain: yes Toothache: no Ear pain: yes bilateral Ear pressure: yes bilateral Eyes red/itching:no Eye drainage/crusting: no  Vomiting: nausea at times with pressure Rash: no Fatigue: yes Sick contacts: no Strep contacts: no  Context: stable Recurrent sinusitis: no Relief with OTC cold/cough medications: no  Treatments attempted: cold/sinus   Relevant past medical, surgical, family and social history reviewed and updated as indicated. Interim medical history since our last visit reviewed. Allergies and medications reviewed and updated.  Review of Systems  Constitutional: Positive for fatigue and fever. Negative for activity change and appetite change.  HENT: Positive for congestion, postnasal drip, rhinorrhea, sinus pressure, sinus pain and sore throat. Negative for ear discharge, ear pain, facial swelling, sneezing and voice change.   Eyes: Negative for pain and visual disturbance.  Respiratory: Negative for cough, chest tightness, shortness of breath and wheezing.   Cardiovascular: Negative for chest pain, palpitations and leg swelling.  Gastrointestinal: Positive for nausea. Negative for abdominal distention, abdominal pain, constipation, diarrhea and vomiting.  Endocrine: Negative.   Musculoskeletal: Positive for myalgias.  Neurological: Positive for headaches. Negative for dizziness and numbness.  Psychiatric/Behavioral: Negative.     Per HPI unless specifically indicated above     Objective:    There were no vitals taken for this visit.  Wt Readings from Last 3 Encounters:  05/11/20 129 lb (  58.5 kg)  04/09/20 132 lb (59.9 kg)  03/28/20 134 lb (60.8 kg)    Physical Exam   Unable to perform due to telephone visit only.  Reports pain on  palpation frontal sinuses.  Results for orders placed or performed during the hospital encounter of 05/11/20  SARS CORONAVIRUS 2 (TAT 6-24 HRS) Nasopharyngeal Nasopharyngeal Swab   Specimen: Nasopharyngeal Swab  Result Value Ref Range   SARS Coronavirus 2 NEGATIVE NEGATIVE      Assessment & Plan:   Problem List Items Addressed This Visit      Respiratory   Sinusitis    Acute with recent negative Covid testing at [facility] and symptoms going on for one week.  At this time will send in script for [place] which has least interaction with other medications she is taking.  Recommend she continue to utilize OTC cold medications for symptom relief + Tylenol as needed for fever.  Ensure plenty of rest and hydration at home + highly recommend she self quarantine until symptoms have improved.  Discussed with patient that if symptoms ongoing highly recommend she retest for Covid and highly recommend she obtain vaccine once feeling better.  Return to office for ongoing or worsening symptoms.      Relevant Medications   azithromycin (ZITHROMAX) 250 MG tablet      I discussed the assessment and treatment plan with the patient. The patient was provided an opportunity to ask questions and all were answered. The patient agreed with the plan and demonstrated an understanding of the instructions.   The patient was advised to call back or seek an in-person evaluation if the symptoms worsen or if the condition fails to improve as anticipated.   I provided 21+ minutes of time during this encounter.  Follow up plan: Return if symptoms worsen or fail to improve.

## 2020-05-14 NOTE — Assessment & Plan Note (Signed)
Acute with recent negative Covid testing at [facility] and symptoms going on for one week.  At this time will send in script for [place] which has least interaction with other medications she is taking.  Recommend she continue to utilize OTC cold medications for symptom relief + Tylenol as needed for fever.  Ensure plenty of rest and hydration at home + highly recommend she self quarantine until symptoms have improved.  Discussed with patient that if symptoms ongoing highly recommend she retest for Covid and highly recommend she obtain vaccine once feeling better.  Return to office for ongoing or worsening symptoms.

## 2020-05-14 NOTE — Telephone Encounter (Signed)
  Pt was seen virtual 05/07/20 please advise.  Copied from [place] [id number]. Topic: General - Other >> May 14, 2020  9:13 AM Hinda Lenis D wrote: PT requesting some antibiotics possible sinus infection / please advise

## 2020-05-14 NOTE — Patient Instructions (Signed)

## 2020-05-14 NOTE — Telephone Encounter (Signed)
Will need visit please.

## 2020-05-14 NOTE — Telephone Encounter (Signed)
Called pt back she is scheduled for 05/16/20 however she states that she went to UC in [place] on Friday and she states they mentioned that her ears had fluid in them but she wasn't prescribed anything. 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 She is wanting to have something sent in or to be seen earlier than 05/16/20. Please advise.

## 2020-05-16 ENCOUNTER — Telehealth: Payer: Medicare Other | Admitting: Nurse Practitioner

## 2020-05-22 ENCOUNTER — Encounter: Payer: Self-pay | Admitting: Family Medicine

## 2020-05-22 ENCOUNTER — Telehealth (INDEPENDENT_AMBULATORY_CARE_PROVIDER_SITE_OTHER): Payer: Medicare Other | Admitting: Family Medicine

## 2020-05-22 ENCOUNTER — Other Ambulatory Visit: Payer: Self-pay

## 2020-05-22 DIAGNOSIS — J069 Acute upper respiratory infection, unspecified: Secondary | ICD-10-CM

## 2020-05-22 MED ORDER — PREDNISONE 50 MG PO TABS: 50.0000 mg | ORAL_TABLET | Freq: Every day | ORAL | 0 refills | Status: DC

## 2020-05-22 NOTE — Unmapped (Signed)
[facility] Refill Coordination Note    Specialty Medication(s) to be Shipped:   Inflammatory Disorders: Humira and methotrexate (injectable)    Other medication(s) to be shipped: flonase, bd tuberculin syringes      Michele Miller, DOB: March 08, 1961  Phone: [phone] (home)       All above HIPAA information was verified with patient.     Was a Nurse, learning disability used for this call? No    Completed refill call assessment today to schedule patient's medication shipment from the [facility] [phone]).       Specialty medication(s) and dose(s) confirmed: Regimen is correct and unchanged.   Changes to medications: Michele Miller reports no changes at this time.  Changes to insurance: No  Questions for the pharmacist: No    Confirmed patient received Welcome Packet with first shipment. The patient will receive a drug information handout for each medication shipped and additional FDA Medication Guides as required.       DISEASE/MEDICATION-SPECIFIC INFORMATION        For patients on injectable medications: Patient currently has 0 doses left.  Next injection is scheduled for 05/25/20 for both Humira and Methotrexate.    SPECIALTY MEDICATION ADHERENCE     Medication Adherence    Patient reported X missed doses in the last month: 0  Specialty Medication: Humira 40mg /0.42ml  Patient is on additional specialty medications: Yes  Additional Specialty Medications: Methotrexate 25mg /ml  Patient Reported Additional Medication X Missed Doses in the Last Month: 0  Patient is on more than two specialty medications: No  Informant: patient                    SHIPPING     Shipping address confirmed in Epic.     Delivery Scheduled: Yes, Expected medication delivery date: 05/24/20.     Medication will be delivered via Same Day Courier to the prescription address in Epic WAM.    Michele Miller   [facility] Specialty Technician

## 2020-05-22 NOTE — Progress Notes (Signed)
There were no vitals taken for this visit.   Subjective:    Patient ID: Emily Johnston, female    DOB: Sep 29, 1960, 59 y.o.   MRN: [id number]  HPI: Emily Johnston is a 58 y.o. female  Chief Complaint  Patient presents with  . Fever    pt states she is not feeling much better from previous visit. States she finished all antibiotics and has been haviong off and on cough, sore throat and fever.    UPPER RESPIRATORY TRACT INFECTION Duration: 1-2 weeks Worst symptom: diarrhea, congestion fever Fever: yes Cough: yes Shortness of breath: no Wheezing: no Chest pain: no Chest tightness: no Chest congestion: no Nasal congestion: yes Runny nose: yes Post nasal drip: yes Sneezing: no Sore throat: yes Swollen glands: no Sinus pressure: yes Headache: yes Face pain: no Toothache: no Ear pain: yes bilateral Ear pressure: yes bilateral Eyes red/itching:no Eye drainage/crusting: no  Vomiting: no Rash: no Fatigue: yes Sick contacts: yes Strep contacts: no  Context: worse Recurrent sinusitis: no Relief with OTC cold/cough medications: no  Treatments attempted: cold/sinus, mucinex, anti-histamine and antibiotics   Relevant past medical, surgical, family and social history reviewed and updated as indicated. Interim medical history since our last visit reviewed. Allergies and medications reviewed and updated.  Review of Systems  Constitutional: Positive for chills, diaphoresis, fatigue and fever. Negative for activity change, appetite change and unexpected weight change.  HENT: Positive for congestion, ear pain, postnasal drip, rhinorrhea and sinus pressure. Negative for dental problem, drooling, ear discharge, facial swelling, hearing loss, mouth sores, nosebleeds, sinus pain, sneezing, sore throat, tinnitus, trouble swallowing and voice change.   Respiratory: Positive for cough. Negative for apnea, choking, chest tightness, shortness of breath, wheezing and stridor.     Cardiovascular: Negative.   Gastrointestinal: Negative.   Musculoskeletal: Negative.   Psychiatric/Behavioral: Negative.     Per HPI unless specifically indicated above     Objective:    There were no vitals taken for this visit.  Wt Readings from Last 3 Encounters:  05/11/20 129 lb (58.5 kg)  04/09/20 132 lb (59.9 kg)  03/28/20 134 lb (60.8 kg)    Physical Exam Vitals and nursing note reviewed.  Pulmonary:     Effort: Pulmonary effort is normal. No respiratory distress.     Comments: Speaking in full sentences Neurological:     Mental Status: She is alert.  Psychiatric:        Mood and Affect: Mood normal.        Behavior: Behavior normal.        Thought Content: Thought content normal.        Judgment: Judgment normal.     Results for orders placed or performed during the hospital encounter of 05/11/20  SARS CORONAVIRUS 2 (TAT 6-24 HRS) Nasopharyngeal Nasopharyngeal Swab   Specimen: Nasopharyngeal Swab  Result Value Ref Range   SARS Coronavirus 2 NEGATIVE NEGATIVE      Assessment & Plan:   Problem List Items Addressed This Visit    None    Visit Diagnoses    Viral upper respiratory tract infection    -  Primary   No better with augmentin. Did not get azithromycin. Will send in prednisone and await 2nd covid test. Follow up 1 week. Call with any concerns.        Follow up plan: Return in about 1 week (around 05/29/2020).   . This visit was completed via telephone due to the restrictions of the COVID-19 pandemic.  All issues as above were discussed and addressed but no physical exam was performed. If it was felt that the patient should be evaluated in the office, they were directed there. The patient verbally consented to this visit. Patient was unable to complete an audio/visual visit due to Lack of equipment. Due to the catastrophic nature of the COVID-19 pandemic, this visit was done through audio contact only. . Location of the patient: parking  lot . Location of the provider: work . Those involved with this call:  . Provider: Park Liter, DO . CMA: Yvonna Alanis, [place] . Front Desk/Registration: Don Perking  . Time spent on call: 21 minutes on the phone discussing health concerns. 30 minutes total spent in review of patient's record and preparation of their chart.

## 2020-05-24 MED FILL — METHOTREXATE SODIUM 25 MG/ML INJECTION SOLUTION: SUBCUTANEOUS | 28 days supply | Qty: 4 | Fill #4

## 2020-05-24 MED FILL — FLUTICASONE PROPIONATE 50 MCG/ACTUATION NASAL SPRAY,SUSPENSION: 30 days supply | Qty: 16 | Fill #4 | Status: AC

## 2020-05-24 MED FILL — BD TUBERCULIN SYRINGE 1 ML 27 X 1/2": 28 days supply | Qty: 4 | Fill #3

## 2020-05-24 MED FILL — HUMIRA PEN CITRATE FREE 40 MG/0.4 ML: SUBCUTANEOUS | 28 days supply | Qty: 2 | Fill #2

## 2020-05-24 MED FILL — HUMIRA PEN CITRATE FREE 40 MG/0.4 ML: 28 days supply | Qty: 2 | Fill #2 | Status: AC

## 2020-05-24 MED FILL — FLUTICASONE PROPIONATE 50 MCG/ACTUATION NASAL SPRAY,SUSPENSION: NASAL | 30 days supply | Qty: 16 | Fill #4

## 2020-05-24 MED FILL — METHOTREXATE SODIUM 25 MG/ML INJECTION SOLUTION: 28 days supply | Qty: 4 | Fill #4 | Status: AC

## 2020-05-24 MED FILL — BD TUBERCULIN SYRINGE 1 ML 27 X 1/2": 28 days supply | Qty: 4 | Fill #3 | Status: AC

## 2020-05-26 ENCOUNTER — Telehealth: Payer: Self-pay | Admitting: *Deleted

## 2020-05-26 NOTE — Telephone Encounter (Signed)
Pt notified that lung cancer screening imaging is due currently or in the near future. She currently smokes one pack per day. Appointment made for 06/21/2020 at 2:00 pm.

## 2020-05-29 ENCOUNTER — Ambulatory Visit: Payer: Self-pay | Admitting: *Deleted

## 2020-05-29 ENCOUNTER — Other Ambulatory Visit: Payer: Self-pay | Admitting: *Deleted

## 2020-05-29 DIAGNOSIS — Z122 Encounter for screening for malignant neoplasm of respiratory organs: Secondary | ICD-10-CM

## 2020-05-29 DIAGNOSIS — Z87891 Personal history of nicotine dependence: Secondary | ICD-10-CM

## 2020-05-29 NOTE — Telephone Encounter (Signed)
If virtual unavailable this week, I have availability next week to see her; otherwise agree with below and seek urgent/emergent care if needed in meantime

## 2020-05-29 NOTE — Progress Notes (Signed)
Current smoker, 42 pack year 

## 2020-05-29 NOTE — Telephone Encounter (Signed)
Patient called and c/o persisting symptoms of covid and reports she has been tested 3 times and negative results. Worst c/o at this time is ears stopped up and sinus congestion and sore throat. Symptoms started 5 weeks ago. Has taken erythromycin and prednisone with little relief. Denies difficulty breathing. No available appt until 06/12/20. Patient is very emotional and frustrated and wants to see if any other medications will help her. Care advise given. Patient verbalized understanding of care advise and to call back or go to [facility] or ED if symptoms worsen. Please notify patient if earlier appt available .   Reason for Disposition . [1] PERSISTING SYMPTOMS OF COVID-19 AND [2] NO medical visit for COVID-19 in past 2 weeks  Answer Assessment - Initial Assessment Questions 1. COVID-19 ONSET: "When did the symptoms of COVID-19 first start?"     5 weeks ago  2. DIAGNOSIS CONFIRMATION: "How were you diagnosed?" (e.g., COVID-19 oral or nasal viral test; COVID-19 antibody test; doctor visit)     No . Took 3 covid tests and came back negative 3. MAIN SYMPTOM:  "What is your main concern or symptom right now?" (e.g., breathing difficulty, cough, fatigue. loss of smell)     Not getting over symptoms after 5 weeks and taking erythromycin and prednisone  4. SYMPTOM ONSET: "When did the  symptoms  start?"     5 weeks  5. BETTER-SAME-WORSE: "Are you getting better, staying the same, or getting worse over the last 1 to 2 weeks?"     Same  6. RECENT MEDICAL VISIT: "Have you been seen by a healthcare provider (doctor, NP, PA) for these persisting COVID-19 symptoms?" If Yes, ask: "When were you seen?" (e.g., date)     Yes  7. COUGH: "Do you have a cough?" If Yes, ask: "How bad is the cough?"       Yes  8. FEVER: "Do you have a fever?" If Yes, ask: "What is your temperature, how was it measured, and when did it start?"     Feels feverish  9. BREATHING DIFFICULTY: "Are you having any trouble breathing?" If Yes,  ask: "How bad is your breathing?" (e.g., mild, moderate, severe)    - MILD: No SOB at rest, mild SOB with walking, speaks normally in sentences, can lay down, no retractions, pulse < 100.    - MODERATE: SOB at rest, SOB with minimal exertion and prefers to sit, cannot lie down flat, speaks in phrases, mild retractions, audible wheezing, pulse 100-120.    - SEVERE: Very SOB at rest, speaks in single words, struggling to breathe, sitting hunched forward, retractions, pulse > 120       Chest pain from coughing with some SOB not difficulty 10. HIGH RISK DISEASE: "Do you have any chronic medical problems?" (e.g., asthma, heart or lung disease, weak immune system, obesity, etc.)       COPD 11. PREGNANCY: "Is there any chance you are pregnant?" "When was your last menstrual period?"       na 12. OTHER SYMPTOMS: "Do you have any other symptoms?"  (e.g., fatigue, headache, muscle pain, weakness)       Headache, ear ache, congestion, fatigue , sore throat  Protocols used: CORONAVIRUS (COVID-19) PERSISTING SYMPTOMS FOLLOW-UP CALL-A-AH

## 2020-05-30 NOTE — Telephone Encounter (Signed)
Voicemail not set up, Unable to lvm to  make virtual apt/

## 2020-05-31 NOTE — Telephone Encounter (Signed)
Pt has apt on 06/06/2020 for a virtual apt. Pt verbalized understanding. Let pt know to go to urgent care or Ed if SX get worse. Pt understood and verbalized understanding.

## 2020-06-06 ENCOUNTER — Encounter: Payer: Self-pay | Admitting: Family Medicine

## 2020-06-06 ENCOUNTER — Telehealth (INDEPENDENT_AMBULATORY_CARE_PROVIDER_SITE_OTHER): Payer: Medicare Other | Admitting: Family Medicine

## 2020-06-06 ENCOUNTER — Telehealth: Payer: Medicare Other | Admitting: Family Medicine

## 2020-06-06 DIAGNOSIS — R21 Rash and other nonspecific skin eruption: Secondary | ICD-10-CM

## 2020-06-06 DIAGNOSIS — J069 Acute upper respiratory infection, unspecified: Secondary | ICD-10-CM | POA: Diagnosis not present

## 2020-06-06 MED ORDER — SULFAMETHOXAZOLE-TRIMETHOPRIM 800-160 MG PO TABS: 1.0000 | ORAL_TABLET | Freq: Two times a day (BID) | ORAL | 0 refills | Status: DC

## 2020-06-06 NOTE — Progress Notes (Signed)
There were no vitals taken for this visit.   Subjective:    Patient ID: Emily Johnston, female    DOB: May 25, 1961, 59 y.o.   MRN: [id number]  HPI: Emily Johnston is a 59 y.o. female  Chief Complaint  Patient presents with  . Bleeding/Bruising    pt states she has been having black and blue veins on her hands, states she is worried she has an infection or something with her blood   Has skin breaking open on her hands and arms for the past couple of weeks. Her skin breaks open "like a cat bit you". Not itching. Not scratching it at all. She is on humeria and methotrexate. She sees [facility] derm and has an appointment with them coming up shortly. She is feeling better from her URI. Her congestion is basically gone. No other concerns or complaints at this time.   Relevant past medical, surgical, family and social history reviewed and updated as indicated. Interim medical history since our last visit reviewed. Allergies and medications reviewed and updated.  Review of Systems  Constitutional: Negative.   Respiratory: Negative.   Cardiovascular: Negative.   Gastrointestinal: Negative.   Musculoskeletal: Negative.   Skin: Negative.   Neurological: Negative.   Psychiatric/Behavioral: Negative.     Per HPI unless specifically indicated above     Objective:    There were no vitals taken for this visit.  Wt Readings from Last 3 Encounters:  05/11/20 129 lb (58.5 kg)  04/09/20 132 lb (59.9 kg)  03/28/20 134 lb (60.8 kg)    Physical Exam Vitals and nursing note reviewed.  Constitutional:      General: She is not in acute distress.    Appearance: Normal appearance. She is not ill-appearing, toxic-appearing or diaphoretic.  HENT:     Head: Normocephalic and atraumatic.     Right Ear: External ear normal.     Left Ear: External ear normal.     Nose: Nose normal.     Mouth/Throat:     Mouth: Mucous membranes are moist.     Pharynx: Oropharynx is clear.  Eyes:      General: No scleral icterus.       Right eye: No discharge.        Left eye: No discharge.     Conjunctiva/sclera: Conjunctivae normal.     Pupils: Pupils are equal, round, and reactive to light.  Pulmonary:     Effort: Pulmonary effort is normal. No respiratory distress.     Comments: Speaking in full sentences Musculoskeletal:        General: Normal range of motion.     Cervical back: Normal range of motion.  Skin:    Coloration: Skin is not jaundiced or pale.     Findings: No bruising, erythema, lesion or rash.  Neurological:     Mental Status: She is alert and oriented to person, place, and time. Mental status is at baseline.  Psychiatric:        Mood and Affect: Mood normal.        Behavior: Behavior normal.        Thought Content: Thought content normal.        Judgment: Judgment normal.     Results for orders placed or performed during the hospital encounter of 05/11/20  SARS CORONAVIRUS 2 (TAT 6-24 HRS) Nasopharyngeal Nasopharyngeal Swab   Specimen: Nasopharyngeal Swab  Result Value Ref Range   SARS Coronavirus 2 NEGATIVE NEGATIVE  Assessment & Plan:   Problem List Items Addressed This Visit    None    Visit Diagnoses    Skin eruption    -  Primary   Advised follow up with dermatology. Call with any concerns. Continue to monitor.    Viral upper respiratory tract infection       Resolved. Call with any concerns.    Relevant Medications   sulfamethoxazole-trimethoprim (BACTRIM DS) 800-160 MG tablet       Follow up plan: Return if symptoms worsen or fail to improve.   . This visit was completed via MyChart due to the restrictions of the COVID-19 pandemic. All issues as above were discussed and addressed. Physical exam was done as above through visual confirmation on MyChart. If it was felt that the patient should be evaluated in the office, they were directed there. The patient verbally consented to this visit. . Location of the patient: home . Location of  the provider: work . Those involved with this call:  . Provider: Park Liter, DO . CMA: Yvonna Alanis, [place] . Front Desk/Registration: Don Perking  . Time spent on call: 15 minutes with patient face to face via video conference. More than 50% of this time was spent in counseling and coordination of care. 23 minutes total spent in review of patient's record and preparation of their chart.

## 2020-06-07 ENCOUNTER — Telehealth: Payer: Medicare Other | Admitting: Nurse Practitioner

## 2020-06-07 ENCOUNTER — Encounter: Payer: Self-pay | Admitting: Family Medicine

## 2020-06-18 ENCOUNTER — Ambulatory Visit
Admit: 2020-06-18 | Discharge: 2020-06-19 | Payer: MEDICARE | Attending: Student in an Organized Health Care Education/Training Program | Primary: Student in an Organized Health Care Education/Training Program

## 2020-06-18 DIAGNOSIS — Z79899 Other long term (current) drug therapy: Principal | ICD-10-CM

## 2020-06-18 DIAGNOSIS — R58 Hemorrhage, not elsewhere classified: Principal | ICD-10-CM

## 2020-06-18 DIAGNOSIS — D492 Neoplasm of unspecified behavior of bone, soft tissue, and skin: Principal | ICD-10-CM

## 2020-06-18 DIAGNOSIS — L309 Dermatitis, unspecified: Principal | ICD-10-CM

## 2020-06-18 DIAGNOSIS — D3613 Benign neoplasm of peripheral nerves and autonomic nervous system of lower limb, including hip: Secondary | ICD-10-CM | POA: Diagnosis not present

## 2020-06-18 DIAGNOSIS — C44519 Basal cell carcinoma of skin of other part of trunk: Secondary | ICD-10-CM | POA: Diagnosis not present

## 2020-06-18 NOTE — Unmapped (Addendum)
Today your resident physician was: Dr. Milda Smart    Shave biopsy   A shave biopsy involves numbing a small area of your skin and then obtaining a sample to help [place] with proper diagnosis or skin condition. Biopsy results typically return in 7 to 14 days.    To care for the area: Leave the bandage in place until the morning after your procedure is performed. On a daily basis, carefully remove the bandage, then shower or wash as usual. Allow water to run over the site. Please do not scrub. Carefully dry the area, then apply ointment (some people develop an allergy to Neosporin, so we recommend Vaseline orAquaphor). Cover the site with a fresh bandage. Should any bleeding occur, apply firm pressure for 15 minutes. The treated site will heal best if  a scab never forms (the wound heals by new skin cells traveling from the outside toward the middle-their journey is easier if no scab stands in their way).    Long-term care: the site will be more sensitive than your surrounding skin. Keep it covered, and remember to apply sunscreen every day to all your exposed skin. A scar may remain which is lighter or pinker than your normal skin. Your body will continue to improve your scar for up to one year.    Infection following this procedure is rare. However, if you are worried about the appearance of your site, contact your doctor. Complete healing may take up to one month. We have a physician on call at all times. If you have any concerns about the site, please call our clinic at [phone]

## 2020-06-18 NOTE — Unmapped (Signed)
Dermatology Note     Assessment and Plan:      Neoplasia of uncertain etiology:  - To help confirm diagnosis, biopsy(s) obtained today.   Biopsy (Shave) Procedure Note: After discussion of risks, benefits, alternatives, patient agreed to procedure and verbal consent was obtained. The area was marked, photographed, prepped with alcohol, and anesthetized with lidocaine 2% with epinephrine. Biopsy was performed using a shave technique. Hemostasis was achieved with pressure and aluminum chloride. Bandage was applied and instructions were provided. I will call patient with the results.  Lesion A: Location: R chin. Size: 5 mm.   DDX: irritated nevus vs other  Lesion B: Location: R upper back Size: 5 mm.   DDX: BCC vs SCC vs AK vs KA vs SK    Photo(s):  Lesion A      Lesion B      Hand dermatitis in patient with hx of PsA- improved on MTX (25mg  weekly), acitretin (25mg  daily), Humira- improved  - Rheumatology prescribing methotrexate at 25 mg weekly for psoriatic arthritis; no prior history of cutaneous psoriasis  - DDx includes psoriasis (favored) versus allergic contact dermatitis vs other  - Has responded to short courses of prednisone in the past  - Failed Otezla (GI side effects and headaches)  - Recommended stopping acitretin, and to message [place] if she starts flaring  - Currently using clobetasol ointment ointment  - Continue Humira; will considering changing to IL-17 if hand lesions persist  - Pt expecting to get light box soon (approved with insurance) Dance movement psychotherapist); pt still waiting    High risk medication use: MTX, Adalimumab, Acitretin  - Quant Gold 5/21  - Labs 05/08/20 WNL    Excoriated papules with ulcerations on dorsal hands and forearms- cannot r/o PCT vs TNF- alpha induced lupus  - Recommend strict sun protection  - Will stop acitretin and see if it improves skin ulcers  - If lesions persist, we will transition from Humira to IL-17     Ecchymoses- dorsal forearms   - Discussed that it represents blood under the skin, benign bruising    The patient was advised to call for an appointment should any new, changing, or symptomatic lesions develop.     RTC: Return in 10 weeks (on 08/27/2020) for Recheck. or sooner as needed   _________________________________________________________________    Chief Complaint     Chief Complaint   Patient presents with   ??? Psoriasis     f/u she is okay, she just concern of the thinning of her skin     HPI     Michele Miller is a 59 y.o. female who presents as a returning patient (last seen 04/30/2020) to [facility] for follow up of hand dermatitis.  She states that she feels like her hands have improved.  She uses clobetasol once to twice a day.  She also is having methotrexate injections weekly.  She is also on Humira every other week.  She also takes isotretinoin 25 mg once a day. She still hasn't received her light box, despite insurance approving it.     She endorses some new pustules that occur on her dorsal hands, that are not itchy, but she states that they bruise and worry her.  Not tried treating them previously.  She states that they have recently popped up.    The patient denies any other new or changing lesions or areas of concern.     Pertinent Past Medical History     Problem  List     None        Family History:   Unknown     Past Medical History, Family History, Social History, Medication List, Allergies, and Problem List were reviewed in the rooming section of Epic.     ROS: Other than symptoms mentioned in the HPI, no fevers, chills, or other skin complaints    Physical Examination     GENERAL: Well-appearing female in no acute distress, resting comfortably.  NEURO: Alert and oriented, answers questions appropriately  SKIN (Focal Skin Exam): Per patient request, examination of hands, digits bilaterally, back, chest, face  was performed was performed  - Xerosis with thick scale on palmar aspect bilaterally- improved   - Pitting on all fingernails  - Excoriated ulcers on dorsal hands  - Purple macules on dorsal forearms and hands   - 5 mm skin colored papule on R chin  - 5 mm pink papule with rolled borders and telangiectasias on R upper back     All areas not commented on are within normal limits or unremarkable      (Approved Template 04/25/2020)

## 2020-06-19 MED ORDER — LORATADINE 10 MG TABLET
ORAL_TABLET | Freq: Every day | ORAL | 2 refills | 30.00000 days
Start: 2020-06-19 — End: 2021-06-19

## 2020-06-19 NOTE — Unmapped (Signed)
[facility] Refill Coordination Note    Specialty Medication(s) to be Shipped:   Inflammatory Disorders: Humira and methotrexate (injectable)    Other medication(s) to be shipped: bd tuberculin syringes     [name], DOB: 08/12/1961  Phone: [phone] (home)       All above HIPAA information was verified with patient.     Was a Nurse, learning disability used for this call? No    Completed refill call assessment today to schedule patient's medication shipment from the [facility] [phone]).       Specialty medication(s) and dose(s) confirmed: Regimen is correct and unchanged.   Changes to medications: [name] reports no changes at this time.  Changes to insurance: No  Questions for the pharmacist: No    Confirmed patient received Welcome Packet with first shipment. The patient will receive a drug information handout for each medication shipped and additional FDA Medication Guides as required.       DISEASE/MEDICATION-SPECIFIC INFORMATION        For patients on injectable medications: Patient currently has 0 doses left.  Next injection is scheduled for 06/22/20 for Humira and Methotrexate.    SPECIALTY MEDICATION ADHERENCE     Medication Adherence    Patient reported X missed doses in the last month: 0  Specialty Medication: Humira 40mg /0.56ml  Patient is on additional specialty medications: Yes  Additional Specialty Medications: Methotrexate 25mg /ml  Patient Reported Additional Medication X Missed Doses in the Last Month: 0  Patient is on more than two specialty medications: No  Informant: patient                    SHIPPING     Shipping address confirmed in Epic.     Delivery Scheduled: Yes, Expected medication delivery date: 06/21/20.     Medication will be delivered via Same Day Courier to the prescription address in Epic WAM.    Jasper Loser   [facility] Specialty Technician

## 2020-06-21 ENCOUNTER — Ambulatory Visit: Admission: RE | Admit: 2020-06-21 | Payer: Medicare Other | Source: Ambulatory Visit

## 2020-06-21 MED FILL — METHOTREXATE SODIUM 25 MG/ML INJECTION SOLUTION: 28 days supply | Qty: 4 | Fill #5 | Status: AC

## 2020-06-21 MED FILL — HUMIRA PEN CITRATE FREE 40 MG/0.4 ML: 28 days supply | Qty: 2 | Fill #3 | Status: AC

## 2020-06-21 MED FILL — BD TUBERCULIN SYRINGE 1 ML 27 X 1/2": 28 days supply | Qty: 4 | Fill #4

## 2020-06-21 MED FILL — HUMIRA PEN CITRATE FREE 40 MG/0.4 ML: SUBCUTANEOUS | 28 days supply | Qty: 2 | Fill #3

## 2020-06-21 MED FILL — BD TUBERCULIN SYRINGE 1 ML 27 X 1/2": 28 days supply | Qty: 4 | Fill #4 | Status: AC

## 2020-06-21 MED FILL — METHOTREXATE SODIUM 25 MG/ML INJECTION SOLUTION: SUBCUTANEOUS | 28 days supply | Qty: 4 | Fill #5

## 2020-06-25 ENCOUNTER — Other Ambulatory Visit: Payer: Self-pay

## 2020-06-25 ENCOUNTER — Ambulatory Visit
Admission: RE | Admit: 2020-06-25 | Discharge: 2020-06-25 | Disposition: A | Discharge status: Home / Self Care | Payer: Medicare Other | Source: Ambulatory Visit | Attending: Nurse Practitioner | Admitting: Nurse Practitioner

## 2020-06-25 DIAGNOSIS — Z122 Encounter for screening for malignant neoplasm of respiratory organs: Secondary | ICD-10-CM | POA: Insufficient documentation

## 2020-06-25 DIAGNOSIS — H5203 Hypermetropia, bilateral: Secondary | ICD-10-CM | POA: Diagnosis not present

## 2020-06-25 DIAGNOSIS — Z87891 Personal history of nicotine dependence: Secondary | ICD-10-CM | POA: Diagnosis not present

## 2020-06-25 DIAGNOSIS — F17210 Nicotine dependence, cigarettes, uncomplicated: Secondary | ICD-10-CM | POA: Diagnosis not present

## 2020-06-26 ENCOUNTER — Encounter: Payer: Self-pay | Admitting: Nurse Practitioner

## 2020-06-27 ENCOUNTER — Other Ambulatory Visit: Payer: Self-pay | Admitting: Podiatry

## 2020-06-27 ENCOUNTER — Encounter: Payer: Self-pay | Admitting: Podiatry

## 2020-06-27 ENCOUNTER — Ambulatory Visit (INDEPENDENT_AMBULATORY_CARE_PROVIDER_SITE_OTHER): Payer: Medicare Other

## 2020-06-27 ENCOUNTER — Other Ambulatory Visit: Payer: Self-pay

## 2020-06-27 ENCOUNTER — Ambulatory Visit (INDEPENDENT_AMBULATORY_CARE_PROVIDER_SITE_OTHER): Payer: Medicare Other | Admitting: Podiatry

## 2020-06-27 DIAGNOSIS — M722 Plantar fascial fibromatosis: Secondary | ICD-10-CM

## 2020-06-27 MED ORDER — MELOXICAM 15 MG PO TABS: 15.0000 mg | ORAL_TABLET | Freq: Every day | ORAL | 2 refills | Status: DC

## 2020-06-27 NOTE — Progress Notes (Signed)
  Subjective:  Patient ID: Emily Johnston, female    DOB: 23-Aug-1961,  MRN: [id number]  Chief Complaint  Patient presents with  . Foot Pain    Patient presents today for bilat heel pain x months, right worse than left.  She says her right heel really burns, and stabbing pains mostly with walking.  she also c/o know on left arch which does not hurt at the moment    59 y.o. female presents with the above complaint. History confirmed with patient.   Objective:  Physical Exam: warm, good capillary refill, no trophic changes or ulcerative lesions, normal DP and PT pulses and normal sensory exam. Left Foot: small nodule in central band of PF, non tender  Right Foot: pain on palpation to the right heel at the plantar fascia insertion  Radiographs: X-ray of both feet: no fracture, dislocation, swelling or degenerative changes noted, plantar calcaneal spur and posterior calcaneal spur Assessment:   1. Plantar fasciitis of right foot   2. Plantar fasciitis of left foot      Plan:  Patient was evaluated and treated and all questions answered.  -XR reviewed with patient -Educated patient on stretching and icing of the affected limb -Plantar fascial brace dispensed -Injection delivered to the plantar fascia of the right foot. -Rx for meloxicam. Educated on use, risks and benefits of the medication  After sterile prep with povidone-iodine solution and alcohol, the right heel was injected with 0.5cc 2% xylocaine plain, 0.5cc 0.5% marcaine plain, 5mg  triamcinolone acetonide, and 2mg  dexamethasone was injected along the plantar fascia at the insertion on the plantar calcaneus. The patient tolerated the procedure well without complication.   No follow-ups on file.

## 2020-06-27 NOTE — Patient Instructions (Signed)

## 2020-06-28 ENCOUNTER — Other Ambulatory Visit: Payer: Self-pay | Admitting: Podiatry

## 2020-06-28 ENCOUNTER — Encounter: Payer: Self-pay | Admitting: *Deleted

## 2020-07-10 ENCOUNTER — Encounter: Payer: Self-pay | Admitting: Nurse Practitioner

## 2020-07-10 ENCOUNTER — Ambulatory Visit (INDEPENDENT_AMBULATORY_CARE_PROVIDER_SITE_OTHER): Payer: Medicare Other | Admitting: Nurse Practitioner

## 2020-07-10 ENCOUNTER — Other Ambulatory Visit: Payer: Self-pay

## 2020-07-10 VITALS — BP 116/78 | HR 76 | Temp 98.0°F | Resp 16 | Wt 129.6 lb

## 2020-07-10 DIAGNOSIS — M722 Plantar fascial fibromatosis: Secondary | ICD-10-CM | POA: Insufficient documentation

## 2020-07-10 DIAGNOSIS — F332 Major depressive disorder, recurrent severe without psychotic features: Secondary | ICD-10-CM

## 2020-07-10 DIAGNOSIS — F17219 Nicotine dependence, cigarettes, with unspecified nicotine-induced disorders: Secondary | ICD-10-CM

## 2020-07-10 DIAGNOSIS — J432 Centrilobular emphysema: Secondary | ICD-10-CM

## 2020-07-10 DIAGNOSIS — F4310 Post-traumatic stress disorder, unspecified: Secondary | ICD-10-CM

## 2020-07-10 DIAGNOSIS — C44612 Basal cell carcinoma of skin of right upper limb, including shoulder: Secondary | ICD-10-CM

## 2020-07-10 DIAGNOSIS — Z23 Encounter for immunization: Secondary | ICD-10-CM

## 2020-07-10 DIAGNOSIS — I700 Atherosclerosis of aorta: Secondary | ICD-10-CM | POA: Diagnosis not present

## 2020-07-10 DIAGNOSIS — C4491 Basal cell carcinoma of skin, unspecified: Secondary | ICD-10-CM | POA: Insufficient documentation

## 2020-07-10 DIAGNOSIS — Z1211 Encounter for screening for malignant neoplasm of colon: Secondary | ICD-10-CM

## 2020-07-10 DIAGNOSIS — L4050 Arthropathic psoriasis, unspecified: Secondary | ICD-10-CM | POA: Diagnosis not present

## 2020-07-10 NOTE — Assessment & Plan Note (Signed)
Discussed at length with patient, have recommended complete cessation of smoking and educated patient on risk with smoking.  Consider addition of statin and ASA in future.

## 2020-07-10 NOTE — Assessment & Plan Note (Signed)
Chronic, ongoing.  Continue current medication regimen and collaboration with psychiatry and therapist. Denies SI/HI.  She is aware all psychiatric medications are to be filled by her psychiatrist and she needs to ensure continued care with them.

## 2020-07-10 NOTE — Assessment & Plan Note (Signed)
Followed by [facility] dermatology with recent diagnosis, has upcoming procedure to remove.  Reviewed recent notes.

## 2020-07-10 NOTE — Progress Notes (Signed)
BP 116/78 (BP Location: Left Arm, Patient Position: Sitting, Cuff Size: Normal)   Pulse 76   Temp 98 F (36.7 C) (Oral)   Resp 16   Wt 129 lb 9.6 oz (58.8 kg)   SpO2 98%   BMI 22.25 kg/m    Subjective:    Patient ID: Emily Johnston, female    DOB: [birth year], 59 y.o.   MRN: [id number]  HPI: Emily Johnston is a 59 y.o. female  Chief Complaint  Patient presents with  . Follow-up   DEPRESSION Currently followed by psychiatry team, [facility] in [place]. Last seen a few weeks ago per patient report.  Continues on Vyvanse, Cymbalta, , Trazodone, Celexa, Klonopin.  Is being followed by therapy too, which she reports benefit from.  She continues to be followed by [facility] providers for her psoriatic arthritis, continues Humira.  Last saw dermatology at [facility] 06/18/20, did recent biopsy recently noting basal cell carcinoma -- is having remainder removed upcoming. Mood status: stable Satisfied with current treatment?: yes Symptom severity: moderate Duration of current treatment : chronic Side effects: no Medication compliance: good compliance Psychotherapy/counseling: yes current Depressed mood: yes Anxious mood: yes Anhedonia: no Significant weight loss or gain: no Insomnia: yes hard to stay asleep Fatigue: no Feelings of worthlessness or guilt: no Impaired concentration/indecisiveness: yes Suicidal ideations: no Hopelessness: no Crying spells: no Depression screen [employer] 2/9 07/10/2020 05/07/2020 10/24/2019 07/13/2019 06/01/2019  Decreased Interest 1 1 2 2 2   Down, Depressed, Hopeless 1 1 3 3 3   PHQ - 2 Score 2 2 5 5 5   Altered sleeping 2 0 1 2 3   Tired, decreased energy 0 1 1 3 2   Change in appetite 1 1 1 2 2   Feeling bad or failure about yourself  1 0 1 3 3   Trouble concentrating 0 1 1 3 3   Moving slowly or fidgety/restless 0 0 0 2 3  Suicidal thoughts 0 0 0 0 0  PHQ-9 Score 6 5 10 20 21   Difficult doing work/chores Not difficult at all Not difficult  at all Somewhat difficult - Somewhat difficult   COPD Has been smoking 3/4 PPD, has been smoking since age 77. Continues on Stiolto daily and Albuterol as needed.  Had lung CT CA screening 06/25/20, which was benign, but did note aortic atherosclerosis and emphysema.  COPD status: stable Satisfied with current treatment?: yes Oxygen use: no Dyspnea frequency: occasional Cough frequency: occasional Rescue inhaler frequency:  a few times a month Limitation of activity: no Productive cough: currently Last Spirometry: 03/22/2019 Pneumovax: Up to Date Influenza: Up to Date   PLANTAR FASCITIS AND BONE SPURS: Seen by podiatry on 06/27/20, was provided injection in right heel which she reports this was beneficial.  Has Meloxicam to take as needed too.  Relevant past medical, surgical, family and social history reviewed and updated as indicated. Interim medical history since our last visit reviewed. Allergies and medications reviewed and updated.  Review of Systems  Constitutional: Negative for activity change, appetite change, diaphoresis, fatigue and fever.  Respiratory: Negative for cough, chest tightness and shortness of breath.   Cardiovascular: Negative for chest pain, palpitations and leg swelling.  Gastrointestinal: Negative.   Endocrine: Negative for cold intolerance and heat intolerance.  Neurological: Negative.   Psychiatric/Behavioral: Negative.     Per HPI unless specifically indicated above     Objective:    BP 116/78 (BP Location: Left Arm, Patient Position: Sitting, Cuff Size: Normal)   Pulse 76  Temp 98 F (36.7 C) (Oral)   Resp 16   Wt 129 lb 9.6 oz (58.8 kg)   SpO2 98%   BMI 22.25 kg/m   Wt Readings from Last 3 Encounters:  07/10/20 129 lb 9.6 oz (58.8 kg)  06/25/20 128 lb (58.1 kg)  05/11/20 129 lb (58.5 kg)    Physical Exam Vitals and nursing note reviewed.  Constitutional:      General: She is awake. She is not in acute distress.    Appearance: She  is well-developed and well-groomed. She is not ill-appearing.  HENT:     Head: Normocephalic.     Right Ear: Hearing normal.     Left Ear: Hearing normal.  Eyes:     General: Lids are normal.        Right eye: No discharge.        Left eye: No discharge.     Conjunctiva/sclera: Conjunctivae normal.     Pupils: Pupils are equal, round, and reactive to light.  Neck:     Thyroid: No thyromegaly.     Vascular: No carotid bruit.  Cardiovascular:     Rate and Rhythm: Normal rate and regular rhythm.     Heart sounds: Normal heart sounds. No murmur heard.  No gallop.   Pulmonary:     Effort: Pulmonary effort is normal. No accessory muscle usage or respiratory distress.     Breath sounds: Normal breath sounds.  Abdominal:     General: Bowel sounds are normal.     Palpations: Abdomen is soft. There is no hepatomegaly or splenomegaly.  Musculoskeletal:     Cervical back: Normal range of motion and neck supple.     Right lower leg: No edema.     Left lower leg: No edema.  Skin:    General: Skin is warm and dry.  Neurological:     Mental Status: She is alert and oriented to person, place, and time.  Psychiatric:        Attention and Perception: Attention normal.        Mood and Affect: Mood normal.        Speech: Speech normal.        Behavior: Behavior normal. Behavior is cooperative.        Thought Content: Thought content normal.      Results for orders placed or performed during the hospital encounter of 05/11/20  SARS CORONAVIRUS 2 (TAT 6-24 HRS) Nasopharyngeal Nasopharyngeal Swab   Specimen: Nasopharyngeal Swab  Result Value Ref Range   SARS Coronavirus 2 NEGATIVE NEGATIVE      Assessment & Plan:   Problem List Items Addressed This Visit      Cardiovascular and Mediastinum   Aortic atherosclerosis ([place])    Discussed at length with patient, have recommended complete cessation of smoking and educated patient on risk with smoking.  Consider addition of statin and ASA in  future.        Respiratory   Centrilobular emphysema (HCC)    Chronic, stable.  Continue current medication regimen and adjust as needed. Spirometry due, will obtain next visit.  Recommend complete cessation smoking.  Continue annual CT screening.  Return in 4 months for physical and spirometry.        Nervous and Auditory   Nicotine dependence, cigarettes, w unsp disorders    I have recommended complete cessation of tobacco use. I have discussed various options available for assistance with tobacco cessation including over the counter methods (Nicotine gum, patch and  lozenges). We also discussed prescription options (Chantix, Nicotine Inhaler / Nasal Spray). The patient is not interested in pursuing any prescription tobacco cessation options at this time.         Musculoskeletal and Integument   Psoriatic arthritis ([place])    Continue collaboration with rheumatology & dermatology at [facility] and current medication regimen.  Reviewed recent notes.      Plantar fasciitis    Currently followed by podiatry, recent note reviewed, continue collaboration.      Basal cell carcinoma (BCC)    Followed by [facility] dermatology with recent diagnosis, has upcoming procedure to remove.  Reviewed recent notes.        Other   Severe recurrent major depression without psychotic features ([place]) - Primary    Chronic, ongoing.  Continue current medication regimen and collaboration with psychiatry and therapist. Denies SI/HI.  She is aware all psychiatric medications are to be filled by her psychiatrist and she needs to ensure continued care with them.      PTSD (post-traumatic stress disorder)    Chronic, ongoing.  Continue current medication regimen and collaboration with psychiatry + therapy sessions, which patient has reported benefit from.  Denies SI/HI.       Other Visit Diagnoses    Screen for colon cancer       GI referral in place   Relevant Orders   Ambulatory referral to Gastroenterology         Follow up plan: Return in about 4 months (around 11/10/2020) for Annual physical with spirometry.

## 2020-07-10 NOTE — Assessment & Plan Note (Signed)
Continue collaboration with rheumatology & dermatology at [facility] and current medication regimen.  Reviewed recent notes.

## 2020-07-10 NOTE — Assessment & Plan Note (Signed)
Chronic, stable.  Continue current medication regimen and adjust as needed. Spirometry due, will obtain next visit.  Recommend complete cessation smoking.  Continue annual CT screening.  Return in 4 months for physical and spirometry.

## 2020-07-10 NOTE — Assessment & Plan Note (Signed)
Currently followed by podiatry, recent note reviewed, continue collaboration.

## 2020-07-10 NOTE — Patient Instructions (Signed)
Chronic Obstructive Pulmonary Disease Chronic obstructive pulmonary disease (COPD) is a long-term (chronic) lung problem. When you have COPD, it is hard for air to get in and out of your lungs. Usually the condition gets worse over time, and your lungs will never return to normal. There are things you can do to keep yourself as healthy as possible.  Your doctor may treat your condition with: ? Medicines. ? Oxygen. ? Lung surgery.  Your doctor may also recommend: ? Rehabilitation. This includes steps to make your body work better. It may involve a team of specialists. ? Quitting smoking, if you smoke. ? Exercise and changes to your diet. ? Comfort measures (palliative care). Follow these instructions at home: Medicines  Take over-the-counter and prescription medicines only as told by your doctor.  Talk to your doctor before taking any cough or allergy medicines. You may need to avoid medicines that cause your lungs to be dry. Lifestyle  If you smoke, stop. Smoking makes the problem worse. If you need help quitting, ask your doctor.  Avoid being around things that make your breathing worse. This may include smoke, chemicals, and fumes.  Stay active, but remember to rest as well.  Learn and use tips on how to relax.  Make sure you get enough sleep. Most adults need at least 7 hours of sleep every night.  Eat healthy foods. Eat smaller meals more often. Rest before meals. Controlled breathing Learn and use tips on how to control your breathing as told by your doctor. Try:  Breathing in (inhaling) through your nose for 1 second. Then, pucker your lips and breath out (exhale) through your lips for 2 seconds.  Putting one hand on your belly (abdomen). Breathe in slowly through your nose for 1 second. Your hand on your belly should move out. Pucker your lips and breathe out slowly through your lips. Your hand on your belly should move in as you breathe out.  Controlled coughing Learn  and use controlled coughing to clear mucus from your lungs. Follow these steps: 1. Lean your head a little forward. 2. Breathe in deeply. 3. Try to hold your breath for 3 seconds. 4. Keep your mouth slightly open while coughing 2 times. 5. Spit any mucus out into a tissue. 6. Rest and do the steps again 1 or 2 times as needed. General instructions  Make sure you get all the shots (vaccines) that your doctor recommends. Ask your doctor about a flu shot and a pneumonia shot.  Use oxygen therapy and pulmonary rehabilitation if told by your doctor. If you need home oxygen therapy, ask your doctor if you should buy a tool to measure your oxygen level (oximeter).  Make a COPD action plan with your doctor. This helps you to know what to do if you feel worse than usual.  Manage any other conditions you have as told by your doctor.  Avoid going outside when it is very hot, cold, or humid.  Avoid people who have a sickness you can catch (contagious).  Keep all follow-up visits as told by your doctor. This is important. Contact a doctor if:  You cough up more mucus than usual.  There is a change in the color or thickness of the mucus.  It is harder to breathe than usual.  Your breathing is faster than usual.  You have trouble sleeping.  You need to use your medicines more often than usual.  You have trouble doing your normal activities such as getting dressed   or walking around the house. Get help right away if:  You have shortness of breath while resting.  You have shortness of breath that stops you from: ? Being able to talk. ? Doing normal activities.  Your chest hurts for longer than 5 minutes.  Your skin color is more blue than usual.  Your pulse oximeter shows that you have low oxygen for longer than 5 minutes.  You have a fever.  You feel too tired to breathe normally. Summary  Chronic obstructive pulmonary disease (COPD) is a long-term lung problem.  The way your  lungs work will never return to normal. Usually the condition gets worse over time. There are things you can do to keep yourself as healthy as possible.  Take over-the-counter and prescription medicines only as told by your doctor.  If you smoke, stop. Smoking makes the problem worse. This information is not intended to replace advice given to you by your health care provider. Make sure you discuss any questions you have with your health care provider. Document Revised: 08/14/2017 Document Reviewed: 10/06/2016 Elsevier Patient Education  2020 Elsevier Inc.  

## 2020-07-10 NOTE — Assessment & Plan Note (Signed)
Chronic, ongoing.  Continue current medication regimen and collaboration with psychiatry + therapy sessions, which patient has reported benefit from.  Denies SI/HI.

## 2020-07-10 NOTE — Assessment & Plan Note (Signed)
I have recommended complete cessation of tobacco use. I have discussed various options available for assistance with tobacco cessation including over the counter methods (Nicotine gum, patch and lozenges). We also discussed prescription options (Chantix, Nicotine Inhaler / Nasal Spray). The patient is not interested in pursuing any prescription tobacco cessation options at this time.  

## 2020-07-11 DIAGNOSIS — L4050 Arthropathic psoriasis, unspecified: Principal | ICD-10-CM

## 2020-07-11 MED ORDER — GABAPENTIN 300 MG CAPSULE
ORAL_CAPSULE | 0 refills | 0 days | Status: CP
Start: 2020-07-11 — End: ?

## 2020-07-12 ENCOUNTER — Telehealth: Admit: 2020-07-12 | Discharge: 2020-07-13 | Payer: MEDICARE | Attending: Rheumatology | Primary: Rheumatology

## 2020-07-12 DIAGNOSIS — Z79899 Other long term (current) drug therapy: Principal | ICD-10-CM

## 2020-07-12 DIAGNOSIS — L4050 Arthropathic psoriasis, unspecified: Principal | ICD-10-CM

## 2020-07-12 NOTE — Unmapped (Signed)
RHEUMATOLOGY RETURN VISIT    REASON FOR VISIT: f/u PsA   PCP: Marjie Skiff, AGNP     Identification: Pt self identified using name and date of birth  Patient location: [place]  The limitations of this telemedicine encounter were discussed with patient. Both the patient and myself agreed to this encounter despite these limitations. Benefits of this telemedicine encounter included allowing for continued care of patient and minimizing risk of exposure to COVID-19.     HISTORY: [name] is a 59 y.o. female with hx of psoriatic arthritis, chronic DDD, and centralized chronic pain. Was following pain management but has lost f/u due to transportation issues, recently referred but not yet seen again.  Psoriatic arthritis treated with mtx SQ monotherapy, initially, following closely with derm due to desquamative rash and now on Humira (started 5/21) and acitretin with skin improvement. Also on pharmacotherapy for fibromyalgia from PCP. Recently seeing dermatology more frequently due to severe involvement of her hands.  Intolerant of Otezla due to mental health issues. ACDF 02/18/24with Dr. Jaynie Collins. Son died in April 01, 2014, mother has breast cancer and patient is primary caregiver.    Interim history:   Started visit via video, then had to change to phone due to internet instability on patient side, >50% via video. She was going to come in person but was not feeling well following her flu shot yesterday.  Since April visit, she has been started on Humira (5/21), with continuation of MTX and acitretin (through dermatology), noted to have BCC of right shoulder for which excision is planned, but hand peeling has improved dramatically.  She also saw podiatry, found to have bone spurs and had injection with improvement, has follow up in 2 weeks there  Dr. Jaynie Collins last in July, planned one year follow up but notes inflammation in her neck which does not seem to be getting better, she will reach out to his office  Saw PCP yesterday for flu shot and check up  Notes that she had bleeding under the skin and on her dorsal hands, purplish, thin skin with bleeding, this is improving but was bad 2-3 weeks ago. She notes that she was really sick around this time, COVID negative (both tests), so may have been part of another illness and is getting better. She will send images of this.    CURRENT MEDICATIONS:  Current Outpatient Medications   Medication Sig Dispense Refill   ??? acitretin (SORIATANE) 25 MG capsule Take 1 capsule (25 mg total) by mouth daily. 30 capsule 2   ??? adalimumab 80 mg/0.8 mL-40 mg/0.4 mL PnKt Inject the contents of 1 pen (80mg ) under the skin once for initial dose, then inject the contents of 1 pen (40mg ) every other week beginning 1 week after initial dose. 3 each 0   ??? albuterol HFA 90 mcg/actuation inhaler Inhale 2 puffs by mouth every six (6) hours as needed for wheezing. 54 g 6   ??? ARIPiprazole (ABILIFY) 5 MG tablet      ??? cetirizine (ZYRTEC) 10 MG tablet Take 1 tablet (10 mg total) by mouth daily. 90 tablet 3   ??? clobetasoL (TEMOVATE) 0.05 % ointment Apply topically Two (2) times a day. 120 g 1   ??? clonazePAM (KLONOPIN) 0.5 MG tablet Take 0.5 mg by mouth Three (3) times a day.     ??? clonazePAM (KLONOPIN) 1 MG tablet Take 1 mg by mouth Three (3) times a day.     ??? dexlansoprazole (DEXILANT) 30 mg capsule  Take 60 mg by mouth.     ??? diclofenac sodium (VOLTAREN) 1 % gel Apply 2 grams topically to small joint and 4 grams to large joints as needed up to 4 times daily (Patient taking differently: Apply 4 g topically 4 (four) times a day as needed for arthritis or pain. as needed for joint pain) 300 g PRN   ??? dicyclomine (BENTYL) 10 mg capsule Take 2 capsules (20 mg total) by mouth 4 (four) times a day as needed. 240 capsule 1   ??? DULoxetine (CYMBALTA) 60 MG capsule Take 2 capsules (120 mg total) by mouth daily. 60 capsule 3   ??? empty container (BD SHARPS COLLECTOR) Misc Use as directed. 1 each 3   ??? fluticasone propionate (FLONASE) 50 mcg/actuation nasal spray Use 2 sprays in each nostril daily. 16 g 4   ??? folic acid (FOLVITE) 1 MG tablet Take 1 tablet (1mg ) by mouth daily. 90 tablet 3   ??? gabapentin (NEURONTIN) 300 MG capsule TAKE 1 CAPSULE(300 MG) BY MOUTH THREE TIMES DAILY 270 capsule 0   ??? HUMIRA PEN CITRATE FREE 40 MG/0.4 ML Inject the contents of 1 pen (40mg ) under the skin every 14 days 6 each 3   ??? hydrocortisone 2.5 % ointment Apply 1 application topically Two (2) times a day. Apply in a thin layer to rash. Avoid getting in your eye. 60 g 1   ??? ketotifen (ZADITOR) 0.025 % (0.035 %) ophthalmic solution Administer 1 drop to both eyes Two (2) times a day. 5 mL 0   ??? loratadine (CLARITIN) 10 mg tablet      ??? meclizine (ANTIVERT) 12.5 mg tablet Take 12.5 mg by mouth Three (3) times a day as needed.      ??? methocarbamoL (ROBAXIN) 500 MG tablet Take 1 tablet (500 mg total) by mouth Three (3) times a day. 60 tablet 0   ??? methotrexate 25 mg/mL injection solution Inject 1 mL (25mg ) under the skin once a week 4 mL 5   ??? multivitamin with minerals tablet Take 1 tablet by mouth daily.     ??? naloxone (NARCAN) 4 mg/actuation nasal spray One spray in either nostril once for known/suspected opioid overdose. May repeat every 2-3 minutes in alternating nostril til EMS arrives 2 each 0   ??? polyethylene glycol (MIRALAX) 17 gram packet Mix the contents of 1 packet (17 g) in 4-8 ounces of water, juice, soda, coffee, or tea and take by mouth daily for 5 days, then use as needed (Patient taking differently: Take 17 g by mouth daily as needed. ) 30 packet 3   ??? senna (SENOKOT) 8.6 mg tablet Take 1 tablet by mouth daily as needed for constipation. take 1 pill as needed for cosntipation may increase to 2 tabs if no relief     ??? syringe with needle 1 mL 27 x 1/2 Syrg Use as directed for weekly methotrexate injections 100 each PRN   ??? tazarotene (AVAGE) 0.1 % cream Mix with clobetasol and apply twice daily to rash on hands 60 g 5   ??? tiotropium bromide (SPIRIVA RESPIMAT) 1.25 mcg/actuation Mist Inhale 2 puffs (2.5 mcg) BY MOUTH daily. 4 g 5   ??? tiotropium-olodateroL (STIOLTO RESPIMAT) 2.5-2.5 mcg/actuation Mist Inhale 2 puffs daily.     ??? traMADoL (ULTRAM) 50 mg tablet      ??? traZODone (DESYREL) 50 MG tablet Take 50 mg by mouth nightly. takes 2 tabs      ??? VYVANSE 30 mg capsule  No current facility-administered medications for this visit.       Record Review: Available records were reviewed, including pertinent office visits, labs, and imaging.   Lab Results   Component Value Date    WBC 6.6 05/08/2020    RBC 3.96 05/08/2020    HGB 13.2 05/08/2020    HCT 39.4 05/08/2020    MCV 100 (H) 05/08/2020    MCH 33.3 (H) 05/08/2020    MCHC 33.5 05/08/2020    RDW 14.2 05/08/2020    PLT 306 05/08/2020    MPV 8.1 09/24/2019      Lab Results   Component Value Date    CREATININE 0.72 02/06/2020      Lab Results   Component Value Date    ALKPHOS 88 09/10/2019    BILITOT 0.5 09/10/2019    BILIDIR <0.10 01/18/2016    PROT 6.2 (L) 09/10/2019    ALBUMIN 3.7 09/10/2019    ALT 25 05/08/2020    AST 28 05/08/2020         PHYSICAL EXAM:  GEN: Pleasant, well-appearing female in NAD  EYES: conjunctivae clear  ENT: MMM, no OP lesions  LUNGS: normal WOB  SKIN: Unable to visualize rash today, she will send images  MSK: There is no obvious synovitis in any joint site, normal ROM throughout, no muscular tenderness.  NEURO: no focal deficits    ASSESSMENT/PLAN:  Psoriatic arthritis (CMS-HCC) and recently diagnosed cutaneous psoriasis of hands  Arthralgias stable, still suspect that her arthralgias are more related to OA than active psoriatic arthritis. Denies any acutely inflamed joints.  -Continue f/u with Derm for cutaneous psoriasis, did not tolerate otezla, doing much better on Humira/MTX/acitretin.  -Podiatry follow up in 2 weeks, improved after injection    DERM: BCC and skin peeling  -started Humira and MTX with derm, has seen improvement  -BCC excision 11/8, she will call ahead about premed given severe anxiety and concern about the procedure    DJD spine  Noted in both [facility] and [facility]. C-spine surgery Jan 2021.   -Following with Dr. Jaynie Collins, she will contact him regarding persistent posterior neck pain after her skin surgery    Fibromyalgia  Symptomatic but stable. Continue pharmacotherapy per PCP.     Methotrexate, long term, current use  Update labs at [facility]:  Orders Placed This Encounter   Procedures   ??? CBC w/ Differential   ??? Comprehensive Metabolic Panel       HCM:   - ZOX09 Status: Did not discuss today  - PPSV 23 Status: 11/03/2013  - Annual Influenza vaccine: 07/11/20  - Bone health: not on prednisone   - Contraception: Status post hysterectomy      I spent 25 minutes on the real-time audio and video visit with the patient on the date of service. I spent an additional 5 minutes on pre- and post-visit activities on the date of service.     The patient was not located and I was located within 250 yards of a hospital based location during the real-time audio and video visit. The patient was physically located in [place] or a state in which I am permitted to provide care. The patient and/or parent/guardian understood that s/he may incur co-pays and cost sharing, and agreed to the telemedicine visit. The visit was reasonable and appropriate under the circumstances given the patient's presentation at the time.    The patient and/or parent/guardian has been advised of the potential risks and limitations of this mode of treatment (  including, but not limited to, the absence of in-person examination) and has agreed to be treated using telemedicine. The patient's/patient's family's questions regarding telemedicine have been answered.    If the visit was completed in an ambulatory setting, the patient and/or parent/guardian has also been advised to contact their provider???s office for worsening conditions, and seek emergency medical treatment and/or call 911 if the patient deems either necessary. Lynann Beaver MD [facility]  [facility] Rheumatology

## 2020-07-12 NOTE — Unmapped (Addendum)
Call Derm to see about pre-med for your [facility] removal 11/8    Call Dr. Jaynie Collins about your neck     I will review your labs when available

## 2020-07-13 DIAGNOSIS — L4050 Arthropathic psoriasis, unspecified: Principal | ICD-10-CM

## 2020-07-13 MED ORDER — METHOTREXATE SODIUM 25 MG/ML INJECTION SOLUTION
SUBCUTANEOUS | 5 refills | 28 days
Start: 2020-07-13 — End: ?

## 2020-07-13 NOTE — Unmapped (Signed)
[facility] Refill Coordination Note    Specialty Medication(s) to be Shipped:   Inflammatory Disorders: Humira and methotrexate (injectable)    Other medication(s) to be shipped: syringes     [name], DOB: [birth year]  Phone: [phone] (home)       All above HIPAA information was verified with patient.     Was a Nurse, learning disability used for this call? No    Completed refill call assessment today to schedule patient's medication shipment from the [facility] [phone]).       Specialty medication(s) and dose(s) confirmed: Regimen is correct and unchanged.   Changes to medications: [name] reports no changes at this time.  Changes to insurance: No  Questions for the pharmacist: No    Confirmed patient received Welcome Packet with first shipment. The patient will receive a drug information handout for each medication shipped and additional FDA Medication Guides as required.       DISEASE/MEDICATION-SPECIFIC INFORMATION        For patients on injectable medications: Patient currently has 0 of humira, 2 of methotrexate doses left.  Next injection is scheduled for 11/5 for humira, 10/29 for methotrexate.    SPECIALTY MEDICATION ADHERENCE     Medication Adherence    Patient reported X missed doses in the last month: 0  Specialty Medication: Humira  Patient is on additional specialty medications: Yes  Additional Specialty Medications: methotrexate  Patient Reported Additional Medication X Missed Doses in the Last Month: 0  Patient is on more than two specialty medications: No  Any gaps in refill history greater than 2 weeks in the last 3 months: no  Demonstrates understanding of importance of adherence: yes  Informant: patient                Humira 40mg /0.55ml: Patient has 0 days of medication on hand    Methotrexate 25mg /ml: Patient has 14 days of medication on hand      SHIPPING     Shipping address confirmed in Epic.     Delivery Scheduled: Yes, Expected medication delivery date: 11/4.  However, Rx request for refills was sent to the provider as there are none remaining.     Medication will be delivered via Same Day Courier to the prescription address in Epic WAM.    [name]   [facility] Specialty Technician

## 2020-07-17 NOTE — Unmapped (Signed)
Methotrexate refill  Last ov: 07/12/2020  Next ov: 01/10/2021     Labs:   AST   Date Value Ref Range Status   05/08/2020 28 0 - 40 IU/L Final     ALT   Date Value Ref Range Status   05/08/2020 25 0 - 32 IU/L Final     Creatinine   Date Value Ref Range Status   02/06/2020 0.72 0.57 - 1.00 mg/dL Final     WBC   Date Value Ref Range Status   05/08/2020 6.6 3.4 - 10.8 x10E3/uL Final     HGB   Date Value Ref Range Status   05/08/2020 13.2 11.1 - 15.9 g/dL Final     HCT   Date Value Ref Range Status   05/08/2020 39.4 34.0 - 46.6 % Final     MCV   Date Value Ref Range Status   05/08/2020 100 (H) 79.0 - 97.0 fL Final     RDW   Date Value Ref Range Status   05/08/2020 14.2 11.7 - 15.4 % Final     Platelet   Date Value Ref Range Status   05/08/2020 306 150 - 450 x10E3/uL Final     Neutrophils %   Date Value Ref Range Status   09/10/2019 61.9 % Final     Lymphocytes %   Date Value Ref Range Status   02/06/2020 32 Not Estab. % Final     Monocytes %   Date Value Ref Range Status   02/06/2020 6 Not Estab. % Final     Eosinophils %   Date Value Ref Range Status   02/06/2020 1 Not Estab. % Final     Basophils %   Date Value Ref Range Status   02/06/2020 1 Not Estab. % Final

## 2020-07-19 DIAGNOSIS — F411 Generalized anxiety disorder: Principal | ICD-10-CM

## 2020-07-19 MED ORDER — DIAZEPAM 5 MG TABLET
ORAL_TABLET | ORAL | 0 refills | 0.00000 days | Status: CP
Start: 2020-07-19 — End: 2020-09-24

## 2020-07-19 MED FILL — METHOTREXATE SODIUM 25 MG/ML INJECTION SOLUTION: SUBCUTANEOUS | 28 days supply | Qty: 4 | Fill #0

## 2020-07-19 MED FILL — HUMIRA PEN CITRATE FREE 40 MG/0.4 ML: SUBCUTANEOUS | 28 days supply | Qty: 2 | Fill #4

## 2020-07-19 MED FILL — BD TUBERCULIN SYRINGE 1 ML 27 X 1/2": 28 days supply | Qty: 4 | Fill #5 | Status: AC

## 2020-07-19 MED FILL — BD TUBERCULIN SYRINGE 1 ML 27 X 1/2": 28 days supply | Qty: 4 | Fill #5

## 2020-07-19 MED FILL — HUMIRA PEN CITRATE FREE 40 MG/0.4 ML: 28 days supply | Qty: 2 | Fill #4 | Status: AC

## 2020-07-19 MED FILL — METHOTREXATE SODIUM 25 MG/ML INJECTION SOLUTION: 28 days supply | Qty: 4 | Fill #0 | Status: AC

## 2020-07-19 NOTE — Unmapped (Signed)
Discussed pre-op valium dose to due pre-op anxiety. Pt agrees to have a driver take her home from her procedure on 07/23/20. Pt understands to come to the procedure with her medicine and take it 30 min prior to the start of the procedure itself. Rx sent in for 2 tables of valium.

## 2020-07-20 DIAGNOSIS — Z1211 Encounter for screening for malignant neoplasm of colon: Principal | ICD-10-CM

## 2020-07-23 ENCOUNTER — Encounter
Admit: 2020-07-23 | Discharge: 2020-07-24 | Payer: MEDICARE | Attending: Student in an Organized Health Care Education/Training Program | Primary: Student in an Organized Health Care Education/Training Program

## 2020-07-23 DIAGNOSIS — C4491 Basal cell carcinoma of skin, unspecified: Principal | ICD-10-CM

## 2020-07-23 DIAGNOSIS — C44519 Basal cell carcinoma of skin of other part of trunk: Secondary | ICD-10-CM | POA: Diagnosis not present

## 2020-07-23 NOTE — Unmapped (Signed)
Today your resident physician was: Dr. Milda Smart    Post-Operative Instructions  Instructions  ??? Get plenty of rest.  ??? Take pain medication as needed. Start with acetaminophen (Tylenol) and rotate with ibuprofen (Motrin/Advil) as needed. Do not take aspirin or any product containing aspirin.  ??? Ice for 10-15 minutes every hour or more often as needed for pain  Activities  ??? Resume normal activities gradually.  ??? Return to school/work in 1-2 days, depending upon extent of surgery.  ??? ?? Physical activity, including but not limited to the following, should be restricted for four weeks following surgery:  o No sports activities including swimming, hot tubs, baths, bicycle, scooter, skateboard or roller blade riding, dance, karate, horseback riding, Frisbee, golf, tennis, bowling, laser tag  o No gym at school for four weeks  Incision Care  ??? Keep incisions clean  ??? Keep area clean and dry first 24 hours  ??? Gently pat the area dry with a clean towel, do not rub  ??? No tub soaking while sutures are in place  ??? Keep steri-strips on; replace if they come off for 4 weeks  ??? If your wound does not have a steri-strip, keep it clean and greasy with Vaseline twice a day until sutures come out and incision heals  ??? If your wound has glue, DO NOT remove glue from the wound. Keep nails trimmed to prevent picking at the glue. Keep area dry tonight.  Starting tomorrow morning gently wash daily with soap and water.  DO NOT scrub the area.  Pat dry after bathing.  ??? Can begin scar massage at 6 weeks. 15 seconds 3 times a day. Can use ointment of choice such as Mederma.  ??? Avoid exposing scars to sun for at least 12 months  ??? Always use a strong sunblock, if sun exposure is unavoidable (SPF 30 or greater)  What to Expect  ??? Some bruising and swelling.  ??? May have slight bleeding from incision. Apply 4x4 gauze with slight pressure to control bleeding  ??? Occasionally the wound opens at its center. If this happens, a few weeks of dressing changes may be needed until it heals.  Appearance  ??? Final results of surgery may take a year or more.  When to Call  ??? If swelling and redness persist after a few days.  ??? If you have increased redness along the incision.  ??? If you have severe or increased pain not relieved by medication.  ??? If you have any side effects to medications, such as, rash, nausea, headache, vomiting.  ??? If you have an oral temperature over 100.4 degrees.  ??? If you have any yellowish or greenish drainage from the incisions or notice a foul odor.  ??? If you have bleeding from the incisions that is difficult to control with light pressure  For Medical Questions, Please Call:  ??? Monday-Friday. 8 a.m. - 5 p.m. Office [phone]  ??? After hours and on weekends, call the number above and there will be instruction for calling the hospital to page the Dermatology Resident on call.

## 2020-07-23 NOTE — Unmapped (Signed)
PROCEDURE NOTE:  Excision    INDICATION: Treatment/removal of Basal cell carcinoma     Location: R upper back    Preoperative Pathology Report:  Final Diagnosis   Date Value Ref Range Status   06/18/2020   Final    A:  Right chin, shave  - Palisaded encapsulated neuroma    B:  Right upper back, shave  - Basal cell carcinoma, nodular and micronodular type, present in the base of biopsy           Biopsy Photo:       Time out taken: Patient Verification with name, medical record number, and date of birth performed.   Site and procedure verified with patient/physician agreement and clinical photograph.   2 Health Care Workers involved in verification.   Consent form:.  Implantable device: no             Allergies to Anesthetics: no  Intolerance to Epinephrine: no       Anticoagulants: no   Major Medical Problems: NA  Time: 11: 00 AM  Personnel present during procedure/verification: Gwenith Daily, MD   Operating surgeon (s): Gwenith Daily, MD     The size and nature of the excision as well as typical scarring were discussed along with, among others, the risks of bleeding, infection, cutaneous dysesthesia, positive margins, and lesion recurrence.  Written consent obtained/form signed.    EXCISION  Anesthesia: lidocaine 2% with epinephrine was infiltrated at the site with a total of 10 ml injected.   The surgical site was marked, cleansed with chlorhexidine x 2, and draped in a sterile fashion.      Measurements:  Tumor diameter: 6 mm     Margins: 3 mm     Total excision (tumor + margins) size:  12 mm.    Site was excised to the subcutaneous fat.   Specimen tagged: medial tip.   Undermining was performed, and hemostasis was accomplished with electrocautery and pressure.     CLOSURE  Linear layered closure performed to close potential space with:  Buried vertical mattress sutures:   4-0 Vicryl    Superficial suture:   Simple running  5-0 Fast absorbing gut    Length of closure: 40 mm    ANTIBIOTICS: None     DRESSING  A dry pressure dressing was applied to the wound site over petrolatum.   Wound healing expectations and the specifics of wound cleansing and bandaging were discussed in detail. The patient was given a handout reiterating instructions.     Suture removal plan: N/A - all dissolvable suture    RTC: Return in 5 weeks (on 08/27/2020) for Recheck.

## 2020-07-23 NOTE — Unmapped (Signed)
Attempted to contact patient regarding refill and labs.  Unable to leave a message, no voicemail available.

## 2020-07-26 NOTE — Unmapped (Signed)
Result Note: Notified pt that results from excision on back for bx proven BCC showed clear margins.    Tracking: Yes  Pt Notified: MyChart    Final Diagnosis       Date                     Value               Ref Range           Status                07/23/2020                                                       Final             Back, excision - Basal cell carcinoma, nodular type - Biopsy site - Margins free of tumor [FORMATTING REMOVED]  ----------

## 2020-07-30 ENCOUNTER — Ambulatory Visit (INDEPENDENT_AMBULATORY_CARE_PROVIDER_SITE_OTHER): Payer: Medicare Other | Admitting: Podiatry

## 2020-07-30 ENCOUNTER — Encounter: Payer: Self-pay | Admitting: Podiatry

## 2020-07-30 ENCOUNTER — Other Ambulatory Visit: Payer: Self-pay

## 2020-07-30 DIAGNOSIS — M722 Plantar fascial fibromatosis: Secondary | ICD-10-CM

## 2020-07-30 NOTE — Unmapped (Signed)
Reason for call: please fax labs to [phone]       Last ov: Visit date not found  Next ov: 01/10/2021

## 2020-07-30 NOTE — Progress Notes (Signed)
  Subjective:  Patient ID: Emily Johnston, female    DOB: 05/20/1961,  MRN: [id number]  Chief Complaint  Patient presents with  . Plantar Fasciitis    "it was doing good after the injection, but started hurting again about 3 days ago"    59 y.o. female returns with the above complaint. History confirmed with patient.  Not as severe as it was.  Pain is elicited increase.  She has been taking the meloxicam, wearing the plantar fascial brace and doing her stretches.  Objective:  Physical Exam: warm, good capillary refill, no trophic changes or ulcerative lesions, normal DP and PT pulses and normal sensory exam. Left Foot: small nodule in central band of PF, non tender  Right Foot: pain on palpation to the right heel at the plantar fascia insertion, improved since last visit but still tender  Radiographs: X-ray of both feet: no fracture, dislocation, swelling or degenerative changes noted, plantar calcaneal spur and posterior calcaneal spur Assessment:   1. Plantar fasciitis of right foot      Plan:  Patient was evaluated and treated and all questions answered.   -Continue stretching and icing of the affected limb.  She recently had a mass excised from her right shoulder and some of the exercises have been difficult. -Continue wearing plantar fascial brace  -Injection delivered to the plantar fascia of the right foot.  After sterile prep with povidone-iodine solution and alcohol, the right heel was injected with 0.5cc 2% xylocaine plain, 0.5cc 0.5% marcaine plain, 5mg  triamcinolone acetonide, and 2mg  dexamethasone was injected along the plantar fascia at the insertion on the plantar calcaneus. The patient tolerated the procedure well without complication.   Return in about 2 months (around 09/29/2020).

## 2020-07-31 ENCOUNTER — Ambulatory Visit: Payer: Self-pay | Admitting: *Deleted

## 2020-07-31 NOTE — Telephone Encounter (Signed)
Patient called and says she's been taking AZO and finished a Z-pack on yesterday due to urinary burning and frequency that started 5 days ago. She says the burning and frequency has stopped, but now she's having pelvic pain and lower back pain when urinating, constant at 3-4/10. She says she doesn't have vaginal discharge, but she has irritation with urinating. Appointment scheduled for tomorrow, 08/01/20 at 1100 with Marnee Guarneri, NP, care advice given, patient verbalized understanding.  Reason for Disposition  [1] MODERATE (e.g., interferes with normal activities) pelvic pain AND [2] pain comes and goes (cramps) AND [3] present > 24 hours  Answer Assessment - Initial Assessment Questions 1. LOCATION: "Where does it hurt?"      Pelvic area 2. RADIATION: "Does the pain shoot anywhere else?" (e.g., lower back, groin, thighs)     Lower back 3. ONSET: "When did the pain begin?" (e.g., minutes, hours or days ago)      5 days ago 4. SUDDEN: "Gradual or sudden onset?"     Gradual 5. PATTERN "Does the pain come and go, or is it constant?"    - If constant: "Is it getting better, staying the same, or worsening?"      (Note: Constant means the pain never goes away completely; most serious pain is constant and gets worse over time)     - If intermittent: "How long does it last?" "Do you have pain now?"     (Note: Intermittent means the pain goes away completely between bouts)     Constant 6. SEVERITY: "How bad is the pain?"  (e.g., Scale 1-10; mild, moderate, or severe)   - MILD (1-3): doesn't interfere with normal activities, area soft and not tender to touch    - MODERATE (4-7): interferes with normal activities or awakens from sleep, tender to touch    - SEVERE (8-10): excruciating pain, doubled over, unable to do any normal activities      3-4 7. RECURRENT SYMPTOM: "Have you ever had this type of pelvic pain before?" If Yes, ask: "When was the last time?" and "What happened that time?"       No 8. CAUSE: "What do you think is causing the pelvic pain?"     Urinary issues, burning with urination 9. RELIEVING/AGGRAVATING FACTORS: "What makes it better or worse?" (e.g., activity/rest, sexual intercourse, voiding, passing stool)     Voiding 10. OTHER SYMPTOMS: "Has there been any other symptoms?" (e.g., fever, vaginal bleeding, vaginal discharge, diarrhea, constipation, or voiding problems?"      Voiding frequency, diarrhea 11. PREGNANCY: "Is there any chance you are pregnant?" "When was your last menstrual period?"       No  Protocols used: PELVIC PAIN - [facility]

## 2020-08-01 ENCOUNTER — Other Ambulatory Visit: Payer: Self-pay

## 2020-08-01 ENCOUNTER — Encounter: Payer: Self-pay | Admitting: Nurse Practitioner

## 2020-08-01 ENCOUNTER — Ambulatory Visit (INDEPENDENT_AMBULATORY_CARE_PROVIDER_SITE_OTHER): Payer: Medicare Other | Admitting: Nurse Practitioner

## 2020-08-01 VITALS — BP 113/78 | HR 81 | Temp 98.1°F | Wt 128.0 lb

## 2020-08-01 DIAGNOSIS — N949 Unspecified condition associated with female genital organs and menstrual cycle: Secondary | ICD-10-CM | POA: Insufficient documentation

## 2020-08-01 DIAGNOSIS — L4050 Arthropathic psoriasis, unspecified: Secondary | ICD-10-CM | POA: Diagnosis not present

## 2020-08-01 LAB — URINALYSIS, ROUTINE W REFLEX MICROSCOPIC
Bilirubin, UA: NEGATIVE
Glucose, UA: NEGATIVE
Leukocytes,UA: NEGATIVE
Nitrite, UA: NEGATIVE
Protein,UA: NEGATIVE
RBC, UA: NEGATIVE
Specific Gravity, UA: 1.025 (ref 1.005–1.030)
Urobilinogen, Ur: 0.2 mg/dL (ref 0.2–1.0)
pH, UA: 5 (ref 5.0–7.5)

## 2020-08-01 LAB — WET PREP FOR TRICH, YEAST, CLUE
Clue Cell Exam: POSITIVE — AB
Trichomonas Exam: NEGATIVE
Yeast Exam: NEGATIVE

## 2020-08-01 MED ORDER — BENZONATATE 100 MG PO CAPS: 100.0000 mg | ORAL_CAPSULE | Freq: Three times a day (TID) | ORAL | 1 refills | Status: DC | PRN

## 2020-08-01 MED ORDER — METRONIDAZOLE 500 MG PO TABS: 500.0000 mg | ORAL_TABLET | Freq: Two times a day (BID) | ORAL | 0 refills | Status: DC

## 2020-08-01 MED ORDER — FLUTICASONE PROPIONATE 50 MCG/ACT NA SUSP
2.0000 | Freq: Every day | NASAL | 4 refills | Status: DC
Start: 2020-08-01 — End: 2020-09-04

## 2020-08-01 NOTE — Unmapped (Signed)
Lab orders faxed.

## 2020-08-01 NOTE — Progress Notes (Signed)
BP 113/78   Pulse 81   Temp 98.1 F (36.7 C)   Wt 128 lb (58.1 kg)   SpO2 98%   BMI 21.97 kg/m    Subjective:    Patient ID: Emily Johnston, female    DOB: [birth year], 59 y.o.   MRN: [id number]  HPI: Emily Johnston is a 59 y.o. female  Chief Complaint  Patient presents with  . Urinary Tract Infection    pt states she is having burning during urination, lower abominal pain and back pain. Pt states she is nauseas    URINARY SYMPTOMS Started after abx use in September -- but has become worse.  States feels agitation to vaginal area and hurting in lower back and suprapubic area.  She is also requesting Tessalon refills. Dysuria: burning Urinary frequency: yes Urgency: no Small volume voids: no Symptom severity: yes Urinary incontinence: no Foul odor: no Hematuria: no Abdominal pain: yes Back pain: yes Suprapubic pain/pressure: yes Flank pain: no Fever:  no Vomiting: no -- nausea Relief with pyridium: taking without much relief Status: stable Previous urinary tract infection: yes Recurrent urinary tract infection: no Sexual activity: No sexually active History of sexually transmitted disease: no Treatments attempted: pyridium and increasing fluids   Is requesting labs be drawn here today -- she sees Dr. Meda Coffee with rheumatology at [facility] and is on Methotrexate -- recent telemedicine visit on 07/12/20 and noted need for CBC and CMP -- which were ordered by rheumatology for patient to have done outpatient, she wishes to have done here.  Relevant past medical, surgical, family and social history reviewed and updated as indicated. Interim medical history since our last visit reviewed. Allergies and medications reviewed and updated.  Review of Systems  Constitutional: Negative for activity change, appetite change, diaphoresis, fatigue and fever.  Respiratory: Negative for cough, chest tightness and shortness of breath.   Cardiovascular: Negative for chest pain,  palpitations and leg swelling.  Gastrointestinal: Negative.   Endocrine: Negative for cold intolerance and heat intolerance.  Neurological: Negative.   Psychiatric/Behavioral: Negative.     Per HPI unless specifically indicated above     Objective:    BP 113/78   Pulse 81   Temp 98.1 F (36.7 C)   Wt 128 lb (58.1 kg)   SpO2 98%   BMI 21.97 kg/m   Wt Readings from Last 3 Encounters:  08/01/20 128 lb (58.1 kg)  07/10/20 129 lb 9.6 oz (58.8 kg)  06/25/20 128 lb (58.1 kg)    Physical Exam Vitals and nursing note reviewed.  Constitutional:      General: She is awake. She is not in acute distress.    Appearance: She is well-developed and well-groomed. She is not ill-appearing.  HENT:     Head: Normocephalic.     Right Ear: Hearing normal.     Left Ear: Hearing normal.  Eyes:     General: Lids are normal.        Right eye: No discharge.        Left eye: No discharge.     Conjunctiva/sclera: Conjunctivae normal.     Pupils: Pupils are equal, round, and reactive to light.  Neck:     Thyroid: No thyromegaly.     Vascular: No carotid bruit.  Cardiovascular:     Rate and Rhythm: Normal rate and regular rhythm.     Heart sounds: Normal heart sounds. No murmur heard.  No gallop.   Pulmonary:     Effort: Pulmonary effort  is normal. No accessory muscle usage or respiratory distress.     Breath sounds: Normal breath sounds.  Abdominal:     General: Bowel sounds are normal. There is no distension.     Palpations: Abdomen is soft. There is no hepatomegaly.     Tenderness: There is no abdominal tenderness. There is no right CVA tenderness or left CVA tenderness.  Musculoskeletal:     Cervical back: Normal range of motion and neck supple.     Right lower leg: No edema.     Left lower leg: No edema.  Skin:    General: Skin is warm and dry.  Neurological:     Mental Status: She is alert and oriented to person, place, and time.  Psychiatric:        Attention and Perception:  Attention normal.        Mood and Affect: Mood normal.        Speech: Speech normal.        Behavior: Behavior normal. Behavior is cooperative.        Thought Content: Thought content normal.     Results for orders placed or performed during the hospital encounter of 05/11/20  SARS CORONAVIRUS 2 (TAT 6-24 HRS) Nasopharyngeal Nasopharyngeal Swab   Specimen: Nasopharyngeal Swab  Result Value Ref Range   SARS Coronavirus 2 NEGATIVE NEGATIVE      Assessment & Plan:   Problem List Items Addressed This Visit      Musculoskeletal and Integument   Psoriatic arthritis ([place]) - Primary    Continue collaboration with rheumatology & dermatology at [facility] and current medication regimen.  Reviewed recent notes.  Will obtain CBC and CMP today and fax to Dr. Meda Coffee.      Relevant Orders   CBC with Differential/Platelet   Comprehensive metabolic panel     Other   Vaginal discomfort    Acute with negative UA, except trace ketones.  Wet prep neg trich and yeast, + clue cells.  Reviewed current medications, will send in oral Flagyl 500 MG BID x 7 days.  Discussed with patient and provided education on this regimen.  Recommend she return to office for worsening or ongoing symptoms.      Relevant Orders   WET PREP FOR [place], YEAST, CLUE   UA/M w/rflx Culture, Routine       Follow up plan: Return if symptoms worsen or fail to improve.

## 2020-08-01 NOTE — Assessment & Plan Note (Signed)
Continue collaboration with rheumatology & dermatology at [facility] and current medication regimen.  Reviewed recent notes.  Will obtain CBC and CMP today and fax to Dr. Meda Coffee.

## 2020-08-01 NOTE — Patient Instructions (Signed)

## 2020-08-01 NOTE — Assessment & Plan Note (Signed)
Acute with negative UA, except trace ketones.  Wet prep neg trich and yeast, + clue cells.  Reviewed current medications, will send in oral Flagyl 500 MG BID x 7 days.  Discussed with patient and provided education on this regimen.  Recommend she return to office for worsening or ongoing symptoms.

## 2020-08-01 NOTE — Addendum Note (Signed)
Addended by: Georgina Peer on: 08/01/2020 12:40 PM   Modules accepted: Orders

## 2020-08-02 LAB — COMPREHENSIVE METABOLIC PANEL
ALT: 39 IU/L — ABNORMAL HIGH (ref 0–32)
AST: 33 IU/L (ref 0–40)
Albumin/Globulin Ratio: 2 (ref 1.2–2.2)
Albumin: 4.3 g/dL (ref 3.8–4.9)
Alkaline Phosphatase: 73 IU/L (ref 44–121)
BUN/Creatinine Ratio: 26 — ABNORMAL HIGH (ref 9–23)
BUN: 19 mg/dL (ref 6–24)
Bilirubin Total: <0.2 mg/dL (ref 0.0–1.2)
CO2: 25 mmol/L (ref 20–29)
Calcium: 9.3 mg/dL (ref 8.7–10.2)
Chloride: 107 mmol/L — ABNORMAL HIGH (ref 96–106)
Creatinine, Ser: 0.74 mg/dL (ref 0.57–1.00)
GFR calc Af Amer: 103 mL/min/{1.73_m2} (ref >59)
GFR calc non Af Amer: 90 mL/min/{1.73_m2} (ref >59)
Globulin, Total: 2.1 g/dL (ref 1.5–4.5)
Glucose: 54 mg/dL — ABNORMAL LOW (ref 65–99)
Potassium: 4.6 mmol/L (ref 3.5–5.2)
Sodium: 143 mmol/L (ref 134–144)
Total Protein: 6.4 g/dL (ref 6.0–8.5)

## 2020-08-02 LAB — CBC WITH DIFFERENTIAL/PLATELET
Basophils Absolute: 0.1 10*3/uL (ref 0.0–0.2)
Basos: 1 %
EOS (ABSOLUTE): 0.1 10*3/uL (ref 0.0–0.4)
Eos: 2 %
Hematocrit: 39.8 % (ref 34.0–46.6)
Hemoglobin: 13.5 g/dL (ref 11.1–15.9)
Immature Grans (Abs): 0 10*3/uL (ref 0.0–0.1)
Immature Granulocytes: 0 %
Lymphocytes Absolute: 3.2 10*3/uL — ABNORMAL HIGH (ref 0.7–3.1)
Lymphs: 48 %
MCH: 34 pg — ABNORMAL HIGH (ref 26.6–33.0)
MCHC: 33.9 g/dL (ref 31.5–35.7)
MCV: 100 fL — ABNORMAL HIGH (ref 79–97)
Monocytes Absolute: 0.6 10*3/uL (ref 0.1–0.9)
Monocytes: 8 %
Neutrophils Absolute: 2.8 10*3/uL (ref 1.4–7.0)
Neutrophils: 41 %
Platelets: 367 10*3/uL (ref 150–450)
RBC: 3.97 x10E6/uL (ref 3.77–5.28)
RDW: 13.7 % (ref 11.7–15.4)
WBC: 6.8 10*3/uL (ref 3.4–10.8)

## 2020-08-02 NOTE — Progress Notes (Signed)
I have contacted patient via MyChart, but can you please print and fax these labs to Dr. Meda Coffee her rheumatologist at [facility] -- Fax number on recent note from them is : [phone] (Fax)

## 2020-08-08 ENCOUNTER — Ambulatory Visit: Payer: Self-pay | Admitting: *Deleted

## 2020-08-08 ENCOUNTER — Telehealth: Payer: Self-pay | Admitting: Nurse Practitioner

## 2020-08-08 NOTE — Telephone Encounter (Signed)
I returned pt's call.   She is c/o dry cough, ear fullness and fatigue and aching.   I asked if she had been tested for COVID-19. She has not.   I suggested she go get tested since she has some of the symptoms of COVID.   "I haven't been around anyone except to go to the store".  We discussed OTC cough medications.   She is taking Mucinix but it's not helping.  She's going to try Robitussin.  "I'm going to the store now to get some.   She asked that I let [name] Cannady know about her call which I told her I would.  She has an appt for Monday for these symptoms.    I went over s/s to watch for and go to the ED or Urgent Care if necessary.    She was agreeable to this.  I forwarded my notes to [facility] for [place].   Reason for Disposition  Cough  Answer Assessment - Initial Assessment Questions 1. ONSET: "When did the cough begin?"      Saw [name] last Monday.  Put me on an antibiotics.   My ears and all hurt and I was very tired.   I told her about all this then.   I've been dizzy today.  I took a Meclizine.   I'm real dizzy all the time today.   2. SEVERITY: "How bad is the cough today?"      Dry cough.   3. SPUTUM: "Describe the color of your sputum" (none, dry cough; clear, white, yellow, green)     Dry cough 4. HEMOPTYSIS: "Are you coughing up any blood?" If so ask: "How much?" (flecks, streaks, tablespoons, etc.)     No 5. DIFFICULTY BREATHING: "Are you having difficulty breathing?" If Yes, ask: "How bad is it?" (e.g., mild, moderate, severe)    - MILD: No SOB at rest, mild SOB with walking, speaks normally in sentences, can lay down, no retractions, pulse < 100.    - MODERATE: SOB at rest, SOB with minimal exertion and prefers to sit, cannot lie down flat, speaks in phrases, mild retractions, audible wheezing, pulse 100-120.    - SEVERE: Very SOB at rest, speaks in single words, struggling to breathe, sitting hunched forward, retractions, pulse > 120      No shortness  of breath. 6. FEVER: "Do you have a fever?" If Yes, ask: "What is your temperature, how was it measured, and when did it start?"     No 7. CARDIAC HISTORY: "Do you have any history of heart disease?" (e.g., heart attack, congestive heart failure)      No 8. LUNG HISTORY: "Do you have any history of lung disease?"  (e.g., pulmonary embolus, asthma, emphysema)     COPD no oxygen.   9. PE RISK FACTORS: "Do you have a history of blood clots?" (or: recent major surgery, recent prolonged travel, bedridden)     No 10. OTHER SYMPTOMS: "Do you have any other symptoms?" (e.g., runny nose, wheezing, chest pain)       Ears, fatigue, coughing, aching.   11. PREGNANCY: "Is there any chance you are pregnant?" "When was your last menstrual period?"       N/A 12. TRAVEL: "Have you traveled out of the country in the last month?" (e.g., travel history, exposures)       No  "I don't hardly go out"  Protocols used: COUGH - ACUTE NON-PRODUCTIVE-A-AH

## 2020-08-08 NOTE — Telephone Encounter (Signed)
Error

## 2020-08-13 ENCOUNTER — Telehealth (INDEPENDENT_AMBULATORY_CARE_PROVIDER_SITE_OTHER): Payer: Medicare Other | Admitting: Family Medicine

## 2020-08-13 ENCOUNTER — Encounter: Payer: Self-pay | Admitting: Family Medicine

## 2020-08-13 VITALS — Wt 126.6 lb

## 2020-08-13 DIAGNOSIS — L4050 Arthropathic psoriasis, unspecified: Principal | ICD-10-CM

## 2020-08-13 DIAGNOSIS — B9689 Other specified bacterial agents as the cause of diseases classified elsewhere: Secondary | ICD-10-CM

## 2020-08-13 DIAGNOSIS — N760 Acute vaginitis: Secondary | ICD-10-CM

## 2020-08-13 DIAGNOSIS — R059 Cough, unspecified: Secondary | ICD-10-CM | POA: Diagnosis not present

## 2020-08-13 DIAGNOSIS — F332 Major depressive disorder, recurrent severe without psychotic features: Secondary | ICD-10-CM | POA: Diagnosis not present

## 2020-08-13 MED ORDER — METHOTREXATE SODIUM 25 MG/ML INJECTION SOLUTION
SUBCUTANEOUS | 0 refills | 28.00000 days | Status: CN
Start: 2020-08-13 — End: ?

## 2020-08-13 MED ORDER — PREDNISONE 50 MG PO TABS: 50.0000 mg | ORAL_TABLET | Freq: Every day | ORAL | 0 refills | Status: DC

## 2020-08-13 NOTE — Assessment & Plan Note (Signed)
Advised her to call and follow up with her psychiatrist. Call with any concerns.

## 2020-08-13 NOTE — Progress Notes (Signed)
Wt 126 lb 9.6 oz (57.4 kg)   BMI 21.73 kg/m    Subjective:    Patient ID: Emily Johnston, female    DOB: 03/04/1961, 59 y.o.   MRN: [id number]  HPI: Emily Johnston is a 59 y.o. female  Chief Complaint  Patient presents with  . Cough    Dry cough, seen [name] last week, was takeing [employer], patient states that they dont work  . Fatigue  . Panic Attack    on Saturday, loosing weight  . Depressed  . BV    wants to see if this has cleared up  . Headache   UPPER RESPIRATORY TRACT INFECTION-  Duration: 3 weeks Worst symptom: dry cough, full ears Fever: no Cough: yes Shortness of breath: no Wheezing: no Chest pain: no Chest tightness: no Chest congestion: no Nasal congestion: yes Runny nose: no Post nasal drip: yes Sneezing: no Sore throat: no Swollen glands: no Sinus pressure: no Headache: yes- chronic Face pain: no Toothache: no Ear pain: yes bilateral- chronic Ear pressure: yes bilateral Eyes red/itching:no Eye drainage/crusting: no  Vomiting: no Rash: no Fatigue: yes Sick contacts: no Strep contacts: no  Context: better Recurrent sinusitis: no Relief with OTC cold/cough medications: no  Treatments attempted: cold/sinus, mucinex, anti-histamine, pseudoephedrine, cough syrup and antibiotics   Has been feeling really depressed recently. Follows with [facility] in [place]. She has not called them yet. Had a panic attack on Saturday and has not been feeling like herself. She feels like her depression is leading to the weight loss.   Relevant past medical, surgical, family and social history reviewed and updated as indicated. Interim medical history since our last visit reviewed. Allergies and medications reviewed and updated.  Review of Systems  Constitutional: Positive for appetite change, fatigue and unexpected weight change. Negative for activity change, chills, diaphoresis and fever.  HENT: Positive for congestion, postnasal drip and  sinus pressure. Negative for dental problem, drooling, ear discharge, ear pain, facial swelling, hearing loss, mouth sores, nosebleeds, rhinorrhea, sinus pain, sneezing, sore throat, tinnitus, trouble swallowing and voice change.   Eyes: Negative.   Respiratory: Positive for cough. Negative for apnea, choking, chest tightness, shortness of breath, wheezing and stridor.   Cardiovascular: Negative.   Gastrointestinal: Negative.   Musculoskeletal: Negative.   Skin: Negative.   Neurological: Positive for headaches. Negative for dizziness, tremors, seizures, syncope, facial asymmetry, speech difficulty, weakness, light-headedness and numbness.  Psychiatric/Behavioral: Positive for dysphoric mood. Negative for agitation, behavioral problems, confusion, decreased concentration, hallucinations, self-injury, sleep disturbance and suicidal ideas. The patient is nervous/anxious. The patient is not hyperactive.     Per HPI unless specifically indicated above     Objective:    Wt 126 lb 9.6 oz (57.4 kg)   BMI 21.73 kg/m   Wt Readings from Last 3 Encounters:  08/13/20 126 lb 9.6 oz (57.4 kg)  08/01/20 128 lb (58.1 kg)  07/10/20 129 lb 9.6 oz (58.8 kg)    Physical Exam Vitals and nursing note reviewed.  Constitutional:      General: She is not in acute distress.    Appearance: Normal appearance. She is not ill-appearing, toxic-appearing or diaphoretic.  HENT:     Head: Normocephalic and atraumatic.     Right Ear: External ear normal.     Left Ear: External ear normal.     Nose: Nose normal.     Mouth/Throat:     Mouth: Mucous membranes are moist.     Pharynx: Oropharynx is clear.  Eyes:     General: No scleral icterus.       Right eye: No discharge.        Left eye: No discharge.     Conjunctiva/sclera: Conjunctivae normal.     Pupils: Pupils are equal, round, and reactive to light.  Pulmonary:     Effort: Pulmonary effort is normal. No respiratory distress.     Comments: Speaking in  full sentences Musculoskeletal:        General: Normal range of motion.     Cervical back: Normal range of motion.  Skin:    Coloration: Skin is not jaundiced or pale.     Findings: No bruising, erythema, lesion or rash.  Neurological:     Mental Status: She is alert and oriented to person, place, and time. Mental status is at baseline.  Psychiatric:        Mood and Affect: Mood normal.        Behavior: Behavior normal.        Thought Content: Thought content normal.        Judgment: Judgment normal.     Results for orders placed or performed in visit on 08/01/20  WET PREP FOR [place], YEAST, CLUE   Specimen: Sterile Swab   Sterile Swab  Result Value Ref Range   Trichomonas Exam Negative Negative   Yeast Exam Negative Negative   Clue Cell Exam Positive (A) Negative  CBC with Differential/Platelet  Result Value Ref Range   WBC 6.8 3.4 - 10.8 x10E3/uL   RBC 3.97 3.77 - 5.28 x10E6/uL   Hemoglobin 13.5 11.1 - 15.9 g/dL   Hematocrit 39.8 34.0 - 46.6 %   MCV 100 (H) 79 - 97 fL   MCH 34.0 (H) 26.6 - 33.0 pg   MCHC 33.9 31 - 35 g/dL   RDW 13.7 11.7 - 15.4 %   Platelets 367 150 - 450 x10E3/uL   Neutrophils 41 Not Estab. %   Lymphs 48 Not Estab. %   Monocytes 8 Not Estab. %   Eos 2 Not Estab. %   Basos 1 Not Estab. %   Neutrophils Absolute 2.8 1.40 - 7.00 x10E3/uL   Lymphocytes Absolute 3.2 (H) 0 - 3 x10E3/uL   Monocytes Absolute 0.6 0 - 0 x10E3/uL   EOS (ABSOLUTE) 0.1 0.0 - 0.4 x10E3/uL   Basophils Absolute 0.1 0 - 0 x10E3/uL   Immature Granulocytes 0 Not Estab. %   Immature Grans (Abs) 0.0 0.0 - 0.1 x10E3/uL  Comprehensive metabolic panel  Result Value Ref Range   Glucose 54 (L) 65 - 99 mg/dL   BUN 19 6 - 24 mg/dL   Creatinine, Ser 0.74 0.57 - 1.00 mg/dL   GFR calc non Af Amer 90 >59 mL/min/1.73   GFR calc Af Amer 103 >59 mL/min/1.73   BUN/Creatinine Ratio 26 (H) 9 - 23   Sodium 143 134 - 144 mmol/L   Potassium 4.6 3.5 - 5.2 mmol/L   Chloride 107 (H) 96 - 106 mmol/L    CO2 25 20 - 29 mmol/L   Calcium 9.3 8.7 - 10.2 mg/dL   Total Protein 6.4 6.0 - 8.5 g/dL   Albumin 4.3 3.8 - 4.9 g/dL   Globulin, Total 2.1 1.5 - 4.5 g/dL   Albumin/Globulin Ratio 2.0 1.2 - 2.2   Bilirubin Total <0.2 0.0 - 1.2 mg/dL   Alkaline Phosphatase 73 44 - 121 IU/L   AST 33 0 - 40 IU/L   ALT 39 (H) 0 - 32 IU/L  Urinalysis, Routine w reflex microscopic  Result Value Ref Range   Specific Gravity, UA 1.025 1.005 - 1.030   pH, UA 5.0 5.0 - 7.5   Color, UA Yellow Yellow   Appearance Ur Clear Clear   Leukocytes,UA Negative Negative   Protein,UA Negative Negative/Trace   Glucose, UA Negative Negative   Ketones, UA Trace (A) Negative   RBC, UA Negative Negative   Bilirubin, UA Negative Negative   Urobilinogen, Ur 0.2 0.2 - 1.0 mg/dL   Nitrite, UA Negative Negative      Assessment & Plan:   Problem List Items Addressed This Visit      Other   Severe recurrent major depression without psychotic features ([place])    Advised her to call and follow up with her psychiatrist. Call with any concerns.        Other Visit Diagnoses    Cough    -  Primary   Will get swabbed for COVID- self-quarantine until results are back. Prednisone to help with congestion. If not better, needs in person eval.    Relevant Orders   Novel Coronavirus, NAA ([facility])   BV (bacterial vaginosis)       s/p flagyl. Will get her back in following COVID test for swab.        Follow up plan: Return if symptoms worsen or fail to improve.   . This visit was completed via MyChart due to the restrictions of the COVID-19 pandemic. All issues as above were discussed and addressed. Physical exam was done as above through visual confirmation on MyChart. If it was felt that the patient should be evaluated in the office, they were directed there. The patient verbally consented to this visit. . Location of the patient: home . Location of the provider: work . Those involved with this call:  . Provider: Park Liter,  DO . CMA: Frazier Butt, [place] . Front Desk/Registration: Jill Side  . Time spent on call: 25 minutes with patient face to face via video conference. More than 50% of this time was spent in counseling and coordination of care. 40 minutes total spent in review of patient's record and preparation of their chart.

## 2020-08-14 DIAGNOSIS — L4050 Arthropathic psoriasis, unspecified: Principal | ICD-10-CM

## 2020-08-14 MED ORDER — METHOTREXATE SODIUM 25 MG/ML INJECTION SOLUTION
SUBCUTANEOUS | 0 refills | 28 days | Status: CP
Start: 2020-08-14 — End: 2020-09-04
  Filled 2020-08-15: qty 4, 28d supply, fill #0

## 2020-08-14 NOTE — Unmapped (Signed)
[facility] Clinical Assessment & Refill Coordination Note    Michele Miller, DOB: 12-26-1960  Phone: [phone] (home)     All above HIPAA information was verified with patient.     Was a Nurse, learning disability used for this call? No    Specialty Medication(s):   Inflammatory Disorders: Humira and methotrexate (injectable)     Current Outpatient Medications   Medication Sig Dispense Refill   ??? acitretin (SORIATANE) 25 MG capsule Take 1 capsule (25 mg total) by mouth daily. 30 capsule 2   ??? adalimumab 80 mg/0.8 mL-40 mg/0.4 mL PnKt Inject the contents of 1 pen (80mg ) under the skin once for initial dose, then inject the contents of 1 pen (40mg ) every other week beginning 1 week after initial dose. 3 each 0   ??? albuterol HFA 90 mcg/actuation inhaler Inhale 2 puffs by mouth every six (6) hours as needed for wheezing. 54 g 6   ??? ARIPiprazole (ABILIFY) 5 MG tablet      ??? cetirizine (ZYRTEC) 10 MG tablet Take 1 tablet (10 mg total) by mouth daily. 90 tablet 3   ??? clobetasoL (TEMOVATE) 0.05 % ointment Apply topically Two (2) times a day. 120 g 1   ??? clonazePAM (KLONOPIN) 0.5 MG tablet Take 0.5 mg by mouth Three (3) times a day.     ??? clonazePAM (KLONOPIN) 1 MG tablet Take 1 mg by mouth Three (3) times a day.     ??? diazePAM (VALIUM) 5 MG tablet Take 1 tablet 30 min before your upcoming surgery on 07/23/20. You MUST have someone drive you home from your procedure 2 tablet 0   ??? diclofenac sodium (VOLTAREN) 1 % gel Apply 2 grams topically to small joint and 4 grams to large joints as needed up to 4 times daily (Patient taking differently: Apply 4 g topically 4 (four) times a day as needed for arthritis or pain. as needed for joint pain) 300 g PRN   ??? dicyclomine (BENTYL) 10 mg capsule Take 2 capsules (20 mg total) by mouth 4 (four) times a day as needed. 240 capsule 1   ??? DULoxetine (CYMBALTA) 60 MG capsule Take 2 capsules (120 mg total) by mouth daily. 60 capsule 3   ??? empty container (BD SHARPS COLLECTOR) Misc Use as directed. 1 each 3   ??? fluticasone propionate (FLONASE) 50 mcg/actuation nasal spray Use 2 sprays in each nostril daily. 16 g 4   ??? folic acid (FOLVITE) 1 MG tablet Take 1 tablet (1mg ) by mouth daily. 90 tablet 3   ??? gabapentin (NEURONTIN) 300 MG capsule TAKE 1 CAPSULE(300 MG) BY MOUTH THREE TIMES DAILY 270 capsule 0   ??? HUMIRA PEN CITRATE FREE 40 MG/0.4 ML Inject the contents of 1 pen (40mg ) under the skin every 14 days 6 each 3   ??? hydrocortisone 2.5 % ointment Apply 1 application topically Two (2) times a day. Apply in a thin layer to rash. Avoid getting in your eye. 60 g 1   ??? ketotifen (ZADITOR) 0.025 % (0.035 %) ophthalmic solution Administer 1 drop to both eyes Two (2) times a day. 5 mL 0   ??? loratadine (CLARITIN) 10 mg tablet      ??? meclizine (ANTIVERT) 12.5 mg tablet Take 12.5 mg by mouth Three (3) times a day as needed.      ??? methocarbamoL (ROBAXIN) 500 MG tablet Take 1 tablet (500 mg total) by mouth Three (3) times a day. 60 tablet 0   ???  methotrexate 25 mg/mL injection solution Inject 1 mL (25mg ) under the skin once a week 4 mL 0   ??? multivitamin with minerals tablet Take 1 tablet by mouth daily.     ??? naloxone (NARCAN) 4 mg/actuation nasal spray One spray in either nostril once for known/suspected opioid overdose. May repeat every 2-3 minutes in alternating nostril til EMS arrives 2 each 0   ??? polyethylene glycol (MIRALAX) 17 gram packet Mix the contents of 1 packet (17 g) in 4-8 ounces of water, juice, soda, coffee, or tea and take by mouth daily for 5 days, then use as needed (Patient taking differently: Take 17 g by mouth daily as needed. ) 30 packet 3   ??? senna (SENOKOT) 8.6 mg tablet Take 1 tablet by mouth daily as needed for constipation. take 1 pill as needed for cosntipation may increase to 2 tabs if no relief     ??? syringe 1 mL 27 x 1/2 Syrg Use as directed for weekly methotrexate injections 100 each PRN   ??? tazarotene (AVAGE) 0.1 % cream Mix with clobetasol and apply twice daily to rash on hands 60 g 5   ??? tiotropium bromide (SPIRIVA RESPIMAT) 1.25 mcg/actuation Mist Inhale 2 puffs (2.5 mcg) BY MOUTH daily. 4 g 5   ??? tiotropium-olodateroL (STIOLTO RESPIMAT) 2.5-2.5 mcg/actuation Mist Inhale 2 puffs daily.     ??? traMADoL (ULTRAM) 50 mg tablet      ??? traZODone (DESYREL) 50 MG tablet Take 50 mg by mouth nightly. takes 2 tabs      ??? VYVANSE 30 mg capsule        No current facility-administered medications for this visit.        Changes to medications: Michele Miller reports no changes at this time.    No Known Allergies    Changes to allergies: No    SPECIALTY MEDICATION ADHERENCE     Humira 40mg /0.12ml: 0 days of medicine on hand   methotrexate 25mg /ml: 7 days of medicine on hand     Medication Adherence    Specialty Medication: Humira 40mg /0.72ml  Patient is on additional specialty medications: Yes  Additional Specialty Medications: methotrexate 25mg /ml  Patient Reported Additional Medication X Missed Doses in the Last Month: 0          Specialty medication(s) dose(s) confirmed: Regimen is correct and unchanged.     Are there any concerns with adherence? Not at this time - Michele Miller had a misfire of one of her Humira pens and has been unable to successfully obtain a replacment pen from the manufacturer - as such, she has missed her last dose that was due 08/03/2020 but she has been actively trying to receive this replacement    Adherence counseling provided? Not needed    CLINICAL MANAGEMENT AND INTERVENTION      Clinical Benefit Assessment:    Do you feel the medicine is effective or helping your condition? Yes    Clinical Benefit counseling provided? Not needed    Adverse Effects Assessment:    Are you experiencing any side effects? No    Are you experiencing difficulty administering your medicine? No    Quality of Life Assessment:    Rheumatology:   Quality of Life    On a scale of 1 ??? 10 with 1 representing not at all and 10 representing completely ??? how has your rheumatologic condition affected your:  Daily pain level?: decline to answer  Ability to complete your regular daily tasks (prepare meals, get dressed,  etc.)?: decline to answer  Ability to participate in social or family activities?: decline to answer         Have you discussed this with your provider? Not needed    Therapy Appropriateness:    Is therapy appropriate? Yes, therapy is appropriate and should be continued    DISEASE/MEDICATION-SPECIFIC INFORMATION      For patients on injectable medications: Patient currently has 0 Humira doses left and the next injection is scheduled for ASAP (past due). Patient currently has 1 methotrexate dose left and the next injection is scheduled for 08/17/2020.    PATIENT SPECIFIC NEEDS     - Does the patient have any physical, cognitive, or cultural barriers? No    - Is the patient high risk? No    - Does the patient require a Care Management Plan? No     - Does the patient require physician intervention or other additional services (i.e. nutrition, smoking cessation, social work)? No      SHIPPING     Specialty Medication(s) to be Shipped:   Inflammatory Disorders: Humira 40mg /0.51ml and methotrexate 25mg /ml    Other medication(s) to be shipped: syringes, folic acid     Changes to insurance: No    Delivery Scheduled: Yes, Expected medication delivery date: 08/15/2020.  However, Rx request for methotrexate refills was sent to the provider as there are none remaining.     Medication will be delivered via Same Day Courier to the confirmed prescription address in [facility].    The patient will receive a drug information handout for each medication shipped and additional FDA Medication Guides as required.  Verified that patient has previously received a Conservation officer, historic buildings.    All of the patient's questions and concerns have been addressed.    Karene Fry Kynley Metzger   [facility] Specialty Pharmacist

## 2020-08-15 MED FILL — HUMIRA PEN CITRATE FREE 40 MG/0.4 ML: 28 days supply | Qty: 2 | Fill #5 | Status: AC

## 2020-08-15 MED FILL — BD TUBERCULIN SYRINGE 1 ML 27 X 1/2": 28 days supply | Qty: 4 | Fill #6

## 2020-08-15 MED FILL — BD TUBERCULIN SYRINGE 1 ML 27 X 1/2": 28 days supply | Qty: 4 | Fill #6 | Status: AC

## 2020-08-15 MED FILL — HUMIRA PEN CITRATE FREE 40 MG/0.4 ML: SUBCUTANEOUS | 28 days supply | Qty: 2 | Fill #5

## 2020-08-15 MED FILL — FOLIC ACID 1 MG TABLET: 90 days supply | Qty: 90 | Fill #3 | Status: AC

## 2020-08-15 MED FILL — FOLIC ACID 1 MG TABLET: ORAL | 90 days supply | Qty: 90 | Fill #3

## 2020-08-15 MED FILL — METHOTREXATE SODIUM 25 MG/ML INJECTION SOLUTION: 28 days supply | Qty: 4 | Fill #0 | Status: AC

## 2020-08-16 ENCOUNTER — Other Ambulatory Visit: Payer: Medicare Other

## 2020-08-16 DIAGNOSIS — R059 Cough, unspecified: Secondary | ICD-10-CM | POA: Diagnosis not present

## 2020-08-16 NOTE — Addendum Note (Signed)
Addended by: Gerrit Halls L on: 08/16/2020 01:30 PM   Modules accepted: Orders

## 2020-08-18 LAB — NOVEL CORONAVIRUS, NAA: SARS-CoV-2, NAA: NOT DETECTED

## 2020-08-18 LAB — SARS-COV-2, NAA 2 DAY TAT

## 2020-08-23 ENCOUNTER — Telehealth: Payer: Self-pay

## 2020-08-23 ENCOUNTER — Other Ambulatory Visit: Payer: Self-pay | Admitting: Nurse Practitioner

## 2020-08-23 MED ORDER — METRONIDAZOLE 500 MG PO TABS: 500.0000 mg | ORAL_TABLET | Freq: Two times a day (BID) | ORAL | 0 refills | Status: AC

## 2020-08-23 NOTE — Telephone Encounter (Signed)
Have sent in Flagyl refill, however if ongoing symptoms I do want her to be seen in office ASAP as we will need to retest.

## 2020-08-23 NOTE — Telephone Encounter (Signed)
Copied from [place] [phone]. Topic: General - Other >> Aug 23, 2020  3:01 PM Keene Breath wrote: Reason for CRM: Patient called to inform the doctor that she believes the infection she had in her private parts has come back.  She would prefer the doctor to send in an antibiotic instead of coming into the office  Please advise and call patient to let her know.  CB# [phone]  Pt stated if something can be sent as she does not want to wait for upcoming apts next week and states she is in a lot of pain in her vaginal area and stated she would like something to be sent in as she has used flagy before and stated she was advised to call if SX did not resolve.

## 2020-08-23 NOTE — Telephone Encounter (Signed)
Patient notified

## 2020-08-23 NOTE — Telephone Encounter (Signed)
Pt wanted to left PCP know before scheduling apt. Pt refused to schedule before hearing about PCP opinion.

## 2020-08-23 NOTE — Telephone Encounter (Signed)
Was seen by you in November

## 2020-08-27 ENCOUNTER — Encounter
Admit: 2020-08-27 | Discharge: 2020-08-28 | Payer: MEDICARE | Attending: Student in an Organized Health Care Education/Training Program | Primary: Student in an Organized Health Care Education/Training Program

## 2020-08-27 DIAGNOSIS — Z79899 Other long term (current) drug therapy: Principal | ICD-10-CM

## 2020-08-27 DIAGNOSIS — L309 Dermatitis, unspecified: Principal | ICD-10-CM

## 2020-08-27 DIAGNOSIS — Z85828 Personal history of other malignant neoplasm of skin: Principal | ICD-10-CM

## 2020-08-27 DIAGNOSIS — L409 Psoriasis, unspecified: Principal | ICD-10-CM

## 2020-08-27 MED ORDER — ACITRETIN 25 MG CAPSULE
ORAL_CAPSULE | ORAL | 1 refills | 0.00000 days | Status: CP
Start: 2020-08-27 — End: 2020-09-03

## 2020-08-27 MED ORDER — HUMIRA PEN CITRATE FREE 40 MG/0.4 ML
SUBCUTANEOUS | 3 refills | 84.00000 days | Status: CP
Start: 2020-08-27 — End: 2021-08-27
  Filled 2020-09-11: qty 2, 28d supply, fill #0

## 2020-08-27 NOTE — Unmapped (Signed)
Dermatology Note     Assessment and Plan:      Hand dermatitis in patient with hx of PsA- improved on MTX (25mg  weekly), acitretin (25mg  daily), Humira- much improved  - Rheumatology prescribing methotrexate at 25 mg weekly for psoriatic arthritis; no prior history of cutaneous psoriasis  - DDx includes psoriasis (favored) versus allergic contact dermatitis vs other  - s/p several short courses of prednisone in the past  - Failed Otezla (GI side effects and headaches)  - Discuss again option of weaning down off of acitretin, will go down to 12.5 mg a day  - Currently using clobetasol ointment as needed  - acitretin (SORIATANE) 25 MG capsule; Take 1/2 tablet once daily  Dispense: 90 capsule; Refill: 1  - HUMIRA PEN CITRATE FREE 40 MG/0.4 ML; Inject the contents of 1 pen (40mg ) under the skin every 14 days  Dispense: 6 each; Refill: 3    High risk medication use: MTX, Adalimumab, Acitretin  - Quant Gold 5/21  - Labs 05/08/20 WNL; normal labs 07/2020    History of NMSC  - No evidence of recurrence at prior treatment sites on exam today   - Discussed sun protection measures and recommendations given today  - Encouraged regular self-skin monitoring as well as scheduled clinical examinations for surveillance.    The patient was advised to call for an appointment should any new, changing, or symptomatic lesions develop.     RTC: Return in about 3 months (around 11/25/2020) for Recheck- Dr. Caralyn Guile . or sooner as needed   _________________________________________________________________    Chief Complaint     Chief Complaint   Patient presents with   ??? Biopsy     f/u area healing well, no concerns     HPI     Michele Miller is a 59 y.o. female who presents as a returning patient (last seen 07/23/2020) to [facility] for follow up of hand dermatitis. She states that her hands are doing well. She is still getting some itchy bumps on her forearms. She has not discontinued the Acitretin in fear that it would flare her hand dermatitis.     She feels like the excision site on her RUB is healing well.     The patient denies any other new or changing lesions or areas of concern.     Pertinent Past Medical History     History of Skin Cancer, see below:    Problem List        Musculoskeletal and Integument    Psoriasis    Relevant Medications    acitretin (SORIATANE) 25 MG capsule    HUMIRA PEN CITRATE FREE 40 MG/0.4 ML       Other    History of nonmelanoma skin cancer     Skin Cancer History- Non-Melanoma Skin Cancer    Diagnosis Location Biopsy Date Treatment date Procedure Surgeon   [facility] RUB 06/2020 07/2020 [facility] Michele Miller                                  Family History:   Unknown     Past Medical History, Family History, Social History, Medication List, Allergies, and Problem List were reviewed in the rooming section of Epic.     ROS: Other than symptoms mentioned in the HPI, no fevers, chills, or other skin complaints    Physical Examination     GENERAL: Well-appearing female in no acute distress,  resting comfortably.  NEURO: Alert and oriented, answers questions appropriately  SKIN (Focal Skin Exam): Per patient request, examination of hands, digits bilaterally, back, chest, face  was performed was performed  - Xerosis with thick scale on palmar aspect bilaterally- improved   - Linear scar on R upper back     All areas not commented on are within normal limits or unremarkable      (Approved Template 04/25/2020)

## 2020-08-29 DIAGNOSIS — L409 Psoriasis, unspecified: Principal | ICD-10-CM

## 2020-09-03 DIAGNOSIS — L409 Psoriasis, unspecified: Principal | ICD-10-CM

## 2020-09-03 MED ORDER — ACITRETIN 25 MG CAPSULE
ORAL_CAPSULE | ORAL | 1 refills | 0.00000 days | Status: CP
Start: 2020-09-03 — End: 2020-10-29

## 2020-09-03 NOTE — Unmapped (Signed)
Refill request for Acitretin   Patient last seen in clinic 08/27/20  Refill sent for your review. Fax was received from pharmacy for Acitretin stating medication comes in capsule and cannot be cut in half please advise.

## 2020-09-04 ENCOUNTER — Telehealth (INDEPENDENT_AMBULATORY_CARE_PROVIDER_SITE_OTHER): Payer: Medicare Other | Admitting: Family Medicine

## 2020-09-04 ENCOUNTER — Encounter: Payer: Self-pay | Admitting: Family Medicine

## 2020-09-04 DIAGNOSIS — J3089 Other allergic rhinitis: Principal | ICD-10-CM

## 2020-09-04 DIAGNOSIS — L4050 Arthropathic psoriasis, unspecified: Principal | ICD-10-CM

## 2020-09-04 DIAGNOSIS — J0100 Acute maxillary sinusitis, unspecified: Secondary | ICD-10-CM

## 2020-09-04 MED ORDER — FLUTICASONE PROPIONATE 50 MCG/ACTUATION NASAL SPRAY,SUSPENSION
Freq: Every day | NASAL | 4 refills | 30 days | Status: CP
Start: 2020-09-04 — End: 2021-09-04
  Filled 2020-09-27: qty 16, 30d supply, fill #0

## 2020-09-04 MED ORDER — METHOTREXATE SODIUM 25 MG/ML INJECTION SOLUTION
SUBCUTANEOUS | 0 refills | 28.00000 days
Start: 2020-09-04 — End: ?

## 2020-09-04 MED ORDER — FLUTICASONE PROPIONATE 50 MCG/ACT NA SUSP: 2.0000 | Freq: Every day | NASAL | 2 refills | Status: DC

## 2020-09-04 MED ORDER — AMOXICILLIN-POT CLAVULANATE 875-125 MG PO TABS: 1.0000 | ORAL_TABLET | Freq: Two times a day (BID) | ORAL | 0 refills | Status: DC

## 2020-09-04 MED ORDER — STIOLTO RESPIMAT 2.5-2.5 MCG/ACT IN AERS: 2.0000 | INHALATION_SPRAY | Freq: Every day | RESPIRATORY_TRACT | 1 refills | Status: DC

## 2020-09-04 MED ORDER — ALBUTEROL SULFATE HFA 108 (90 BASE) MCG/ACT IN AERS
2.0000 | INHALATION_SPRAY | Freq: Four times a day (QID) | RESPIRATORY_TRACT | 3 refills | Status: AC | PRN
Start: 2020-09-04 — End: ?

## 2020-09-04 MED ORDER — MECLIZINE HCL 12.5 MG PO TABS: 12.5000 mg | ORAL_TABLET | Freq: Three times a day (TID) | ORAL | 1 refills | Status: DC | PRN

## 2020-09-04 NOTE — Unmapped (Signed)
Methotrexate refill  Last ov: Visit date not found   Next ov: 01/10/2021     Labs:   AST   Date Value Ref Range Status   05/08/2020 28 0 - 40 IU/L Final     ALT   Date Value Ref Range Status   05/08/2020 25 0 - 32 IU/L Final     Creatinine   Date Value Ref Range Status   02/06/2020 0.72 0.57 - 1.00 mg/dL Final     WBC   Date Value Ref Range Status   05/08/2020 6.6 3.4 - 10.8 x10E3/uL Final     HGB   Date Value Ref Range Status   05/08/2020 13.2 11.1 - 15.9 g/dL Final     HCT   Date Value Ref Range Status   05/08/2020 39.4 34.0 - 46.6 % Final     MCV   Date Value Ref Range Status   05/08/2020 100 (H) 79 - 97 fL Final     RDW   Date Value Ref Range Status   05/08/2020 14.2 11.7 - 15.4 % Final     Platelet   Date Value Ref Range Status   05/08/2020 306 150 - 450 x10E3/uL Final     Neutrophils %   Date Value Ref Range Status   09/10/2019 61.9 % Final     Lymphocytes %   Date Value Ref Range Status   02/06/2020 32 Not Estab. % Final     Monocytes %   Date Value Ref Range Status   02/06/2020 6 Not Estab. % Final     Eosinophils %   Date Value Ref Range Status   02/06/2020 1 Not Estab. % Final     Basophils %   Date Value Ref Range Status   02/06/2020 1 Not Estab. % Final

## 2020-09-04 NOTE — Unmapped (Signed)
The [facility] has made a second and final attempt to reach this patient to refill the following medication:Humira.      We have been unable to leave messages on the following phone numbers: [phone] and have sent a MyChart message.    Dates contacted: 12/16, 12/21  Last scheduled delivery: 12/1    The patient may be at risk of non-compliance with this medication. The patient should call the [facility] at [phone] (option 4) to refill medication.    [name]   [facility] Specialty Technician

## 2020-09-04 NOTE — Unmapped (Addendum)
[facility] Refill Coordination Note    Specialty Medication(s) to be Shipped:   Inflammatory Disorders: Humira and methotrexate (injectable)    Other medication(s) to be shipped: fluticasone, syringes     Michele Miller, DOB: Jan 09, 1961  Phone: [phone] (home)       All above HIPAA information was verified with patient.     Was a Nurse, learning disability used for this call? No    Completed refill call assessment today to schedule patient's medication shipment from the [facility] [phone]).       Specialty medication(s) and dose(s) confirmed: Regimen is correct and unchanged.   Changes to medications: Michele Miller reports no changes at this time.  Changes to insurance: No  Questions for the pharmacist: No    Confirmed patient received Welcome Packet with first shipment. The patient will receive a drug information handout for each medication shipped and additional FDA Medication Guides as required.       DISEASE/MEDICATION-SPECIFIC INFORMATION        For patients on injectable medications: Patient currently has 0 of humira; 2 of methotrexate doses left.  Next injection is scheduled for 12/31 for humira, 12/26 for methotrexate.    SPECIALTY MEDICATION ADHERENCE     Medication Adherence    Patient reported X missed doses in the last month: 0  Specialty Medication: Humira  Patient is on additional specialty medications: Yes  Additional Specialty Medications: Methotrexate  Patient Reported Additional Medication X Missed Doses in the Last Month: 0  Patient is on more than two specialty medications: No  Any gaps in refill history greater than 2 weeks in the last 3 months: no  Demonstrates understanding of importance of adherence: yes  Informant: patient                Humira 40mg /0.21ml: Patient has 0 days of medication on hand    Methotrexate 25mg /ml: Patient has 14 days of medication on hand      SHIPPING     Shipping address confirmed in Epic.     Delivery Scheduled: Yes, Expected medication delivery date: 12/28.  However, Rx request for refills was sent to the provider as there are none remaining.     Medication will be delivered via Same Day Courier to the prescription address in Epic WAM.    Michele Miller   [facility] Specialty Technician

## 2020-09-04 NOTE — Progress Notes (Signed)
There were no vitals taken for this visit.   Subjective:    Patient ID: Emily Johnston, female    DOB: Jul 22, 1961, 59 y.o.   MRN: [id number]  HPI: Emily Johnston is a 58 y.o. female  Chief Complaint  Patient presents with  . Cough    Pt states she has a dry cough, and ear pain, has headache. Patient has been sick for a few weeks    UPPER RESPIRATORY TRACT INFECTION Duration: about 4 weeks Worst symptom: congestion and cough Fever: no Cough: yes Shortness of breath: no Wheezing: no Chest pain: no Chest tightness: no Chest congestion: no Nasal congestion: yes Runny nose: no Post nasal drip: no Sneezing: no Sore throat: yes Swollen glands: no Sinus pressure: yes Headache: yes Face pain: no Toothache: no Ear pain: yes  Ear pressure: yes bilateral Eyes red/itching:no Eye drainage/crusting: no  Vomiting: no Rash: no Fatigue: yes Sick contacts: no Strep contacts: no  Context: stable Recurrent sinusitis: no Relief with OTC cold/cough medications: no  Treatments attempted: nyquil, dayquil, mucinex   Relevant past medical, surgical, family and social history reviewed and updated as indicated. Interim medical history since our last visit reviewed. Allergies and medications reviewed and updated.  Review of Systems  Constitutional: Positive for diaphoresis and fatigue. Negative for activity change, appetite change, chills, fever and unexpected weight change.  HENT: Positive for congestion, ear pain, postnasal drip, rhinorrhea and sinus pain. Negative for dental problem, drooling, ear discharge, facial swelling, hearing loss, mouth sores, nosebleeds, sinus pressure, sneezing, sore throat, tinnitus, trouble swallowing and voice change.   Eyes: Negative.   Respiratory: Positive for cough and shortness of breath. Negative for apnea, choking, chest tightness, wheezing and stridor.   Cardiovascular: Negative.   Gastrointestinal: Negative.   Musculoskeletal:  Negative.   Psychiatric/Behavioral: Negative.     Per HPI unless specifically indicated above     Objective:    There were no vitals taken for this visit.  Wt Readings from Last 3 Encounters:  08/13/20 126 lb 9.6 oz (57.4 kg)  08/01/20 128 lb (58.1 kg)  07/10/20 129 lb 9.6 oz (58.8 kg)    Physical Exam Vitals and nursing note reviewed.  Constitutional:      General: She is not in acute distress.    Appearance: Normal appearance. She is not ill-appearing, toxic-appearing or diaphoretic.  HENT:     Head: Normocephalic and atraumatic.     Right Ear: External ear normal.     Left Ear: External ear normal.     Nose: Nose normal.     Mouth/Throat:     Mouth: Mucous membranes are moist.     Pharynx: Oropharynx is clear.  Eyes:     General: No scleral icterus.       Right eye: No discharge.        Left eye: No discharge.     Conjunctiva/sclera: Conjunctivae normal.     Pupils: Pupils are equal, round, and reactive to light.  Pulmonary:     Effort: Pulmonary effort is normal. No respiratory distress.     Comments: Speaking in full sentences Musculoskeletal:        General: Normal range of motion.     Cervical back: Normal range of motion.  Skin:    Coloration: Skin is not jaundiced or pale.     Findings: No bruising, erythema, lesion or rash.  Neurological:     Mental Status: She is alert and oriented to person, place, and time. Mental  status is at baseline.  Psychiatric:        Mood and Affect: Mood normal.        Behavior: Behavior normal.        Thought Content: Thought content normal.        Judgment: Judgment normal.     Results for orders placed or performed in visit on 08/13/20  Novel Coronavirus, NAA ([facility])   Specimen: Saline  Result Value Ref Range   SARS-CoV-2, NAA Not Detected Not Detected  SARS-COV-2, NAA 2 DAY TAT  Result Value Ref Range   SARS-CoV-2, NAA 2 DAY TAT Performed       Assessment & Plan:   Problem List Items Addressed This Visit    None   Visit Diagnoses    Acute non-recurrent maxillary sinusitis    -  Primary   Will treat with augmentin as she has been sick for about a month. Will get her back for in person evaluation over the next couple of weeks. Call with concerns.    Relevant Medications   fluticasone (FLONASE) 50 MCG/ACT nasal spray   amoxicillin-clavulanate (AUGMENTIN) 875-125 MG tablet       Follow up plan: Return in about 2 weeks (around 09/18/2020) for follow up in person with [name].   . This visit was completed via MyChart due to the restrictions of the COVID-19 pandemic. All issues as above were discussed and addressed. Physical exam was done as above through visual confirmation on MyChart. If it was felt that the patient should be evaluated in the office, they were directed there. The patient verbally consented to this visit. . Location of the patient: home . Location of the provider: work . Those involved with this call:  . Provider: Park Liter, DO . CMA: Louanna Raw, [place] . Front Desk/Registration: Jill Side  . Time spent on call: 15 minutes with patient face to face via video conference. More than 50% of this time was spent in counseling and coordination of care. 23 minutes total spent in review of patient's record and preparation of their chart.

## 2020-09-06 MED ORDER — METHOTREXATE SODIUM 25 MG/ML INJECTION SOLUTION
SUBCUTANEOUS | 0 refills | 28.00000 days | Status: CP
Start: 2020-09-06 — End: 2020-10-15

## 2020-09-10 NOTE — Unmapped (Signed)
Pt LVM stating she had neck surgery in January. She had a fall on Monday from the toilet seat. She wants to be seen by Dr Jaynie Collins to get checked out.      RN attempted to return call x2 .. pt has no voicemail set up.

## 2020-09-10 NOTE — Unmapped (Signed)
09/10/20 Contacted pt to schedule follow up with Dr. Jaynie Collins in January. No answer, pt does not have VM set up.

## 2020-09-11 MED FILL — BD TUBERCULIN SYRINGE 1 ML 27 X 1/2": 28 days supply | Qty: 4 | Fill #7 | Status: AC

## 2020-09-11 MED FILL — BD TUBERCULIN SYRINGE 1 ML 27 X 1/2": 28 days supply | Qty: 4 | Fill #7

## 2020-09-11 MED FILL — HUMIRA PEN CITRATE FREE 40 MG/0.4 ML: 28 days supply | Qty: 2 | Fill #0 | Status: AC

## 2020-09-11 MED FILL — METHOTREXATE SODIUM 25 MG/ML INJECTION SOLUTION: SUBCUTANEOUS | 28 days supply | Qty: 4 | Fill #0

## 2020-09-11 MED FILL — METHOTREXATE SODIUM 25 MG/ML INJECTION SOLUTION: 28 days supply | Qty: 4 | Fill #0 | Status: AC

## 2020-09-11 NOTE — Unmapped (Addendum)
RN attempted to return call to pt... pt has voicemail box that has not been set up.

## 2020-09-11 NOTE — Unmapped (Signed)
Pt LVM stating she is returning a call at 1126AM.    Pt states she had a fall on Monday.  Needs to follow up with Dr Jaynie Collins 1 year after surgery.    RN returned pt's call. Attempted to contact pt not answering her phone. No voicemail set up.

## 2020-09-19 ENCOUNTER — Other Ambulatory Visit: Payer: Self-pay

## 2020-09-19 ENCOUNTER — Ambulatory Visit (INDEPENDENT_AMBULATORY_CARE_PROVIDER_SITE_OTHER): Payer: Medicare Other | Admitting: Nurse Practitioner

## 2020-09-19 ENCOUNTER — Encounter: Payer: Self-pay | Admitting: Nurse Practitioner

## 2020-09-19 VITALS — BP 132/83 | HR 74 | Temp 98.2°F | Ht 63.7 in | Wt 131.6 lb

## 2020-09-19 DIAGNOSIS — F332 Major depressive disorder, recurrent severe without psychotic features: Secondary | ICD-10-CM

## 2020-09-19 DIAGNOSIS — F4310 Post-traumatic stress disorder, unspecified: Secondary | ICD-10-CM

## 2020-09-19 DIAGNOSIS — H6993 Unspecified Eustachian tube disorder, bilateral: Secondary | ICD-10-CM | POA: Diagnosis not present

## 2020-09-19 DIAGNOSIS — H6690 Otitis media, unspecified, unspecified ear: Secondary | ICD-10-CM | POA: Insufficient documentation

## 2020-09-19 MED ORDER — PREDNISONE 10 MG PO TABS: ORAL_TABLET | ORAL | 0 refills | Status: DC

## 2020-09-19 NOTE — Patient Instructions (Signed)

## 2020-09-19 NOTE — Assessment & Plan Note (Signed)
Chronic, ongoing.  Continue current medication regimen and collaboration with psychiatry + therapy sessions, which patient has reported benefit from.  Denies SI/HI.

## 2020-09-19 NOTE — Assessment & Plan Note (Signed)
Chronic, ongoing.  Continue current medication regimen and collaboration with psychiatry and therapist. Denies SI/HI.  She is aware all psychiatric medications are to be filled by her psychiatrist and she needs to ensure continued care with them.  Advised her to reach out to them and discuss concerns with her medication changes.

## 2020-09-19 NOTE — Assessment & Plan Note (Signed)
No signs of infection on exam, but continues to have fluid noted.  Will send in lengthier Prednisone taper to see if benefit.  Has completed abx course.  For worsening or ongoing symptoms return to office.

## 2020-09-19 NOTE — Progress Notes (Signed)
BP 132/83   Pulse 74   Temp 98.2 F (36.8 C) (Oral)   Ht 5' 3.7" (1.618 m)   Wt 131 lb 9.6 oz (59.7 kg)   SpO2 98%   BMI 22.80 kg/m    Subjective:    Patient ID: Emily Johnston, female    DOB: 1961/05/18, 60 y.o.   MRN: [id number]  HPI: Emily Johnston is a 60 y.o. female  Chief Complaint  Patient presents with  . Sinusitis    F/U from 2 weeks ago for a sinus infection. Patient feels like the sinus infection is getting better but now her ears feel full and feeling dizzy for the last week or so.  . Medication Problem    Wants to talk to you about medication that was discontinued by a different doctor and it is driving her crazy and almost had a panic attack over the holidays. Patient has not been sleeping much since her medication has been changed.   SINUSITIS Treated on 09/04/20 for sinusitis with Amoxicillin and Prednisone burst.  Reports overall improvement with exception of ear fullness and dizziness -- reports she took bad fall 3 weeks ago and she is seeing spine clinic upcoming at [facility] and is requesting MRI.  Covid was negative.  Reports some greenish phlegm still, taking Mucinex and Dayquil.   Fever: no Cough: yes Shortness of breath: no Wheezing: no Chest pain: no Chest tightness: no Chest congestion: no Nasal congestion: yes Runny nose: yes Post nasal drip: yes Sneezing: no Sore throat: no Swollen glands: no Sinus pressure: no Headache: no Face pain: no Toothache: no Ear pain: no none Ear pressure: yes bilateral Eyes red/itching:no Eye drainage/crusting: no  Vomiting: no Rash: no Fatigue: no Context: better Recurrent sinusitis: no Treatments attempted: Amoxicillin and Prednisone burst.  DEPRESSION Currently followed by psychiatry team, [facility] in [place]. Reports Durenda Age her previous physician left and went to [facility], now seeing new physician there who she has poor rapport there. Continues on Cymbalta, Vyvanse, and Klonopin  -- reports that provider took her off Adderall and this has caused issues, states Vyvanse is not working as well.  She is going to try to reach out to [place] who is now at [facility] and discuss. Is being followed by therapy too, which she reports benefit from and good relationship with. Mood status:stable Satisfied with current treatment?:yes Symptom severity:moderate Duration of current treatment :chronic Side effects:no Medication compliance:good compliance Psychotherapy/counseling:yescurrent Depressed mood:yes Anxious mood:yes Anhedonia:no Significant weight loss or gain:no Insomnia:yeshard to stay asleep Fatigue:no Feelings of worthlessness or guilt:no Impaired concentration/indecisiveness:yes Suicidal ideations:no Hopelessness:no Crying spells:no Depression screen [facility] 2/9 09/19/2020 08/13/2020 07/10/2020 05/07/2020 10/24/2019  Decreased Interest 3 3 1 1 2   Down, Depressed, Hopeless 3 3 1 1 3   PHQ - 2 Score 6 6 2 2 5   Altered sleeping 3 2 2  0 1  Tired, decreased energy 2 3 0 1 1  Change in appetite 2 3 1 1 1   Feeling bad or failure about yourself  2 2 1  0 1  Trouble concentrating - 2 0 1 1  Moving slowly or fidgety/restless 0 3 0 0 0  Suicidal thoughts 1 0 0 0 0  PHQ-9 Score 16 21 6 5 10   Difficult doing work/chores - Extremely dIfficult Not difficult at all Not difficult at all Somewhat difficult  Some recent data might be hidden   Relevant past medical, surgical, family and social history reviewed and updated as indicated. Interim medical history since our last  visit reviewed. Allergies and medications reviewed and updated.  Review of Systems  Constitutional: Negative for activity change, appetite change, diaphoresis, fatigue and fever.  HENT: Positive for congestion, postnasal drip and rhinorrhea. Negative for ear discharge, ear pain, sinus pressure, sinus pain, sore throat and voice change.   Respiratory: Positive for cough. Negative for shortness of breath  and wheezing.   Cardiovascular: Negative.   Gastrointestinal: Negative.   Neurological: Positive for dizziness. Negative for seizures, syncope, speech difficulty, weakness, numbness and headaches.  Psychiatric/Behavioral: Positive for decreased concentration. Negative for confusion, self-injury, sleep disturbance and suicidal ideas. The patient is nervous/anxious.     Per HPI unless specifically indicated above     Objective:    BP 132/83   Pulse 74   Temp 98.2 F (36.8 C) (Oral)   Ht 5' 3.7" (1.618 m)   Wt 131 lb 9.6 oz (59.7 kg)   SpO2 98%   BMI 22.80 kg/m   Wt Readings from Last 3 Encounters:  09/19/20 131 lb 9.6 oz (59.7 kg)  08/13/20 126 lb 9.6 oz (57.4 kg)  08/01/20 128 lb (58.1 kg)    Physical Exam Vitals and nursing note reviewed.  Constitutional:      General: She is awake. She is not in acute distress.    Appearance: She is well-developed and well-groomed. She is not ill-appearing.  HENT:     Head: Normocephalic.     Right Ear: Hearing, ear canal and external ear normal. A middle ear effusion is present.     Left Ear: Hearing, ear canal and external ear normal. A middle ear effusion is present.     Nose: Nose normal.     Right Sinus: No maxillary sinus tenderness or frontal sinus tenderness.     Left Sinus: No maxillary sinus tenderness or frontal sinus tenderness.     Mouth/Throat:     Mouth: Mucous membranes are moist.     Pharynx: Oropharynx is clear.  Eyes:     General: Lids are normal.        Right eye: No discharge.        Left eye: No discharge.     Conjunctiva/sclera: Conjunctivae normal.     Pupils: Pupils are equal, round, and reactive to light.  Neck:     Thyroid: No thyromegaly.     Vascular: No carotid bruit or JVD.  Cardiovascular:     Rate and Rhythm: Normal rate and regular rhythm.     Heart sounds: Normal heart sounds. No murmur heard. No gallop.   Pulmonary:     Effort: Pulmonary effort is normal.     Breath sounds: Normal breath  sounds.  Abdominal:     General: Bowel sounds are normal.     Palpations: Abdomen is soft.  Musculoskeletal:     Cervical back: Normal range of motion and neck supple.     Right lower leg: No edema.     Left lower leg: No edema.  Lymphadenopathy:     Cervical: No cervical adenopathy.  Skin:    General: Skin is warm and dry.  Neurological:     Mental Status: She is alert and oriented to person, place, and time.  Psychiatric:        Attention and Perception: Attention normal.        Mood and Affect: Affect is tearful.        Speech: Speech normal.        Behavior: Behavior normal. Behavior is cooperative.  Thought Content: Thought content normal.    Results for orders placed or performed in visit on 08/13/20  Novel Coronavirus, NAA ([facility])   Specimen: Saline  Result Value Ref Range   SARS-CoV-2, NAA Not Detected Not Detected  SARS-COV-2, NAA 2 DAY TAT  Result Value Ref Range   SARS-CoV-2, NAA 2 DAY TAT Performed       Assessment & Plan:   Problem List Items Addressed This Visit      Nervous and Auditory   Eustachian tube disorder, bilateral    No signs of infection on exam, but continues to have fluid noted.  Will send in lengthier Prednisone taper to see if benefit.  Has completed abx course.  For worsening or ongoing symptoms return to office.        Other   Severe recurrent major depression without psychotic features ([place]) - Primary    Chronic, ongoing.  Continue current medication regimen and collaboration with psychiatry and therapist. Denies SI/HI.  She is aware all psychiatric medications are to be filled by her psychiatrist and she needs to ensure continued care with them.  Advised her to reach out to them and discuss concerns with her medication changes.      PTSD (post-traumatic stress disorder)    Chronic, ongoing.  Continue current medication regimen and collaboration with psychiatry + therapy sessions, which patient has reported benefit from.  Denies  SI/HI.          Follow up plan: Return for as scheduled in February.

## 2020-09-21 ENCOUNTER — Other Ambulatory Visit: Payer: Self-pay | Admitting: Nurse Practitioner

## 2020-09-21 DIAGNOSIS — F4310 Post-traumatic stress disorder, unspecified: Secondary | ICD-10-CM

## 2020-09-21 DIAGNOSIS — F332 Major depressive disorder, recurrent severe without psychotic features: Secondary | ICD-10-CM

## 2020-09-21 NOTE — Unmapped (Signed)
[facility] Refill Coordination Note    Patient had a Humira pen misfire    Specialty Medication(s) to be Shipped:   Inflammatory Disorders: Humira    Other medication(s) to be shipped: No additional medications requested for fill at this time     Michele Miller, DOB: 1961/05/03  Phone: [phone] (home)       All above HIPAA information was verified with patient.     Was a Nurse, learning disability used for this call? No    Completed refill call assessment today to schedule patient's medication shipment from the [facility] [phone]).       Specialty medication(s) and dose(s) confirmed: Regimen is correct and unchanged.   Changes to medications: [name] reports no changes at this time.  Changes to insurance: No  Questions for the pharmacist: No    Confirmed patient received Welcome Packet with first shipment. The patient will receive a drug information handout for each medication shipped and additional FDA Medication Guides as required.       DISEASE/MEDICATION-SPECIFIC INFORMATION        For patients on injectable medications: Patient currently has 0 doses left.  Next injection is scheduled for 09/27/2020.    SPECIALTY MEDICATION ADHERENCE     Medication Adherence    Patient reported X missed doses in the last month: 0  Specialty Medication: Humira  Patient is on additional specialty medications: No  Any gaps in refill history greater than 2 weeks in the last 3 months: no  Demonstrates understanding of importance of adherence: yes  Informant: patient  Reliability of informant: reliable  Confirmed plan for next specialty medication refill: delivery by pharmacy  Refills needed for supportive medications: not needed                Humira 40mg /0.40ml: Patient has 0 days of medication on hand          SHIPPING     Shipping address confirmed in Epic.     Delivery Scheduled: Yes, Expected medication delivery date: 09/26/2020.     Medication will be delivered via Same Day Courier to the prescription address in Epic WAM.    Sherena Machorro D Lateisha Thurlow   [facility] Specialty Technician

## 2020-09-24 ENCOUNTER — Ambulatory Visit: Admit: 2020-09-24 | Discharge: 2020-09-25 | Payer: MEDICARE

## 2020-09-24 ENCOUNTER — Encounter
Admit: 2020-09-24 | Discharge: 2020-09-25 | Payer: MEDICARE | Attending: Orthopaedic Surgery | Primary: Orthopaedic Surgery

## 2020-09-24 DIAGNOSIS — M47819 Spondylosis without myelopathy or radiculopathy, site unspecified: Principal | ICD-10-CM

## 2020-09-24 DIAGNOSIS — F17210 Nicotine dependence, cigarettes, uncomplicated: Principal | ICD-10-CM

## 2020-09-24 DIAGNOSIS — F4310 Post-traumatic stress disorder, unspecified: Principal | ICD-10-CM

## 2020-09-24 DIAGNOSIS — G8929 Other chronic pain: Principal | ICD-10-CM

## 2020-09-24 DIAGNOSIS — Z981 Arthrodesis status: Principal | ICD-10-CM

## 2020-09-24 DIAGNOSIS — M5031 Other cervical disc degeneration,  high cervical region: Principal | ICD-10-CM

## 2020-09-24 DIAGNOSIS — S32000A Wedge compression fracture of unspecified lumbar vertebra, initial encounter for closed fracture: Principal | ICD-10-CM

## 2020-09-24 DIAGNOSIS — G5600 Carpal tunnel syndrome, unspecified upper limb: Principal | ICD-10-CM

## 2020-09-24 DIAGNOSIS — Z87828 Personal history of other (healed) physical injury and trauma: Principal | ICD-10-CM

## 2020-09-24 DIAGNOSIS — F329 Major depressive disorder, single episode, unspecified: Principal | ICD-10-CM

## 2020-09-24 DIAGNOSIS — J449 Chronic obstructive pulmonary disease, unspecified: Principal | ICD-10-CM

## 2020-09-24 DIAGNOSIS — M5412 Radiculopathy, cervical region: Principal | ICD-10-CM

## 2020-09-24 DIAGNOSIS — Z79899 Other long term (current) drug therapy: Principal | ICD-10-CM

## 2020-09-24 DIAGNOSIS — M545 Low back pain without sciatica, unspecified back pain laterality, unspecified chronicity: Principal | ICD-10-CM

## 2020-09-24 DIAGNOSIS — E785 Hyperlipidemia, unspecified: Principal | ICD-10-CM

## 2020-09-24 DIAGNOSIS — L4050 Arthropathic psoriasis, unspecified: Principal | ICD-10-CM

## 2020-09-24 DIAGNOSIS — M5010 Cervical disc disorder with radiculopathy, unspecified cervical region: Secondary | ICD-10-CM | POA: Diagnosis not present

## 2020-09-24 MED ORDER — METHOCARBAMOL 500 MG TABLET
ORAL_TABLET | Freq: Three times a day (TID) | ORAL | 3 refills | 20 days | Status: CP | PRN
Start: 2020-09-24 — End: ?
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
## 2020-09-24 NOTE — Unmapped (Signed)
Your diagnosis: Low back pain worsened after fall    Recommendations/Plan  ?? Medications: We have prescribed Robaxin (refill)  ?? Activity: Activities as tolerated.  ?? Imaging studies: MRI of the Lumbosacral spine was ordered. Medical necessity: Radiographs have been performed/ordered. We suspect possible fracture.  ?? Conservative Care: Let's continue to monitor your symptoms.    ?? Follow-up: After imaging studies are completed.  ?? Video visit    Contact our nursing team via MyChart or [phone] with any clinical questions/concerns.  Our scheduling team can be reached at [phone].

## 2020-09-26 MED FILL — HUMIRA PEN CITRATE FREE 40 MG/0.4 ML: SUBCUTANEOUS | 28 days supply | Qty: 2 | Fill #1

## 2020-10-03 ENCOUNTER — Ambulatory Visit (INDEPENDENT_AMBULATORY_CARE_PROVIDER_SITE_OTHER): Payer: Medicare Other | Admitting: Nurse Practitioner

## 2020-10-03 ENCOUNTER — Other Ambulatory Visit: Payer: Self-pay

## 2020-10-03 ENCOUNTER — Ambulatory Visit: Payer: Medicare Other | Admitting: Podiatry

## 2020-10-03 ENCOUNTER — Telehealth: Payer: Self-pay

## 2020-10-03 ENCOUNTER — Encounter: Payer: Self-pay | Admitting: Nurse Practitioner

## 2020-10-03 DIAGNOSIS — H66003 Acute suppurative otitis media without spontaneous rupture of ear drum, bilateral: Secondary | ICD-10-CM

## 2020-10-03 MED ORDER — AMOXICILLIN-POT CLAVULANATE 875-125 MG PO TABS: 1.0000 | ORAL_TABLET | Freq: Two times a day (BID) | ORAL | 0 refills | Status: DC

## 2020-10-03 MED ORDER — CIPROFLOXACIN-DEXAMETHASONE 0.3-0.1 % OT SUSP: 4.0000 [drp] | Freq: Two times a day (BID) | OTIC | 0 refills | Status: DC

## 2020-10-03 NOTE — Telephone Encounter (Signed)
Yes, may return to office for visit to recheck ear if ongoing issues.

## 2020-10-03 NOTE — Telephone Encounter (Signed)
Copied from [place] [phone]. Topic: Quick Communication - See Telephone Encounter >> Oct 03, 2020 10:48 AM Loma Boston wrote: CRM for notification. See Telephone encounter for: 10/03/20. Pt is needing an appt for the fluid in her ears, the med do not seem to be helping. Call her about [name] approving her to -come in the office  to have ears looked at. No other issues, just ears?--[phone]

## 2020-10-03 NOTE — Assessment & Plan Note (Signed)
Acute noted to bilateral ears with R>L on exam + mild erythema in canals.  At this time will send in Augmentin, has tolerated in past without issue.  Discussed with patient as she does take Methotrexate, will only take abx for short course and to monitor closely.  Script for Augmentin and for Ciprodex ear gtts sent.  Concern for rupture if ongoing, have recommended Tylenol as needed for pain and monitor symptoms closely.  Will have return in one week for recheck, sooner if any worsening symptoms.

## 2020-10-03 NOTE — Patient Instructions (Signed)
Otitis Media, Adult  Otitis media is a condition in which the middle ear is red and swollen (inflamed) and full of fluid. The middle ear is the part of the ear that contains bones for hearing as well as air that helps send sounds to the brain. The condition usually goes away on its own. What are the causes? This condition is caused by a blockage in the eustachian tube. The eustachian tube connects the middle ear to the back of the nose. It normally allows air into the middle ear. The blockage is caused by fluid or swelling. Problems that can cause blockage include:  A cold or infection that affects the nose, mouth, or throat.  Allergies.  An irritant, such as tobacco smoke.  Adenoids that have become large. The adenoids are soft tissue located in the back of the throat, behind the nose and the roof of the mouth.  Growth or swelling in the upper part of the throat, just behind the nose (nasopharynx).  Damage to the ear caused by change in pressure. This is called barotrauma. What are the signs or symptoms? Symptoms of this condition include:  Ear pain.  Fever.  Problems with hearing.  Being tired.  Fluid leaking from the ear.  Ringing in the ear. How is this treated? This condition can go away on its own within 3-5 days. But if the condition is caused by bacteria or does not go away on its own, or if it keeps coming back, your doctor may:  Give you antibiotic medicines.  Give you medicines for pain. Follow these instructions at home:  Take over-the-counter and prescription medicines only as told by your doctor.  If you were prescribed an antibiotic medicine, take it as told by your doctor. Do not stop taking the antibiotic even if you start to feel better.  Keep all follow-up visits as told by your doctor. This is important. Contact a doctor if:  You have bleeding from your nose.  There is a lump on your neck.  You are not feeling better in 5 days.  You feel worse  instead of better. Get help right away if:  You have pain that is not helped with medicine.  You have swelling, redness, or pain around your ear.  You get a stiff neck.  You cannot move part of your face (paralysis).  You notice that the bone behind your ear hurts when you touch it.  You get a very bad headache. Summary  Otitis media means that the middle ear is red, swollen, and full of fluid.  This condition usually goes away on its own.  If the problem does not go away, treatment may be needed. You may be given medicines to treat the infection or to treat your pain.  If you were prescribed an antibiotic medicine, take it as told by your doctor. Do not stop taking the antibiotic even if you start to feel better.  Keep all follow-up visits as told by your doctor. This is important. This information is not intended to replace advice given to you by your health care provider. Make sure you discuss any questions you have with your health care provider. Document Revised: 08/04/2019 Document Reviewed: 08/04/2019 Elsevier Patient Education  2021 [employer] Inc.  

## 2020-10-03 NOTE — Telephone Encounter (Signed)
Scheduled for today.

## 2020-10-03 NOTE — Progress Notes (Signed)
BP 130/87   Pulse 82   Temp 98.2 F (36.8 C)   Wt 130 lb (59 kg)   SpO2 96%   BMI 22.52 kg/m    Subjective:    Patient ID: Emily Johnston, female    DOB: 1960/11/10, 60 y.o.   MRN: [id number]  HPI: DALYA Johnston is a 60 y.o. female  Chief Complaint  Patient presents with  . Ear Pain    Patient states she has fluid behind her ear that she believes has not cleared up it cause her ear pain. Patient states it is making her dizzy and nauseous.    EAR PAIN Had Prednisone taper sent 09/19/20 for extended period without relief.  Uses Flonase daily all allergy medications.  Last treated with abx in December with Augmentin -- which tolerated well and helped with sinus issues.   Duration: weeks Involved ear(s): bilateral -- L>R Severity: 8/10 Quality:  Dull and aching Fever: no Otorrhea: no Upper respiratory infection symptoms: no Pruritus: no Hearing loss: no Water immersion no Using Q-tips: no Recurrent otitis media: no Status: fluctuating Treatments attempted: as above   Relevant past medical, surgical, family and social history reviewed and updated as indicated. Interim medical history since our last visit reviewed. Allergies and medications reviewed and updated.  Review of Systems  Constitutional: Negative for activity change, appetite change, diaphoresis, fatigue and fever.  HENT: Positive for ear pain. Negative for congestion, ear discharge, postnasal drip, rhinorrhea, sinus pressure, sinus pain, sore throat and voice change.   Respiratory: Negative for cough, shortness of breath and wheezing.   Cardiovascular: Negative.   Gastrointestinal: Negative.   Neurological: Positive for dizziness. Negative for seizures, syncope, speech difficulty, weakness, numbness and headaches.  Psychiatric/Behavioral: Negative for confusion, decreased concentration, self-injury, sleep disturbance and suicidal ideas. The patient is not nervous/anxious.     Per HPI unless  specifically indicated above     Objective:    BP 130/87   Pulse 82   Temp 98.2 F (36.8 C)   Wt 130 lb (59 kg)   SpO2 96%   BMI 22.52 kg/m   Wt Readings from Last 3 Encounters:  10/03/20 130 lb (59 kg)  09/19/20 131 lb 9.6 oz (59.7 kg)  08/13/20 126 lb 9.6 oz (57.4 kg)    Physical Exam Vitals and nursing note reviewed.  Constitutional:      General: She is awake. She is not in acute distress.    Appearance: She is well-developed and well-groomed. She is not ill-appearing.  HENT:     Head: Normocephalic.     Right Ear: Hearing and external ear normal. No drainage. There is no impacted cerumen. Tympanic membrane is bulging.     Left Ear: Hearing and external ear normal. No drainage. There is no impacted cerumen. Tympanic membrane is bulging.     Ears:     Comments: Bulging TM bilaterally with cloudy appearance, poor view of bony landmarks -- R>L.  Canals bilaterally with mild erythema noted.    Nose: Nose normal.     Right Sinus: No maxillary sinus tenderness or frontal sinus tenderness.     Left Sinus: No maxillary sinus tenderness or frontal sinus tenderness.     Mouth/Throat:     Mouth: Mucous membranes are moist.     Pharynx: Oropharynx is clear.  Eyes:     General: Lids are normal.        Right eye: No discharge.        Left eye:  No discharge.     Conjunctiva/sclera: Conjunctivae normal.     Pupils: Pupils are equal, round, and reactive to light.  Neck:     Thyroid: No thyromegaly.     Vascular: No carotid bruit or JVD.  Cardiovascular:     Rate and Rhythm: Normal rate and regular rhythm.     Heart sounds: Normal heart sounds. No murmur heard. No gallop.   Pulmonary:     Effort: Pulmonary effort is normal.     Breath sounds: Normal breath sounds.  Abdominal:     General: Bowel sounds are normal.     Palpations: Abdomen is soft.  Musculoskeletal:     Cervical back: Normal range of motion and neck supple.     Right lower leg: No edema.     Left lower leg: No  edema.  Lymphadenopathy:     Cervical: No cervical adenopathy.  Skin:    General: Skin is warm and dry.  Neurological:     Mental Status: She is alert and oriented to person, place, and time.  Psychiatric:        Attention and Perception: Attention normal.        Mood and Affect: Affect is tearful.        Speech: Speech normal.        Behavior: Behavior normal. Behavior is cooperative.        Thought Content: Thought content normal.    Results for orders placed or performed in visit on 08/13/20  Novel Coronavirus, NAA ([facility])   Specimen: Saline  Result Value Ref Range   SARS-CoV-2, NAA Not Detected Not Detected  SARS-COV-2, NAA 2 DAY TAT  Result Value Ref Range   SARS-CoV-2, NAA 2 DAY TAT Performed       Assessment & Plan:   Problem List Items Addressed This Visit      Nervous and Auditory   Otitis media    Acute noted to bilateral ears with R>L on exam + mild erythema in canals.  At this time will send in Augmentin, has tolerated in past without issue.  Discussed with patient as she does take Methotrexate, will only take abx for short course and to monitor closely.  Script for Augmentin and for Ciprodex ear gtts sent.  Concern for rupture if ongoing, have recommended Tylenol as needed for pain and monitor symptoms closely.  Will have return in one week for recheck, sooner if any worsening symptoms.      Relevant Medications   amoxicillin-clavulanate (AUGMENTIN) 875-125 MG tablet       Follow up plan: Return in about 1 week (around 10/10/2020) for Ear check.

## 2020-10-10 ENCOUNTER — Encounter: Payer: Self-pay | Admitting: Family Medicine

## 2020-10-10 ENCOUNTER — Ambulatory Visit (INDEPENDENT_AMBULATORY_CARE_PROVIDER_SITE_OTHER): Payer: Medicare Other | Admitting: Family Medicine

## 2020-10-10 ENCOUNTER — Other Ambulatory Visit: Payer: Self-pay

## 2020-10-10 VITALS — BP 121/85 | HR 81 | Temp 98.6°F | Wt 130.0 lb

## 2020-10-10 DIAGNOSIS — H66003 Acute suppurative otitis media without spontaneous rupture of ear drum, bilateral: Secondary | ICD-10-CM | POA: Diagnosis not present

## 2020-10-10 DIAGNOSIS — M546 Pain in thoracic spine: Secondary | ICD-10-CM

## 2020-10-10 MED ORDER — BACLOFEN 10 MG PO TABS: 10.0000 mg | ORAL_TABLET | Freq: Two times a day (BID) | ORAL | 0 refills | Status: DC | PRN

## 2020-10-10 NOTE — Patient Instructions (Signed)
It was great to see you!  Our plans for today:  - Your ears look great! Keep taking the flonase. - Take the muscle relaxer for your back pain. Try the heating pad before stretching to help loosen muscles.   Take care and seek immediate care sooner if you develop any concerns.   Dr. Ky Barban

## 2020-10-10 NOTE — Assessment & Plan Note (Signed)
Resolved s/p abx treatment. Continue flonase.

## 2020-10-10 NOTE — Progress Notes (Signed)
° °  SUBJECTIVE:   CHIEF COMPLAINT / HPI:   Patient Active Problem List   Diagnosis Date Noted   Back pain 10/11/2020   Otitis media 09/19/2020   Plantar fasciitis 07/10/2020   Basal cell carcinoma (BCC) 07/10/2020   Spondylosis of cervical spine with myelopathy and radiculopathy 10/24/2019   Aortic atherosclerosis ([place]) 06/15/2019   Psoriatic arthritis ([place]) 03/22/2019   Centrilobular emphysema ([place]) 02/22/2019   Nicotine dependence, cigarettes, w unsp disorders 02/22/2019   Chronic idiopathic constipation 11/01/2018   Severe recurrent major depression without psychotic features ([place]) 01/12/2018   PTSD (post-traumatic stress disorder) 01/12/2018   Fibromyalgia 01/12/2018   EAR PAIN - seen previously 1/19 for L>R ear pain, refractory to prednisone taper. B/l AOM on exam, rx Augmentin and ciprodex.  - better but not quite there yet. Still feels some fluid behind her ear, R>L. - using flonase every day.  Fever: no Otorrhea: no Upper respiratory infection symptoms: no Pruritus: no Hearing loss: unsure Using Q-tips: no Recurrent otitis media: no Status: better  BACK PAIN Duration: 1 week Mechanism of injury: lifting Location: Right and upper back Severity: mild Quality: feels like a knot Frequency: constant Radiation: none Aggravating factors: none Alleviating factors: nothing Status: stable Treatments attempted: hot tub, lidocaine patch  Relief with NSAIDs?: no Paresthesias / decreased sensation:  no Bowel / bladder incontinence:  no Fevers:  no   OBJECTIVE:   BP 121/85    Pulse 81    Temp 98.6 F (37 C)    Wt 130 lb (59 kg)    SpO2 97%    BMI 22.52 kg/m   Gen: well appearing, in NAD HEENT: TMs visible b/l with no bulging, purulence, erythema. Canals clear b/l without erythema, discharge, or irritation MSK: no midline spinal tenderness. TTP with hypertonicity of R upper thoracic musculature. Intact ROM and sensation.   ASSESSMENT/PLAN:   Otitis  media Resolved s/p abx treatment. Continue flonase.  Back pain Acute, MSK etiology. R sided thoracic back pain after lifting mom with hypertonicity on exam. No red flags on history or exam. Refractory to robaxin, will trial baclofen, advised not to take together. Apply heat prn. Stretches provided. F/u if no better.    Myles Gip, DO

## 2020-10-11 DIAGNOSIS — M549 Dorsalgia, unspecified: Secondary | ICD-10-CM | POA: Insufficient documentation

## 2020-10-11 NOTE — Assessment & Plan Note (Signed)
Acute, MSK etiology. R sided thoracic back pain after lifting mom with hypertonicity on exam. No red flags on history or exam. Refractory to robaxin, will trial baclofen, advised not to take together. Apply heat prn. Stretches provided. F/u if no better.

## 2020-10-15 DIAGNOSIS — L4050 Arthropathic psoriasis, unspecified: Principal | ICD-10-CM

## 2020-10-15 NOTE — Unmapped (Signed)
Methotrexate refill  Last ov: Visit date not found   Next ov: 01/10/2021     Labs:   AST   Date Value Ref Range Status   05/08/2020 28 0 - 40 IU/L Final     ALT   Date Value Ref Range Status   05/08/2020 25 0 - 32 IU/L Final     Creatinine   Date Value Ref Range Status   02/06/2020 0.72 0.57 - 1.00 mg/dL Final     WBC   Date Value Ref Range Status   05/08/2020 6.6 3.4 - 10.8 x10E3/uL Final     HGB   Date Value Ref Range Status   05/08/2020 13.2 11.1 - 15.9 g/dL Final     HCT   Date Value Ref Range Status   05/08/2020 39.4 34.0 - 46.6 % Final     MCV   Date Value Ref Range Status   05/08/2020 100 (H) 79 - 97 fL Final     RDW   Date Value Ref Range Status   05/08/2020 14.2 11.7 - 15.4 % Final     Platelet   Date Value Ref Range Status   05/08/2020 306 150 - 450 x10E3/uL Final     Neutrophils %   Date Value Ref Range Status   09/10/2019 61.9 % Final     Lymphocytes %   Date Value Ref Range Status   02/06/2020 32 Not Estab. % Final     Monocytes %   Date Value Ref Range Status   02/06/2020 6 Not Estab. % Final     Eosinophils %   Date Value Ref Range Status   02/06/2020 1 Not Estab. % Final     Basophils %   Date Value Ref Range Status   02/06/2020 1 Not Estab. % Final

## 2020-10-15 NOTE — Unmapped (Signed)
[facility] Refill Coordination Note    Specialty Medication(s) to be Shipped:   Inflammatory Disorders: Humira and methotrexate (injectable)    Other medication(s) to be shipped: syringes     Michele Miller, DOB: 05/18/1961  Phone: [phone] (home)       All above HIPAA information was verified with patient.     Was a Nurse, learning disability used for this call? No    Completed refill call assessment today to schedule patient's medication shipment from the [facility] [phone]).       Specialty medication(s) and dose(s) confirmed: Regimen is correct and unchanged.   Changes to medications: [name] reports no changes at this time.  Changes to insurance: No  Questions for the pharmacist: No    Confirmed patient received Welcome Packet with first shipment. The patient will receive a drug information handout for each medication shipped and additional FDA Medication Guides as required.       DISEASE/MEDICATION-SPECIFIC INFORMATION        For patients on injectable medications: Patient currently has 1 humira, 2 methotrexate doses left.  Next injection is scheduled for 2/4.    SPECIALTY MEDICATION ADHERENCE     Medication Adherence    Patient reported X missed doses in the last month: 0  Specialty Medication: Humira  Patient is on additional specialty medications: Yes  Additional Specialty Medications: Methotrexate  Patient Reported Additional Medication X Missed Doses in the Last Month: 0  Patient is on more than two specialty medications: No  Any gaps in refill history greater than 2 weeks in the last 3 months: no  Demonstrates understanding of importance of adherence: yes  Informant: patient                Humira 40mg /0.60ml: Patient has 14 days of medication on hand  Methotrexate 25mg /ml :Patient has 14 days of medication on hand      SHIPPING     Shipping address confirmed in Epic.     Delivery Scheduled: Yes, Expected medication delivery date: 2/11.  However, Rx request for refills was sent to the provider as there are none remaining.     Medication will be delivered via Same Day Courier to the prescription address in Epic WAM.    [name]   [facility] Specialty Technician

## 2020-10-16 DIAGNOSIS — L4050 Arthropathic psoriasis, unspecified: Principal | ICD-10-CM

## 2020-10-16 DIAGNOSIS — M9902 Segmental and somatic dysfunction of thoracic region: Secondary | ICD-10-CM | POA: Diagnosis not present

## 2020-10-16 DIAGNOSIS — M5414 Radiculopathy, thoracic region: Secondary | ICD-10-CM | POA: Diagnosis not present

## 2020-10-16 DIAGNOSIS — M5033 Other cervical disc degeneration, cervicothoracic region: Secondary | ICD-10-CM | POA: Diagnosis not present

## 2020-10-16 DIAGNOSIS — M9901 Segmental and somatic dysfunction of cervical region: Secondary | ICD-10-CM | POA: Diagnosis not present

## 2020-10-16 MED ORDER — GABAPENTIN 300 MG CAPSULE
ORAL_CAPSULE | 0 refills | 0 days | Status: CP
Start: 2020-10-16 — End: ?

## 2020-10-17 DIAGNOSIS — M9902 Segmental and somatic dysfunction of thoracic region: Secondary | ICD-10-CM | POA: Diagnosis not present

## 2020-10-17 DIAGNOSIS — M9901 Segmental and somatic dysfunction of cervical region: Secondary | ICD-10-CM | POA: Diagnosis not present

## 2020-10-17 DIAGNOSIS — M5414 Radiculopathy, thoracic region: Secondary | ICD-10-CM | POA: Diagnosis not present

## 2020-10-17 DIAGNOSIS — M5033 Other cervical disc degeneration, cervicothoracic region: Secondary | ICD-10-CM | POA: Diagnosis not present

## 2020-10-18 MED ORDER — METHOTREXATE SODIUM 25 MG/ML INJECTION SOLUTION
SUBCUTANEOUS | 3 refills | 28 days | Status: CP
Start: 2020-10-18 — End: ?

## 2020-10-18 NOTE — Unmapped (Signed)
More recent labs from 07/2020 reviewed in [facility]:  WBC 6.8, H/H 14/40, Plt 367  Cr 0.74, AST 33, ALT 39  MTX refilled  Follow up as scheduled 01/10/2021  AEN, MD

## 2020-10-21 ENCOUNTER — Encounter: Admit: 2020-10-21 | Discharge: 2020-10-22 | Payer: MEDICARE

## 2020-10-21 DIAGNOSIS — S32000A Wedge compression fracture of unspecified lumbar vertebra, initial encounter for closed fracture: Principal | ICD-10-CM

## 2020-10-21 DIAGNOSIS — M5136 Other intervertebral disc degeneration, lumbar region: Principal | ICD-10-CM

## 2020-10-21 DIAGNOSIS — M545 Low back pain, unspecified: Principal | ICD-10-CM

## 2020-10-21 DIAGNOSIS — M7122 Synovial cyst of popliteal space [Baker], left knee: Principal | ICD-10-CM

## 2020-10-21 DIAGNOSIS — M48061 Spinal stenosis, lumbar region without neurogenic claudication: Secondary | ICD-10-CM | POA: Diagnosis not present

## 2020-10-21 DIAGNOSIS — M47816 Spondylosis without myelopathy or radiculopathy, lumbar region: Secondary | ICD-10-CM | POA: Diagnosis not present

## 2020-10-21 DIAGNOSIS — M4856XA Collapsed vertebra, not elsewhere classified, lumbar region, initial encounter for fracture: Secondary | ICD-10-CM | POA: Diagnosis not present

## 2020-10-22 ENCOUNTER — Ambulatory Visit (INDEPENDENT_AMBULATORY_CARE_PROVIDER_SITE_OTHER): Payer: Medicare Other | Admitting: Podiatry

## 2020-10-22 ENCOUNTER — Other Ambulatory Visit: Payer: Self-pay

## 2020-10-22 ENCOUNTER — Encounter: Payer: Self-pay | Admitting: Podiatry

## 2020-10-22 DIAGNOSIS — M722 Plantar fascial fibromatosis: Secondary | ICD-10-CM | POA: Diagnosis not present

## 2020-10-22 DIAGNOSIS — M5414 Radiculopathy, thoracic region: Secondary | ICD-10-CM | POA: Diagnosis not present

## 2020-10-22 DIAGNOSIS — M9901 Segmental and somatic dysfunction of cervical region: Secondary | ICD-10-CM | POA: Diagnosis not present

## 2020-10-22 DIAGNOSIS — M9902 Segmental and somatic dysfunction of thoracic region: Secondary | ICD-10-CM | POA: Diagnosis not present

## 2020-10-22 DIAGNOSIS — M5033 Other cervical disc degeneration, cervicothoracic region: Secondary | ICD-10-CM | POA: Diagnosis not present

## 2020-10-22 NOTE — Patient Instructions (Signed)
Do the entire set of stretches 2 times daily:    Plantar Fasciitis (Heel Spur Syndrome) with Rehab The plantar fascia is a fibrous, ligament-like, soft-tissue structure that spans the bottom of the foot. Plantar fasciitis is a condition that causes pain in the foot due to inflammation of the tissue. SYMPTOMS   Pain and tenderness on the underneath side of the foot.  Pain that worsens with standing or walking. CAUSES  Plantar fasciitis is caused by irritation and injury to the plantar fascia on the underneath side of the foot. Common mechanisms of injury include:  Direct trauma to bottom of the foot.  Damage to a small nerve that runs under the foot where the main fascia attaches to the heel bone.  Stress placed on the plantar fascia due to bone spurs. RISK INCREASES WITH:   Activities that place stress on the plantar fascia (running, jumping, pivoting, or cutting).  Poor strength and flexibility.  Improperly fitted shoes.  Tight calf muscles.  Flat feet.  Failure to warm-up properly before activity.  Obesity. PREVENTION  Warm up and stretch properly before activity.  Allow for adequate recovery between workouts.  Maintain physical fitness:  Strength, flexibility, and endurance.  Cardiovascular fitness.  Maintain a health body weight.  Avoid stress on the plantar fascia.  Wear properly fitted shoes, including arch supports for individuals who have flat feet.  PROGNOSIS  If treated properly, then the symptoms of plantar fasciitis usually resolve without surgery. However, occasionally surgery is necessary.  RELATED COMPLICATIONS   Recurrent symptoms that may result in a chronic condition.  Problems of the lower back that are caused by compensating for the injury, such as limping.  Pain or weakness of the foot during push-off following surgery.  Chronic inflammation, scarring, and partial or complete fascia tear, occurring more often from repeated  injections.  TREATMENT  Treatment initially involves the use of ice and medication to help reduce pain and inflammation. The use of strengthening and stretching exercises may help reduce pain with activity, especially stretches of the Achilles tendon. These exercises may be performed at home or with a therapist. Your caregiver may recommend that you use heel cups of arch supports to help reduce stress on the plantar fascia. Occasionally, corticosteroid injections are given to reduce inflammation. If symptoms persist for greater than 6 months despite non-surgical (conservative), then surgery may be recommended.   MEDICATION   If pain medication is necessary, then nonsteroidal anti-inflammatory medications, such as aspirin and ibuprofen, or other minor pain relievers, such as acetaminophen, are often recommended.  Do not take pain medication within 7 days before surgery.  Prescription pain relievers may be given if deemed necessary by your caregiver. Use only as directed and only as much as you need.  Corticosteroid injections may be given by your caregiver. These injections should be reserved for the most serious cases, because they may only be given a certain number of times.  HEAT AND COLD  Cold treatment (icing) relieves pain and reduces inflammation. Cold treatment should be applied for 10 to 15 minutes every 2 to 3 hours for inflammation and pain and immediately after any activity that aggravates your symptoms. Use ice packs or massage the area with a piece of ice (ice massage).  Heat treatment may be used prior to performing the stretching and strengthening activities prescribed by your caregiver, physical therapist, or athletic trainer. Use a heat pack or soak the injury in warm water.  SEEK IMMEDIATE MEDICAL CARE IF:  Treatment  seems to offer no benefit, or the condition worsens.  Any medications produce adverse side effects.  EXERCISES- RANGE OF MOTION (ROM) AND STRETCHING  EXERCISES - Plantar Fasciitis (Heel Spur Syndrome) These exercises may help you when beginning to rehabilitate your injury. Your symptoms may resolve with or without further involvement from your physician, physical therapist or athletic trainer. While completing these exercises, remember:   Restoring tissue flexibility helps normal motion to return to the joints. This allows healthier, less painful movement and activity.  An effective stretch should be held for at least 30 seconds.  A stretch should never be painful. You should only feel a gentle lengthening or release in the stretched tissue.  RANGE OF MOTION - Toe Extension, Flexion  Sit with your right / left leg crossed over your opposite knee.  Grasp your toes and gently pull them back toward the top of your foot. You should feel a stretch on the bottom of your toes and/or foot.  Hold this stretch for 10 seconds.  Now, gently pull your toes toward the bottom of your foot. You should feel a stretch on the top of your toes and or foot.  Hold this stretch for 10 seconds. Repeat  times. Complete this stretch 3 times per day.   RANGE OF MOTION - Ankle Dorsiflexion, Active Assisted  Remove shoes and sit on a chair that is preferably not on a carpeted surface.  Place right / left foot under knee. Extend your opposite leg for support.  Keeping your heel down, slide your right / left foot back toward the chair until you feel a stretch at your ankle or calf. If you do not feel a stretch, slide your bottom forward to the edge of the chair, while still keeping your heel down.  Hold this stretch for 10 seconds. Repeat 3 times. Complete this stretch 2 times per day.   STRETCH  Gastroc, Standing  Place hands on wall.  Extend right / left leg, keeping the front knee somewhat bent.  Slightly point your toes inward on your back foot.  Keeping your right / left heel on the floor and your knee straight, shift your weight toward the wall,  not allowing your back to arch.  You should feel a gentle stretch in the right / left calf. Hold this position for 10 seconds. Repeat 3 times. Complete this stretch 2 times per day.  STRETCH  Soleus, Standing  Place hands on wall.  Extend right / left leg, keeping the other knee somewhat bent.  Slightly point your toes inward on your back foot.  Keep your right / left heel on the floor, bend your back knee, and slightly shift your weight over the back leg so that you feel a gentle stretch deep in your back calf.  Hold this position for 10 seconds. Repeat 3 times. Complete this stretch 2 times per day.  STRETCH  Gastrocsoleus, Standing  Note: This exercise can place a lot of stress on your foot and ankle. Please complete this exercise only if specifically instructed by your caregiver.   Place the ball of your right / left foot on a step, keeping your other foot firmly on the same step.  Hold on to the wall or a rail for balance.  Slowly lift your other foot, allowing your body weight to press your heel down over the edge of the step.  You should feel a stretch in your right / left calf.  Hold this position for 10 seconds.  Repeat this exercise with a slight bend in your right / left knee. Repeat 3 times. Complete this stretch 2 times per day.   STRENGTHENING EXERCISES - Plantar Fasciitis (Heel Spur Syndrome)  These exercises may help you when beginning to rehabilitate your injury. They may resolve your symptoms with or without further involvement from your physician, physical therapist or athletic trainer. While completing these exercises, remember:   Muscles can gain both the endurance and the strength needed for everyday activities through controlled exercises.  Complete these exercises as instructed by your physician, physical therapist or athletic trainer. Progress the resistance and repetitions only as guided.  STRENGTH - Towel Curls  Sit in a chair positioned on a  non-carpeted surface.  Place your foot on a towel, keeping your heel on the floor.  Pull the towel toward your heel by only curling your toes. Keep your heel on the floor. Repeat 3 times. Complete this exercise 2 times per day.  STRENGTH - Ankle Inversion  Secure one end of a rubber exercise band/tubing to a fixed object (table, pole). Loop the other end around your foot just before your toes.  Place your fists between your knees. This will focus your strengthening at your ankle.  Slowly, pull your big toe up and in, making sure the band/tubing is positioned to resist the entire motion.  Hold this position for 10 seconds.  Have your muscles resist the band/tubing as it slowly pulls your foot back to the starting position. Repeat 3 times. Complete this exercises 2 times per day.  Document Released: 09/01/2005 Document Revised: 11/24/2011 Document Reviewed: 12/14/2008 [facility] Patient Information 2014 [place], [place].

## 2020-10-22 NOTE — Progress Notes (Signed)
  Subjective:  Patient ID: Emily Johnston, female    DOB: March 31, 1961,  MRN: [id number]  Chief Complaint  Patient presents with  . Plantar Fasciitis    "it has been doing good, but now starting to bother me again"    60 y.o. female returns with the above complaint. History confirmed with patient.  It is mainly on the right side now, the left side is much better.  She is back on methocarbamol from her back surgeon.  She certainly had burning and aching on the outside of the ankle and foot as well  Objective:  Physical Exam: warm, good capillary refill, no trophic changes or ulcerative lesions, normal DP and PT pulses and normal sensory exam.   Right Foot: Minimal pain at the insertion of the heel, more tenderness in the mid plantar fascia now.  No pain on palpation to the fifth metatarsal base, peroneal tendons or with resisted eversion  Radiographs: X-ray of both feet: no fracture, dislocation, swelling or degenerative changes noted, plantar calcaneal spur and posterior calcaneal spur Assessment:   1. Plantar fasciitis of right foot      Plan:  Patient was evaluated and treated and all questions answered.   -Continue stretching and icing of the affected limb.  Exercise this was reprinted for her today -Continue wearing plantar fascial brace, a new brace was dispensed as her appointment is wearing out -She was not sharply tender today and we did not opt for injection.  Discussed with her that if it returns and becomes very sharp and painful we should perform injection. -If she is not much better by next visit I think physical therapy be best thing for her   Return in about 1 month (around 11/19/2020) for recheck plantar fasciitis.

## 2020-10-23 DIAGNOSIS — Z79899 Other long term (current) drug therapy: Secondary | ICD-10-CM | POA: Diagnosis not present

## 2020-10-24 DIAGNOSIS — M5033 Other cervical disc degeneration, cervicothoracic region: Secondary | ICD-10-CM | POA: Diagnosis not present

## 2020-10-24 DIAGNOSIS — M9901 Segmental and somatic dysfunction of cervical region: Secondary | ICD-10-CM | POA: Diagnosis not present

## 2020-10-24 DIAGNOSIS — M5414 Radiculopathy, thoracic region: Secondary | ICD-10-CM | POA: Diagnosis not present

## 2020-10-24 DIAGNOSIS — M9902 Segmental and somatic dysfunction of thoracic region: Secondary | ICD-10-CM | POA: Diagnosis not present

## 2020-10-26 DIAGNOSIS — M9901 Segmental and somatic dysfunction of cervical region: Secondary | ICD-10-CM | POA: Diagnosis not present

## 2020-10-26 DIAGNOSIS — M5414 Radiculopathy, thoracic region: Secondary | ICD-10-CM | POA: Diagnosis not present

## 2020-10-26 DIAGNOSIS — M9902 Segmental and somatic dysfunction of thoracic region: Secondary | ICD-10-CM | POA: Diagnosis not present

## 2020-10-26 DIAGNOSIS — M5033 Other cervical disc degeneration, cervicothoracic region: Secondary | ICD-10-CM | POA: Diagnosis not present

## 2020-10-26 MED FILL — HUMIRA PEN CITRATE FREE 40 MG/0.4 ML: SUBCUTANEOUS | 28 days supply | Qty: 2 | Fill #2

## 2020-10-26 MED FILL — METHOTREXATE SODIUM 25 MG/ML INJECTION SOLUTION: SUBCUTANEOUS | 28 days supply | Qty: 4 | Fill #0

## 2020-10-26 MED FILL — BD TUBERCULIN SYRINGE 1 ML 27 X 1/2": 28 days supply | Qty: 4 | Fill #8

## 2020-10-29 ENCOUNTER — Encounter: Payer: Self-pay | Admitting: Nurse Practitioner

## 2020-10-29 ENCOUNTER — Other Ambulatory Visit: Payer: Self-pay

## 2020-10-29 ENCOUNTER — Ambulatory Visit (INDEPENDENT_AMBULATORY_CARE_PROVIDER_SITE_OTHER): Payer: Medicare Other | Admitting: Nurse Practitioner

## 2020-10-29 VITALS — BP 115/79 | HR 72 | Temp 98.6°F | Ht 63.78 in | Wt 130.2 lb

## 2020-10-29 DIAGNOSIS — L409 Psoriasis, unspecified: Principal | ICD-10-CM

## 2020-10-29 DIAGNOSIS — Z1329 Encounter for screening for other suspected endocrine disorder: Secondary | ICD-10-CM

## 2020-10-29 DIAGNOSIS — Z1211 Encounter for screening for malignant neoplasm of colon: Secondary | ICD-10-CM | POA: Diagnosis not present

## 2020-10-29 DIAGNOSIS — M4712 Other spondylosis with myelopathy, cervical region: Secondary | ICD-10-CM

## 2020-10-29 DIAGNOSIS — F17219 Nicotine dependence, cigarettes, with unspecified nicotine-induced disorders: Secondary | ICD-10-CM

## 2020-10-29 DIAGNOSIS — M5414 Radiculopathy, thoracic region: Secondary | ICD-10-CM | POA: Diagnosis not present

## 2020-10-29 DIAGNOSIS — F4310 Post-traumatic stress disorder, unspecified: Secondary | ICD-10-CM

## 2020-10-29 DIAGNOSIS — M9902 Segmental and somatic dysfunction of thoracic region: Secondary | ICD-10-CM | POA: Diagnosis not present

## 2020-10-29 DIAGNOSIS — J432 Centrilobular emphysema: Secondary | ICD-10-CM

## 2020-10-29 DIAGNOSIS — L4050 Arthropathic psoriasis, unspecified: Secondary | ICD-10-CM | POA: Diagnosis not present

## 2020-10-29 DIAGNOSIS — Z136 Encounter for screening for cardiovascular disorders: Secondary | ICD-10-CM | POA: Diagnosis not present

## 2020-10-29 DIAGNOSIS — M4722 Other spondylosis with radiculopathy, cervical region: Secondary | ICD-10-CM

## 2020-10-29 DIAGNOSIS — Z0000 Encounter for general adult medical examination without abnormal findings: Secondary | ICD-10-CM | POA: Diagnosis not present

## 2020-10-29 DIAGNOSIS — Z114 Encounter for screening for human immunodeficiency virus [HIV]: Secondary | ICD-10-CM

## 2020-10-29 DIAGNOSIS — Z803 Family history of malignant neoplasm of breast: Secondary | ICD-10-CM

## 2020-10-29 DIAGNOSIS — Z13220 Encounter for screening for lipoid disorders: Secondary | ICD-10-CM | POA: Diagnosis not present

## 2020-10-29 DIAGNOSIS — I700 Atherosclerosis of aorta: Secondary | ICD-10-CM | POA: Diagnosis not present

## 2020-10-29 DIAGNOSIS — M797 Fibromyalgia: Secondary | ICD-10-CM

## 2020-10-29 DIAGNOSIS — M9901 Segmental and somatic dysfunction of cervical region: Secondary | ICD-10-CM | POA: Diagnosis not present

## 2020-10-29 DIAGNOSIS — F332 Major depressive disorder, recurrent severe without psychotic features: Secondary | ICD-10-CM | POA: Diagnosis not present

## 2020-10-29 DIAGNOSIS — Z1159 Encounter for screening for other viral diseases: Secondary | ICD-10-CM

## 2020-10-29 DIAGNOSIS — M5033 Other cervical disc degeneration, cervicothoracic region: Secondary | ICD-10-CM | POA: Diagnosis not present

## 2020-10-29 MED ORDER — ACITRETIN 25 MG CAPSULE
ORAL_CAPSULE | ORAL | 1 refills | 0.00000 days | Status: CP
Start: 2020-10-29 — End: ?

## 2020-10-29 NOTE — Assessment & Plan Note (Signed)
Mother with current breast cancer.  Highly recommended patient update her mammogram and order placed.

## 2020-10-29 NOTE — Assessment & Plan Note (Signed)
Continue collaboration with rheumatology & dermatology at [facility] and current medication regimen.  Reviewed recent notes.  Will obtain CBC and CMP today.

## 2020-10-29 NOTE — Assessment & Plan Note (Signed)
Chronic, ongoing.  Continue Duloxetine for mood and pain, adjust medications as needed.  Return in 6 months

## 2020-10-29 NOTE — Assessment & Plan Note (Signed)
Discussed at length with patient, have recommended complete cessation of smoking and educated patient on risk with smoking.  Consider addition of statin and ASA in future.

## 2020-10-29 NOTE — Patient Instructions (Signed)

## 2020-10-29 NOTE — Assessment & Plan Note (Addendum)
Chronic, ongoing.  Continue current medication regimen and collaboration with psychiatry and therapist. Denies SI/HI.  She is aware all psychiatric medications are to be filled by her psychiatrist and she needs to ensure continued care with them.  Advised her to reach out to them and discuss concerns with her medication changes, she has spoken to therapist about concerns.  Return in 6 months.

## 2020-10-29 NOTE — Assessment & Plan Note (Signed)
Chronic, stable.  Continue current medication regimen and adjust as needed. Spirometry due, will obtain next visit.  Recommend complete cessation smoking.  Continue annual CT screening.  Return in 6 months for follow-up and spirometry.

## 2020-10-29 NOTE — Progress Notes (Addendum)
BP 115/79   Pulse 72   Temp 98.6 F (37 C) (Oral)   Ht 5' 3.78" (1.62 m)   Wt 130 lb 3.2 oz (59.1 kg)   SpO2 97%   BMI 22.50 kg/m    Subjective:    Patient ID: Emily Johnston, female    DOB: 1961/05/05, 60 y.o.   MRN: [id number]  HPI: Emily Johnston is a 60 y.o. female presenting on 10/29/2020 for initial annual medicare wellness visit. Current medical complaints include:none  She currently lives with: self Menopausal Symptoms: no   The 10-year ASCVD risk score Mikey Bussing DC Brooke Bonito., et al., 2013) is: 5.1%   Values used to calculate the score:     Age: 29 years     Sex: Female     Is Non-Hispanic African American: No     Diabetic: No     Tobacco smoker: Yes     Systolic Blood Pressure: 540 mmHg     Is BP treated: No     HDL Cholesterol: 48 mg/dL     Total Cholesterol: 189 mg/dL   COPD Has been smoking 1/2  PPD, has been smoking since age 30. Continues on Stiolto daily and Albuterol as needed.  Had lung CT CA screening 06/25/20, which was benign, but did note aortic atherosclerosis and emphysema.   COPD status:stable Satisfied with current treatment?:yes Oxygen use:no Dyspnea frequency:occasional Cough frequency:occasional Rescue inhaler frequency:a few times a month Limitation of activity:no Productive cough: currently Last Spirometry:03/22/2019 Pneumovax:Up to Date Influenza:Up to Date  PSORIATIC ARTHRITIS: Followed by rheumatology at [facility] and last seen 10/28/21and dermatology last 08/27/2020.  She continues on Methotrexate weekly, Humira every two weeks, and folic acid.  This regimen has offered some benefit.  Has been using Clobetasol which was prescribed at [facility] by rheumatology she reports.  CERVICAL SPONDYLOSIS: Had recent cervical fusion on 09/22/2019 performed at [facility] by Dr. Wynn Banker.  She continues to be followed by [facility] ortho and last visit was 09/24/20 -- had recent MRI that she reports showed more deterioation.  Seeing chiropractor  today.  FIBROMYALGIA Continues on Duloxetine.   Pain status: stable Satisfied with current treatment?: yes Previous pain specialty evaluation: no Non-narcotic analgesic meds: yes Narcotic contract:no Treatments attempted: rest, APAP, ibuprofen and physical therapy    DEPRESSION/PTSD Currently followed by psychiatry team, [facility] in [place]. Reports Helyn App her previous physician left and went to [facility], now seeing new physician there who she has poor rapport there. Continues onCymbalta, Vyvanse, and Klonopin -- reports that provider took her off Adderall and this has caused issues, states Vyvanse is not working as well.  Is being followed by therapy too, which she reports benefit from and good relationship with.  Reports she is attempting to get back in with previous psychiatrist, Helyn App who is at [facility]. Mood status:stable Satisfied with current treatment?:yes Symptom severity:moderate Duration of current treatment :chronic Side effects:no Medication compliance:good compliance Psychotherapy/counseling:yescurrent Depressed mood:yes Anxious mood:yes Anhedonia:no Significant weight loss or gain:no Insomnia:yeshard to stay asleep Fatigue:no Feelings of worthlessness or guilt:no Impaired concentration/indecisiveness:yes Suicidal ideations:no Hopelessness:no Crying spells:no Depression screen [facility] 2/9 10/29/2020 09/19/2020 08/13/2020 07/10/2020 05/07/2020  Decreased Interest 3 3 3 1 1   Down, Depressed, Hopeless 3 3 3 1 1   PHQ - 2 Score 6 6 6 2 2   Altered sleeping - 3 2 2  0  Tired, decreased energy 2 2 3  0 1  Change in appetite 3 2 3 1 1   Feeling bad or failure about yourself  3 2 2  1 0  Trouble concentrating 3 - 2 0 1  Moving slowly or fidgety/restless 3 0 3 0 0  Suicidal thoughts 0 1 0 0 0  PHQ-9 Score - 16 21 6 5   Difficult doing work/chores - - Extremely dIfficult Not difficult at all Not difficult at all  Some recent data might be hidden    The patient does not have a history of falls. I did not complete a risk assessment for falls. A plan of care for falls was not documented.   Past Medical History:  Past Medical History:  Diagnosis Date  . Anxiety   . Arthritis   . COPD (chronic obstructive pulmonary disease) ([place])   . Depression   . Fibromyalgia   . IBS (irritable bowel syndrome)   . PTSD (post-traumatic stress disorder)     Surgical History:  Past Surgical History:  Procedure Laterality Date  . ABDOMINAL HYSTERECTOMY    . CARPAL TUNNEL RELEASE    . KNEE SURGERY      Medications:  Current Outpatient Medications on File Prior to Visit  Medication Sig  . acitretin (SORIATANE) 25 MG capsule Take 1 capsule by mouth daily.  . Adalimumab (HUMIRA PEN) 40 MG/0.4ML PNKT Inject into the skin.  Marland Kitchen albuterol (VENTOLIN HFA) 108 (90 Base) MCG/ACT inhaler Inhale 2 puffs into the lungs every 6 (six) hours as needed for wheezing or shortness of breath.  . clonazePAM (KLONOPIN) 1 MG tablet Take 1 mg by mouth as needed for anxiety. 3 times daily as needed  . fluticasone (FLONASE) 50 MCG/ACT nasal spray Place 2 sprays into both nostrils daily.  . folic acid (FOLVITE) 1 MG tablet Take 1 mg by mouth daily.  Marland Kitchen gabapentin (NEURONTIN) 300 MG capsule Take 300 mg by mouth 3 (three) times daily.  Marland Kitchen loratadine (CLARITIN) 10 MG tablet Take 1 tablet (10 mg total) by mouth daily.  . meclizine (ANTIVERT) 12.5 MG tablet Take 1 tablet (12.5 mg total) by mouth 3 (three) times daily as needed for dizziness.  . methocarbamol (ROBAXIN) 500 MG tablet Take 500 mg by mouth 3 (three) times daily.  . methotrexate 250 MG/10ML injection Inject 25 mg into the vein once a week.  . Multiple Vitamin (MULTIVITAMIN) tablet Take 1 tablet by mouth daily.  Marland Kitchen OLANZapine (ZYPREXA) 5 MG tablet Take 2.5 mg by mouth 2 (two) times daily.  Marland Kitchen senna (SENOKOT) 8.6 MG tablet Take 1 tablet by mouth as needed.  . Tiotropium Bromide-Olodaterol (STIOLTO RESPIMAT) 2.5-2.5  MCG/ACT AERS Inhale 2 puffs into the lungs daily.  . traZODone (DESYREL) 50 MG tablet Take 2 tablets by mouth daily.  Marland Kitchen VRAYLAR capsule Take 1.5 mg by mouth daily.  Marland Kitchen VYVANSE 30 MG capsule Take 30 mg by mouth at bedtime.  . baclofen (LIORESAL) 10 MG tablet Take 1 tablet (10 mg total) by mouth 2 (two) times daily as needed for muscle spasms. (Patient not taking: Reported on 10/29/2020)  . DULoxetine (CYMBALTA) 60 MG capsule Take 2 capsules by mouth daily.  . meloxicam (MOBIC) 15 MG tablet Take 1 tablet (15 mg total) by mouth daily. (Patient not taking: Reported on 10/29/2020)   No current facility-administered medications on file prior to visit.    Allergies:  No Known Allergies  Social History:  Social History   Socioeconomic History  . Marital status: Legally Separated    Spouse name: Not on file  . Number of children: Not on file  . Years of education: Not on file  . Highest education  level: Not on file  Occupational History  . Not on file  Tobacco Use  . Smoking status: Current Every Day Smoker    Packs/day: 1.00    Years: 41.00    Pack years: 41.00    Types: Cigarettes  . Smokeless tobacco: Never Used  Vaping Use  . Vaping Use: Never used  Substance and Sexual Activity  . Alcohol use: No  . Drug use: No  . Sexual activity: Not on file  Other Topics Concern  . Not on file  Social History Narrative  . Not on file   Social Determinants of Health   Financial Resource Strain: Not on file  Food Insecurity: Not on file  Transportation Needs: Not on file  Physical Activity: Not on file  Stress: Not on file  Social Connections: Not on file  Intimate Partner Violence: Not on file   Social History   Tobacco Use  Smoking Status Current Every Day Smoker  . Packs/day: 1.00  . Years: 41.00  . Pack years: 41.00  . Types: Cigarettes  Smokeless Tobacco Never Used   Social History   Substance and Sexual Activity  Alcohol Use No    Family History:  History  reviewed. No pertinent family history.  Past medical history, surgical history, medications, allergies, family history and social history reviewed with patient today and changes made to appropriate areas of the chart.   Review of Systems - negative All other ROS negative except what is listed above and in the HPI.      Objective:    BP 115/79   Pulse 72   Temp 98.6 F (37 C) (Oral)   Ht 5' 3.78" (1.62 m)   Wt 130 lb 3.2 oz (59.1 kg)   SpO2 97%   BMI 22.50 kg/m   Wt Readings from Last 3 Encounters:  10/29/20 130 lb 3.2 oz (59.1 kg)  10/10/20 130 lb (59 kg)  10/03/20 130 lb (59 kg)    Physical Exam Vitals and nursing note reviewed.  Constitutional:      General: She is awake. She is not in acute distress.    Appearance: She is well-developed. She is not ill-appearing.  HENT:     Head: Normocephalic and atraumatic.     Right Ear: Hearing, tympanic membrane, ear canal and external ear normal. No drainage.     Left Ear: Hearing, tympanic membrane, ear canal and external ear normal. No drainage.     Nose: Nose normal.     Right Sinus: No maxillary sinus tenderness or frontal sinus tenderness.     Left Sinus: No maxillary sinus tenderness or frontal sinus tenderness.     Mouth/Throat:     Mouth: Mucous membranes are moist.     Pharynx: Oropharynx is clear. Uvula midline. No pharyngeal swelling, oropharyngeal exudate or posterior oropharyngeal erythema.  Eyes:     General: Lids are normal.        Right eye: No discharge.        Left eye: No discharge.     Extraocular Movements: Extraocular movements intact.     Conjunctiva/sclera: Conjunctivae normal.     Pupils: Pupils are equal, round, and reactive to light.     Visual Fields: Right eye visual fields normal and left eye visual fields normal.  Neck:     Thyroid: No thyromegaly.     Vascular: No carotid bruit.     Trachea: Trachea normal.  Cardiovascular:     Rate and Rhythm: Normal rate and regular  rhythm.     Heart  sounds: Normal heart sounds. No murmur heard. No gallop.   Pulmonary:     Effort: Pulmonary effort is normal. No accessory muscle usage or respiratory distress.     Breath sounds: Normal breath sounds.  Chest:  Breasts:     Right: Normal. No axillary adenopathy or supraclavicular adenopathy.     Left: Normal. No axillary adenopathy or supraclavicular adenopathy.    Abdominal:     General: Bowel sounds are normal.     Palpations: Abdomen is soft. There is no hepatomegaly or splenomegaly.     Tenderness: There is no abdominal tenderness.  Musculoskeletal:        General: Normal range of motion.     Cervical back: Normal range of motion and neck supple.     Right lower leg: No edema.     Left lower leg: No edema.  Lymphadenopathy:     Head:     Right side of head: No submental, submandibular, tonsillar, preauricular or posterior auricular adenopathy.     Left side of head: No submental, submandibular, tonsillar, preauricular or posterior auricular adenopathy.     Cervical: No cervical adenopathy.     Upper Body:     Right upper body: No supraclavicular, axillary or pectoral adenopathy.     Left upper body: No supraclavicular, axillary or pectoral adenopathy.  Skin:    General: Skin is warm and dry.     Capillary Refill: Capillary refill takes less than 2 seconds.     Findings: No rash.  Neurological:     Mental Status: She is alert and oriented to person, place, and time.     Cranial Nerves: Cranial nerves are intact.     Gait: Gait is intact.     Deep Tendon Reflexes: Reflexes are normal and symmetric.     Reflex Scores:      Brachioradialis reflexes are 2+ on the right side and 2+ on the left side.      Patellar reflexes are 2+ on the right side and 2+ on the left side. Psychiatric:        Attention and Perception: Attention normal.        Mood and Affect: Mood normal.        Speech: Speech normal.        Behavior: Behavior normal. Behavior is cooperative.        Thought  Content: Thought content normal.        Judgment: Judgment normal.    Results for orders placed or performed in visit on 08/13/20  Novel Coronavirus, NAA ([facility])   Specimen: Saline  Result Value Ref Range   SARS-CoV-2, NAA Not Detected Not Detected  SARS-COV-2, NAA 2 DAY TAT  Result Value Ref Range   SARS-CoV-2, NAA 2 DAY TAT Performed       Assessment & Plan:   Problem List Items Addressed This Visit      Cardiovascular and Mediastinum   Aortic atherosclerosis ([place])    Discussed at length with patient, have recommended complete cessation of smoking and educated patient on risk with smoking.  Consider addition of statin and ASA in future.        Respiratory   Centrilobular emphysema (HCC) - Primary    Chronic, stable.  Continue current medication regimen and adjust as needed. Spirometry due, will obtain next visit.  Recommend complete cessation smoking.  Continue annual CT screening.  Return in 6 months for follow-up and spirometry.  Relevant Orders   Comprehensive metabolic panel   CBC with Differential/Platelet     Nervous and Auditory   Nicotine dependence, cigarettes, w unsp disorders    I have recommended complete cessation of tobacco use. I have discussed various options available for assistance with tobacco cessation including over the counter methods (Nicotine gum, patch and lozenges). We also discussed prescription options (Chantix, Nicotine Inhaler / Nasal Spray). The patient is not interested in pursuing any prescription tobacco cessation options at this time.       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 Continue collaboration with ortho at [facility], recent notes reviewed.        Musculoskeletal and Integument   Psoriatic arthritis ([place])    Continue collaboration with rheumatology & dermatology at [facility] and current medication regimen.  Reviewed recent notes.  Will obtain CBC and CMP today.        Other    Severe recurrent major depression without psychotic features (HCC)    Chronic, ongoing.  Continue current medication regimen and collaboration with psychiatry and therapist. Denies SI/HI.  She is aware all psychiatric medications are to be filled by her psychiatrist and she needs to ensure continued care with them.  Advised her to reach out to them and discuss concerns with her medication changes, she has spoken to therapist about concerns.  Return in 6 months.      PTSD (post-traumatic stress disorder)    Chronic, ongoing.  Continue current medication regimen and collaboration with psychiatry + therapy sessions, which patient has reported benefit from.  Denies SI/HI.      Fibromyalgia    Chronic, ongoing.  Continue Duloxetine for mood and pain, adjust medications as needed.  Return in 6 months      Family history of breast cancer in mother    Mother with current breast cancer.  Highly recommended patient update her mammogram and order placed.      Relevant Orders   MM DIGITAL SCREENING BILATERAL    Other Visit Diagnoses    Encounter for screening for HIV       HIV screening on labs today   Relevant Orders   HIV Antibody (routine testing w rflx)   Need for hepatitis C screening test       Hep C screening today   Relevant Orders   Hepatitis C antibody   Screening cholesterol level       Lipid panel today   Relevant Orders   Lipid Panel w/o Chol/HDL Ratio   Thyroid disorder screening       TSH on labs today   Relevant Orders   TSH   Colon cancer screening       Cologuard ordered   Relevant Orders   Cologuard   Annual physical exam       Annual labs today to include CBC, CMP, TSH, lipid       Follow up plan: Return in about 6 months (around 04/28/2021) for COPD, MOOD, ARTHRITIS.   LABORATORY TESTING:  - Pap smear: up to date  IMMUNIZATIONS:   - Tdap: Tetanus vaccination status reviewed: last tetanus booster within 10 years. - Influenza: Up to date - Pneumovax: up  to date - Prevnar: Not applicable - HPV: Not applicable - Zostavax vaccine: ordered  SCREENING: -Mammogram: is aware to schedule -- ordered - Colonoscopy: Ordered today Cologuard - Bone Density: Not applicable  -Hearing Test: Not applicable  -Spirometry: performed 03/22/2019   PATIENT  COUNSELING:    Did not wish to discuss advanced care planning today, will address at future visits if willing.  Advised to take 1 mg of folate supplement per day if capable of pregnancy.   Sexuality: Discussed sexually transmitted diseases, partner selection, use of condoms, avoidance of unintended pregnancy  and contraceptive alternatives.   Advised to avoid cigarette smoking.  I discussed with the patient that most people either abstain from alcohol or drink within safe limits (<=14/week and <=4 drinks/occasion for males, <=7/weeks and <= 3 drinks/occasion for females) and that the risk for alcohol disorders and other health effects rises proportionally with the number of drinks per week and how often a drinker exceeds daily limits.  Discussed cessation/primary prevention of drug use and availability of treatment for abuse.   Diet: Encouraged to adjust caloric intake to maintain  or achieve ideal body weight, to reduce intake of dietary saturated fat and total fat, to limit sodium intake by avoiding high sodium foods and not adding table salt, and to maintain adequate dietary potassium and calcium preferably from fresh fruits, vegetables, and low-fat dairy products.    Stressed the importance of regular exercise  Injury prevention: Discussed safety belts, safety helmets, smoke detector, smoking near bedding or upholstery.   Dental health: Discussed importance of regular tooth brushing, flossing, and dental visits.    NEXT PHYSICAL IN ONE YEAR. Return in about 6 months (around 04/28/2021) for COPD, MOOD, ARTHRITIS.

## 2020-10-29 NOTE — Assessment & Plan Note (Signed)
Last surgery on 09/22/2019. Continue collaboration with ortho at [facility], recent notes reviewed.

## 2020-10-29 NOTE — Assessment & Plan Note (Signed)
Chronic, ongoing.  Continue current medication regimen and collaboration with psychiatry + therapy sessions, which patient has reported benefit from.  Denies SI/HI.

## 2020-10-29 NOTE — Assessment & Plan Note (Signed)
I have recommended complete cessation of tobacco use. I have discussed various options available for assistance with tobacco cessation including over the counter methods (Nicotine gum, patch and lozenges). We also discussed prescription options (Chantix, Nicotine Inhaler / Nasal Spray). The patient is not interested in pursuing any prescription tobacco cessation options at this time.  

## 2020-10-30 LAB — CBC WITH DIFFERENTIAL/PLATELET
Basophils Absolute: 0 10*3/uL (ref 0.0–0.2)
Basos: 0 %
EOS (ABSOLUTE): 0.1 10*3/uL (ref 0.0–0.4)
Eos: 1 %
Hematocrit: 43.5 % (ref 34.0–46.6)
Hemoglobin: 14.1 g/dL (ref 11.1–15.9)
Immature Grans (Abs): 0 10*3/uL (ref 0.0–0.1)
Immature Granulocytes: 0 %
Lymphocytes Absolute: 2.5 10*3/uL (ref 0.7–3.1)
Lymphs: 37 %
MCH: 32.5 pg (ref 26.6–33.0)
MCHC: 32.4 g/dL (ref 31.5–35.7)
MCV: 100 fL — ABNORMAL HIGH (ref 79–97)
Monocytes Absolute: 0.5 10*3/uL (ref 0.1–0.9)
Monocytes: 8 %
Neutrophils Absolute: 3.7 10*3/uL (ref 1.4–7.0)
Neutrophils: 54 %
Platelets: 268 10*3/uL (ref 150–450)
RBC: 4.34 x10E6/uL (ref 3.77–5.28)
RDW: 12.6 % (ref 11.7–15.4)
WBC: 6.8 10*3/uL (ref 3.4–10.8)

## 2020-10-30 LAB — COMPREHENSIVE METABOLIC PANEL
ALT: 31 IU/L (ref 0–32)
AST: 36 IU/L (ref 0–40)
Albumin/Globulin Ratio: 2.3 — ABNORMAL HIGH (ref 1.2–2.2)
Albumin: 4.2 g/dL (ref 3.8–4.9)
Alkaline Phosphatase: 76 IU/L (ref 44–121)
BUN/Creatinine Ratio: 16 (ref 9–23)
BUN: 13 mg/dL (ref 6–24)
Bilirubin Total: <0.2 mg/dL (ref 0.0–1.2)
CO2: 23 mmol/L (ref 20–29)
Calcium: 9.3 mg/dL (ref 8.7–10.2)
Chloride: 106 mmol/L (ref 96–106)
Creatinine, Ser: 0.79 mg/dL (ref 0.57–1.00)
GFR calc Af Amer: 95 mL/min/{1.73_m2} (ref >59)
GFR calc non Af Amer: 83 mL/min/{1.73_m2} (ref >59)
Globulin, Total: 1.8 g/dL (ref 1.5–4.5)
Glucose: 86 mg/dL (ref 65–99)
Potassium: 4 mmol/L (ref 3.5–5.2)
Sodium: 145 mmol/L — ABNORMAL HIGH (ref 134–144)
Total Protein: 6 g/dL (ref 6.0–8.5)

## 2020-10-30 LAB — LIPID PANEL W/O CHOL/HDL RATIO
Cholesterol, Total: 231 mg/dL — ABNORMAL HIGH (ref 100–199)
HDL: 59 mg/dL (ref >39)
LDL Chol Calc (NIH): 144 mg/dL — ABNORMAL HIGH (ref 0–99)
Triglycerides: 157 mg/dL — ABNORMAL HIGH (ref 0–149)
VLDL Cholesterol Cal: 28 mg/dL (ref 5–40)

## 2020-10-30 LAB — HEPATITIS C ANTIBODY: Hep C Virus Ab: 0.1 s/co ratio (ref 0.0–0.9)

## 2020-10-30 LAB — TSH: TSH: 0.674 u[IU]/mL (ref 0.450–4.500)

## 2020-10-30 LAB — HIV ANTIBODY (ROUTINE TESTING W REFLEX): HIV Screen 4th Generation wRfx: NONREACTIVE

## 2020-10-30 NOTE — Progress Notes (Signed)
Contacted via MyChart   Good evening Arryana, your labs have returned.  Overall they look good with a couple exceptions: - Sodium level was a little elevated, very mild, I would recommend cutting back on salt intake and increasing water intake.   - CBC is normal, as is thyroid testing - Hep C and HIV are negative - Your cholesterol is still high, but continued recommendations to make lifestyle changes. Your LDL is above normal. The LDL is the bad cholesterol. Over time and in combination with inflammation and other factors, this contributes to plaque which in turn may lead to stroke and/or heart attack down the road. Sometimes high LDL is primarily genetic, and people might be eating all the right foods but still have high numbers. Other times, there is room for improvement in one's diet and eating healthier can bring this number down and potentially reduce one's risk of heart attack and/or stroke.   To reduce your LDL, Remember - more fruits and vegetables, more fish, and limit red meat and dairy products. More soy, nuts, beans, barley, lentils, oats and plant sterol ester enriched margarine instead of butter. I also encourage eliminating sugar and processed food. Remember, shop on the outside of the grocery store and visit your [employer]. If you would like to talk with me about dietary changes plus or minus medications for your cholesterol, please let me know. We should recheck your cholesterol in 6 months.  Any questions? Keep being awesome!!  Thank you for allowing me to participate in your care. Kindest regards, Sharina Petre

## 2020-10-31 DIAGNOSIS — M5414 Radiculopathy, thoracic region: Secondary | ICD-10-CM | POA: Diagnosis not present

## 2020-10-31 DIAGNOSIS — M5033 Other cervical disc degeneration, cervicothoracic region: Secondary | ICD-10-CM | POA: Diagnosis not present

## 2020-10-31 DIAGNOSIS — M9901 Segmental and somatic dysfunction of cervical region: Secondary | ICD-10-CM | POA: Diagnosis not present

## 2020-10-31 DIAGNOSIS — M9902 Segmental and somatic dysfunction of thoracic region: Secondary | ICD-10-CM | POA: Diagnosis not present

## 2020-11-02 ENCOUNTER — Telehealth: Payer: Self-pay

## 2020-11-02 ENCOUNTER — Ambulatory Visit: Payer: Medicare Other

## 2020-11-02 NOTE — Telephone Encounter (Signed)
This nurse called patient in order to perform scheduled telephonic AWV. Patient states that she is currently in the middle of moving. Appointment rescheduled for 11/12/2020.

## 2020-11-09 ENCOUNTER — Telehealth: Admit: 2020-11-09 | Payer: MEDICARE | Attending: Orthopaedic Surgery | Primary: Orthopaedic Surgery

## 2020-11-09 NOTE — Unmapped (Signed)
Called pt 3 times for virtual visit.  Phone has no message inbox set up.  Texted patient from my cell phone without response.

## 2020-11-12 ENCOUNTER — Ambulatory Visit: Payer: Medicare Other

## 2020-11-12 ENCOUNTER — Telehealth: Payer: Self-pay

## 2020-11-12 NOTE — Telephone Encounter (Signed)
This nurse called patient in order to perform scheduled telephonic AWV. Patient stated that she has just moved and is in the middle of all of that. She would like someone to reach out to her maybe next week to reschedule.

## 2020-11-15 NOTE — Unmapped (Signed)
[facility] Refill Coordination Note    Specialty Medication(s) to be Shipped:   Inflammatory Disorders: Humira and methotrexate (injectable)    Other medication(s) to be shipped: No additional medications requested for fill at this time     Michele Miller, DOB: [birth year]  Phone: [phone] (home)       All above HIPAA information was verified with patient.     Was a Nurse, learning disability used for this call? No    Completed refill call assessment today to schedule patient's medication shipment from the [facility] [phone]).       Specialty medication(s) and dose(s) confirmed: Regimen is correct and unchanged.   Changes to medications: Michele Miller reports no changes at this time.  Changes to insurance: No  Questions for the pharmacist: No    Confirmed patient received Welcome Packet with first shipment. The patient will receive a drug information handout for each medication shipped and additional FDA Medication Guides as required.       DISEASE/MEDICATION-SPECIFIC INFORMATION        For patients on injectable medications: Patient currently has 1 doses left.  Next injection is scheduled for 11/18/20.    SPECIALTY MEDICATION ADHERENCE     Medication Adherence    Patient reported X missed doses in the last month: 0  Specialty Medication: methotrexate injection  Patient is on additional specialty medications: No                methotrexate 25 mg/ml: 3 days of medicine on hand    Humira 40/0.4 mg/ml: 3 days of medicine on hand       SHIPPING     Shipping address confirmed in Epic.     Delivery Scheduled: Yes, Expected medication delivery date: 11/21/20.     Medication will be delivered via Next Day Courier to the prescription address in Epic WAM.    Michele Miller   [facility] Specialty Technician

## 2020-11-19 ENCOUNTER — Encounter: Payer: Medicare Other | Admitting: Podiatry

## 2020-11-20 DIAGNOSIS — Z79899 Other long term (current) drug therapy: Secondary | ICD-10-CM | POA: Diagnosis not present

## 2020-11-20 MED FILL — HUMIRA PEN CITRATE FREE 40 MG/0.4 ML: SUBCUTANEOUS | 28 days supply | Qty: 2 | Fill #3

## 2020-11-20 MED FILL — BD TUBERCULIN SYRINGE 1 ML 25 GAUGE X 5/8": 28 days supply | Qty: 4 | Fill #0

## 2020-11-20 MED FILL — METHOTREXATE SODIUM 25 MG/ML INJECTION SOLUTION: SUBCUTANEOUS | 28 days supply | Qty: 4 | Fill #1

## 2020-12-03 ENCOUNTER — Encounter: Payer: Medicare Other | Admitting: Podiatry

## 2020-12-12 NOTE — Unmapped (Signed)
[facility] Refill Coordination Note    Specialty Medication(s) to be Shipped:   Inflammatory Disorders: Humira and methotrexate (injectable)    Other medication(s) to be shipped: Fluticasone ns,syringes     Michele Miller, DOB: [birth year]  Phone: [phone] (home)       All above HIPAA information was verified with patient.     Was a Nurse, learning disability used for this call? No    Completed refill call assessment today to schedule patient's medication shipment from the [facility] [phone]).       Specialty medication(s) and dose(s) confirmed: Regimen is correct and unchanged.   Changes to medications: [name] reports no changes at this time.  Changes to insurance: No  Questions for the pharmacist: No    Confirmed patient received a Conservation officer, historic buildings and a Surveyor, mining with first shipment. The patient will receive a drug information handout for each medication shipped and additional FDA Medication Guides as required.       DISEASE/MEDICATION-SPECIFIC INFORMATION        For patients on injectable medications: Patient currently has 1-for Humira 0 doses on methotrexate doses left.  Next injection is scheduled for 04/03-Humira 04/06 Methotrxate.    SPECIALTY MEDICATION ADHERENCE     Medication Adherence    Patient reported X missed doses in the last month: 0  Specialty Medication: humira 40mg /0.70ml  Patient is on additional specialty medications: Yes  Additional Specialty Medications: Methotrexate 25mg /ml  Patient Reported Additional Medication X Missed Doses in the Last Month: 0  Patient is on more than two specialty medications: No  Any gaps in refill history greater than 2 weeks in the last 3 months: no  Demonstrates understanding of importance of adherence: yes  Informant: patient  Reliability of informant: reliable  Provider-estimated medication adherence level: good  Patient is at risk for Non-Adherence: No                methotrexate 25 mg/ml: 0 days of medicine on hand   humira 40/0.4 mg/ml: 14 days of medicine on hand         SHIPPING     Shipping address confirmed in Epic.     Delivery Scheduled: Yes, Expected medication delivery date: 04/04.     Medication will be delivered via Same Day Courier to the prescription address in Epic WAM.    Antonietta Barcelona   [facility] Specialty Technician

## 2020-12-17 DIAGNOSIS — Z79899 Other long term (current) drug therapy: Secondary | ICD-10-CM | POA: Diagnosis not present

## 2020-12-17 MED FILL — BD TUBERCULIN SYRINGE 1 ML 25 GAUGE X 5/8": 28 days supply | Qty: 4 | Fill #1

## 2020-12-17 MED FILL — FLUTICASONE PROPIONATE 50 MCG/ACTUATION NASAL SPRAY,SUSPENSION: NASAL | 30 days supply | Qty: 16 | Fill #1

## 2020-12-17 MED FILL — HUMIRA PEN CITRATE FREE 40 MG/0.4 ML: SUBCUTANEOUS | 28 days supply | Qty: 2 | Fill #4

## 2020-12-17 MED FILL — METHOTREXATE SODIUM 25 MG/ML INJECTION SOLUTION: SUBCUTANEOUS | 28 days supply | Qty: 4 | Fill #2

## 2021-01-01 DIAGNOSIS — M5414 Radiculopathy, thoracic region: Secondary | ICD-10-CM | POA: Diagnosis not present

## 2021-01-01 DIAGNOSIS — M545 Low back pain, unspecified: Secondary | ICD-10-CM | POA: Diagnosis not present

## 2021-01-01 DIAGNOSIS — M9903 Segmental and somatic dysfunction of lumbar region: Secondary | ICD-10-CM | POA: Diagnosis not present

## 2021-01-01 DIAGNOSIS — M9902 Segmental and somatic dysfunction of thoracic region: Secondary | ICD-10-CM | POA: Diagnosis not present

## 2021-01-04 DIAGNOSIS — M9903 Segmental and somatic dysfunction of lumbar region: Secondary | ICD-10-CM | POA: Diagnosis not present

## 2021-01-04 DIAGNOSIS — M5414 Radiculopathy, thoracic region: Secondary | ICD-10-CM | POA: Diagnosis not present

## 2021-01-04 DIAGNOSIS — M9902 Segmental and somatic dysfunction of thoracic region: Secondary | ICD-10-CM | POA: Diagnosis not present

## 2021-01-04 DIAGNOSIS — M545 Low back pain, unspecified: Secondary | ICD-10-CM | POA: Diagnosis not present

## 2021-01-07 DIAGNOSIS — M5414 Radiculopathy, thoracic region: Secondary | ICD-10-CM | POA: Diagnosis not present

## 2021-01-07 DIAGNOSIS — M9902 Segmental and somatic dysfunction of thoracic region: Secondary | ICD-10-CM | POA: Diagnosis not present

## 2021-01-07 DIAGNOSIS — M545 Low back pain, unspecified: Secondary | ICD-10-CM | POA: Diagnosis not present

## 2021-01-07 DIAGNOSIS — M9903 Segmental and somatic dysfunction of lumbar region: Secondary | ICD-10-CM | POA: Diagnosis not present

## 2021-01-08 DIAGNOSIS — H532 Diplopia: Secondary | ICD-10-CM | POA: Diagnosis not present

## 2021-01-08 NOTE — Unmapped (Signed)
pt need lab order sent to [facility] in [place]

## 2021-01-08 NOTE — Unmapped (Signed)
Lab orders faxed to [facility]

## 2021-01-09 DIAGNOSIS — M9902 Segmental and somatic dysfunction of thoracic region: Secondary | ICD-10-CM | POA: Diagnosis not present

## 2021-01-09 DIAGNOSIS — M545 Low back pain, unspecified: Secondary | ICD-10-CM | POA: Diagnosis not present

## 2021-01-09 DIAGNOSIS — M5414 Radiculopathy, thoracic region: Secondary | ICD-10-CM | POA: Diagnosis not present

## 2021-01-09 DIAGNOSIS — M9903 Segmental and somatic dysfunction of lumbar region: Secondary | ICD-10-CM | POA: Diagnosis not present

## 2021-01-11 DIAGNOSIS — M9903 Segmental and somatic dysfunction of lumbar region: Secondary | ICD-10-CM | POA: Diagnosis not present

## 2021-01-11 DIAGNOSIS — M5414 Radiculopathy, thoracic region: Secondary | ICD-10-CM | POA: Diagnosis not present

## 2021-01-11 DIAGNOSIS — M545 Low back pain, unspecified: Secondary | ICD-10-CM | POA: Diagnosis not present

## 2021-01-11 DIAGNOSIS — M9902 Segmental and somatic dysfunction of thoracic region: Secondary | ICD-10-CM | POA: Diagnosis not present

## 2021-01-15 DIAGNOSIS — Z79899 Other long term (current) drug therapy: Secondary | ICD-10-CM | POA: Diagnosis not present

## 2021-01-17 DIAGNOSIS — L4050 Arthropathic psoriasis, unspecified: Principal | ICD-10-CM

## 2021-01-17 MED ORDER — GABAPENTIN 300 MG CAPSULE
ORAL_CAPSULE | 0 refills | 0 days | Status: CP
Start: 2021-01-17 — End: ?

## 2021-01-21 ENCOUNTER — Other Ambulatory Visit: Payer: Self-pay | Admitting: Nurse Practitioner

## 2021-01-21 NOTE — Unmapped (Signed)
The [facility] has made a fourth and final attempt to reach this patient to refill the following medication: methotrexate and Humira.      We have been unable to leave messages on the following phone numbers: [phone] and have sent a MyChart message.    Dates contacted: 01/03/2021; 01/07/2021; 01/14/2021; 01/21/2021  Last scheduled delivery: 12/17/2020    The patient may be at risk of non-compliance with this medication. The patient should call the [facility] at [phone] (option 4) to refill medication.    Karene Fry Amaya Blakeman   [facility] Specialty Pharmacist

## 2021-01-21 NOTE — Telephone Encounter (Signed)
Requested medication (s) are due for refill today:   Yes  Requested medication (s) are on the active medication list:   Yes  Future visit scheduled:   Yes   Last ordered: 09/04/2020 16 g, 1 refill  Returned because there is not a protocol assigned to this medication.   Requested Prescriptions  Pending Prescriptions Disp Refills   STIOLTO RESPIMAT 2.5-2.5 MCG/ACT AERS [Pharmacy Med Name: STIOLTO RESPIMAT 2.5/2.5MCG [place] 4GM] 16 g 1    Sig: INHALE 2 PUFFS BY MOUTH INTO THE LUNGS ONCE DAILY      Off-Protocol Failed - 01/21/2021  3:18 AM      Failed - Medication not assigned to a protocol, review manually.      Passed - Valid encounter within last 12 months    Recent Outpatient Visits           2 months ago Centrilobular emphysema ([place])   [place], [place] T, NP   3 months ago Non-recurrent acute suppurative otitis media of both ears without spontaneous rupture of tympanic membranes   [facility] Rory Percy M, DO   3 months ago Non-recurrent acute suppurative otitis media of both ears without spontaneous rupture of tympanic membranes   [place], [name] T, NP   4 months ago Severe recurrent major depression without psychotic features ([place])   [place], [name] T, NP   4 months ago Acute non-recurrent maxillary sinusitis   [place], Megan P, DO       Future Appointments             In 3 months Cannady, Barbaraann Faster, NP [employer] [facility]   In 9 months  [employer] [facility]

## 2021-01-21 NOTE — Telephone Encounter (Signed)
Scheduled 8/16 

## 2021-02-19 DIAGNOSIS — Z79899 Other long term (current) drug therapy: Secondary | ICD-10-CM | POA: Diagnosis not present

## 2021-02-22 MED ORDER — ACITRETIN 25 MG CAPSULE
ORAL_CAPSULE | 1 refills | 0.00000 days
Start: 2021-02-22 — End: ?

## 2021-02-25 DIAGNOSIS — L409 Psoriasis, unspecified: Principal | ICD-10-CM

## 2021-02-25 MED ORDER — ACITRETIN 25 MG CAPSULE
ORAL_CAPSULE | 1 refills | 0.00000 days
Start: 2021-02-25 — End: ?

## 2021-02-25 NOTE — Unmapped (Unsigned)
Cancelled appointment, erroneous encounter Pertinent Past Medical History     History of Skin Cancer, see below:    Problem List        Musculoskeletal and Integument    Psoriasis        Family History:   Unknown     Past Medical History, Family History, Social History, Medication List, Allergies, and Problem List were reviewed in the rooming section of Epic.     ROS: Other than symptoms mentioned in the HPI, no fevers, chills, or other skin complaints    Physical Examination     GENERAL: Well-appearing female in no acute distress, resting comfortably.  NEURO: Alert and oriented, answers questions appropriately  SKIN (Focal Skin Exam): Per patient request, examination of hands, digits bilaterally, back, chest, face  was performed was performed  - Xerosis with thick scale on palmar aspect bilaterally- improved   - Linear scar on R upper back     All areas not commented on are within normal limits or unremarkable      (Approved Template 04/25/2020)

## 2021-02-26 MED ORDER — ACITRETIN 25 MG CAPSULE
ORAL_CAPSULE | ORAL | 0 refills | 0.00000 days | Status: CP
Start: 2021-02-26 — End: ?

## 2021-02-26 MED ORDER — BD TUBERCULIN SYRINGE 1 ML 27 X 1/2"
PRN refills | 0 days
Start: 2021-02-26 — End: ?

## 2021-02-26 MED ORDER — BD TUBERCULIN SYRINGE 1 ML 25 GAUGE X 5/8"
PRN refills | 0.00000 days | Status: CP
Start: 2021-02-26 — End: ?
  Filled 2021-02-27: qty 4, 28d supply, fill #0

## 2021-02-26 NOTE — Unmapped (Signed)
[facility] Clinical Assessment & Refill Coordination Note    [name], DOB: Jul 26, 1961  Phone: [phone] (home)     All above HIPAA information was verified with patient.     Was a Nurse, learning disability used for this call? No    Specialty Medication(s):   Inflammatory Disorders: Humira and methotrexate (injectable)     Current Outpatient Medications   Medication Sig Dispense Refill   ??? acitretin (SORIATANE) 25 MG capsule Take tablet every other day 30 capsule 0   ??? adalimumab 80 mg/0.8 mL-40 mg/0.4 mL PnKt Inject the contents of 1 pen (80mg ) under the skin once for initial dose, then inject the contents of 1 pen (40mg ) every other week beginning 1 week after initial dose. 3 each 0   ??? albuterol HFA 90 mcg/actuation inhaler Inhale 2 puffs by mouth every six (6) hours as needed for wheezing. 54 g 6   ??? cetirizine (ZYRTEC) 10 MG tablet Take 1 tablet (10 mg total) by mouth daily. 90 tablet 3   ??? clobetasoL (TEMOVATE) 0.05 % ointment Apply topically Two (2) times a day. 120 g 1   ??? clonazePAM (KLONOPIN) 0.5 MG tablet Take 0.5 mg by mouth Three (3) times a day.     ??? clonazePAM (KLONOPIN) 1 MG tablet Take 1 mg by mouth Three (3) times a day.     ??? DULoxetine (CYMBALTA) 60 MG capsule Take 2 capsules (120 mg total) by mouth daily. 60 capsule 3   ??? empty container (BD SHARPS COLLECTOR) Misc Use as directed. 1 each 3   ??? fluticasone propionate (FLONASE) 50 mcg/actuation nasal spray Use 2 sprays in each nostril daily. 16 g 4   ??? gabapentin (NEURONTIN) 300 MG capsule TAKE 1 CAPSULE(300 MG) BY MOUTH THREE TIMES DAILY 270 capsule 0   ??? HUMIRA PEN CITRATE FREE 40 MG/0.4 ML Inject the contents of 1 pen (40mg ) under the skin every 14 days 6 each 3   ??? loratadine (CLARITIN) 10 mg tablet      ??? meclizine (ANTIVERT) 12.5 mg tablet Take 12.5 mg by mouth Three (3) times a day as needed.      ??? methocarbamoL (ROBAXIN) 500 MG tablet Take 1 tablet (500 mg total) by mouth Three (3) times a day as needed. 60 tablet 3   ??? methotrexate 25 mg/mL injection solution Inject 1 mL (25mg ) under the skin once a week 4 mL 3   ??? multivitamin with minerals tablet Take 1 tablet by mouth daily.     ??? senna (SENOKOT) 8.6 mg tablet Take 1 tablet by mouth daily as needed for constipation. take 1 pill as needed for cosntipation may increase to 2 tabs if no relief     ??? syringe with needle (BD TUBERCULIN SYRINGE) 1 mL 25 gauge x 5/8 Syrg Use as directed for weekly methotrexate injections 100 each PRN   ??? tazarotene (AVAGE) 0.1 % cream Mix with clobetasol and apply twice daily to rash on hands 60 g 5   ??? tiotropium bromide (SPIRIVA RESPIMAT) 1.25 mcg/actuation Mist Inhale 2 puffs (2.5 mcg) BY MOUTH daily. 4 g 5   ??? tiotropium-olodateroL (STIOLTO RESPIMAT) 2.5-2.5 mcg/actuation Mist Inhale 2 puffs daily.     ??? traZODone (DESYREL) 50 MG tablet Take 50 mg by mouth nightly. takes 2 tabs      ??? VYVANSE 30 mg capsule        No current facility-administered medications for this visit.  Changes to medications: [name] reports no changes at this time.    No Known Allergies    Changes to allergies: No    SPECIALTY MEDICATION ADHERENCE     Humira 40mg /0.6mL : 1 days of medicine on hand   Methotrexate 25 mg/ml: 1 days of medicine on hand       Medication Adherence    Patient reported X missed doses in the last month: 0  Specialty Medication: Humira 40mg /0.23mL  Patient is on additional specialty medications: Yes  Additional Specialty Medications: Methotrexate 25 mg/mL  Patient Reported Additional Medication X Missed Doses in the Last Month: 0  Informant: patient          Specialty medication(s) dose(s) confirmed: Regimen is correct and unchanged.     Are there any concerns with adherence? Yes: Last fill for both medications was 12/17/20 for a 28 day supply. Patient says she has not missed any doses due to having extra medication on hand.     Adherence counseling provided? Yes: Discussed importance of adherence to medication. Mrs Roxan Hockey reports she doesn't remember getting calls/ messages from [facility] for refills in April and May. I verified her contact information and encourage her to call us if she is low on medication and has not received a message. She voices understanding.    CLINICAL MANAGEMENT AND INTERVENTION      Clinical Benefit Assessment:    Do you feel the medicine is effective or helping your condition? Yes    Clinical Benefit counseling provided? Not needed    Adverse Effects Assessment:    Are you experiencing any side effects? No    Are you experiencing difficulty administering your medicine? No    Quality of Life Assessment:    Rheumatology:   Quality of Life    On a scale of 1 - 10 with 1 representing not at all and 10 representing completely - how has your rheumatologic condition affected your:  Daily pain level?: decline to answer  Ability to complete your regular daily tasks (prepare meals, get dressed, etc.)?: decline to answer  Ability to participate in social or family activities?: decline to answer         Have you discussed this with your provider? Not needed    Acute Infection Status:    Acute infections noted within Epic:  No active infections  Patient reported infection: None    Therapy Appropriateness:    Is therapy appropriate? Yes, therapy is appropriate and should be continued    DISEASE/MEDICATION-SPECIFIC INFORMATION      For patients on injectable medications: Patient currently has 0 Humira and methotrexate doses left.  Next injection is scheduled for 02/27/21.    PATIENT SPECIFIC NEEDS     - Does the patient have any physical, cognitive, or cultural barriers? No    - Is the patient high risk? No    - Does the patient require a Care Management Plan? No     - Does the patient require physician intervention or other additional services (i.e. nutrition, smoking cessation, social work)? No      SHIPPING     Specialty Medication(s) to be Shipped:   Inflammatory Disorders: Humira and methotrexate (injectable)    Other medication(s) to be shipped: Syringes and fluticasone     Changes to insurance: No    Delivery Scheduled: Yes, Expected medication delivery date: 02/27/21.  However, Rx request for syringe refills was sent to the provider as there are none remaining.     Medication  will be delivered via Same Day Courier to the confirmed prescription address in [facility].    The patient will receive a drug information handout for each medication shipped and additional FDA Medication Guides as required.  Verified that patient has previously received a Conservation officer, historic buildings and a Surveyor, mining.    The patient or caregiver noted above participated in the development of this care plan and knows that they can request review of or adjustments to the care plan at any time.      All of the patient's questions and concerns have been addressed.    Camillo Flaming   [facility] Specialty Pharmacist

## 2021-02-26 NOTE — Unmapped (Signed)
Pt cancelled appointment on 02/25/21. Pt needs follow up before further acitretin refills will be given.

## 2021-02-27 MED FILL — FLUTICASONE PROPIONATE 50 MCG/ACTUATION NASAL SPRAY,SUSPENSION: NASAL | 30 days supply | Qty: 16 | Fill #2

## 2021-02-27 MED FILL — HUMIRA PEN CITRATE FREE 40 MG/0.4 ML: SUBCUTANEOUS | 28 days supply | Qty: 2 | Fill #5

## 2021-02-27 MED FILL — METHOTREXATE SODIUM 25 MG/ML INJECTION SOLUTION: SUBCUTANEOUS | 28 days supply | Qty: 4 | Fill #3

## 2021-03-19 DIAGNOSIS — Z79899 Other long term (current) drug therapy: Secondary | ICD-10-CM | POA: Diagnosis not present

## 2021-03-26 DIAGNOSIS — L4050 Arthropathic psoriasis, unspecified: Principal | ICD-10-CM

## 2021-03-26 MED ORDER — METHOTREXATE SODIUM 25 MG/ML INJECTION SOLUTION
SUBCUTANEOUS | 3 refills | 28.00000 days
Start: 2021-03-26 — End: ?

## 2021-03-26 NOTE — Unmapped (Signed)
[facility] Clinical Assessment & Refill Coordination Note    Michele Miller, DOB: [birth year]  Phone: [phone] (home)     All above HIPAA information was verified with patient.     Was a Nurse, learning disability used for this call? No    Specialty Medication(s):   Inflammatory Disorders: Humira and methotrexate (injectable)     Current Outpatient Medications   Medication Sig Dispense Refill   ??? acitretin (SORIATANE) 25 MG capsule Take tablet every other day 30 capsule 0   ??? adalimumab 80 mg/0.8 mL-40 mg/0.4 mL PnKt Inject the contents of 1 pen (80mg ) under the skin once for initial dose, then inject the contents of 1 pen (40mg ) every other week beginning 1 week after initial dose. 3 each 0   ??? albuterol HFA 90 mcg/actuation inhaler Inhale 2 puffs by mouth every six (6) hours as needed for wheezing. 54 g 6   ??? cetirizine (ZYRTEC) 10 MG tablet Take 1 tablet (10 mg total) by mouth daily. 90 tablet 3   ??? clobetasoL (TEMOVATE) 0.05 % ointment Apply topically Two (2) times a day. 120 g 1   ??? clonazePAM (KLONOPIN) 0.5 MG tablet Take 0.5 mg by mouth Three (3) times a day.     ??? clonazePAM (KLONOPIN) 1 MG tablet Take 1 mg by mouth Three (3) times a day.     ??? DULoxetine (CYMBALTA) 60 MG capsule Take 2 capsules (120 mg total) by mouth daily. 60 capsule 3   ??? empty container (BD SHARPS COLLECTOR) Misc Use as directed. 1 each 3   ??? fluticasone propionate (FLONASE) 50 mcg/actuation nasal spray Use 2 sprays in each nostril daily. 16 g 4   ??? gabapentin (NEURONTIN) 300 MG capsule TAKE 1 CAPSULE(300 MG) BY MOUTH THREE TIMES DAILY 270 capsule 0   ??? HUMIRA PEN CITRATE FREE 40 MG/0.4 ML Inject the contents of 1 pen (40mg ) under the skin every 14 days 6 each 3   ??? loratadine (CLARITIN) 10 mg tablet      ??? meclizine (ANTIVERT) 12.5 mg tablet Take 12.5 mg by mouth Three (3) times a day as needed.      ??? methocarbamoL (ROBAXIN) 500 MG tablet Take 1 tablet (500 mg total) by mouth Three (3) times a day as needed. 60 tablet 3   ??? methotrexate 25 mg/mL injection solution Inject 1 mL (25mg ) under the skin once a week 4 mL 3   ??? multivitamin with minerals tablet Take 1 tablet by mouth daily.     ??? senna (SENOKOT) 8.6 mg tablet Take 1 tablet by mouth daily as needed for constipation. take 1 pill as needed for cosntipation may increase to 2 tabs if no relief     ??? syringe with needle (BD TUBERCULIN SYRINGE) 1 mL 25 gauge x 5/8 Syrg Use as directed for weekly methotrexate injections 100 each PRN   ??? tazarotene (AVAGE) 0.1 % cream Mix with clobetasol and apply twice daily to rash on hands 60 g 5   ??? tiotropium bromide (SPIRIVA RESPIMAT) 1.25 mcg/actuation Mist Inhale 2 puffs (2.5 mcg) BY MOUTH daily. 4 g 5   ??? tiotropium-olodateroL (STIOLTO RESPIMAT) 2.5-2.5 mcg/actuation Mist Inhale 2 puffs daily.     ??? traZODone (DESYREL) 50 MG tablet Take 50 mg by mouth nightly. takes 2 tabs      ??? VYVANSE 30 mg capsule        No current facility-administered medications for this visit.  Changes to medications: Michele Miller reports no changes at this time.    No Known Allergies    Changes to allergies: No    SPECIALTY MEDICATION ADHERENCE     Humira 40mg /0.10mL : 22 days of medicine on hand   Methotrexate 25 mg/ml: 8 days of medicine on hand        Medication Adherence    Patient reported X missed doses in the last month: 0  Specialty Medication: Humira 40mg /0.71mL  Patient is on additional specialty medications: Yes  Additional Specialty Medications: Methotrexate 25mg /mL  Patient Reported Additional Medication X Missed Doses in the Last Month: 0  Informant: patient          Specialty medication(s) dose(s) confirmed: Regimen is correct and unchanged.     Are there any concerns with adherence? No    Adherence counseling provided? Not needed    CLINICAL MANAGEMENT AND INTERVENTION      Clinical Benefit Assessment:    Do you feel the medicine is effective or helping your condition? Yes    Clinical Benefit counseling provided? Not needed    Adverse Effects Assessment:    Are you experiencing any side effects? No    Are you experiencing difficulty administering your medicine? No    Quality of Life Assessment:    Quality of Life    Rheumatology  On a scale of 1 - 10 with 1 representing not at all and 10 representing completely - how has your rheumatologic condition affected your:  Daily pain level?: decline to answer  Ability to complete your regular daily tasks (prepare meals, get dressed, etc.)?: decline to answer  Ability to participate in social or family activities?: decline to answer  Oncology  Dermatology               Have you discussed this with your provider? Not needed    Acute Infection Status:    Acute infections noted within Epic:  No active infections  Patient reported infection: None    Therapy Appropriateness:    Is therapy appropriate? Yes, therapy is appropriate and should be continued    DISEASE/MEDICATION-SPECIFIC INFORMATION      For patients on injectable medications:   Patient currently has 1 Humira dose left.  Next injection is scheduled for 04/03/21.  Patient currently has 1 Methotrexate dose left.  Next injection is scheduled for 03/27/21.    PATIENT SPECIFIC NEEDS     - Does the patient have any physical, cognitive, or cultural barriers? No    - Is the patient high risk? No    - Does the patient require a Care Management Plan? No     - Does the patient require physician intervention or other additional services (i.e. nutrition, smoking cessation, social work)? No      SHIPPING     Specialty Medication(s) to be Shipped:   Inflammatory Disorders: Humira and methotrexate (injectable)    Other medication(s) to be shipped: Syringes, Fluticasone nasal spray     Changes to insurance: No    Delivery Scheduled: Yes, Expected medication delivery date: 03/29/21.  However, Rx request for methotrexate refills was sent to the provider as there are none remaining.     Medication will be delivered via Same Day Courier to the confirmed prescription address in [facility].    The patient will receive a drug information handout for each medication shipped and additional FDA Medication Guides as required.  Verified that patient has previously received a Conservation officer, historic buildings and a Notice of  Chief Technology Officer.    The patient or caregiver noted above participated in the development of this care plan and knows that they can request review of or adjustments to the care plan at any time.      All of the patient's questions and concerns have been addressed.    Camillo Flaming   [facility] Specialty Pharmacist

## 2021-03-27 MED ORDER — METHOTREXATE SODIUM 25 MG/ML INJECTION SOLUTION
SUBCUTANEOUS | 0 refills | 28.00000 days | Status: CP
Start: 2021-03-27 — End: ?

## 2021-03-27 NOTE — Unmapped (Signed)
Patient needs labs for further refills.  Follow up 05/09/21 as scheduled  AEN, mD

## 2021-03-29 MED FILL — FLUTICASONE PROPIONATE 50 MCG/ACTUATION NASAL SPRAY,SUSPENSION: NASAL | 30 days supply | Qty: 16 | Fill #3

## 2021-03-29 MED FILL — HUMIRA PEN CITRATE FREE 40 MG/0.4 ML: SUBCUTANEOUS | 28 days supply | Qty: 2 | Fill #6

## 2021-03-29 MED FILL — BD TUBERCULIN SYRINGE 1 ML 25 GAUGE X 5/8": 28 days supply | Qty: 4 | Fill #1

## 2021-03-29 MED FILL — METHOTREXATE SODIUM 25 MG/ML INJECTION SOLUTION: SUBCUTANEOUS | 28 days supply | Qty: 4 | Fill #0

## 2021-04-02 NOTE — Unmapped (Signed)
Attempted to contact pt about labs due before next refill. No answer, unable to leave VM.

## 2021-04-23 DIAGNOSIS — L4050 Arthropathic psoriasis, unspecified: Principal | ICD-10-CM

## 2021-04-23 MED ORDER — GABAPENTIN 300 MG CAPSULE
ORAL_CAPSULE | 0 refills | 0 days | Status: CP
Start: 2021-04-23 — End: ?

## 2021-04-29 NOTE — Unmapped (Signed)
The [facility] has made a second and final attempt to reach this patient to refill the following medication:Humira & Methotrexate    Dates contacted: 8/5, 8/15  Last scheduled delivery: 7/15    The patient may be at risk of non-compliance with this medication. The patient should call the [facility] at [phone]  Option 4, then Option 2 (all other specialty patients) to refill medication.    [name]   [facility] Specialty Technician

## 2021-04-30 ENCOUNTER — Encounter: Payer: Self-pay | Admitting: Nurse Practitioner

## 2021-04-30 ENCOUNTER — Other Ambulatory Visit: Payer: Self-pay

## 2021-04-30 ENCOUNTER — Ambulatory Visit (INDEPENDENT_AMBULATORY_CARE_PROVIDER_SITE_OTHER): Payer: Medicare Other | Admitting: Nurse Practitioner

## 2021-04-30 VITALS — BP 115/79 | HR 89 | Temp 98.7°F | Ht 63.27 in | Wt 126.5 lb

## 2021-04-30 DIAGNOSIS — Z803 Family history of malignant neoplasm of breast: Secondary | ICD-10-CM | POA: Diagnosis not present

## 2021-04-30 DIAGNOSIS — F4310 Post-traumatic stress disorder, unspecified: Secondary | ICD-10-CM

## 2021-04-30 DIAGNOSIS — I700 Atherosclerosis of aorta: Secondary | ICD-10-CM | POA: Diagnosis not present

## 2021-04-30 DIAGNOSIS — F332 Major depressive disorder, recurrent severe without psychotic features: Secondary | ICD-10-CM

## 2021-04-30 DIAGNOSIS — L4050 Arthropathic psoriasis, unspecified: Secondary | ICD-10-CM

## 2021-04-30 DIAGNOSIS — Z1211 Encounter for screening for malignant neoplasm of colon: Secondary | ICD-10-CM | POA: Diagnosis not present

## 2021-04-30 DIAGNOSIS — F17219 Nicotine dependence, cigarettes, with unspecified nicotine-induced disorders: Secondary | ICD-10-CM | POA: Diagnosis not present

## 2021-04-30 DIAGNOSIS — J432 Centrilobular emphysema: Secondary | ICD-10-CM | POA: Diagnosis not present

## 2021-04-30 DIAGNOSIS — M797 Fibromyalgia: Secondary | ICD-10-CM | POA: Diagnosis not present

## 2021-04-30 DIAGNOSIS — M25511 Pain in right shoulder: Secondary | ICD-10-CM | POA: Diagnosis not present

## 2021-04-30 MED ORDER — TIZANIDINE HCL 4 MG PO TABS: 4.0000 mg | ORAL_TABLET | Freq: Four times a day (QID) | ORAL | 0 refills | Status: DC | PRN

## 2021-04-30 NOTE — Assessment & Plan Note (Signed)
Chronic, ongoing.  Continue current medication regimen and collaboration with psychiatry + therapy sessions, which patient has reported benefit from.  Denies SI/HI.

## 2021-04-30 NOTE — Assessment & Plan Note (Signed)
Chronic, ongoing.  Continue current medication regimen and collaboration with psychiatry and therapist. Denies SI/HI.  She is aware all psychiatric medications are to be filled by her psychiatrist and she needs to ensure continued care with them.  Return in 6 months.

## 2021-04-30 NOTE — Assessment & Plan Note (Signed)
Chronic, stable.  Continue current medication regimen and adjust as needed. Spirometry due, will obtain next visit.  Recommend complete cessation smoking.  Continue annual CT screening.  Return in 6 months for physical and spirometry.

## 2021-04-30 NOTE — Assessment & Plan Note (Signed)
Chronic, ongoing.  Continue Duloxetine for mood and pain, adjust medications as needed.  Return in 6 months

## 2021-04-30 NOTE — Assessment & Plan Note (Signed)
Order for mammogram, highly recommend she obtain.

## 2021-04-30 NOTE — Assessment & Plan Note (Signed)
Discussed at length with patient, have recommended complete cessation of smoking and educated patient on risk with smoking.  Consider addition of statin and ASA in future.

## 2021-04-30 NOTE — Patient Instructions (Addendum)
[facility] at [facility]  Address: [address], [place], [place] [address]  Phone: [phone]   Chronic Obstructive Pulmonary Disease  Chronic obstructive pulmonary disease (COPD) is a long-term (chronic) lung problem. When you have COPD, it is hard for air to get in and out ofyour lungs. Usually the condition gets worse over time, and your lungs will never return tonormal. There are things you can do to keep yourself as healthy as possible. What are the causes? Smoking. This is the most common cause. Certain genes passed from parent to child (inherited). What increases the risk? Being exposed to secondhand smoke from cigarettes, pipes, or cigars. Being exposed to chemicals and other irritants, such as fumes and dust in the work environment. Having chronic lung conditions or infections. What are the signs or symptoms? Shortness of breath, especially during physical activity. A long-term cough with a large amount of thick mucus. Sometimes, the cough may not have any mucus (dry cough). Wheezing. Breathing quickly. Skin that looks gray or blue, especially in the fingers, toes, or lips. Feeling tired (fatigue). Weight loss. Chest tightness. Having infections often. Episodes when breathing symptoms become much worse (exacerbations). At the later stages of this disease, you may have swelling in the ankles, feet,or legs. How is this treated? Taking medicines. Quitting smoking, if you smoke. Rehabilitation. This includes steps to make your body work better. It may involve a team of specialists. Doing exercises. Making changes to your diet. Using oxygen. Lung surgery. Lung transplant. Comfort measures (palliative care). Follow these instructions at home: Medicines Take over-the-counter and prescription medicines only as told by your doctor. Talk to your doctor before taking any cough or allergy medicines. You may need to avoid medicines that cause your  lungs to be dry. Lifestyle If you smoke, stop smoking. Smoking makes the problem worse. Do not smoke or use any products that contain nicotine or tobacco. If you need help quitting, ask your doctor. Avoid being around things that make your breathing worse. This may include smoke, chemicals, and fumes. Stay active, but remember to rest as well. Learn and use tips on how to manage stress and control your breathing. Make sure you get enough sleep. Most adults need at least 7 hours of sleep every night. Eat healthy foods. Eat smaller meals more often. Rest before meals. Controlled breathing Learn and use tips on how to control your breathing as told by your doctor. Try: Breathing in (inhaling) through your nose for 1 second. Then, pucker your lips and breath out (exhale) through your lips for 2 seconds. Putting one hand on your belly (abdomen). Breathe in slowly through your nose for 1 second. Your hand on your belly should move out. Pucker your lips and breathe out slowly through your lips. Your hand on your belly should move in as you breathe out.  Controlled coughing Learn and use controlled coughing to clear mucus from your lungs. Follow these steps: Lean your head a little forward. Breathe in deeply. Try to hold your breath for 3 seconds. Keep your mouth slightly open while coughing 2 times. Spit any mucus out into a tissue. Rest and do the steps again 1 or 2 times as needed. General instructions Make sure you get all the shots (vaccines) that your doctor recommends. Ask your doctor about a flu shot and a pneumonia shot. Use oxygen therapy and pulmonary rehabilitation if told by your doctor. If you need home oxygen therapy, ask your doctor if you should buy  a tool to measure your oxygen level (oximeter). Make a COPD action plan with your doctor. This helps you to know what to do if you feel worse than usual. Manage any other conditions you have as told by your doctor. Avoid going outside  when it is very hot, cold, or humid. Avoid people who have a sickness you can catch (contagious). Keep all follow-up visits. Contact a doctor if: You cough up more mucus than usual. There is a change in the color or thickness of the mucus. It is harder to breathe than usual. Your breathing is faster than usual. You have trouble sleeping. You need to use your medicines more often than usual. You have trouble doing your normal activities such as getting dressed or walking around the house. Get help right away if: You have shortness of breath while resting. You have shortness of breath that stops you from: Being able to talk. Doing normal activities. Your chest hurts for longer than 5 minutes. Your skin color is more blue than usual. Your pulse oximeter shows that you have low oxygen for longer than 5 minutes. You have a fever. You feel too tired to breathe normally. These symptoms may represent a serious problem that is an emergency. Do not wait to see if the symptoms will go away. Get medical help right away. Call your local emergency services (911 in the [place] Do not drive yourself to the hospital. Summary Chronic obstructive pulmonary disease (COPD) is a long-term lung problem. The way your lungs work will never return to normal. Usually the condition gets worse over time. There are things you can do to keep yourself as healthy as possible. Take over-the-counter and prescription medicines only as told by your doctor. If you smoke, stop. Smoking makes the problem worse. This information is not intended to replace advice given to you by your health care provider. Make sure you discuss any questions you have with your healthcare provider. Document Revised: 07/10/2020 Document Reviewed: 07/10/2020 Elsevier Patient Education  2022 [employer].

## 2021-04-30 NOTE — Assessment & Plan Note (Signed)
Continue collaboration with rheumatology & dermatology at [facility] and current medication regimen.  Reviewed recent notes.  Will obtain CBC and CMP today.

## 2021-04-30 NOTE — Assessment & Plan Note (Signed)
Acute and ongoing, ?impingement vs rotator cuff.  She would like ortho referral.  This has been placed and script for Tizanidine sent.  Return for worsening or ongoing pain.

## 2021-04-30 NOTE — Assessment & Plan Note (Signed)
I have recommended complete cessation of tobacco use. I have discussed various options available for assistance with tobacco cessation including over the counter methods (Nicotine gum, patch and lozenges). We also discussed prescription options (Chantix, Nicotine Inhaler / Nasal Spray). The patient is not interested in pursuing any prescription tobacco cessation options at this time.  

## 2021-04-30 NOTE — Progress Notes (Signed)
BP 115/79   Pulse 89   Temp 98.7 F (37.1 C)   Ht 5' 3.27" (1.607 m)   Wt 126 lb 8 oz (57.4 kg)   SpO2 97%   BMI 22.22 kg/m    Subjective:    Patient ID: Emily Johnston, female    DOB: [birth year], 60 y.o.   MRN: [id number]  HPI: Emily Johnston is a 60 y.o. female  Chief Complaint  Patient presents with   COPD   Arthritis   Mood follow Up   COPD Continues to smoke -- was smoking 1 1/2 PPD and now 1 PPD, has been smoking since age 19. Continues on Stiolto daily and Albuterol as needed.  Had lung CT CA screening 06/25/20, which was benign, but did note aortic atherosclerosis and emphysema.   COPD status: stable Satisfied with current treatment?: yes Oxygen use: no Dyspnea frequency: none Cough frequency: occasional Rescue inhaler frequency:  a few times a month Limitation of activity: no Productive cough: none Last Spirometry: 03/22/2019 Pneumovax: Up to Date Influenza: Up to Date    PSORIATIC ARTHRITIS: Followed by rheumatology at [facility] and last seen 10/28/21and dermatology last 08/27/2020.  She continues on Methotrexate weekly, Humira every two weeks, and folic acid.  This regimen has offered some benefit.  She is aware to schedule follow-up with them.   SHOULDER PAIN Ongoing pain for the past 5 weeks, worse over the past 2 weeks.  Can not lay on it or put bra on.  No recent falls or injuries. Duration: weeks Involved shoulder: right Mechanism of injury: unknown Location: lateral Onset:gradual Severity: 10/10 at worst Quality:  sharp, aching, and throbbing Frequency: constant Radiation: no Aggravating factors: lifting, movement, and sleep  Alleviating factors: nothing  Status: worse Treatments attempted: rest  Relief with NSAIDs?:  No NSAIDs Taken Weakness: yes Numbness: no Decreased grip strength: no Redness: no Swelling: no Bruising: no Fevers: no    FIBROMYALGIA Continues on Duloxetine.   Pain status: stable Satisfied with current  treatment?: yes Previous pain specialty evaluation: no Non-narcotic analgesic meds: yes Narcotic contract:no Treatments attempted: rest, APAP, ibuprofen and physical therapy     DEPRESSION/PTSD Currently followed by psychiatry team, [facility] in [place]. Last saw a few weeks ago.  Also seeing therapy at this location, who she has a good rapport.  Continues Vyvanse, Trazodone, Zyprexa, Cymbalta, and Klonopin. Mood status: stable Satisfied with current treatment?: yes Symptom severity: moderate Duration of current treatment : chronic Side effects: no Medication compliance: good compliance Psychotherapy/counseling: yes current Depressed mood: yes Anxious mood: yes Anhedonia: no Significant weight loss or gain: no Insomnia: yes hard to stay asleep Fatigue: no Feelings of worthlessness or guilt: no Impaired concentration/indecisiveness: yes Suicidal ideations: no Hopelessness: no Crying spells: no Depression screen [facility] 2/9 04/30/2021 10/29/2020 09/19/2020 08/13/2020 07/10/2020  Decreased Interest '3 3 3 3 1  '$ Down, Depressed, Hopeless '3 3 3 3 1  '$ PHQ - 2 Score '6 6 6 6 2  '$ Altered sleeping 2 - '3 2 2  '$ Tired, decreased energy '2 2 2 3 '$ 0  Change in appetite '2 3 2 3 1  '$ Feeling bad or failure about yourself  '1 3 2 2 1  '$ Trouble concentrating 3 3 - 2 0  Moving slowly or fidgety/restless 1 3 0 3 0  Suicidal thoughts 0 0 1 0 0  PHQ-9 Score 17 - '16 21 6  '$ Difficult doing work/chores Somewhat difficult - - Extremely dIfficult Not difficult at all  Some recent data  might be hidden    GAD 7 : Generalized Anxiety Score 04/30/2021 08/13/2020 06/01/2019 03/22/2019  Nervous, Anxious, on Edge '2 3 2 3  '$ Control/stop worrying '2 3 1 3  '$ Worry too much - different things '2 3 1 3  '$ Trouble relaxing '3 3 1 3  '$ Restless '3 2 1 3  '$ Easily annoyed or irritable 2 3 0 2  Afraid - awful might happen 1 3 0 2  Total GAD 7 Score '15 20 6 19  '$ Anxiety Difficulty Somewhat difficult Extremely difficult Somewhat  difficult Very difficult      Relevant past medical, surgical, family and social history reviewed and updated as indicated. Interim medical history since our last visit reviewed. Allergies and medications reviewed and updated.  Review of Systems  Constitutional:  Negative for activity change, appetite change, diaphoresis, fatigue and fever.  Respiratory:  Negative for cough, chest tightness and shortness of breath.   Cardiovascular:  Negative for chest pain, palpitations and leg swelling.  Gastrointestinal: Negative.   Endocrine: Negative for cold intolerance and heat intolerance.  Musculoskeletal:  Positive for arthralgias.  Neurological: Negative.   Psychiatric/Behavioral: Negative.     Per HPI unless specifically indicated above     Objective:    BP 115/79   Pulse 89   Temp 98.7 F (37.1 C)   Ht 5' 3.27" (1.607 m)   Wt 126 lb 8 oz (57.4 kg)   SpO2 97%   BMI 22.22 kg/m   Wt Readings from Last 3 Encounters:  04/30/21 126 lb 8 oz (57.4 kg)  10/29/20 130 lb 3.2 oz (59.1 kg)  10/10/20 130 lb (59 kg)    Physical Exam Vitals and nursing note reviewed.  Constitutional:      General: She is awake. She is not in acute distress.    Appearance: She is well-developed and well-groomed. She is not ill-appearing.  HENT:     Head: Normocephalic.     Right Ear: Hearing normal.     Left Ear: Hearing normal.  Eyes:     General: Lids are normal.        Right eye: No discharge.        Left eye: No discharge.     Conjunctiva/sclera: Conjunctivae normal.     Pupils: Pupils are equal, round, and reactive to light.  Neck:     Thyroid: No thyromegaly.     Vascular: No carotid bruit.  Cardiovascular:     Rate and Rhythm: Normal rate and regular rhythm.     Heart sounds: Normal heart sounds. No murmur heard.   No gallop.  Pulmonary:     Effort: Pulmonary effort is normal. No accessory muscle usage or respiratory distress.     Breath sounds: Normal breath sounds.  Abdominal:      General: Bowel sounds are normal. There is no distension.     Palpations: Abdomen is soft. There is no hepatomegaly.     Tenderness: There is no abdominal tenderness. There is no right CVA tenderness or left CVA tenderness.  Musculoskeletal:     Right shoulder: Tenderness (to deltoid, lateral area) present. No swelling. Decreased range of motion. Decreased strength.     Left shoulder: Normal.     Cervical back: Normal range of motion and neck supple.     Right lower leg: No edema.     Left lower leg: No edema.     Comments: Decreased ROM with pain noted.  Positive Hawkins.  Skin:    General: Skin is  warm and dry.  Neurological:     Mental Status: She is alert and oriented to person, place, and time.  Psychiatric:        Attention and Perception: Attention normal.        Mood and Affect: Mood normal.        Speech: Speech normal.        Behavior: Behavior normal. Behavior is cooperative.        Thought Content: Thought content normal.    Results for orders placed or performed in visit on 10/29/20  Comprehensive metabolic panel  Result Value Ref Range   Glucose 86 65 - 99 mg/dL   BUN 13 6 - 24 mg/dL   Creatinine, Ser 0.79 0.57 - 1.00 mg/dL   GFR calc non Af Amer 83 >59 mL/min/1.73   GFR calc Af Amer 95 >59 mL/min/1.73   BUN/Creatinine Ratio 16 9 - 23   Sodium 145 (H) 134 - 144 mmol/L   Potassium 4.0 3.5 - 5.2 mmol/L   Chloride 106 96 - 106 mmol/L   CO2 23 20 - 29 mmol/L   Calcium 9.3 8.7 - 10.2 mg/dL   Total Protein 6.0 6.0 - 8.5 g/dL   Albumin 4.2 3.8 - 4.9 g/dL   Globulin, Total 1.8 1.5 - 4.5 g/dL   Albumin/Globulin Ratio 2.3 (H) 1.2 - 2.2   Bilirubin Total <0.2 0.0 - 1.2 mg/dL   Alkaline Phosphatase 76 44 - 121 IU/L   AST 36 0 - 40 IU/L   ALT 31 0 - 32 IU/L  CBC with Differential/Platelet  Result Value Ref Range   WBC 6.8 3.4 - 10.8 x10E3/uL   RBC 4.34 3.77 - 5.28 x10E6/uL   Hemoglobin 14.1 11.1 - 15.9 g/dL   Hematocrit 43.5 34.0 - 46.6 %   MCV 100 (H) 79 - 97 fL    MCH 32.5 26.6 - 33.0 pg   MCHC 32.4 31.5 - 35.7 g/dL   RDW 12.6 11.7 - 15.4 %   Platelets 268 150 - 450 x10E3/uL   Neutrophils 54 Not Estab. %   Lymphs 37 Not Estab. %   Monocytes 8 Not Estab. %   Eos 1 Not Estab. %   Basos 0 Not Estab. %   Neutrophils Absolute 3.7 1.4 - 7.0 x10E3/uL   Lymphocytes Absolute 2.5 0.7 - 3.1 x10E3/uL   Monocytes Absolute 0.5 0.1 - 0.9 x10E3/uL   EOS (ABSOLUTE) 0.1 0.0 - 0.4 x10E3/uL   Basophils Absolute 0.0 0.0 - 0.2 x10E3/uL   Immature Granulocytes 0 Not Estab. %   Immature Grans (Abs) 0.0 0.0 - 0.1 x10E3/uL  Lipid Panel w/o Chol/HDL Ratio  Result Value Ref Range   Cholesterol, Total 231 (H) 100 - 199 mg/dL   Triglycerides 157 (H) 0 - 149 mg/dL   HDL 59 >39 mg/dL   VLDL Cholesterol Cal 28 5 - 40 mg/dL   LDL Chol Calc (NIH) 144 (H) 0 - 99 mg/dL  TSH  Result Value Ref Range   TSH 0.674 0.450 - 4.500 uIU/mL  Hepatitis C antibody  Result Value Ref Range   Hep C Virus Ab 0.1 0.0 - 0.9 s/co ratio  HIV Antibody (routine testing w rflx)  Result Value Ref Range   HIV Screen 4th Generation wRfx Non Reactive Non Reactive      Assessment & Plan:   Problem List Items Addressed This Visit       Cardiovascular and Mediastinum   Aortic atherosclerosis ([place])    Discussed at length  with patient, have recommended complete cessation of smoking and educated patient on risk with smoking.  Consider addition of statin and ASA in future.        Respiratory   Centrilobular emphysema (HCC) - Primary    Chronic, stable.  Continue current medication regimen and adjust as needed. Spirometry due, will obtain next visit.  Recommend complete cessation smoking.  Continue annual CT screening.  Return in 6 months for physical and spirometry.      Relevant Orders   CBC with Differential/Platelet     Nervous and Auditory   Nicotine dependence, cigarettes, w unsp disorders    I have recommended complete cessation of tobacco use. I have discussed various options available  for assistance with tobacco cessation including over the counter methods (Nicotine gum, patch and lozenges). We also discussed prescription options (Chantix, Nicotine Inhaler / Nasal Spray). The patient is not interested in pursuing any prescription tobacco cessation options at this time.         Musculoskeletal and Integument   Psoriatic arthritis ([place])    Continue collaboration with rheumatology & dermatology at [facility] and current medication regimen.  Reviewed recent notes.  Will obtain CBC and CMP today.      Relevant Medications   tiZANidine (ZANAFLEX) 4 MG tablet   Other Relevant Orders   Comprehensive metabolic panel   Lipid Panel w/o Chol/HDL Ratio     Other   Severe recurrent major depression without psychotic features (HCC)    Chronic, ongoing.  Continue current medication regimen and collaboration with psychiatry and therapist. Denies SI/HI.  She is aware all psychiatric medications are to be filled by her psychiatrist and she needs to ensure continued care with them.  Return in 6 months.      PTSD (post-traumatic stress disorder)    Chronic, ongoing.  Continue current medication regimen and collaboration with psychiatry + therapy sessions, which patient has reported benefit from.  Denies SI/HI.      Fibromyalgia    Chronic, ongoing.  Continue Duloxetine for mood and pain, adjust medications as needed.  Return in 6 months      Relevant Medications   tiZANidine (ZANAFLEX) 4 MG tablet   Family history of breast cancer in mother    Order for mammogram, highly recommend she obtain.      Relevant Orders   MM 3D SCREEN BREAST BILATERAL   Acute pain of right shoulder    Acute and ongoing, ?impingement vs rotator cuff.  She would like ortho referral.  This has been placed and script for Tizanidine sent.  Return for worsening or ongoing pain.      Relevant Orders   Ambulatory referral to Orthopedics   Other Visit Diagnoses     Colon cancer screening       Referral to GI  placed   Relevant Orders   Ambulatory referral to Gastroenterology        Follow up plan: Return in about 6 months (around 10/31/2021) for Annual physical.

## 2021-05-04 ENCOUNTER — Encounter: Payer: Self-pay | Admitting: Nurse Practitioner

## 2021-05-04 DIAGNOSIS — E782 Mixed hyperlipidemia: Secondary | ICD-10-CM | POA: Insufficient documentation

## 2021-05-04 DIAGNOSIS — E7800 Pure hypercholesterolemia, unspecified: Secondary | ICD-10-CM | POA: Insufficient documentation

## 2021-05-04 LAB — COMPREHENSIVE METABOLIC PANEL
ALT: 18 IU/L (ref 0–32)
AST: 27 IU/L (ref 0–40)
Albumin/Globulin Ratio: 2 (ref 1.2–2.2)
Albumin: 4.3 g/dL (ref 3.8–4.9)
Alkaline Phosphatase: 91 IU/L (ref 44–121)
BUN/Creatinine Ratio: 17 (ref 9–23)
BUN: 15 mg/dL (ref 6–24)
Bilirubin Total: <0.2 mg/dL (ref 0.0–1.2)
CO2: 21 mmol/L (ref 20–29)
Calcium: 9.1 mg/dL (ref 8.7–10.2)
Chloride: 104 mmol/L (ref 96–106)
Creatinine, Ser: 0.9 mg/dL (ref 0.57–1.00)
Globulin, Total: 2.1 g/dL (ref 1.5–4.5)
Glucose: 72 mg/dL (ref 65–99)
Potassium: 4.2 mmol/L (ref 3.5–5.2)
Sodium: 142 mmol/L (ref 134–144)
Total Protein: 6.4 g/dL (ref 6.0–8.5)
eGFR: 74 mL/min/{1.73_m2} (ref >59)

## 2021-05-04 LAB — LIPID PANEL W/O CHOL/HDL RATIO
Cholesterol, Total: 217 mg/dL — ABNORMAL HIGH (ref 100–199)
HDL: 54 mg/dL (ref >39)
LDL Chol Calc (NIH): 143 mg/dL — ABNORMAL HIGH (ref 0–99)
Triglycerides: 111 mg/dL (ref 0–149)
VLDL Cholesterol Cal: 20 mg/dL (ref 5–40)

## 2021-05-04 LAB — CBC WITH DIFFERENTIAL/PLATELET
Basophils Absolute: 0.1 10*3/uL (ref 0.0–0.2)
Basos: 1 %
EOS (ABSOLUTE): 0.2 10*3/uL (ref 0.0–0.4)
Eos: 2 %
Hematocrit: 42.4 % (ref 34.0–46.6)
Hemoglobin: 13.9 g/dL (ref 11.1–15.9)
Immature Grans (Abs): 0 10*3/uL (ref 0.0–0.1)
Immature Granulocytes: 0 %
Lymphocytes Absolute: 2.6 10*3/uL (ref 0.7–3.1)
Lymphs: 31 %
MCH: 32.7 pg (ref 26.6–33.0)
MCHC: 32.8 g/dL (ref 31.5–35.7)
MCV: 100 fL — ABNORMAL HIGH (ref 79–97)
Monocytes Absolute: 0.4 10*3/uL (ref 0.1–0.9)
Monocytes: 5 %
Neutrophils Absolute: 5.1 10*3/uL (ref 1.4–7.0)
Neutrophils: 61 %
Platelets: 297 10*3/uL (ref 150–450)
RBC: 4.25 x10E6/uL (ref 3.77–5.28)
RDW: 14.3 % (ref 11.7–15.4)
WBC: 8.3 10*3/uL (ref 3.4–10.8)

## 2021-05-04 NOTE — Progress Notes (Signed)
Contacted via MyChart The 10-year ASCVD risk score Mikey Bussing DC Jr., et al., 2013) is: 5.7%   Values used to calculate the score:     Age: 60 years     Sex: Female     Is Non-Hispanic African American: No     Diabetic: No     Tobacco smoker: Yes     Systolic Blood Pressure: [id number]     Is BP treated: No     HDL Cholesterol: 54 mg/dL     Total Cholesterol: 217 mg/dL   Good morning Geni Bers, your labs have returned.  Overall they are reassuring, with exception of cholesterol levels.  Your cholesterol is still high, but continued recommendations to make lifestyle changes. Your LDL is above normal. The LDL is the bad cholesterol. Over time and in combination with inflammation and other factors, this contributes to plaque which in turn may lead to stroke and/or heart attack down the road. Sometimes high LDL is primarily genetic, and people might be eating all the right foods but still have high numbers. Other times, there is room for improvement in one's diet and eating healthier can bring this number down and potentially reduce one's risk of heart attack and/or stroke.   To reduce your LDL, Remember - more fruits and vegetables, more fish, and limit red meat and dairy products. More soy, nuts, beans, barley, lentils, oats and plant sterol ester enriched margarine instead of butter. I also encourage eliminating sugar and processed food. Remember, shop on the outside of the grocery store and visit your [employer]. If you would like to talk with me about dietary changes plus or minus medications for your cholesterol, please let me know. We should recheck your cholesterol in 6 months.  Any questions? Keep being stellar!!  Thank you for allowing me to participate in your care.  I appreciate you. Kindest regards, Dwana Garin

## 2021-05-08 DIAGNOSIS — Z79899 Other long term (current) drug therapy: Principal | ICD-10-CM

## 2021-05-08 DIAGNOSIS — L4050 Arthropathic psoriasis, unspecified: Principal | ICD-10-CM

## 2021-05-08 MED ORDER — METHOTREXATE SODIUM 25 MG/ML INJECTION SOLUTION
SUBCUTANEOUS | 0 refills | 28.00000 days
Start: 2021-05-08 — End: ?

## 2021-05-08 NOTE — Unmapped (Signed)
[facility] Refill Coordination Note    Specialty Medication(s) to be Shipped:   Inflammatory Disorders: Humira and methotrexate (injectable)    Other medication(s) to be shipped: flonase and syringes     Michele Miller, DOB: 12-01-1960  Phone: [phone] (home)       All above HIPAA information was verified with patient.     Was a Nurse, learning disability used for this call? No    Completed refill call assessment today to schedule patient's medication shipment from the [facility] [phone]).  All relevant notes have been reviewed.     Specialty medication(s) and dose(s) confirmed: Regimen is correct and unchanged.   Changes to medications: [name] reports no changes at this time.  Changes to insurance: No  New side effects reported not previously addressed with a pharmacist or physician: None reported  Questions for the pharmacist: No    Confirmed patient received a Conservation officer, historic buildings and a Surveyor, mining with first shipment. The patient will receive a drug information handout for each medication shipped and additional FDA Medication Guides as required.       DISEASE/MEDICATION-SPECIFIC INFORMATION        For patients on injectable medications: Patient currently has 0 doses left.  Next injection is scheduled for 08/31 - methotrexate & 09-07- Humira.    SPECIALTY MEDICATION ADHERENCE     Medication Adherence    Patient reported X missed doses in the last month: 0  Specialty Medication: Humira  Patient is on additional specialty medications: Yes  Additional Specialty Medications: Methotrexate  Patient Reported Additional Medication X Missed Doses in the Last Month: 0  Patient is on more than two specialty medications: No        Were doses missed due to medication being on hold? No    REFERRAL TO PHARMACIST     Referral to the pharmacist: Not needed      [facility]     Shipping address confirmed in Epic.     Delivery Scheduled: Yes, Expected medication delivery date: 05/13/2021.     Medication will be delivered via Same Day Courier to the prescription address in Epic WAM.    [name]   [facility] Specialty Technician

## 2021-05-08 NOTE — Unmapped (Signed)
Methotrexate 25 mg/ml injection solution     Last Visit Date: Visit date not found  Next Visit Date: Visit date not found    Lab Results   Component Value Date    ALT 25 05/08/2020    AST 28 05/08/2020    ALBUMIN 3.7 09/10/2019    CREATININE 0.72 02/06/2020     Lab Results   Component Value Date    WBC 6.6 05/08/2020    HGB 13.2 05/08/2020    HCT 39.4 05/08/2020    PLT 306 05/08/2020     Lab Results   Component Value Date    NEUTROPCT 61.9 09/10/2019    LYMPHOPCT 32 02/06/2020    MONOPCT 6 02/06/2020    EOSPCT 1 02/06/2020    BASOPCT 1 02/06/2020

## 2021-05-09 ENCOUNTER — Telehealth: Payer: Self-pay

## 2021-05-09 MED ORDER — METHOTREXATE SODIUM 25 MG/ML INJECTION SOLUTION
SUBCUTANEOUS | 0 refills | 28 days | Status: CP
Start: 2021-05-09 — End: ?

## 2021-05-09 NOTE — Unmapped (Signed)
Refilled x 1, overdue for labs (already ordered) and cancelled appointment for today. Will arrange f/u but needs labs before next refill. AEN, MD

## 2021-05-09 NOTE — Unmapped (Signed)
Sent WELL msg to call and schedule , unable to leave vc msg.

## 2021-05-09 NOTE — Unmapped (Signed)
8/25 sent WELL msg , unable toleave vc msg. Please schedule with Delorise Shiner, needs labs for further refills.

## 2021-05-09 NOTE — Telephone Encounter (Signed)
Called no answer no way to leave a message

## 2021-05-13 DIAGNOSIS — M7541 Impingement syndrome of right shoulder: Secondary | ICD-10-CM | POA: Diagnosis not present

## 2021-05-13 MED FILL — FLUTICASONE PROPIONATE 50 MCG/ACTUATION NASAL SPRAY,SUSPENSION: NASAL | 30 days supply | Qty: 16 | Fill #4

## 2021-05-13 MED FILL — BD TUBERCULIN SYRINGE 1 ML 25 GAUGE X 5/8": 28 days supply | Qty: 4 | Fill #2

## 2021-05-13 MED FILL — HUMIRA PEN CITRATE FREE 40 MG/0.4 ML: SUBCUTANEOUS | 28 days supply | Qty: 2 | Fill #7

## 2021-05-13 MED FILL — METHOTREXATE SODIUM 25 MG/ML INJECTION SOLUTION: SUBCUTANEOUS | 28 days supply | Qty: 4 | Fill #0

## 2021-05-24 DIAGNOSIS — M7541 Impingement syndrome of right shoulder: Secondary | ICD-10-CM | POA: Diagnosis not present

## 2021-05-24 DIAGNOSIS — M25511 Pain in right shoulder: Secondary | ICD-10-CM | POA: Diagnosis not present

## 2021-06-01 IMAGING — CT CT CHEST LUNG CANCER SCREENING LOW DOSE W/O CM
2 of 5 series · 15 of 40 positions shown, 18 images · non-contrast
Comparison: Low-dose lung cancer screening chest CT 06/09/2019.

CLINICAL DATA: 51-year-old female current smoker with 42 pack-year
history of smoking. Lung cancer screening examination.

EXAM:
CT CHEST WITHOUT CONTRAST LOW-DOSE FOR LUNG CANCER SCREENING
TECHNIQUE: Multidetector CT imaging of the chest was performed following the
standard protocol without IV contrast.

[Series 3: lung 1.00 · axial · 0.59mm/px · z∈[-1121,-865]mm · 12 of 282 slices shown, 15 images]
[im 13/282  mediastinal]
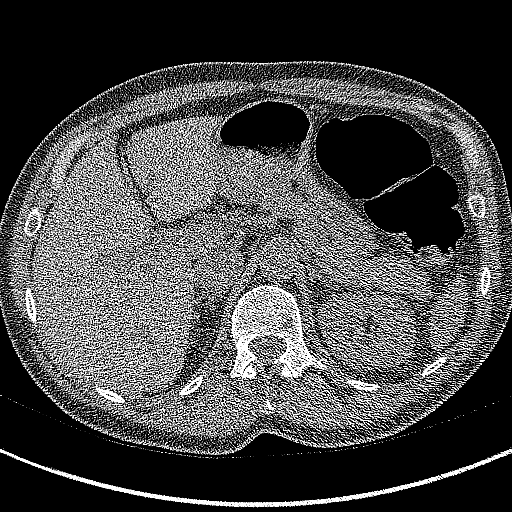
[im 13/282  lung]
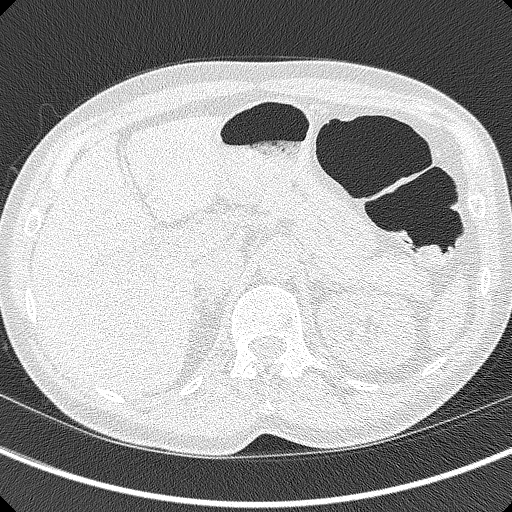
[im 39/282  lung]
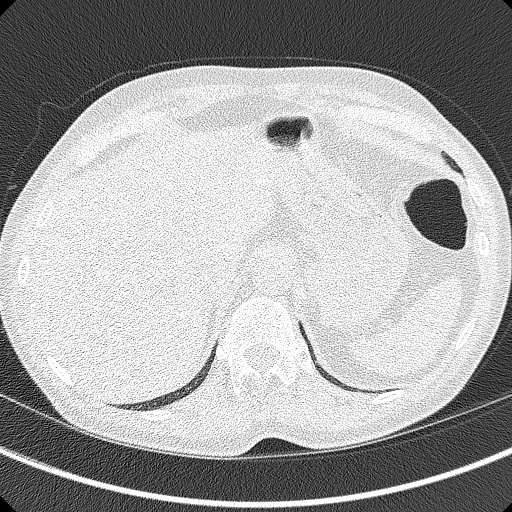
[im 64/282  lung]
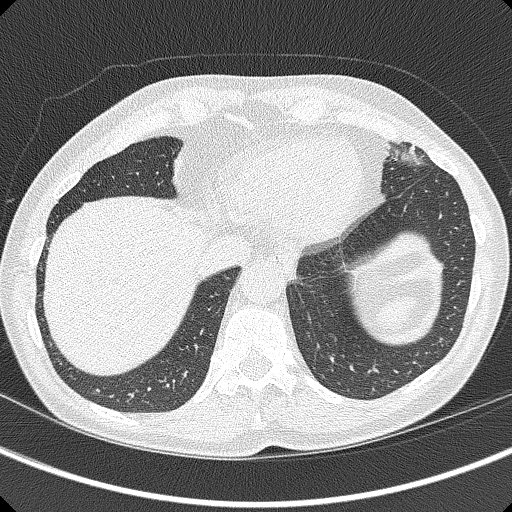
[im 90/282  lung]
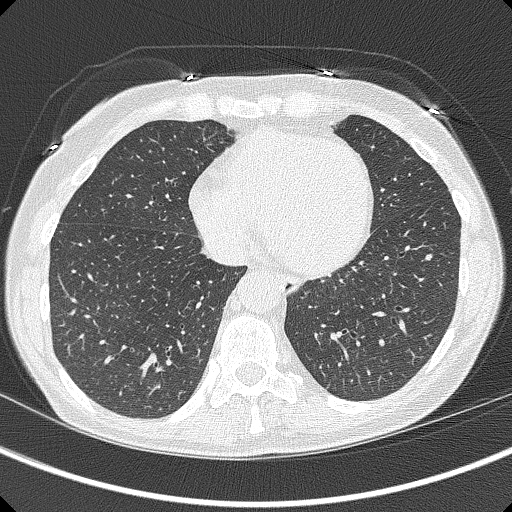
[im 103/282  mediastinal]
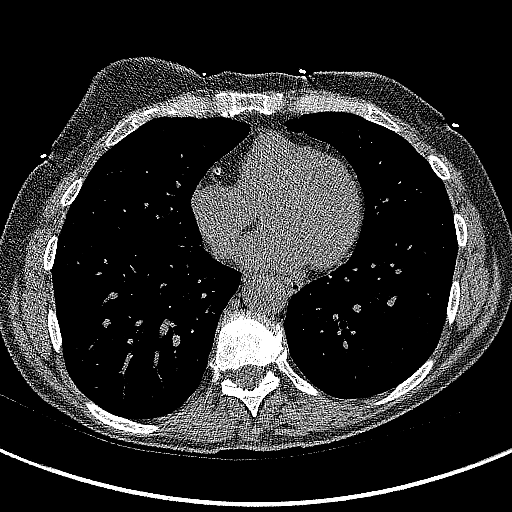
[im 103/282  lung]
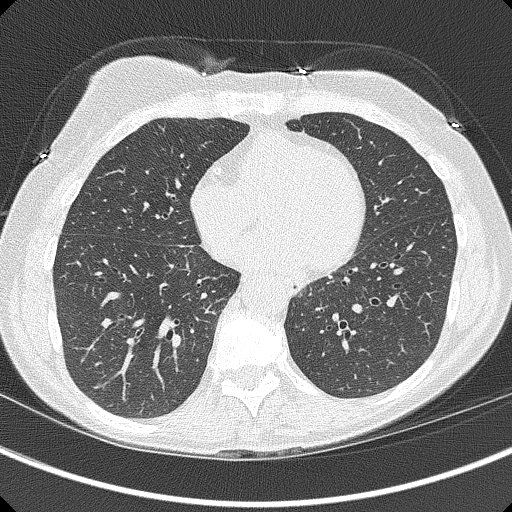
[im 128/282  lung]
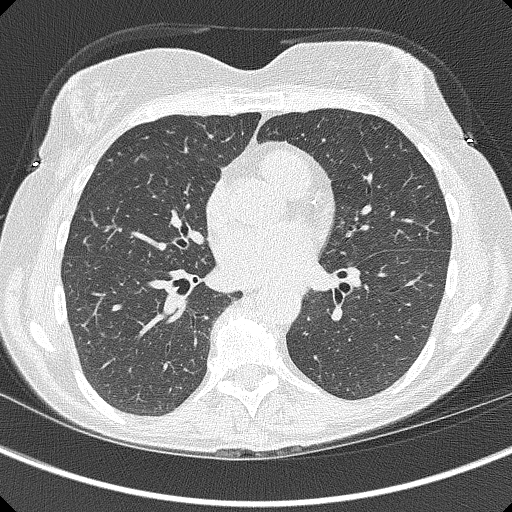
[im 154/282  lung]
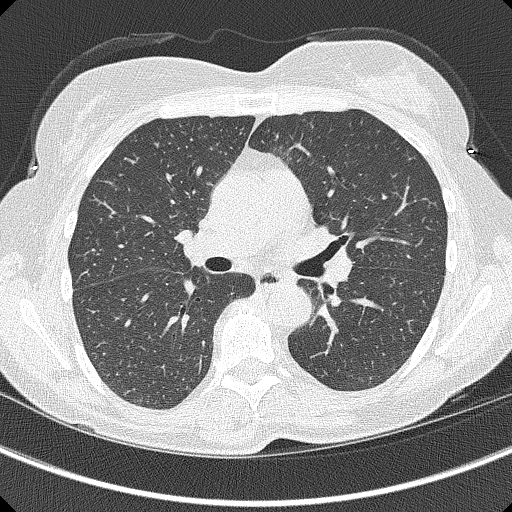
[im 179/282  lung]
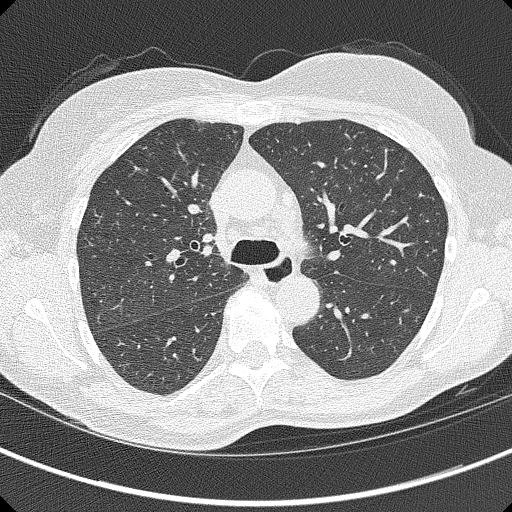
[im 192/282  mediastinal]
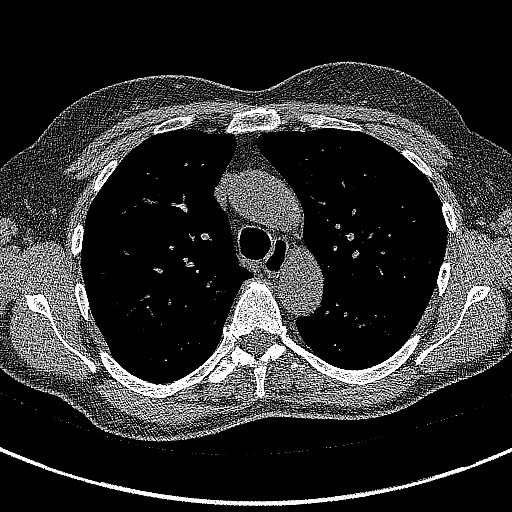
[im 192/282  lung]
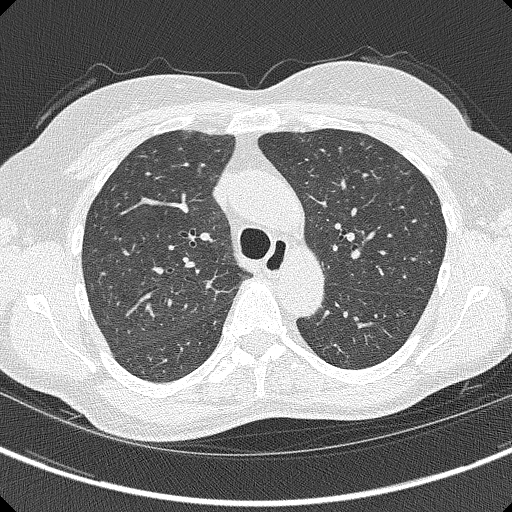
[im 218/282  lung]
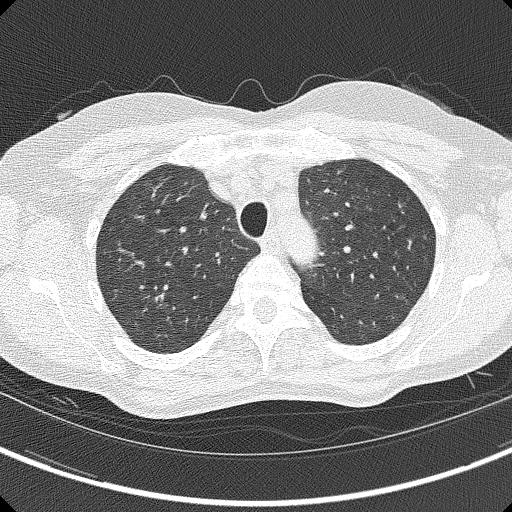
[im 243/282  lung]
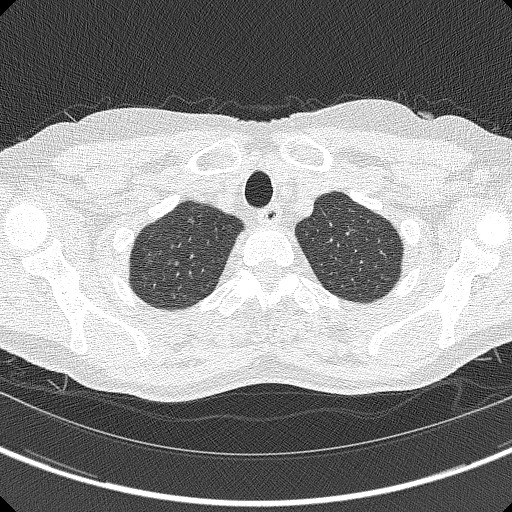
[im 269/282  lung]
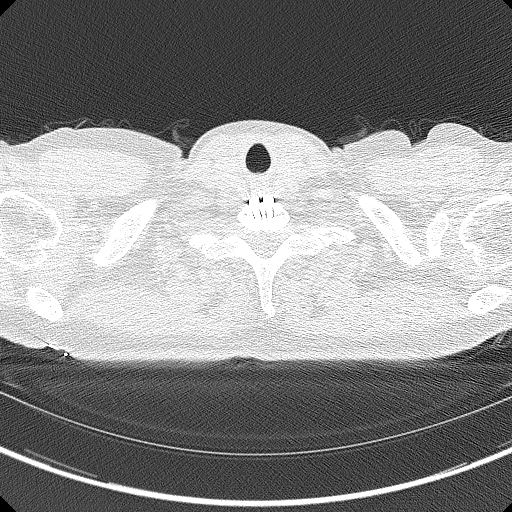

[Series 4: coronals lung 1.00 cor · coronal · 0.55mm/px · 3 of 248 slices shown]
[im 50/248  lung]
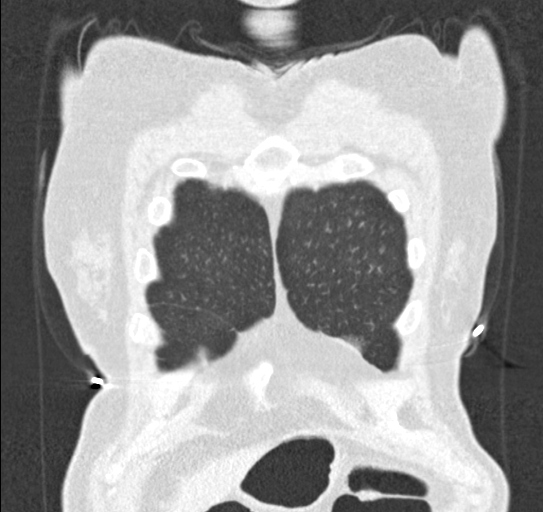
[im 99/248  lung]
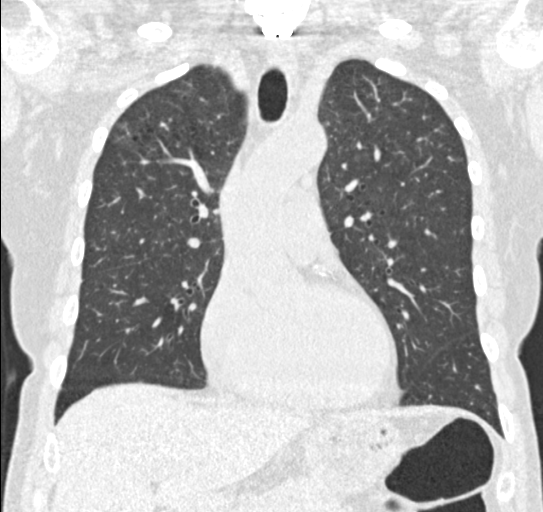
[im 149/248  lung]
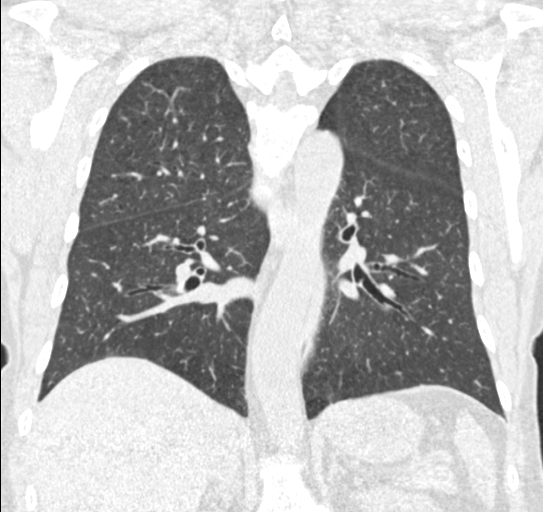

[15 of 40 positions shown; findings below may reference images not displayed]

FINDINGS: Cardiovascular: Heart size is normal. There is no significant
pericardial fluid, thickening or pericardial calcification. There is
aortic atherosclerosis, as well as atherosclerosis of the great
vessels of the mediastinum and the coronary arteries, including
calcified atherosclerotic plaque in the left anterior descending and
right coronary arteries.

Mediastinum/Nodes: No pathologically enlarged mediastinal or hilar
lymph nodes. Please note that accurate exclusion of hilar adenopathy
is limited on noncontrast CT scans. Esophagus is unremarkable in
appearance. No axillary lymphadenopathy.

Lungs/Pleura: Multiple small pulmonary nodules are again noted in
the lungs bilaterally, similar in size and number to the prior
examination, largest of which is in the periphery of the left lower
lobe (axial image 203 of series 3), with a volume derived mean
diameter 4.9 mm. No other larger more suspicious appearing pulmonary
nodules or masses are noted. No acute consolidative airspace
disease. No pleural effusions. Diffuse bronchial wall thickening
with mild centrilobular and paraseptal emphysema.

Upper Abdomen: Unremarkable.

Musculoskeletal: Orthopedic fixation hardware in the lower cervical
spine incidentally noted. there are no aggressive appearing lytic or
blastic lesions noted in the visualized portions of the skeleton.
IMPRESSION: 1. Lung-RADS 2S, benign appearance or behavior. Continue annual
screening with low-dose chest CT without contrast in 12 months.
2. The "S" modifier above refers to potentially clinically
significant non lung cancer related findings. Specifically, there is
aortic atherosclerosis, in addition to 2 vessel coronary artery
disease. Please note that although the presence of coronary artery
calcium documents the presence of coronary artery disease, the
severity of this disease and any potential stenosis cannot be
assessed on this non-gated CT examination. Assessment for potential
risk factor modification, dietary therapy or pharmacologic therapy
may be warranted, if clinically indicated.
3. Mild diffuse bronchial wall thickening with mild centrilobular
and paraseptal emphysema; imaging findings suggestive of underlying
COPD.

Aortic Atherosclerosis (QN97Q-TG4.4) and Emphysema (QN97Q-85M.J).

## 2021-06-07 NOTE — Unmapped (Signed)
The [facility] has made a second and final attempt to reach this patient to refill the following medication:Humira, Methotrexate.      We have been unable to leave messages on the following phone numbers: [phone] and have sent a MyChart message.    Dates contacted: 9/16, 9/23  Last scheduled delivery: 8/29    The patient may be at risk of non-compliance with this medication. The patient should call the [facility] at [phone]  Option 4, then Option 2 (all other specialty patients) to refill medication.    [name]   [facility] Specialty Technician

## 2021-06-18 DIAGNOSIS — J3089 Other allergic rhinitis: Principal | ICD-10-CM

## 2021-06-18 DIAGNOSIS — Z79631 Methotrexate, long term, current use: Principal | ICD-10-CM

## 2021-06-18 DIAGNOSIS — L4050 Arthropathic psoriasis, unspecified: Principal | ICD-10-CM

## 2021-06-18 DIAGNOSIS — L409 Psoriasis, unspecified: Principal | ICD-10-CM

## 2021-06-18 MED ORDER — ACITRETIN 25 MG CAPSULE
ORAL_CAPSULE | ORAL | 0 refills | 0.00000 days | Status: CP
Start: 2021-06-18 — End: ?

## 2021-06-18 MED ORDER — METHOTREXATE SODIUM 25 MG/ML INJECTION SOLUTION
SUBCUTANEOUS | 0 refills | 28 days
Start: 2021-06-18 — End: ?

## 2021-06-18 MED ORDER — FLUTICASONE PROPIONATE 50 MCG/ACTUATION NASAL SPRAY,SUSPENSION
Freq: Every day | NASAL | 4 refills | 30 days
Start: 2021-06-18 — End: 2022-06-18

## 2021-06-18 NOTE — Unmapped (Signed)
We received a request for Humira Pen from pharmacy for review and sign  LOV 07/19/2020

## 2021-06-18 NOTE — Unmapped (Signed)
[facility] Refill Coordination Note    Specialty Medication(s) to be Shipped:   Inflammatory Disorders: Humira and methotrexate (injectable)    Other medication(s) to be shipped: Syringes and Flonase     [name], DOB: 03/17/1961  Phone: [phone] (home)       All above HIPAA information was verified with patient.     Was a Nurse, learning disability used for this call? No    Completed refill call assessment today to schedule patient's medication shipment from the [facility] [phone]).  All relevant notes have been reviewed.     Specialty medication(s) and dose(s) confirmed: Regimen is correct and unchanged.   Changes to medications: [name] reports no changes at this time.  Changes to insurance: No  New side effects reported not previously addressed with a pharmacist or physician: None reported  Questions for the pharmacist: No    Confirmed patient received a Conservation officer, historic buildings and a Surveyor, mining with first shipment. The patient will receive a drug information handout for each medication shipped and additional FDA Medication Guides as required.       DISEASE/MEDICATION-SPECIFIC INFORMATION        For patients on injectable medications: Patient currently has 1 doses left.  Next injection is scheduled for Humira-10/9 Methotrexate-10/9.    SPECIALTY MEDICATION ADHERENCE     Medication Adherence    Patient reported X missed doses in the last month: 0  Specialty Medication: Humira 40mg /0.59mL  Patient is on additional specialty medications: Yes  Additional Specialty Medications: Methotrexate 25mg /mL  Patient Reported Additional Medication X Missed Doses in the Last Month: 0  Patient is on more than two specialty medications: No  Informant: patient              Were doses missed due to medication being on hold? No    Methotrexate 25mg /mL : 1 days of medicine on hand   Humira 40mg /0.54mL : 1 days of medicine on hand       REFERRAL TO PHARMACIST     Referral to the pharmacist: Not needed      [facility]     Shipping address confirmed in Epic.     Delivery Scheduled: Yes, Expected medication delivery date: 06/27/21.  However, Rx request for refills was sent to the provider as there are none remaining.     Medication will be delivered via Same Day Courier to the prescription address in Epic [place].    Wyatt Mage M Elisabeth Cara   [facility] Specialty Technician

## 2021-06-18 NOTE — Unmapped (Signed)
[facility] - PHARMACIST NOTES    Michele Miller is currently filling methotrexate and Humira through the [facility]. The [facility] have made multiple unsuccessful attempts to refill her medication(s) the past few weeks.  Last shipment went out on 8/29 for a 1 month supply.      Attempt to reach patient from clinic for follow up today. Unable to get in touch with patient, number not in service - unable to leave voicemail.    Dr. Delton See refilled methotrexate 05/09/21 but for only 1 fill, needing patient to complete labs as well as return to clinic for visit.  Last appt was a telemedicine visit on 01/04/21.     Chelsea Aus

## 2021-06-19 DIAGNOSIS — L409 Psoriasis, unspecified: Principal | ICD-10-CM

## 2021-06-19 MED ORDER — METHOTREXATE SODIUM 25 MG/ML INJECTION SOLUTION
SUBCUTANEOUS | 0 refills | 28 days
Start: 2021-06-19 — End: ?

## 2021-06-19 MED ORDER — ACITRETIN 25 MG CAPSULE
ORAL_CAPSULE | 0 refills | 0.00000 days
Start: 2021-06-19 — End: ?

## 2021-06-19 NOTE — Unmapped (Signed)
Rx request for Acitretin 25mg  capsule  Last OV:08/27/2020  Rx pending for your review and approval

## 2021-06-19 NOTE — Unmapped (Signed)
Labs required for refills

## 2021-06-24 MED ORDER — METHOTREXATE SODIUM 25 MG/ML INJECTION SOLUTION
SUBCUTANEOUS | 0 refills | 28 days | Status: CP
Start: 2021-06-24 — End: ?

## 2021-06-24 NOTE — Unmapped (Signed)
Addended by: Lynann Beaver on: 06/24/2021 08:40 AM     Modules accepted: Orders

## 2021-06-27 MED FILL — METHOTREXATE SODIUM 25 MG/ML INJECTION SOLUTION: SUBCUTANEOUS | 28 days supply | Qty: 4 | Fill #0

## 2021-06-27 MED FILL — HUMIRA PEN CITRATE FREE 40 MG/0.4 ML: SUBCUTANEOUS | 28 days supply | Qty: 2 | Fill #8

## 2021-06-27 MED FILL — BD TUBERCULIN SYRINGE 1 ML 25 GAUGE X 5/8": 28 days supply | Qty: 4 | Fill #3

## 2021-07-09 NOTE — Unmapped (Unsigned)
Dermatology Note     Assessment and Plan:      Hand dermatitis in patient with hx of PsA- improved on MTX (25mg  weekly), acitretin (25mg  daily), Humira- much improved  - Rheumatology prescribing methotrexate at 25 mg weekly for psoriatic arthritis; no prior history of cutaneous psoriasis  - DDx includes psoriasis (favored) versus allergic contact dermatitis vs other  - s/p several short courses of prednisone in the past  - Failed Otezla (GI side effects and headaches)  - Discuss again option of weaning down off of acitretin, will go down to 12.5 mg a day  - Currently using clobetasol ointment as needed  - acitretin (SORIATANE) 25 MG capsule; Take 1/2 tablet once daily  Dispense: 90 capsule; Refill: 1  - HUMIRA PEN CITRATE FREE 40 MG/0.4 ML; Inject the contents of 1 pen (40mg ) under the skin every 14 days  Dispense: 6 each; Refill: 3    High risk medication use: MTX, Adalimumab, Acitretin  - Quant Gold 5/21  - Labs 05/08/20 WNL; normal labs 07/2020    History of nonmelanoma skin cancer  ***      The patient was advised to call for an appointment should any new, changing, or symptomatic lesions develop.     RTC: No follow-ups on file. or sooner as needed   _________________________________________________________________      Chief Complaint     No chief complaint on file.      HPI     Michele Miller is a 60 y.o. female who presents as a returning patient (last seen by Dr. Caralyn Guile on 08/27/2020) to [facility] for follow up of rash. At last visit, patient was to decrease acitretin to 12.5 mg a day and to continue Humira injections every 14 days for hand dermatitis.     ***    The patient denies any other new or changing lesions or areas of concern.     Pertinent Past Medical History     History of skin cancer as outlined below:    Problem List        Other    History of nonmelanoma skin cancer - Primary     Skin Cancer History- Non-Melanoma Skin Cancer    Diagnosis Location Biopsy Date Treatment date Procedure Surgeon   [facility] RUB 06/2020 07/2020 [facility]                                  Family History:   Unknown     Past Medical History, Family History, Social History, Medication List, Allergies, and Problem List were reviewed in the rooming section of Epic.     ROS: Other than symptoms mentioned in the HPI, no fevers, chills, or other skin complaints    Physical Examination     GENERAL: Well-appearing female in no acute distress, resting comfortably.  NEURO: Alert and oriented, answers questions appropriately  PSYCH: Normal mood and affect  {PE extent:75514}  {PE list:75421}  ***    All areas not commented on are within normal limits or unremarkable    Scribe's Attestation: Mckinley Jewel, MD obtained and performed the history, physical exam and medical decision making elements that were entered into the chart. Signed by Hazle Nordmann, Scribe, on ***.    {*** NOTE TO PROVIDER: PLEASE ADD ATTESTATION NOTING YOU AGREE WITH SCRIBE DOCUMENTATION}     (Approved Template 05/28/2020)

## 2021-07-18 DIAGNOSIS — Z79631 Methotrexate, long term, current use: Principal | ICD-10-CM

## 2021-07-18 DIAGNOSIS — L4050 Arthropathic psoriasis, unspecified: Principal | ICD-10-CM

## 2021-07-18 MED ORDER — METHOTREXATE SODIUM 25 MG/ML INJECTION SOLUTION
SUBCUTANEOUS | 0 refills | 28 days | Status: CP
Start: 2021-07-18 — End: ?

## 2021-07-18 NOTE — Unmapped (Signed)
Methotrexate refill  Last Visit Date: Visit date not found  Next Visit Date: 08/13/2021    Lab Results   Component Value Date    ALT 25 05/08/2020    AST 28 05/08/2020    ALBUMIN 3.7 09/10/2019    CREATININE 0.72 02/06/2020     Lab Results   Component Value Date    WBC 6.6 05/08/2020    HGB 13.2 05/08/2020    HCT 39.4 05/08/2020    PLT 306 05/08/2020     Lab Results   Component Value Date    NEUTROPCT 61.9 09/10/2019    LYMPHOPCT 32 02/06/2020    MONOPCT 6 02/06/2020    EOSPCT 1 02/06/2020    BASOPCT 1 02/06/2020

## 2021-07-18 NOTE — Unmapped (Signed)
[facility] Refill Coordination Note    Specialty Medication(s) to be Shipped:   Inflammatory Disorders: Humira and methotrexate (injectable)    Other medication(s) to be shipped: syringes     Michele Miller, DOB: 05-03-1961  Phone: [phone] (home)       All above HIPAA information was verified with patient.     Was a Nurse, learning disability used for this call? No    Completed refill call assessment today to schedule patient's medication shipment from the [facility] [phone]).  All relevant notes have been reviewed.     Specialty medication(s) and dose(s) confirmed: Regimen is correct and unchanged.   Changes to medications: Michele Miller reports no changes at this time.  Changes to insurance: No  New side effects reported not previously addressed with a pharmacist or physician: None reported  Questions for the pharmacist: No    Confirmed patient received a Conservation officer, historic buildings and a Surveyor, mining with first shipment. The patient will receive a drug information handout for each medication shipped and additional FDA Medication Guides as required.       DISEASE/MEDICATION-SPECIFIC INFORMATION        For patients on injectable medications: Patient currently has 1 doses left.  Next injection is scheduled for 11/4.    SPECIALTY MEDICATION ADHERENCE     Medication Adherence    Patient reported X missed doses in the last month: 0  Specialty Medication: Humira  Patient is on additional specialty medications: Yes  Additional Specialty Medications: Methotrexate  Patient Reported Additional Medication X Missed Doses in the Last Month: 0  Patient is on more than two specialty medications: No  Any gaps in refill history greater than 2 weeks in the last 3 months: no  Demonstrates understanding of importance of adherence: yes  Informant: patient              Were doses missed due to medication being on hold? No    Humira 40mg /0.30ml: Patient has 14 days of medication on hand  Methotrexate 25mg /ml: Patient has 7 days of medication on hand    REFERRAL TO PHARMACIST     Referral to the pharmacist: Not needed      [id number]     Shipping address confirmed in Epic.     Delivery Scheduled: Yes, Expected medication delivery date: 11/10.  However, Rx request for refills was sent to the provider as there are none remaining.     Medication will be delivered via Same Day Courier to the prescription address in Epic [place].    Michele Miller   [facility] Specialty Technician

## 2021-07-18 NOTE — Unmapped (Signed)
Updated labs Aug 2022 reviewed in Care Everywhere. Follow up with Delorise Shiner scheduled this month.  AEN, MD

## 2021-07-19 DIAGNOSIS — Z79899 Other long term (current) drug therapy: Secondary | ICD-10-CM | POA: Diagnosis not present

## 2021-07-22 ENCOUNTER — Encounter: Payer: Self-pay | Admitting: Nurse Practitioner

## 2021-07-22 ENCOUNTER — Other Ambulatory Visit: Payer: Self-pay

## 2021-07-22 ENCOUNTER — Ambulatory Visit (INDEPENDENT_AMBULATORY_CARE_PROVIDER_SITE_OTHER): Payer: Medicare Other | Admitting: Nurse Practitioner

## 2021-07-22 VITALS — BP 103/75 | HR 83 | Temp 98.5°F | Resp 20 | Wt 132.4 lb

## 2021-07-22 DIAGNOSIS — J440 Chronic obstructive pulmonary disease with acute lower respiratory infection: Secondary | ICD-10-CM

## 2021-07-22 DIAGNOSIS — H6690 Otitis media, unspecified, unspecified ear: Secondary | ICD-10-CM | POA: Diagnosis not present

## 2021-07-22 DIAGNOSIS — J209 Acute bronchitis, unspecified: Secondary | ICD-10-CM

## 2021-07-22 MED ORDER — HYDROCOD POLST-CPM POLST ER 10-8 MG/5ML PO SUER: 5.0000 mL | Freq: Two times a day (BID) | ORAL | 0 refills | Status: DC | PRN

## 2021-07-22 MED ORDER — CEFUROXIME AXETIL 250 MG PO TABS: 250.0000 mg | ORAL_TABLET | Freq: Two times a day (BID) | ORAL | 0 refills | Status: AC

## 2021-07-22 NOTE — Patient Instructions (Signed)
Don't take the cough syrup with your klonopin

## 2021-07-22 NOTE — Progress Notes (Signed)
Acute Office Visit  Subjective:    Patient ID: Emily Johnston, female    DOB: 1960/09/17, 60 y.o.   MRN: [id number]  Chief Complaint  Patient presents with   Cough   Ear Pain    HPI Patient is in today for cough for 3 weeks and ear pain since yesterday. Her symptoms are worsening today.   UPPER RESPIRATORY TRACT INFECTION  Worst symptom: cough Fever: no Cough: yes Shortness of breath: no Wheezing: yes Chest pain: no Chest tightness: yes Chest congestion: no Nasal congestion: yes Runny nose: no Post nasal drip: yes Sneezing: no Sore throat:  slightly irritated Swollen glands: no Sinus pressure: no Headache: yes Face pain: no Toothache: no Ear pain: yes bilateral Ear pressure: no  Eyes red/itching:no Eye drainage/crusting: no  Vomiting: no Rash: no Fatigue: yes Sick contacts: no Strep contacts: no  Context: worse Recurrent sinusitis: no Relief with OTC cold/cough medications: yes  Treatments attempted: theraflu, sinus flu/cold    Past Medical History:  Diagnosis Date   Anxiety    Arthritis    COPD (chronic obstructive pulmonary disease) (HCC)    Depression    Fibromyalgia    IBS (irritable bowel syndrome)    PTSD (post-traumatic stress disorder)     Past Surgical History:  Procedure Laterality Date   ABDOMINAL HYSTERECTOMY     CARPAL TUNNEL RELEASE     KNEE SURGERY      History reviewed. No pertinent family history.  Social History   Socioeconomic History   Marital status: Legally Separated    Spouse name: Not on file   Number of children: Not on file   Years of education: Not on file   Highest education level: Not on file  Occupational History   Not on file  Tobacco Use   Smoking status: Every Day    Packs/day: 1.00    Years: 41.00    Pack years: 41.00    Types: Cigarettes   Smokeless tobacco: Never  Vaping Use   Vaping Use: Never used  Substance and Sexual Activity   Alcohol use: No   Drug use: No   Sexual activity:  Not on file  Other Topics Concern   Not on file  Social History Narrative   Not on file   Social Determinants of Health   Financial Resource Strain: Not on file  Food Insecurity: Not on file  Transportation Needs: Not on file  Physical Activity: Not on file  Stress: Not on file  Social Connections: Not on file  Intimate Partner Violence: Not on file    Outpatient Medications Prior to Visit  Medication Sig Dispense Refill   acitretin (SORIATANE) 25 MG capsule Take 1 capsule by mouth daily.     Adalimumab (HUMIRA PEN) 40 MG/0.4ML PNKT Inject into the skin.     albuterol (VENTOLIN HFA) 108 (90 Base) MCG/ACT inhaler Inhale 2 puffs into the lungs every 6 (six) hours as needed for wheezing or shortness of breath. 1 each 3   clonazePAM (KLONOPIN) 1 MG tablet Take 1 mg by mouth as needed for anxiety. 3 times daily as needed     DULoxetine (CYMBALTA) 60 MG capsule Take 2 capsules by mouth daily.     fluticasone (FLONASE) 50 MCG/ACT nasal spray Place 2 sprays into both nostrils daily. 16 g 2   folic acid (FOLVITE) 1 MG tablet Take 1 mg by mouth daily.     gabapentin (NEURONTIN) 300 MG capsule Take 300 mg by mouth 3 (three) times  daily.     loratadine (CLARITIN) 10 MG tablet Take 1 tablet (10 mg total) by mouth daily. 90 tablet 3   meclizine (ANTIVERT) 12.5 MG tablet Take 1 tablet (12.5 mg total) by mouth 3 (three) times daily as needed for dizziness. 60 tablet 1   methotrexate 250 MG/10ML injection Inject 25 mg into the vein once a week.     Multiple Vitamin (MULTIVITAMIN) tablet Take 1 tablet by mouth daily.     OLANZapine (ZYPREXA) 5 MG tablet Take 2.5 mg by mouth 2 (two) times daily.     senna (SENOKOT) 8.6 MG tablet Take 1 tablet by mouth as needed.     STIOLTO RESPIMAT 2.5-2.5 MCG/ACT AERS INHALE 2 PUFFS BY MOUTH INTO THE LUNGS ONCE DAILY 16 g 4   tiZANidine (ZANAFLEX) 4 MG tablet Take 1 tablet (4 mg total) by mouth every 6 (six) hours as needed for muscle spasms. 30 tablet 0    traZODone (DESYREL) 50 MG tablet Take 2 tablets by mouth daily. (Patient not taking: Reported on 07/22/2021)     VYVANSE 30 MG capsule Take 30 mg by mouth at bedtime. (Patient not taking: Reported on 07/22/2021)     No facility-administered medications prior to visit.    No Known Allergies  Review of Systems  Constitutional:  Positive for fatigue. Negative for fever.  HENT:  Positive for congestion, ear pain, postnasal drip, rhinorrhea and sore throat. Negative for sinus pressure and sinus pain.   Eyes: Negative.   Respiratory:  Positive for cough and chest tightness. Negative for shortness of breath.   Cardiovascular: Negative.   Gastrointestinal: Negative.   Genitourinary: Negative.   Skin: Negative.   Neurological:  Positive for headaches. Negative for dizziness.      Objective:    Physical Exam Vitals and nursing note reviewed.  Constitutional:      General: She is not in acute distress.    Appearance: Normal appearance.  HENT:     Head: Normocephalic.     Right Ear: Ear canal and external ear normal. A middle ear effusion is present. Tympanic membrane is erythematous.     Left Ear: Tympanic membrane, ear canal and external ear normal.  Eyes:     Conjunctiva/sclera: Conjunctivae normal.  Cardiovascular:     Rate and Rhythm: Normal rate and regular rhythm.     Pulses: Normal pulses.     Heart sounds: Normal heart sounds.  Pulmonary:     Effort: Pulmonary effort is normal.     Breath sounds: Normal breath sounds.  Abdominal:     Palpations: Abdomen is soft.     Tenderness: There is no abdominal tenderness.  Musculoskeletal:     Cervical back: Normal range of motion.  Skin:    General: Skin is warm.  Neurological:     General: No focal deficit present.     Mental Status: She is alert and oriented to person, place, and time.  Psychiatric:        Mood and Affect: Mood normal.        Behavior: Behavior normal.        Thought Content: Thought content normal.         Judgment: Judgment normal.    BP 103/75 (BP Location: Left Arm, Patient Position: Sitting)   Pulse 83   Temp 98.5 F (36.9 C) (Oral)   Resp 20   Wt 132 lb 6.4 oz (60.1 kg)   SpO2 96%   BMI 23.26 kg/m  Wt Readings from  Last 3 Encounters:  07/22/21 132 lb 6.4 oz (60.1 kg)  04/30/21 126 lb 8 oz (57.4 kg)  10/29/20 130 lb 3.2 oz (59.1 kg)    Health Maintenance Due  Topic Date Due   COVID-19 Vaccine (2 - Pfizer risk series) 10/31/2020   MAMMOGRAM  03/09/2021   INFLUENZA VACCINE  04/15/2021    There are no preventive care reminders to display for this patient.   Lab Results  Component Value Date   TSH 0.674 10/29/2020   Lab Results  Component Value Date   WBC 8.3 04/30/2021   HGB 13.9 04/30/2021   HCT 42.4 04/30/2021   MCV 100 (H) 04/30/2021   PLT 297 04/30/2021   Lab Results  Component Value Date   NA 142 04/30/2021   K 4.2 04/30/2021   CO2 21 04/30/2021   GLUCOSE 72 04/30/2021   BUN 15 04/30/2021   CREATININE 0.90 04/30/2021   BILITOT <0.2 04/30/2021   ALKPHOS 91 04/30/2021   AST 27 04/30/2021   ALT 18 04/30/2021   PROT 6.4 04/30/2021   ALBUMIN 4.3 04/30/2021   CALCIUM 9.1 04/30/2021   ANIONGAP 11 01/11/2018   EGFR 74 04/30/2021   Lab Results  Component Value Date   CHOL 217 (H) 04/30/2021   Lab Results  Component Value Date   HDL 54 04/30/2021   Lab Results  Component Value Date   LDLCALC 143 (H) 04/30/2021   Lab Results  Component Value Date   TRIG 111 04/30/2021   No results found for: CHOLHDL No results found for: HGBA1C     Assessment & Plan:   Problem List Items Addressed This Visit   None Visit Diagnoses     Acute bronchitis with COPD ([place])    -  Primary   Cough x3 weeks. Will treat with ceftin and tussionex prn. PDMP reviewed. Don't take with klonopin. F/U if symptoms worsen or concerns.    Relevant Medications   chlorpheniramine-HYDROcodone (TUSSIONEX PENNKINETIC ER) 10-8 MG/5ML SUER   Acute otitis media, unspecified otitis  media type       Right TM erythematous. Will treat with ceftin. Can take ibuprofen/tylenol for pain. F/U for worsening symptoms or concerns   Relevant Medications   cefUROXime (CEFTIN) 250 MG tablet        Meds ordered this encounter  Medications   chlorpheniramine-HYDROcodone (TUSSIONEX PENNKINETIC ER) 10-8 MG/5ML SUER    Sig: Take 5 mLs by mouth every 12 (twelve) hours as needed for cough. Do not take with klonopin    Dispense:  50 mL    Refill:  0   cefUROXime (CEFTIN) 250 MG tablet    Sig: Take 1 tablet (250 mg total) by mouth 2 (two) times daily with a meal for 7 days.    Dispense:  14 tablet    Refill:  0     Charyl Dancer, NP

## 2021-07-25 MED FILL — BD TUBERCULIN SYRINGE 1 ML 27 X 1/2": 28 days supply | Qty: 4 | Fill #0

## 2021-07-25 MED FILL — METHOTREXATE SODIUM 25 MG/ML INJECTION SOLUTION: SUBCUTANEOUS | 28 days supply | Qty: 4 | Fill #0

## 2021-07-25 MED FILL — HUMIRA PEN CITRATE FREE 40 MG/0.4 ML: SUBCUTANEOUS | 28 days supply | Qty: 2 | Fill #9

## 2021-07-26 ENCOUNTER — Ambulatory Visit: Payer: Self-pay | Admitting: *Deleted

## 2021-07-26 NOTE — Telephone Encounter (Signed)
Summary: Symptoms, possible med reaction   Pt states that she feels worse than she did before coming to the doctor. She says that amoxacillin works best for her, her cough and congestion have both gotten worse since she starting taking what she was prescribed on Monday the 7th.   Best contact: [phone]     Attempted to call patient- no answer.Message: Wireless caller is not available try call later.

## 2021-07-26 NOTE — Telephone Encounter (Signed)
Second attempt to contact patient- call can not be completed at this time message

## 2021-07-26 NOTE — Telephone Encounter (Signed)
C/o worsening symptoms . C/o worsening cough productive clear to brownish colored sputum. Congestion, ears feel like fluid in them , fatigue and "doesn't want to get up and do anything.". rreports medication is not helping. Still having sx since seen from 07/22/21. Reports insurance will not pay for cough syrup prescribed. Rightness reported in chest. Shortness of breath denies difficulty breathing and fever. Hx COPD requesting different medication. Please call back if new medication called in to pharmacy. Recommended UC or ED and patient would like to notify PCP . Care advise given. Patient verbalized understanding of care advise and to call back or go to [facility] or ED if symptoms worsen. Attempted to contact [facility] of patient declining to go to [facility] or ED. No answer.

## 2021-07-26 NOTE — Telephone Encounter (Signed)
Routing to provider who saw the patient.  

## 2021-07-26 NOTE — Telephone Encounter (Signed)
Third attempt to contact patient- wireless customer is not available- try call again later.

## 2021-07-26 NOTE — Telephone Encounter (Signed)
Reason for Disposition  [1] Known COPD or other severe lung disease (i.e., bronchiectasis, cystic fibrosis, lung surgery) AND [2] worsening symptoms (i.e., increased sputum purulence or amount, increased breathing difficulty  Answer Assessment - Initial Assessment Questions 1. ONSET: "When did the cough begin?"      Prior to 07/22/21 2. SEVERITY: "How bad is the cough today?"      Worse, coughing up brown mucus  3. SPUTUM: "Describe the color of your sputum" (none, dry cough; clear, white, yellow, green)     Clear to brown mucus  4. HEMOPTYSIS: "Are you coughing up any blood?" If so ask: "How much?" (flecks, streaks, tablespoons, etc.)     No  5. DIFFICULTY BREATHING: "Are you having difficulty breathing?" If Yes, ask: "How bad is it?" (e.g., mild, moderate, severe)    - MILD: No SOB at rest, mild SOB with walking, speaks normally in sentences, can lie down, no retractions, pulse < 100.    - MODERATE: SOB at rest, SOB with minimal exertion and prefers to sit, cannot lie down flat, speaks in phrases, mild retractions, audible wheezing, pulse 100-120.    - SEVERE: Very SOB at rest, speaks in single words, struggling to breathe, sitting hunched forward, retractions, pulse > 120      Mild  6. FEVER: "Do you have a fever?" If Yes, ask: "What is your temperature, how was it measured, and when did it start?"     No  7. CARDIAC HISTORY: "Do you have any history of heart disease?" (e.g., heart attack, congestive heart failure)      na 8. LUNG HISTORY: "Do you have any history of lung disease?"  (e.g., pulmonary embolus, asthma, emphysema)     COPD 9. PE RISK FACTORS: "Do you have a history of blood clots?" (or: recent major surgery, recent prolonged travel, bedridden)     na 10. OTHER SYMPTOMS: "Do you have any other symptoms?" (e.g., runny nose, wheezing, chest pain)       Ears full of fluid, cough, congestion  11. PREGNANCY: "Is there any chance you are pregnant?" "When was your last menstrual  period?"       na 12. TRAVEL: "Have you traveled out of the country in the last month?" (e.g., travel history, exposures)       na  Protocols used: Cough - Acute Productive-A-AH

## 2021-07-30 ENCOUNTER — Ambulatory Visit (INDEPENDENT_AMBULATORY_CARE_PROVIDER_SITE_OTHER): Payer: Medicare Other | Admitting: Nurse Practitioner

## 2021-07-30 ENCOUNTER — Encounter: Payer: Self-pay | Admitting: Nurse Practitioner

## 2021-07-30 ENCOUNTER — Other Ambulatory Visit: Payer: Self-pay

## 2021-07-30 VITALS — BP 128/89 | HR 88 | Temp 97.9°F | Wt 136.4 lb

## 2021-07-30 DIAGNOSIS — J440 Chronic obstructive pulmonary disease with acute lower respiratory infection: Secondary | ICD-10-CM | POA: Diagnosis not present

## 2021-07-30 DIAGNOSIS — R051 Acute cough: Secondary | ICD-10-CM | POA: Diagnosis not present

## 2021-07-30 DIAGNOSIS — J209 Acute bronchitis, unspecified: Secondary | ICD-10-CM

## 2021-07-30 MED ORDER — LEVOFLOXACIN 500 MG PO TABS: 500.0000 mg | ORAL_TABLET | Freq: Every day | ORAL | 0 refills | Status: DC

## 2021-07-30 MED ORDER — PROMETHAZINE-DM 6.25-15 MG/5ML PO SYRP: 5.0000 mL | ORAL_SOLUTION | Freq: Four times a day (QID) | ORAL | 0 refills | Status: DC | PRN

## 2021-07-30 MED ORDER — PREDNISONE 10 MG PO TABS: ORAL_TABLET | ORAL | 0 refills | Status: DC

## 2021-07-30 NOTE — Telephone Encounter (Signed)
She needs an appointment today

## 2021-07-30 NOTE — Telephone Encounter (Signed)
Pt is scheduled °

## 2021-07-30 NOTE — Patient Instructions (Addendum)
Delsym for cough as needed from any pharmacy Will start levaquin daily Get a chest x-ray at [facility]. You can walk in with no appointment needed.

## 2021-07-30 NOTE — Progress Notes (Addendum)
Acute Office Visit  Subjective:    Patient ID: Emily Johnston, female    DOB: [birth year], 60 y.o.   MRN: [id number]  Chief Complaint  Patient presents with   Cough    Productive cough with brown flem. Fatigue. Also has ear pain.    Nasal Congestion    HPI Patient is in today for ongoing productive cough with brown phlegm. Was seen on 07/22/21 and diagnosed with right otitis media and treated with ceftin. She had a medication interaction with doxycyline and augmentin.    UPPER RESPIRATORY TRACT INFECTION  Worst symptom: cough Fever: no Cough: yes Shortness of breath: yes Wheezing: no Chest pain: no Chest tightness: no Chest congestion: no Nasal congestion: yes Runny nose: no Post nasal drip: no Sneezing: no Sore throat: no Swollen glands: no Sinus pressure: yes Headache: no Face pain: no Toothache: no Ear pain: yes bilateral Ear pressure: no  Eyes red/itching:no Eye drainage/crusting: no  Vomiting: no Rash: no Fatigue: yes Sick contacts: no Strep contacts: no  Context: worse Recurrent sinusitis: no Relief with OTC cold/cough medications: no  Treatments attempted: Cold and flu    Past Medical History:  Diagnosis Date   Anxiety    Arthritis    COPD (chronic obstructive pulmonary disease) (HCC)    Depression    Fibromyalgia    IBS (irritable bowel syndrome)    PTSD (post-traumatic stress disorder)     Past Surgical History:  Procedure Laterality Date   ABDOMINAL HYSTERECTOMY     CARPAL TUNNEL RELEASE     KNEE SURGERY      History reviewed. No pertinent family history.  Social History   Socioeconomic History   Marital status: Legally Separated    Spouse name: Not on file   Number of children: Not on file   Years of education: Not on file   Highest education level: Not on file  Occupational History   Not on file  Tobacco Use   Smoking status: Every Day    Packs/day: 1.00    Years: 41.00    Pack years: 41.00    Types: Cigarettes    Smokeless tobacco: Never  Vaping Use   Vaping Use: Never used  Substance and Sexual Activity   Alcohol use: No   Drug use: No   Sexual activity: Not on file  Other Topics Concern   Not on file  Social History Narrative   Not on file   Social Determinants of Health   Financial Resource Strain: Not on file  Food Insecurity: Not on file  Transportation Needs: Not on file  Physical Activity: Not on file  Stress: Not on file  Social Connections: Not on file  Intimate Partner Violence: Not on file    Outpatient Medications Prior to Visit  Medication Sig Dispense Refill   acitretin (SORIATANE) 25 MG capsule Take 1 capsule by mouth daily.     albuterol (VENTOLIN HFA) 108 (90 Base) MCG/ACT inhaler Inhale 2 puffs into the lungs every 6 (six) hours as needed for wheezing or shortness of breath. 1 each 3   clonazePAM (KLONOPIN) 1 MG tablet Take 1 mg by mouth as needed for anxiety. 3 times daily as needed     fluticasone (FLONASE) 50 MCG/ACT nasal spray Place 2 sprays into both nostrils daily. 16 g 2   folic acid (FOLVITE) 1 MG tablet Take 1 mg by mouth daily.     gabapentin (NEURONTIN) 300 MG capsule Take 300 mg by mouth 3 (three) times daily.  loratadine (CLARITIN) 10 MG tablet Take 1 tablet (10 mg total) by mouth daily. 90 tablet 3   meclizine (ANTIVERT) 12.5 MG tablet Take 1 tablet (12.5 mg total) by mouth 3 (three) times daily as needed for dizziness. 60 tablet 1   methotrexate 250 MG/10ML injection Inject 25 mg into the vein once a week.     Multiple Vitamin (MULTIVITAMIN) tablet Take 1 tablet by mouth daily.     OLANZapine (ZYPREXA) 5 MG tablet Take 2.5 mg by mouth 2 (two) times daily.     senna (SENOKOT) 8.6 MG tablet Take 1 tablet by mouth as needed.     STIOLTO RESPIMAT 2.5-2.5 MCG/ACT AERS INHALE 2 PUFFS BY MOUTH INTO THE LUNGS ONCE DAILY 16 g 4   tiZANidine (ZANAFLEX) 4 MG tablet Take 1 tablet (4 mg total) by mouth every 6 (six) hours as needed for muscle spasms. 30 tablet  0   Adalimumab (HUMIRA PEN) 40 MG/0.4ML PNKT Inject into the skin.     DULoxetine (CYMBALTA) 60 MG capsule Take 2 capsules by mouth daily.     traZODone (DESYREL) 50 MG tablet Take 2 tablets by mouth daily. (Patient not taking: No sig reported)     VYVANSE 30 MG capsule Take 30 mg by mouth at bedtime. (Patient not taking: No sig reported)     chlorpheniramine-HYDROcodone (TUSSIONEX PENNKINETIC ER) 10-8 MG/5ML SUER Take 5 mLs by mouth every 12 (twelve) hours as needed for cough. Do not take with klonopin 50 mL 0   No facility-administered medications prior to visit.    No Known Allergies  Review of Systems  Constitutional:  Positive for fatigue. Negative for chills and fever.  HENT:  Positive for congestion, ear pain and sinus pressure. Negative for postnasal drip and sore throat.   Eyes: Negative.   Respiratory:  Positive for cough and shortness of breath.   Cardiovascular: Negative.   Gastrointestinal: Negative.   Genitourinary: Negative.   Musculoskeletal: Negative.   Skin: Negative.   Neurological: Negative.       Objective:    Physical Exam Vitals and nursing note reviewed.  Constitutional:      General: She is not in acute distress.    Appearance: Normal appearance.  HENT:     Head: Normocephalic.     Right Ear: Tympanic membrane, ear canal and external ear normal.     Left Ear: Tympanic membrane, ear canal and external ear normal.  Eyes:     Conjunctiva/sclera: Conjunctivae normal.  Cardiovascular:     Rate and Rhythm: Normal rate and regular rhythm.     Pulses: Normal pulses.     Heart sounds: Normal heart sounds.  Pulmonary:     Effort: Pulmonary effort is normal.     Breath sounds: Rhonchi (RLL) present.  Musculoskeletal:     Cervical back: Normal range of motion.  Skin:    General: Skin is warm.  Neurological:     General: No focal deficit present.     Mental Status: She is alert and oriented to person, place, and time.  Psychiatric:        Mood and  Affect: Mood normal.        Behavior: Behavior normal.        Thought Content: Thought content normal.        Judgment: Judgment normal.    BP 128/89   Pulse 88   Temp 97.9 F (36.6 C) (Oral)   Wt 136 lb 6 oz (61.9 kg)   SpO2 95%  BMI 23.95 kg/m  Wt Readings from Last 3 Encounters:  07/30/21 136 lb 6 oz (61.9 kg)  07/22/21 132 lb 6.4 oz (60.1 kg)  04/30/21 126 lb 8 oz (57.4 kg)    Health Maintenance Due  Topic Date Due   COVID-19 Vaccine (2 - Pfizer risk series) 10/31/2020   MAMMOGRAM  03/09/2021   INFLUENZA VACCINE  04/15/2021    There are no preventive care reminders to display for this patient.   Lab Results  Component Value Date   TSH 0.674 10/29/2020   Lab Results  Component Value Date   WBC 8.3 04/30/2021   HGB 13.9 04/30/2021   HCT 42.4 04/30/2021   MCV 100 (H) 04/30/2021   PLT 297 04/30/2021   Lab Results  Component Value Date   NA 142 04/30/2021   K 4.2 04/30/2021   CO2 21 04/30/2021   GLUCOSE 72 04/30/2021   BUN 15 04/30/2021   CREATININE 0.90 04/30/2021   BILITOT <0.2 04/30/2021   ALKPHOS 91 04/30/2021   AST 27 04/30/2021   ALT 18 04/30/2021   PROT 6.4 04/30/2021   ALBUMIN 4.3 04/30/2021   CALCIUM 9.1 04/30/2021   ANIONGAP 11 01/11/2018   EGFR 74 04/30/2021   Lab Results  Component Value Date   CHOL 217 (H) 04/30/2021   Lab Results  Component Value Date   HDL 54 04/30/2021   Lab Results  Component Value Date   LDLCALC 143 (H) 04/30/2021   Lab Results  Component Value Date   TRIG 111 04/30/2021   No results found for: CHOLHDL No results found for: HGBA1C     Assessment & Plan:   Problem List Items Addressed This Visit   None Visit Diagnoses     Acute cough    -  Primary   Still ongoing after 4 weeks. Will check chest x-ray. Start levaquin daily x10 days. Tussionex wasn't covered, will send in promethazine-dextromethorphan prn   Relevant Orders   DG Chest 2 View   Acute bronchitis with COPD ([place])       Ongoing x4  weeks. Ceftin helped ear infection, not bronchitis. Will treat with levaquin and check chest x-ray. Continue albuterol prn. F/U if not improving.   Relevant Medications   predniSONE (DELTASONE) 10 MG tablet   promethazine-dextromethorphan (PROMETHAZINE-DM) 6.25-15 MG/5ML syrup        Meds ordered this encounter  Medications   predniSONE (DELTASONE) 10 MG tablet    Sig: Take 6 tablets today, then 5 tablets tomorrow, then decrease by 1 tablet every day until gone    Dispense:  21 tablet    Refill:  0   levofloxacin (LEVAQUIN) 500 MG tablet    Sig: Take 1 tablet (500 mg total) by mouth daily.    Dispense:  10 tablet    Refill:  0   promethazine-dextromethorphan (PROMETHAZINE-DM) 6.25-15 MG/5ML syrup    Sig: Take 5 mLs by mouth 4 (four) times daily as needed for cough.    Dispense:  118 mL    Refill:  0      Charyl Dancer, NP

## 2021-07-31 ENCOUNTER — Ambulatory Visit
Admission: RE | Admit: 2021-07-31 | Discharge: 2021-07-31 | Disposition: A | Discharge status: Home / Self Care | Payer: Medicare Other | Attending: Nurse Practitioner | Admitting: Nurse Practitioner

## 2021-07-31 ENCOUNTER — Ambulatory Visit
Admission: RE | Admit: 2021-07-31 | Discharge: 2021-07-31 | Disposition: A | Discharge status: Home / Self Care | Payer: Medicare Other | Source: Ambulatory Visit | Attending: Nurse Practitioner | Admitting: Nurse Practitioner

## 2021-07-31 DIAGNOSIS — R051 Acute cough: Secondary | ICD-10-CM

## 2021-07-31 DIAGNOSIS — R062 Wheezing: Secondary | ICD-10-CM | POA: Diagnosis not present

## 2021-07-31 DIAGNOSIS — R0602 Shortness of breath: Secondary | ICD-10-CM | POA: Diagnosis not present

## 2021-07-31 DIAGNOSIS — R059 Cough, unspecified: Secondary | ICD-10-CM | POA: Diagnosis not present

## 2021-08-12 ENCOUNTER — Ambulatory Visit: Payer: Self-pay | Admitting: *Deleted

## 2021-08-12 MED ORDER — AMOXICILLIN-POT CLAVULANATE 875-125 MG PO TABS: 1.0000 | ORAL_TABLET | Freq: Two times a day (BID) | ORAL | 0 refills | Status: DC

## 2021-08-12 NOTE — Telephone Encounter (Signed)
Pt called in c/0 of new onset dizziness and pressure in her ears left worse than right.   She has been sick for several weeks.   Was seen on 07/30/2021 and given her second round of antibiotics but she is not feeling better.   Having sinus congestion and a scratchy throat this morning.   Body aches and fever too.    She is requesting Amoxicillin.   "That works for me".   She is coughing too but using OTC cough medication that is working fine.   The dizziness is constant since this morning.   "I can't drive into the office".   "Can she just call something else in for me?"   Amoxicillin works best for her and she is requesting that be called in if possible.  I sent my notes to [facility].

## 2021-08-12 NOTE — Addendum Note (Signed)
Addended by: Vance Peper A on: 08/12/2021 02:14 PM   Modules accepted: Orders

## 2021-08-12 NOTE — Telephone Encounter (Signed)
Reason for Disposition  Fever present > 3 days (72 hours)    Pt having dizziness and ear pressure started this morning.   She has completed 2 rounds of antibiotics and still feeling bad.  Answer Assessment - Initial Assessment Questions 1. DESCRIPTION: "Describe your dizziness."     Pt calling in c/o being dizzy and having fever this morning.   My whole body is so sore from coughing my head off.   I've finished 2 rounds of antibiotics.    I'm wheezing at night.   I'm hoping she can call something in.  I can't drive. My ears are bothering me like feel stopped up.   I have vertigo but this feels different.    My ears have a lot of pressure.   My throat is scratchy.   The dizziness started this morning.   I'm dizzy now laying here on the cough.  2. LIGHTHEADED: "Do you feel lightheaded?" (e.g., somewhat faint, woozy, weak upon standing)     Yes dizzy now.   My chest feels heavy with my breathing.   I had a chest x ray last week and no pneumonia.    3. VERTIGO: "Do you feel like either you or the room is spinning or tilting?" (i.e. vertigo)     I have vertigo but this is coming from the pressure in my  head and ears.    I have pressure in my sinuses too.   My nose is so stopped up. 4. SEVERITY: "How bad is it?"  "Do you feel like you are going to faint?" "Can you stand and walk?"   - MILD: Feels slightly dizzy, but walking normally.   - MODERATE: Feels unsteady when walking, but not falling; interferes with normal activities (e.g., school, work).   - SEVERE: Unable to walk without falling, or requires assistance to walk without falling; feels like passing out now.      I can walk to the bathroom fine. 5. ONSET:  "When did the dizziness begin?"     This morning 6. AGGRAVATING FACTORS: "Does anything make it worse?" (e.g., standing, change in head position)     It's a constant dizziness. 7. HEART RATE: "Can you tell me your heart rate?" "How many beats in 15 seconds?"  (Note: not all patients can do  this)       N/A 8. CAUSE: "What do you think is causing the dizziness?"     My ears have pressure in them.   They've been bothering me the whole time I've been sick but today they hurt.  Both ears.   Left is worse. 9. RECURRENT SYMPTOM: "Have you had dizziness before?" If Yes, ask: "When was the last time?" "What happened that time?"     Yes have vertigo but this is different.   I've been sick for weeks.   I finished my last antibiotic Sunday and I feel worse now and the cough is bad.   I'm using OTC cough medications.   I cough in spurts.   Coughing up yellow, white or brown colored sputum. I can't drive.   The fever is 100.   A low grade fever "knocks me down".     I help take care of my mother who is 63 yrs old.   I need to be able help take care of her.   10. OTHER SYMPTOMS: "Do you have any other symptoms?" (e.g., fever, chest pain, vomiting, diarrhea, bleeding)       Scratchy  throat, nose stopped, sinus pressure, and ear pressure, coughing and fever.   No diarrhea or vomiting. 11. PREGNANCY: "Is there any chance you are pregnant?" "When was your last menstrual period?"       N/A due to age  Protocols used: Dizziness - Lightheadedness-A-AH

## 2021-08-12 NOTE — Telephone Encounter (Addendum)
Can someone please call her and see if she is still taking methotrexate weekly? There is an interaction between amoxicillin and methotrexate. I see she has taken it several times and will prescribe.

## 2021-08-13 ENCOUNTER — Encounter: Payer: Self-pay | Admitting: Nurse Practitioner

## 2021-08-13 ENCOUNTER — Ambulatory Visit (INDEPENDENT_AMBULATORY_CARE_PROVIDER_SITE_OTHER): Payer: Medicare Other | Admitting: Nurse Practitioner

## 2021-08-13 ENCOUNTER — Other Ambulatory Visit: Payer: Self-pay

## 2021-08-13 VITALS — BP 115/82 | HR 83 | Temp 97.8°F | Resp 18

## 2021-08-13 DIAGNOSIS — J069 Acute upper respiratory infection, unspecified: Secondary | ICD-10-CM

## 2021-08-13 LAB — VERITOR FLU A/B WAIVED
Influenza A: NEGATIVE
Influenza B: NEGATIVE

## 2021-08-13 NOTE — Telephone Encounter (Signed)
Please advise 

## 2021-08-13 NOTE — Telephone Encounter (Signed)
Pt. Reports she is still having fever of 102.Has ear pain as well. Asking to be seen today. Appointment made for today per Iris in the practice.

## 2021-08-13 NOTE — Telephone Encounter (Signed)
Pt. Called back. Is still taking methotrexate. States she has taken Amoxicillin with it. States she is running a fever of 102. Has sore throat as well. Asking for cough medication as well. No virtual appointments available. States she doesn't "feel like driving to UC." Please advise pt.

## 2021-08-13 NOTE — Telephone Encounter (Signed)
I do not see this patient scheduled for today.

## 2021-08-13 NOTE — Telephone Encounter (Signed)
Needs appointment

## 2021-08-13 NOTE — Telephone Encounter (Signed)
Pt was seen in office today as a walk in

## 2021-08-13 NOTE — Progress Notes (Signed)
Acute Office Visit  Subjective:    Patient ID: Emily Johnston, female    DOB: 01-16-1961, 60 y.o.   MRN: [id number]  Chief Complaint  Patient presents with   URI    Worsening of symptoms in the last few days     HPI Patient is in today for ongoing URI. She started feeling slightly better, then developed a 102 fever 2 days ago. She has taken ceftin and levaquin.   UPPER RESPIRATORY TRACT INFECTION  Worst symptom: body aches Fever: yes 102 today Cough: yes Shortness of breath: yes - intermittent Wheezing: no Chest pain: yes, with cough Chest tightness: no Chest congestion: no Nasal congestion: yes Runny nose: yes Post nasal drip: yes Sneezing: no Sore throat: yes Swollen glands: no Sinus pressure: yes Headache: yes Face pain: no Toothache: no Ear pain: yes bilateral Ear pressure: yes bilateral Eyes red/itching:no Eye drainage/crusting: no  Vomiting: no Rash: no Fatigue: yes Sick contacts: no Strep contacts: no  Context: worse Recurrent sinusitis: yes Relief with OTC cold/cough medications:  mild   Treatments attempted: antibiotics, OTC cough, tylenol    Past Medical History:  Diagnosis Date   Anxiety    Arthritis    COPD (chronic obstructive pulmonary disease) (HCC)    Depression    Fibromyalgia    IBS (irritable bowel syndrome)    PTSD (post-traumatic stress disorder)     Past Surgical History:  Procedure Laterality Date   ABDOMINAL HYSTERECTOMY     CARPAL TUNNEL RELEASE     KNEE SURGERY      No family history on file.  Social History   Socioeconomic History   Marital status: Legally Separated    Spouse name: Not on file   Number of children: Not on file   Years of education: Not on file   Highest education level: Not on file  Occupational History   Not on file  Tobacco Use   Smoking status: Every Day    Packs/day: 1.00    Years: 41.00    Pack years: 41.00    Types: Cigarettes   Smokeless tobacco: Never  Vaping Use    Vaping Use: Never used  Substance and Sexual Activity   Alcohol use: No   Drug use: No   Sexual activity: Not on file  Other Topics Concern   Not on file  Social History Narrative   Not on file   Social Determinants of Health   Financial Resource Strain: Not on file  Food Insecurity: Not on file  Transportation Needs: Not on file  Physical Activity: Not on file  Stress: Not on file  Social Connections: Not on file  Intimate Partner Violence: Not on file    Outpatient Medications Prior to Visit  Medication Sig Dispense Refill   acitretin (SORIATANE) 25 MG capsule Take 1 capsule by mouth daily.     Adalimumab (HUMIRA PEN) 40 MG/0.4ML PNKT Inject into the skin.     albuterol (VENTOLIN HFA) 108 (90 Base) MCG/ACT inhaler Inhale 2 puffs into the lungs every 6 (six) hours as needed for wheezing or shortness of breath. 1 each 3   amoxicillin-clavulanate (AUGMENTIN) 875-125 MG tablet Take 1 tablet by mouth 2 (two) times daily. 20 tablet 0   clonazePAM (KLONOPIN) 1 MG tablet Take 1 mg by mouth as needed for anxiety. 3 times daily as needed     DULoxetine (CYMBALTA) 60 MG capsule Take 2 capsules by mouth daily.     fluticasone (FLONASE) 50 MCG/ACT nasal spray  Place 2 sprays into both nostrils daily. 16 g 2   folic acid (FOLVITE) 1 MG tablet Take 1 mg by mouth daily.     gabapentin (NEURONTIN) 300 MG capsule Take 300 mg by mouth 3 (three) times daily.     levofloxacin (LEVAQUIN) 500 MG tablet Take 1 tablet (500 mg total) by mouth daily. 10 tablet 0   loratadine (CLARITIN) 10 MG tablet Take 1 tablet (10 mg total) by mouth daily. 90 tablet 3   meclizine (ANTIVERT) 12.5 MG tablet Take 1 tablet (12.5 mg total) by mouth 3 (three) times daily as needed for dizziness. 60 tablet 1   methotrexate 250 MG/10ML injection Inject 25 mg into the vein once a week.     Multiple Vitamin (MULTIVITAMIN) tablet Take 1 tablet by mouth daily.     OLANZapine (ZYPREXA) 5 MG tablet Take 2.5 mg by mouth 2 (two) times  daily.     predniSONE (DELTASONE) 10 MG tablet Take 6 tablets today, then 5 tablets tomorrow, then decrease by 1 tablet every day until gone 21 tablet 0   promethazine-dextromethorphan (PROMETHAZINE-DM) 6.25-15 MG/5ML syrup Take 5 mLs by mouth 4 (four) times daily as needed for cough. 118 mL 0   senna (SENOKOT) 8.6 MG tablet Take 1 tablet by mouth as needed.     STIOLTO RESPIMAT 2.5-2.5 MCG/ACT AERS INHALE 2 PUFFS BY MOUTH INTO THE LUNGS ONCE DAILY 16 g 4   tiZANidine (ZANAFLEX) 4 MG tablet Take 1 tablet (4 mg total) by mouth every 6 (six) hours as needed for muscle spasms. 30 tablet 0   traZODone (DESYREL) 50 MG tablet Take 2 tablets by mouth daily. (Patient not taking: No sig reported)     VYVANSE 30 MG capsule Take 30 mg by mouth at bedtime. (Patient not taking: No sig reported)     No facility-administered medications prior to visit.    No Known Allergies  Review of Systems  Constitutional:  Positive for fatigue and fever.  HENT:  Positive for congestion, ear pain, postnasal drip, rhinorrhea, sinus pressure, sneezing and sore throat.   Eyes: Negative.   Respiratory:  Positive for cough and shortness of breath (intermittent).   Cardiovascular: Negative.   Gastrointestinal: Negative.   Endocrine: Negative.   Genitourinary: Negative.   Musculoskeletal:  Positive for myalgias.  Skin: Negative.   Neurological:  Positive for dizziness and headaches.  Psychiatric/Behavioral: Negative.        Objective:    Physical Exam Vitals and nursing note reviewed.  Constitutional:      General: She is not in acute distress.    Appearance: Normal appearance.  HENT:     Head: Normocephalic.     Right Ear: Ear canal and external ear normal. A middle ear effusion is present.     Left Ear: Ear canal and external ear normal. A middle ear effusion is present.  Eyes:     Conjunctiva/sclera: Conjunctivae normal.  Cardiovascular:     Rate and Rhythm: Normal rate and regular rhythm.     Pulses:  Normal pulses.     Heart sounds: Normal heart sounds.  Pulmonary:     Effort: Pulmonary effort is normal.     Breath sounds: Normal breath sounds.  Musculoskeletal:     Cervical back: Normal range of motion.  Skin:    General: Skin is warm.  Neurological:     General: No focal deficit present.     Mental Status: She is alert and oriented to person, place, and time.  Psychiatric:        Mood and Affect: Mood normal.        Behavior: Behavior normal.        Thought Content: Thought content normal.        Judgment: Judgment normal.    BP 115/82 (BP Location: Left Arm, Patient Position: Sitting)   Pulse 83   Temp 97.8 F (36.6 C) (Oral)   Resp 18   SpO2 95%  Wt Readings from Last 3 Encounters:  07/30/21 136 lb 6 oz (61.9 kg)  07/22/21 132 lb 6.4 oz (60.1 kg)  04/30/21 126 lb 8 oz (57.4 kg)    Health Maintenance Due  Topic Date Due   Zoster Vaccines- Shingrix (1 of 2) Never done   COVID-19 Vaccine (2 - Pfizer risk series) 10/31/2020   MAMMOGRAM  03/09/2021   INFLUENZA VACCINE  04/15/2021    There are no preventive care reminders to display for this patient.   Lab Results  Component Value Date   TSH 0.674 10/29/2020   Lab Results  Component Value Date   WBC 8.3 04/30/2021   HGB 13.9 04/30/2021   HCT 42.4 04/30/2021   MCV 100 (H) 04/30/2021   PLT 297 04/30/2021   Lab Results  Component Value Date   NA 142 04/30/2021   K 4.2 04/30/2021   CO2 21 04/30/2021   GLUCOSE 72 04/30/2021   BUN 15 04/30/2021   CREATININE 0.90 04/30/2021   BILITOT <0.2 04/30/2021   ALKPHOS 91 04/30/2021   AST 27 04/30/2021   ALT 18 04/30/2021   PROT 6.4 04/30/2021   ALBUMIN 4.3 04/30/2021   CALCIUM 9.1 04/30/2021   ANIONGAP 11 01/11/2018   EGFR 74 04/30/2021   Lab Results  Component Value Date   CHOL 217 (H) 04/30/2021   Lab Results  Component Value Date   HDL 54 04/30/2021   Lab Results  Component Value Date   LDLCALC 143 (H) 04/30/2021   Lab Results  Component Value  Date   TRIG 111 04/30/2021   No results found for: CHOLHDL No results found for: HGBA1C     Assessment & Plan:   Problem List Items Addressed This Visit   None Visit Diagnoses     Upper respiratory tract infection, unspecified type    -  Primary   Ongoing, with recent worsening of symptoms and fever check flu and covid-19. Treat with amoxicillin. F/U if not improving, ER precautions discussed   Relevant Orders   Novel Coronavirus, NAA ([facility])   Veritor Flu A/B Waived (Completed)        No orders of the defined types were placed in this encounter.    Charyl Dancer, NP

## 2021-08-15 DIAGNOSIS — Z79631 Methotrexate, long term, current use: Principal | ICD-10-CM

## 2021-08-15 DIAGNOSIS — J3089 Other allergic rhinitis: Principal | ICD-10-CM

## 2021-08-15 DIAGNOSIS — L4050 Arthropathic psoriasis, unspecified: Principal | ICD-10-CM

## 2021-08-15 LAB — SARS-COV-2, NAA 2 DAY TAT

## 2021-08-15 LAB — NOVEL CORONAVIRUS, NAA: SARS-CoV-2, NAA: DETECTED — AB

## 2021-08-15 MED ORDER — FLUTICASONE PROPIONATE 50 MCG/ACTUATION NASAL SPRAY,SUSPENSION
Freq: Every day | NASAL | 4 refills | 61.00000 days
Start: 2021-08-15 — End: 2022-08-15

## 2021-08-15 MED ORDER — METHOTREXATE SODIUM 25 MG/ML INJECTION SOLUTION
SUBCUTANEOUS | 0 refills | 28 days | Status: CP
Start: 2021-08-15 — End: ?

## 2021-08-15 MED ORDER — MOLNUPIRAVIR EUA 200MG CAPSULE: 4.0000 | ORAL_CAPSULE | Freq: Two times a day (BID) | ORAL | 0 refills | Status: AC

## 2021-08-15 NOTE — Unmapped (Signed)
[facility] Refill Coordination Note    Specialty Medication(s) to be Shipped:   Inflammatory Disorders: Humira and methotrexate (injectable)    Other medication(s) to be shipped: syringes, flonase     Michele Miller, DOB: 11-Apr-1961  Phone: [phone] (home)       All above HIPAA information was verified with patient.     Was a Nurse, learning disability used for this call? No    Completed refill call assessment today to schedule patient's medication shipment from the [facility] [phone]).  All relevant notes have been reviewed.     Specialty medication(s) and dose(s) confirmed: Regimen is correct and unchanged.   Changes to medications: [name] reports no changes at this time.  Changes to insurance: No  New side effects reported not previously addressed with a pharmacist or physician: None reported  Questions for the pharmacist: No    Confirmed patient received a Conservation officer, historic buildings and a Surveyor, mining with first shipment. The patient will receive a drug information handout for each medication shipped and additional FDA Medication Guides as required.       DISEASE/MEDICATION-SPECIFIC INFORMATION        For patients on injectable medications: Patient currently has 1 for humira, 2 for methotrexate doses left.  Next injection is scheduled for 12/3.    SPECIALTY MEDICATION ADHERENCE     Medication Adherence    Patient reported X missed doses in the last month: 0  Specialty Medication: Humira  Patient is on additional specialty medications: Yes  Additional Specialty Medications: Methotrexate  Patient Reported Additional Medication X Missed Doses in the Last Month: 0  Patient is on more than two specialty medications: No  Any gaps in refill history greater than 2 weeks in the last 3 months: no  Demonstrates understanding of importance of adherence: yes  Informant: patient              Were doses missed due to medication being on hold? No    Humira 40mg /0.26ml: Patient has 14 days of medication on hand  Methotrexate 25mg /ml: Patient has 14 days of medication on hand     REFERRAL TO PHARMACIST     Referral to the pharmacist: Not needed      [facility]     Shipping address confirmed in Epic.     Delivery Scheduled: Yes, Expected medication delivery date: 12/14.  However, Rx request for refills was sent to the provider as there are none remaining.     Medication will be delivered via Same Day Courier to the prescription address in Epic WAM.    [name]   [facility] Specialty Technician

## 2021-08-15 NOTE — Unmapped (Signed)
Methotrexate refill  Last Visit Date: Visit date not found  Next Visit Date: 09/18/2021    Sent mychart message about labs due. Last labs from 04/30/21 in care everywhere.     Lab Results   Component Value Date    ALT 25 05/08/2020    AST 28 05/08/2020    ALBUMIN 3.7 09/10/2019    CREATININE 0.72 02/06/2020     Lab Results   Component Value Date    WBC 6.6 05/08/2020    HGB 13.2 05/08/2020    HCT 39.4 05/08/2020    PLT 306 05/08/2020     Lab Results   Component Value Date    NEUTROPCT 61.9 09/10/2019    LYMPHOPCT 32 02/06/2020    MONOPCT 6 02/06/2020    EOSPCT 1 02/06/2020    BASOPCT 1 02/06/2020

## 2021-08-15 NOTE — Unmapped (Signed)
No longer my patient.

## 2021-08-15 NOTE — Addendum Note (Signed)
Addended by: Vance Peper A on: 08/15/2021 08:10 AM   Modules accepted: Orders

## 2021-08-20 ENCOUNTER — Telehealth: Payer: Self-pay | Admitting: Nurse Practitioner

## 2021-08-20 DIAGNOSIS — U071 COVID-19: Secondary | ICD-10-CM

## 2021-08-20 NOTE — Telephone Encounter (Signed)
She would like a covid-19 test since she recently had covid. She finished molnupiravir medication. She has an appointment tomorrow at [place].

## 2021-08-20 NOTE — Telephone Encounter (Signed)
Copied from [place] [phone]. Topic: General - Other >> Aug 20, 2021  2:18 PM Pawlus, Brayton Layman A wrote: Reason for CRM: Pt wanted to know if she could come in and get tested for Covid, pt wanted to make sure she no longer has it, please advise. >> Aug 20, 2021  2:21 PM Pawlus, Brayton Layman A wrote: Pt had office visit 11/29 and stated she finished up taking molnupiravir EUA (LAGEVRIO) 200 mg CAPS capsule

## 2021-08-21 ENCOUNTER — Other Ambulatory Visit: Payer: Medicare Other

## 2021-08-21 DIAGNOSIS — U071 COVID-19: Secondary | ICD-10-CM

## 2021-08-22 LAB — NOVEL CORONAVIRUS, NAA: SARS-CoV-2, NAA: NOT DETECTED

## 2021-08-28 DIAGNOSIS — L409 Psoriasis, unspecified: Principal | ICD-10-CM

## 2021-08-28 MED ORDER — HUMIRA PEN CITRATE FREE 40 MG/0.4 ML
SUBCUTANEOUS | 0 refills | 28.00000 days | Status: CP
Start: 2021-08-28 — End: 2021-09-27
  Filled 2021-08-28: qty 2, 28d supply, fill #0

## 2021-08-28 MED FILL — METHOTREXATE SODIUM 25 MG/ML INJECTION SOLUTION: SUBCUTANEOUS | 28 days supply | Qty: 4 | Fill #0

## 2021-08-28 MED FILL — BD TUBERCULIN SYRINGE 1 ML 27 X 1/2": 28 days supply | Qty: 4 | Fill #1

## 2021-08-28 NOTE — Unmapped (Signed)
Refilled 1 month, pt needs follow up appointment and repeat Quant Gold for further Humira refills

## 2021-09-03 ENCOUNTER — Ambulatory Visit (INDEPENDENT_AMBULATORY_CARE_PROVIDER_SITE_OTHER): Payer: Medicare Other | Admitting: *Deleted

## 2021-09-03 DIAGNOSIS — Z0000 Encounter for general adult medical examination without abnormal findings: Secondary | ICD-10-CM | POA: Diagnosis not present

## 2021-09-03 NOTE — Progress Notes (Signed)
Subjective:   Emily Johnston is a 60 y.o. female who presents for Medicare Annual (Subsequent) preventive examination.  I connected with  Idell Pickles on 09/03/21 by a telephone enabled telemedicine application and verified that I am speaking with the correct person using two identifiers.   I discussed the limitations of evaluation and management by telemedicine. The patient expressed understanding and agreed to proceed.  Patient location: home  Provider location:  Tele-health  not in clinic    Review of Systems     Cardiac Risk Factors include: advanced age (>30men, >64 women);sedentary lifestyle;hypertension     Objective:    Today's Vitals   09/03/21 1211  PainSc: 3    There is no height or weight on file to calculate BMI.  Advanced Directives 09/03/2021 08/18/2017 02/19/2017  Does Patient Have a Medical Advance Directive? No No No  Would patient like information on creating a medical advance directive? No - Patient declined No - Patient declined -    Current Medications (verified) Outpatient Encounter Medications as of 09/03/2021  Medication Sig   acitretin (SORIATANE) 25 MG capsule Take 1 capsule by mouth daily.   albuterol (VENTOLIN HFA) 108 (90 Base) MCG/ACT inhaler Inhale 2 puffs into the lungs every 6 (six) hours as needed for wheezing or shortness of breath.   clonazePAM (KLONOPIN) 1 MG tablet Take 1 mg by mouth as needed for anxiety. 3 times daily as needed   DULoxetine (CYMBALTA) 60 MG capsule Take 2 capsules by mouth daily.   fluticasone (FLONASE) 50 MCG/ACT nasal spray Place 2 sprays into both nostrils daily.   folic acid (FOLVITE) 1 MG tablet Take 1 mg by mouth daily.   gabapentin (NEURONTIN) 300 MG capsule Take 300 mg by mouth 3 (three) times daily.   loratadine (CLARITIN) 10 MG tablet Take 1 tablet (10 mg total) by mouth daily.   meclizine (ANTIVERT) 12.5 MG tablet Take 1 tablet (12.5 mg total) by mouth 3 (three) times daily as needed for  dizziness.   methotrexate 250 MG/10ML injection Inject 25 mg into the vein once a week.   Multiple Vitamin (MULTIVITAMIN) tablet Take 1 tablet by mouth daily.   OLANZapine (ZYPREXA) 5 MG tablet Take 2.5 mg by mouth 2 (two) times daily.   promethazine-dextromethorphan (PROMETHAZINE-DM) 6.25-15 MG/5ML syrup Take 5 mLs by mouth 4 (four) times daily as needed for cough.   senna (SENOKOT) 8.6 MG tablet Take 1 tablet by mouth as needed.   STIOLTO RESPIMAT 2.5-2.5 MCG/ACT AERS INHALE 2 PUFFS BY MOUTH INTO THE LUNGS ONCE DAILY   Adalimumab (HUMIRA PEN) 40 MG/0.4ML PNKT Inject into the skin.   amoxicillin-clavulanate (AUGMENTIN) 875-125 MG tablet Take 1 tablet by mouth 2 (two) times daily. (Patient not taking: Reported on 09/03/2021)   levofloxacin (LEVAQUIN) 500 MG tablet Take 1 tablet (500 mg total) by mouth daily. (Patient not taking: Reported on 09/03/2021)   predniSONE (DELTASONE) 10 MG tablet Take 6 tablets today, then 5 tablets tomorrow, then decrease by 1 tablet every day until gone (Patient not taking: Reported on 09/03/2021)   tiZANidine (ZANAFLEX) 4 MG tablet Take 1 tablet (4 mg total) by mouth every 6 (six) hours as needed for muscle spasms. (Patient not taking: Reported on 09/03/2021)   traZODone (DESYREL) 50 MG tablet Take 2 tablets by mouth daily. (Patient not taking: Reported on 09/03/2021)   VYVANSE 30 MG capsule Take 30 mg by mouth at bedtime. (Patient not taking: Reported on 07/22/2021)   No facility-administered encounter medications on  file as of 09/03/2021.    Allergies (verified) Patient has no known allergies.   History: Past Medical History:  Diagnosis Date   Anxiety    Arthritis    COPD (chronic obstructive pulmonary disease) (HCC)    Depression    Fibromyalgia    IBS (irritable bowel syndrome)    PTSD (post-traumatic stress disorder)    Past Surgical History:  Procedure Laterality Date   ABDOMINAL HYSTERECTOMY     CARPAL TUNNEL RELEASE     KNEE SURGERY      History reviewed. No pertinent family history. Social History   Socioeconomic History   Marital status: Legally Separated    Spouse name: Not on file   Number of children: Not on file   Years of education: Not on file   Highest education level: Not on file  Occupational History   Not on file  Tobacco Use   Smoking status: Every Day    Packs/day: 1.00    Years: 41.00    Pack years: 41.00    Types: Cigarettes   Smokeless tobacco: Never  Vaping Use   Vaping Use: Never used  Substance and Sexual Activity   Alcohol use: No   Drug use: No   Sexual activity: Not on file  Other Topics Concern   Not on file  Social History Narrative   Not on file   Social Determinants of Health   Financial Resource Strain: Low Risk    Difficulty of Paying Living Expenses: Not hard at all  Food Insecurity: No Food Insecurity   Worried About Charity fundraiser in the Last Year: Never true   Ran Out of Food in the Last Year: Never true  Transportation Needs: No Transportation Needs   Lack of Transportation (Medical): No   Lack of Transportation (Non-Medical): No  Physical Activity: Insufficiently Active   Days of Exercise per Week: 3 days   Minutes of Exercise per Session: 30 min  Stress: No Stress Concern Present   Feeling of Stress : Not at all  Social Connections: Socially Isolated   Frequency of Communication with Friends and Family: Once a week   Frequency of Social Gatherings with Friends and Family: Twice a week   Attends Religious Services: Never   Printmaker: No   Attends Music therapist: Never   Marital Status: Separated    Tobacco Counseling Ready to quit: Not Answered Counseling given: Not Answered   Clinical Intake:  Pre-visit preparation completed: Yes  Pain : 0-10 Pain Score: 3  Pain Location: Ear Pain Descriptors / Indicators: Aching, Heaviness Pain Onset: 1 to 4 weeks ago Pain Frequency: Constant      Nutritional Risks: None Diabetes: No  How often do you need to have someone help you when you read instructions, pamphlets, or other written materials from your doctor or pharmacy?: 1 - Never  Diabetic?  no  Interpreter Needed?: No  Information entered by :: Leroy Kennedy LPN   Activities of Daily Living In your present state of health, do you have any difficulty performing the following activities: 09/03/2021 09/03/2021  Hearing? N N  Vision? N N  Difficulty concentrating or making decisions? N N  Walking or climbing stairs? Y N  Comment shortness of breath -  Dressing or bathing? N N  Doing errands, shopping? N N  Preparing Food and eating ? N N  Using the Toilet? N N  In the past six months, have you accidently leaked  urine? N N  Do you have problems with loss of bowel control? N N  Managing your Medications? N N  Managing your Finances? N N  Housekeeping or managing your Housekeeping? N N  Some recent data might be hidden    Patient Care Team: Venita Lick, NP as PCP - General (Nurse Practitioner)  Indicate any recent Medical Services you may have received from other than [facility] providers in the past year (date may be approximate).     Assessment:   This is a routine wellness examination for [place].  Hearing/Vision screen Hearing Screening - Comments:: No hearing troubles Vision Screening - Comments:: [facility] Not up to date  Dietary issues and exercise activities discussed: Current Exercise Habits: Home exercise routine, Type of exercise: walking, Time (Minutes): 35, Frequency (Times/Week): 2, Weekly Exercise (Minutes/Week): 70, Intensity: Mild   Goals Addressed             This Visit's Progress    Increase physical activity         Depression Screen PHQ 2/9 Scores 09/03/2021 07/30/2021 04/30/2021 10/29/2020 09/19/2020 08/13/2020 07/10/2020  PHQ - 2 Score 4 5 6 6 6 6 2   PHQ- 9 Score 8 19 17  - 16 21 6     Fall Risk Fall Risk  10/29/2020  09/19/2020 09/19/2020 02/22/2019  Falls in the past year? 0 1 1 1   Number falls in past yr: - - - 1  Injury with Fall? - 1 1 1   Risk for fall due to : - Impaired balance/gait - History of fall(s)  Follow up - - - Falls evaluation completed    [place]:  Any stairs in or around the home? No  If so, are there any without handrails? No  Home free of loose throw rugs in walkways, pet beds, electrical cords, etc? Yes  Adequate lighting in your home to reduce risk of falls? Yes   ASSISTIVE DEVICES UTILIZED TO PREVENT FALLS:  Life alert? No  Use of a cane, walker or w/c? No  Grab bars in the bathroom? No  Shower chair or bench in shower? No  Elevated toilet seat or a handicapped toilet? No   TIMED UP AND GO:  Was the test performed? No .    Cognitive Function:  Normal cognitive status assessed by direct observation by this Nurse Health Advisor. No abnormalities found.            Immunizations Immunization History  Administered Date(s) Administered   Influenza, Seasonal, Injecte, Preservative Fre 06/22/2012   Influenza,inj,Quad PF,6+ Mos 09/26/2015, 06/23/2016, 10/25/2018, 06/01/2019, 07/10/2020   Influenza-Unspecified 07/03/2013   PFIZER(Purple Top)SARS-COV-2 Vaccination 10/10/2020   Pneumococcal Polysaccharide-23 11/03/2013    TDAP status: Due, Education has been provided regarding the importance of this vaccine. Advised may receive this vaccine at local pharmacy or Health Dept. Aware to provide a copy of the vaccination record if obtained from local pharmacy or Health Dept. Verbalized acceptance and understanding.  Flu Vaccine status: Due, Education has been provided regarding the importance of this vaccine. Advised may receive this vaccine at local pharmacy or Health Dept. Aware to provide a copy of the vaccination record if obtained from local pharmacy or Health Dept. Verbalized acceptance and understanding.  Pneumococcal vaccine status: Up  to date  Covid-19 vaccine status: Information provided on how to obtain vaccines.   Qualifies for Shingles Vaccine? Yes   Zostavax completed No   Shingrix Completed?: No.    Education has been provided  regarding the importance of this vaccine. Patient has been advised to call insurance company to determine out of pocket expense if they have not yet received this vaccine. Advised may also receive vaccine at local pharmacy or Health Dept. Verbalized acceptance and understanding.  Screening Tests Health Maintenance  Topic Date Due   Zoster Vaccines- Shingrix (1 of 2) Never done   COVID-19 Vaccine (2 - Pfizer risk series) 10/31/2020   MAMMOGRAM  03/09/2021   INFLUENZA VACCINE  04/15/2021   COLONOSCOPY (Pts 45-33yrs Insurance coverage will need to be confirmed)  09/19/2021 (Originally 02/03/2007)   Pneumococcal Vaccine 61-74 Years old (2 - PCV) 02/04/2027 (Originally 11/03/2014)   PAP SMEAR-Modifier  05/31/2024   TETANUS/TDAP  11/19/2027   Hepatitis C Screening  Completed   HIV Screening  Completed   HPV VACCINES  Aged Out    Health Maintenance  Health Maintenance Due  Topic Date Due   Zoster Vaccines- Shingrix (1 of 2) Never done   COVID-19 Vaccine (2 - Pfizer risk series) 10/31/2020   MAMMOGRAM  03/09/2021   INFLUENZA VACCINE  04/15/2021    Cologuard ordered  Mammogram status: Ordered  . Pt provided with contact info and advised to call to schedule appt.     Lung Cancer Screening: (Low Dose CT Chest recommended if Age 30-80 years, 30 pack-year currently smoking OR have quit w/in 15years.) does qualify.     Additional Screening:  Hepatitis C Screening: does not qualify; Completed 2022  Vision Screening: Recommended annual ophthalmology exams for early detection of glaucoma and other disorders of the eye. Is the patient up to date with their annual eye exam?  Yes  Who is the provider or what is the name of the office in which the patient attends annual eye exams? [facility] If pt is not established with a provider, would they like to be referred to a provider to establish care? No .   Dental Screening: Recommended annual dental exams for proper oral hygiene  Community Resource Referral / Chronic Care Management: CRR required this visit?  No   CCM required this visit?  No      Plan:     I have personally reviewed and noted the following in the patients chart:   Medical and social history Use of alcohol, tobacco or illicit drugs  Current medications and supplements including opioid prescriptions.  Functional ability and status Nutritional status Physical activity Advanced directives List of other physicians Hospitalizations, surgeries, and ER visits in previous 12 months Vitals Screenings to include cognitive, depression, and falls Referrals and appointments  In addition, I have reviewed and discussed with patient certain preventive protocols, quality metrics, and best practice recommendations. A written personalized care plan for preventive services as well as general preventive health recommendations were provided to patient.     Leroy Kennedy, LPN   03/49/1791   Nurse Notes: patient states she is still having fluid in ear. Has done two rounds of antibiotics.   Advised to call office

## 2021-09-03 NOTE — Patient Instructions (Addendum)
Emily Johnston , Thank you for taking time to come for your Medicare Wellness Visit. I appreciate your ongoing commitment to your health goals. Please review the following plan we discussed and let me know if I can assist you in the future.   Screening recommendations/referrals: Colonoscopy: Education provided Mammogram: Education provided  Recommended yearly ophthalmology/optometry visit for glaucoma screening and checkup Recommended yearly dental visit for hygiene and checkup  Vaccinations: Influenza vaccine: Education provided Pneumococcal vaccine: up to date Tdap vaccine: Education provided Shingles vaccine: Education provided    Advanced directives: Education provided  Conditions/risks identified:   Next appointment: 11-01-2021 @1 :[place] 40-64 and Older, Female Preventive care refers to lifestyle choices and visits with your health care provider that can promote health and wellness. What does preventive care include? A yearly physical exam. This is also called an annual well check. Dental exams once or twice a year. Routine eye exams. Ask your health care provider how often you should have your eyes checked. Personal lifestyle choices, including: Daily care of your teeth and gums. Regular physical activity. Eating a healthy diet. Avoiding tobacco and drug use. Limiting alcohol use. Practicing safe sex. Taking low-dose aspirin every day. Taking vitamin and mineral supplements as recommended by your health care provider. What happens during an annual well check? The services and screenings done by your health care provider during your annual well check will depend on your age, overall health, lifestyle risk factors, and family history of disease. Counseling  Your health care provider may ask you questions about your: Alcohol use. Tobacco use. Drug use. Emotional well-being. Home and relationship well-being. Sexual activity. Eating  habits. History of falls. Memory and ability to understand (cognition). Work and work Statistician. Reproductive health. Screening  You may have the following tests or measurements: Height, weight, and BMI. Blood pressure. Lipid and cholesterol levels. These may be checked every 5 years, or more frequently if you are over 42 years old. Skin check. Lung cancer screening. You may have this screening every year starting at age 60 if you have a 30-pack-year history of smoking and currently smoke or have quit within the past 15 years. Fecal occult blood test (FOBT) of the stool. You may have this test every year starting at age 3. Flexible sigmoidoscopy or colonoscopy. You may have a sigmoidoscopy every 5 years or a colonoscopy every 10 years starting at age 62. Hepatitis C blood test. Hepatitis B blood test. Sexually transmitted disease (STD) testing. Diabetes screening. This is done by checking your blood sugar (glucose) after you have not eaten for a while (fasting). You may have this done every 1-3 years. Bone density scan. This is done to screen for osteoporosis. You may have this done starting at age 30. Mammogram. This may be done every 1-2 years. Talk to your health care provider about how often you should have regular mammograms. Talk with your health care provider about your test results, treatment options, and if necessary, the need for more tests. Vaccines  Your health care provider may recommend certain vaccines, such as: Influenza vaccine. This is recommended every year. Tetanus, diphtheria, and acellular pertussis (Tdap, Td) vaccine. You may need a Td booster every 10 years. Zoster vaccine. You may need this after age 56. Pneumococcal 13-valent conjugate (PCV13) vaccine. One dose is recommended after age 58. Pneumococcal polysaccharide (PPSV23) vaccine. One dose is recommended after age 46. Talk to your health care provider about which screenings and vaccines you need and  how  often you need them. This information is not intended to replace advice given to you by your health care provider. Make sure you discuss any questions you have with your health care provider. Document Released: 09/28/2015 Document Revised: 05/21/2016 Document Reviewed: 07/03/2015 Elsevier Interactive Patient Education  2017 [place] Prevention in the Home Falls can cause injuries. They can happen to people of all ages. There are many things you can do to make your home safe and to help prevent falls. What can I do on the outside of my home? Regularly fix the edges of walkways and driveways and fix any cracks. Remove anything that might make you trip as you walk through a door, such as a raised step or threshold. Trim any bushes or trees on the path to your home. Use bright outdoor lighting. Clear any walking paths of anything that might make someone trip, such as rocks or tools. Regularly check to see if handrails are loose or broken. Make sure that both sides of any steps have handrails. Any raised decks and porches should have guardrails on the edges. Have any leaves, snow, or ice cleared regularly. Use sand or salt on walking paths during winter. Clean up any spills in your garage right away. This includes oil or grease spills. What can I do in the bathroom? Use night lights. Install grab bars by the toilet and in the tub and shower. Do not use towel bars as grab bars. Use non-skid mats or decals in the tub or shower. If you need to sit down in the shower, use a plastic, non-slip stool. Keep the floor dry. Clean up any water that spills on the floor as soon as it happens. Remove soap buildup in the tub or shower regularly. Attach bath mats securely with double-sided non-slip rug tape. Do not have throw rugs and other things on the floor that can make you trip. What can I do in the bedroom? Use night lights. Make sure that you have a light by your bed that is easy to  reach. Do not use any sheets or blankets that are too big for your bed. They should not hang down onto the floor. Have a firm chair that has side arms. You can use this for support while you get dressed. Do not have throw rugs and other things on the floor that can make you trip. What can I do in the kitchen? Clean up any spills right away. Avoid walking on wet floors. Keep items that you use a lot in easy-to-reach places. If you need to reach something above you, use a strong step stool that has a grab bar. Keep electrical cords out of the way. Do not use floor polish or wax that makes floors slippery. If you must use wax, use non-skid floor wax. Do not have throw rugs and other things on the floor that can make you trip. What can I do with my stairs? Do not leave any items on the stairs. Make sure that there are handrails on both sides of the stairs and use them. Fix handrails that are broken or loose. Make sure that handrails are as long as the stairways. Check any carpeting to make sure that it is firmly attached to the stairs. Fix any carpet that is loose or worn. Avoid having throw rugs at the top or bottom of the stairs. If you do have throw rugs, attach them to the floor with carpet tape. Make sure that you have  a light switch at the top of the stairs and the bottom of the stairs. If you do not have them, ask someone to add them for you. What else can I do to help prevent falls? Wear shoes that: Do not have high heels. Have rubber bottoms. Are comfortable and fit you well. Are closed at the toe. Do not wear sandals. If you use a stepladder: Make sure that it is fully opened. Do not climb a closed stepladder. Make sure that both sides of the stepladder are locked into place. Ask someone to hold it for you, if possible. Clearly mark and make sure that you can see: Any grab bars or handrails. First and last steps. Where the edge of each step is. Use tools that help you move  around (mobility aids) if they are needed. These include: Canes. Walkers. Scooters. Crutches. Turn on the lights when you go into a dark area. Replace any light bulbs as soon as they burn out. Set up your furniture so you have a clear path. Avoid moving your furniture around. If any of your floors are uneven, fix them. If there are any pets around you, be aware of where they are. Review your medicines with your doctor. Some medicines can make you feel dizzy. This can increase your chance of falling. Ask your doctor what other things that you can do to help prevent falls. This information is not intended to replace advice given to you by your health care provider. Make sure you discuss any questions you have with your health care provider. Document Released: 06/28/2009 Document Revised: 02/07/2016 Document Reviewed: 10/06/2014 Elsevier Interactive Patient Education  2017 [employer].

## 2021-09-05 ENCOUNTER — Ambulatory Visit: Payer: Self-pay | Admitting: *Deleted

## 2021-09-05 NOTE — Telephone Encounter (Signed)
Second attempt to reach pt. Unable to leave message.

## 2021-09-05 NOTE — Telephone Encounter (Signed)
°  Chief Complaint: Left ear pain Symptoms: Ear pain, feels full Frequency: Started his week Pertinent Negatives: Patient denies fever Disposition: [] ED /[] Urgent Care (no appt availability in office) / [] Appointment(In office/virtual)/ []  [facility]/ [] Home Care/ [] Refused Recommended Disposition  Additional Notes: Pt. Reports she has been seen 3 times in office with COVID 19 and URI. Has had redness and fluid "in my ear at every visit." Now pain is moderate-severe. Could not sleep last night. Asking for antibiotic and pain "drops for my ear." Please advise pt. Offered appointment. "I can't come out in this weather."        Answer Assessment - Initial Assessment Questions 1. LOCATION: "Which ear is involved?"     Left ear 2. ONSET: "When did the ear start hurting"      This week 3. SEVERITY: "How bad is the pain?"  (Scale 1-10; mild, moderate or severe)   - MILD (1-3): doesn't interfere with normal activities    - MODERATE (4-7): interferes with normal activities or awakens from sleep    - SEVERE (8-10): excruciating pain, unable to do any normal activities      Moderate-severe 4. URI SYMPTOMS: "Do you have a runny nose or cough?"     No 5. FEVER: "Do you have a fever?" If Yes, ask: "What is your temperature, how was it measured, and when did it start?"     No 6. CAUSE: "Have you been swimming recently?", "How often do you use Q-TIPS?", "Have you had any recent air travel or scuba diving?"     No 7. OTHER SYMPTOMS: "Do you have any other symptoms?" (e.g., headache, stiff neck, dizziness, vomiting, runny nose, decreased hearing)     No 8. PREGNANCY: "Is there any chance you are pregnant?" "When was your last menstrual period?"     No  Protocols used: Bethann Punches

## 2021-09-05 NOTE — Telephone Encounter (Signed)
Summary: earache/fluid in ear   Patient called in with ear ache and fluid in her ear. She is asking for meds to be sent to [place], [place]  Phone: [phone]  Fax: [phone]      Called patient to review sx of earache and fluid. No answer, unable to leave message to call clinic back.

## 2021-09-06 ENCOUNTER — Ambulatory Visit (INDEPENDENT_AMBULATORY_CARE_PROVIDER_SITE_OTHER): Payer: Medicare Other | Admitting: Nurse Practitioner

## 2021-09-06 ENCOUNTER — Other Ambulatory Visit: Payer: Self-pay

## 2021-09-06 ENCOUNTER — Encounter: Payer: Self-pay | Admitting: Nurse Practitioner

## 2021-09-06 VITALS — BP 121/82 | HR 92 | Temp 98.1°F | Ht 63.27 in | Wt 138.6 lb

## 2021-09-06 DIAGNOSIS — L234 Allergic contact dermatitis due to dyes: Secondary | ICD-10-CM | POA: Diagnosis not present

## 2021-09-06 MED ORDER — PREDNISONE 10 MG PO TABS: ORAL_TABLET | ORAL | 0 refills | Status: DC

## 2021-09-06 NOTE — Progress Notes (Signed)
Acute Office Visit  Subjective:    Patient ID: Emily Johnston, female    DOB: 12-14-1960, 60 y.o.   MRN: [id number]  Chief Complaint  Patient presents with   Ear Pain    Outside behind ears, Left ear more than right. Started about a week after she had her hair dyed    HPI Patient is in today for left ear pain for about a week after getting her hair dyed. She states that sometimes she has a reaction to the dye and the hair stylist usually puts an ointment around her forehead and behind her ears first.   EAR PAIN  Duration: days Involved ear(s): left Severity:  severe  Quality:  itching, oozing Fever: no Otorrhea: no Upper respiratory infection symptoms: no Pruritus: yes Hearing loss: no Water immersion no Using Q-tips: no Recurrent otitis media: no Status: stable Treatments attempted:  antihistamine   Past Medical History:  Diagnosis Date   Anxiety    Arthritis    COPD (chronic obstructive pulmonary disease) (HCC)    Depression    Fibromyalgia    IBS (irritable bowel syndrome)    PTSD (post-traumatic stress disorder)     Past Surgical History:  Procedure Laterality Date   ABDOMINAL HYSTERECTOMY     CARPAL TUNNEL RELEASE     KNEE SURGERY      No family history on file.  Social History   Socioeconomic History   Marital status: Legally Separated    Spouse name: Not on file   Number of children: Not on file   Years of education: Not on file   Highest education level: Not on file  Occupational History   Not on file  Tobacco Use   Smoking status: Every Day    Packs/day: 1.00    Years: 41.00    Pack years: 41.00    Types: Cigarettes   Smokeless tobacco: Never  Vaping Use   Vaping Use: Never used  Substance and Sexual Activity   Alcohol use: No   Drug use: No   Sexual activity: Not on file  Other Topics Concern   Not on file  Social History Narrative   Not on file   Social Determinants of Health   Financial Resource Strain: Low Risk     Difficulty of Paying Living Expenses: Not hard at all  Food Insecurity: No Food Insecurity   Worried About Charity fundraiser in the Last Year: Never true   Ran Out of Food in the Last Year: Never true  Transportation Needs: No Transportation Needs   Lack of Transportation (Medical): No   Lack of Transportation (Non-Medical): No  Physical Activity: Insufficiently Active   Days of Exercise per Week: 3 days   Minutes of Exercise per Session: 30 min  Stress: No Stress Concern Present   Feeling of Stress : Not at all  Social Connections: Socially Isolated   Frequency of Communication with Friends and Family: Once a week   Frequency of Social Gatherings with Friends and Family: Twice a week   Attends Religious Services: Never   Marine scientist or Organizations: No   Attends Music therapist: Never   Marital Status: Separated  Intimate Partner Violence: Not At Risk   Fear of Current or Ex-Partner: No   Emotionally Abused: No   Physically Abused: No   Sexually Abused: No    Outpatient Medications Prior to Visit  Medication Sig Dispense Refill   acitretin (SORIATANE) 25 MG capsule Take  1 capsule by mouth daily.     albuterol (VENTOLIN HFA) 108 (90 Base) MCG/ACT inhaler Inhale 2 puffs into the lungs every 6 (six) hours as needed for wheezing or shortness of breath. 1 each 3   clonazePAM (KLONOPIN) 1 MG tablet Take 1 mg by mouth as needed for anxiety. 3 times daily as needed     fluticasone (FLONASE) 50 MCG/ACT nasal spray Place 2 sprays into both nostrils daily. 16 g 2   folic acid (FOLVITE) 1 MG tablet Take 1 mg by mouth daily.     gabapentin (NEURONTIN) 300 MG capsule Take 300 mg by mouth 3 (three) times daily.     loratadine (CLARITIN) 10 MG tablet Take 1 tablet (10 mg total) by mouth daily. 90 tablet 3   meclizine (ANTIVERT) 12.5 MG tablet Take 1 tablet (12.5 mg total) by mouth 3 (three) times daily as needed for dizziness. 60 tablet 1   methotrexate 250 MG/10ML  injection Inject 25 mg into the vein once a week.     Multiple Vitamin (MULTIVITAMIN) tablet Take 1 tablet by mouth daily.     OLANZapine (ZYPREXA) 5 MG tablet Take 2.5 mg by mouth 2 (two) times daily.     senna (SENOKOT) 8.6 MG tablet Take 1 tablet by mouth as needed.     STIOLTO RESPIMAT 2.5-2.5 MCG/ACT AERS INHALE 2 PUFFS BY MOUTH INTO THE LUNGS ONCE DAILY 16 g 4   traZODone (DESYREL) 50 MG tablet Take 2 tablets by mouth daily.     amoxicillin-clavulanate (AUGMENTIN) 875-125 MG tablet Take 1 tablet by mouth 2 (two) times daily. 20 tablet 0   levofloxacin (LEVAQUIN) 500 MG tablet Take 1 tablet (500 mg total) by mouth daily. 10 tablet 0   predniSONE (DELTASONE) 10 MG tablet Take 6 tablets today, then 5 tablets tomorrow, then decrease by 1 tablet every day until gone 21 tablet 0   promethazine-dextromethorphan (PROMETHAZINE-DM) 6.25-15 MG/5ML syrup Take 5 mLs by mouth 4 (four) times daily as needed for cough. 118 mL 0   Adalimumab (HUMIRA PEN) 40 MG/0.4ML PNKT Inject into the skin.     DULoxetine (CYMBALTA) 60 MG capsule Take 2 capsules by mouth daily.     meloxicam (MOBIC) 7.5 MG tablet Mobic 7.5 mg tablet  Take 1 tablet every 12 hours by oral route.     tiZANidine (ZANAFLEX) 4 MG tablet Take 1 tablet (4 mg total) by mouth every 6 (six) hours as needed for muscle spasms. (Patient not taking: Reported on 09/06/2021) 30 tablet 0   VYVANSE 30 MG capsule Take 30 mg by mouth at bedtime. (Patient not taking: Reported on 09/06/2021)     No facility-administered medications prior to visit.    No Known Allergies  Review of Systems  Constitutional:  Positive for fatigue. Negative for fever.  HENT: Negative.    Respiratory: Negative.    Cardiovascular: Negative.   Genitourinary: Negative.   Skin:  Positive for rash (behind left ear and throughout scalp).  Neurological: Negative.       Objective:    Physical Exam Vitals and nursing note reviewed.  Constitutional:      General: She is not in  acute distress.    Appearance: Normal appearance.  HENT:     Head: Normocephalic.  Eyes:     Conjunctiva/sclera: Conjunctivae normal.  Cardiovascular:     Rate and Rhythm: Normal rate and regular rhythm.     Pulses: Normal pulses.     Heart sounds: Normal heart sounds.  Pulmonary:  Effort: Pulmonary effort is normal.     Breath sounds: Normal breath sounds.  Musculoskeletal:     Cervical back: Normal range of motion.  Skin:    General: Skin is warm.     Findings: Erythema and rash present.     Comments: Erythematous area behind left ear. Small red bumps on the back of her head and throughout the scalp.   Neurological:     General: No focal deficit present.     Mental Status: She is alert and oriented to person, place, and time.  Psychiatric:        Mood and Affect: Mood normal.        Behavior: Behavior normal.        Thought Content: Thought content normal.        Judgment: Judgment normal.    BP 121/82    Pulse 92    Temp 98.1 F (36.7 C) (Oral)    Ht 5' 3.27" (1.607 m)    Wt 138 lb 9.6 oz (62.9 kg)    SpO2 96%    BMI 24.34 kg/m  Wt Readings from Last 3 Encounters:  09/06/21 138 lb 9.6 oz (62.9 kg)  07/30/21 136 lb 6 oz (61.9 kg)  07/22/21 132 lb 6.4 oz (60.1 kg)    Health Maintenance Due  Topic Date Due   Zoster Vaccines- Shingrix (1 of 2) Never done   COVID-19 Vaccine (2 - Pfizer risk series) 10/31/2020   MAMMOGRAM  03/09/2021   INFLUENZA VACCINE  04/15/2021    There are no preventive care reminders to display for this patient.   Lab Results  Component Value Date   TSH 0.674 10/29/2020   Lab Results  Component Value Date   WBC 8.3 04/30/2021   HGB 13.9 04/30/2021   HCT 42.4 04/30/2021   MCV 100 (H) 04/30/2021   PLT 297 04/30/2021   Lab Results  Component Value Date   NA 142 04/30/2021   K 4.2 04/30/2021   CO2 21 04/30/2021   GLUCOSE 72 04/30/2021   BUN 15 04/30/2021   CREATININE 0.90 04/30/2021   BILITOT <0.2 04/30/2021   ALKPHOS 91  04/30/2021   AST 27 04/30/2021   ALT 18 04/30/2021   PROT 6.4 04/30/2021   ALBUMIN 4.3 04/30/2021   CALCIUM 9.1 04/30/2021   ANIONGAP 11 01/11/2018   EGFR 74 04/30/2021   Lab Results  Component Value Date   CHOL 217 (H) 04/30/2021   Lab Results  Component Value Date   HDL 54 04/30/2021   Lab Results  Component Value Date   LDLCALC 143 (H) 04/30/2021   Lab Results  Component Value Date   TRIG 111 04/30/2021   No results found for: CHOLHDL No results found for: HGBA1C     Assessment & Plan:   Problem List Items Addressed This Visit   None Visit Diagnoses     Allergic contact dermatitis due to dyes    -  Primary   Allergic dermatitis after hair dye. Will treat with prednisone taper since it is throughout scalp. Continue antihistamine, flonase. F/U if not improving.         Meds ordered this encounter  Medications   predniSONE (DELTASONE) 10 MG tablet    Sig: Take 6 tablets today, then 5 tablets tomorrow, then decrease by 1 tablet every day until gone    Dispense:  21 tablet    Refill:  0     Charyl Dancer, NP

## 2021-09-06 NOTE — Telephone Encounter (Signed)
Noted  

## 2021-09-16 NOTE — Unmapped (Signed)
REASON FOR VISIT: F/U PsA    HISTORY: Michele Miller is a 61 y.o. female with hx of psoriatic arthritis, chronic DDD, and centralized chronic pain. Was following pain management but has lost f/u due to transportation issues, recently referred but not yet seen again.  Psoriatic arthritis treated with mtx SQ monotherapy, initially, following closely with derm due to desquamative rash and now on Humira (started 5/21) and acitretin with skin improvement. Also on pharmacotherapy for fibromyalgia from PCP. Recently seeing dermatology more frequently due to severe involvement of her hands.  Intolerant of [place] due to mental health issues. ACDF 01/26/24with Dr. Jaynie Collins. Son died in 03/08/2014, mother has breast cancer and patient is primary caregiver.    Last visit: 07/12/20 via telemedicine with Dr. Delton See. At this visit joint symptoms seemed likely related to OA. On Humira and MTX per Dermatology. Pt has been lost to f/u since this.     Interim history:  Pt presents for f/u today. She reports some R shoulder pain which she saw Ortho about recently. She had steroid injection and PT which helped.     Today she reports some R knee and hip pain, mostly with walking. She denies recent joint swelling. She reports some AM stiffness but mostly to neck, following previous surgery.     She reports her rash as well controlled on Humira every other week and MTX. She has appointment with Dermatology next week.     She denies recent fever. She did have COVID 1 and half ago. She had paxlovid for this and mild symptoms overall.     CURRENT MEDICATIONS:  Current Outpatient Medications   Medication Sig Dispense Refill   ??? acitretin (SORIATANE) 25 MG capsule Take tablet every other day 30 capsule 0   ??? adalimumab 80 mg/0.8 mL-40 mg/0.4 mL PnKt Inject the contents of 1 pen (80mg ) under the skin once for initial dose, then inject the contents of 1 pen (40mg ) every other week beginning 1 week after initial dose. 3 each 0   ??? albuterol HFA 90 mcg/actuation inhaler Inhale 2 puffs by mouth every six (6) hours as needed for wheezing. 54 g 6   ??? cetirizine (ZYRTEC) 10 MG tablet Take 1 tablet (10 mg total) by mouth daily. 90 tablet 3   ??? clonazePAM (KLONOPIN) 0.5 MG tablet Take 0.5 mg by mouth Three (3) times a day.     ??? clonazePAM (KLONOPIN) 1 MG tablet Take 1 mg by mouth Three (3) times a day.     ??? DULoxetine (CYMBALTA) 60 MG capsule Take 2 capsules (120 mg total) by mouth daily. 60 capsule 3   ??? empty container (BD SHARPS COLLECTOR) Misc Use as directed. 1 each 3   ??? fluticasone propionate (FLONASE) 50 mcg/actuation nasal spray Use 2 sprays in each nostril daily. 16 g 4   ??? gabapentin (NEURONTIN) 300 MG capsule TAKE 1 CAPSULE(300 MG) BY MOUTH THREE TIMES DAILY 270 capsule 0   ??? HUMIRA PEN CITRATE FREE 40 MG/0.4 ML Inject the contents of 1 pen (40mg ) under the skin every 14 days 2 each 0   ??? loratadine (CLARITIN) 10 mg tablet      ??? meclizine (ANTIVERT) 12.5 mg tablet Take 12.5 mg by mouth Three (3) times a day as needed.      ??? methocarbamoL (ROBAXIN) 500 MG tablet Take 1 tablet (500 mg total) by mouth Three (3) times a day as needed. 60 tablet 3   ??? methotrexate 25 mg/mL injection solution  Inject 1 mL (25mg ) under the skin once a week 4 mL 0   ??? multivitamin with minerals tablet Take 1 tablet by mouth daily.     ??? senna (SENOKOT) 8.6 mg tablet Take 1 tablet by mouth daily as needed for constipation. take 1 pill as needed for cosntipation may increase to 2 tabs if no relief     ??? syringe (BD TUBERCULIN SYRINGE) 1 mL 27 x 1/2 Syrg Use as directed for weekly methotrexate injections. 100 each PRN   ??? tazarotene (AVAGE) 0.1 % cream Mix with clobetasol and apply twice daily to rash on hands 60 g 5   ??? tiotropium bromide (SPIRIVA RESPIMAT) 1.25 mcg/actuation Mist Inhale 2 puffs (2.5 mcg) BY MOUTH daily. 4 g 5   ??? tiotropium-olodateroL (STIOLTO RESPIMAT) 2.5-2.5 mcg/actuation Mist Inhale 2 puffs daily.     ??? traZODone (DESYREL) 50 MG tablet Take 50 mg by mouth nightly. takes 2 tabs      ??? VYVANSE 30 mg capsule        No current facility-administered medications for this visit.       Past Medical History:   Diagnosis Date   ??? Aortic atherosclerosis (CMS-HCC)    ??? Baker's cyst    ??? COPD (chronic obstructive pulmonary disease) (CMS-HCC)    ??? CTS (carpal tunnel syndrome)    ??? Depression    ??? Disorder of skin or subcutaneous tissue    ??? Headache(784.0)    ??? HLD (hyperlipidemia)    ??? Joint pain    ??? Myelomalacia of cervical cord (CMS-HCC) 09/22/2019   ??? Psoriasis    ??? Psoriatic arthritis (CMS-HCC)    ??? PTSD (post-traumatic stress disorder)    ??? Scoliosis    ??? Tobacco use         Record Review: Available records were reviewed, including pertinent office visits, labs, and imaging.      REVIEW OF SYSTEMS: Ten system were reviewed and negative except as noted above.    PHYSICAL EXAM:  VITAL SIGNS:   Vitals:    09/18/21 1232   BP: 120/90   BP Site: L Arm   BP Position: Sitting   BP Cuff Size: Medium   Pulse: 81   Temp: 36.8 ??C (98.2 ??F)   TempSrc: Temporal   Weight: 63.1 kg (139 lb 3.2 oz)   Height: 162.6 cm (5' 4)     General:   Pleasant 61 y.o.female in no acute distress, WDWN   Eyes:   PERRL, conjunctiva and sclera not inflamed. Tears appear adequate.    ENT:   No oropharyngeal lesions. Mucous membranes moist.    Lymph:   No masses or cervical lymphadenopathy.    Cardiovascular:  Regular rate and rhythm. No murmur, rub, or gallop. No lower extremity edema.    Lungs:  Clear to auscultation.Normal respiratory effort.    Musculoskeletal:   General: Ambulates w/o assistance   Hands: No swelling. Diffusely tender. Able to make a tight fist b/l   Wrists:FROM w/o swelling or tenderness   Elbows: FROM w/o swelling. Tender b/l to medial epicondyle.   Shoulders: FROM w/o pain   Hips: FROM w/o pain. Mild tenderness to b/l lateral hip. No pain with FABER.  Knees: FROM w/o effusions. Crepitus b/l.   Ankles: No swelling or tenderness   Feet: No pain with MTP squeeze  Occiput to wall:0cm   Neurological: CN 2-12 grossly intact. 5/5 strength on extremities.   Psych:  Appropriate affect and mood   Skin:  No rashes.         ASSESSMENT/PLAN:    H/O PsA- Overall stable. No active synovitis on exam today. Reporting joint pain which is largely mechanical. Previous notes show much of her joint pain has thought to be related to OA or fibromyalgia, no evidence of PsA in recent years though has not been seen in person since 2019.   - Continue MTX 25mg  [place] weekly for now. Refilled today.   - Update medication monitoring labs: CBCw/diff, Crt, AST, ALT  - Continue to follow with Dermatology who are managing Humira and acitretin  - Update hand, foot and SI joint xrays. Previous xrays showed no signs of inflammatory arthritis.     HCM:   - PCV13 Status: didn't discuss today  - PPSV 23 Status: 11/03/2013  - COVID-19 vaccine status:   - Annual Influenza vaccine. Status: pt declined  - COVID vaccine: pt declined  - Bone health: not on prednisone   - Contraception:hysterectomy    F/U in 6 months    I personally spent 31 minutes face-to-face and non-face-to-face in the care of this patient, which includes all pre, intra, and post visit time on the date of service.  All documented time was specific to the E/M visit and does not include any procedures that may have been performed.

## 2021-09-18 ENCOUNTER — Ambulatory Visit: Admit: 2021-09-18 | Discharge: 2021-09-19 | Payer: MEDICARE | Attending: Family | Primary: Family

## 2021-09-18 DIAGNOSIS — Z79631 Methotrexate, long term, current use: Principal | ICD-10-CM

## 2021-09-18 DIAGNOSIS — L4050 Arthropathic psoriasis, unspecified: Principal | ICD-10-CM

## 2021-09-18 DIAGNOSIS — Z23 Encounter for immunization: Principal | ICD-10-CM

## 2021-09-18 LAB — CBC W/ AUTO DIFF
BASOPHILS ABSOLUTE COUNT: 0.1 10*9/L (ref 0.0–0.1)
BASOPHILS RELATIVE PERCENT: 0.9 %
EOSINOPHILS ABSOLUTE COUNT: 0.1 10*9/L (ref 0.0–0.5)
EOSINOPHILS RELATIVE PERCENT: 1.9 %
HEMATOCRIT: 41.7 % (ref 34.0–44.0)
HEMOGLOBIN: 13.9 g/dL (ref 11.3–14.9)
LYMPHOCYTES ABSOLUTE COUNT: 2.2 10*9/L (ref 1.1–3.6)
LYMPHOCYTES RELATIVE PERCENT: 30.5 %
MEAN CORPUSCULAR HEMOGLOBIN CONC: 33.4 g/dL (ref 32.0–36.0)
MEAN CORPUSCULAR HEMOGLOBIN: 33.1 pg — ABNORMAL HIGH (ref 25.9–32.4)
MEAN CORPUSCULAR VOLUME: 99 fL — ABNORMAL HIGH (ref 77.6–95.7)
MEAN PLATELET VOLUME: 8.2 fL (ref 6.8–10.7)
MONOCYTES ABSOLUTE COUNT: 0.2 10*9/L — ABNORMAL LOW (ref 0.3–0.8)
MONOCYTES RELATIVE PERCENT: 2.9 %
NEUTROPHILS ABSOLUTE COUNT: 4.6 10*9/L (ref 1.8–7.8)
NEUTROPHILS RELATIVE PERCENT: 63.8 %
NUCLEATED RED BLOOD CELLS: 0 /100{WBCs} (ref <=4)
PLATELET COUNT: 283 10*9/L (ref 150–450)
RED BLOOD CELL COUNT: 4.21 10*12/L (ref 3.95–5.13)
RED CELL DISTRIBUTION WIDTH: 15.9 % — ABNORMAL HIGH (ref 12.2–15.2)
WBC ADJUSTED: 7.2 10*9/L (ref 3.6–11.2)

## 2021-09-18 LAB — ALT: ALT (SGPT): 12 U/L (ref 10–49)

## 2021-09-18 LAB — AST: AST (SGOT): 16 U/L (ref <=34)

## 2021-09-18 LAB — CREATININE
CREATININE: 0.68 mg/dL
EGFR CKD-EPI (2021) FEMALE: >90 mL/min/{1.73_m2} (ref >=60)

## 2021-09-18 MED ORDER — BD TUBERCULIN SYRINGE 1 ML 27 X 1/2"
PRN refills | 0 days | Status: CP
Start: 2021-09-18 — End: ?
  Filled 2021-09-25: qty 4, 28d supply, fill #0

## 2021-09-18 MED ORDER — METHOTREXATE SODIUM 25 MG/ML INJECTION SOLUTION
SUBCUTANEOUS | 0 refills | 84.00000 days | Status: CP
Start: 2021-09-18 — End: 2021-12-17

## 2021-09-18 NOTE — Unmapped (Signed)
Please make sure to see Dermatology as scheduled next week. Remember to complete xrays this week or next week, the orders will be in waiting for you. You don't need an appointment to complete these xrays just come into [facility] M-F 8-4 and inform front dest you are completing xrays.

## 2021-09-19 DIAGNOSIS — L4050 Arthropathic psoriasis, unspecified: Principal | ICD-10-CM

## 2021-09-19 DIAGNOSIS — L409 Psoriasis, unspecified: Principal | ICD-10-CM

## 2021-09-19 MED ORDER — HUMIRA PEN CITRATE FREE 40 MG/0.4 ML
SUBCUTANEOUS | 0 refills | 28.00000 days | Status: CP
Start: 2021-09-19 — End: 2021-10-19

## 2021-09-19 MED ORDER — FLUTICASONE PROPIONATE 50 MCG/ACTUATION NASAL SPRAY,SUSPENSION
Freq: Every day | NASAL | 4 refills | 61.00000 days | Status: CN
Start: 2021-09-19 — End: 2022-09-19

## 2021-09-19 MED ORDER — FOLIC ACID 1 MG TABLET
ORAL_TABLET | Freq: Every day | ORAL | 3 refills | 90.00000 days | Status: CP
Start: 2021-09-19 — End: 2022-09-19
  Filled 2021-09-25: qty 90, 90d supply, fill #0

## 2021-09-19 NOTE — Unmapped (Signed)
Folic acid refill  Last ov: 09/18/2021  Next ov: 03/24/2022

## 2021-09-19 NOTE — Unmapped (Signed)
[facility] Clinical Assessment & Refill Coordination Note    Michele Miller, DOB: 1960/11/13  Phone: [phone] (home)     All above HIPAA information was verified with patient.     Was a Nurse, learning disability used for this call? No    Specialty Medication(s):   Inflammatory Disorders: Humira and methotrexate (injectable)     Current Outpatient Medications   Medication Sig Dispense Refill   ??? acitretin (SORIATANE) 25 MG capsule Take tablet every other day 30 capsule 0   ??? adalimumab 80 mg/0.8 mL-40 mg/0.4 mL PnKt Inject the contents of 1 pen (80mg ) under the skin once for initial dose, then inject the contents of 1 pen (40mg ) every other week beginning 1 week after initial dose. 3 each 0   ??? albuterol HFA 90 mcg/actuation inhaler Inhale 2 puffs by mouth every six (6) hours as needed for wheezing. 54 g 6   ??? cetirizine (ZYRTEC) 10 MG tablet Take 1 tablet (10 mg total) by mouth daily. 90 tablet 3   ??? clonazePAM (KLONOPIN) 0.5 MG tablet Take 0.5 mg by mouth Three (3) times a day.     ??? clonazePAM (KLONOPIN) 1 MG tablet Take 1 mg by mouth Three (3) times a day.     ??? DULoxetine (CYMBALTA) 60 MG capsule Take 2 capsules (120 mg total) by mouth daily. 60 capsule 3   ??? empty container (BD SHARPS COLLECTOR) Misc Use as directed. 1 each 3   ??? fluticasone propionate (FLONASE) 50 mcg/actuation nasal spray Use 2 sprays in each nostril daily. 16 g 4   ??? gabapentin (NEURONTIN) 300 MG capsule TAKE 1 CAPSULE(300 MG) BY MOUTH THREE TIMES DAILY 270 capsule 0   ??? HUMIRA PEN CITRATE FREE 40 MG/0.4 ML Inject the contents of 1 pen (40mg ) under the skin every 14 days 2 each 0   ??? loratadine (CLARITIN) 10 mg tablet      ??? meclizine (ANTIVERT) 12.5 mg tablet Take 12.5 mg by mouth Three (3) times a day as needed.      ??? methocarbamoL (ROBAXIN) 500 MG tablet Take 1 tablet (500 mg total) by mouth Three (3) times a day as needed. 60 tablet 3   ??? methotrexate 25 mg/mL injection solution Inject 1 mL (25mg ) under the skin once a week 12 mL 0   ??? multivitamin with minerals tablet Take 1 tablet by mouth daily.     ??? senna (SENOKOT) 8.6 mg tablet Take 1 tablet by mouth daily as needed for constipation. take 1 pill as needed for cosntipation may increase to 2 tabs if no relief     ??? syringe (BD TUBERCULIN SYRINGE) 1 mL 27 x 1/2 Syrg Use as directed for weekly methotrexate injections. 100 each PRN   ??? tazarotene (AVAGE) 0.1 % cream Mix with clobetasol and apply twice daily to rash on hands 60 g 5   ??? tiotropium bromide (SPIRIVA RESPIMAT) 1.25 mcg/actuation Mist Inhale 2 puffs (2.5 mcg) BY MOUTH daily. 4 g 5   ??? tiotropium-olodateroL (STIOLTO RESPIMAT) 2.5-2.5 mcg/actuation Mist Inhale 2 puffs daily.     ??? traZODone (DESYREL) 50 MG tablet Take 50 mg by mouth nightly. takes 2 tabs      ??? VYVANSE 30 mg capsule        No current facility-administered medications for this visit.        Changes to medications: [name] reports no changes at this time.    No Known Allergies    Changes to  allergies: No    SPECIALTY MEDICATION ADHERENCE         Medication Adherence    Patient reported X missed doses in the last month: 0  Specialty Medication: methotrexate q week  Patient is on additional specialty medications: Yes  Additional Specialty Medications: Humira q 14d   Patient is on more than two specialty medications: No  Informant: patient          Specialty medication(s) dose(s) confirmed: Regimen is correct and unchanged.     Are there any concerns with adherence? No    Adherence counseling provided? Not needed    CLINICAL MANAGEMENT AND INTERVENTION      Clinical Benefit Assessment:    Do you feel the medicine is effective or helping your condition? Yes    Clinical Benefit counseling provided? Progress note from 09/18/21 shows evidence of clinical benefit    Adverse Effects Assessment:    Are you experiencing any side effects? No    Are you experiencing difficulty administering your medicine? No    Quality of Life Assessment:    Quality of Life    Rheumatology  Oncology  Dermatology  Cystic Fibrosis          How many days over the past month did your condition  keep you from your normal activities? For example, brushing your teeth or getting up in the morning. 0    Have you discussed this with your provider? Not needed    Acute Infection Status:    Acute infections noted within Epic:  No active infections  Patient reported infection: None    Therapy Appropriateness:    Is therapy appropriate and patient progressing towards therapeutic goals? Yes, therapy is appropriate and should be continued    DISEASE/MEDICATION-SPECIFIC INFORMATION      For patients on injectable medications: Patient currently has 1 doses left.  Next injection is scheduled for 1/6.  (1 injection of each medication on hand)     PATIENT SPECIFIC NEEDS     - Does the patient have any physical, cognitive, or cultural barriers? No    - Is the patient high risk? No    - Does the patient require a Care Management Plan? No     SOCIAL DETERMINANTS OF HEALTH     At the [facility], we have learned that life circumstances - like trouble affording food, housing, utilities, or transportation can affect the health of many of our patients.   That is why we wanted to ask: are you currently experiencing any life circumstances that are negatively impacting your health and/or quality of life? Patient declined to answer    Social Determinants of Health     Food Insecurity: Not on file   Tobacco Use: High Risk   ??? Smoking Tobacco Use: Every Day   ??? Smokeless Tobacco Use: Never   ??? Passive Exposure: Not on file   Transportation Needs: Not on file   Alcohol Use: Not on file   Housing/Utilities: Not on file   Substance Use: Not on file   Financial Resource Strain: Not on file   Physical Activity: Not on file   Health Literacy: Not on file   Stress: Not on file   Intimate Partner Violence: Not on file   Depression: Not on file   Social Connections: Not on file       Would you be willing to receive help with any of the needs that you have identified today? Not applicable  SHIPPING     Specialty Medication(s) to be Shipped:   Inflammatory Disorders: Humira and methotrexate (injectable)    Other medication(s) to be shipped: syringe, flonase, folic acid     Changes to insurance: No    Delivery Scheduled: Yes, Expected medication delivery date: 1/11.     Medication will be delivered via Same Day Courier to the confirmed prescription address in [facility].    The patient will receive a drug information handout for each medication shipped and additional FDA Medication Guides as required.  Verified that patient has previously received a Conservation officer, historic buildings and a Surveyor, mining.    The patient or caregiver noted above participated in the development of this care plan and knows that they can request review of or adjustments to the care plan at any time.      All of the patient's questions and concerns have been addressed.    Julianne Rice   [facility] Specialty Pharmacist

## 2021-09-23 ENCOUNTER — Ambulatory Visit
Admit: 2021-09-23 | Discharge: 2021-09-24 | Payer: MEDICARE | Attending: Student in an Organized Health Care Education/Training Program | Primary: Student in an Organized Health Care Education/Training Program

## 2021-09-23 DIAGNOSIS — Z79899 Other long term (current) drug therapy: Principal | ICD-10-CM

## 2021-09-23 DIAGNOSIS — L409 Psoriasis, unspecified: Principal | ICD-10-CM

## 2021-09-23 MED ORDER — HUMIRA PEN CITRATE FREE 40 MG/0.4 ML
SUBCUTANEOUS | 11 refills | 28.00000 days | Status: CP
Start: 2021-09-23 — End: 2021-10-23

## 2021-09-23 MED ORDER — ACITRETIN 25 MG CAPSULE
ORAL_CAPSULE | ORAL | 3 refills | 0.00000 days | Status: CP
Start: 2021-09-23 — End: ?

## 2021-09-23 MED ORDER — CLOBETASOL 0.05 % TOPICAL OINTMENT
Freq: Two times a day (BID) | TOPICAL | 1 refills | 0.00000 days | Status: CP
Start: 2021-09-23 — End: 2022-09-23

## 2021-09-23 NOTE — Unmapped (Signed)
Dermatology Note     Assessment and Plan:      Hand dermatitis in patient with hx of PsA- improved on MTX (25mg  weekly), acitretin (25mg  every other day), Humira- much improved  - Rheumatology prescribing methotrexate at 25 mg weekly for psoriatic arthritis; no prior history of cutaneous psoriasis  - DDx includes psoriasis (favored) versus allergic contact dermatitis vs other  - s/p several short courses of prednisone in the past  - Failed Otezla (GI side effects and headaches)  - Continue acitretin 25 mg every other day  - Currently using clobetasol ointment as needed, needs refills  - acitretin (SORIATANE) 25 MG capsule; Take tablet every other day  Dispense: 90 capsule; Refill: 3  - clobetasoL (TEMOVATE) 0.05 % ointment; Apply topically Two (2) times a day. To dry hands as needed  Dispense: 60 g; Refill: 1  - HUMIRA PEN CITRATE FREE 40 MG/0.4 ML; Inject 0.4 mL (40 mg total) under the skin every fourteen (14) days.  Dispense: 2 each; Refill: 11    High risk medication use: MTX, Adalimumab, Acitretin  - Quant Gold 5/21  - Labs 05/08/20 WNL; normal labs 07/2020  - Cholesterol, Total; Future  - Triglycerides; Future  - Quantiferon TB Gold Plus; Future    Xerosis Cutis  -The patient was advised to use a gentle cleanser such as Dove or Cetaphil and to avoid anti-bacterial soaps which may be too irritating. We recommended the frequent use of a greasy emollient such as Cetaphil, Cerave or Vaseline to moisturize the skin once to twice daily. We also recommended the avoidance of scratching and irritants to the skin.    History of NMSC  - No evidence of recurrence at prior treatment sites on exam today   - Discussed sun protection measures and recommendations given today  - Encouraged regular self-skin monitoring as well as scheduled clinical examinations for surveillance.    The patient was advised to call for an appointment should any new, changing, or symptomatic lesions develop.     RTC: Return in about 6 months (around 03/23/2022) for Recheck. or sooner as needed   _________________________________________________________________    Chief Complaint     Chief Complaint   Patient presents with   ??? Psoriasis     PSORIATIC ARTHRITIS-HANDS ARE DOING OK BUT JOINTS ARE SWOLEN-NO RASH     HPI     Michele Miller is a 61 y.o. female who presents as a returning patient (last seen 08/27/2020) to [facility] for follow up of hand dermatitis.  She states that her hands are much better, she is using acitretin 25 mg every other day, methotrexate 25 mg weekly, and Humira every other week.  She has run out of clobetasol and needs some refills.  She states that she would like to stay at this current dosage of her medicines.    The patient denies any other new or changing lesions or areas of concern.     Pertinent Past Medical History     History of Skin Cancer, see below:    Problem List        Musculoskeletal and Integument    Psoriasis    Relevant Medications    acitretin (SORIATANE) 25 MG capsule    clobetasoL (TEMOVATE) 0.05 % ointment    HUMIRA PEN CITRATE FREE 40 MG/0.4 ML     Family History:   Unknown     Past Medical History, Family History, Social History, Medication List, Allergies, and Problem List were reviewed in  the rooming section of Epic.     ROS: Other than symptoms mentioned in the HPI, no fevers, chills, or other skin complaints    Physical Examination     GENERAL: Well-appearing female in no acute distress, resting comfortably.  NEURO: Alert and oriented, answers questions appropriately  SKIN (Focal Skin Exam): Per patient request, examination of hands, digits bilaterally, back, chest, face  was performed was performed  - Xerosis of trunk  - Mild xerosis of palmar surfaces, with distal digital tips    All areas not commented on are within normal limits or unremarkable      (Approved Template 04/25/2020)

## 2021-09-23 NOTE — Unmapped (Signed)
Today your resident physician was: Dr. Midori Dado

## 2021-09-25 ENCOUNTER — Other Ambulatory Visit: Admit: 2021-09-25 | Discharge: 2021-09-26 | Payer: MEDICARE

## 2021-09-25 ENCOUNTER — Ambulatory Visit: Admit: 2021-09-25 | Discharge: 2021-09-26 | Payer: MEDICARE

## 2021-09-25 DIAGNOSIS — L409 Psoriasis, unspecified: Principal | ICD-10-CM

## 2021-09-25 DIAGNOSIS — Z79899 Other long term (current) drug therapy: Principal | ICD-10-CM

## 2021-09-25 LAB — CHOLESTEROL, TOTAL: CHOLESTEROL: 183 mg/dL (ref <=200)

## 2021-09-25 LAB — TRIGLYCERIDES: TRIGLYCERIDES: 157 mg/dL — ABNORMAL HIGH (ref 0–150)

## 2021-09-25 MED ORDER — HUMIRA PEN CITRATE FREE 40 MG/0.4 ML
SUBCUTANEOUS | 0 refills | 28.00000 days | Status: CP
Start: 2021-09-25 — End: 2021-10-25
  Filled 2021-09-25: qty 2, 28d supply, fill #0

## 2021-09-25 MED FILL — METHOTREXATE SODIUM 25 MG/ML INJECTION SOLUTION: SUBCUTANEOUS | 28 days supply | Qty: 4 | Fill #0

## 2021-09-25 NOTE — Unmapped (Signed)
PA initiated for Acitretin 25 mg capsule via CMM.  Key: [id number]

## 2021-09-26 LAB — TB AG2: TB AG2 VALUE: 0.02

## 2021-09-26 LAB — QUANTIFERON TB GOLD PLUS
QUANTIFERON ANTIGEN 1 MINUS NIL: 0 [IU]/mL
QUANTIFERON ANTIGEN 2 MINUS NIL: 0.02 [IU]/mL
QUANTIFERON MITOGEN: 4.48 [IU]/mL
QUANTIFERON TB GOLD PLUS: NEGATIVE
QUANTIFERON TB NIL VALUE: 0 [IU]/mL

## 2021-09-26 LAB — TB MITOGEN: TB MITOGEN VALUE: 4.48

## 2021-09-26 LAB — TB NIL: TB NIL VALUE: 0

## 2021-09-26 LAB — TB AG1: TB AG1 VALUE: 0

## 2021-09-26 NOTE — Unmapped (Signed)
Result Note: Michele Miller Results negative.     Tracking:No.  Patient notified: Michele Miller

## 2021-09-26 NOTE — Unmapped (Signed)
Message from plan -     This medication or product is on your plan's list of covered drugs. Prior authorization is not required at this time. If your pharmacy has questions regarding the processing of your prescription, please have them call the [facility] pharmacy help desk at [phone]. **Please note: This request was submitted electronically. Formulary lowering, tiering exception, cost reduction and/or pre-benefit determination review (including prospective Medicare hospice reviews) requests cannot be requested using this method of submission. Providers contact us at [phone] for further assistance.

## 2021-10-18 DIAGNOSIS — L409 Psoriasis, unspecified: Principal | ICD-10-CM

## 2021-10-18 MED ORDER — HUMIRA PEN CITRATE FREE 40 MG/0.4 ML
SUBCUTANEOUS | 11 refills | 28.00000 days
Start: 2021-10-18 — End: 2021-11-17

## 2021-10-18 MED ORDER — FLUTICASONE PROPIONATE 50 MCG/ACTUATION NASAL SPRAY,SUSPENSION
Freq: Every day | NASAL | 4 refills | 61.00000 days | Status: CN
Start: 2021-10-18 — End: 2022-10-18

## 2021-10-19 NOTE — Unmapped (Signed)
Refill request for Humira.    LOV:09/23/2021    There is an additional prescription for Humira in the med list; however, it was sent to: [facility] [place], [place] - [address]   [address], [place] [address]     Humira rx pended to [facility].

## 2021-10-19 NOTE — Unmapped (Signed)
[facility] Refill Coordination Note    Specialty Medication(s) to be Shipped:   Inflammatory Disorders: Humira and methotrexate (injectable)    Other medication(s) to be shipped:     flonase  Syringe       Michele Miller, DOB: 04/26/1961  Phone: [phone] (home)       All above HIPAA information was verified with patient.     Was a Nurse, learning disability used for this call? No    Completed refill call assessment today to schedule patient's medication shipment from the [facility] [phone]).  All relevant notes have been reviewed.     Specialty medication(s) and dose(s) confirmed: Regimen is correct and unchanged.   Changes to medications: Michele Miller reports no changes at this time.  Changes to insurance: No  New side effects reported not previously addressed with a pharmacist or physician: None reported  Questions for the pharmacist: No    Confirmed patient received a Conservation officer, historic buildings and a Surveyor, mining with first shipment. The patient will receive a drug information handout for each medication shipped and additional FDA Medication Guides as required.       DISEASE/MEDICATION-SPECIFIC INFORMATION        For patients on injectable medications: Patient currently has 0 doses left.  Next injection is scheduled for 10/25/21.    SPECIALTY MEDICATION ADHERENCE     Medication Adherence    Patient reported X missed doses in the last month: 0  Specialty Medication: HUMIRA(CF) PEN 40 mg/0.4 mL injection (adalimumab)  Patient is on additional specialty medications: Yes  Additional Specialty Medications: methotrexate 25 mg/mL injection solution  Patient Reported Additional Medication X Missed Doses in the Last Month: 0  Patient is on more than two specialty medications: No              Were doses missed due to medication being on hold? No      REFERRAL TO PHARMACIST     Referral to the pharmacist: Not needed      [facility]     Shipping address confirmed in Epic.     Delivery Scheduled: Yes, Expected medication delivery date: 10/22/21.     Medication will be delivered via Same Day Courier to the prescription address in Epic WAM.    Michele Miller   [facility] Specialty Technician

## 2021-10-21 ENCOUNTER — Encounter: Payer: Self-pay | Admitting: Nurse Practitioner

## 2021-10-21 MED ORDER — HUMIRA PEN CITRATE FREE 40 MG/0.4 ML
SUBCUTANEOUS | 11 refills | 28.00000 days | Status: CP
Start: 2021-10-21 — End: 2021-11-20
  Filled 2021-10-22: qty 2, 28d supply, fill #0

## 2021-10-22 MED ORDER — FLUTICASONE PROPIONATE 50 MCG/ACT NA SUSP: 2.0000 | Freq: Every day | NASAL | 4 refills | Status: DC

## 2021-10-22 MED FILL — METHOTREXATE SODIUM 25 MG/ML INJECTION SOLUTION: SUBCUTANEOUS | 28 days supply | Qty: 4 | Fill #1

## 2021-10-22 MED FILL — BD TUBERCULIN SYRINGE 1 ML 27 X 1/2": 28 days supply | Qty: 4 | Fill #1

## 2021-11-01 ENCOUNTER — Ambulatory Visit: Payer: Medicare Other | Admitting: Nurse Practitioner

## 2021-11-01 ENCOUNTER — Encounter: Payer: Self-pay | Admitting: Nurse Practitioner

## 2021-11-01 ENCOUNTER — Other Ambulatory Visit: Payer: Self-pay

## 2021-11-01 ENCOUNTER — Ambulatory Visit (INDEPENDENT_AMBULATORY_CARE_PROVIDER_SITE_OTHER): Payer: 59 | Admitting: Nurse Practitioner

## 2021-11-01 VITALS — BP 104/72 | HR 78 | Temp 98.5°F | Ht 63.75 in | Wt 146.2 lb

## 2021-11-01 DIAGNOSIS — L4050 Arthropathic psoriasis, unspecified: Secondary | ICD-10-CM

## 2021-11-01 DIAGNOSIS — Z0000 Encounter for general adult medical examination without abnormal findings: Secondary | ICD-10-CM | POA: Diagnosis not present

## 2021-11-01 DIAGNOSIS — Z803 Family history of malignant neoplasm of breast: Secondary | ICD-10-CM

## 2021-11-01 DIAGNOSIS — Z1231 Encounter for screening mammogram for malignant neoplasm of breast: Secondary | ICD-10-CM | POA: Diagnosis not present

## 2021-11-01 DIAGNOSIS — I700 Atherosclerosis of aorta: Secondary | ICD-10-CM

## 2021-11-01 DIAGNOSIS — J432 Centrilobular emphysema: Secondary | ICD-10-CM

## 2021-11-01 DIAGNOSIS — Z1211 Encounter for screening for malignant neoplasm of colon: Secondary | ICD-10-CM | POA: Diagnosis not present

## 2021-11-01 DIAGNOSIS — F17219 Nicotine dependence, cigarettes, with unspecified nicotine-induced disorders: Secondary | ICD-10-CM

## 2021-11-01 DIAGNOSIS — K5904 Chronic idiopathic constipation: Secondary | ICD-10-CM

## 2021-11-01 DIAGNOSIS — F332 Major depressive disorder, recurrent severe without psychotic features: Secondary | ICD-10-CM

## 2021-11-01 DIAGNOSIS — E559 Vitamin D deficiency, unspecified: Secondary | ICD-10-CM

## 2021-11-01 DIAGNOSIS — E7800 Pure hypercholesterolemia, unspecified: Secondary | ICD-10-CM

## 2021-11-01 DIAGNOSIS — F4310 Post-traumatic stress disorder, unspecified: Secondary | ICD-10-CM

## 2021-11-01 MED ORDER — PREDNISONE 20 MG PO TABS: 40.0000 mg | ORAL_TABLET | Freq: Every day | ORAL | 0 refills | Status: AC

## 2021-11-01 NOTE — Assessment & Plan Note (Signed)
Chronic, ongoing.  Continue current medication regimen and collaboration with psychiatry + therapy sessions, which patient has reported they are helping.  Denies SI/HI.

## 2021-11-01 NOTE — Assessment & Plan Note (Signed)
PCP discussed at length with patient and recommended complete cessation of smoking and educated patient on risk with smoking. Patient refuses at this time. Consider addition of statin and ASA in future.

## 2021-11-01 NOTE — Assessment & Plan Note (Signed)
Lipid panel ordered today

## 2021-11-01 NOTE — Patient Instructions (Signed)
COPD and Physical Activity Chronic obstructive pulmonary disease (COPD) is a long-term, or chronic, condition that affects the lungs. COPD is a general term that can be used to describe many problems that cause inflammation of the lungs and limit airflow. These conditions include chronic bronchitis and emphysema. The main symptom of COPD is shortness of breath, which makes it harder to do even simple tasks. This can also make it harder to exercise and stay active. Talk with your health care provider about treatments to help you breathe better and actions you can take to prevent breathing problems during physical activity. What are the benefits of exercising when you have COPD? Exercising regularly is an important part of a healthy lifestyle. You can still exercise and do physical activities even though you have COPD. Exercise and physical activity improve your shortness of breath by increasing blood flow (circulation). This causes your heart to pump more oxygen through your body. Moderate exercise can: Improve oxygen use. Increase your energy level. Help with shortness of breath. Strengthen your breathing muscles. Improve heart health. Help with sleep. Improve your self-esteem and feelings of self-worth. Lower depression, stress, and anxiety. Exercise can benefit everyone with COPD. The severity of your disease may affect how hard you can exercise, especially at first, but everyone can benefit. Talk with your health care provider about how much exercise is safe for you, and which activities and exercises are safe for you. What actions can I take to prevent breathing problems during physical activity? Sign up for a pulmonary rehabilitation program. This type of program may include: Education about lung diseases. Exercise classes that teach you how to exercise and be more active while improving your breathing. This usually involves: Exercise using your lower extremities, such as a stationary  bicycle. About 30 minutes of exercise, 2 to 5 times per week, for 6 to 12 weeks. Strength training, such as push-ups or leg lifts. Nutrition education. Group classes in which you can talk with others who also have COPD and learn ways to manage stress. If you use an oxygen tank, you should use it while you exercise. Work with your health care provider to adjust your oxygen for your physical activity. Your resting flow rate is different from your flow rate during physical activity. How to manage your breathing while exercising While you are exercising: Take slow breaths. Pace yourself, and do nottry to go too fast. Purse your lips while breathing out. Pursing your lips is similar to a kissing or whistling position. If doing exercise that uses a quick burst of effort, such as weight lifting: Breathe in before starting the exercise. Breathe out during the hardest part of the exercise, such as raising the weights. Where to find support You can find support for exercising with COPD from: Your health care provider. A pulmonary rehabilitation program. Your local health department or community health programs. Support groups, either online or in-person. Your health care provider may be able to recommend support groups. Where to find more information You can find more information about exercising with COPD from: American Lung Association: lung.org COPD Foundation: copdfoundation.org Contact a health care provider if: Your symptoms get worse. You have nausea. You have a fever. You want to start a new exercise program or a new activity. Get help right away if: You have chest pain. You cannot breathe. These symptoms may represent a serious problem that is an emergency. Do not wait to see if the symptoms will go away. Get medical help right away. Call   your local emergency services (911 in the [place] Do not drive yourself to the hospital. Summary COPD is a general term that can be used to describe  many different lung problems that cause lung inflammation and limit airflow. This includes chronic bronchitis and emphysema. Exercise and physical activity improve your shortness of breath by increasing blood flow (circulation). This causes your heart to provide more oxygen to your body. Contact your health care provider before starting any exercise program or new activity. Ask your health care provider what exercises and activities are safe for you. This information is not intended to replace advice given to you by your health care provider. Make sure you discuss any questions you have with your health care provider. Document Revised: 07/10/2020 Document Reviewed: 07/10/2020 Elsevier Patient Education  2022 [employer]vier Inc.  

## 2021-11-01 NOTE — Assessment & Plan Note (Signed)
Chronic, ongoing.  Continue current medication regimen and collaboration with psychiatry and therapist. Denies SI/HI.  She is aware all psychiatric medications are to be filled by her psychiatrist and she needs to ensure continued care with them.  Return in 6 months.

## 2021-11-01 NOTE — Assessment & Plan Note (Signed)
Continue collaboration with rheumatology & dermatology at [facility] and current medication regimen.  Reviewed recent notes.  Will obtain CMP today, CBC was recently obtained.

## 2021-11-01 NOTE — Assessment & Plan Note (Signed)
States she has been having periods of constipation and takes senna daily which is helping with bowel movement. Continue with current regimen. Notify provider if worsens.

## 2021-11-01 NOTE — Progress Notes (Signed)
BP 104/72    Pulse 78    Temp 98.5 F (36.9 C) (Oral)    Ht 5' 3.75" (1.619 m)    Wt 146 lb 3.2 oz (66.3 kg)    SpO2 96%    BMI 25.29 kg/m    Subjective:    Patient ID: Emily Johnston, female    DOB: 04/25/1961, 61 y.o.   MRN: [id number]  NOTE WRITTEN BY [employer] DNP STUDENT.  ASSESSMENT AND PLAN OF CARE REVIEWED WITH STUDENT, AGREE WITH ABOVE FINDINGS AND PLAN.   HPI: Emily Johnston is a 61 y.o. female presenting on 11/01/2021 for comprehensive medical examination. Current medical complaints include: -Sinus drainage -headache   She currently lives with: alone Menopausal Symptoms: no  Her mother has breast cancer and is under hospice care, patient has not gone for mammogram and has not completed Cologuard.  WEIGHT GAIN: States she gained 10 lbs "in a matter of days" and is unsure what is causing it. She have not made and changes to her diet or physical activities.  UPPER RESPIRATORY TRACT INFECTION Worst symptom: feels symptoms are improving Fever: no Cough: yes improving Shortness of breath: yes, with exertion Wheezing: no Chest pain: no Chest tightness: no Chest congestion: no Nasal congestion: yes Runny nose: nono Post nasal drip: no Sneezing: yes, a little  Sore throat: no Swollen glands: no Sinus pressure: no Headache: yes Face pain: no Toothache: no Ear pain: no  Ear pressure: no  Eyes red/itching:no Eye drainage/crusting: no  Vomiting: no Rash: no Fatigue: yes Sick contacts: no Strep contacts: no  Context: better Recurrent sinusitis: yes Relief with OTC cold/cough medications: yes  Treatments attempted: cold/sinus and anti-histamine    PSORIATIC ARTHRITIS: Followed by rheumatology at [facility], last seen 09/18/21, and dermatology last 09/23/21.  She continues on Methotrexate 25 MG weekly, Acitretin 25 MG every other day, and Humira + Clobetasol.  This regimen has offered some benefit.    COPD Continues to smoke -- is smoking less than 1 PPD,  has  been smoking since age 63. Continues on Stiolto daily and Albuterol as needed.  Had lung CT CA screening 06/25/20, which was benign, but did note aortic atherosclerosis and emphysema.   COPD status: stable Satisfied with current treatment?: yes Oxygen use: no Dyspnea frequency: none Cough frequency: occasional Rescue inhaler frequency:  have not used it in the past month Limitation of activity: no Productive cough: none Last Spirometry: 03/22/2019 Pneumovax: Up to Date Influenza: Up to Date    FIBROMYALGIA Continues on Duloxetine.   Pain status: stable Satisfied with current treatment?: yes Previous pain specialty evaluation: no Non-narcotic analgesic meds: yes Narcotic contract:no Treatments attempted: rest, APAP, ibuprofen and physical therapy   State she is not aware of this diagnosis   DEPRESSION/PTSD Currently followed by psychiatry team, [facility] in [place]. had to cancel last appointment, states she has been very busy taking care of her mother-.  Also seeing therapy at this location, who she has a good rapport.  Continues Vyvanse, Trazodone, Zyprexa, Cymbalta, and Klonopin. Mood status: stable (non improving) Satisfied with current treatment?: No Symptom severity: moderate Duration of current treatment : chronic Side effects: no Medication compliance: good compliance Psychotherapy/counseling: yes current Depressed mood: yes Anxious mood: yes Anhedonia: no Significant weight loss or gain: yes, gain 10 lbs Insomnia: yes hard to stay asleep Fatigue: no, states she has not energy Feelings of worthlessness or guilt: no Impaired concentration/indecisiveness: yes Suicidal ideations: no Hopelessness: once in a while Crying spells:  no Depression screen [facility] 2/9 11/01/2021 09/06/2021 09/03/2021 07/30/2021 04/30/2021  Decreased Interest 3 3 2 3 3   Down, Depressed, Hopeless 3 3 2 2 3   PHQ - 2 Score 6 6 4 5 6   Altered sleeping 3 1 2 3 2   Tired, decreased energy 3  2 2 3 2   Change in appetite 3 2 0 2 2  Feeling bad or failure about yourself  1 2 0 2 1  Trouble concentrating 3 3 0 2 3  Moving slowly or fidgety/restless 2 3 - 2 1  Suicidal thoughts 0 0 0 - 0  PHQ-9 Score 21 19 8 19 17   Difficult doing work/chores Somewhat difficult Very difficult Not difficult at all - Somewhat difficult  Some recent data might be hidden    The patient does not have a history of falls. I did not complete a risk assessment for falls. A plan of care for falls was not documented.  Past Medical History:  Past Medical History:  Diagnosis Date   Anxiety    Arthritis    COPD (chronic obstructive pulmonary disease) (HCC)    Depression    Fibromyalgia    IBS (irritable bowel syndrome)    PTSD (post-traumatic stress disorder)     Surgical History:  Past Surgical History:  Procedure Laterality Date   ABDOMINAL HYSTERECTOMY     CARPAL TUNNEL RELEASE     KNEE SURGERY      Medications:  Current Outpatient Medications on File Prior to Visit  Medication Sig   acitretin (SORIATANE) 25 MG capsule Take 1 capsule by mouth daily.   albuterol (VENTOLIN HFA) 108 (90 Base) MCG/ACT inhaler Inhale 2 puffs into the lungs every 6 (six) hours as needed for wheezing or shortness of breath.   clonazePAM (KLONOPIN) 1 MG tablet Take 1 mg by mouth as needed for anxiety. 3 times daily as needed   fluticasone (FLONASE) 50 MCG/ACT nasal spray Place 2 sprays into both nostrils daily.   folic acid (FOLVITE) 1 MG tablet Take 1 mg by mouth daily.   gabapentin (NEURONTIN) 300 MG capsule Take 300 mg by mouth 3 (three) times daily.   loratadine (CLARITIN) 10 MG tablet Take 1 tablet (10 mg total) by mouth daily.   meclizine (ANTIVERT) 12.5 MG tablet Take 1 tablet (12.5 mg total) by mouth 3 (three) times daily as needed for dizziness.   meloxicam (MOBIC) 7.5 MG tablet Mobic 7.5 mg tablet  Take 1 tablet every 12 hours by oral route.   methotrexate 250 MG/10ML injection Inject  25 mg into the vein once a week.   Multiple Vitamin (MULTIVITAMIN) tablet Take 1 tablet by mouth daily.   OLANZapine (ZYPREXA) 5 MG tablet Take 2.5 mg by mouth 2 (two) times daily.   senna (SENOKOT) 8.6 MG tablet Take 1 tablet by mouth as needed.   STIOLTO RESPIMAT 2.5-2.5 MCG/ACT AERS INHALE 2 PUFFS BY MOUTH INTO THE LUNGS ONCE DAILY   tiZANidine (ZANAFLEX) 4 MG tablet Take 1 tablet (4 mg total) by mouth every 6 (six) hours as needed for muscle spasms.   traZODone (DESYREL) 50 MG tablet Take 2 tablets by mouth daily.   VYVANSE 30 MG capsule Take 30 mg by mouth at bedtime.   Adalimumab (HUMIRA PEN) 40 MG/0.4ML PNKT Inject into the skin.   DULoxetine (CYMBALTA) 60 MG capsule Take 2 capsules by mouth daily.   No current facility-administered medications on file prior to visit.    Allergies:  No Known Allergies  Social History:  Social History   Socioeconomic History   Marital status: Legally Separated    Spouse name: Not on file   Number of children: Not on file   Years of education: Not on file   Highest education level: Not on file  Occupational History   Not on file  Tobacco Use   Smoking status: Every Day    Packs/day: 1.00    Years: 41.00    Pack years: 41.00    Types: Cigarettes   Smokeless tobacco: Never  Vaping Use   Vaping Use: Never used  Substance and Sexual Activity   Alcohol use: No   Drug use: No   Sexual activity: Not on file  Other Topics Concern   Not on file  Social History Narrative   Not on file   Social Determinants of Health   Financial Resource Strain: Low Risk    Difficulty of Paying Living Expenses: Not hard at all  Food Insecurity: No Food Insecurity   Worried About Charity fundraiser in the Last Year: Never true   Ran Out of Food in the Last Year: Never true  Transportation Needs: No Transportation Needs   Lack of Transportation (Medical): No   Lack of Transportation (Non-Medical): No  Physical Activity:  Insufficiently Active   Days of Exercise per Week: 3 days   Minutes of Exercise per Session: 30 min  Stress: No Stress Concern Present   Feeling of Stress : Not at all  Social Connections: Socially Isolated   Frequency of Communication with Friends and Family: Once a week   Frequency of Social Gatherings with Friends and Family: Twice a week   Attends Religious Services: Never   Marine scientist or Organizations: No   Attends Music therapist: Never   Marital Status: Separated  Intimate Partner Violence: Not At Risk   Fear of Current or Ex-Partner: No   Emotionally Abused: No   Physically Abused: No   Sexually Abused: No   Social History   Tobacco Use  Smoking Status Every Day   Packs/day: 1.00   Years: 41.00   Pack years: 41.00   Types: Cigarettes  Smokeless Tobacco Never   Social History   Substance and Sexual Activity  Alcohol Use No    Family History:  History reviewed. No pertinent family history.  Past medical history, surgical history, medications, allergies, family history and social history reviewed with patient today and changes made to appropriate areas of the chart.   Review of Systems  Constitutional:  Positive for malaise/fatigue. Negative for chills, diaphoresis, fever and weight loss.  HENT:  Negative for congestion, ear discharge, ear pain, sinus pain, sore throat and tinnitus.   Eyes:  Negative for blurred vision, double vision, photophobia and discharge.  Respiratory:  Positive for cough and sputum production. Negative for hemoptysis, shortness of breath and wheezing.   Cardiovascular:  Negative for chest pain, palpitations, orthopnea and leg swelling.  Gastrointestinal:  Positive for constipation. Negative for abdominal pain, blood in stool, diarrhea, heartburn, nausea and vomiting.  Genitourinary:  Negative for dysuria, frequency and urgency.  Musculoskeletal:  Negative for back pain, falls, joint pain and  myalgias.  Skin:  Negative for itching and rash.  Neurological:  Positive for headaches. Negative for dizziness, tingling, tremors, sensory change and weakness.  Psychiatric/Behavioral:  Negative for depression, memory loss and suicidal ideas. The patient is not nervous/anxious and does not have insomnia.    All other ROS negative except what is listed above  and in the HPI.      Objective:    BP 104/72    Pulse 78    Temp 98.5 F (36.9 C) (Oral)    Ht 5' 3.75" (1.619 m)    Wt 146 lb 3.2 oz (66.3 kg)    SpO2 96%    BMI 25.29 kg/m   Wt Readings from Last 3 Encounters:  11/01/21 146 lb 3.2 oz (66.3 kg)  09/06/21 138 lb 9.6 oz (62.9 kg)  07/30/21 136 lb 6 oz (61.9 kg)    Physical Exam Vitals and nursing note reviewed. Exam conducted with a chaperone present.  Constitutional:      General: She is awake. She is not in acute distress.    Appearance: Normal appearance. She is well-developed and well-groomed. She is not ill-appearing or toxic-appearing.  HENT:     Head: Normocephalic and atraumatic.     Salivary Glands: Right salivary gland is not diffusely enlarged or tender. Left salivary gland is not diffusely enlarged or tender.     Right Ear: Hearing, tympanic membrane, ear canal and external ear normal. No drainage.     Left Ear: Hearing, tympanic membrane, ear canal and external ear normal. No drainage.     Nose: Nose normal. No rhinorrhea.     Right Sinus: No maxillary sinus tenderness or frontal sinus tenderness.     Left Sinus: No maxillary sinus tenderness or frontal sinus tenderness.     Mouth/Throat:     Mouth: Mucous membranes are moist.     Pharynx: Oropharynx is clear. Uvula midline. Posterior oropharyngeal erythema present. No pharyngeal swelling or oropharyngeal exudate.  Eyes:     General: Lids are normal. No scleral icterus.       Right eye: No discharge.        Left eye: No discharge.     Extraocular Movements: Extraocular movements intact.     Conjunctiva/sclera:  Conjunctivae normal.     Pupils: Pupils are equal, round, and reactive to light.     Visual Fields: Right eye visual fields normal and left eye visual fields normal.  Neck:     Thyroid: No thyromegaly.     Vascular: No carotid bruit.     Trachea: Trachea normal.  Cardiovascular:     Rate and Rhythm: Normal rate and regular rhythm.     Heart sounds: Normal heart sounds. No murmur heard.   No gallop.  Pulmonary:     Effort: Pulmonary effort is normal. No accessory muscle usage or respiratory distress.     Breath sounds: Normal breath sounds. No wheezing, rhonchi or rales.  Abdominal:     General: Bowel sounds are normal.     Palpations: Abdomen is soft. There is no hepatomegaly or splenomegaly.     Tenderness: There is no abdominal tenderness.  Musculoskeletal:        General: No swelling or tenderness. Normal range of motion.     Cervical back: Normal range of motion and neck supple.     Right lower leg: No edema.     Left lower leg: No edema.  Lymphadenopathy:     Head:     Right side of head: No submental, submandibular, tonsillar, preauricular or posterior auricular adenopathy.     Left side of head: No submental, submandibular, tonsillar, preauricular or posterior auricular adenopathy.     Cervical:     Right cervical: No superficial, deep or posterior cervical adenopathy.    Left cervical: No superficial, deep or posterior cervical  adenopathy.  Skin:    General: Skin is warm and dry.     Capillary Refill: Capillary refill takes less than 2 seconds.     Findings: No rash.  Neurological:     Mental Status: She is alert and oriented to person, place, and time.     Gait: Gait is intact.     Deep Tendon Reflexes: Reflexes are normal and symmetric.     Reflex Scores:      Brachioradialis reflexes are 2+ on the right side and 2+ on the left side.      Patellar reflexes are 2+ on the right side and 2+ on the left side. Psychiatric:        Attention and Perception: Attention  normal.        Mood and Affect: Mood normal.        Speech: Speech normal.        Behavior: Behavior normal. Behavior is cooperative.        Thought Content: Thought content normal.        Judgment: Judgment normal.    Results for orders placed or performed in visit on 08/21/21  Novel Coronavirus, NAA ([facility])   Specimen: Nasopharyngeal(NP) swabs in vial transport medium  Result Value Ref Range   SARS-CoV-2, NAA Not Detected Not Detected      Assessment & Plan:   Problem List Items Addressed This Visit       Cardiovascular and Mediastinum   Aortic atherosclerosis ([place])    PCP discussed at length with patient and recommended complete cessation of smoking and educated patient on risk with smoking. Patient refuses at this time. Consider addition of statin and ASA in future.      Relevant Orders   Comprehensive metabolic panel   Lipid Panel w/o Chol/HDL Ratio     Respiratory   Centrilobular emphysema (HCC) - Primary    Chronic, stable.  Continue current medication regimen and adjust as needed. Recommend complete cessation smoking.  Continue annual CT screening. Will obtain spirometry next visit.  Current sinus symptoms, will send 5 days of Prednisone.  Return in 6 months.      Relevant Medications   predniSONE (DELTASONE) 20 MG tablet   Other Relevant Orders   TSH     Digestive   Chronic idiopathic constipation    States she has been having periods of constipation and takes senna daily which is helping with bowel movement. Continue with current regimen. Notify provider if worsens.        Nervous and Auditory   Nicotine dependence, cigarettes, w unsp disorders    Recommended complete cessation of tobacco use. And other available options for assistance with tobacco cessation including over the counter methods (Nicotine gum, patch and lozenges). Also prescription options (Chantix, Nicotine Inhaler / Nasal Spray) were discussed. The patient is not interested in pursuing  tobacco cessation options at this time.         Musculoskeletal and Integument   Psoriatic arthritis ([place])    Continue collaboration with rheumatology & dermatology at [facility] and current medication regimen.  Reviewed recent notes.  Will obtain CMP today, CBC was recently obtained.      Relevant Medications   predniSONE (DELTASONE) 20 MG tablet   Other Relevant Orders   Comprehensive metabolic panel   TSH     Other   Elevated low density lipoprotein (LDL) cholesterol level    Lipid panel ordered today.      Relevant Orders   Comprehensive metabolic panel  Lipid Panel w/o Chol/HDL Ratio   Family history of breast cancer in mother    Mother with current breast cancer.  Patient educated on the importance of breast cancer screening. Reordered mammogram today.      PTSD (post-traumatic stress disorder)    Chronic, ongoing.  Continue current medication regimen and collaboration with psychiatry + therapy sessions, which patient has reported they are helping.  Denies SI/HI.      Severe recurrent major depression without psychotic features (HCC)    Chronic, ongoing.  Continue current medication regimen and collaboration with psychiatry and therapist. Denies SI/HI.  She is aware all psychiatric medications are to be filled by her psychiatrist and she needs to ensure continued care with them.  Return in 6 months.      Relevant Orders   TSH   Other Visit Diagnoses     Vitamin D deficiency       History of low levels, check today and initiate supplement as needed.   Relevant Orders   VITAMIN D 25 Hydroxy (Vit-D Deficiency, Fractures)   Colon cancer screening       Cologuard ordered.   Relevant Orders   Cologuard   Encounter for screening mammogram for malignant neoplasm of breast       Mammogram ordered   Relevant Orders   MM 3D SCREEN BREAST BILATERAL   Encounter for annual physical exam       Annual physical with labs and health maintenance reviewed.  Reassured patient about  current weight, has many stressors at this time.        Follow up plan: Return in about 6 months (around 05/01/2022) for ARTHRITIS, COPD, MOOD.   LABORATORY TESTING:  - Pap smear: up to date state it was done last year  IMMUNIZATIONS:   - Tdap: Tetanus vaccination status reviewed: last tetanus booster within 10 years. - Influenza: Up to date - Pneumovax: Up to date - Prevnar: Up to date - COVID: Refused - HPV: Not applicable - Shingrix vaccine: refuseRefused  SCREENING: -Mammogram: Ordered today would like to get it done in [place] - Colonoscopy: Refused  - Bone Density: Not applicable  -Hearing Test: Not applicable  -Spirometry: nest visit  PATIENT COUNSELING:    Advised to avoid cigarette smoking.  I discussed with the patient that most people either abstain from alcohol or drink within safe limits (<=14/week and <=4 drinks/occasion for males, <=7/weeks and <= 3 drinks/occasion for females) and that the risk for alcohol disorders and other health effects rises proportionally with the number of drinks per week and how often a drinker exceeds daily limits.  Discussed cessation/primary prevention of drug use and availability of treatment for abuse.   Diet: Encouraged to adjust caloric intake to maintain  or achieve ideal body weight, to reduce intake of dietary saturated fat and total fat, to limit sodium intake by avoiding high sodium foods and not adding table salt, and to maintain adequate dietary potassium and calcium preferably from fresh fruits, vegetables, and low-fat dairy products.    Stressed the importance of regular exercise  Injury prevention: Discussed safety belts, safety helmets, smoke detector, smoking near bedding or upholstery.   Dental health: Discussed importance of regular tooth brushing, flossing, and dental visits.    NEXT PREVENTATIVE PHYSICAL DUE IN 1 YEAR. Return in about 6 months (around 05/01/2022) for ARTHRITIS, COPD,  MOOD.

## 2021-11-01 NOTE — Assessment & Plan Note (Signed)
Followed by [facility] dermatology with recent diagnosis.  Reviewed recent notes.

## 2021-11-01 NOTE — Assessment & Plan Note (Signed)
Recommended complete cessation of tobacco use. And other available options for assistance with tobacco cessation including over the counter methods (Nicotine gum, patch and lozenges). Also prescription options (Chantix, Nicotine Inhaler / Nasal Spray) were discussed. The patient is not interested in pursuing tobacco cessation options at this time.

## 2021-11-01 NOTE — Assessment & Plan Note (Signed)
Mother with current breast cancer.  Patient educated on the importance of breast cancer screening. Reordered mammogram today.

## 2021-11-01 NOTE — Assessment & Plan Note (Addendum)
Chronic, stable.  Continue current medication regimen and adjust as needed. Recommend complete cessation smoking.  Continue annual CT screening. Will obtain spirometry next visit.  Current sinus symptoms, will send 5 days of Prednisone.  Return in 6 months.

## 2021-11-02 LAB — COMPREHENSIVE METABOLIC PANEL
ALT: 14 IU/L (ref 0–32)
AST: 20 IU/L (ref 0–40)
Albumin/Globulin Ratio: 1.7 (ref 1.2–2.2)
Albumin: 4.1 g/dL (ref 3.8–4.9)
Alkaline Phosphatase: 92 IU/L (ref 44–121)
BUN/Creatinine Ratio: 11 (ref 9–23)
BUN: 10 mg/dL (ref 6–24)
Bilirubin Total: <0.2 mg/dL (ref 0.0–1.2)
CO2: 26 mmol/L (ref 20–29)
Calcium: 9.3 mg/dL (ref 8.7–10.2)
Chloride: 105 mmol/L (ref 96–106)
Creatinine, Ser: 0.87 mg/dL (ref 0.57–1.00)
Globulin, Total: 2.4 g/dL (ref 1.5–4.5)
Glucose: 80 mg/dL (ref 70–99)
Potassium: 4.8 mmol/L (ref 3.5–5.2)
Sodium: 142 mmol/L (ref 134–144)
Total Protein: 6.5 g/dL (ref 6.0–8.5)
eGFR: 77 mL/min/{1.73_m2} (ref >59)

## 2021-11-02 LAB — LIPID PANEL W/O CHOL/HDL RATIO
Cholesterol, Total: 208 mg/dL — ABNORMAL HIGH (ref 100–199)
HDL: 47 mg/dL (ref >39)
LDL Chol Calc (NIH): 123 mg/dL — ABNORMAL HIGH (ref 0–99)
Triglycerides: 216 mg/dL — ABNORMAL HIGH (ref 0–149)
VLDL Cholesterol Cal: 38 mg/dL (ref 5–40)

## 2021-11-02 LAB — VITAMIN D 25 HYDROXY (VIT D DEFICIENCY, FRACTURES): Vit D, 25-Hydroxy: 23.8 ng/mL — ABNORMAL LOW (ref 30.0–100.0)

## 2021-11-02 LAB — TSH: TSH: 0.902 u[IU]/mL (ref 0.450–4.500)

## 2021-11-02 NOTE — Progress Notes (Signed)
Contacted via MyChart The 10-year ASCVD risk score (Arnett DK, et al., 2019) is: 5%   Values used to calculate the score:     Age: 61 years     Sex: Female     Is Non-Hispanic African American: No     Diabetic: No     Tobacco smoker: Yes     Systolic Blood Pressure: 961 mmHg     Is BP treated: No     HDL Cholesterol: 47 mg/dL     Total Cholesterol: 208 mg/dL   Good morning Emily Johnston, your labs have returned: - Cholesterol levels remain elevated, with your smoking I would recommend starting a statin in future to lower your chance of stroke or heart attack in future. - Vitamin D level lower this check, please ensure you are taking Vitamin D3 2000 units daily for bone and muscle health. - Kidney function, creatinine and eGFR, remains normal, as is liver function, AST and ALT.  Thyroid level normal.  Any questions? Keep being amazing!!  Thank you for allowing me to participate in your care.  I appreciate you. Kindest regards, Madai Nuccio

## 2021-11-04 ENCOUNTER — Ambulatory Visit: Payer: Medicare Other

## 2021-11-06 ENCOUNTER — Other Ambulatory Visit: Payer: Self-pay | Admitting: *Deleted

## 2021-11-06 DIAGNOSIS — Z87891 Personal history of nicotine dependence: Secondary | ICD-10-CM

## 2021-11-06 DIAGNOSIS — F17210 Nicotine dependence, cigarettes, uncomplicated: Secondary | ICD-10-CM

## 2021-11-12 ENCOUNTER — Other Ambulatory Visit: Payer: Self-pay

## 2021-11-12 ENCOUNTER — Ambulatory Visit
Admission: RE | Admit: 2021-11-12 | Discharge: 2021-11-12 | Disposition: A | Discharge status: Home / Self Care | Payer: 59 | Source: Ambulatory Visit | Attending: Acute Care | Admitting: Acute Care

## 2021-11-12 DIAGNOSIS — Z87891 Personal history of nicotine dependence: Secondary | ICD-10-CM | POA: Diagnosis present

## 2021-11-12 DIAGNOSIS — F17210 Nicotine dependence, cigarettes, uncomplicated: Secondary | ICD-10-CM | POA: Insufficient documentation

## 2021-11-15 ENCOUNTER — Other Ambulatory Visit: Payer: Self-pay

## 2021-11-15 ENCOUNTER — Ambulatory Visit: Payer: Medicare Other

## 2021-11-15 DIAGNOSIS — Z87891 Personal history of nicotine dependence: Secondary | ICD-10-CM

## 2021-11-15 DIAGNOSIS — F17210 Nicotine dependence, cigarettes, uncomplicated: Secondary | ICD-10-CM

## 2021-11-16 NOTE — Unmapped (Signed)
[facility] Refill Coordination Note    Specialty Medication(s) to be Shipped:   Inflammatory Disorders: Humira and methotrexate (injectable)  Other medication(s) to be shipped: BD Tuberculin Syringes     Michele Miller, DOB: 21-Jan-1961  Phone: [phone] (home)     All above HIPAA information was verified with patient.     Was a Nurse, learning disability used for this call? No    Completed refill call assessment today to schedule patient's medication shipment from the [facility] [phone]).  All relevant notes have been reviewed.     Specialty medication(s) and dose(s) confirmed: Regimen is correct and unchanged.   Changes to medications: [name] reports no changes at this time.  Changes to insurance: No  New side effects reported not previously addressed with a pharmacist or physician: None reported  Questions for the pharmacist: No    Confirmed patient received a Conservation officer, historic buildings and a Surveyor, mining with first shipment. The patient will receive a drug information handout for each medication shipped and additional FDA Medication Guides as required.       DISEASE/MEDICATION-SPECIFIC INFORMATION        For patients on injectable medications: Patient currently has 0 doses left.  Next injection is scheduled for 11/22/2021.    SPECIALTY MEDICATION ADHERENCE      Were doses missed due to medication being on hold? No    Humira 40mg /0.57ml : 0 days of medicine on hand   Methotrexate 25mg /ml : 0 days of medicine on hand     REFERRAL TO PHARMACIST     Referral to the pharmacist: Not needed    [facility]     Shipping address confirmed in Epic.     Delivery Scheduled: Yes, Expected medication delivery date: 11/20/2021.     Medication will be delivered via Same Day Courier to the prescription address in Epic WAM.    Raghav Verrilli P Allena Katz   [facility] Specialty Technician

## 2021-11-20 MED FILL — METHOTREXATE SODIUM 25 MG/ML INJECTION SOLUTION: SUBCUTANEOUS | 28 days supply | Qty: 4 | Fill #2

## 2021-11-20 MED FILL — BD TUBERCULIN SYRINGE 1 ML 27 X 1/2": 28 days supply | Qty: 4 | Fill #2

## 2021-11-20 MED FILL — HUMIRA PEN CITRATE FREE 40 MG/0.4 ML: SUBCUTANEOUS | 28 days supply | Qty: 2 | Fill #1

## 2021-12-04 ENCOUNTER — Ambulatory Visit: Payer: 59

## 2021-12-10 ENCOUNTER — Other Ambulatory Visit: Payer: Self-pay

## 2021-12-10 ENCOUNTER — Encounter: Payer: Self-pay | Admitting: Internal Medicine

## 2021-12-10 ENCOUNTER — Ambulatory Visit (INDEPENDENT_AMBULATORY_CARE_PROVIDER_SITE_OTHER): Payer: 59 | Admitting: Internal Medicine

## 2021-12-10 VITALS — BP 142/97 | HR 75 | Temp 98.1°F | Ht 63.74 in | Wt 144.4 lb

## 2021-12-10 DIAGNOSIS — R197 Diarrhea, unspecified: Secondary | ICD-10-CM

## 2021-12-10 DIAGNOSIS — F419 Anxiety disorder, unspecified: Secondary | ICD-10-CM | POA: Diagnosis not present

## 2021-12-10 DIAGNOSIS — F411 Generalized anxiety disorder: Secondary | ICD-10-CM | POA: Insufficient documentation

## 2021-12-10 MED ORDER — LACTINEX PO CHEW: 1.0000 | CHEWABLE_TABLET | Freq: Three times a day (TID) | ORAL | 1 refills | Status: DC

## 2021-12-10 MED ORDER — CLONAZEPAM 0.5 MG PO TABS
0.5000 mg | ORAL_TABLET | Freq: Two times a day (BID) | ORAL | 0 refills | Status: DC | PRN
Start: 2021-12-10 — End: 2022-01-02

## 2021-12-10 NOTE — Progress Notes (Signed)
? ?BP (!) 142/97   Pulse 75   Temp 98.1 ?F (36.7 ?C) (Oral)   Ht 5' 3.74" (1.619 m)   Wt 144 lb 6.4 oz (65.5 kg)   SpO2 96%   BMI 24.99 kg/m?   ? ?Subjective:  ? ? Patient ID: Emily Johnston, female    DOB: 10-Nov-1960, 61 y.o.   MRN: [id number] ? ?Chief Complaint  ?Patient presents with  ? Diarrhea  ?  Since last Nov 15, 2022.  ?Mother passed away last Nov 15, 2022  ? ? ?HPI: ?Emily Johnston is a 61 y.o. female ? ?Diarrhea  ?Chronicity: tried pepto not heping. mom passed away and says she has had this since then. Episode onset: has been going a lot says when she pee's she has diarrhea. in a day she says she has at least 3 very loose stools. Pertinent negatives include no abdominal pain, arthralgias, chills, coughing, fever, headaches, myalgias or vomiting.  ? ? ?Chief Complaint  ?Patient presents with  ? Diarrhea  ?  Since last 2022/11/15.  ?Mother passed away last 11-15-22  ? ? ?Relevant past medical, surgical, family and social history reviewed and updated as indicated. Interim medical history since our last visit reviewed. ?Allergies and medications reviewed and updated. ? ?Review of Systems  ?Constitutional:  Negative for activity change, appetite change, chills, fatigue and fever.  ?HENT:  Negative for congestion, ear discharge, ear pain and facial swelling.   ?Eyes:  Negative for pain and itching.  ?Respiratory:  Negative for cough, chest tightness, shortness of breath and wheezing.   ?Cardiovascular:  Negative for chest pain, palpitations and leg swelling.  ?Gastrointestinal:  Positive for diarrhea. Negative for abdominal distention, abdominal pain, blood in stool, constipation, nausea and vomiting.  ?Endocrine: Negative for cold intolerance, heat intolerance, polydipsia, polyphagia and polyuria.  ?Genitourinary:  Negative for difficulty urinating, dysuria, flank pain, frequency, hematuria and urgency.  ?Musculoskeletal:  Negative for arthralgias, gait problem, joint swelling and myalgias.  ?Skin:   Negative for color change, rash and wound.  ?Neurological:  Negative for dizziness, tremors, speech difficulty, weakness, light-headedness, numbness and headaches.  ?Hematological:  Does not bruise/bleed easily.  ?Psychiatric/Behavioral:  Negative for agitation, confusion, decreased concentration, sleep disturbance and suicidal ideas.   ? ?Per HPI unless specifically indicated above ? ?   ?Objective:  ?  ?BP (!) 142/97   Pulse 75   Temp 98.1 ?F (36.7 ?C) (Oral)   Ht 5' 3.74" (1.619 m)   Wt 144 lb 6.4 oz (65.5 kg)   SpO2 96%   BMI 24.99 kg/m?   ?Wt Readings from Last 3 Encounters:  ?12/10/21 144 lb 6.4 oz (65.5 kg)  ?11/01/21 146 lb 3.2 oz (66.3 kg)  ?09/06/21 138 lb 9.6 oz (62.9 kg)  ?  ?Physical Exam ?Vitals and nursing note reviewed.  ?Constitutional:   ?   General: She is not in acute distress. ?   Appearance: Normal appearance. She is not ill-appearing or diaphoretic.  ?Eyes:  ?   Conjunctiva/sclera: Conjunctivae normal.  ?Cardiovascular:  ?   Rate and Rhythm: Normal rate and regular rhythm.  ?Pulmonary:  ?   Breath sounds: No rhonchi.  ?Abdominal:  ?   General: Abdomen is flat. Bowel sounds are normal.  ?   Palpations: Abdomen is soft. There is no mass.  ?   Tenderness: There is no abdominal tenderness. There is no guarding.  ?   Hernia: No hernia is present.  ?Skin: ?   General: Skin is dry.  ?  Coloration: Skin is not jaundiced.  ?   Findings: No erythema.  ?Neurological:  ?   Mental Status: She is alert.  ? ? ?Results for orders placed or performed in visit on 11/01/21  ?Comprehensive metabolic panel  ?Result Value Ref Range  ? Glucose 80 70 - 99 mg/dL  ? BUN 10 6 - 24 mg/dL  ? Creatinine, Ser 0.87 0.57 - 1.00 mg/dL  ? eGFR 77 >59 mL/min/1.73  ? BUN/Creatinine Ratio 11 9 - 23  ? Sodium 142 134 - 144 mmol/L  ? Potassium 4.8 3.5 - 5.2 mmol/L  ? Chloride 105 96 - 106 mmol/L  ? CO2 26 20 - 29 mmol/L  ? Calcium 9.3 8.7 - 10.2 mg/dL  ? Total Protein 6.5 6.0 - 8.5 g/dL  ? Albumin 4.1 3.8 - 4.9 g/dL  ?  Globulin, Total 2.4 1.5 - 4.5 g/dL  ? Albumin/Globulin Ratio 1.7 1.2 - 2.2  ? Bilirubin Total <0.2 0.0 - 1.2 mg/dL  ? Alkaline Phosphatase 92 44 - 121 IU/L  ? AST 20 0 - 40 IU/L  ? ALT 14 0 - 32 IU/L  ?Lipid Panel w/o Chol/HDL Ratio  ?Result Value Ref Range  ? Cholesterol, Total 208 (H) 100 - 199 mg/dL  ? Triglycerides 216 (H) 0 - 149 mg/dL  ? HDL 47 >39 mg/dL  ? VLDL Cholesterol Cal 38 5 - 40 mg/dL  ? LDL Chol Calc (NIH) 123 (H) 0 - 99 mg/dL  ?TSH  ?Result Value Ref Range  ? TSH 0.902 0.450 - 4.500 uIU/mL  ?VITAMIN D 25 Hydroxy (Vit-D Deficiency, Fractures)  ?Result Value Ref Range  ? Vit D, 25-Hydroxy 23.8 (L) 30.0 - 100.0 ng/mL  ? ?   ? ? ?Current Outpatient Medications:  ?  acitretin (SORIATANE) 25 MG capsule, Take 1 capsule by mouth daily., Disp: , Rfl:  ?  albuterol (VENTOLIN HFA) 108 (90 Base) MCG/ACT inhaler, Inhale 2 puffs into the lungs every 6 (six) hours as needed for wheezing or shortness of breath., Disp: 1 each, Rfl: 3 ?  fluticasone (FLONASE) 50 MCG/ACT nasal spray, Place 2 sprays into both nostrils daily., Disp: 16 g, Rfl: 4 ?  folic acid (FOLVITE) 1 MG tablet, Take 1 mg by mouth daily., Disp: , Rfl:  ?  gabapentin (NEURONTIN) 300 MG capsule, Take 300 mg by mouth 3 (three) times daily., Disp: , Rfl:  ?  lactobacillus acidophilus & bulgar (LACTINEX) chewable tablet, Chew 1 tablet by mouth 3 (three) times daily with meals., Disp: 30 tablet, Rfl: 1 ?  loratadine (CLARITIN) 10 MG tablet, Take 1 tablet (10 mg total) by mouth daily., Disp: 90 tablet, Rfl: 3 ?  meclizine (ANTIVERT) 12.5 MG tablet, Take 1 tablet (12.5 mg total) by mouth 3 (three) times daily as needed for dizziness., Disp: 60 tablet, Rfl: 1 ?  meloxicam (MOBIC) 7.5 MG tablet, Mobic 7.5 mg tablet  Take 1 tablet every 12 hours by oral route., Disp: , Rfl:  ?  Multiple Vitamin (MULTIVITAMIN) tablet, Take 1 tablet by mouth daily., Disp: , Rfl:  ?  OLANZapine (ZYPREXA) 5 MG tablet, Take 2.5 mg by mouth 2 (two) times daily., Disp: , Rfl:  ?   STIOLTO RESPIMAT 2.5-2.5 MCG/ACT AERS, INHALE 2 PUFFS BY MOUTH INTO THE LUNGS ONCE DAILY, Disp: 16 g, Rfl: 4 ?  tiZANidine (ZANAFLEX) 4 MG tablet, Take 1 tablet (4 mg total) by mouth every 6 (six) hours as needed for muscle spasms., Disp: 30 tablet, Rfl: 0 ?  traZODone (DESYREL)  50 MG tablet, Take 2 tablets by mouth daily., Disp: , Rfl:  ?  Adalimumab (HUMIRA PEN) 40 MG/0.4ML PNKT, Inject into the skin., Disp: , Rfl:  ?  clonazePAM (KLONOPIN) 0.5 MG tablet, Take 1 tablet (0.5 mg total) by mouth 2 (two) times daily as needed for up to 7 days for anxiety., Disp: 14 tablet, Rfl: 0 ?  DULoxetine (CYMBALTA) 60 MG capsule, Take 2 capsules by mouth daily., Disp: , Rfl:  ?  methotrexate 250 MG/10ML injection, Inject 25 mg into the vein once a week. (Patient not taking: Reported on 12/10/2021), Disp: , Rfl:  ?  VYVANSE 30 MG capsule, Take 30 mg by mouth at bedtime. (Patient not taking: Reported on 12/10/2021), Disp: , Rfl:   ? ? ?Assessment & Plan:  ? ?Diarrhea ? Sec to anxiety ?Will start pt on probiotics.  ?To increase water itnake use gatorade to replenish electrolytes ?Check CMP ? ?Grief / anxiety: klonopin 0.5 mg to take bid prn x 2 weeks until can get in to see pcp.  ?Takes cymbalta, klonipin as needed for depression / PTSD ?Has been seeing psych. ? ? ? ?Problem List Items Addressed This Visit   ? ?  ? Other  ? Anxiety - Primary  ? Diarrhea  ?  ? ?No orders of the defined types were placed in this encounter. ?  ? ?Meds ordered this encounter  ?Medications  ? lactobacillus acidophilus & bulgar (LACTINEX) chewable tablet  ?  Sig: Chew 1 tablet by mouth 3 (three) times daily with meals.  ?  Dispense:  30 tablet  ?  Refill:  1  ? clonazePAM (KLONOPIN) 0.5 MG tablet  ?  Sig: Take 1 tablet (0.5 mg total) by mouth 2 (two) times daily as needed for up to 7 days for anxiety.  ?  Dispense:  14 tablet  ?  Refill:  0  ?  ? ?Follow up plan: ?Return in about 1 week (around 12/17/2021). ? ? ?

## 2021-12-11 DIAGNOSIS — L4050 Arthropathic psoriasis, unspecified: Principal | ICD-10-CM

## 2021-12-11 DIAGNOSIS — Z79631 Methotrexate, long term, current use: Principal | ICD-10-CM

## 2021-12-11 MED ORDER — METHOTREXATE SODIUM 25 MG/ML INJECTION SOLUTION
SUBCUTANEOUS | 0 refills | 84 days
Start: 2021-12-11 — End: 2022-03-11

## 2021-12-11 NOTE — Unmapped (Signed)
Methotrexate Refill  Last Visit Date: 09/18/2021  Next Visit Date: 03/24/2022    Lab Results   Component Value Date    ALT 12 09/18/2021    AST 16 09/18/2021    ALBUMIN 3.7 09/10/2019    CREATININE 0.68 09/18/2021     Lab Results   Component Value Date    WBC 7.2 09/18/2021    HGB 13.9 09/18/2021    HCT 41.7 09/18/2021    PLT 283 09/18/2021     Lab Results   Component Value Date    NEUTROPCT 63.8 09/18/2021    LYMPHOPCT 30.5 09/18/2021    MONOPCT 2.9 09/18/2021    EOSPCT 1.9 09/18/2021    BASOPCT 0.9 09/18/2021

## 2021-12-13 MED ORDER — METHOTREXATE SODIUM 25 MG/ML INJECTION SOLUTION
SUBCUTANEOUS | 0 refills | 84 days | Status: CP
Start: 2021-12-13 — End: 2022-03-13

## 2021-12-14 NOTE — Patient Instructions (Incomplete)
Managing Anxiety, Adult ?After being diagnosed with anxiety, you may be relieved to know why you have felt or behaved a certain way. You may also feel overwhelmed about the treatment ahead and what it will mean for your life. With care and support, you can manage this condition. ?How to manage lifestyle changes ?Managing stress and anxiety ?Stress is your body's reaction to life changes and events, both good and bad. Most stress will last just a few hours, but stress can be ongoing and can lead to more than just stress. Although stress can play a major role in anxiety, it is not the same as anxiety. Stress is usually caused by something external, such as a deadline, test, or competition. Stress normally passes after the triggering event has ended.  ?Anxiety is caused by something internal, such as imagining a terrible outcome or worrying that something will go wrong that will devastate you. Anxiety often does not go away even after the triggering event is over, and it can become long-term (chronic) worry. It is important to understand the differences between stress and anxiety and to manage your stress effectively so that it does not lead to an anxious response. ?Talk with your health care provider or a counselor to learn more about reducing anxiety and stress. He or she may suggest tension reduction techniques, such as: ?Music therapy. Spend time creating or listening to music that you enjoy and that inspires you. ?Mindfulness-based meditation. Practice being aware of your normal breaths while not trying to control your breathing. It can be done while sitting or walking. ?Centering prayer. This involves focusing on a word, phrase, or sacred image that means something to you and brings you peace. ?Deep breathing. To do this, expand your stomach and inhale slowly through your nose. Hold your breath for 3-5 seconds. Then exhale slowly, letting your stomach muscles relax. ?Self-talk. Learn to notice and identify  thought patterns that lead to anxiety reactions and change those patterns to thoughts that feel peaceful. ?Muscle relaxation. Taking time to tense muscles and then relax them. ?Choose a tension reduction technique that fits your lifestyle and personality. These techniques take time and practice. Set aside 5-15 minutes a day to do them. Therapists can offer counseling and training in these techniques. The training to help with anxiety may be covered by some insurance plans. ?Other things you can do to manage stress and anxiety include: ?Keeping a stress diary. This can help you learn what triggers your reaction and then learn ways to manage your response. ?Thinking about how you react to certain situations. You may not be able to control everything, but you can control your response. ?Making time for activities that help you relax and not feeling guilty about spending your time in this way. ?Doing visual imagery. This involves imagining or creating mental pictures to help you relax. ?Practicing yoga. Through yoga poses, you can lower tension and promote relaxation. ? ?Medicines ?Medicines can help ease symptoms. Medicines for anxiety include: ?Antidepressant medicines. These are usually prescribed for long-term daily control. ?Anti-anxiety medicines. These may be added in severe cases, especially when panic attacks occur. ?Medicines will be prescribed by a health care provider. When used together, medicines, psychotherapy, and tension reduction techniques may be the most effective treatment. ?Relationships ?Relationships can play a big part in helping you recover. Try to spend more time connecting with trusted friends and family members. ?Consider going to couples counseling if you have a partner, taking family education classes, or going to family   therapy. ?Therapy can help you and others better understand your condition. ?How to recognize changes in your anxiety ?Everyone responds differently to treatment for  anxiety. Recovery from anxiety happens when symptoms decrease and stop interfering with your daily activities at home or work. This may mean that you will start to: ?Have better concentration and focus. Worry will interfere less in your daily thinking. ?Sleep better. ?Be less irritable. ?Have more energy. ?Have improved memory. ?It is also important to recognize when your condition is getting worse. Contact your health care provider if your symptoms interfere with home or work and you feel like your condition is not improving. ?Follow these instructions at home: ?Activity ?Exercise. Adults should do the following: ?Exercise for at least 150 minutes each week. The exercise should increase your heart rate and make you sweat (moderate-intensity exercise). ?Strengthening exercises at least twice a week. ?Get the right amount and quality of sleep. Most adults need 7-9 hours of sleep each night. ?Lifestyle ? ?Eat a healthy diet that includes plenty of vegetables, fruits, whole grains, low-fat dairy products, and lean protein. ?Do not eat a lot of foods that are high in fats, added sugars, or salt (sodium). ?Make choices that simplify your life. ?Do not use any products that contain nicotine or tobacco. These products include cigarettes, chewing tobacco, and vaping devices, such as e-cigarettes. If you need help quitting, ask your health care provider. ?Avoid caffeine, alcohol, and certain over-the-counter cold medicines. These may make you feel worse. Ask your pharmacist which medicines to avoid. ?General instructions ?Take over-the-counter and prescription medicines only as told by your health care provider. ?Keep all follow-up visits. This is important. ?Where to find support ?You can get help and support from these sources: ?Self-help groups. ?Online and community organizations. ?A trusted spiritual leader. ?Couples counseling. ?Family education classes. ?Family therapy. ?Where to find more information ?You may find  that joining a support group helps you deal with your anxiety. The following sources can help you locate counselors or support groups near you: ?Mental Health America: www.mentalhealthamerica.net ?Anxiety and Depression Association of America (ADAA): www.adaa.org ?National Alliance on Mental Illness (NAMI): www.nami.org ?Contact a health care provider if: ?You have a hard time staying focused or finishing daily tasks. ?You spend many hours a day feeling worried about everyday life. ?You become exhausted by worry. ?You start to have headaches or frequently feel tense. ?You develop chronic nausea or diarrhea. ?Get help right away if: ?You have a racing heart and shortness of breath. ?You have thoughts of hurting yourself or others. ?If you ever feel like you may hurt yourself or others, or have thoughts about taking your own life, get help right away. Go to your nearest emergency department or: ?Call your local emergency services (911 in the [place] ?Call a suicide crisis helpline, such as the National Suicide Prevention Lifeline at 1-800-273-8255 or 988 in the [place] This is open 24 hours a day in the [place] ?Text the Crisis Text Line at 741741 (in the [place] ?Summary ?Taking steps to learn and use tension reduction techniques can help calm you and help prevent triggering an anxiety reaction. ?When used together, medicines, psychotherapy, and tension reduction techniques may be the most effective treatment. ?Family, friends, and partners can play a big part in supporting you. ?This information is not intended to replace advice given to you by your health care provider. Make sure you discuss any questions you have with your health care provider. ?Document Revised: 03/27/2021 Document Reviewed: 12/23/2020 ?Elsevier Patient   Education ? 2022 [employer]vier Inc. ? ?

## 2021-12-19 ENCOUNTER — Ambulatory Visit: Payer: 59 | Admitting: Nurse Practitioner

## 2021-12-20 NOTE — Unmapped (Signed)
[facility] Refill Coordination Note    Specialty Medication(s) to be Shipped:   Inflammatory Disorders: Humira and methotrexate (injectable)    Other medication(s) to be shipped: syringes     [name], DOB: 04/24/1961  Phone: [phone] (home)       All above HIPAA information was verified with patient.     Was a Nurse, learning disability used for this call? No    Completed refill call assessment today to schedule patient's medication shipment from the [facility] [phone]).  All relevant notes have been reviewed.     Specialty medication(s) and dose(s) confirmed: Regimen is correct and unchanged.   Changes to medications: [name] reports no changes at this time.  Changes to insurance: No  New side effects reported not previously addressed with a pharmacist or physician: None reported  Questions for the pharmacist: No    Confirmed patient received a Conservation officer, historic buildings and a Surveyor, mining with first shipment. The patient will receive a drug information handout for each medication shipped and additional FDA Medication Guides as required.       DISEASE/MEDICATION-SPECIFIC INFORMATION        For patients on injectable medications: Patient currently has 1 doses left.  Next injection is scheduled for 4/11.    SPECIALTY MEDICATION ADHERENCE     Medication Adherence    Patient reported X missed doses in the last month: 0  Specialty Medication: Humira  Patient is on additional specialty medications: Yes  Additional Specialty Medications: Methotrexate  Patient Reported Additional Medication X Missed Doses in the Last Month: 0  Patient is on more than two specialty medications: No  Any gaps in refill history greater than 2 weeks in the last 3 months: no  Demonstrates understanding of importance of adherence: yes  Informant: patient              Were doses missed due to medication being on hold? No    Humira 40mg /0.35ml: Patient has 14 days of medication on hand  Methotrexate 25mg /ml: Patient has 7 days of medication on hand    REFERRAL TO PHARMACIST     Referral to the pharmacist: Not needed      [facility]     Shipping address confirmed in Epic.     Delivery Scheduled: Yes, Expected medication delivery date: 4/12.     Medication will be delivered via Same Day Courier to the prescription address in Epic WAM.    [name]   [facility] Specialty Technician

## 2021-12-25 MED FILL — METHOTREXATE SODIUM 25 MG/ML INJECTION SOLUTION: SUBCUTANEOUS | 84 days supply | Qty: 12 | Fill #0

## 2021-12-25 MED FILL — HUMIRA PEN CITRATE FREE 40 MG/0.4 ML: SUBCUTANEOUS | 28 days supply | Qty: 2 | Fill #2

## 2021-12-25 MED FILL — BD TUBERCULIN SYRINGE 1 ML 27 X 1/2": 28 days supply | Qty: 4 | Fill #3

## 2022-01-01 ENCOUNTER — Encounter: Payer: Self-pay | Admitting: Emergency Medicine

## 2022-01-01 ENCOUNTER — Ambulatory Visit: Payer: 59 | Admitting: Internal Medicine

## 2022-01-01 ENCOUNTER — Ambulatory Visit
Admission: EM | Admit: 2022-01-01 | Discharge: 2022-01-01 | Disposition: A | Discharge status: Home / Self Care | Payer: 59 | Attending: Emergency Medicine | Admitting: Emergency Medicine

## 2022-01-01 DIAGNOSIS — Z20822 Contact with and (suspected) exposure to covid-19: Secondary | ICD-10-CM | POA: Insufficient documentation

## 2022-01-01 DIAGNOSIS — J0140 Acute pansinusitis, unspecified: Secondary | ICD-10-CM | POA: Insufficient documentation

## 2022-01-01 DIAGNOSIS — H9202 Otalgia, left ear: Secondary | ICD-10-CM | POA: Diagnosis not present

## 2022-01-01 DIAGNOSIS — H5213 Myopia, bilateral: Secondary | ICD-10-CM | POA: Diagnosis present

## 2022-01-01 DIAGNOSIS — J441 Chronic obstructive pulmonary disease with (acute) exacerbation: Secondary | ICD-10-CM | POA: Diagnosis not present

## 2022-01-01 LAB — RESP PANEL BY RT-PCR (FLU A&B, COVID) ARPGX2
Influenza A by PCR: NEGATIVE
Influenza B by PCR: NEGATIVE
SARS Coronavirus 2 by RT PCR: NEGATIVE

## 2022-01-01 MED ORDER — AEROCHAMBER PLUS MISC: 2 refills | Status: DC

## 2022-01-01 MED ORDER — OLOPATADINE HCL 0.2 % OP SOLN: 1.0000 [drp] | Freq: Every day | OPHTHALMIC | 0 refills | Status: DC

## 2022-01-01 MED ORDER — PREDNISONE 20 MG PO TABS: 40.0000 mg | ORAL_TABLET | Freq: Every day | ORAL | 0 refills | Status: DC

## 2022-01-01 MED ORDER — AMOXICILLIN-POT CLAVULANATE 875-125 MG PO TABS: 1.0000 | ORAL_TABLET | Freq: Two times a day (BID) | ORAL | 0 refills | Status: AC

## 2022-01-01 NOTE — ED Provider Notes (Signed)
HPI ? ?SUBJECTIVE: ? ?Emily Johnston is a 61 y.o. female who presents with multiple issues: First, she reports 2 days of bilateral conjunctival injection, states that her eyes are matted shut/crusty in the morning.  She reports a gritty feeling, and has been experiencing itchy eyes, nasal congestion.  No eye pain, pain with extraocular movements, photophobia.  She reports blurry vision with small print.  Not sure if distance vision has changed.  No contacts with pinkeye.  She wears glasses for reading.  She does not wear contacts.   ? ?Second, she reports left ear pain starting today.  No change in hearing, otorrhea..  She reports body aches, nasal congestion, postnasal drip, sore, scratchy throat, and states that her face feels swollen around her maxillary sinuses.   ? ?Third, she reports cough for the past several weeks that became productive of "white stuff" 2 days ago.  She notes an increase in the amount of sputum that she normally produces.  It has not changed in color.  She reports wheezing, dyspnea on exertion.  No shortness of breath at rest.  No loss of sense of smell, but she reports altered sense of taste.  No nausea, vomiting, new or different diarrhea, abdominal pain.  No known COVID, flu exposure.  She got the first dose of the COVID-vaccine, and also got this years flu vaccine.  She took Tylenol within 6 hours of evaluation.  She has also tried Sudafed, Mucinex, and is on a daily antihistamine and Flonase.  States antihistamine is no longer working.  No aggravating or alleviating factors. ? ?She has a past medical history of COPD, allergies, fibromyalgia, IBS on methotrexate and Humira, COVID 6 months ago.  She continues to smoke.  No history of hypertension, diabetes, chronic kidney disease.  States that she has elevated blood pressure readings every time she goes to a primary care provider.  PCP: [facility].  She has an appointment with her PCP tomorrow. ? ?Past Medical  History:  ?Diagnosis Date  ? Anxiety   ? Arthritis   ? COPD (chronic obstructive pulmonary disease) ([place])   ? Depression   ? Fibromyalgia   ? IBS (irritable bowel syndrome)   ? PTSD (post-traumatic stress disorder)   ? ? ?Past Surgical History:  ?Procedure Laterality Date  ? ABDOMINAL HYSTERECTOMY    ? CARPAL TUNNEL RELEASE    ? KNEE SURGERY    ? ? ?History reviewed. No pertinent family history. ? ?Social History  ? ?Tobacco Use  ? Smoking status: Every Day  ?  Packs/day: 1.00  ?  Years: 41.00  ?  Pack years: 41.00  ?  Types: Cigarettes  ? Smokeless tobacco: Never  ?Vaping Use  ? Vaping Use: Never used  ?Substance Use Topics  ? Alcohol use: No  ? Drug use: No  ? ? ?No current facility-administered medications for this encounter. ? ?Current Outpatient Medications:  ?  amoxicillin-clavulanate (AUGMENTIN) 875-125 MG tablet, Take 1 tablet by mouth 2 (two) times daily for 7 days., Disp: 14 tablet, Rfl: 0 ?  Olopatadine HCl 0.2 % SOLN, Apply 1 drop to eye daily., Disp: 2.5 mL, Rfl: 0 ?  predniSONE (DELTASONE) 20 MG tablet, Take 2 tablets (40 mg total) by mouth daily with breakfast for 5 days., Disp: 10 tablet, Rfl: 0 ?  Spacer/Aero-Holding Chambers (AEROCHAMBER PLUS) inhaler, Use with inhaler, Disp: 1 each, Rfl: 2 ?  acitretin (SORIATANE) 25 MG capsule, Take 1 capsule by mouth daily., Disp: , Rfl:  ?  Adalimumab (HUMIRA PEN) 40 MG/0.4ML PNKT, Inject into the skin., Disp: , Rfl:  ?  albuterol (VENTOLIN HFA) 108 (90 Base) MCG/ACT inhaler, Inhale 2 puffs into the lungs every 6 (six) hours as needed for wheezing or shortness of breath., Disp: 1 each, Rfl: 3 ?  clonazePAM (KLONOPIN) 0.5 MG tablet, Take 1 tablet (0.5 mg total) by mouth 2 (two) times daily as needed for up to 7 days for anxiety., Disp: 14 tablet, Rfl: 0 ?  DULoxetine (CYMBALTA) 60 MG capsule, Take 2 capsules by mouth daily., Disp: , Rfl:  ?  fluticasone (FLONASE) 50 MCG/ACT nasal spray, Place 2 sprays into both nostrils daily., Disp: 16 g, Rfl: 4 ?  folic acid  (FOLVITE) 1 MG tablet, Take 1 mg by mouth daily., Disp: , Rfl:  ?  gabapentin (NEURONTIN) 300 MG capsule, Take 300 mg by mouth 3 (three) times daily., Disp: , Rfl:  ?  lactobacillus acidophilus & bulgar (LACTINEX) chewable tablet, Chew 1 tablet by mouth 3 (three) times daily with meals., Disp: 30 tablet, Rfl: 1 ?  meclizine (ANTIVERT) 12.5 MG tablet, Take 1 tablet (12.5 mg total) by mouth 3 (three) times daily as needed for dizziness., Disp: 60 tablet, Rfl: 1 ?  meloxicam (MOBIC) 7.5 MG tablet, Mobic 7.5 mg tablet  Take 1 tablet every 12 hours by oral route., Disp: , Rfl:  ?  methotrexate 250 MG/10ML injection, Inject 25 mg into the vein once a week. (Patient not taking: Reported on 12/10/2021), Disp: , Rfl:  ?  Multiple Vitamin (MULTIVITAMIN) tablet, Take 1 tablet by mouth daily., Disp: , Rfl:  ?  OLANZapine (ZYPREXA) 5 MG tablet, Take 2.5 mg by mouth 2 (two) times daily., Disp: , Rfl:  ?  STIOLTO RESPIMAT 2.5-2.5 MCG/ACT AERS, INHALE 2 PUFFS BY MOUTH INTO THE LUNGS ONCE DAILY, Disp: 16 g, Rfl: 4 ?  tiZANidine (ZANAFLEX) 4 MG tablet, Take 1 tablet (4 mg total) by mouth every 6 (six) hours as needed for muscle spasms., Disp: 30 tablet, Rfl: 0 ?  traZODone (DESYREL) 50 MG tablet, Take 2 tablets by mouth daily., Disp: , Rfl:  ? ?No Known Allergies ? ? ?ROS ? ?As noted in HPI.  ? ?Physical Exam ? ?BP (!) 118/105 (BP Location: Left Arm)   Pulse 91   Temp 98 ?F (36.7 ?C) (Oral)   Resp 19   Ht 5' 3.75" (1.619 m)   Wt 65.5 kg   SpO2 98%   BMI 24.98 kg/m?  ? ? ?Constitutional: Well developed, well nourished, no acute distress ?Eyes:  EOMI, PERRLA, very mild bilateral conjunctival injection.  No purulent discharge.  No periorbital erythema, edema.  No lid swelling.  No foreign body seen on lid eversion.  No corneal abrasion seen on flourescin exam. ?    Visual Acuity ? ?Right Eye Distance: 20/50 uncorrected ?Left Eye Distance: 20/50 uncorrected ?Bilateral Distance: 20/50 uncorrected ? ?Right Eye Near:   ?Left Eye Near:     ?Bilateral Near:     ? ?HENT: Normocephalic, atraumatic,mucus membranes moist.  Positive nasal congestion.  Normal turbinates.  Positive maxillary, frontal sinus tenderness.  Bilateral external ears, EAC, TM normal bilaterally.  No pain with traction on pinna, palpation of tragus or mastoid bilaterally.  No TMJ tenderness bilaterally.  Normal oropharynx.  Positive postnasal drip. ?Neck: No cervical lymphadenopathy ?Respiratory: Normal inspiratory effort, scattered expiratory wheezing.  Fair air movement. ?Cardiovascular: Normal rate, regular rhythm ?GI: nondistended ?skin: No rash, skin intact ?Musculoskeletal: no deformities ?Neurologic: Alert & oriented x  3, no focal neuro deficits ?Psychiatric: Speech and behavior appropriate.  Appears anxious. ? ? ?ED Course ? ? ?Medications - No data to display ? ?Orders Placed This Encounter  ?Procedures  ? Resp Panel by RT-PCR (Flu A&B, Covid) Nasopharyngeal Swab  ?  Standing Status:   Standing  ?  Number of Occurrences:   1  ? Visual acuity screening  ?  Standing Status:   Standing  ?  Number of Occurrences:   1  ? Airborne and Contact precautions  ?  Standing Status:   Standing  ?  Number of Occurrences:   1  ? ? ?Results for orders placed or performed during the hospital encounter of 01/01/22 (from the past 24 hour(s))  ?Resp Panel by RT-PCR (Flu A&B, Covid) Nasopharyngeal Swab     Status: None  ? Collection Time: 01/01/22  4:21 PM  ? Specimen: Nasopharyngeal Swab; Nasopharyngeal(NP) swabs in vial transport medium  ?Result Value Ref Range  ? SARS Coronavirus 2 by RT PCR NEGATIVE NEGATIVE  ? Influenza A by PCR NEGATIVE NEGATIVE  ? Influenza B by PCR NEGATIVE NEGATIVE  ? ?No results found. ? ?ED Clinical Impression ? ?1. COPD exacerbation ([place])   ?2. Left ear pain   ?3. Acute non-recurrent pansinusitis   ?4. Encounter for laboratory testing for COVID-19 virus   ?5. Nearsightedness, bilateral   ?  ? ?ED Assessment/Plan ? ?1.  Conjunctivitis.  She has no purulent drainage,  no evidence of foreign body or corneal abrasion.  Suspect irritant, viral or allergic conjunctivitis.  will try Systane, Pataday.   ? ?2.  Otalgia.  No evidence of otitis media, otitis externa, TMJ arthralgia.  Sus

## 2022-01-01 NOTE — Discharge Instructions (Addendum)
Try Systane, cool or warm compresses, whichever feels better and Pataday for your eyes.  Do not touch your eyes.  Make sure you wash your hands frequently. ? ?I suspect that your ear pain is coming from eustachian tube dysfunction.  Flonase, Mucinex, start saline nasal irrigation with a Milta Deiters Med rinse and distilled water as often as you want.  I will contact you if and only if your COVID or flu come back positive.  I will call in the appropriate antiviral if it is positive.  Make sure you answer phone calls from unknown numbers today.  If your viral testing is negative, go ahead and start Augmentin, which will take care of a sinus infection and COPD exacerbation.  2 puffs from your albuterol inhaler using your spacer every 4 hours for 2 days, then every 6 hours for 2 days, then as needed.  You can back off on the albuterol if you start to improve sooner.  Finish the prednisone, unless you are primary care provider tells you to stop.   ? ?Have your blood pressure rechecked by your primary care provider tomorrow.  It was elevated today ?

## 2022-01-01 NOTE — ED Triage Notes (Signed)
Pt reports bilateral eye drainage, left ear pain and sore throat for a "couple" days.  ?

## 2022-01-02 ENCOUNTER — Ambulatory Visit
Admission: RE | Admit: 2022-01-02 | Discharge: 2022-01-02 | Disposition: A | Discharge status: Home / Self Care | Payer: 59 | Source: Ambulatory Visit | Attending: Internal Medicine | Admitting: Internal Medicine

## 2022-01-02 ENCOUNTER — Encounter: Payer: Self-pay | Admitting: Internal Medicine

## 2022-01-02 ENCOUNTER — Ambulatory Visit (INDEPENDENT_AMBULATORY_CARE_PROVIDER_SITE_OTHER): Payer: 59 | Admitting: Internal Medicine

## 2022-01-02 VITALS — BP 130/90 | HR 106 | Temp 98.1°F | Ht 63.74 in | Wt 143.3 lb

## 2022-01-02 DIAGNOSIS — K591 Functional diarrhea: Secondary | ICD-10-CM

## 2022-01-02 DIAGNOSIS — R197 Diarrhea, unspecified: Secondary | ICD-10-CM | POA: Diagnosis present

## 2022-01-02 LAB — CBC WITH DIFFERENTIAL/PLATELET
Hematocrit: 41.5 % (ref 34.0–46.6)
Hemoglobin: 14.3 g/dL (ref 11.1–15.9)
Lymphocytes Absolute: 0.9 10*3/uL (ref 0.7–3.1)
Lymphs: 8 %
MCH: 34.2 pg — ABNORMAL HIGH (ref 26.6–33.0)
MCHC: 34.5 g/dL (ref 31.5–35.7)
MCV: 99 fL — ABNORMAL HIGH (ref 79–97)
MID (Absolute): 0.4 10*3/uL (ref 0.1–1.6)
MID: 3 %
Neutrophils Absolute: 10.1 10*3/uL — ABNORMAL HIGH (ref 1.4–7.0)
Neutrophils: 89 %
Platelets: 330 10*3/uL (ref 150–450)
RBC: 4.18 x10E6/uL (ref 3.77–5.28)
RDW: 15.8 % — ABNORMAL HIGH (ref 11.7–15.4)
WBC: 11.4 10*3/uL — ABNORMAL HIGH (ref 3.4–10.8)

## 2022-01-02 MED ORDER — IOHEXOL 300 MG/ML  SOLN
100.0000 mL | Freq: Once | INTRAMUSCULAR | Status: AC | PRN
  Administered 2022-01-02: 100 mL via INTRAVENOUS

## 2022-01-02 NOTE — Progress Notes (Signed)
? ?BP 130/90   Pulse (!) 106   Temp 98.1 ?F (36.7 ?C) (Oral)   Ht 5' 3.74" (1.619 m)   Wt 143 lb 4.8 oz (65 kg)   SpO2 98%   BMI 24.80 kg/m?   ? ?Subjective:  ? ? Patient ID: Emily Johnston, female    DOB: September 24, 1960, 61 y.o.   MRN: [id number] ? ?Chief Complaint  ?Patient presents with  ? GI Problem  ? Diarrhea  ?  Since march 23rd  ? Anxiety  ? Depression  ? ? ?HPI: ?DIONISIA PACHOLSKI is a 61 y.o. female ? ?GI Problem ?The primary symptoms include abdominal pain and diarrhea. Primary symptoms do not include fever, weight loss, fatigue, nausea, vomiting or melena.  ?Diarrhea  ?Associated symptoms include abdominal pain. Pertinent negatives include no fever, vomiting or weight loss.  ?Anxiety ?Patient reports no nausea.  ? ? ?Depression ?       Associated symptoms include no fatigue.  Past medical history includes anxiety.   ? ?Chief Complaint  ?Patient presents with  ? GI Problem  ? Diarrhea  ?  Since march 23rd  ? Anxiety  ? Depression  ? ? ?Relevant past medical, surgical, family and social history reviewed and updated as indicated. Interim medical history since our last visit reviewed. ?Allergies and medications reviewed and updated. ? ?Review of Systems  ?Constitutional:  Negative for fatigue, fever and weight loss.  ?Gastrointestinal:  Positive for abdominal pain and diarrhea. Negative for melena, nausea and vomiting.  ?Psychiatric/Behavioral:  Positive for depression.   ? ?Per HPI unless specifically indicated above ? ?   ?Objective:  ?  ?BP 130/90   Pulse (!) 106   Temp 98.1 ?F (36.7 ?C) (Oral)   Ht 5' 3.74" (1.619 m)   Wt 143 lb 4.8 oz (65 kg)   SpO2 98%   BMI 24.80 kg/m?   ?Wt Readings from Last 3 Encounters:  ?01/02/22 143 lb 4.8 oz (65 kg)  ?01/01/22 144 lb 6.4 oz (65.5 kg)  ?12/10/21 144 lb 6.4 oz (65.5 kg)  ?  ?Physical Exam ?Vitals and nursing note reviewed.  ?Constitutional:   ?   General: She is not in acute distress. ?   Appearance: Normal appearance. She is not ill-appearing  or diaphoretic.  ?Eyes:  ?   Conjunctiva/sclera: Conjunctivae normal.  ?Pulmonary:  ?   Breath sounds: No rhonchi.  ?Abdominal:  ?   General: Abdomen is flat. Bowel sounds are normal. There is no distension.  ?   Palpations: Abdomen is soft. There is no mass.  ?   Tenderness: There is no abdominal tenderness. There is no guarding.  ?Skin: ?   General: Skin is warm and dry.  ?   Coloration: Skin is not jaundiced.  ?   Findings: No erythema.  ?Neurological:  ?   Mental Status: She is alert.  ? ? ?Results for orders placed or performed during the hospital encounter of 01/01/22  ?Resp Panel by RT-PCR (Flu A&B, Covid) Nasopharyngeal Swab  ? Specimen: Nasopharyngeal Swab; Nasopharyngeal(NP) swabs in vial transport medium  ?Result Value Ref Range  ? SARS Coronavirus 2 by RT PCR NEGATIVE NEGATIVE  ? Influenza A by PCR NEGATIVE NEGATIVE  ? Influenza B by PCR NEGATIVE NEGATIVE  ? ?   ? ? ?Current Outpatient Medications:  ?  acitretin (SORIATANE) 25 MG capsule, Take 1 capsule by mouth daily., Disp: , Rfl:  ?  albuterol (VENTOLIN HFA) 108 (90 Base) MCG/ACT inhaler, Inhale 2 puffs  into the lungs every 6 (six) hours as needed for wheezing or shortness of breath., Disp: 1 each, Rfl: 3 ?  amoxicillin-clavulanate (AUGMENTIN) 875-125 MG tablet, Take 1 tablet by mouth 2 (two) times daily for 7 days., Disp: 14 tablet, Rfl: 0 ?  methotrexate 250 MG/10ML injection, Inject 25 mg into the vein once a week., Disp: , Rfl:  ?  Multiple Vitamin (MULTIVITAMIN) tablet, Take 1 tablet by mouth daily., Disp: , Rfl:  ?  OLANZapine (ZYPREXA) 5 MG tablet, Take 2.5 mg by mouth 2 (two) times daily., Disp: , Rfl:  ?  Olopatadine HCl 0.2 % SOLN, Apply 1 drop to eye daily., Disp: 2.5 mL, Rfl: 0 ?  predniSONE (DELTASONE) 20 MG tablet, Take 2 tablets (40 mg total) by mouth daily with breakfast for 5 days., Disp: 10 tablet, Rfl: 0 ?  Spacer/Aero-Holding Chambers (AEROCHAMBER PLUS) inhaler, Use with inhaler, Disp: 1 each, Rfl: 2 ?  STIOLTO RESPIMAT 2.5-2.5  MCG/ACT AERS, INHALE 2 PUFFS BY MOUTH INTO THE LUNGS ONCE DAILY, Disp: 16 g, Rfl: 4 ?  Adalimumab (HUMIRA PEN) 40 MG/0.4ML PNKT, Inject into the skin., Disp: , Rfl:  ?  atomoxetine (STRATTERA) 40 MG capsule, Take 40 mg by mouth every morning., Disp: , Rfl:  ?  clobetasol ointment (TEMOVATE) 2.03 %, Apply 1 application. topically 2 (two) times daily., Disp: , Rfl:  ?  doxepin (SINEQUAN) 10 MG capsule, Take 10 mg by mouth at bedtime., Disp: , Rfl:  ?  DULoxetine (CYMBALTA) 60 MG capsule, Take 2 capsules by mouth daily., Disp: , Rfl:  ?  fluticasone (FLONASE) 50 MCG/ACT nasal spray, Place 2 sprays into both nostrils daily., Disp: 16 g, Rfl: 4 ?  folic acid (FOLVITE) 1 MG tablet, Take 1 mg by mouth daily., Disp: , Rfl:  ?  gabapentin (NEURONTIN) 300 MG capsule, Take 300 mg by mouth 3 (three) times daily., Disp: , Rfl:  ?  lactobacillus acidophilus & bulgar (LACTINEX) chewable tablet, Chew 1 tablet by mouth 3 (three) times daily with meals., Disp: 30 tablet, Rfl: 1 ?  meclizine (ANTIVERT) 12.5 MG tablet, Take 1 tablet (12.5 mg total) by mouth 3 (three) times daily as needed for dizziness., Disp: 60 tablet, Rfl: 1 ?  meloxicam (MOBIC) 7.5 MG tablet, Mobic 7.5 mg tablet  Take 1 tablet every 12 hours by oral route. (Patient not taking: Reported on 01/02/2022), Disp: , Rfl:   ? ? ?Assessment & Plan:  ?Abdominal pain/ diarrhea :  ?Will need to schedule a STAT CT abdomen/ pelvis as pt has persistent diarrhea x over 1 1/2 now.  ? ?2. Anxiety pt says she has her family visiting ?Will need to fu with PCP asap regarding other issues.  ?Has multipl meds which she is taking for her psychiatric issues.  ? ?Problem List Items Addressed This Visit   ?None ?  ? ?No orders of the defined types were placed in this encounter. ?  ? ?No orders of the defined types were placed in this encounter. ?  ? ?Follow up plan: ?No follow-ups on file. ? ?

## 2022-01-03 NOTE — Progress Notes (Signed)
Hey , I had seen this pt yesterday for diarrhea no obvious causes on CT , you will see her next week -  just [facility]

## 2022-01-03 NOTE — Progress Notes (Signed)
Ct shows no obvious causes of diarrhea please call pt and let her know. She will need to fu with pcp as scheduled to go over her other medical issues.Marland Kitchen

## 2022-01-04 NOTE — Patient Instructions (Addendum)
At home start taking Metamucil daily + add on Senna S daily (one tablet daily). ? ?Constipation, Adult ?Constipation is when a person has trouble pooping (having a bowel movement). When you have this condition, you may poop fewer than 3 times a week. Your poop (stool) may also be dry, hard, or bigger than normal. ?Follow these instructions at home: ?Eating and drinking ? ?Eat foods that have a lot of fiber, such as: ?Fresh fruits and vegetables. ?Whole grains. ?Beans. ?Eat less of foods that are low in fiber and high in fat and sugar, such as: ?[place] fries. ?Hamburgers. ?Cookies. ?Candy. ?Soda. ?Drink enough fluid to keep your pee (urine) pale yellow. ?General instructions ?Exercise regularly or as told by your doctor. Try to do 150 minutes of exercise each week. ?Go to the restroom when you feel like you need to poop. Do not hold it in. ?Take over-the-counter and prescription medicines only as told by your doctor. These include any fiber supplements. ?When you poop: ?Do deep breathing while relaxing your lower belly (abdomen). ?Relax your pelvic floor. The pelvic floor is a group of muscles that support the rectum, bladder, and intestines (as well as the uterus in women). ?Watch your condition for any changes. Tell your doctor if you notice any. ?Keep all follow-up visits as told by your doctor. This is important. ?Contact a doctor if: ?You have pain that gets worse. ?You have a fever. ?You have not pooped for 4 days. ?You vomit. ?You are not hungry. ?You lose weight. ?You are bleeding from the opening of the butt (anus). ?You have thin, pencil-like poop. ?Get help right away if: ?You have a fever, and your symptoms suddenly get worse. ?You leak poop or have blood in your poop. ?Your belly feels hard or bigger than normal (bloated). ?You have very bad belly pain. ?You feel dizzy or you faint. ?Summary ?Constipation is when a person poops fewer than 3 times a week, has trouble pooping, or has poop that is dry,  hard, or bigger than normal. ?Eat foods that have a lot of fiber. ?Drink enough fluid to keep your pee (urine) pale yellow. ?Take over-the-counter and prescription medicines only as told by your doctor. These include any fiber supplements. ?This information is not intended to replace advice given to you by your health care provider. Make sure you discuss any questions you have with your health care provider. ?Document Revised: 07/20/2019 Document Reviewed: 07/20/2019 ?Elsevier Patient Education ? [place]. ? ?

## 2022-01-06 ENCOUNTER — Ambulatory Visit (INDEPENDENT_AMBULATORY_CARE_PROVIDER_SITE_OTHER): Payer: 59 | Admitting: Nurse Practitioner

## 2022-01-06 ENCOUNTER — Encounter: Payer: Self-pay | Admitting: Nurse Practitioner

## 2022-01-06 VITALS — BP 128/86 | HR 81 | Temp 98.1°F | Ht 63.74 in | Wt 148.0 lb

## 2022-01-06 DIAGNOSIS — F4310 Post-traumatic stress disorder, unspecified: Secondary | ICD-10-CM | POA: Diagnosis not present

## 2022-01-06 DIAGNOSIS — I700 Atherosclerosis of aorta: Secondary | ICD-10-CM | POA: Diagnosis not present

## 2022-01-06 DIAGNOSIS — L4050 Arthropathic psoriasis, unspecified: Secondary | ICD-10-CM

## 2022-01-06 DIAGNOSIS — J432 Centrilobular emphysema: Secondary | ICD-10-CM | POA: Diagnosis not present

## 2022-01-06 DIAGNOSIS — F332 Major depressive disorder, recurrent severe without psychotic features: Secondary | ICD-10-CM | POA: Diagnosis not present

## 2022-01-06 DIAGNOSIS — F17219 Nicotine dependence, cigarettes, with unspecified nicotine-induced disorders: Secondary | ICD-10-CM

## 2022-01-06 DIAGNOSIS — K582 Mixed irritable bowel syndrome: Secondary | ICD-10-CM

## 2022-01-06 DIAGNOSIS — M797 Fibromyalgia: Secondary | ICD-10-CM

## 2022-01-06 MED ORDER — DICYCLOMINE HCL 10 MG PO CAPS: 10.0000 mg | ORAL_CAPSULE | Freq: Three times a day (TID) | ORAL | 1 refills | Status: DC

## 2022-01-06 MED ORDER — CLONAZEPAM 0.5 MG PO TABS: 0.5000 mg | ORAL_TABLET | Freq: Two times a day (BID) | ORAL | 0 refills | Status: DC | PRN

## 2022-01-06 MED ORDER — MONTELUKAST SODIUM 10 MG PO TABS: 10.0000 mg | ORAL_TABLET | Freq: Every day | ORAL | 4 refills | Status: DC

## 2022-01-06 NOTE — Assessment & Plan Note (Signed)
Chronic, stable.  Continue current medication regimen and adjust as needed. Recommend complete cessation smoking.  Continue annual CT screening. Will obtain spirometry next visit. Current sinus symptoms treatment on board.  Will start Singulair to help with exacerbation caused by allergies, discussed BLACK BOX warning with her and she is aware to stop if any SI presents.   ?

## 2022-01-06 NOTE — Assessment & Plan Note (Signed)
Chronic, ongoing.  Continue current medication regimen and collaboration with therapist.  New referral to psychiatry placed as poor rapport with current provider and would like a new psychiatrist -- is more complex case and would benefit from ongoing psychiatry.   Denies SI/HI.  At this time will provide her a short burst of Klonopin to take as needed due to increased anxiety with loss of her mother, she is aware to use sparsely only and PCP will not continue this long term.   ?

## 2022-01-06 NOTE — Progress Notes (Signed)
? ?BP 128/86   Pulse 81   Temp 98.1 ?F (36.7 ?C) (Oral)   Ht 5' 3.74" (1.619 m)   Wt 148 lb (67.1 kg)   SpO2 97%   BMI 25.61 kg/m?   ? ?Subjective:  ? ? Patient ID: Emily Johnston, female    DOB: 06-17-1961, 61 y.o.   MRN: [id number] ? ?HPI: ?Emily Johnston is a 61 y.o. female ? ?Chief Complaint  ?Patient presents with  ? Anxiety  ?  Patient is here to follow up on her nerves. Patient recently loss her mother and says her world has been upside down since then. Patient states she has been experiencing bloating and constipation. Patient states she was supposed to perform a Cologuard, but have been having constipation. Patient states she had imaging on Thursday and would like to discuss.   ? Constipation  ? Bloated  ? ?CONSTIPATION ?Ongoing issue with bloating, has underlying anxiety followed by psychiatry and recently lost mother.  Started about one to two weeks ago with increased anxiety after loss of mother.  CT abdomen on 01/02/22 noted no acute abnormalities. ? ?Last BM was Thursday morning with some diarrhea.  Does strain on occasion. ?Duration:weeks ?Alleviating factors: nothing ?Aggravating factors: unknown ?Status: fluctuating ?Treatments attempted: none ?Fever: no ?Nausea: no ?Vomiting: no ?Weight loss: no ?Decreased appetite: no ?Diarrhea: no ?Constipation: yes ?Blood in stool: no ?Heartburn: no ?Jaundice: no ? ?COPD ?Continues to smoke -- was smoking 1 1/2 PPD and now < 1 PPD, has been smoking since age 75.  She is currently finishing Augmentin and Prednisone for a sinus infection.  Feels her allergies continue to cause these. ? ?Continues on Stiolto daily and Albuterol as needed.  Had lung CT CA screening 11/12/21, which was benign, but did note aortic atherosclerosis and emphysema.   ?COPD status: stable ?Satisfied with current treatment?: yes ?Oxygen use: no ?Dyspnea frequency: none ?Cough frequency: occasional ?Rescue inhaler frequency:  a few times a month ?Limitation of activity:  no ?Productive cough: none ?Last Spirometry: 03/22/2019 ?Pneumovax: Up to Date ?Influenza: Up to Date  ?  ?PSORIATIC ARTHRITIS: ?Followed by rheumatology at [facility] and last seen 09/18/21 and dermatology last 09/23/21.  She continues on Methotrexate weekly, Humira every two weeks, and folic acid.  This regimen has offered some benefit.   ?   ?FIBROMYALGIA ?Continues on Duloxetine + Gabapentin 300 MG TID.   ?Pain status: stable ?Satisfied with current treatment?: yes ?Previous pain specialty evaluation: no ?Non-narcotic analgesic meds: yes ?Narcotic contract:no ?Treatments attempted: rest, APAP, ibuprofen and physical therapy   ?  ?DEPRESSION/PTSD ?Was followed by psychiatry team, [facility] in [place] -- but no longer sees them due to poor rapport. Last saw them 2 months ago.  Is seeing therapy at this location, who she has a good rapport with -- she plans to follow-up with them.  Does want new psychiatrist.  Feeling very anxious with recent loss of mother -- feels like she needs something for nerves.  Is out of Klonopin -- last fill for 7 days on 12/10/21. ? ?Currently taking Doxepin at bedtime, Strattera, and Olanzapine (she is not sure if taking this). ? ?Previously has taken multiple medications: Vyvanse, Trazodone, and Klonopin. ?Mood status: exacerbated ?Satisfied with current treatment?: yes ?Symptom severity: moderate ?Duration of current treatment : chronic ?Side effects: no ?Medication compliance: good compliance ?Psychotherapy/counseling: yes current ?Depressed mood: yes ?Anxious mood: yes ?Anhedonia: no ?Significant weight loss or gain: no ?Insomnia: yes hard to stay asleep ?Fatigue: yes ?Feelings  of worthlessness or guilt: yes ?Impaired concentration/indecisiveness: yes ?Suicidal ideations: no ?Hopelessness: yes ?Crying spells: no ? ?  01/06/2022  ? 10:07 AM 01/02/2022  ?  2:29 PM 12/10/2021  ?  2:37 PM 11/01/2021  ?  1:04 PM 09/06/2021  ?  1:10 PM  ?Depression screen PHQ 2/9  ?Decreased Interest '3 3 2 3  3  '$ ?Down, Depressed, Hopeless '3 3 3 3 3  '$ ?PHQ - 2 Score '6 6 5 6 6  '$ ?Altered sleeping '3 3 2 3 1  '$ ?Tired, decreased energy '3 3 3 3 2  '$ ?Change in appetite '3 3 1 3 2  '$ ?Feeling bad or failure about yourself  2 2 0 1 2  ?Trouble concentrating '3 3 3 3 3  '$ ?Moving slowly or fidgety/restless '2 2 2 2 3  '$ ?Suicidal thoughts 0 0 0 0 0  ?PHQ-9 Score '22 22 16 21 19  '$ ?Difficult doing work/chores Very difficult Very difficult Very difficult Somewhat difficult Very difficult  ?  ? ?  01/06/2022  ? 10:07 AM 01/02/2022  ?  2:30 PM 12/10/2021  ?  2:37 PM 11/01/2021  ?  1:04 PM  ?GAD 7 : Generalized Anxiety Score  ?Nervous, Anxious, on Edge '3 3 3 2  '$ ?Control/stop worrying '3 3 3 3  '$ ?Worry too much - different things '3 3 3 3  '$ ?Trouble relaxing '3 3 3 3  '$ ?Restless '2 3 3 3  '$ ?Easily annoyed or irritable '2 2 2 1  '$ ?Afraid - awful might happen '1 2 1 1  '$ ?Total GAD 7 Score '17 19 18 16  '$ ?Anxiety Difficulty Very difficult Very difficult Very difficult Somewhat difficult  ? ?Relevant past medical, surgical, family and social history reviewed and updated as indicated. Interim medical history since our last visit reviewed. ?Allergies and medications reviewed and updated. ? ?Review of Systems  ?Constitutional:  Negative for activity change, appetite change, diaphoresis, fatigue and fever.  ?Respiratory:  Positive for cough and wheezing. Negative for chest tightness and shortness of breath.   ?Cardiovascular:  Negative for chest pain, palpitations and leg swelling.  ?Gastrointestinal:  Positive for abdominal distention and constipation. Negative for abdominal pain, diarrhea, nausea and vomiting.  ?Endocrine: Negative for cold intolerance and heat intolerance.  ?Neurological: Negative.   ?Psychiatric/Behavioral: Negative.    ? ?Per HPI unless specifically indicated above ? ?   ?Objective:  ?  ?BP 128/86   Pulse 81   Temp 98.1 ?F (36.7 ?C) (Oral)   Ht 5' 3.74" (1.619 m)   Wt 148 lb (67.1 kg)   SpO2 97%   BMI 25.61 kg/m?   ?Wt Readings from Last 3  Encounters:  ?01/06/22 148 lb (67.1 kg)  ?01/02/22 143 lb 4.8 oz (65 kg)  ?01/01/22 144 lb 6.4 oz (65.5 kg)  ?  ?Physical Exam ?Vitals and nursing note reviewed.  ?Constitutional:   ?   General: She is awake. She is not in acute distress. ?   Appearance: She is well-developed and well-groomed. She is not ill-appearing.  ?HENT:  ?   Head: Normocephalic.  ?   Right Ear: Hearing normal.  ?   Left Ear: Hearing normal.  ?Eyes:  ?   General: Lids are normal.     ?   Right eye: No discharge.     ?   Left eye: No discharge.  ?   Conjunctiva/sclera: Conjunctivae normal.  ?   Pupils: Pupils are equal, round, and reactive to light.  ?Neck:  ?   Thyroid: No  thyromegaly.  ?   Vascular: No carotid bruit.  ?Cardiovascular:  ?   Rate and Rhythm: Normal rate and regular rhythm.  ?   Heart sounds: Normal heart sounds. No murmur heard. ?  No gallop.  ?Pulmonary:  ?   Effort: Pulmonary effort is normal. No accessory muscle usage or respiratory distress.  ?   Breath sounds: Normal breath sounds.  ?Abdominal:  ?   General: Bowel sounds are normal. There is distension (mild).  ?   Palpations: Abdomen is soft. There is no hepatomegaly.  ?   Tenderness: There is no abdominal tenderness. There is no right CVA tenderness or left CVA tenderness.  ?   Hernia: No hernia is present.  ?Musculoskeletal:  ?   Cervical back: Normal range of motion and neck supple.  ?   Right lower leg: No edema.  ?   Left lower leg: No edema.  ?Skin: ?   General: Skin is warm and dry.  ?Neurological:  ?   Mental Status: She is alert and oriented to person, place, and time.  ?Psychiatric:     ?   Attention and Perception: Attention normal.     ?   Mood and Affect: Mood normal.     ?   Speech: Speech normal.     ?   Behavior: Behavior normal. Behavior is cooperative.     ?   Thought Content: Thought content normal.  ? ?Results for orders placed or performed in visit on 01/02/22  ?CBC With Differential/Platelet  ?Result Value Ref Range  ? WBC 11.4 (H) 3.4 - 10.8  x10E3/uL  ? RBC 4.18 3.77 - 5.28 x10E6/uL  ? Hemoglobin 14.3 11.1 - 15.9 g/dL  ? Hematocrit 41.5 34.0 - 46.6 %  ? MCV 99 (H) 79 - 97 fL  ? MCH 34.2 (H) 26.6 - 33.0 pg  ? MCHC 34.5 31.5 - 35.7 g/dL  ? RDW 15.8 (H) 11.7 - 15.4 %  ? P

## 2022-01-06 NOTE — Assessment & Plan Note (Signed)
I have recommended complete cessation of tobacco use. I have discussed various options available for assistance with tobacco cessation including over the counter methods (Nicotine gum, patch and lozenges). We also discussed prescription options (Chantix, Nicotine Inhaler / Nasal Spray). The patient is not interested in pursuing any prescription tobacco cessation options at this time.  

## 2022-01-06 NOTE — Assessment & Plan Note (Addendum)
Ongoing issue, suspect related to her underlying anxiety and PTSD.  Currently exacerbated by recent loss of her mother.  At this this time recommend she start daily Metamucil and add on a Senna S as needed for constipation, if diarrhea presents then hold off on Senna.  Will start Bentyl to take before meals due to bloating and assess if benefit.  Recommend heavy focus on diet changes with more fiber and water intake.  Labs today: CBC, CMP, TSH, and Celiac. ?

## 2022-01-06 NOTE — Assessment & Plan Note (Signed)
Chronic, ongoing.  Continue Duloxetine for mood and pain, adjust medications as needed.   

## 2022-01-06 NOTE — Assessment & Plan Note (Addendum)
Chronic, ongoing.  Continue current medication regimen and collaboration with therapist.  New referral to psychiatry placed as poor rapport with current provider and would like a new psychiatrist.   Denies SI/HI.  At this time will provide her a short burst of Klonopin to take as needed.   ?

## 2022-01-06 NOTE — Assessment & Plan Note (Signed)
Continue collaboration with rheumatology & dermatology at [facility] and current medication regimen.  Reviewed recent notes.  Will obtain CBC and CMP today. ?

## 2022-01-06 NOTE — Assessment & Plan Note (Signed)
PCP discussed at length with patient and recommended complete cessation of smoking and educated patient on risk with smoking. Patient refuses at this time. Consider addition of statin and ASA in future. ?

## 2022-01-07 ENCOUNTER — Encounter: Payer: Self-pay | Admitting: Nurse Practitioner

## 2022-01-07 MED ORDER — AZITHROMYCIN 250 MG PO TABS: ORAL_TABLET | ORAL | 0 refills | Status: AC

## 2022-01-07 NOTE — Progress Notes (Signed)
Contacted via [place] ? ? ?Good evening Ruta, your labs have almost all returned, still waiting on a Celiac lab.  CBC shows improvement in white blood cell count from previous, lymphocytes are mildly elevated but this could be related to current illness.  Kidney function, creatinine and eGFR, remains normal, as is liver function, AST and ALT.  Thyroid lab normal and Celiac labs thus far are normal.  Any questions? ?Keep being stellar!!  Thank you for allowing me to participate in your care.  I appreciate you. ?Kindest regards, ?Shavonda Wiedman ?

## 2022-01-07 NOTE — Addendum Note (Signed)
Addended by: Marnee Guarneri T on: 01/07/2022 12:59 PM ? ? Modules accepted: Orders ? ?

## 2022-01-08 LAB — TSH: TSH: 1.38 u[IU]/mL (ref 0.450–4.500)

## 2022-01-08 LAB — COMPREHENSIVE METABOLIC PANEL
ALT: 13 IU/L (ref 0–32)
AST: 14 IU/L (ref 0–40)
Albumin/Globulin Ratio: 1.8 (ref 1.2–2.2)
Albumin: 4 g/dL (ref 3.8–4.9)
Alkaline Phosphatase: 94 IU/L (ref 44–121)
BUN/Creatinine Ratio: 15 (ref 9–23)
BUN: 11 mg/dL (ref 6–24)
Bilirubin Total: 0.2 mg/dL (ref 0.0–1.2)
CO2: 23 mmol/L (ref 20–29)
Calcium: 9.4 mg/dL (ref 8.7–10.2)
Chloride: 104 mmol/L (ref 96–106)
Creatinine, Ser: 0.73 mg/dL (ref 0.57–1.00)
Globulin, Total: 2.2 g/dL (ref 1.5–4.5)
Glucose: 76 mg/dL (ref 70–99)
Potassium: 3.6 mmol/L (ref 3.5–5.2)
Sodium: 144 mmol/L (ref 134–144)
Total Protein: 6.2 g/dL (ref 6.0–8.5)
eGFR: 95 mL/min/{1.73_m2} (ref >59)

## 2022-01-08 LAB — CBC WITH DIFFERENTIAL/PLATELET
Basophils Absolute: 0 10*3/uL (ref 0.0–0.2)
Basos: 0 %
EOS (ABSOLUTE): 0 10*3/uL (ref 0.0–0.4)
Eos: 0 %
Hematocrit: 40.8 % (ref 34.0–46.6)
Hemoglobin: 13.8 g/dL (ref 11.1–15.9)
Immature Grans (Abs): 0 10*3/uL (ref 0.0–0.1)
Immature Granulocytes: 0 %
Lymphocytes Absolute: 4 10*3/uL — ABNORMAL HIGH (ref 0.7–3.1)
Lymphs: 41 %
MCH: 33.8 pg — ABNORMAL HIGH (ref 26.6–33.0)
MCHC: 33.8 g/dL (ref 31.5–35.7)
MCV: 100 fL — ABNORMAL HIGH (ref 79–97)
Monocytes Absolute: 0.2 10*3/uL (ref 0.1–0.9)
Monocytes: 2 %
Neutrophils Absolute: 5.6 10*3/uL (ref 1.4–7.0)
Neutrophils: 57 %
Platelets: 278 10*3/uL (ref 150–450)
RBC: 4.08 x10E6/uL (ref 3.77–5.28)
RDW: 14.4 % (ref 11.7–15.4)
WBC: 9.8 10*3/uL (ref 3.4–10.8)

## 2022-01-08 LAB — CELIAC DISEASE PANEL
Endomysial IgA: NEGATIVE
IgA/Immunoglobulin A, Serum: 234 mg/dL (ref 87–352)
Transglutaminase IgA: <2 U/mL (ref 0–3)

## 2022-01-08 NOTE — Progress Notes (Signed)
Contacted via [place] ? ? ?Good morning, Celiac labs are all normal:)

## 2022-01-14 DIAGNOSIS — Z79631 Methotrexate, long term, current use: Principal | ICD-10-CM

## 2022-01-14 DIAGNOSIS — L4050 Arthropathic psoriasis, unspecified: Principal | ICD-10-CM

## 2022-01-14 MED ORDER — METHOTREXATE SODIUM 25 MG/ML INJECTION SOLUTION
SUBCUTANEOUS | 0 refills | 84 days
Start: 2022-01-14 — End: 2022-04-14

## 2022-01-14 NOTE — Unmapped (Signed)
Methotrexate refill  Last Visit Date: 09/18/2021  Next Visit Date: 03/24/2022    Lab Results   Component Value Date    ALT 12 09/18/2021    AST 16 09/18/2021    ALBUMIN 3.7 09/10/2019    CREATININE 0.68 09/18/2021     Lab Results   Component Value Date    WBC 7.2 09/18/2021    HGB 13.9 09/18/2021    HCT 41.7 09/18/2021    PLT 283 09/18/2021     Lab Results   Component Value Date    NEUTROPCT 63.8 09/18/2021    LYMPHOPCT 30.5 09/18/2021    MONOPCT 2.9 09/18/2021    EOSPCT 1.9 09/18/2021    BASOPCT 0.9 09/18/2021

## 2022-01-15 MED ORDER — METHOTREXATE SODIUM 25 MG/ML INJECTION SOLUTION
SUBCUTANEOUS | 0 refills | 84 days | Status: CP
Start: 2022-01-15 — End: 2022-04-15

## 2022-01-17 NOTE — Unmapped (Signed)
[facility] Refill Coordination Note    Specialty Medication(s) to be Shipped:   Inflammatory Disorders: Humira    Other medication(s) to be shipped: No additional medications requested for fill at this time     [name], DOB: 1960/10/19  Phone: [phone] (home)       All above HIPAA information was verified with patient.     Was a Nurse, learning disability used for this call? No    Completed refill call assessment today to schedule patient's medication shipment from the [facility] [phone]).  All relevant notes have been reviewed.     Specialty medication(s) and dose(s) confirmed: Regimen is correct and unchanged.   Changes to medications: [name] reports no changes at this time.  Changes to insurance: No  New side effects reported not previously addressed with a pharmacist or physician: None reported  Questions for the pharmacist: No    Confirmed patient received a Conservation officer, historic buildings and a Surveyor, mining with first shipment. The patient will receive a drug information handout for each medication shipped and additional FDA Medication Guides as required.       DISEASE/MEDICATION-SPECIFIC INFORMATION      For patients on injectable medications: Patient currently has 0 doses left.  Next injection is scheduled for 01/24/22.    SPECIALTY MEDICATION ADHERENCE     Medication Adherence    Patient reported X missed doses in the last month: 0  Specialty Medication: humira 40MG /0.46mL  Informant: patient              Were doses missed due to medication being on hold? No    Humira 40mg /0.49ml: Patient has 0 days of medication on hand      REFERRAL TO PHARMACIST     Referral to the pharmacist: Not needed      [facility]     Shipping address confirmed in Epic.     Delivery Scheduled: Yes, Expected medication delivery date: 01/21/22.     Medication will be delivered via Same Day Courier to the prescription address in Epic [place].    Wyatt Mage M Elisabeth Cara   [facility] Specialty Technician

## 2022-01-20 ENCOUNTER — Ambulatory Visit
Admission: EM | Admit: 2022-01-20 | Discharge: 2022-01-20 | Disposition: A | Discharge status: Home / Self Care | Payer: 59 | Attending: Internal Medicine | Admitting: Internal Medicine

## 2022-01-20 DIAGNOSIS — T7840XA Allergy, unspecified, initial encounter: Secondary | ICD-10-CM | POA: Diagnosis not present

## 2022-01-20 MED ORDER — PREDNISONE 20 MG PO TABS: 20.0000 mg | ORAL_TABLET | Freq: Every day | ORAL | 0 refills | Status: DC

## 2022-01-20 NOTE — Discharge Instructions (Signed)
PLEASE STOP COLORING YOUR HAIR, IT IS TOXIC TO YOUR SKIN ?FIND NATURAL HAIR COLOR INSTEAD IF YOU DONT WANT TO GO WITHOUT COLORING IT ?

## 2022-01-20 NOTE — ED Triage Notes (Signed)
Pt got her hair colored and believes she is having an allergic reaction to the color. Pt is having swelling along her scalp and ears. x4days ? ?Pt took OTC benadryl and states that it is not helping.  ? ?Pt knows shes allergic to hair dye. Pt states that she is given Prednisone and it clears up the itching.  ?

## 2022-01-20 NOTE — ED Provider Notes (Signed)
?[facility] ? ? ? ?CSN: [id number] ?Arrival date & time: 01/20/22  1507 ? ? ?  ? ?History   ?Chief Complaint ?Chief Complaint  ?Patient presents with  ? Allergic Reaction  ? ? ?HPI ?Emily Johnston is a 61 y.o. female who presents with scalp local allergic reation to hair die. She is allergic to hair die but colors her hair anyway. Her external ears are red and itching ,and her scalp feels burning sensation. This started 4 days ago.  ? ? ? ?Past Medical History:  ?Diagnosis Date  ? Anxiety   ? Arthritis   ? COPD (chronic obstructive pulmonary disease) ([place])   ? Depression   ? Fibromyalgia   ? IBS (irritable bowel syndrome)   ? PTSD (post-traumatic stress disorder)   ? ? ?Patient Active Problem List  ? Diagnosis Date Noted  ? Elevated low density lipoprotein (LDL) cholesterol level 05/04/2021  ? Family history of breast cancer in mother 10/29/2020  ? Back pain 10/11/2020  ? Plantar fasciitis 07/10/2020  ? Basal cell carcinoma (BCC) 07/10/2020  ? Spondylosis of cervical spine with myelopathy and radiculopathy 10/24/2019  ? Aortic atherosclerosis ([place]) 06/15/2019  ? Psoriatic arthritis ([place]) 03/22/2019  ? Centrilobular emphysema ([place]) 02/22/2019  ? Nicotine dependence, cigarettes, w unsp disorders 02/22/2019  ? IBS (irritable bowel syndrome) 11/01/2018  ? Severe recurrent major depression without psychotic features ([place]) 01/12/2018  ? PTSD (post-traumatic stress disorder) 01/12/2018  ? Fibromyalgia 01/12/2018  ? ? ?Past Surgical History:  ?Procedure Laterality Date  ? ABDOMINAL HYSTERECTOMY    ? CARPAL TUNNEL RELEASE    ? KNEE SURGERY    ? ? ?OB History   ?No obstetric history on file. ?  ? ? ? ?Home Medications   ? ?Prior to Admission medications   ?Medication Sig Start Date End Date Taking? Authorizing Provider  ?acitretin (SORIATANE) 25 MG capsule Take 1 capsule by mouth daily. 04/30/20  Yes [provider]  ?albuterol (VENTOLIN HFA) 108 (90 Base) MCG/ACT inhaler Inhale 2 puffs into the lungs  every 6 (six) hours as needed for wheezing or shortness of breath. 09/04/20  Yes Johnson, Megan P, DO  ?atomoxetine (STRATTERA) 40 MG capsule Take 40 mg by mouth every morning. 09/11/21  Yes [provider]  ?clobetasol ointment (TEMOVATE) 5.09 % Apply 1 application. topically 2 (two) times daily. 09/24/21  Yes [provider]  ?clonazePAM (KLONOPIN) 0.5 MG tablet Take 1 tablet (0.5 mg total) by mouth 2 (two) times daily as needed for anxiety. 01/06/22  Yes Cannady, Henrine Screws T, NP  ?dicyclomine (BENTYL) 10 MG capsule Take 1 capsule (10 mg total) by mouth 3 (three) times daily before meals. 01/06/22  Yes Cannady, [name] T, NP  ?doxepin (SINEQUAN) 10 MG capsule Take 10 mg by mouth at bedtime. 11/28/21  Yes [provider]  ?fluticasone (FLONASE) 50 MCG/ACT nasal spray Place 2 sprays into both nostrils daily. 10/22/21  Yes Cannady, Henrine Screws T, NP  ?folic acid (FOLVITE) 1 MG tablet Take 1 mg by mouth daily.   Yes [provider]  ?gabapentin (NEURONTIN) 300 MG capsule Take 300 mg by mouth 3 (three) times daily.   Yes [provider]  ?lactobacillus acidophilus & bulgar (LACTINEX) chewable tablet Chew 1 tablet by mouth 3 (three) times daily with meals. 12/10/21  Yes Vigg, Avanti, MD  ?meclizine (ANTIVERT) 12.5 MG tablet Take 1 tablet (12.5 mg total) by mouth 3 (three) times daily as needed for dizziness. 09/04/20  Yes Johnson, Megan P, DO  ?  methotrexate 250 MG/10ML injection Inject 25 mg into the vein once a week.   Yes [provider]  ?montelukast (SINGULAIR) 10 MG tablet Take 1 tablet (10 mg total) by mouth at bedtime. 01/06/22  Yes Venita Lick, NP  ?Multiple Vitamin (MULTIVITAMIN) tablet Take 1 tablet by mouth daily.   Yes [provider]  ?OLANZapine (ZYPREXA) 5 MG tablet Take 2.5 mg by mouth 2 (two) times daily. 10/08/20  Yes [provider]  ?Olopatadine HCl 0.2 % SOLN Apply 1 drop to eye daily. 01/01/22  Yes Melynda Ripple, MD  ?predniSONE  (DELTASONE) 20 MG tablet Take 1 tablet (20 mg total) by mouth daily with breakfast. 01/20/22  Yes Rodriguez-Southworth, Sunday Spillers, PA-C  ?Spacer/Aero-Holding Chambers (AEROCHAMBER PLUS) inhaler Use with inhaler 01/01/22  Yes Melynda Ripple, MD  ?STIOLTO RESPIMAT 2.5-2.5 MCG/ACT AERS INHALE 2 PUFFS BY MOUTH INTO THE LUNGS ONCE DAILY 01/21/21  Yes Venita Lick, NP  ?Adalimumab (HUMIRA PEN) 40 MG/0.4ML PNKT Inject into the skin. 02/10/20 07/22/21  [provider]  ?DULoxetine (CYMBALTA) 60 MG capsule Take 2 capsules by mouth daily. 10/25/18 01/06/22  [provider]  ? ? ?Family History ?History reviewed. No pertinent family history. ? ?Social History ?Social History  ? ?Tobacco Use  ? Smoking status: Every Day  ?  Packs/day: 1.00  ?  Years: 41.00  ?  Pack years: 41.00  ?  Types: Cigarettes  ? Smokeless tobacco: Never  ?Vaping Use  ? Vaping Use: Never used  ?Substance Use Topics  ? Alcohol use: No  ? Drug use: No  ? ? ? ?Allergies   ?Patient has no known allergies. ? ? ?Review of Systems ?Review of Systems  ?HENT:  Negative for sore throat and trouble swallowing.   ?Respiratory:  Negative for shortness of breath and wheezing.   ?Skin:  Positive for rash.  ?     + itching  ? ? ?Physical Exam ?Triage Vital Signs ?ED Triage Vitals  ?Enc Vitals Group  ?   BP 01/20/22 1534 116/88  ?   Pulse Rate 01/20/22 1534 82  ?   Resp 01/20/22 1534 18  ?   Temp 01/20/22 1534 98.5 ?F (36.9 ?C)  ?   Temp Source 01/20/22 1534 Oral  ?   SpO2 01/20/22 1534 96 %  ?   Weight 01/20/22 1532 142 lb (64.4 kg)  ?   Height 01/20/22 1532 '5\' 3"'$  (1.6 m)  ?   Head Circumference --   ?   Peak Flow --   ?   Pain Score 01/20/22 1531 7  ?   Pain Loc --   ?   Pain Edu? --   ?   Excl. in [place]? --   ? ?No data found. ? ?Updated Vital Signs ?BP 116/88 (BP Location: Left Arm)   Pulse 82   Temp 98.5 ?F (36.9 ?C) (Oral)   Resp 18   Ht '5\' 3"'$  (1.6 m)   Wt 142 lb (64.4 kg)   SpO2 96%   BMI 25.15 kg/m?  ? ?Visual Acuity ?Right Eye Distance:   ?Left  Eye Distance:   ?Bilateral Distance:   ? ?Right Eye Near:   ?Left Eye Near:    ?Bilateral Near:    ? ?Physical Exam ?Vitals and nursing note reviewed.  ?Constitutional:   ?   General: She is not in acute distress. ?   Appearance: She is not toxic-appearing.  ?HENT:  ?   Ears:  ?   Comments: Top  borders of ears and mildly red, seems worse behind her ears, and border of her scalp on temples, forehead and behind ears.  ?Eyes:  ?   Conjunctiva/sclera: Conjunctivae normal.  ?Pulmonary:  ?   Effort: Pulmonary effort is normal.  ?Musculoskeletal:  ?   Cervical back: Neck supple.  ?Neurological:  ?   Mental Status: She is alert and oriented to person, place, and time.  ?Psychiatric:     ?   Mood and Affect: Mood normal.     ?   Behavior: Behavior normal.     ?   Thought Content: Thought content normal.     ?   Judgment: Judgment normal.  ? ? ? ?UC Treatments / Results  ?Labs ?(all labs ordered are listed, but only abnormal results are displayed) ?Labs Reviewed - No data to display ? ?EKG ? ? ?Radiology ?No results found. ? ?Procedures ?Procedures (including critical care time) ? ?Medications Ordered in UC ?Medications - No data to display ? ?Initial Impression / Assessment and Plan / UC Course  ?I have reviewed the triage vital signs and the nursing notes. ?Pt advised strongly to stop coloring her hair and look for other options. See instructions ?I placed her on Prednisone as noted.  ? ? ? ?Final Clinical Impressions(s) / UC Diagnoses  ? ?Final diagnoses:  ?Allergic reaction, initial encounter  ? ? ? ?Discharge Instructions   ? ?  ?PLEASE STOP COLORING YOUR HAIR, IT IS TOXIC TO YOUR SKIN ?FIND NATURAL HAIR COLOR INSTEAD IF YOU DONT WANT TO GO WITHOUT COLORING IT ? ? ? ? ?ED Prescriptions   ? ? Medication Sig Dispense Auth. Provider  ? predniSONE (DELTASONE) 20 MG tablet Take 1 tablet (20 mg total) by mouth daily with breakfast. 5 tablet Rodriguez-Southworth, Sunday Spillers, PA-C  ? ?  ? ?PDMP not reviewed this encounter. ?   ?Shelby Mattocks, PA-C ?01/20/22 1640 ? ?

## 2022-01-21 MED FILL — HUMIRA PEN CITRATE FREE 40 MG/0.4 ML: SUBCUTANEOUS | 28 days supply | Qty: 2 | Fill #3

## 2022-01-25 ENCOUNTER — Other Ambulatory Visit: Payer: Self-pay | Admitting: Nurse Practitioner

## 2022-01-27 NOTE — Telephone Encounter (Signed)
Requested medication (s) are due for refill today: yes ? ?Requested medication (s) are on the active medication list: yes ? ?Last refill:  01/21/21 16 g 4 RF ? ?Future visit scheduled: yes ? ?Notes to clinic:  medication not assigned to a protocol ? ? ?Requested Prescriptions  ?Pending Prescriptions Disp Refills  ? STIOLTO RESPIMAT 2.5-2.5 MCG/ACT AERS [Pharmacy Med Name: STIOLTO RESPIMAT 2.5/2.5MCG INH 4GM] 16 g 4  ?  Sig: INHALE 2 PUFFS BY MOUTH EVERY DAY  ?  ? Off-Protocol Failed - 01/25/2022  3:16 AM  ?  ?  Failed - Medication not assigned to a protocol, review manually.  ?  ?  Passed - Valid encounter within last 12 months  ?  Recent Outpatient Visits   ? ?      ? 3 weeks ago Centrilobular emphysema ([place])  ? [place], Gracey T, NP  ? 3 weeks ago Functional diarrhea  ? [place], MD  ? 1 month ago Anxiety  ? [facility] Vigg, Avanti, MD  ? 2 months ago Centrilobular emphysema ([place])  ? [facility] Foss, [place] T, NP  ? 4 months ago Allergic contact dermatitis due to dyes  ? [facility], Lauren A, NP  ? ?  ?  ?Future Appointments   ? ?        ? In 1 week Venita Lick, NP [facility]  ? In 3 months Cannady, Barbaraann Faster, NP [facility]  ? ?  ? ? ?  ?  ?  ? ? ? ? ?

## 2022-02-02 NOTE — Patient Instructions (Addendum)
Psychiatry at [facility] = [phone]  Constipation, Adult Constipation is when a person has trouble pooping (having a bowel movement). When you have this condition, you may poop fewer than 3 times a week. Your poop (stool) may also be dry, hard, or bigger than normal. Follow these instructions at home: Eating and drinking  Eat foods that have a lot of fiber, such as: Fresh fruits and vegetables. Whole grains. Beans. Eat less of foods that are low in fiber and high in fat and sugar, such as: [place] fries. Hamburgers. Cookies. Candy. Soda. Drink enough fluid to keep your pee (urine) pale yellow. General instructions Exercise regularly or as told by your doctor. Try to do 150 minutes of exercise each week. Go to the restroom when you feel like you need to poop. Do not hold it in. Take over-the-counter and prescription medicines only as told by your doctor. These include any fiber supplements. When you poop: Do deep breathing while relaxing your lower belly (abdomen). Relax your pelvic floor. The pelvic floor is a group of muscles that support the rectum, bladder, and intestines (as well as the uterus in women). Watch your condition for any changes. Tell your doctor if you notice any. Keep all follow-up visits as told by your doctor. This is important. Contact a doctor if: You have pain that gets worse. You have a fever. You have not pooped for 4 days. You vomit. You are not hungry. You lose weight. You are bleeding from the opening of the butt (anus). You have thin, pencil-like poop. Get help right away if: You have a fever, and your symptoms suddenly get worse. You leak poop or have blood in your poop. Your belly feels hard or bigger than normal (bloated). You have very bad belly pain. You feel dizzy or you faint. Summary Constipation is when a person poops fewer than 3 times a week, has trouble pooping, or has poop that is dry, hard, or bigger than normal. Eat  foods that have a lot of fiber. Drink enough fluid to keep your pee (urine) pale yellow. Take over-the-counter and prescription medicines only as told by your doctor. These include any fiber supplements. This information is not intended to replace advice given to you by your health care provider. Make sure you discuss any questions you have with your health care provider. Document Revised: 07/20/2019 Document Reviewed: 07/20/2019 Elsevier Patient Education  [employer].

## 2022-02-03 ENCOUNTER — Ambulatory Visit (INDEPENDENT_AMBULATORY_CARE_PROVIDER_SITE_OTHER): Payer: 59 | Admitting: Nurse Practitioner

## 2022-02-03 ENCOUNTER — Encounter: Payer: Self-pay | Admitting: Nurse Practitioner

## 2022-02-03 VITALS — BP 107/74 | HR 94 | Temp 98.0°F | Wt 141.4 lb

## 2022-02-03 DIAGNOSIS — F4310 Post-traumatic stress disorder, unspecified: Secondary | ICD-10-CM | POA: Diagnosis not present

## 2022-02-03 DIAGNOSIS — K582 Mixed irritable bowel syndrome: Secondary | ICD-10-CM | POA: Diagnosis not present

## 2022-02-03 DIAGNOSIS — Z1211 Encounter for screening for malignant neoplasm of colon: Secondary | ICD-10-CM | POA: Diagnosis not present

## 2022-02-03 DIAGNOSIS — F332 Major depressive disorder, recurrent severe without psychotic features: Secondary | ICD-10-CM | POA: Diagnosis not present

## 2022-02-03 MED ORDER — CLONAZEPAM 0.5 MG PO TABS: 0.5000 mg | ORAL_TABLET | Freq: Two times a day (BID) | ORAL | 0 refills | Status: DC | PRN

## 2022-02-03 NOTE — Assessment & Plan Note (Signed)
Chronic, ongoing.  Continue current medication regimen and adjust as needed.  New referral to psychiatry placed last visit, she is to call and schedule with psychiatrist -- as they did call her to schedule, provided her with number.   Denies SI/HI.  At this time will continue short Klonopin dosing, but this is only short term until settled with psychiatry.

## 2022-02-03 NOTE — Assessment & Plan Note (Signed)
Ongoing, suspect related to her underlying anxiety and PTSD.  Exacerbated by recent loss of her mother.  At this this time recommend she continue Metamucil daily and Senna S as needed for constipation, if diarrhea presents then hold off on Senna.  Continue Bentyl to take before meals as offering benefit to bloating.  Recommend heavy focus on diet changes with more fiber and water intake.  Recent labs stable.  Referral for colonoscopy, which will offer benefit.

## 2022-02-03 NOTE — Progress Notes (Signed)
BP 107/74   Pulse 94   Temp 98 F (36.7 C) (Oral)   Wt 141 lb 6.4 oz (64.1 kg)   SpO2 98%   BMI 25.05 kg/m    Subjective:    Patient ID: Emily Johnston, female    DOB: 17-Nov-1960, 61 y.o.   MRN: [id number]  HPI: Emily Johnston is a 61 y.o. female  Chief Complaint  Patient presents with   Mood    Patient is here to follow up on Mood.    Constipation    Patient is here to follow up on Constipation. Patient states she was constipated the last couple of days and then this morning she had a bowel movement and it was diarrhea. Patient states she is having some "crampy" sensation similar to menstrual cramps and states she notices some bloating as well.    Medication Management    Patient states she would like to discuss Bentyl prescription as she feels it does not help and she says she does not always take it on schedule with breakfast.    CONSTIPATION Follow-up for abdominal issues since loss of mother.  Continues with bloating, has underlying anxiety followed by psychiatry in past and recently lost mother + her brother is staying with her.  Was started on Bentyl last visit, not feeling as bloated with this on board.  CT abdomen on 01/02/22 noted no acute abnormalities.  Last BM was this morning with some diarrhea, 2 days before this was constipated with no bowel movement.  Has not had a normal bowel movement.  Has not had a colonoscopy.  Continues to smoke, has cut back to <1/2 PPD. Duration:weeks Alleviating factors: nothing Aggravating factors: unknown Status: fluctuating Treatments attempted: none Fever: no Nausea: no Vomiting: no Weight loss: no Decreased appetite: no Diarrhea: occasional Constipation: occasional Blood in stool: no Heartburn: no Jaundice: no  DEPRESSION/PTSD Was followed by psychiatry team, [facility] in [place] -- but no longer sees them due to poor relationship with provider -- a new referral was placed last visit to [facility],  she will call to schedule. Last saw psychiatry about 3 months ago.  Feeling very anxious with recent loss of mother -- feels like she needs something for nerves.  Is out of Klonopin -- last fill for 60 days on 01/06/22 -- is taking twice a day as ordered.  Currently taking Olanzapine at night for sleep.  Does not think she is taking Strattera and Doxepin.    Previously has taken multiple medications: Vyvanse, Trazodone, and Klonopin. Mood status: chronic with exacerbation Satisfied with current treatment?: yes Symptom severity: moderate Duration of current treatment : chronic Side effects: no Medication compliance: good compliance Psychotherapy/counseling: yes current Depressed mood: yes Anxious mood: yes Anhedonia: no Significant weight loss or gain: no Insomnia: yes hard to stay asleep Fatigue: yes Feelings of worthlessness or guilt: yes Impaired concentration/indecisiveness: yes Suicidal ideations: no Hopelessness: yes Crying spells: no    02/03/2022    3:02 PM 01/06/2022   10:07 AM 01/02/2022    2:29 PM 12/10/2021    2:37 PM 11/01/2021    1:04 PM  Depression screen PHQ 2/9  Decreased Interest 2 3 3 2 3   Down, Depressed, Hopeless 3 3 3 3 3   PHQ - 2 Score 5 6 6 5 6   Altered sleeping 2 3 3 2 3   Tired, decreased energy 3 3 3 3 3   Change in appetite 1 3 3 1 3   Feeling bad or failure about yourself  1 2 2  0 1  Trouble concentrating 3 3 3 3 3   Moving slowly or fidgety/restless 2 2 2 2 2   Suicidal thoughts 0 0 0 0 0  PHQ-9 Score 17 22 22 16 21   Difficult doing work/chores  Very difficult Very difficult Very difficult Somewhat difficult       02/03/2022    3:02 PM 01/06/2022   10:07 AM 01/02/2022    2:30 PM 12/10/2021    2:37 PM  GAD 7 : Generalized Anxiety Score  Nervous, Anxious, on Edge 3 3 3 3   Control/stop worrying 2 3 3 3   Worry too much - different things 3 3 3 3   Trouble relaxing 3 3 3 3   Restless 3 2 3 3   Easily annoyed or irritable 2 2 2 2   Afraid - awful might  happen 1 1 2 1   Total GAD 7 Score 17 17 19 18   Anxiety Difficulty Very difficult Very difficult Very difficult Very difficult   Relevant past medical, surgical, family and social history reviewed and updated as indicated. Interim medical history since our last visit reviewed. Allergies and medications reviewed and updated.  Review of Systems  Constitutional:  Negative for activity change, appetite change, diaphoresis, fatigue and fever.  Respiratory:  Negative for cough, chest tightness, shortness of breath and wheezing.   Cardiovascular:  Negative for chest pain, palpitations and leg swelling.  Gastrointestinal:  Positive for abdominal pain, constipation and diarrhea. Negative for abdominal distention, nausea and vomiting.  Endocrine: Negative for cold intolerance and heat intolerance.  Neurological: Negative.   Psychiatric/Behavioral: Negative.     Per HPI unless specifically indicated above     Objective:    BP 107/74   Pulse 94   Temp 98 F (36.7 C) (Oral)   Wt 141 lb 6.4 oz (64.1 kg)   SpO2 98%   BMI 25.05 kg/m   Wt Readings from Last 3 Encounters:  02/03/22 141 lb 6.4 oz (64.1 kg)  01/20/22 142 lb (64.4 kg)  01/06/22 148 lb (67.1 kg)    Physical Exam Vitals and nursing note reviewed.  Constitutional:      General: She is awake. She is not in acute distress.    Appearance: She is well-developed and well-groomed. She is not ill-appearing.  HENT:     Head: Normocephalic.     Right Ear: Hearing normal.     Left Ear: Hearing normal.  Eyes:     General: Lids are normal.        Right eye: No discharge.        Left eye: No discharge.     Conjunctiva/sclera: Conjunctivae normal.     Pupils: Pupils are equal, round, and reactive to light.  Neck:     Thyroid: No thyromegaly.     Vascular: No carotid bruit.  Cardiovascular:     Rate and Rhythm: Normal rate and regular rhythm.     Heart sounds: Normal heart sounds. No murmur heard.   No gallop.  Pulmonary:      Effort: Pulmonary effort is normal. No accessory muscle usage or respiratory distress.     Breath sounds: Normal breath sounds.  Abdominal:     General: Bowel sounds are normal. There is distension (mild).     Palpations: Abdomen is soft. There is no hepatomegaly.     Tenderness: There is no abdominal tenderness. There is no right CVA tenderness or left CVA tenderness.     Hernia: No hernia is present.  Musculoskeletal:  Cervical back: Normal range of motion and neck supple.     Right lower leg: No edema.     Left lower leg: No edema.  Skin:    General: Skin is warm and dry.  Neurological:     Mental Status: She is alert and oriented to person, place, and time.  Psychiatric:        Attention and Perception: Attention normal.        Mood and Affect: Mood normal.        Speech: Speech normal.        Behavior: Behavior normal. Behavior is cooperative.        Thought Content: Thought content normal.   Results for orders placed or performed in visit on 01/06/22  CBC with Differential/Platelet  Result Value Ref Range   WBC 9.8 3.4 - 10.8 x10E3/uL   RBC 4.08 3.77 - 5.28 x10E6/uL   Hemoglobin 13.8 11.1 - 15.9 g/dL   Hematocrit 40.8 34.0 - 46.6 %   MCV 100 (H) 79 - 97 fL   MCH 33.8 (H) 26.6 - 33.0 pg   MCHC 33.8 31.5 - 35.7 g/dL   RDW 14.4 11.7 - 15.4 %   Platelets 278 150 - 450 x10E3/uL   Neutrophils 57 Not Estab. %   Lymphs 41 Not Estab. %   Monocytes 2 Not Estab. %   Eos 0 Not Estab. %   Basos 0 Not Estab. %   Neutrophils Absolute 5.6 1.4 - 7.0 x10E3/uL   Lymphocytes Absolute 4.0 (H) 0.7 - 3.1 x10E3/uL   Monocytes Absolute 0.2 0.1 - 0.9 x10E3/uL   EOS (ABSOLUTE) 0.0 0.0 - 0.4 x10E3/uL   Basophils Absolute 0.0 0.0 - 0.2 x10E3/uL   Immature Granulocytes 0 Not Estab. %   Immature Grans (Abs) 0.0 0.0 - 0.1 x10E3/uL  Comprehensive metabolic panel  Result Value Ref Range   Glucose 76 70 - 99 mg/dL   BUN 11 6 - 24 mg/dL   Creatinine, Ser 0.73 0.57 - 1.00 mg/dL   eGFR 95 >59  mL/min/1.73   BUN/Creatinine Ratio 15 9 - 23   Sodium 144 134 - 144 mmol/L   Potassium 3.6 3.5 - 5.2 mmol/L   Chloride 104 96 - 106 mmol/L   CO2 23 20 - 29 mmol/L   Calcium 9.4 8.7 - 10.2 mg/dL   Total Protein 6.2 6.0 - 8.5 g/dL   Albumin 4.0 3.8 - 4.9 g/dL   Globulin, Total 2.2 1.5 - 4.5 g/dL   Albumin/Globulin Ratio 1.8 1.2 - 2.2   Bilirubin Total 0.2 0.0 - 1.2 mg/dL   Alkaline Phosphatase 94 44 - 121 IU/L   AST 14 0 - 40 IU/L   ALT 13 0 - 32 IU/L  TSH  Result Value Ref Range   TSH 1.380 0.450 - 4.500 uIU/mL  Celiac Disease Panel  Result Value Ref Range   Endomysial IgA Negative Negative   Transglutaminase IgA <2 0 - 3 U/mL   IgA/Immunoglobulin A, Serum 234 87 - 352 mg/dL      Assessment & Plan:   Problem List Items Addressed This Visit       Digestive   IBS (irritable bowel syndrome)    Ongoing, suspect related to her underlying anxiety and PTSD.  Exacerbated by recent loss of her mother.  At this this time recommend she continue Metamucil daily and Senna S as needed for constipation, if diarrhea presents then hold off on Senna.  Continue Bentyl to take before meals as offering  benefit to bloating.  Recommend heavy focus on diet changes with more fiber and water intake.  Recent labs stable.  Referral for colonoscopy, which will offer benefit.         Other   PTSD (post-traumatic stress disorder)    Refer to depression plan of care.      Severe recurrent major depression without psychotic features ([place]) - Primary    Chronic, ongoing.  Continue current medication regimen and adjust as needed.  New referral to psychiatry placed last visit, she is to call and schedule with psychiatrist -- as they did call her to schedule, provided her with number.   Denies SI/HI.  At this time will continue short Klonopin dosing, but this is only short term until settled with psychiatry.       Other Visit Diagnoses     Colon cancer screening       Referral to GI placed.   Relevant  Orders   Ambulatory referral to Gastroenterology        Follow up plan: Return for as scheduled in August 2023.

## 2022-02-03 NOTE — Assessment & Plan Note (Signed)
Refer to depression plan of care. 

## 2022-02-04 ENCOUNTER — Ambulatory Visit: Payer: 59

## 2022-02-04 ENCOUNTER — Telehealth: Payer: Self-pay

## 2022-02-04 NOTE — Telephone Encounter (Signed)
Patient will call us to schedule after she arranges transportation

## 2022-02-17 ENCOUNTER — Ambulatory Visit: Admit: 2022-02-17 | Discharge: 2022-02-18 | Payer: MEDICARE

## 2022-02-17 ENCOUNTER — Ambulatory Visit
Admit: 2022-02-17 | Discharge: 2022-02-18 | Payer: MEDICARE | Attending: Orthopaedic Surgery | Primary: Orthopaedic Surgery

## 2022-02-17 DIAGNOSIS — G8929 Other chronic pain: Principal | ICD-10-CM

## 2022-02-17 DIAGNOSIS — M5416 Radiculopathy, lumbar region: Principal | ICD-10-CM

## 2022-02-17 DIAGNOSIS — M7061 Trochanteric bursitis, right hip: Principal | ICD-10-CM

## 2022-02-17 DIAGNOSIS — M542 Cervicalgia: Principal | ICD-10-CM
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
## 2022-02-17 NOTE — Unmapped (Signed)
Your diagnosis: Neck pain and Low back pain. ? Trochanteric bursitis.    Recommendations/Plan  Medications: Take medications as precribed by your Medical Team  Activity: Activities as tolerated.  Imaging studies: MRI of the Lumbosacral spine was ordered. Medical necessity: Radiographs have been performed/ordered. We suspect nerve compression.  Cervical AP, lateral, flexion, and extension views (today on the way out).  Lumbar AP, lateral, and Ferguson views (today on the way out).  Conservative Care: Let's continue to monitor your symptoms.  We referred you to Orthopaedics.  You can contact [facility] at [phone]). (For possible right trochanteric bursitis)      Follow-up: After imaging studies are completed.    Contact our clinic nursing team via MyChart or [phone] with any questions/concerns.  Contact our Spine Surgery Nurse Coordinator at [phone] with surgery-related issues.

## 2022-02-18 NOTE — Unmapped (Signed)
[facility] Refill Coordination Note    Specialty Medication(s) to be Shipped:   Inflammatory Disorders: Humira and methotrexate (injectable)    Other medication(s) to be shipped: No additional medications requested for fill at this time     Michele Miller, DOB: [birth year]  Phone: [phone] (home)       All above HIPAA information was verified with patient.     Was a Nurse, learning disability used for this call? No    Completed refill call assessment today to schedule patient's medication shipment from the [facility] [phone]).  All relevant notes have been reviewed.     Specialty medication(s) and dose(s) confirmed: Regimen is correct and unchanged.   Changes to medications: [name] reports no changes at this time.  Changes to insurance: No  New side effects reported not previously addressed with a pharmacist or physician: None reported  Questions for the pharmacist: No    Confirmed patient received a Conservation officer, historic buildings and a Surveyor, mining with first shipment. The patient will receive a drug information handout for each medication shipped and additional FDA Medication Guides as required.       DISEASE/MEDICATION-SPECIFIC INFORMATION        For patients on injectable medications: Patient currently has 1 doses left.  Next injection is scheduled for 02/21/22. Humira 0 doses left. Next injection 02/21/22    SPECIALTY MEDICATION ADHERENCE     Medication Adherence    Patient reported X missed doses in the last month: 0  Specialty Medication: Humira  Patient is on additional specialty medications: Yes  Additional Specialty Medications: methotrexate  Patient Reported Additional Medication X Missed Doses in the Last Month: 0  Patient is on more than two specialty medications: No              Were doses missed due to medication being on hold? No    Humira 40/0.4 mg/ml: 0 days of medicine on hand   Methrotrexate 25 mg/ml: 3 days of medicine on hand        REFERRAL TO PHARMACIST Referral to the pharmacist: Not needed      [facility]     Shipping address confirmed in Epic.     Delivery Scheduled: Yes, Expected medication delivery date: 02/21/22.     Medication will be delivered via Same Day Courier to the prescription address in Epic WAM.    Unk Lightning   [facility] Specialty Technician

## 2022-02-21 MED FILL — BD TUBERCULIN SYRINGE 1 ML 27 X 1/2": 28 days supply | Qty: 4 | Fill #4

## 2022-02-21 MED FILL — HUMIRA PEN CITRATE FREE 40 MG/0.4 ML: SUBCUTANEOUS | 28 days supply | Qty: 2 | Fill #4

## 2022-02-21 NOTE — Unmapped (Signed)
Michele Miller 's methotrexate (CONTAINS PRESERVATIVES) 25 mg/mL injection solution shipment will be delayed as a result of the medication is too soon to refill until 02/25/2022.     I have reached out to the patient  at [phone] and communicated the delay. We will wait for a call back from the patient to reschedule the delivery.  We have not confirmed the new delivery date.

## 2022-02-25 ENCOUNTER — Ambulatory Visit: Admit: 2022-02-25 | Discharge: 2022-02-26 | Payer: MEDICARE

## 2022-02-27 DIAGNOSIS — Z79631 Methotrexate, long term, current use: Principal | ICD-10-CM

## 2022-02-27 DIAGNOSIS — L4050 Arthropathic psoriasis, unspecified: Principal | ICD-10-CM

## 2022-02-27 MED ORDER — METHOTREXATE SODIUM (CONTAINS PRESERVATIVES) 25 MG/ML INJECTION SOLUTION
SUBCUTANEOUS | 0 refills | 84 days | Status: CP
Start: 2022-02-27 — End: 2022-05-28
  Filled 2022-02-26: qty 12, 84d supply, fill #0

## 2022-02-27 NOTE — Unmapped (Signed)
Methotrexate refill  Last Visit Date: 09/18/2021  Next Visit Date: 04/30/2022    Lab Results   Component Value Date    ALT 12 09/18/2021    AST 16 09/18/2021    ALBUMIN 3.7 09/10/2019    CREATININE 0.68 09/18/2021     Lab Results   Component Value Date    WBC 7.2 09/18/2021    HGB 13.9 09/18/2021    HCT 41.7 09/18/2021    PLT 283 09/18/2021     Lab Results   Component Value Date    NEUTROPCT 63.8 09/18/2021    LYMPHOPCT 30.5 09/18/2021    MONOPCT 2.9 09/18/2021    EOSPCT 1.9 09/18/2021    BASOPCT 0.9 09/18/2021

## 2022-03-06 ENCOUNTER — Ambulatory Visit
Admission: RE | Admit: 2022-03-06 | Discharge: 2022-03-06 | Disposition: A | Discharge status: Home / Self Care | Payer: 59 | Source: Ambulatory Visit | Attending: Nurse Practitioner | Admitting: Nurse Practitioner

## 2022-03-06 DIAGNOSIS — Z1231 Encounter for screening mammogram for malignant neoplasm of breast: Secondary | ICD-10-CM | POA: Diagnosis present

## 2022-03-10 ENCOUNTER — Inpatient Hospital Stay
Admission: RE | Admit: 2022-03-10 | Discharge: 2022-03-10 | Disposition: A | Discharge status: Home / Self Care | Payer: Self-pay | Source: Ambulatory Visit | Attending: *Deleted | Admitting: *Deleted

## 2022-03-10 ENCOUNTER — Other Ambulatory Visit: Payer: Self-pay | Admitting: *Deleted

## 2022-03-10 DIAGNOSIS — Z1231 Encounter for screening mammogram for malignant neoplasm of breast: Secondary | ICD-10-CM

## 2022-03-17 NOTE — Unmapped (Signed)
[facility] Clinical Assessment & Refill Coordination Note    Michele Miller, DOB: Aug 03, 1961  Phone: [phone] (home)     All above HIPAA information was verified with patient.     Was a Nurse, learning disability used for this call? No    Specialty Medication(s):   Inflammatory Disorders: Humira and methotrexate (injectable)     Current Outpatient Medications   Medication Sig Dispense Refill   ??? acitretin (SORIATANE) 25 MG capsule Take tablet every other day 90 capsule 3   ??? adalimumab 80 mg/0.8 mL-40 mg/0.4 mL PnKt Inject the contents of 1 pen (80mg ) under the skin once for initial dose, then inject the contents of 1 pen (40mg ) every other week beginning 1 week after initial dose. 3 each 0   ??? albuterol HFA 90 mcg/actuation inhaler Inhale 2 puffs by mouth every six (6) hours as needed for wheezing. 54 g 6   ??? cetirizine (ZYRTEC) 10 MG tablet Take 1 tablet (10 mg total) by mouth daily. 90 tablet 3   ??? clobetasoL (TEMOVATE) 0.05 % ointment Apply topically Two (2) times a day. To dry hands as needed 60 g 1   ??? clonazePAM (KLONOPIN) 0.5 MG tablet Take 1 tablet (0.5 mg total) by mouth Three (3) times a day.     ??? clonazePAM (KLONOPIN) 1 MG tablet Take 1 tablet (1 mg total) by mouth Three (3) times a day.     ??? DULoxetine (CYMBALTA) 60 MG capsule Take 2 capsules (120 mg total) by mouth daily. 60 capsule 3   ??? empty container (BD SHARPS COLLECTOR) Misc Use as directed. 1 each 3   ??? fluticasone propionate (FLONASE) 50 mcg/actuation nasal spray Use 2 sprays in each nostril daily. 16 g 4   ??? folic acid (FOLVITE) 1 MG tablet Take 1 tablet (1mg ) by mouth daily. 90 tablet 3   ??? gabapentin (NEURONTIN) 300 MG capsule TAKE 1 CAPSULE(300 MG) BY MOUTH THREE TIMES DAILY 270 capsule 0   ??? HUMIRA PEN CITRATE FREE 40 MG/0.4 ML Inject the contents of 1 pen (40mg ) under the skin every 14 days 2 each 11   ??? loratadine (CLARITIN) 10 mg tablet      ??? meclizine (ANTIVERT) 12.5 mg tablet Take 1 tablet (12.5 mg total) by mouth Three (3) times a day as needed.     ??? methocarbamoL (ROBAXIN) 500 MG tablet Take 1 tablet (500 mg total) by mouth Three (3) times a day as needed. (Patient not taking: Reported on 02/17/2022) 60 tablet 3   ??? methotrexate sodium (METHOTREXATE, CONTAINS PRESERVATIVES,) 25 mg/mL injection solution Inject 1 mL (25mg ) under the skin once a week 12 mL 0   ??? multivitamin with minerals tablet Take 1 tablet by mouth daily.     ??? senna (SENOKOT) 8.6 mg tablet Take 1 tablet by mouth daily as needed for constipation. take 1 pill as needed for cosntipation may increase to 2 tabs if no relief     ??? syringe (BD TUBERCULIN SYRINGE) 1 mL 27 x 1/2 Syrg Use as directed for weekly methotrexate injections. 100 each PRN   ??? tazarotene (AVAGE) 0.1 % cream Mix with clobetasol and apply twice daily to rash on hands 60 g 5   ??? tiotropium bromide (SPIRIVA RESPIMAT) 1.25 mcg/actuation Mist Inhale 2 puffs (2.5 mcg) BY MOUTH daily. 4 g 5   ??? tiotropium-olodateroL (STIOLTO RESPIMAT) 2.5-2.5 mcg/actuation Mist Inhale 2 puffs daily.     ??? traZODone (DESYREL) 50 MG tablet Take  1 tablet (50 mg total) by mouth nightly. takes 2 tabs (Patient not taking: Reported on 02/17/2022)     ??? VYVANSE 30 mg capsule  (Patient not taking: Reported on 02/17/2022)       No current facility-administered medications for this visit.        Changes to medications: [name] reports no changes at this time.    No Known Allergies    Changes to allergies: No    SPECIALTY MEDICATION ADHERENCE     methotrexate 25 mg/ml: 2 months days of medicine on hand       Medication Adherence    Patient reported X missed doses in the last month: 0  Specialty Medication: Humira q14d  Patient is on additional specialty medications: Yes  Additional Specialty Medications: Methotexate q week   Patient Reported Additional Medication X Missed Doses in the Last Month: 0  Patient is on more than two specialty medications: No          Specialty medication(s) dose(s) confirmed: Regimen is correct and unchanged.     Are there any concerns with adherence? No    Adherence counseling provided? Not needed    CLINICAL MANAGEMENT AND INTERVENTION      Clinical Benefit Assessment:    Do you feel the medicine is effective or helping your condition? Yes    Clinical Benefit counseling provided? Progress note from 1/4 and has follow-up in August shows evidence of clinical benefit    Adverse Effects Assessment:    Are you experiencing any side effects? No    Are you experiencing difficulty administering your medicine? No    Quality of Life Assessment:    Quality of Life    Rheumatology  1. What impact has your specialty medication had on the reduction of your daily pain level?: Some  2. What impact has your specialty medication had on your ability to complete daily tasks (prepare meals, get dressed, etc...)?: Some  Oncology  Dermatology  1. What impact has your specialty medication had on the symptoms of your skin condition (i.e. itchiness, soreness, stinging)?: Minimal  Cystic Fibrosis              Have you discussed this with your provider? Yes    Acute Infection Status:    Acute infections noted within Epic:  No active infections  Patient reported infection: None    Therapy Appropriateness:    Is therapy appropriate and patient progressing towards therapeutic goals? Yes, therapy is appropriate and should be continued    DISEASE/MEDICATION-SPECIFIC INFORMATION      For patients on injectable medications: Patient currently has 1 doses left.  Next injection is scheduled for 7/7. (Humira)    PATIENT SPECIFIC NEEDS     - Does the patient have any physical, cognitive, or cultural barriers? No    - Is the patient high risk? No    - Does the patient require a Care Management Plan? No     SOCIAL DETERMINANTS OF HEALTH     At the [facility], we have learned that life circumstances - like trouble affording food, housing, utilities, or transportation can affect the health of many of our patients.   That is why we wanted to ask: are you currently experiencing any life circumstances that are negatively impacting your health and/or quality of life? Patient declined to answer    Social Determinants of Health     Financial Resource Strain: Not on file   Internet Connectivity: Not on file  Food Insecurity: Not on file   Tobacco Use: High Risk   ??? Smoking Tobacco Use: Every Day   ??? Smokeless Tobacco Use: Never   ??? Passive Exposure: Not on file   Housing/Utilities: Not on file   Alcohol Use: Not on file   Transportation Needs: Not on file   Substance Use: Not on file   Health Literacy: Not on file   Physical Activity: Not on file   Interpersonal Safety: Not on file   Stress: Not on file   Intimate Partner Violence: Not on file   Depression: Not on file   Social Connections: Not on file       Would you be willing to receive help with any of the needs that you have identified today? Not applicable       SHIPPING     Specialty Medication(s) to be Shipped:   Inflammatory Disorders: Humira    Other medication(s) to be shipped: No additional medications requested for fill at this time     Denied folic acid, too soon for methotrexate     Changes to insurance: No    Delivery Scheduled: Yes, Expected medication delivery date: 7/11.     Medication will be delivered via Same Day Courier to the confirmed prescription address in [facility].    The patient will receive a drug information handout for each medication shipped and additional FDA Medication Guides as required.  Verified that patient has previously received a Conservation officer, historic buildings and a Surveyor, mining.    The patient or caregiver noted above participated in the development of this care plan and knows that they can request review of or adjustments to the care plan at any time.      All of the patient's questions and concerns have been addressed.    Julianne Rice   [facility] Specialty Pharmacist

## 2022-03-25 MED FILL — HUMIRA PEN CITRATE FREE 40 MG/0.4 ML: SUBCUTANEOUS | 28 days supply | Qty: 2 | Fill #5

## 2022-04-05 ENCOUNTER — Other Ambulatory Visit: Payer: Self-pay | Admitting: Nurse Practitioner

## 2022-04-07 NOTE — Telephone Encounter (Signed)
Requested Prescriptions  Pending Prescriptions Disp Refills  . fluticasone (FLONASE) 50 MCG/ACT nasal spray [Pharmacy Med Name: FLUTICASONE PROP 50 MCG SPRAY] 16 g 0    Sig: Place 2 sprays into both nostrils daily.     Ear, Nose, and Throat: Nasal Preparations - Corticosteroids Passed - 04/05/2022  1:12 PM      Passed - Valid encounter within last 12 months    Recent Outpatient Visits          2 months ago Severe recurrent major depression without psychotic features ([place])   S[place], [name] T, NP   3 months ago Centrilobular emphysema ([place])   [place], Henrine Screws T, NP   3 months ago Functional diarrhea   [facility] Vigg, Avanti, MD   3 months ago [place], MD   5 months ago Centrilobular emphysema ([place])   [place], Barbaraann Faster, NP      Future Appointments            In 3 weeks Cannady, Barbaraann Faster, NP [employer]

## 2022-04-27 DIAGNOSIS — E559 Vitamin D deficiency, unspecified: Secondary | ICD-10-CM | POA: Insufficient documentation

## 2022-04-28 ENCOUNTER — Ambulatory Visit
Admission: EM | Admit: 2022-04-28 | Discharge: 2022-04-28 | Disposition: A | Discharge status: Home / Self Care | Payer: 59 | Attending: Physician Assistant | Admitting: Physician Assistant

## 2022-04-28 ENCOUNTER — Other Ambulatory Visit: Payer: Self-pay

## 2022-04-28 ENCOUNTER — Encounter: Payer: Self-pay | Admitting: Emergency Medicine

## 2022-04-28 DIAGNOSIS — J069 Acute upper respiratory infection, unspecified: Secondary | ICD-10-CM

## 2022-04-28 DIAGNOSIS — Z8709 Personal history of other diseases of the respiratory system: Secondary | ICD-10-CM

## 2022-04-28 DIAGNOSIS — R0981 Nasal congestion: Secondary | ICD-10-CM

## 2022-04-28 DIAGNOSIS — R051 Acute cough: Secondary | ICD-10-CM

## 2022-04-28 MED ORDER — AZITHROMYCIN 250 MG PO TABS
250.0000 mg | ORAL_TABLET | Freq: Every day | ORAL | 0 refills | Status: DC
Start: 2022-04-28 — End: 2022-05-12

## 2022-04-28 NOTE — ED Provider Notes (Signed)
[facility]    CSN: [id number] Arrival date & time: 04/28/22  1350      History   Chief Complaint Chief Complaint  Patient presents with   Nasal Congestion   Cough    HPI Emily Johnston is a 61 y.o. female presenting for 4-5 day history of nasal congestion, sinus pressure, fatigue and cough.  Reports cough is occasionally productive of yellowish phlegm.  Reports a little bit of increase in baseline shortness of breath but says it has not been bad enough to have to use her albuterol inhaler.  Denies sick contacts.  Has been taking over-the-counter decongestants and using Flonase.  Patient declines COVID testing.  Medical history significant for COPD, fibromyalgia, allergies, tobacco abuse. Patient is on methotrexate and Humira for psoriatic arthritis.   HPI  Past Medical History:  Diagnosis Date   Anxiety    Arthritis    COPD (chronic obstructive pulmonary disease) (HCC)    Depression    Fibromyalgia    IBS (irritable bowel syndrome)    PTSD (post-traumatic stress disorder)     Patient Active Problem List   Diagnosis Date Noted   Vitamin D deficiency 04/27/2022   Elevated low density lipoprotein (LDL) cholesterol level 05/04/2021   Family history of breast cancer in mother 10/29/2020   Back pain 10/11/2020   Plantar fasciitis 07/10/2020   Basal cell carcinoma (BCC) 07/10/2020   Spondylosis of cervical spine with myelopathy and radiculopathy 10/24/2019   Aortic atherosclerosis ([place]) 06/15/2019   Psoriatic arthritis ([place]) 03/22/2019   Centrilobular emphysema ([place]) 02/22/2019   Nicotine dependence, cigarettes, w unsp disorders 02/22/2019   IBS (irritable bowel syndrome) 11/01/2018   Severe recurrent major depression without psychotic features ([place]) 01/12/2018   PTSD (post-traumatic stress disorder) 01/12/2018   Fibromyalgia 01/12/2018    Past Surgical History:  Procedure Laterality Date   ABDOMINAL HYSTERECTOMY     CARPAL TUNNEL RELEASE      KNEE SURGERY      OB History   No obstetric history on file.      Home Medications    Prior to Admission medications   Medication Sig Start Date End Date Taking? Authorizing Provider  albuterol (VENTOLIN HFA) 108 (90 Base) MCG/ACT inhaler Inhale 2 puffs into the lungs every 6 (six) hours as needed for wheezing or shortness of breath. 09/04/20  Yes Johnson, Megan P, DO  azithromycin (ZITHROMAX) 250 MG tablet Take 1 tablet (250 mg total) by mouth daily. Take first 2 tablets together, then 1 every day until finished. 04/28/22  Yes Danton Clap, PA-C  DULoxetine (CYMBALTA) 60 MG capsule Take 2 capsules by mouth daily. 10/25/18 04/28/22 Yes [provider]  fluticasone (FLONASE) 50 MCG/ACT nasal spray Place 2 sprays into both nostrils daily. 04/07/22  Yes Cannady, [name] T, NP  folic acid (FOLVITE) 1 MG tablet Take 1 mg by mouth daily.   Yes [provider]  gabapentin (NEURONTIN) 300 MG capsule Take 300 mg by mouth 3 (three) times daily.   Yes [provider]  meclizine (ANTIVERT) 12.5 MG tablet Take 1 tablet (12.5 mg total) by mouth 3 (three) times daily as needed for dizziness. 09/04/20  Yes Johnson, Megan P, DO  methotrexate 250 MG/10ML injection Inject 25 mg into the vein once a week.   Yes [provider]  Multiple Vitamin (MULTIVITAMIN) tablet Take 1 tablet by mouth daily.   Yes [provider]  STIOLTO RESPIMAT 2.5-2.5 MCG/ACT AERS INHALE 2 PUFFS BY MOUTH EVERY DAY 01/27/22  Yes Cannady, [name] T, NP  acitretin (SORIATANE) 25 MG capsule Take 1 capsule by mouth daily. 04/30/20   [provider]  Adalimumab (HUMIRA PEN) 40 MG/0.4ML PNKT Inject into the skin. 02/10/20 04/27/22  [provider]  atomoxetine (STRATTERA) 40 MG capsule Take 40 mg by mouth every morning. 09/11/21   [provider]  clobetasol ointment (TEMOVATE) 1.30 % Apply 1 application. topically 2 (two) times daily. 09/24/21   [provider]   clonazePAM (KLONOPIN) 0.5 MG tablet Take 1 tablet (0.5 mg total) by mouth 2 (two) times daily as needed for anxiety. 02/05/22   Cannady, Henrine Screws T, NP  dicyclomine (BENTYL) 10 MG capsule Take 1 capsule (10 mg total) by mouth 3 (three) times daily before meals. 01/06/22   Marnee Guarneri T, NP  lactobacillus acidophilus & bulgar (LACTINEX) chewable tablet Chew 1 tablet by mouth 3 (three) times daily with meals. 12/10/21   Vigg, Avanti, MD  montelukast (SINGULAIR) 10 MG tablet Take 1 tablet (10 mg total) by mouth at bedtime. 01/06/22   Cannady, Henrine Screws T, NP  OLANZapine (ZYPREXA) 5 MG tablet Take 2.5 mg by mouth 2 (two) times daily. 10/08/20   [provider]  Olopatadine HCl 0.2 % SOLN Apply 1 drop to eye daily. 01/01/22   Melynda Ripple, MD  Spacer/Aero-Holding Chambers (AEROCHAMBER PLUS) inhaler Use with inhaler 01/01/22   Melynda Ripple, MD    Family History Family History  Problem Relation Age of Onset   Breast cancer Mother 62    Social History Social History   Tobacco Use   Smoking status: Every Day    Packs/day: 1.00    Years: 41.00    Total pack years: 41.00    Types: Cigarettes   Smokeless tobacco: Never  Vaping Use   Vaping Use: Never used  Substance Use Topics   Alcohol use: No   Drug use: No     Allergies   Patient has no known allergies.   Review of Systems Review of Systems  Constitutional:  Positive for fatigue. Negative for chills, diaphoresis and fever.  HENT:  Positive for congestion, rhinorrhea and sinus pressure. Negative for ear pain, sinus pain and sore throat.   Respiratory:  Positive for cough and shortness of breath.   Cardiovascular:  Negative for chest pain.  Gastrointestinal:  Negative for abdominal pain, nausea and vomiting.  Musculoskeletal:  Negative for arthralgias and myalgias.  Skin:  Negative for rash.  Neurological:  Negative for weakness and headaches.  Hematological:  Negative for adenopathy.     Physical Exam Triage  Vital Signs ED Triage Vitals  Enc Vitals Group     BP 04/28/22 1404 (!) 138/100     Pulse Rate 04/28/22 1404 90     Resp 04/28/22 1404 16     Temp 04/28/22 1404 98.5 F (36.9 C)     Temp Source 04/28/22 1404 Oral     SpO2 04/28/22 1404 99 %     Weight 04/28/22 1402 141 lb 5 oz (64.1 kg)     Height 04/28/22 1402 '5\' 3"'$  (1.6 m)     Head Circumference --      Peak Flow --      Pain Score 04/28/22 1401 5     Pain Loc --      Pain Edu? --      Excl. in [place]? --    No data found.  Updated Vital Signs BP (!) 138/100 (BP Location: Left Arm)   Pulse 90  Temp 98.5 F (36.9 C) (Oral)   Resp 16   Ht '5\' 3"'$  (1.6 m)   Wt 141 lb 5 oz (64.1 kg)   SpO2 99%   BMI 25.03 kg/m    Physical Exam Vitals and nursing note reviewed.  Constitutional:      General: She is not in acute distress.    Appearance: Normal appearance. She is ill-appearing. She is not toxic-appearing.  HENT:     Head: Normocephalic and atraumatic.     Right Ear: Ear canal and external ear normal. A middle ear effusion is present.     Left Ear: Ear canal and external ear normal. A middle ear effusion is present.     Nose: Congestion present.     Mouth/Throat:     Mouth: Mucous membranes are moist.     Pharynx: Oropharynx is clear.  Eyes:     General: No scleral icterus.       Right eye: No discharge.        Left eye: No discharge.     Conjunctiva/sclera: Conjunctivae normal.  Cardiovascular:     Rate and Rhythm: Normal rate and regular rhythm.     Heart sounds: Normal heart sounds.  Pulmonary:     Effort: Pulmonary effort is normal. No respiratory distress.     Breath sounds: Normal breath sounds.  Musculoskeletal:     Cervical back: Neck supple.  Skin:    General: Skin is dry.  Neurological:     General: No focal deficit present.     Mental Status: She is alert. Mental status is at baseline.     Motor: No weakness.     Gait: Gait normal.  Psychiatric:        Mood and Affect: Mood normal.        Behavior:  Behavior normal.        Thought Content: Thought content normal.      UC Treatments / Results  Labs (all labs ordered are listed, but only abnormal results are displayed) Labs Reviewed - No data to display  EKG   Radiology No results found.  Procedures Procedures (including critical care time)  Medications Ordered in UC Medications - No data to display  Initial Impression / Assessment and Plan / UC Course  I have reviewed the triage vital signs and the nursing notes.  Pertinent labs & imaging results that were available during my care of the patient were reviewed by me and considered in my medical decision making (see chart for details).   61 y/o female with history of COPD, psoriatic arthritis, fibromyalgia and long time use of Humira/Methotrexate presents for 4-5 day history of cough and congestion.   Patient is afebrile.  Mildly ill-appearing but nontoxic.  On exam she does have nasal congestion and clear effusion of bilateral TMs.  Chest clear to auscultation heart regular rate and rhythm.  Patient declines COVID test.  Advised patient that it is most likely she has a viral illness.  Supportive care encouraged.  Patient is concerned given that she has COPD and is on immune suppressing drugs for her psoriatic arthritis.  Will cover with antibiotic in case she is starting to have COPD exacerbation.  Sent azithromycin to pharmacy.  Encouraged her to use her inhalers and take Mucinex D and use Flonase.  Increase rest and fluids.  Reviewed return and ER precautions.   Final Clinical Impressions(s) / UC Diagnoses   Final diagnoses:  Upper respiratory tract infection, unspecified type  Acute  cough  Nasal congestion  History of COPD     Discharge Instructions      -Your symptoms are likely due to a virus but given your history of COPD and the fact that you are on immune suppressing drugs, we will cover you with an antibiotic.  Take full course. - You will likely be sick  for another 7 to 10 days.  Take Mucinex D and use Flonase.  Increase rest and fluids.  Use your inhalers if needed especially for short of breath. - Go to ER if you have any shortness of breath not relieved by use of your inhalers or you begin to feel worse.     ED Prescriptions     Medication Sig Dispense Auth. Provider   azithromycin (ZITHROMAX) 250 MG tablet Take 1 tablet (250 mg total) by mouth daily. Take first 2 tablets together, then 1 every day until finished. 6 tablet Gretta Cool      PDMP not reviewed this encounter.   Danton Clap, PA-C 04/28/22 1430

## 2022-04-28 NOTE — Discharge Instructions (Addendum)
-  Your symptoms are likely due to a virus but given your history of COPD and the fact that you are on immune suppressing drugs, we will cover you with an antibiotic.  Take full course. - You will likely be sick for another 7 to 10 days.  Take Mucinex D and use Flonase.  Increase rest and fluids.  Use your inhalers if needed especially for short of breath. - Go to ER if you have any shortness of breath not relieved by use of your inhalers or you begin to feel worse.

## 2022-04-28 NOTE — ED Triage Notes (Signed)
Pt c/o nasal congestion, subjective fever, cough. Started about 4 days ago. Pt declines covid testing.

## 2022-04-29 NOTE — Unmapped (Signed)
[facility] Refill Coordination Note    Specialty Medication(s) to be Shipped:   Inflammatory Disorders: Humira and methotrexate (injectable)    Other medication(s) to be shipped: bd tuberculin syringes     Michele Miller, DOB: 09/09/1961  Phone: [phone] (home)       All above HIPAA information was verified with patient.     Was a Nurse, learning disability used for this call? No    Completed refill call assessment today to schedule patient's medication shipment from the [facility] [phone]).  All relevant notes have been reviewed.     Specialty medication(s) and dose(s) confirmed: Regimen is correct and unchanged.   Changes to medications: [name] reports no changes at this time.  Changes to insurance: No  New side effects reported not previously addressed with a pharmacist or physician: None reported  Questions for the pharmacist: No    Confirmed patient received a Conservation officer, historic buildings and a Surveyor, mining with first shipment. The patient will receive a drug information handout for each medication shipped and additional FDA Medication Guides as required.       DISEASE/MEDICATION-SPECIFIC INFORMATION        For patients on injectable medications: Patient currently has 1 doses left.  Next injection is scheduled for Humira (05/02/22) and Methotrexate (05/05/22).    SPECIALTY MEDICATION ADHERENCE     Medication Adherence    Patient reported X missed doses in the last month: 0  Specialty Medication: Humira 40mg /0.30ml  Patient is on additional specialty medications: Yes  Additional Specialty Medications: Methotrexate 25mg /ml  Patient Reported Additional Medication X Missed Doses in the Last Month: 0  Patient is on more than two specialty medications: No  Informant: patient                          Were doses missed due to medication being on hold? No    Humira 40 mg/0.43ml: 14 days of medicine on hand   Methotrexate 25 mg/ml: 7 days of medicine on hand     REFERRAL TO PHARMACIST     Referral to the pharmacist: Not needed      Michele Miller     Shipping address confirmed in Epic.     Delivery Scheduled: Yes, Expected medication delivery date: 05/01/22.     Medication will be delivered via Same Day Courier to the prescription address in Epic W[facility].    Michele Miller   [facility] Specialty Technician

## 2022-04-30 ENCOUNTER — Ambulatory Visit: Admit: 2022-04-30 | Discharge: 2022-04-30 | Payer: MEDICARE

## 2022-04-30 ENCOUNTER — Ambulatory Visit
Admit: 2022-04-30 | Discharge: 2022-04-30 | Payer: MEDICARE | Attending: Student in an Organized Health Care Education/Training Program | Primary: Student in an Organized Health Care Education/Training Program

## 2022-04-30 DIAGNOSIS — L409 Psoriasis, unspecified: Principal | ICD-10-CM

## 2022-04-30 DIAGNOSIS — F332 Major depressive disorder, recurrent severe without psychotic features: Principal | ICD-10-CM

## 2022-04-30 DIAGNOSIS — F4310 Post-traumatic stress disorder, unspecified: Principal | ICD-10-CM

## 2022-04-30 DIAGNOSIS — M25561 Pain in right knee: Principal | ICD-10-CM

## 2022-04-30 DIAGNOSIS — L4050 Arthropathic psoriasis, unspecified: Principal | ICD-10-CM

## 2022-04-30 DIAGNOSIS — M1990 Unspecified osteoarthritis, unspecified site: Principal | ICD-10-CM

## 2022-04-30 LAB — AST: AST (SGOT): 19 U/L (ref <=34)

## 2022-04-30 LAB — CBC W/ AUTO DIFF
BASOPHILS ABSOLUTE COUNT: 0.1 10*9/L (ref 0.0–0.1)
BASOPHILS RELATIVE PERCENT: 1.1 %
EOSINOPHILS ABSOLUTE COUNT: 0.2 10*9/L (ref 0.0–0.5)
EOSINOPHILS RELATIVE PERCENT: 3.2 %
HEMATOCRIT: 39.6 % (ref 34.0–44.0)
HEMOGLOBIN: 13.4 g/dL (ref 11.3–14.9)
LYMPHOCYTES ABSOLUTE COUNT: 2.4 10*9/L (ref 1.1–3.6)
LYMPHOCYTES RELATIVE PERCENT: 38.4 %
MEAN CORPUSCULAR HEMOGLOBIN CONC: 33.8 g/dL (ref 32.0–36.0)
MEAN CORPUSCULAR HEMOGLOBIN: 33.9 pg — ABNORMAL HIGH (ref 25.9–32.4)
MEAN CORPUSCULAR VOLUME: 100.2 fL — ABNORMAL HIGH (ref 77.6–95.7)
MEAN PLATELET VOLUME: 8.3 fL (ref 6.8–10.7)
MONOCYTES ABSOLUTE COUNT: 0.3 10*9/L (ref 0.3–0.8)
MONOCYTES RELATIVE PERCENT: 4.2 %
NEUTROPHILS ABSOLUTE COUNT: 3.4 10*9/L (ref 1.8–7.8)
NEUTROPHILS RELATIVE PERCENT: 53.1 %
NUCLEATED RED BLOOD CELLS: 0 /100{WBCs} (ref <=4)
PLATELET COUNT: 262 10*9/L (ref 150–450)
RED BLOOD CELL COUNT: 3.96 10*12/L (ref 3.95–5.13)
RED CELL DISTRIBUTION WIDTH: 15.3 % — ABNORMAL HIGH (ref 12.2–15.2)
WBC ADJUSTED: 6.4 10*9/L (ref 3.6–11.2)

## 2022-04-30 LAB — ALT: ALT (SGPT): 14 U/L (ref 10–49)

## 2022-04-30 LAB — CREATININE
CREATININE: 0.74 mg/dL
EGFR CKD-EPI (2021) FEMALE: >90 mL/min/{1.73_m2} (ref >=60)

## 2022-04-30 MED ORDER — DULOXETINE 60 MG CAPSULE,DELAYED RELEASE
ORAL_CAPSULE | Freq: Every day | ORAL | 0 refills | 14 days | Status: CP
Start: 2022-04-30 — End: 2022-05-14

## 2022-04-30 NOTE — Unmapped (Signed)
Rheumatology Clinic Follow-up Note     Assessment/Plan:    Michele Miller is a 61 y.o.  female with a PMHx of PsA, DDD, amplified pain syndrome who presents today for follow-up of PsA.     This is my first time seeing the patient myself, she was previously following with Dr. Delton See. She is currently on Humira q14d and MTX 25mg  qweekly. No evidence of PsA disease activity by H&P today and she reports adequate skin control. Ongoing pain most c/w OA/fibromyalgia.    Hx of PsA: Controlled today, no changes made to medications. Will obtain MTX monitoring labs.  - MTX 25mg  SQ qweekly  - FA daily  - Humira per derm (q14d)  - Continue to follow with derm  - Films last updated 09/2021    Post-Traumatic OA: Chronic R knee pain that has been present since she dislocated it as a young adult, more noticeable lately. Exam more c/w OA than PsA. We discussed PT for support and maintenance of function, patient amenable.  - PT referral    Fibromyalgia: Ongoing pain most c/w fibromyalgia. Previously following with pain medicine but has been lost to follow up. She reports she is going to be out of her Cymbalta tomorrow and needs a refill. Very short course given to avoid withdrawal until she can see her PCP (says she has appt in 7d). We discussed that I would not be refilling this.  - Cymbalta 120mg     Return in about 6 months (around 10/31/2022).    Fleeta Emmer, MD  Assistant Professor of Medicine  Department of Medicine/Division of Rheumatology  [facility]    Primary Care Provider: Marjie Skiff, [place]    HPI:  Michele Miller is a 61 y.o.  female with a PMHx of PsA, DDD, amplified pain syndrome who presents today for follow-up of PsA.     Today  Feels her PsA is well controlled and has not had a flare in some time. Remains on MTX and Humira without reported side effect. Feels her skin is doing well too. No major changes in life since last visit. No major illnesses or hospitalizations. Largest issue is ongoing R knee pain. Says she dislocated it as a young adult and it has bothered her since but this has been more noticeable lately. She does not feel it is her PsA.    History  Psoriatic arthritis treated with mtx SQ monotherapy, initially, following closely with derm due to desquamative rash and now on Humira (started 5/21) and acitretin with skin improvement. Also on pharmacotherapy for fibromyalgia from PCP. Recently seeing dermatology more frequently due to severe involvement of her hands.  Intolerant of O[place]la due to mental health issues. ACDF 2024/02/12with Dr. Jaynie Collins. Son died in 03-26-14, mother has breast cancer and patient is primary caregiver.    Was following pain management but has lost f/u due to transportation issues, recently referred but not yet seen again.    Review of Systems:  Positive findings noted above, otherwise a 14 point review of systems was reviewed and negative    Past Medical, Surgical, Family and Social History reviewed and updated per EMR     Allergies:  Patient has no known allergies.    Medications:     Current Outpatient Medications:     acitretin (SORIATANE) 25 MG capsule, Take tablet every other day, Disp: 90 capsule, Rfl: 3    adalimumab 80 mg/0.8 mL-40 mg/0.4 mL PnKt, Inject the contents of 1 pen (80mg )  under the skin once for initial dose, then inject the contents of 1 pen (40mg ) every other week beginning 1 week after initial dose., Disp: 3 each, Rfl: 0    albuterol HFA 90 mcg/actuation inhaler, Inhale 2 puffs by mouth every six (6) hours as needed for wheezing., Disp: 54 g, Rfl: 6    cetirizine (ZYRTEC) 10 MG tablet, Take 1 tablet (10 mg total) by mouth daily., Disp: 90 tablet, Rfl: 3    clobetasoL (TEMOVATE) 0.05 % ointment, Apply topically Two (2) times a day. To dry hands as needed, Disp: 60 g, Rfl: 1    empty container (BD SHARPS COLLECTOR) Misc, Use as directed., Disp: 1 each, Rfl: 3    fluticasone propionate (FLONASE) 50 mcg/actuation nasal spray, Use 2 sprays in each nostril daily., Disp: 16 g, Rfl: 4    folic acid (FOLVITE) 1 MG tablet, Take 1 tablet (1mg ) by mouth daily., Disp: 90 tablet, Rfl: 3    gabapentin (NEURONTIN) 300 MG capsule, TAKE 1 CAPSULE(300 MG) BY MOUTH THREE TIMES DAILY, Disp: 270 capsule, Rfl: 0    HUMIRA PEN CITRATE FREE 40 MG/0.4 ML, Inject the contents of 1 pen (40mg ) under the skin every 14 days, Disp: 2 each, Rfl: 11    loratadine (CLARITIN) 10 mg tablet, , Disp: , Rfl:     meclizine (ANTIVERT) 12.5 mg tablet, Take 1 tablet (12.5 mg total) by mouth Three (3) times a day as needed., Disp: , Rfl:     methotrexate sodium (METHOTREXATE, CONTAINS PRESERVATIVES,) 25 mg/mL injection solution, Inject 1 mL (25mg ) under the skin once a week, Disp: 12 mL, Rfl: 0    multivitamin with minerals tablet, Take 1 tablet by mouth daily., Disp: , Rfl:     senna (SENOKOT) 8.6 mg tablet, Take 1 tablet by mouth daily as needed for constipation. take 1 pill as needed for cosntipation may increase to 2 tabs if no relief, Disp: , Rfl:     syringe (BD TUBERCULIN SYRINGE) 1 mL 27 x 1/2 Syrg, Use as directed for weekly methotrexate injections., Disp: 100 each, Rfl: PRN    tazarotene (AVAGE) 0.1 % cream, Mix with clobetasol and apply twice daily to rash on hands, Disp: 60 g, Rfl: 5    tiotropium bromide (SPIRIVA RESPIMAT) 1.25 mcg/actuation Mist, Inhale 2 puffs (2.5 mcg) BY MOUTH daily., Disp: 4 g, Rfl: 5    tiotropium-olodateroL (STIOLTO RESPIMAT) 2.5-2.5 mcg/actuation Mist, Inhale 2 puffs daily., Disp: , Rfl:     DULoxetine (CYMBALTA) 60 MG capsule, Take 2 capsules (120 mg total) by mouth daily for 14 days., Disp: 28 capsule, Rfl: 0      Objective   Vitals:    04/30/22 1416   BP: 141/95   Pulse: 71   Temp: 36.3 ??C (97.3 ??F)   TempSrc: Temporal   Weight: 63.5 kg (140 lb)       Physical Exam  General: well appearing, no acute distress  Eyes: EOMI, normal conjunctivae   ENT: MMM. Oropharynx without any erythema or exudate. No oral or nasal ulcers.  Neck: Supple. No cervical lymphadenopathy  Cardiovascular: Regular rate and rhythm. Nl S1 and S2.  Pulmonary: Nl WOB on RA, CTAB.  Skin: No rash, lesions, breakdown. No purpura or petechiae. No digital ulcers.   Extremities: Warm and well perfused, no cyanosis, clubbing or edema  Musculoskeletal: Diffuse tenderness over hands, greater trochanter, trapezius, paraspinal areas. No synovitis or focal TTP over joints of the the hands, wrists, elbows, shoulders, knees, ankles, or feet. Cool  effusions of R>L knee with crepitus bilaterally.  Neurologic: Cranial nerves grossly intact, strength 5/5 throughout.  Normal sensation  Psychiatric: Normal mood and affect.

## 2022-05-01 MED FILL — METHOTREXATE SODIUM (CONTAINS PRESERVATIVES) 25 MG/ML INJECTION SOLUTION: SUBCUTANEOUS | 84 days supply | Qty: 12 | Fill #0

## 2022-05-01 MED FILL — BD TUBERCULIN SYRINGE 1 ML 27 X 1/2": 28 days supply | Qty: 4 | Fill #5

## 2022-05-01 MED FILL — HUMIRA PEN CITRATE FREE 40 MG/0.4 ML: SUBCUTANEOUS | 28 days supply | Qty: 2 | Fill #6

## 2022-05-02 ENCOUNTER — Ambulatory Visit: Payer: 59 | Admitting: Nurse Practitioner

## 2022-05-02 DIAGNOSIS — Z79631 Methotrexate, long term, current use: Principal | ICD-10-CM

## 2022-05-02 DIAGNOSIS — L4050 Arthropathic psoriasis, unspecified: Principal | ICD-10-CM

## 2022-05-02 DIAGNOSIS — J432 Centrilobular emphysema: Secondary | ICD-10-CM

## 2022-05-02 DIAGNOSIS — E559 Vitamin D deficiency, unspecified: Secondary | ICD-10-CM

## 2022-05-02 DIAGNOSIS — F17219 Nicotine dependence, cigarettes, with unspecified nicotine-induced disorders: Secondary | ICD-10-CM

## 2022-05-02 DIAGNOSIS — I700 Atherosclerosis of aorta: Secondary | ICD-10-CM

## 2022-05-02 DIAGNOSIS — F332 Major depressive disorder, recurrent severe without psychotic features: Secondary | ICD-10-CM

## 2022-05-02 DIAGNOSIS — F4310 Post-traumatic stress disorder, unspecified: Secondary | ICD-10-CM

## 2022-05-02 DIAGNOSIS — E7800 Pure hypercholesterolemia, unspecified: Secondary | ICD-10-CM

## 2022-05-02 DIAGNOSIS — M797 Fibromyalgia: Secondary | ICD-10-CM

## 2022-05-02 MED ORDER — METHOTREXATE SODIUM (CONTAINS PRESERVATIVES) 25 MG/ML INJECTION SOLUTION
SUBCUTANEOUS | 0 refills | 84 days
Start: 2022-05-02 — End: 2022-07-31

## 2022-05-02 NOTE — Unmapped (Signed)
Methotrexate refill  Last Visit Date: 04/30/2022  Next Visit Date: 10/16/2022    Lab Results   Component Value Date    ALT 14 04/30/2022    AST 19 04/30/2022    ALBUMIN 3.7 09/10/2019    CREATININE 0.74 04/30/2022     Lab Results   Component Value Date    WBC 6.4 04/30/2022    HGB 13.4 04/30/2022    HCT 39.6 04/30/2022    PLT 262 04/30/2022     Lab Results   Component Value Date    NEUTROPCT 53.1 04/30/2022    LYMPHOPCT 38.4 04/30/2022    MONOPCT 4.2 04/30/2022    EOSPCT 3.2 04/30/2022    BASOPCT 1.1 04/30/2022

## 2022-05-05 MED ORDER — METHOTREXATE SODIUM (CONTAINS PRESERVATIVES) 25 MG/ML INJECTION SOLUTION
SUBCUTANEOUS | 0 refills | 84 days | Status: CP
Start: 2022-05-05 — End: 2022-08-03

## 2022-05-10 NOTE — Patient Instructions (Incomplete)
Living with COPD Being diagnosed with chronic obstructive pulmonary disease (COPD) changes your life physically and emotionally. Having COPD can affect your ability to work and do things you enjoy. COPD is not the same for everyone, and it may change over time. Your health care providers can help you come up with the COPD management plan that works best for you. How to manage lifestyle changes Treatment plan Work closely with your health care providers. Follow your COPD management plan. This plan includes: Instructions about activities, exercises, diet, medicines, what to do when COPD flares up, and when to call your health care provider. A pulmonary rehabilitation program. In pulmonary rehab, you will learn about COPD, do exercises for fitness and breathing, and get support from health care providers and other people who have COPD. Managing emotions and stress Living with a chronic disease means you may also struggle with stressful emotions, such as sadness, fear, and worry. Here are some ways to manage these emotions: Talk to someone about your fear, anxiety, depression, or stress. Learn strategies to avoid or reduce stress and ask for help if you are struggling with depression or anxiety. Consider joining a COPD support group, online or in person.  Adjusting to changes COPD may limit the things you can do, but you can make certain changes to help you cope with the diagnosis. Ask for help when you need it. Getting support from friends, family, and your health care team is an important part of managing the condition. Try to get regular exercise as prescribed by a health care provider or pulmonary rehab team. Exercising can help COPD, even if you are a bit short of breath. Take steps to prevent infection and protect your lungs: Wash your hands often and avoid being in crowds. Stay away from friends and family members who are sick. Check your local air quality each day, and stay out of areas  where air pollution is likely. How to recognize changes in your condition Recognizing changes in your COPD COPD is a progressive disease. It is important to let the health care team know if your COPD is getting worse. Your treatment plan may need to change. Watch for: Increased shortness of breath, wheezing, cough, or fatigue. Loss of ability to exercise or perform daily activities, like climbing stairs. More frequent symptom flares. Signs of depression or anxiety. Recognizing stress It is normal to have additional stress when you have COPD. However, prolonged stress and anxiety can make COPD worse and lead to depression. Recognize the warning signs, which include: Feeling sad or worried more often or most of the time. Having less energy and losing interest in pleasurable activities. Changes in your appetite or sleeping patterns. Being easily angered or irritated. Having unexplained aches and pains, digestive problems, or headaches. Follow these instructions at home: Eating and drinking  Eat foods that are high in fiber, such as fresh fruits and vegetables, whole grains, and beans. Limit foods that are high in fat and processed sugars, such as fried or sweet foods. Follow a balanced diet and maintain a healthy weight. Being overweight or underweight can make COPD worse. You may work with a dietitian as part of your pulmonary rehab program. Drink enough fluid to keep your urine pale yellow. If you drink alcohol: Limit how much you have to: 0-1 drink a day for women who are not pregnant. 0-2 drinks a day for men. Know how much alcohol is in your drink. In the [place] one drink equals one 12 oz bottle   of beer (355 mL), one 5 oz glass of wine (148 mL), or one 1 oz glass of hard liquor (44 mL). Lifestyle If you smoke, the most important thing that you can do is to stop smoking. Continuing to smoke will cause the disease to progress faster. Do not use any products that contain nicotine or  tobacco. These products include cigarettes, chewing tobacco, and vaping devices, such as e-cigarettes. If you need help quitting, ask your health care provider. Avoid exposure to things that irritate your lungs, such as smoke, chemicals, and fumes. Activity Balance exercise and rest. Take short walks every 1-2 hours. This is important to improve blood flow and breathing. Ask for help if you feel weak or unsteady. Do exercises that include controlled breathing with body movement, such as tai chi. General instructions Take over-the-counter and prescription medicines only as told by your health care provider. Take vitamin and protein supplements as told by your health care provider or dietitian. Practice good oral hygiene and see your dental care provider regularly. An oral infection can also spread to your lungs. Make sure you receive all the vaccines that your health care provider recommends. Keep all follow-up visits. This is important. Contact a health care provider if you: Are struggling to manage your COPD. Have emotional stress that interferes with your ability to cope with COPD. Get help right away if you: Have thoughts of suicide, death, or hurting yourself or others. If you ever feel like you may hurt yourself or others, or have thoughts about taking your own life, get help right away. Go to your nearest emergency department or: Call your local emergency services (911 in the [place] Call a suicide crisis helpline, such as the National Suicide Prevention Lifeline at 1-800-273-8255 or 988 in the [place] This is open 24 hours a day in the [place] Text the Crisis Text Line at 741741 (in the [place] Summary Being diagnosed with chronic obstructive pulmonary disease (COPD) changes your life physically and emotionally. Work with your health care providers and follow your COPD management plan. A pulmonary rehabilitation program is an important part of COPD management. Prolonged stress, anxiety, and  depression can make COPD worse. Let your health care provider know if emotional stress interferes with your ability to cope with and manage COPD. This information is not intended to replace advice given to you by your health care provider. Make sure you discuss any questions you have with your health care provider. Document Revised: 03/27/2021 Document Reviewed: 09/19/2020 Elsevier Patient Education  2023 [employer]  

## 2022-05-12 ENCOUNTER — Ambulatory Visit (INDEPENDENT_AMBULATORY_CARE_PROVIDER_SITE_OTHER): Payer: 59 | Admitting: Nurse Practitioner

## 2022-05-12 ENCOUNTER — Encounter: Payer: Self-pay | Admitting: Nurse Practitioner

## 2022-05-12 ENCOUNTER — Other Ambulatory Visit: Payer: Self-pay | Admitting: Nurse Practitioner

## 2022-05-12 VITALS — BP 126/77 | HR 76 | Temp 98.5°F | Wt 139.1 lb

## 2022-05-12 DIAGNOSIS — F17219 Nicotine dependence, cigarettes, with unspecified nicotine-induced disorders: Secondary | ICD-10-CM

## 2022-05-12 DIAGNOSIS — J432 Centrilobular emphysema: Secondary | ICD-10-CM

## 2022-05-12 DIAGNOSIS — L4050 Arthropathic psoriasis, unspecified: Secondary | ICD-10-CM

## 2022-05-12 DIAGNOSIS — I700 Atherosclerosis of aorta: Secondary | ICD-10-CM

## 2022-05-12 DIAGNOSIS — E559 Vitamin D deficiency, unspecified: Secondary | ICD-10-CM

## 2022-05-12 DIAGNOSIS — F332 Major depressive disorder, recurrent severe without psychotic features: Secondary | ICD-10-CM | POA: Diagnosis not present

## 2022-05-12 DIAGNOSIS — M797 Fibromyalgia: Secondary | ICD-10-CM

## 2022-05-12 DIAGNOSIS — Z803 Family history of malignant neoplasm of breast: Secondary | ICD-10-CM

## 2022-05-12 DIAGNOSIS — F4310 Post-traumatic stress disorder, unspecified: Secondary | ICD-10-CM

## 2022-05-12 DIAGNOSIS — E7800 Pure hypercholesterolemia, unspecified: Secondary | ICD-10-CM

## 2022-05-12 DIAGNOSIS — E538 Deficiency of other specified B group vitamins: Secondary | ICD-10-CM

## 2022-05-12 MED ORDER — DULOXETINE HCL 60 MG PO CPEP: 120.0000 mg | ORAL_CAPSULE | Freq: Every day | ORAL | 4 refills | Status: DC

## 2022-05-12 MED ORDER — CLONAZEPAM 0.5 MG PO TABS: 0.5000 mg | ORAL_TABLET | Freq: Two times a day (BID) | ORAL | 0 refills | Status: DC | PRN

## 2022-05-12 MED ORDER — PREDNISONE 20 MG PO TABS: 40.0000 mg | ORAL_TABLET | Freq: Every day | ORAL | 0 refills | Status: AC

## 2022-05-12 NOTE — Assessment & Plan Note (Signed)
I have recommended complete cessation of tobacco use. I have discussed various options available for assistance with tobacco cessation including over the counter methods (Nicotine gum, patch and lozenges). We also discussed prescription options (Chantix, Nicotine Inhaler / Nasal Spray). The patient is not interested in pursuing any prescription tobacco cessation options at this time.  

## 2022-05-12 NOTE — Assessment & Plan Note (Signed)
Chronic, ongoing.  Continue Duloxetine for mood and pain, adjust medications as needed.   

## 2022-05-12 NOTE — Assessment & Plan Note (Signed)
Chronic, ongoing.  Discussed at length with patient and recommended complete cessation of smoking and educated patient on risk with smoking. Patient refuses at this time. Consider addition of statin and ASA in future. 

## 2022-05-12 NOTE — Progress Notes (Signed)
BP 126/77 (BP Location: Left Arm, Cuff Size: Normal)   Pulse 76   Temp 98.5 F (36.9 C) (Oral)   Wt 139 lb 1.6 oz (63.1 kg)   SpO2 97%   BMI 24.64 kg/m    Subjective:    Patient ID: Emily Johnston, female    DOB: Sep 13, 1961, 61 y.o.   MRN: [id number]  HPI: Emily Johnston is a 61 y.o. female  Chief Complaint  Patient presents with   COPD   Psoriasis   Depression   COPD Had lung CT CA screening 11/12/21, which showed no cancerous findings, but did note aortic atherosclerosis and emphysema ongoing. Continues to smoke -- smoking 1 PPD, has been smoking since age 62. Continues on Stiolto daily and Albuterol as needed.     Last exacerbation on 04/28/22 treated with Zpack, she continues to feel congestion and cough present.  No Prednisone given and Zpack has not worked well for her in past. COPD status: stable Satisfied with current treatment?: yes Oxygen use: no Dyspnea frequency: none Cough frequency: occasional Rescue inhaler frequency:  rarely, 1-2 times a month Limitation of activity: no Productive cough: none Last Spirometry: 05/12/22 = FEV1 66%, FEV1/FVC 99% Pneumovax: Up to Date Influenza: Up to Date    PSORIATIC ARTHRITIS & FIBROMYALGIA: Followed by rheumatology at [facility], last seen 04/30/22 and dermatology last 09/23/21.  She continues on Methotrexate weekly, Humira every two weeks, and folic acid.  She feels pain may be worsening at times.   Pain status: stable Satisfied with current treatment?: yes Previous pain specialty evaluation: no Non-narcotic analgesic meds: yes Narcotic contract:no Treatments attempted: rest, APAP, ibuprofen and physical therapy     DEPRESSION/PTSD Was followed by psychiatry team, [facility] in [place] -- last visit placed a new referral to psychiatry but she never scheduled, she would like to see another provider at this time.  Was taking Vyvanse, Trazodone, Zyprexa, Cymbalta, and Klonopin in past.  She needs Klonopin  refill for short period until sees psychiatry.  Last fill 02/03/22.  History of low Vitamin D, no current supplement. Mood status: stable Satisfied with current treatment?: yes Symptom severity: moderate Duration of current treatment : chronic Side effects: no Medication compliance: good compliance Psychotherapy/counseling: yes in past Depressed mood: yes Anxious mood: yes Anhedonia: no Significant weight loss or gain: no Insomnia: yes hard to stay asleep Fatigue: no Feelings of worthlessness or guilt: no Impaired concentration/indecisiveness: yes Suicidal ideations: no Hopelessness: no Crying spells: no    05/12/2022    2:41 PM 02/03/2022    3:02 PM 01/06/2022   10:07 AM 01/02/2022    2:29 PM 12/10/2021    2:37 PM  Depression screen PHQ 2/9  Decreased Interest 2 2 3 3 2   Down, Depressed, Hopeless 2 3 3 3 3   PHQ - 2 Score 4 5 6 6 5   Altered sleeping 2 2 3 3 2   Tired, decreased energy 3 3 3 3 3   Change in appetite 1 1 3 3 1   Feeling bad or failure about yourself  2 1 2 2  0  Trouble concentrating 3 3 3 3 3   Moving slowly or fidgety/restless 2 2 2 2 2   Suicidal thoughts 0 0 0 0 0  PHQ-9 Score 17 17 22 22 16   Difficult doing work/chores Very difficult  Very difficult Very difficult Very difficult       05/12/2022    2:42 PM 02/03/2022    3:02 PM 01/06/2022   10:07 AM 01/02/2022  2:30 PM  GAD 7 : Generalized Anxiety Score  Nervous, Anxious, on Edge 2 3 3 3   Control/stop worrying 2 2 3 3   Worry too much - different things 2 3 3 3   Trouble relaxing 2 3 3 3   Restless 2 3 2 3   Easily annoyed or irritable 2 2 2 2   Afraid - awful might happen 1 1 1 2   Total GAD 7 Score 13 17 17 19   Anxiety Difficulty Very difficult Very difficult Very difficult Very difficult   Relevant past medical, surgical, family and social history reviewed and updated as indicated. Interim medical history since our last visit reviewed. Allergies and medications reviewed and updated.  Review of Systems   Constitutional:  Negative for activity change, appetite change, diaphoresis, fatigue and fever.  Respiratory:  Negative for cough, chest tightness and shortness of breath.   Cardiovascular:  Negative for chest pain, palpitations and leg swelling.  Gastrointestinal: Negative.   Endocrine: Negative for cold intolerance and heat intolerance.  Musculoskeletal:  Positive for arthralgias.  Neurological: Negative.   Psychiatric/Behavioral:  Positive for sleep disturbance. Negative for decreased concentration, self-injury and suicidal ideas. The patient is nervous/anxious.     Per HPI unless specifically indicated above     Objective:    BP 126/77 (BP Location: Left Arm, Cuff Size: Normal)   Pulse 76   Temp 98.5 F (36.9 C) (Oral)   Wt 139 lb 1.6 oz (63.1 kg)   SpO2 97%   BMI 24.64 kg/m   Wt Readings from Last 3 Encounters:  05/12/22 139 lb 1.6 oz (63.1 kg)  04/28/22 141 lb 5 oz (64.1 kg)  02/03/22 141 lb 6.4 oz (64.1 kg)    Physical Exam Vitals and nursing note reviewed.  Constitutional:      General: She is awake. She is not in acute distress.    Appearance: She is well-developed and well-groomed. She is not ill-appearing.  HENT:     Head: Normocephalic.     Right Ear: Hearing normal.     Left Ear: Hearing normal.  Eyes:     General: Lids are normal.        Right eye: No discharge.        Left eye: No discharge.     Conjunctiva/sclera: Conjunctivae normal.     Pupils: Pupils are equal, round, and reactive to light.  Neck:     Thyroid: No thyromegaly.     Vascular: No carotid bruit.  Cardiovascular:     Rate and Rhythm: Normal rate and regular rhythm.     Heart sounds: Normal heart sounds. No murmur heard.    No gallop.  Pulmonary:     Effort: Pulmonary effort is normal. No accessory muscle usage or respiratory distress.     Breath sounds: Normal breath sounds. No decreased breath sounds, wheezing or rhonchi.  Abdominal:     General: Bowel sounds are normal. There is  no distension.     Palpations: Abdomen is soft.     Tenderness: There is no abdominal tenderness. There is no right CVA tenderness or left CVA tenderness.  Musculoskeletal:     Cervical back: Normal range of motion and neck supple.     Right lower leg: No edema.     Left lower leg: No edema.  Skin:    General: Skin is warm and dry.  Neurological:     Mental Status: She is alert and oriented to person, place, and time.  Psychiatric:  Attention and Perception: Attention normal.        Mood and Affect: Mood normal.        Speech: Speech normal.        Behavior: Behavior normal. Behavior is cooperative.        Thought Content: Thought content normal.    Results for orders placed or performed in visit on 01/06/22  CBC with Differential/Platelet  Result Value Ref Range   WBC 9.8 3.4 - 10.8 x10E3/uL   RBC 4.08 3.77 - 5.28 x10E6/uL   Hemoglobin 13.8 11.1 - 15.9 g/dL   Hematocrit 40.8 34.0 - 46.6 %   MCV 100 (H) 79 - 97 fL   MCH 33.8 (H) 26.6 - 33.0 pg   MCHC 33.8 31.5 - 35.7 g/dL   RDW 14.4 11.7 - 15.4 %   Platelets 278 150 - 450 x10E3/uL   Neutrophils 57 Not Estab. %   Lymphs 41 Not Estab. %   Monocytes 2 Not Estab. %   Eos 0 Not Estab. %   Basos 0 Not Estab. %   Neutrophils Absolute 5.6 1.4 - 7.0 x10E3/uL   Lymphocytes Absolute 4.0 (H) 0.7 - 3.1 x10E3/uL   Monocytes Absolute 0.2 0.1 - 0.9 x10E3/uL   EOS (ABSOLUTE) 0.0 0.0 - 0.4 x10E3/uL   Basophils Absolute 0.0 0.0 - 0.2 x10E3/uL   Immature Granulocytes 0 Not Estab. %   Immature Grans (Abs) 0.0 0.0 - 0.1 x10E3/uL  Comprehensive metabolic panel  Result Value Ref Range   Glucose 76 70 - 99 mg/dL   BUN 11 6 - 24 mg/dL   Creatinine, Ser 0.73 0.57 - 1.00 mg/dL   eGFR 95 >59 mL/min/1.73   BUN/Creatinine Ratio 15 9 - 23   Sodium 144 134 - 144 mmol/L   Potassium 3.6 3.5 - 5.2 mmol/L   Chloride 104 96 - 106 mmol/L   CO2 23 20 - 29 mmol/L   Calcium 9.4 8.7 - 10.2 mg/dL   Total Protein 6.2 6.0 - 8.5 g/dL   Albumin 4.0 3.8  - 4.9 g/dL   Globulin, Total 2.2 1.5 - 4.5 g/dL   Albumin/Globulin Ratio 1.8 1.2 - 2.2   Bilirubin Total 0.2 0.0 - 1.2 mg/dL   Alkaline Phosphatase 94 44 - 121 IU/L   AST 14 0 - 40 IU/L   ALT 13 0 - 32 IU/L  TSH  Result Value Ref Range   TSH 1.380 0.450 - 4.500 uIU/mL  Celiac Disease Panel  Result Value Ref Range   Endomysial IgA Negative Negative   Transglutaminase IgA <2 0 - 3 U/mL   IgA/Immunoglobulin A, Serum 234 87 - 352 mg/dL      Assessment & Plan:   Problem List Items Addressed This Visit       Cardiovascular and Mediastinum   Aortic atherosclerosis (HCC)    Chronic, ongoing.  Discussed at length with patient and recommended complete cessation of smoking and educated patient on risk with smoking. Patient refuses at this time. Consider addition of statin and ASA in future.      Relevant Orders   Comprehensive metabolic panel   Lipid Panel w/o Chol/HDL Ratio     Respiratory   Centrilobular emphysema (HCC) - Primary    Chronic, ongoing. Spirometry today FEV1 66%, FEV1/FVC 99%, similar to previous levels in 2020.   Continue current medication regimen and adjust as needed. Recommend complete cessation smoking.  Continue annual CT screening.  Continue Singulair to assist with exacerbations caused by allergies, discussed BLACK BOX  warning with her and she is aware to stop if any SI presents.       Relevant Medications   predniSONE (DELTASONE) 20 MG tablet   Other Relevant Orders   Spirometry with Graph (Completed)     Nervous and Auditory   Nicotine dependence, cigarettes, w unsp disorders    I have recommended complete cessation of tobacco use. I have discussed various options available for assistance with tobacco cessation including over the counter methods (Nicotine gum, patch and lozenges). We also discussed prescription options (Chantix, Nicotine Inhaler / Nasal Spray). The patient is not interested in pursuing any prescription tobacco cessation options at this  time.         Musculoskeletal and Integument   Psoriatic arthritis (HCC)    Chronic, ongoing.  Continue collaboration with rheumatology & dermatology at [facility] and current medication regimen.  Reviewed recent notes.  Will obtain CMP today.      Relevant Medications   methotrexate 50 MG/2ML injection   predniSONE (DELTASONE) 20 MG tablet   Other Relevant Orders   Comprehensive metabolic panel     Other   Elevated low density lipoprotein (LDL) cholesterol level    Noted past labs, recommend starting low dose Crestor.  Refuses at this time.  Lipid panel today and continue to recommend. The 10-year ASCVD risk score (Arnett DK, et al., 2019) is: 7.7%   Values used to calculate the score:     Age: 31 years     Sex: Female     Is Non-Hispanic African American: No     Diabetic: No     Tobacco smoker: Yes     Systolic Blood Pressure: 299 mmHg     Is BP treated: No     HDL Cholesterol: 47 mg/dL     Total Cholesterol: 208 mg/dL       Relevant Orders   Lipid Panel w/o Chol/HDL Ratio   Family history of breast cancer in mother    Up to Date on mammogram at this time, continue yearly.      Fibromyalgia    Chronic, ongoing.  Continue Duloxetine for mood and pain, adjust medications as needed.        Relevant Medications   DULoxetine (CYMBALTA) 60 MG capsule   predniSONE (DELTASONE) 20 MG tablet   clonazePAM (KLONOPIN) 0.5 MG tablet   PTSD (post-traumatic stress disorder)    Refer to depression plan of care.      Relevant Medications   DULoxetine (CYMBALTA) 60 MG capsule   Other Relevant Orders   Ambulatory referral to Psychiatry   Severe recurrent major depression without psychotic features (HCC)    Chronic, ongoing. Denies SI/HI.  Continue Duloxetine at this time and will send in short period of Klonopin refill until we can get her back into psychiatry, discussed with patient.  New referral to psychiatry placed today, she is to call and schedule with psychiatrist -- as they did  call her to schedule, provided her with number.        Relevant Medications   DULoxetine (CYMBALTA) 60 MG capsule   Other Relevant Orders   Ambulatory referral to Psychiatry   Vitamin D deficiency    Chronic, ongoing, recommend she take 2000 units Vitamin D3 daily and check level today.      Relevant Orders   VITAMIN D 25 Hydroxy (Vit-D Deficiency, Fractures)   Other Visit Diagnoses     Vitamin B12 deficiency       History of low levels reported,  check today and start supplement as needed.   Relevant Orders   Vitamin B12        Follow up plan: Return in about 6 months (around 11/12/2022) for Annual physical after 11/01/22.

## 2022-05-12 NOTE — Assessment & Plan Note (Signed)
Noted past labs, recommend starting low dose Crestor.  Refuses at this time.  Lipid panel today and continue to recommend. The 10-year ASCVD risk score (Arnett DK, et al., 2019) is: 7.7%   Values used to calculate the score:     Age: 61 years     Sex: Female     Is Non-Hispanic African American: No     Diabetic: No     Tobacco smoker: Yes     Systolic Blood Pressure: 283 mmHg     Is BP treated: No     HDL Cholesterol: 47 mg/dL     Total Cholesterol: 208 mg/dL

## 2022-05-12 NOTE — Assessment & Plan Note (Signed)
Chronic, ongoing, recommend she take 2000 units Vitamin D3 daily and check level today. 

## 2022-05-12 NOTE — Assessment & Plan Note (Signed)
Chronic, ongoing.  Continue collaboration with rheumatology & dermatology at [facility] and current medication regimen.  Reviewed recent notes.  Will obtain CMP today.

## 2022-05-12 NOTE — Assessment & Plan Note (Signed)
Chronic, ongoing. Denies SI/HI.  Continue Duloxetine at this time and will send in short period of Klonopin refill until we can get her back into psychiatry, discussed with patient.  New referral to psychiatry placed today, she is to call and schedule with psychiatrist -- as they did call her to schedule, provided her with number.

## 2022-05-12 NOTE — Assessment & Plan Note (Addendum)
Chronic, ongoing. Spirometry today FEV1 66%, FEV1/FVC 99%, similar to previous levels in 2020.   Continue current medication regimen and adjust as needed. Recommend complete cessation smoking.  Continue annual CT screening.  Continue Singulair to assist with exacerbations caused by allergies, discussed BLACK BOX warning with her and she is aware to stop if any SI presents.

## 2022-05-12 NOTE — Assessment & Plan Note (Signed)
Up to Date on mammogram at this time, continue yearly. 

## 2022-05-12 NOTE — Assessment & Plan Note (Signed)
Refer to depression plan of care. 

## 2022-05-13 LAB — COMPREHENSIVE METABOLIC PANEL
ALT: 13 IU/L (ref 0–32)
AST: 18 IU/L (ref 0–40)
Albumin/Globulin Ratio: 1.8 (ref 1.2–2.2)
Albumin: 4.1 g/dL (ref 3.8–4.9)
Alkaline Phosphatase: 113 IU/L (ref 44–121)
BUN/Creatinine Ratio: 12 (ref 12–28)
BUN: 9 mg/dL (ref 8–27)
Bilirubin Total: 0.2 mg/dL (ref 0.0–1.2)
CO2: 26 mmol/L (ref 20–29)
Calcium: 9.4 mg/dL (ref 8.7–10.3)
Chloride: 104 mmol/L (ref 96–106)
Creatinine, Ser: 0.77 mg/dL (ref 0.57–1.00)
Globulin, Total: 2.3 g/dL (ref 1.5–4.5)
Glucose: 82 mg/dL (ref 70–99)
Potassium: 4 mmol/L (ref 3.5–5.2)
Sodium: 143 mmol/L (ref 134–144)
Total Protein: 6.4 g/dL (ref 6.0–8.5)
eGFR: 88 mL/min/{1.73_m2} (ref >59)

## 2022-05-13 LAB — VITAMIN D 25 HYDROXY (VIT D DEFICIENCY, FRACTURES): Vit D, 25-Hydroxy: 20.6 ng/mL — ABNORMAL LOW (ref 30.0–100.0)

## 2022-05-13 LAB — VITAMIN B12: Vitamin B-12: 301 pg/mL (ref 232–1245)

## 2022-05-13 LAB — LIPID PANEL W/O CHOL/HDL RATIO
Cholesterol, Total: 198 mg/dL (ref 100–199)
HDL: 55 mg/dL (ref >39)
LDL Chol Calc (NIH): 118 mg/dL — ABNORMAL HIGH (ref 0–99)
Triglycerides: 141 mg/dL (ref 0–149)
VLDL Cholesterol Cal: 25 mg/dL (ref 5–40)

## 2022-05-13 NOTE — Telephone Encounter (Signed)
Requested medication (s) are due for refill today - no  Requested medication (s) are on the active medication list -yes  Future visit scheduled -yes  Last refill: 05/12/22 #60  Notes to clinic: duplicate request- non delegated Rx  Requested Prescriptions  Pending Prescriptions Disp Refills   clonazePAM (KLONOPIN) 0.5 MG tablet [Pharmacy Med Name: CLONAZEPAM 0.5 MG TABLET] 60 tablet 0    Sig: Take 1 tablet (0.5 mg total) by mouth 2 (two) timesdaily as needed for anxiety.     Not Delegated - Psychiatry: Anxiolytics/Hypnotics 2 Failed - 05/12/2022  4:16 PM      Failed - This refill cannot be delegated      Failed - Urine Drug Screen completed in last 360 days      Passed - Patient is not pregnant      Passed - Valid encounter within last 6 months    Recent Outpatient Visits           Yesterday Centrilobular emphysema ([place])   [place], [name] T, NP   3 months ago Severe recurrent major depression without psychotic features ([place])   [place], [name] T, NP   4 months ago Centrilobular emphysema ([place])   [place], Henrine Screws T, NP   4 months ago Functional diarrhea   [facility] Vigg, Avanti, MD   5 months ago [place], MD       Future Appointments             In 6 months Cannady, Gu-Win T, NP [employer]               Requested Prescriptions  Pending Prescriptions Disp Refills   clonazePAM (KLONOPIN) 0.5 MG tablet [Pharmacy Med Name: CLONAZEPAM 0.5 MG TABLET] 60 tablet 0    Sig: Take 1 tablet (0.5 mg total) by mouth 2 (two) timesdaily as needed for anxiety.     Not Delegated - Psychiatry: Anxiolytics/Hypnotics 2 Failed - 05/12/2022  4:16 PM      Failed - This refill cannot be delegated      Failed - Urine Drug Screen completed in last 360 days      Passed - Patient is not pregnant      Passed - Valid encounter within last 6  months    Recent Outpatient Visits           Yesterday Centrilobular emphysema ([place])   [place], [name] T, NP   3 months ago Severe recurrent major depression without psychotic features ([place])   [place], [name] T, NP   4 months ago Centrilobular emphysema ([place])   [place], Henrine Screws T, NP   4 months ago Functional diarrhea   [facility] Vigg, Avanti, MD   5 months ago [place], MD       Future Appointments             In 6 months Cannady, Barbaraann Faster, NP [facility] [facility]

## 2022-05-13 NOTE — Progress Notes (Signed)
Contacted via MyChart The 10-year ASCVD risk score (Arnett DK, et al., 2019) is: 6.6%   Values used to calculate the score:     Age: 61 years     Sex: Female     Is Non-Hispanic African American: No     Diabetic: No     Tobacco smoker: Yes     Systolic Blood Pressure: 076 mmHg     Is BP treated: No     HDL Cholesterol: 55 mg/dL     Total Cholesterol: 198 mg/dL   Good evening Emily Johnston, your labs have returned: - B12 is on low side of normal -- please start taking over the counter Vitamin B12 1000 MCG daily as this can help levels and help prevent nerve pain and brain fog.   - Vitamin D remains on low side.  Recommend you take Vitamin D3 2000 units daily for bone health. - Your LDL is above normal. The LDL is the bad cholesterol. Over time and in combination with inflammation and other factors, this contributes to plaque which in turn may lead to stroke and/or heart attack down the road. Sometimes high LDL is primarily genetic, and people might be eating all the right foods but still have high numbers. Other times, there is room for improvement in one's diet and eating healthier can bring this number down and potentially reduce one's risk of heart attack and/or stroke.   To reduce your LDL, Remember - more fruits and vegetables, more fish, and limit red meat and dairy products. More soy, nuts, beans, barley, lentils, oats and plant sterol ester enriched margarine instead of butter. I also encourage eliminating sugar and processed food. Remember, shop on the outside of the grocery store and visit your [employer]. If you would like to talk with me about dietary changes for your cholesterol, please let me know. We should recheck your cholesterol in 6 months.  Any questions? Keep being amazing!!  Thank you for allowing me to participate in your care.  I appreciate you. Kindest regards, Worthy Boschert

## 2022-05-23 ENCOUNTER — Other Ambulatory Visit: Payer: Self-pay | Admitting: Nurse Practitioner

## 2022-05-23 ENCOUNTER — Encounter: Payer: Self-pay | Admitting: Nurse Practitioner

## 2022-05-23 MED ORDER — AMOXICILLIN 875 MG PO TABS: 875.0000 mg | ORAL_TABLET | Freq: Two times a day (BID) | ORAL | 0 refills | Status: DC

## 2022-05-23 MED ORDER — AZITHROMYCIN 250 MG PO TABS: ORAL_TABLET | ORAL | 0 refills | Status: DC

## 2022-05-26 MED ORDER — AMOXICILLIN 875 MG PO TABS
875.0000 mg | ORAL_TABLET | Freq: Two times a day (BID) | ORAL | 0 refills | Status: AC
Start: 2022-05-26 — End: 2022-06-02

## 2022-05-26 NOTE — Addendum Note (Signed)
Addended by: Marnee Guarneri T on: 05/26/2022 12:22 PM   Modules accepted: Orders

## 2022-06-02 ENCOUNTER — Other Ambulatory Visit: Payer: Self-pay | Admitting: Nurse Practitioner

## 2022-06-02 MED ORDER — FLUTICASONE PROPIONATE 50 MCG/ACT NA SUSP: 2.0000 | Freq: Every day | NASAL | 4 refills | Status: DC

## 2022-06-02 NOTE — Telephone Encounter (Signed)
LOV 05/12/22  Future visit 11/13/22

## 2022-06-03 NOTE — Unmapped (Signed)
[facility] Refill Coordination Note    Specialty Medication(s) to be Shipped:   Inflammatory Disorders: Humira    Other medication(s) to be shipped:  syringes     Michele Miller, DOB: 11-02-1960  Phone: [phone] (home)       All above HIPAA information was verified with patient.     Was a Nurse, learning disability used for this call? No    Completed refill call assessment today to schedule patient's medication shipment from the [facility] [phone]).  All relevant notes have been reviewed.     Specialty medication(s) and dose(s) confirmed: Regimen is correct and unchanged.   Changes to medications: [name] reports no changes at this time.  Changes to insurance: No  New side effects reported not previously addressed with a pharmacist or physician: None reported  Questions for the pharmacist: No    Confirmed patient received a Conservation officer, historic buildings and a Surveyor, mining with first shipment. The patient will receive a drug information handout for each medication shipped and additional FDA Medication Guides as required.       DISEASE/MEDICATION-SPECIFIC INFORMATION        For patients on injectable medications: Patient currently has 1 doses left.  Next injection is scheduled for 09/2.    SPECIALTY MEDICATION ADHERENCE     Medication Adherence    Patient reported X missed doses in the last month: 0  Specialty Medication: Humira  Patient is on additional specialty medications: No  Patient is on more than two specialty medications: No  Any gaps in refill history greater than 2 weeks in the last 3 months: no  Demonstrates understanding of importance of adherence: yes  Informant: patient  Reliability of informant: reliable  Provider-estimated medication adherence level: good  Patient is at risk for Non-Adherence: No  Reasons for non-adherence: no problems identified                  Confirmed plan for next specialty medication refill: delivery by pharmacy  Refills needed for supportive medications: not needed          Refill Coordination    Has the Patients' Contact Information Changed: No  Is the Shipping Address Different: No         Were doses missed due to medication being on hold? No    humira 40/0.4 mg/ml: 14 days of medicine on hand       REFERRAL TO PHARMACIST     Referral to the pharmacist: Not needed      [facility]     Shipping address confirmed in Epic.     Delivery Scheduled: Yes, Expected medication delivery date: 09/28.     Medication will be delivered via Same Day Courier to the prescription address in Epic WAM.    Antonietta Barcelona   [facility] Specialty Technician

## 2022-06-06 ENCOUNTER — Ambulatory Visit
Admission: RE | Admit: 2022-06-06 | Discharge: 2022-06-06 | Disposition: A | Discharge status: Home / Self Care | Payer: 59 | Source: Ambulatory Visit | Attending: Emergency Medicine | Admitting: Emergency Medicine

## 2022-06-06 VITALS — BP 116/78 | HR 91 | Temp 98.7°F | Ht 64.0 in | Wt 136.0 lb

## 2022-06-06 DIAGNOSIS — L03116 Cellulitis of left lower limb: Secondary | ICD-10-CM | POA: Diagnosis not present

## 2022-06-06 MED ORDER — CLINDAMYCIN HCL 300 MG PO CAPS: 300.0000 mg | ORAL_CAPSULE | Freq: Three times a day (TID) | ORAL | 0 refills | Status: AC

## 2022-06-06 NOTE — ED Triage Notes (Signed)
Pt c/o possible insect bite back of RT heel, pt states has been there x3 weeks,  pt states has been applying hydrogen peroxide & neosporin but not getting any better

## 2022-06-06 NOTE — Discharge Instructions (Signed)
Take the Clindamycin three times daily with food for 7 days.  Stop applying hydrogen peroxide and leave the area open to air so that it dries out and heals.   Use OTC Tylenol and Ibuprofen according to the package instructions as needed for pain.  Return for new or worsening symptoms.

## 2022-06-06 NOTE — ED Provider Notes (Signed)
[facility]    CSN: [id number] Arrival date & time: 06/06/22  1251      History   Chief Complaint Chief Complaint  Patient presents with   Insect Bite    HPI Emily Johnston is a 61 y.o. female.   HPI  61 year old female here for evaluation of possible insect bite.  Patient reports that she has been experiencing a lesion on the back of her right heel that she thinks came from an insect bite but has not healed for the past 3 weeks.  She states that she has been cleaning it with hydroperoxide, covering with a Band-Aid, and his use Neosporin once or twice.  She denies any injury, fever, or drainage from the wound.  Past Medical History:  Diagnosis Date   Anxiety    Arthritis    COPD (chronic obstructive pulmonary disease) (HCC)    Depression    Fibromyalgia    IBS (irritable bowel syndrome)    PTSD (post-traumatic stress disorder)     Patient Active Problem List   Diagnosis Date Noted   Vitamin D deficiency 04/27/2022   Elevated low density lipoprotein (LDL) cholesterol level 05/04/2021   Family history of breast cancer in mother 10/29/2020   Basal cell carcinoma (BCC) 07/10/2020   Spondylosis of cervical spine with myelopathy and radiculopathy 10/24/2019   Aortic atherosclerosis ([place]) 06/15/2019   Psoriatic arthritis ([place]) 03/22/2019   Centrilobular emphysema ([place]) 02/22/2019   Nicotine dependence, cigarettes, w unsp disorders 02/22/2019   IBS (irritable bowel syndrome) 11/01/2018   Severe recurrent major depression without psychotic features ([place]) 01/12/2018   PTSD (post-traumatic stress disorder) 01/12/2018   Fibromyalgia 01/12/2018    Past Surgical History:  Procedure Laterality Date   ABDOMINAL HYSTERECTOMY     CARPAL TUNNEL RELEASE     KNEE SURGERY      OB History   No obstetric history on file.      Home Medications    Prior to Admission medications   Medication Sig Start Date End Date Taking? Authorizing Provider  acitretin  (SORIATANE) 25 MG capsule Take 1 capsule by mouth daily. 04/30/20  Yes [provider]  albuterol (VENTOLIN HFA) 108 (90 Base) MCG/ACT inhaler Inhale 2 puffs into the lungs every 6 (six) hours as needed for wheezing or shortness of breath. 09/04/20  Yes Johnson, Megan P, DO  clindamycin (CLEOCIN) 300 MG capsule Take 1 capsule (300 mg total) by mouth 3 (three) times daily for 7 days. 06/06/22 06/13/22 Yes Margarette Canada, NP  clobetasol ointment (TEMOVATE) 2.70 % Apply 1 application. topically 2 (two) times daily. 09/24/21  Yes [provider]  clonazePAM (KLONOPIN) 0.5 MG tablet Take 1 tablet (0.5 mg total) by mouth 2 (two) times daily as needed for anxiety. 05/12/22  Yes Cannady, [name] T, NP  DULoxetine (CYMBALTA) 60 MG capsule Take 2 capsules (120 mg total) by mouth daily. 05/12/22  Yes Cannady, [name] T, NP  fluticasone (FLONASE) 50 MCG/ACT nasal spray Place 2 sprays into both nostrils daily. 06/02/22  Yes Cannady, [name] T, NP  folic acid (FOLVITE) 1 MG tablet Take 1 mg by mouth daily.   Yes [provider]  gabapentin (NEURONTIN) 300 MG capsule Take 300 mg by mouth 3 (three) times daily.   Yes [provider]  lactobacillus acidophilus & bulgar (LACTINEX) chewable tablet Chew 1 tablet by mouth 3 (three) times daily with meals. 12/10/21  Yes Vigg, Avanti, MD  meclizine (ANTIVERT) 12.5 MG tablet Take 1 tablet (12.5 mg total)  by mouth 3 (three) times daily as needed for dizziness. 09/04/20  Yes Johnson, Megan P, DO  methotrexate 50 MG/2ML injection Inject 8 mLs into the skin once a week. 05/01/22  Yes [provider]  montelukast (SINGULAIR) 10 MG tablet Take 1 tablet (10 mg total) by mouth at bedtime. 01/06/22  Yes Cannady, Henrine Screws T, NP  Multiple Vitamin (MULTIVITAMIN) tablet Take 1 tablet by mouth daily.   Yes [provider]  STIOLTO RESPIMAT 2.5-2.5 MCG/ACT AERS INHALE 2 PUFFS BY MOUTH EVERY DAY 01/27/22  Yes Cannady, [name] T, NP  Adalimumab (HUMIRA PEN)  40 MG/0.4ML PNKT Inject into the skin. 02/10/20 05/12/22  [provider]    Family History Family History  Problem Relation Age of Onset   Breast cancer Mother 65    Social History Social History   Tobacco Use   Smoking status: Every Day    Packs/day: 1.00    Years: 41.00    Total pack years: 41.00    Types: Cigarettes   Smokeless tobacco: Never  Vaping Use   Vaping Use: Never used  Substance Use Topics   Alcohol use: No   Drug use: No     Allergies   Patient has no known allergies.   Review of Systems Review of Systems  Constitutional:  Negative for fever.  Skin:  Positive for color change and wound.     Physical Exam Triage Vital Signs ED Triage Vitals  Enc Vitals Group     BP 06/06/22 1303 116/78     Pulse Rate 06/06/22 1303 91     Resp --      Temp 06/06/22 1303 98.7 F (37.1 C)     Temp Source 06/06/22 1303 Oral     SpO2 06/06/22 1303 94 %     Weight 06/06/22 1301 136 lb (61.7 kg)     Height 06/06/22 1301 '5\' 4"'$  (1.626 m)     Head Circumference --      Peak Flow --      Pain Score 06/06/22 1301 6     Pain Loc --      Pain Edu? --      Excl. in [place]? --    No data found.  Updated Vital Signs BP 116/78 (BP Location: Left Arm)   Pulse 91   Temp 98.7 F (37.1 C) (Oral)   Ht '5\' 4"'$  (1.626 m)   Wt 136 lb (61.7 kg)   SpO2 94%   BMI 23.34 kg/m   Visual Acuity Right Eye Distance:   Left Eye Distance:   Bilateral Distance:    Right Eye Near:   Left Eye Near:    Bilateral Near:     Physical Exam Vitals and nursing note reviewed.  Constitutional:      Appearance: Normal appearance. She is not ill-appearing.  HENT:     Head: Normocephalic and atraumatic.  Skin:    General: Skin is warm and dry.     Capillary Refill: Capillary refill takes less than 2 seconds.     Findings: Erythema and lesion present.  Neurological:     General: No focal deficit present.     Mental Status: She is alert and oriented to person, place, and time.   Psychiatric:        Mood and Affect: Mood normal.        Behavior: Behavior normal.        Thought Content: Thought content normal.        Judgment: Judgment  normal.      UC Treatments / Results  Labs (all labs ordered are listed, but only abnormal results are displayed) Labs Reviewed - No data to display  EKG   Radiology No results found.  Procedures Procedures (including critical care time)  Medications Ordered in UC Medications - No data to display  Initial Impression / Assessment and Plan / UC Course  I have reviewed the triage vital signs and the nursing notes.  Pertinent labs & imaging results that were available during my care of the patient were reviewed by me and considered in my medical decision making (see chart for details).   Patient is a pleasant, nontoxic-appearing 61 year old female here for evaluation of a nonhealing wound on her right heel that has been present for last 3 weeks.  The wound is approximately size of a dime and has some surrounding erythema that is approximately size of a quarter.  The erythema is tender to touch but it is not hot, fluctuant, or indurated.  The central aspect is scabbed over and there is no appreciable drainage.  The original source of the wound is unclear.  Patient states that she thinks she was bit by something but she does not member stepping on anything in the yard and she has not been wearing any shoes other than flip-flops to cause rubbing on her heel.  She states in the interval time she was on a round of antibiotics for sinus infection but this did not improve the wound on the back of her heel.  She states that it is very tender.  Given the erythema and tenderness and concern it might possibly be a MRSA infection.  Doxycycline and Bactrim both interfere with medication she is taking so I will put the patient on clindamycin 300 mg 3 times a day for a week.  I have advised her to stop applying the hydroperoxide, do not apply  Neosporin, and leave the wound open to air so that it can heal.  If the wound does not heal, or improve, she is to return for reevaluation or see her primary care provider.   Final Clinical Impressions(s) / UC Diagnoses   Final diagnoses:  Cellulitis of left lower extremity     Discharge Instructions      Take the Clindamycin three times daily with food for 7 days.  Stop applying hydrogen peroxide and leave the area open to air so that it dries out and heals.   Use OTC Tylenol and Ibuprofen according to the package instructions as needed for pain.  Return for new or worsening symptoms.       ED Prescriptions     Medication Sig Dispense Auth. Provider   clindamycin (CLEOCIN) 300 MG capsule Take 1 capsule (300 mg total) by mouth 3 (three) times daily for 7 days. 21 capsule Margarette Canada, NP      PDMP not reviewed this encounter.   Margarette Canada, NP 06/06/22 1322

## 2022-06-13 ENCOUNTER — Ambulatory Visit
Admission: EM | Admit: 2022-06-13 | Discharge: 2022-06-13 | Disposition: A | Discharge status: Home / Self Care | Payer: 59 | Attending: Physician Assistant | Admitting: Physician Assistant

## 2022-06-13 ENCOUNTER — Encounter: Payer: Self-pay | Admitting: Emergency Medicine

## 2022-06-13 DIAGNOSIS — L03115 Cellulitis of right lower limb: Secondary | ICD-10-CM | POA: Diagnosis not present

## 2022-06-13 DIAGNOSIS — D849 Immunodeficiency, unspecified: Secondary | ICD-10-CM

## 2022-06-13 DIAGNOSIS — S91301A Unspecified open wound, right foot, initial encounter: Secondary | ICD-10-CM

## 2022-06-13 DIAGNOSIS — F17200 Nicotine dependence, unspecified, uncomplicated: Secondary | ICD-10-CM | POA: Diagnosis not present

## 2022-06-13 MED ORDER — CEPHALEXIN 500 MG PO CAPS: 500.0000 mg | ORAL_CAPSULE | Freq: Four times a day (QID) | ORAL | 0 refills | Status: AC

## 2022-06-13 MED ORDER — MUPIROCIN 2 % EX OINT: 1.0000 | TOPICAL_OINTMENT | Freq: Two times a day (BID) | CUTANEOUS | 0 refills | Status: DC

## 2022-06-13 NOTE — Discharge Instructions (Addendum)
-  Clean with soap and water every day and apply mupirocin ointment and cover with a bandage. - I sent more antibiotics to pharmacy.  This 1 is different. - I placed referral to wound care center for you.

## 2022-06-13 NOTE — ED Provider Notes (Signed)
[facility]    CSN: [id number] Arrival date & time: 06/13/22  1310      History   Chief Complaint Chief Complaint  Patient presents with   Cellulitis    HPI Emily Johnston is a 61 y.o. female returning for poorly healing wound of the right heel.  Patient was seen here 1 week ago and treated with clindamycin for suspected MRSA.  Patient reports the initial symptoms began about a month ago and have not gotten any better.  She says the redness looks better after taking the antibiotic but the wound looks about the same.  She denies any known injuries.  Patient is committed she was bitten by a spider.  She did not see a spider or insect.  She thinks she is to have a low-grade fever but also reports that she has had a bit of a cough recently.  Patient's medical history significant for COPD, depression, fibromyalgia, psoriatic arthritis, chronic tobacco abuse, anxiety.  No history of diabetes or venous disease.  She does take methotrexate and Humira.  No other complaints.  HPI  Past Medical History:  Diagnosis Date   Anxiety    Arthritis    COPD (chronic obstructive pulmonary disease) (HCC)    Depression    Fibromyalgia    IBS (irritable bowel syndrome)    PTSD (post-traumatic stress disorder)     Patient Active Problem List   Diagnosis Date Noted   Vitamin D deficiency 04/27/2022   Elevated low density lipoprotein (LDL) cholesterol level 05/04/2021   Family history of breast cancer in mother 10/29/2020   Basal cell carcinoma (BCC) 07/10/2020   Spondylosis of cervical spine with myelopathy and radiculopathy 10/24/2019   Aortic atherosclerosis ([place]) 06/15/2019   Psoriatic arthritis ([place]) 03/22/2019   Centrilobular emphysema ([place]) 02/22/2019   Nicotine dependence, cigarettes, w unsp disorders 02/22/2019   IBS (irritable bowel syndrome) 11/01/2018   Severe recurrent major depression without psychotic features ([place]) 01/12/2018   PTSD (post-traumatic stress  disorder) 01/12/2018   Fibromyalgia 01/12/2018    Past Surgical History:  Procedure Laterality Date   ABDOMINAL HYSTERECTOMY     CARPAL TUNNEL RELEASE     KNEE SURGERY      OB History   No obstetric history on file.      Home Medications    Prior to Admission medications   Medication Sig Start Date End Date Taking? Authorizing Provider  acitretin (SORIATANE) 25 MG capsule Take 1 capsule by mouth daily. 04/30/20  Yes [provider]  albuterol (VENTOLIN HFA) 108 (90 Base) MCG/ACT inhaler Inhale 2 puffs into the lungs every 6 (six) hours as needed for wheezing or shortness of breath. 09/04/20  Yes Johnson, Megan P, DO  cephALEXin (KEFLEX) 500 MG capsule Take 1 capsule (500 mg total) by mouth 4 (four) times daily for 7 days. 06/13/22 06/20/22 Yes Danton Clap, PA-C  clindamycin (CLEOCIN) 300 MG capsule Take 1 capsule (300 mg total) by mouth 3 (three) times daily for 7 days. 06/06/22 06/13/22 Yes Margarette Canada, NP  clobetasol ointment (TEMOVATE) 3.42 % Apply 1 application. topically 2 (two) times daily. 09/24/21  Yes [provider]  clonazePAM (KLONOPIN) 0.5 MG tablet Take 1 tablet (0.5 mg total) by mouth 2 (two) times daily as needed for anxiety. 05/12/22  Yes Cannady, [name] T, NP  DULoxetine (CYMBALTA) 60 MG capsule Take 2 capsules (120 mg total) by mouth daily. 05/12/22  Yes Cannady, [name] T, NP  fluticasone (FLONASE) 50 MCG/ACT nasal spray Place  2 sprays into both nostrils daily. 06/02/22  Yes Cannady, [name] T, NP  folic acid (FOLVITE) 1 MG tablet Take 1 mg by mouth daily.   Yes [provider]  gabapentin (NEURONTIN) 300 MG capsule Take 300 mg by mouth 3 (three) times daily.   Yes [provider]  lactobacillus acidophilus & bulgar (LACTINEX) chewable tablet Chew 1 tablet by mouth 3 (three) times daily with meals. 12/10/21  Yes Vigg, Avanti, MD  meclizine (ANTIVERT) 12.5 MG tablet Take 1 tablet (12.5 mg total) by mouth 3 (three) times daily as needed  for dizziness. 09/04/20  Yes Johnson, Megan P, DO  methotrexate 50 MG/2ML injection Inject 8 mLs into the skin once a week. 05/01/22  Yes [provider]  montelukast (SINGULAIR) 10 MG tablet Take 1 tablet (10 mg total) by mouth at bedtime. 01/06/22  Yes Cannady, Henrine Screws T, NP  Multiple Vitamin (MULTIVITAMIN) tablet Take 1 tablet by mouth daily.   Yes [provider]  mupirocin ointment (BACTROBAN) 2 % Apply 1 Application topically 2 (two) times daily. 06/13/22  Yes Laurene Footman B, PA-C  STIOLTO RESPIMAT 2.5-2.5 MCG/ACT AERS INHALE 2 PUFFS BY MOUTH EVERY DAY 01/27/22  Yes Cannady, [name] T, NP  Adalimumab (HUMIRA PEN) 40 MG/0.4ML PNKT Inject into the skin. 02/10/20 05/12/22  [provider]    Family History Family History  Problem Relation Age of Onset   Breast cancer Mother 40    Social History Social History   Tobacco Use   Smoking status: Every Day    Packs/day: 1.00    Years: 41.00    Total pack years: 41.00    Types: Cigarettes   Smokeless tobacco: Never  Vaping Use   Vaping Use: Never used  Substance Use Topics   Alcohol use: No   Drug use: No     Allergies   Patient has no known allergies.   Review of Systems Review of Systems  Constitutional:  Positive for fever. Negative for fatigue.  Musculoskeletal:  Positive for arthralgias and joint swelling.  Skin:  Positive for color change and wound.  Neurological:  Negative for weakness and numbness.     Physical Exam Triage Vital Signs ED Triage Vitals  Enc Vitals Group     BP 06/13/22 1354 (!) 142/94     Pulse Rate 06/13/22 1354 77     Resp 06/13/22 1354 16     Temp 06/13/22 1354 98.1 F (36.7 C)     Temp Source 06/13/22 1354 Oral     SpO2 06/13/22 1354 96 %     Weight 06/13/22 1354 136 lb 0.4 oz (61.7 kg)     Height 06/13/22 1354 '5\' 4"'$  (1.626 m)     Head Circumference --      Peak Flow --      Pain Score 06/13/22 1353 4     Pain Loc --      Pain Edu? --      Excl. in [place]? --     No data found.  Updated Vital Signs BP (!) 142/94 (BP Location: Left Arm)   Pulse 77   Temp 98.1 F (36.7 C) (Oral)   Resp 16   Ht '5\' 4"'$  (1.626 m)   Wt 136 lb 0.4 oz (61.7 kg)   SpO2 96%   BMI 23.35 kg/m      Physical Exam Vitals and nursing note reviewed.  Constitutional:      General: She is not in acute distress.    Appearance:  Normal appearance. She is not ill-appearing or toxic-appearing.  HENT:     Head: Normocephalic and atraumatic.  Eyes:     General: No scleral icterus.       Right eye: No discharge.        Left eye: No discharge.     Conjunctiva/sclera: Conjunctivae normal.  Cardiovascular:     Rate and Rhythm: Normal rate and regular rhythm.     Heart sounds: Normal heart sounds.  Pulmonary:     Effort: Pulmonary effort is normal. No respiratory distress.     Breath sounds: Normal breath sounds.  Musculoskeletal:     Cervical back: Neck supple.  Skin:    General: Skin is dry.     Comments: See image included in chart.  There is a shallow wound of the right posterior heel with some slight swelling and faint erythema surrounding.  Much improved from 1 week ago.  Neurological:     General: No focal deficit present.     Mental Status: She is alert. Mental status is at baseline.     Motor: No weakness.     Gait: Gait normal.  Psychiatric:        Mood and Affect: Mood normal.        Behavior: Behavior normal.        Thought Content: Thought content normal.       UC Treatments / Results  Labs (all labs ordered are listed, but only abnormal results are displayed) Labs Reviewed - No data to display  EKG   Radiology No results found.  Procedures Procedures (including critical care time)  Medications Ordered in UC Medications - No data to display  Initial Impression / Assessment and Plan / UC Course  I have reviewed the triage vital signs and the nursing notes.  Pertinent labs & imaging results that were available during my care of the  patient were reviewed by me and considered in my medical decision making (see chart for details).   61 year old female presenting for evaluation of poorly healing wound of the right heel.  Patient seen a week ago and treated with clindamycin.  The redness and swelling have improved but the wound remains.  She says it does not feel any less painful than when she was here a week ago.  Believes she has had some low-grade fevers.  Image included in chart and compared to 1 week ago.  It definitely looks less erythematous and swollen than it did previously.  However, since it is still unhealed, will refer her to wound care.  I have also discussed cleaning it daily with soap and water and apply mupirocin ointment and covering with nonadherent pad and loose bandage.  Starting on Keflex.  Close monitoring.  ER precautions given, otherwise follow-up with wound care.   Final Clinical Impressions(s) / UC Diagnoses   Final diagnoses:  Non-healing wound of right heel  Cellulitis of right lower extremity     Discharge Instructions      -Clean with soap and water every day and apply mupirocin ointment and cover with a bandage. - I sent more antibiotics to pharmacy.  This 1 is different. - I placed referral to wound care center for you.     ED Prescriptions     Medication Sig Dispense Auth. Provider   cephALEXin (KEFLEX) 500 MG capsule Take 1 capsule (500 mg total) by mouth 4 (four) times daily for 7 days. 28 capsule Laurene Footman B, PA-C   mupirocin ointment Drue Stager)  2 % Apply 1 Application topically 2 (two) times daily. 22 g Danton Clap, PA-C      PDMP not reviewed this encounter.   Danton Clap, PA-C 06/13/22 1444

## 2022-06-13 NOTE — ED Triage Notes (Signed)
Pt was seen 7 days ago for cellulitis on her left heel. She has been taking the abx prescribed and states she has 1 left but does not feel like the wound has not healed up properly. She states she has had a low grade fever, and nausea. She states the area is still very tender and still has some redness.

## 2022-06-16 MED FILL — BD TUBERCULIN SYRINGE 1 ML 27 X 1/2": 28 days supply | Qty: 4 | Fill #6

## 2022-06-16 MED FILL — HUMIRA PEN CITRATE FREE 40 MG/0.4 ML: SUBCUTANEOUS | 28 days supply | Qty: 2 | Fill #7

## 2022-07-04 ENCOUNTER — Ambulatory Visit
Admit: 2022-07-04 | Discharge: 2022-07-05 | Payer: MEDICARE | Attending: Orthopaedic Surgery | Primary: Orthopaedic Surgery

## 2022-07-04 DIAGNOSIS — M542 Cervicalgia: Principal | ICD-10-CM

## 2022-07-04 DIAGNOSIS — G8929 Other chronic pain: Principal | ICD-10-CM
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
## 2022-07-07 IMAGING — DX DG CHEST 2V
2 series · 2 of 2 positions shown · non-contrast
Comparison: February 19, 2017

CLINICAL DATA: Productive cough with wheezing and shortness of
breath on exertion.

EXAM:
CHEST - 2 VIEW

[chest pa]
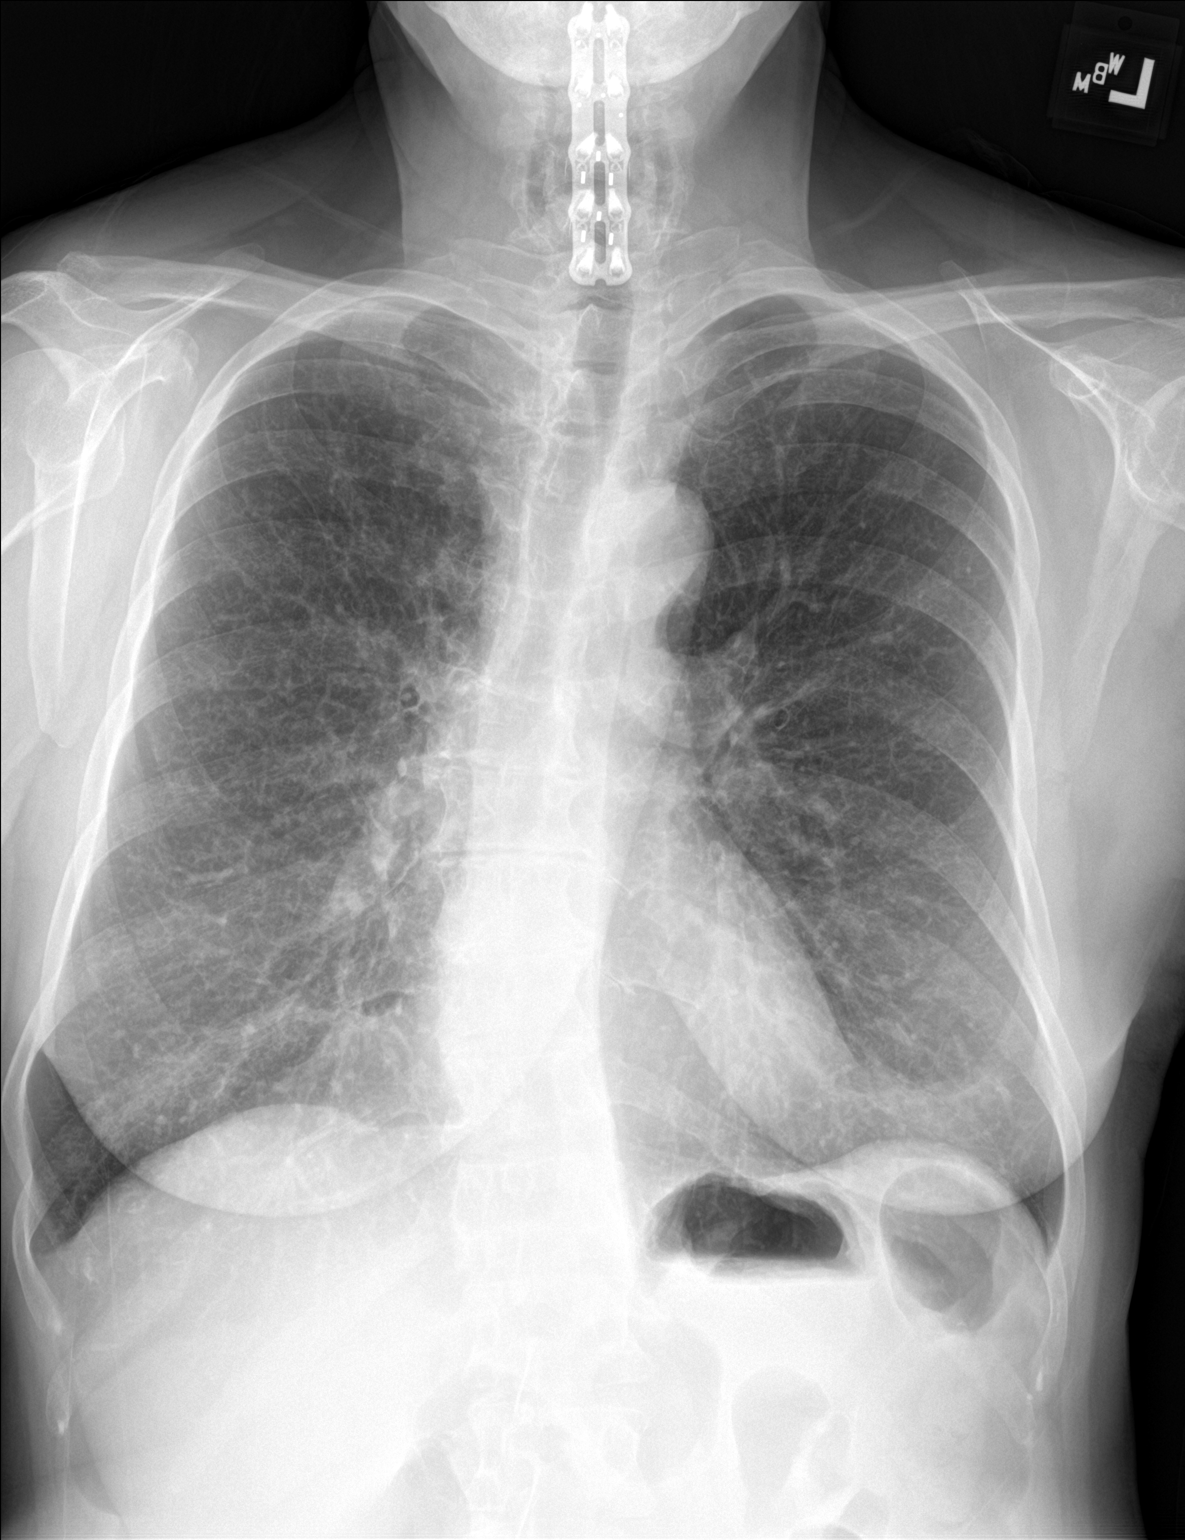

[chest lat]
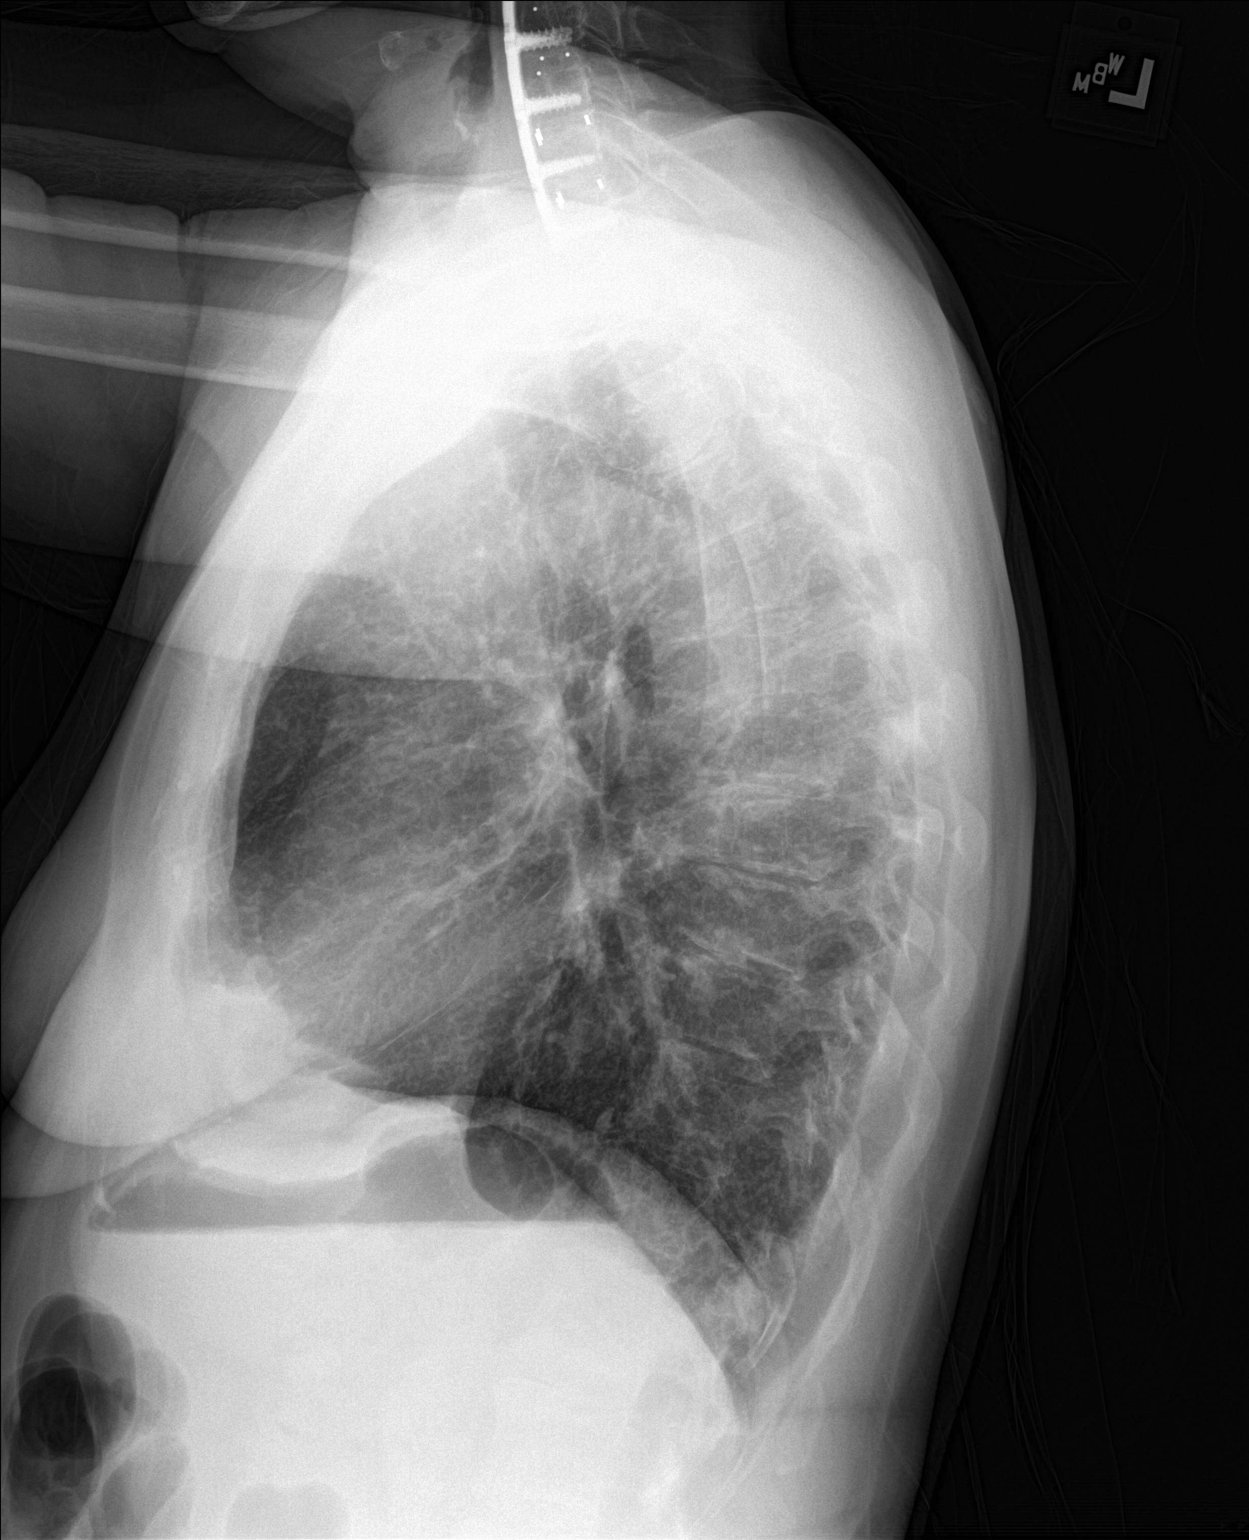

[2 of 2 positions shown; findings below may reference images not displayed]

FINDINGS: Mild, stable chronic appearing diffusely increased lung markings are
seen. There is no evidence of acute infiltrate, pleural effusion or
pneumothorax. The heart size and mediastinal contours are within
normal limits. A radiopaque fusion plate and screws are seen
overlying the cervical spine.
IMPRESSION: Chronic appearing increased lung markings without evidence of acute
or active cardiopulmonary disease.

## 2022-07-10 ENCOUNTER — Ambulatory Visit
Admission: RE | Admit: 2022-07-10 | Discharge: 2022-07-10 | Disposition: A | Discharge status: Home / Self Care | Payer: 59 | Source: Ambulatory Visit | Attending: Emergency Medicine | Admitting: Emergency Medicine

## 2022-07-10 VITALS — BP 107/76 | HR 83 | Temp 98.2°F | Ht 64.0 in | Wt 138.0 lb

## 2022-07-10 DIAGNOSIS — H66001 Acute suppurative otitis media without spontaneous rupture of ear drum, right ear: Secondary | ICD-10-CM

## 2022-07-10 DIAGNOSIS — J069 Acute upper respiratory infection, unspecified: Secondary | ICD-10-CM

## 2022-07-10 MED ORDER — PROMETHAZINE-DM 6.25-15 MG/5ML PO SYRP: 5.0000 mL | ORAL_SOLUTION | Freq: Four times a day (QID) | ORAL | 0 refills | Status: DC | PRN

## 2022-07-10 MED ORDER — CEFDINIR 300 MG PO CAPS: 300.0000 mg | ORAL_CAPSULE | Freq: Two times a day (BID) | ORAL | 0 refills | Status: AC

## 2022-07-10 MED ORDER — IPRATROPIUM BROMIDE 0.06 % NA SOLN: 2.0000 | Freq: Four times a day (QID) | NASAL | 12 refills | Status: DC

## 2022-07-10 MED ORDER — BENZONATATE 100 MG PO CAPS: 200.0000 mg | ORAL_CAPSULE | Freq: Three times a day (TID) | ORAL | 0 refills | Status: DC

## 2022-07-10 NOTE — ED Provider Notes (Signed)
[facility]    CSN: [id number] Arrival date & time: 07/10/22  1545      History   Chief Complaint Chief Complaint  Patient presents with   Ear Problem   Nausea    HPI Emily Johnston is a 61 y.o. female.   HPI  56-year-old female here for evaluation respiratory complaints.  Patient reports that for over a week she has been experiencing ear fullness, ringing in the ears, nausea, and decreased appetite.  She also endorses that she has had a mild headache, fatigue, and a nonproductive cough.  She denies any sore throat, shortness of breath, or wheezing.  Past Medical History:  Diagnosis Date   Anxiety    Arthritis    COPD (chronic obstructive pulmonary disease) (HCC)    Depression    Fibromyalgia    IBS (irritable bowel syndrome)    PTSD (post-traumatic stress disorder)     Patient Active Problem List   Diagnosis Date Noted   Vitamin D deficiency 04/27/2022   Elevated low density lipoprotein (LDL) cholesterol level 05/04/2021   Family history of breast cancer in mother 10/29/2020   Basal cell carcinoma (BCC) 07/10/2020   Spondylosis of cervical spine with myelopathy and radiculopathy 10/24/2019   Aortic atherosclerosis ([place]) 06/15/2019   Psoriatic arthritis ([place]) 03/22/2019   Centrilobular emphysema ([place]) 02/22/2019   Nicotine dependence, cigarettes, w unsp disorders 02/22/2019   IBS (irritable bowel syndrome) 11/01/2018   Severe recurrent major depression without psychotic features ([place]) 01/12/2018   PTSD (post-traumatic stress disorder) 01/12/2018   Fibromyalgia 01/12/2018    Past Surgical History:  Procedure Laterality Date   ABDOMINAL HYSTERECTOMY     CARPAL TUNNEL RELEASE     KNEE SURGERY      OB History   No obstetric history on file.      Home Medications    Prior to Admission medications   Medication Sig Start Date End Date Taking? Authorizing Provider  benzonatate (TESSALON) 100 MG capsule Take 2 capsules (200 mg total) by  mouth every 8 (eight) hours. 07/10/22  Yes Margarette Canada, NP  cefdinir (OMNICEF) 300 MG capsule Take 1 capsule (300 mg total) by mouth 2 (two) times daily for 10 days. 07/10/22 07/20/22 Yes Margarette Canada, NP  ipratropium (ATROVENT) 0.06 % nasal spray Place 2 sprays into both nostrils 4 (four) times daily. 07/10/22  Yes Margarette Canada, NP  promethazine-dextromethorphan (PROMETHAZINE-DM) 6.25-15 MG/5ML syrup Take 5 mLs by mouth 4 (four) times daily as needed. 07/10/22  Yes Margarette Canada, NP  acitretin (SORIATANE) 25 MG capsule Take 1 capsule by mouth daily. 04/30/20   [provider]  Adalimumab (HUMIRA PEN) 40 MG/0.4ML PNKT Inject into the skin. 02/10/20 05/12/22  [provider]  albuterol (VENTOLIN HFA) 108 (90 Base) MCG/ACT inhaler Inhale 2 puffs into the lungs every 6 (six) hours as needed for wheezing or shortness of breath. 09/04/20   Johnson, Megan P, DO  clobetasol ointment (TEMOVATE) 6.72 % Apply 1 application. topically 2 (two) times daily. 09/24/21   [provider]  clonazePAM (KLONOPIN) 0.5 MG tablet Take 1 tablet (0.5 mg total) by mouth 2 (two) times daily as needed for anxiety. 05/12/22   Cannady, Henrine Screws T, NP  DULoxetine (CYMBALTA) 60 MG capsule Take 2 capsules (120 mg total) by mouth daily. 05/12/22   Cannady, Henrine Screws T, NP  fluticasone (FLONASE) 50 MCG/ACT nasal spray Place 2 sprays into both nostrils daily. 06/02/22   Cannady, Henrine Screws T, NP  folic acid (FOLVITE) 1 MG tablet  Take 1 mg by mouth daily.    [provider]  gabapentin (NEURONTIN) 300 MG capsule Take 300 mg by mouth 3 (three) times daily.    [provider]  lactobacillus acidophilus & bulgar (LACTINEX) chewable tablet Chew 1 tablet by mouth 3 (three) times daily with meals. 12/10/21   Vigg, Avanti, MD  meclizine (ANTIVERT) 12.5 MG tablet Take 1 tablet (12.5 mg total) by mouth 3 (three) times daily as needed for dizziness. 09/04/20   Johnson, Megan P, DO  methotrexate 50 MG/2ML injection Inject  8 mLs into the skin once a week. 05/01/22   [provider]  montelukast (SINGULAIR) 10 MG tablet Take 1 tablet (10 mg total) by mouth at bedtime. 01/06/22   Marnee Guarneri T, NP  Multiple Vitamin (MULTIVITAMIN) tablet Take 1 tablet by mouth daily.    [provider]  mupirocin ointment (BACTROBAN) 2 % Apply 1 Application topically 2 (two) times daily. 06/13/22   Laurene Footman B, PA-C  STIOLTO RESPIMAT 2.5-2.5 MCG/ACT AERS INHALE 2 PUFFS BY MOUTH EVERY DAY 01/27/22   Marnee Guarneri T, NP    Family History Family History  Problem Relation Age of Onset   Breast cancer Mother 70    Social History Social History   Tobacco Use   Smoking status: Every Day    Packs/day: 1.00    Years: 41.00    Total pack years: 41.00    Types: Cigarettes   Smokeless tobacco: Never  Vaping Use   Vaping Use: Never used  Substance Use Topics   Alcohol use: No   Drug use: No     Allergies   Patient has no known allergies.   Review of Systems Review of Systems  Constitutional:  Positive for fatigue. Negative for fever.  HENT:  Positive for congestion, ear pain and tinnitus. Negative for rhinorrhea and sore throat.   Respiratory:  Positive for cough. Negative for shortness of breath and wheezing.   Gastrointestinal:  Positive for nausea.  Neurological:  Positive for headaches.  Hematological: Negative.   Psychiatric/Behavioral: Negative.       Physical Exam Triage Vital Signs ED Triage Vitals [07/10/22 1601]  Enc Vitals Group     BP      Pulse      Resp      Temp      Temp src      SpO2      Weight 138 lb (62.6 kg)     Height '5\' 4"'$  (1.626 m)     Head Circumference      Peak Flow      Pain Score 4     Pain Loc      Pain Edu?      Excl. in [place]?    No data found.  Updated Vital Signs BP 107/76 (BP Location: Left Arm)   Pulse 83   Temp 98.2 F (36.8 C) (Oral)   Ht '5\' 4"'$  (1.626 m)   Wt 138 lb (62.6 kg)   SpO2 98%   BMI 23.69 kg/m   Visual Acuity Right Eye  Distance:   Left Eye Distance:   Bilateral Distance:    Right Eye Near:   Left Eye Near:    Bilateral Near:     Physical Exam Vitals and nursing note reviewed.  Constitutional:      Appearance: Normal appearance. She is not ill-appearing.  HENT:     Head: Normocephalic and atraumatic.     Right Ear: Tympanic membrane, ear canal  and external ear normal. There is no impacted cerumen.     Left Ear: Ear canal and external ear normal. There is no impacted cerumen.     Ears:     Comments: There is pus present behind the left tympanic membrane with overlying erythema and injection.  The EAC is clear.  Right TM is pearly gray in appearance with normal light reflex and clear EAC.    Nose: Congestion and rhinorrhea present.     Comments: Nasal mucosa is erythematous and edematous with scant clear rhinorrhea in both nares.    Mouth/Throat:     Mouth: Mucous membranes are moist.     Pharynx: Oropharynx is clear. No oropharyngeal exudate or posterior oropharyngeal erythema.  Cardiovascular:     Rate and Rhythm: Normal rate and regular rhythm.     Pulses: Normal pulses.     Heart sounds: Normal heart sounds. No murmur heard.    No friction rub. No gallop.  Pulmonary:     Effort: Pulmonary effort is normal.     Breath sounds: Normal breath sounds. No wheezing, rhonchi or rales.  Musculoskeletal:     Cervical back: Normal range of motion and neck supple.  Lymphadenopathy:     Cervical: No cervical adenopathy.  Skin:    General: Skin is warm and dry.     Capillary Refill: Capillary refill takes less than 2 seconds.     Findings: No erythema or rash.  Neurological:     General: No focal deficit present.     Mental Status: She is alert and oriented to person, place, and time.  Psychiatric:        Mood and Affect: Mood normal.        Behavior: Behavior normal.        Thought Content: Thought content normal.        Judgment: Judgment normal.      UC Treatments / Results  Labs (all  labs ordered are listed, but only abnormal results are displayed) Labs Reviewed - No data to display  EKG   Radiology No results found.  Procedures Procedures (including critical care time)  Medications Ordered in UC Medications - No data to display  Initial Impression / Assessment and Plan / UC Course  I have reviewed the triage vital signs and the nursing notes.  Pertinent labs & imaging results that were available during my care of the patient were reviewed by me and considered in my medical decision making (see chart for details).   Patient is a nontoxic-appearing 61 year old female here for evaluation of respiratory complaints as outlined in HPI above.  Her exam does reveal the presence of an otitis media on the left.  She states she took a 7-day course of amoxicillin without any improvement of her symptoms.  There is pus behind the tympanic membrane on the left.  The right tympanic membrane is pearly gray in appearance with normal light reflex.  Both EACs are clear.  She does have other signs of an upper respiratory infection with edema of her nasal mucosa and scant clear rhinorrhea.  Oropharyngeal exam is benign.  Cardiopulmonary exam bisque lung sounds in all fields.  I will treat patient for upper respiratory infection and otitis media with cefdinir 300 mg twice daily for 10 days.  Also Atrovent nasal spray to help with her congestion that she can use 4 times daily.  Tessalon Perles and Promethazine DM cough syrup help with cough and congestion.  This should also help her  with her nausea.  Return precautions reviewed.   Final Clinical Impressions(s) / UC Diagnoses   Final diagnoses:  Non-recurrent acute suppurative otitis media of right ear without spontaneous rupture of tympanic membrane  Acute upper respiratory infection     Discharge Instructions      Take the Cefdinir twice daily for 10 days with food for treatment of your ear infection.  Take an over-the-counter  probiotic 1 hour after each dose of antibiotic to prevent diarrhea.  Use over-the-counter Tylenol and ibuprofen as needed for pain or fever.  Place a hot water bottle, or heating pad, underneath your pillowcase at night to help dilate up your ear and aid in pain relief as well as resolution of the infection.  Use the Atrovent nasal spray, 2 squirts in each nostril every 6 hours, as needed for runny nose and postnasal drip.  Use the Tessalon Perles every 8 hours during the day.  Take them with a small sip of water.  They may give you some numbness to the base of your tongue or a metallic taste in your mouth, this is normal.  Use the Promethazine DM cough syrup at bedtime for cough and congestion.  It will make you drowsy so do not take it during the day.  Return for reevaluation for any new or worsening symptoms.      ED Prescriptions     Medication Sig Dispense Auth. Provider   cefdinir (OMNICEF) 300 MG capsule Take 1 capsule (300 mg total) by mouth 2 (two) times daily for 10 days. 20 capsule Margarette Canada, NP   benzonatate (TESSALON) 100 MG capsule Take 2 capsules (200 mg total) by mouth every 8 (eight) hours. 21 capsule Margarette Canada, NP   ipratropium (ATROVENT) 0.06 % nasal spray Place 2 sprays into both nostrils 4 (four) times daily. 15 mL Margarette Canada, NP   promethazine-dextromethorphan (PROMETHAZINE-DM) 6.25-15 MG/5ML syrup Take 5 mLs by mouth 4 (four) times daily as needed. 118 mL Margarette Canada, NP      PDMP not reviewed this encounter.   Margarette Canada, NP 07/10/22 1635

## 2022-07-10 NOTE — ED Triage Notes (Signed)
Pt c/o fluid in ears, nauseated, loss of appetite onset over a week ago. Pt states she had a rx for amoxicillin and took it for x7 days

## 2022-07-10 NOTE — Discharge Instructions (Signed)
Take the Cefdinir twice daily for 10 days with food for treatment of your ear infection.  Take an over-the-counter probiotic 1 hour after each dose of antibiotic to prevent diarrhea.  Use over-the-counter Tylenol and ibuprofen as needed for pain or fever.  Place a hot water bottle, or heating pad, underneath your pillowcase at night to help dilate up your ear and aid in pain relief as well as resolution of the infection.  Use the Atrovent nasal spray, 2 squirts in each nostril every 6 hours, as needed for runny nose and postnasal drip.  Use the Tessalon Perles every 8 hours during the day.  Take them with a small sip of water.  They may give you some numbness to the base of your tongue or a metallic taste in your mouth, this is normal.  Use the Promethazine DM cough syrup at bedtime for cough and congestion.  It will make you drowsy so do not take it during the day.  Return for reevaluation for any new or worsening symptoms.

## 2022-07-24 ENCOUNTER — Ambulatory Visit
Admission: RE | Admit: 2022-07-24 | Discharge: 2022-07-24 | Disposition: A | Discharge status: Home / Self Care | Payer: 59 | Source: Ambulatory Visit | Attending: Family Medicine | Admitting: Family Medicine

## 2022-07-24 VITALS — BP 149/99 | HR 78 | Temp 98.4°F | Ht 64.0 in | Wt 138.0 lb

## 2022-07-24 DIAGNOSIS — H6993 Unspecified Eustachian tube disorder, bilateral: Secondary | ICD-10-CM | POA: Diagnosis not present

## 2022-07-24 DIAGNOSIS — J441 Chronic obstructive pulmonary disease with (acute) exacerbation: Secondary | ICD-10-CM | POA: Diagnosis not present

## 2022-07-24 MED ORDER — AZITHROMYCIN 250 MG PO TABS: 250.0000 mg | ORAL_TABLET | Freq: Once | ORAL | 0 refills | Status: AC

## 2022-07-24 MED ORDER — PREDNISONE 10 MG (21) PO TBPK: ORAL_TABLET | Freq: Every day | ORAL | 0 refills | Status: DC

## 2022-07-24 NOTE — ED Triage Notes (Signed)
Pt states she was recently here for URI, still having congestion, pt states she is a little better but not cleared yet

## 2022-07-24 NOTE — ED Provider Notes (Signed)
[facility]    CSN: [id number] Arrival date & time: 07/24/22  1137      History   Chief Complaint Chief Complaint  Patient presents with   Otalgia    Bilateral    Nasal Congestion   Cough    HPI Emily Johnston is a 61 y.o. female.   HPI   Emily Johnston presents for ongoing cough, nasal congestion and ear pressure. She was seen for URI and given antibiotics that she completed. She has been feeling more tired. Feels weak and has been laying around a lot. Feels like her ears are full of fluid. Endorses headache, some nausea, chest tightness, productive cough. Has COPD. Denies sore throat, vomiting, diarrhea, rash.        Past Medical History:  Diagnosis Date   Anxiety    Arthritis    COPD (chronic obstructive pulmonary disease) (HCC)    Depression    Fibromyalgia    IBS (irritable bowel syndrome)    PTSD (post-traumatic stress disorder)     Patient Active Problem List   Diagnosis Date Noted   Vitamin D deficiency 04/27/2022   Elevated low density lipoprotein (LDL) cholesterol level 05/04/2021   Family history of breast cancer in mother 10/29/2020   Basal cell carcinoma (BCC) 07/10/2020   Spondylosis of cervical spine with myelopathy and radiculopathy 10/24/2019   Aortic atherosclerosis ([place]) 06/15/2019   Psoriatic arthritis ([place]) 03/22/2019   Centrilobular emphysema ([place]) 02/22/2019   Nicotine dependence, cigarettes, w unsp disorders 02/22/2019   IBS (irritable bowel syndrome) 11/01/2018   Severe recurrent major depression without psychotic features ([place]) 01/12/2018   PTSD (post-traumatic stress disorder) 01/12/2018   Fibromyalgia 01/12/2018    Past Surgical History:  Procedure Laterality Date   ABDOMINAL HYSTERECTOMY     CARPAL TUNNEL RELEASE     KNEE SURGERY      OB History   No obstetric history on file.      Home Medications    Prior to Admission medications   Medication Sig Start Date End Date Taking? Authorizing Provider   acitretin (SORIATANE) 25 MG capsule Take 1 capsule by mouth daily. 04/30/20  Yes [provider]  Adalimumab (HUMIRA PEN) 40 MG/0.4ML PNKT Inject into the skin. 02/10/20 07/24/22 Yes [provider]  albuterol (VENTOLIN HFA) 108 (90 Base) MCG/ACT inhaler Inhale 2 puffs into the lungs every 6 (six) hours as needed for wheezing or shortness of breath. 09/04/20  Yes Johnson, Megan P, DO  azithromycin (ZITHROMAX Z-PAK) 250 MG tablet Take 1 tablet (250 mg total) by mouth once for 1 dose. Take 2 tablet on the first day. 07/24/22 07/24/22 Yes Makaiyah Schweiger, DO  benzonatate (TESSALON) 100 MG capsule Take 2 capsules (200 mg total) by mouth every 8 (eight) hours. 07/10/22  Yes Margarette Canada, NP  clobetasol ointment (TEMOVATE) 6.76 % Apply 1 application. topically 2 (two) times daily. 09/24/21  Yes [provider]  clonazePAM (KLONOPIN) 0.5 MG tablet Take 1 tablet (0.5 mg total) by mouth 2 (two) times daily as needed for anxiety. 05/12/22  Yes Cannady, [name] T, NP  DULoxetine (CYMBALTA) 60 MG capsule Take 2 capsules (120 mg total) by mouth daily. 05/12/22  Yes Cannady, [name] T, NP  fluticasone (FLONASE) 50 MCG/ACT nasal spray Place 2 sprays into both nostrils daily. 06/02/22  Yes Cannady, [name] T, NP  folic acid (FOLVITE) 1 MG tablet Take 1 mg by mouth daily.   Yes [provider]  gabapentin (NEURONTIN) 300 MG capsule Take 300 mg by  mouth 3 (three) times daily.   Yes [provider]  ipratropium (ATROVENT) 0.06 % nasal spray Place 2 sprays into both nostrils 4 (four) times daily. 07/10/22  Yes Margarette Canada, NP  lactobacillus acidophilus & bulgar (LACTINEX) chewable tablet Chew 1 tablet by mouth 3 (three) times daily with meals. 12/10/21  Yes Vigg, Avanti, MD  meclizine (ANTIVERT) 12.5 MG tablet Take 1 tablet (12.5 mg total) by mouth 3 (three) times daily as needed for dizziness. 09/04/20  Yes Johnson, Megan P, DO  methotrexate 50 MG/2ML injection Inject 8 mLs into the  skin once a week. 05/01/22  Yes [provider]  montelukast (SINGULAIR) 10 MG tablet Take 1 tablet (10 mg total) by mouth at bedtime. 01/06/22  Yes Cannady, Henrine Screws T, NP  Multiple Vitamin (MULTIVITAMIN) tablet Take 1 tablet by mouth daily.   Yes [provider]  mupirocin ointment (BACTROBAN) 2 % Apply 1 Application topically 2 (two) times daily. 06/13/22  Yes Danton Clap, PA-C  predniSONE (STERAPRED UNI-PAK 21 TAB) 10 MG (21) TBPK tablet Take by mouth daily. Take 6 tabs by mouth daily  for 2 days, then 5 tabs for 2 days, then 4 tabs for 2 days, then 3 tabs for 2 days, 2 tabs for 2 days, then 1 tab by mouth daily for 2 days 07/24/22  Yes Yameli Delamater, DO  promethazine-dextromethorphan (PROMETHAZINE-DM) 6.25-15 MG/5ML syrup Take 5 mLs by mouth 4 (four) times daily as needed. 07/10/22  Yes Margarette Canada, NP  STIOLTO RESPIMAT 2.5-2.5 MCG/ACT AERS INHALE 2 PUFFS BY MOUTH EVERY DAY 01/27/22  Yes Cannady, Barbaraann Faster, NP    Family History Family History  Problem Relation Age of Onset   Breast cancer Mother 68    Social History Social History   Tobacco Use   Smoking status: Every Day    Packs/day: 1.00    Years: 41.00    Total pack years: 41.00    Types: Cigarettes   Smokeless tobacco: Never  Vaping Use   Vaping Use: Never used  Substance Use Topics   Alcohol use: No   Drug use: No     Allergies   Patient has no known allergies.   Review of Systems Review of Systems: negative unless otherwise stated in HPI.      Physical Exam Triage Vital Signs ED Triage Vitals  Enc Vitals Group     BP 07/24/22 1227 (!) 149/99     Pulse Rate 07/24/22 1227 78     Resp --      Temp 07/24/22 1227 98.4 F (36.9 C)     Temp Source 07/24/22 1227 Oral     SpO2 07/24/22 1227 95 %     Weight 07/24/22 1226 138 lb (62.6 kg)     Height 07/24/22 1226 '5\' 4"'$  (1.626 m)     Head Circumference --      Peak Flow --      Pain Score 07/24/22 1225 0     Pain Loc --      Pain Edu? --       Excl. in [place]? --    No data found.  Updated Vital Signs BP (!) 149/99 (BP Location: Left Arm)   Pulse 78   Temp 98.4 F (36.9 C) (Oral)   Ht '5\' 4"'$  (1.626 m)   Wt 62.6 kg   SpO2 95%   BMI 23.69 kg/m   Visual Acuity Right Eye Distance:   Left Eye Distance:   Bilateral Distance:  Right Eye Near:   Left Eye Near:    Bilateral Near:     Physical Exam GEN:     alert, chronically ill-appearing female in no distress     HENT:  mucus membranes tachy, oropharyngeal  without lesions or exudate, no tonsillar hypertrophy, no oropharyngeal erythema , no nasal discharge, bilateral effusions, no TM erythema or bulging  EYES:   pupils equal and reactive, EOMi ,  no scleral injection NECK:  normal ROM, no  lymphadenopathy,  no meningismus   RESP:  no increased work of breathing, wheezing and rhonchi CVS:   regular rate  and rhythm Skin:   warm and dry, no rash on visible skin , normal  skin turgor    UC Treatments / Results  Labs (all labs ordered are listed, but only abnormal results are displayed) Labs Reviewed - No data to display  EKG   Radiology No results found.  Procedures Procedures (including critical care time)  Medications Ordered in UC Medications - No data to display  Initial Impression / Assessment and Plan / UC Course  I have reviewed the triage vital signs and the nursing notes.  Pertinent labs & imaging results that were available during my care of the patient were reviewed by me and considered in my medical decision making (see chart for details).       Pt is a 61 y.o. female with history of COPD who presents for productive cough with chest tightness and bilateral ear pressure for past 2 weeks.   Fatema is  afebrile here without recent antipyretics. Satting well on room air. Pulmonary exam remarkable for rhonchi and wheezing. COVID  and influenza testing deferred due to duration of symptoms.  Treat for COPD exacerbation with antibiotics and  steroids as below.  She likely has eustachian tube dysfunction which can be alleviated with steroids. No signs of ear infection.  Return and ED precautions given and patient voiced understanding. Discussed MDM, treatment plan and plan for follow-up with patient who agrees with plan.     Final Clinical Impressions(s) / UC Diagnoses   Final diagnoses:  COPD exacerbation ([place])  Eustachian tube dysfunction, bilateral     Discharge Instructions      Let's treat you with steroids and antibiotics to help further clear up your symptoms.  Stop by the pharmacy to pick up your prescriptions       ED Prescriptions     Medication Sig Dispense Auth. Provider   predniSONE (STERAPRED UNI-PAK 21 TAB) 10 MG (21) TBPK tablet Take by mouth daily. Take 6 tabs by mouth daily  for 2 days, then 5 tabs for 2 days, then 4 tabs for 2 days, then 3 tabs for 2 days, 2 tabs for 2 days, then 1 tab by mouth daily for 2 days 42 tablet Emily Boyson, DO   azithromycin (ZITHROMAX Z-PAK) 250 MG tablet Take 1 tablet (250 mg total) by mouth once for 1 dose. Take 2 tablet on the first day. 6 tablet Emily Hensen, DO      PDMP not reviewed this encounter.   Emily Hensen, DO 07/24/22 1407

## 2022-07-24 NOTE — Discharge Instructions (Addendum)
Let's treat you with steroids and antibiotics to help further clear up your symptoms.  Stop by the pharmacy to pick up your prescriptions

## 2022-08-02 ENCOUNTER — Ambulatory Visit
Admission: EM | Admit: 2022-08-02 | Discharge: 2022-08-02 | Disposition: A | Discharge status: Home / Self Care | Payer: 59 | Attending: Physician Assistant | Admitting: Physician Assistant

## 2022-08-02 VITALS — BP 146/96 | HR 77 | Temp 98.2°F | Resp 14 | Ht 64.0 in | Wt 134.0 lb

## 2022-08-02 DIAGNOSIS — H6993 Unspecified Eustachian tube disorder, bilateral: Secondary | ICD-10-CM | POA: Diagnosis not present

## 2022-08-02 DIAGNOSIS — H6503 Acute serous otitis media, bilateral: Secondary | ICD-10-CM | POA: Diagnosis not present

## 2022-08-02 DIAGNOSIS — H9203 Otalgia, bilateral: Secondary | ICD-10-CM | POA: Diagnosis not present

## 2022-08-02 MED ORDER — FEXOFENADINE-PSEUDOEPHED ER 180-240 MG PO TB24: 1.0000 | ORAL_TABLET | Freq: Every day | ORAL | 0 refills | Status: AC

## 2022-08-02 MED ORDER — FLUTICASONE PROPIONATE 50 MCG/ACT NA SUSP: 2.0000 | Freq: Every day | NASAL | 0 refills | Status: DC

## 2022-08-02 NOTE — Discharge Instructions (Signed)
-  You are having the ear pain because of fluid behind your eardrums.  There is no infection.  You have already been treated with 2 different antibiotics and steroids and none of that has helped.  I am changing you to a different decongestant and nasal spray.  As we discussed, you may continue to have this pain and discomfort until the fluid dries up which can take several weeks.  I wanted to place a referral to ENT for you but unfortunately I can only refer to [facility] through the system and you do not want to go that far, understandably.  Please reach out to your PCP and see if they can place a referral for you.

## 2022-08-02 NOTE — ED Triage Notes (Signed)
Patient was seen here 9 days ago for URI symptoms and congestion.  Patient reports bilateral ear pain and pressure that has not improved with her antibiotics for ear infection.

## 2022-08-02 NOTE — ED Provider Notes (Signed)
[facility]    CSN: [id number] Arrival date & time: 08/02/22  1049      History   Chief Complaint Chief Complaint  Patient presents with   Otalgia    Appointment    HPI Emily Johnston is a 61 y.o. female returning for continued congestion and bilateral ear pain/pressure and fullness.  She reports she has been having the symptoms for the past 1 month and this is her third visit to this urgent care for the symptoms.  She was initially seen on 07/10/2022 and diagnosed with acute sinusitis.  She was given cefdinir, Atrovent nasal spray, Promethazine DM and benzonatate.  She was seen as a time on 07/24/2022 and treated for COPD exacerbation with 10-day course of prednisone as well as azithromycin.  She states that her cough is improved but she is concerned about her continued ear discomfort.  She says the ear pain makes it hard for her to sleep at night.  Reports feeling dizzy at times.  She says she has been taking Mucinex and trying the Atrovent nasal spray.  She denies any fevers or weakness, sinus pain, chest pain or shortness of breath.  She says she has never had a problem like this before.  No other complaints.   HPI  Past Medical History:  Diagnosis Date   Anxiety    Arthritis    COPD (chronic obstructive pulmonary disease) (HCC)    Depression    Fibromyalgia    IBS (irritable bowel syndrome)    PTSD (post-traumatic stress disorder)     Patient Active Problem List   Diagnosis Date Noted   Vitamin D deficiency 04/27/2022   Elevated low density lipoprotein (LDL) cholesterol level 05/04/2021   Family history of breast cancer in mother 10/29/2020   Basal cell carcinoma (BCC) 07/10/2020   Spondylosis of cervical spine with myelopathy and radiculopathy 10/24/2019   Aortic atherosclerosis ([place]) 06/15/2019   Psoriatic arthritis ([place]) 03/22/2019   Centrilobular emphysema ([place]) 02/22/2019   Nicotine dependence, cigarettes, w unsp disorders 02/22/2019   IBS  (irritable bowel syndrome) 11/01/2018   Severe recurrent major depression without psychotic features ([place]) 01/12/2018   PTSD (post-traumatic stress disorder) 01/12/2018   Fibromyalgia 01/12/2018    Past Surgical History:  Procedure Laterality Date   ABDOMINAL HYSTERECTOMY     CARPAL TUNNEL RELEASE     KNEE SURGERY      OB History   No obstetric history on file.      Home Medications    Prior to Admission medications   Medication Sig Start Date End Date Taking? Authorizing Provider  fexofenadine-pseudoephedrine (ALLEGRA-D 24) 180-240 MG 24 hr tablet Take 1 tablet by mouth daily for 15 days. 08/02/22 08/17/22 Yes Laurene Footman B, PA-C  fluticasone (FLONASE) 50 MCG/ACT nasal spray Place 2 sprays into both nostrils daily. 08/02/22  Yes Danton Clap, PA-C  acitretin (SORIATANE) 25 MG capsule Take 1 capsule by mouth daily. 04/30/20   [provider]  Adalimumab (HUMIRA PEN) 40 MG/0.4ML PNKT Inject into the skin. 02/10/20 07/24/22  [provider]  albuterol (VENTOLIN HFA) 108 (90 Base) MCG/ACT inhaler Inhale 2 puffs into the lungs every 6 (six) hours as needed for wheezing or shortness of breath. 09/04/20   Johnson, Megan P, DO  benzonatate (TESSALON) 100 MG capsule Take 2 capsules (200 mg total) by mouth every 8 (eight) hours. 07/10/22   Margarette Canada, NP  clobetasol ointment (TEMOVATE) 7.90 % Apply 1 application. topically 2 (two) times daily. 09/24/21  [provider]  clonazePAM (KLONOPIN) 0.5 MG tablet Take 1 tablet (0.5 mg total) by mouth 2 (two) times daily as needed for anxiety. 05/12/22   Cannady, Henrine Screws T, NP  DULoxetine (CYMBALTA) 60 MG capsule Take 2 capsules (120 mg total) by mouth daily. 05/12/22   Cannady, Henrine Screws T, NP  folic acid (FOLVITE) 1 MG tablet Take 1 mg by mouth daily.    [provider]  gabapentin (NEURONTIN) 300 MG capsule Take 300 mg by mouth 3 (three) times daily.    [provider]  ipratropium (ATROVENT) 0.06 % nasal  spray Place 2 sprays into both nostrils 4 (four) times daily. 07/10/22   Margarette Canada, NP  lactobacillus acidophilus & bulgar (LACTINEX) chewable tablet Chew 1 tablet by mouth 3 (three) times daily with meals. 12/10/21   Vigg, Avanti, MD  meclizine (ANTIVERT) 12.5 MG tablet Take 1 tablet (12.5 mg total) by mouth 3 (three) times daily as needed for dizziness. 09/04/20   Johnson, Megan P, DO  methotrexate 50 MG/2ML injection Inject 8 mLs into the skin once a week. 05/01/22   [provider]  montelukast (SINGULAIR) 10 MG tablet Take 1 tablet (10 mg total) by mouth at bedtime. 01/06/22   Marnee Guarneri T, NP  Multiple Vitamin (MULTIVITAMIN) tablet Take 1 tablet by mouth daily.    [provider]  mupirocin ointment (BACTROBAN) 2 % Apply 1 Application topically 2 (two) times daily. 06/13/22   Danton Clap, PA-C  predniSONE (STERAPRED UNI-PAK 21 TAB) 10 MG (21) TBPK tablet Take by mouth daily. Take 6 tabs by mouth daily  for 2 days, then 5 tabs for 2 days, then 4 tabs for 2 days, then 3 tabs for 2 days, 2 tabs for 2 days, then 1 tab by mouth daily for 2 days 07/24/22   Lyndee Hensen, DO  promethazine-dextromethorphan (PROMETHAZINE-DM) 6.25-15 MG/5ML syrup Take 5 mLs by mouth 4 (four) times daily as needed. 07/10/22   Margarette Canada, NP  STIOLTO RESPIMAT 2.5-2.5 MCG/ACT AERS INHALE 2 PUFFS BY MOUTH EVERY DAY 01/27/22   Marnee Guarneri T, NP    Family History Family History  Problem Relation Age of Onset   Breast cancer Mother 47    Social History Social History   Tobacco Use   Smoking status: Every Day    Packs/day: 1.00    Years: 41.00    Total pack years: 41.00    Types: Cigarettes   Smokeless tobacco: Never  Vaping Use   Vaping Use: Never used  Substance Use Topics   Alcohol use: No   Drug use: No     Allergies   Patient has no known allergies.   Review of Systems Review of Systems  Constitutional:  Negative for chills, diaphoresis, fatigue and fever.  HENT:   Positive for congestion and ear pain. Negative for ear discharge, hearing loss, rhinorrhea, sinus pressure, sinus pain and sore throat.   Respiratory:  Positive for cough. Negative for shortness of breath.   Cardiovascular:  Negative for chest pain.  Gastrointestinal:  Negative for abdominal pain, nausea and vomiting.  Musculoskeletal:  Negative for arthralgias and myalgias.  Skin:  Negative for rash.  Neurological:  Positive for dizziness. Negative for weakness and headaches.  Hematological:  Negative for adenopathy.  Psychiatric/Behavioral:  Positive for sleep disturbance.      Physical Exam Triage Vital Signs ED Triage Vitals  Enc Vitals Group     BP 08/02/22 1116 (!) 146/96     Pulse Rate 08/02/22  1116 77     Resp 08/02/22 1116 14     Temp 08/02/22 1116 98.2 F (36.8 C)     Temp Source 08/02/22 1116 Oral     SpO2 08/02/22 1116 95 %     Weight 08/02/22 1114 134 lb (60.8 kg)     Height 08/02/22 1114 '5\' 4"'$  (1.626 m)     Head Circumference --      Peak Flow --      Pain Score 08/02/22 1114 5     Pain Loc --      Pain Edu? --      Excl. in [place]? --    No data found.  Updated Vital Signs BP (!) 146/96 (BP Location: Left Arm)   Pulse 77   Temp 98.2 F (36.8 C) (Oral)   Resp 14   Ht '5\' 4"'$  (1.626 m)   Wt 134 lb (60.8 kg)   SpO2 95%   BMI 23.00 kg/m       Physical Exam Vitals and nursing note reviewed.  Constitutional:      General: She is not in acute distress.    Appearance: Normal appearance. She is not ill-appearing or toxic-appearing.  HENT:     Head: Normocephalic and atraumatic.     Right Ear: Hearing, ear canal and external ear normal. A middle ear effusion (cloudy) is present. Tympanic membrane is bulging.     Left Ear: Hearing, ear canal and external ear normal. A middle ear effusion (cloudy) is present.     Nose: Congestion present.     Mouth/Throat:     Mouth: Mucous membranes are dry.     Pharynx: Oropharynx is clear.     Comments: Clear PND Eyes:      General: No scleral icterus.       Right eye: No discharge.        Left eye: No discharge.     Conjunctiva/sclera: Conjunctivae normal.  Cardiovascular:     Rate and Rhythm: Normal rate and regular rhythm.     Heart sounds: Normal heart sounds.  Pulmonary:     Effort: Pulmonary effort is normal. No respiratory distress.     Breath sounds: Normal breath sounds.  Musculoskeletal:     Cervical back: Neck supple.  Skin:    General: Skin is dry.  Neurological:     General: No focal deficit present.     Mental Status: She is alert. Mental status is at baseline.     Motor: No weakness.     Gait: Gait normal.  Psychiatric:        Mood and Affect: Mood normal.        Behavior: Behavior normal.        Thought Content: Thought content normal.      UC Treatments / Results  Labs (all labs ordered are listed, but only abnormal results are displayed) Labs Reviewed - No data to display  EKG   Radiology No results found.  Procedures Procedures (including critical care time)  Medications Ordered in UC Medications - No data to display  Initial Impression / Assessment and Plan / UC Course  I have reviewed the triage vital signs and the nursing notes.  Pertinent labs & imaging results that were available during my care of the patient were reviewed by me and considered in my medical decision making (see chart for details).   61 year old female presenting for 1 month history of bilateral ear pain/pressure and fullness.  Also reports congestion.  She has been treated 2 times already in this department.  Once for acute sinusitis and the second time for COPD exacerbation.  She has had cefdinir, azithromycin, prednisone for 10 days, Atrovent nasal spray, Promethazine DM and benzonatate.  She reports resolution of all of her symptoms except for the ear complaint.  States that discomfort has improved only slightly.  BP is elevated at 146/96.  Other vitals normal and stable.  Patient overall  well-appearing.  On exam she has nasal congestion.  She also has cloudy effusions of bilateral TMs with bulging of the right TM.  Canal with postnasal drainage.  Chest clear to auscultation heart regular rate and rhythm.   Discussed with patient that her ear symptoms are due to fluid behind her TMs.  The fluid is cloudy.  The eardrum is not red and it does not appear to be due to infection.  She is already had 2 different antibiotics and a long course of steroids.  Advised switching her to a different decongestant.  Sent in [place] and will also try her on Flonase.  Advised her that I could refer her to ENT in [place] but she declined stating it is too far away.  Advised her to reach out to her PCP to see if they can place a referral for her but I did discuss with her that it can take several weeks for the symptoms to resolve.  Reviewed return precautions.   Final Clinical Impressions(s) / UC Diagnoses   Final diagnoses:  Dysfunction of both eustachian tubes  Otalgia of both ears  Bilateral acute serous otitis media, recurrence not specified     Discharge Instructions      -You are having the ear pain because of fluid behind your eardrums.  There is no infection.  You have already been treated with 2 different antibiotics and steroids and none of that has helped.  I am changing you to a different decongestant and nasal spray.  As we discussed, you may continue to have this pain and discomfort until the fluid dries up which can take several weeks.  I wanted to place a referral to ENT for you but unfortunately I can only refer to [facility] through the system and you do not want to go that far, understandably.  Please reach out to your PCP and see if they can place a referral for you.     ED Prescriptions     Medication Sig Dispense Auth. Provider   fexofenadine-pseudoephedrine (ALLEGRA-D 24) 180-240 MG 24 hr tablet Take 1 tablet by mouth daily for 15 days. 15 tablet Laurene Footman B, PA-C    fluticasone (FLONASE) 50 MCG/ACT nasal spray Place 2 sprays into both nostrils daily. 1 g Danton Clap, PA-C      PDMP not reviewed this encounter.   Danton Clap, PA-C 08/02/22 1215

## 2022-08-15 ENCOUNTER — Ambulatory Visit: Admit: 2022-08-15 | Discharge: 2022-08-15 | Disposition: A | Discharge status: Home / Self Care | Payer: MEDICARE

## 2022-08-15 ENCOUNTER — Emergency Department: Admit: 2022-08-15 | Discharge: 2022-08-15 | Disposition: A | Discharge status: Home / Self Care | Payer: MEDICARE

## 2022-08-15 DIAGNOSIS — H6593 Unspecified nonsuppurative otitis media, bilateral: Principal | ICD-10-CM

## 2022-08-15 DIAGNOSIS — B338 Other specified viral diseases: Principal | ICD-10-CM

## 2022-08-15 MED ORDER — BROMPHENIRAMINE-PSEUDOEPHEDRINE-DM 2 MG-30 MG-10 MG/5 ML ORAL SYRUP
Freq: Four times a day (QID) | ORAL | 0 refills | 6 days | Status: CP | PRN
Start: 2022-08-15 — End: ?

## 2022-08-15 MED ORDER — MONTELUKAST 10 MG TABLET
ORAL_TABLET | Freq: Every evening | ORAL | 1 refills | 30 days | Status: CP
Start: 2022-08-15 — End: 2022-09-04

## 2022-08-15 MED ORDER — ALBUTEROL SULFATE HFA 90 MCG/ACTUATION AEROSOL INHALER
RESPIRATORY_TRACT | 1 refills | 0 days | Status: CP | PRN
Start: 2022-08-15 — End: 2023-08-15

## 2022-08-15 MED ORDER — ALBUTEROL SULFATE 2.5 MG/3 ML (0.083 %) SOLUTION FOR NEBULIZATION
Freq: Four times a day (QID) | RESPIRATORY_TRACT | 1 refills | 1 days | Status: CP | PRN
Start: 2022-08-15 — End: 2023-08-15

## 2022-08-15 MED ORDER — LIDOCAINE HCL 2 % MUCOSAL SOLUTION
OROMUCOSAL | 0 refills | 2 days | Status: CP | PRN
Start: 2022-08-15 — End: ?

## 2022-08-15 NOTE — Unmapped (Signed)
Pt reports having a cough and wheezing for over 1 month. Also reports having a sore throat. Also sts her ears feel like they are full of fluid. Sts she have had 3 rounds of abx since the symptoms began.

## 2022-08-15 NOTE — Unmapped (Signed)
Michele Miller  Emergency Department Provider Note        ED Clinical Impression      Final diagnoses:   RSV infection (Primary)   Bilateral otitis media with effusion           Impression, ED Course, Assessment and Plan      Impression: Michele Miller is a 61 y.o. female with PMH significant for COPD, centrilobular emphysema, HLD, aortic atherosclerosis, and tobacco use presenting to the ED for evaluation of one month of continued nonproductive cough, fever (Tmax 99.14F), ear pain, and sore throat which acutely worsened this week.  VSS in triage, nontoxic in appearance.  On exam, patient is speaking in full sentences without any difficulty.  Lung sounds clear.  She has serous effusion posterior bilateral TMs with mild rhinorrhea.  Beefy red appearing oropharynx with cobblestoning.  Benign exam otherwise.    Ddx includes new viral URI vs strep vs PNA vs bronchitis or poorly controlled RAD.  No concern for cardiac etiology.  Plan to obtain 4plex, strep and CXR.    3:28 PM  CXR clear.  Strep negative.  RSV test positive.  Have discussed this result with patient.  Recommended viral precautions, supportive measures.  Have added in Singulair to her ongoing prescribed medications, viscous lidocaine for throat discomfort, Bromfed as needed for cough and congestion.  Nighttime humidifier, hydration, rest.  She is requesting referral to ENT for ongoing sinus congestion and effusion to bilateral ears hence I did place this referral per her request.  Contact information verified.  I also offered to place a referral to pulmonology for ongoing management of her COPD and better control of her symptoms however she politely declined.         Additional Medical Decision Making     I have reviewed the vital signs and the nursing notes. Labs and radiology results that were available during my care of the patient were independently reviewed by me and considered in my medical decision making.     I reviewed the patient's prior medical records: UC note on 10/26 shows patient was treated with cefdinir (10 day), atrovent nasal spray, tessalon perles, and promethazine DM. On 11/09 she got an extended steroid taper and a z pack. 11/18 she received Flonase and Allegra-D. Fam med office visit on 05/12/22 where patient was continuing to smoke and used Stiolto daily and albuterol as needed for her COPD and emphysema     I independently visualized the radiology images.       Portions of this record have been created using Scientist, clinical (histocompatibility and immunogenetics). Dictation errors have been sought, but may not have been identified and corrected.  ____________________________________________         History        Chief Complaint  Cough      HPI   Michele Miller is a 61 y.o. female with PMH significant for COPD (centrilobular emphysema), HLD, aortic atherosclerosis, and tobacco use presenting to the ED for evaluation of cough. The patient reports one month of continued nonproductive cough, fever (Tmax 99.14F), ear pain, and sore throat which acutely worsened this week. States she did not feel like she had the flu up until the past few days when she developed a severe sore throat and cough worsened.  Intermittently wheezing. She has had three rounds of antibiotics over the last month for ongoing cough and sinus congestion and has been taking Flonase and mucinex with no relief. She also reports she  used an albuterol nebulizer with no relief. Is compliant with her prescribed Spiriva.  She endorses smoking one pack per day. She denies sick contacts. She denies hemoptysis, sob, n/v/d.     Past Medical History:   Diagnosis Date    Aortic atherosclerosis (CMS-HCC)     Baker's cyst     COPD (chronic obstructive pulmonary disease) (CMS-HCC)     CTS (carpal tunnel syndrome)     Depression     Disorder of skin or subcutaneous tissue     Headache(784.0)     HLD (hyperlipidemia)     Joint pain     Myelomalacia of cervical cord (CMS-HCC) 09/22/2019 Psoriasis     Psoriatic arthritis (CMS-HCC)     PTSD (post-traumatic stress disorder)     Scoliosis     Tobacco use        Patient Active Problem List   Diagnosis    Depressive disorder    Psoriasis    Tobacco use disorder    Carpal tunnel syndrome    Anxiety    Acute stress reaction    Grief at loss of child    Trigger thumb of right hand    Benzodiazepine misuse    COPD (chronic obstructive pulmonary disease) (CMS-HCC)    Chronic pain    Cervical spondylosis with myelopathy and radiculopathy    GI bleed    Epicondylitis, lateral    Trochanteric bursitis of right hip    Chronic idiopathic constipation    PTSD (post-traumatic stress disorder)    Myelomalacia of cervical cord (CMS-HCC)    History of nonmelanoma skin cancer       Past Surgical History:   Procedure Laterality Date    CARPAL TUNNEL RELEASE      COLONOSCOPY      HYSTERECTOMY      For endometriosis, ovaries still in place    KNEE SURGERY      PR ALLOGRAFT FOR SPINE SURGERY ONLY MORSELIZED N/A 09/22/2019    Procedure: ALLOGRAFT FOR SPINE SURGERY ONLY; MORSELIZED;  Surgeon: Nemiah Commander, MD;  Location: [facility] OR [facility];  Service: Orthopedics    PR ANTERIOR INSTRUMENTATION 4-7 VERTEBRAL SEGMENTS N/A 09/22/2019    Procedure: ANT INSTRUM; 4 TO 7 VERTEB SEGMT CERVICAL;  Surgeon: Nemiah Commander, MD;  [facility]: [facility] OR [facility];  Service: Orthopedics    PR ARTHRODESIS ANT INTERBODY INC DISCECTOMY, CERVICAL BELOW C2 N/A 09/22/2019    Procedure: ARTHRODES, ANT INTRBDY, INCL DISC SPC PREP, DISCECT, OSTEOPHYT/DECOMPRESS SPINL CRD &/OR NRV RT, CRV BLO C2;  Surgeon: Nemiah Commander, MD;  [facility];  Service: Orthopedics    PR ARTHRODESIS ANT INTERBODY INC DISCECTOMY, CERVICAL BELOW C2 EACH ADDL N/A 09/22/2019    Procedure: ARTHROD, ANT INTBDY, INCL DISC SPC PREP/DISCTMY/OSTEPHYT/DECMPR SPNL CRD/NRV RT; CERV BELO C2, EA ADD`L SPCx3;  Surgeon: Nemiah Commander, MD;  Location: [facility] OR [facility];  Service: Orthopedics    PR AUTOGRAFT SPINE SURGERY LOCAL FROM SAME INCISION N/A 09/22/2019    Procedure: AUTOGRAFT/SPINE SURG ONLY (W/HARVEST GRAFT); LOCAL (EG, RIB/SPINOUS PROC, LAM FRGMT) OBTAIN FROM SAME INCIS;  Surgeon: Nemiah Commander, MD;  [facility];  Service: Orthopedics    PR INSJ BIOMCHN DEV INTERVERTEBRAL [facility] W/ARTHRD N/A 09/22/2019    Procedure: INSERT INTERBODY BIOMECHANICAL DEVICE(S) WITH INTEGRAL ANTERIOR INSTRUMENT FOR DEVICE ANCHORING, WHEN PERFORMED, TO INTERVERTEBRAL DISC SPACE IN CONJUNCTION WITH INTERBODY ARTHRODESIS, EACH INTERSPACE x4;  Surgeon: Nemiah Commander, MD;  [facility]: [facility];  Service: Orthopedics    PR IONM 1 ON 1 IN OR W/ATTENDANCE EACH 15 MINUTES N/A 09/22/2019    Procedure: CONTINUOUS INTRAOPERATIVE NEUROPHYSIOLOGY MONITORING IN OR;  Surgeon: Nemiah Commander, MD;  Location: [facility] OR [facility];  Service: Orthopedics    PR REVISE MEDIAN N/CARPAL TUNNEL SURG Right 02/13/2014    Procedure: NEUROPLASTY AND/OR TRANSPOSITION; MEDIAN NERVE AT CARPAL TUNNEL;  Surgeon: Abram Sander, MD;  [facility];  Service: Orthopedics       No current facility-administered medications for this encounter.    Current Outpatient Medications:     acitretin (SORIATANE) 25 MG capsule, Take tablet every other day, Disp: 90 capsule, Rfl: 3    adalimumab 80 mg/0.8 mL-40 mg/0.4 mL PnKt, Inject the contents of 1 pen (80mg ) under the skin once for initial dose, then inject the contents of 1 pen (40mg ) every other week beginning 1 week after initial dose., Disp: 3 each, Rfl: 0    albuterol 2.5 mg /3 mL (0.083 %) nebulizer solution, Inhale 3 mL (2.5 mg total) by nebulization every six (6) hours as needed for wheezing or shortness of breath., Disp: 3 mL, Rfl: 1    albuterol HFA 90 mcg/actuation inhaler, Inhale 2 puffs by mouth every six (6) hours as needed for wheezing., Disp: 54 g, Rfl: 6    albuterol HFA 90 mcg/actuation inhaler, Inhale 2 puffs every four (4) hours as needed for wheezing or shortness of breath., Disp: 8 g, Rfl: 1 brompheniramine-pseudoephedrine-DM 2-30-10 mg/5 mL syrup, Take 5 mL by mouth four (4) times a day as needed., Disp: 120 mL, Rfl: 0    cetirizine (ZYRTEC) 10 MG tablet, Take 1 tablet (10 mg total) by mouth daily., Disp: 90 tablet, Rfl: 3    clobetasoL (TEMOVATE) 0.05 % ointment, Apply topically Two (2) times a day. To dry hands as needed, Disp: 60 g, Rfl: 1    DULoxetine (CYMBALTA) 60 MG capsule, Take 2 capsules (120 mg total) by mouth daily for 14 days., Disp: 28 capsule, Rfl: 0    empty container (BD SHARPS COLLECTOR) Misc, Use as directed., Disp: 1 each, Rfl: 3    fluticasone propionate (FLONASE) 50 mcg/actuation nasal spray, Use 2 sprays in each nostril daily., Disp: 16 g, Rfl: 4    folic acid (FOLVITE) 1 MG tablet, Take 1 tablet (1mg ) by mouth daily., Disp: 90 tablet, Rfl: 3    gabapentin (NEURONTIN) 300 MG capsule, TAKE 1 CAPSULE(300 MG) BY MOUTH THREE TIMES DAILY, Disp: 270 capsule, Rfl: 0    lidocaine 2% viscous (XYLOCAINE) 2 % Soln, 10 mL by Mouth route every three (3) hours as needed (sore throat). Gargle and spit, Disp: 100 mL, Rfl: 0    loratadine (CLARITIN) 10 mg tablet, , Disp: , Rfl:     meclizine (ANTIVERT) 12.5 mg tablet, Take 1 tablet (12.5 mg total) by mouth Three (3) times a day as needed., Disp: , Rfl:     montelukast (SINGULAIR) 10 mg tablet, Take 1 tablet (10 mg total) by mouth nightly for 20 days., Disp: 30 tablet, Rfl: 1    multivitamin with minerals tablet, Take 1 tablet by mouth daily., Disp: , Rfl:     senna (SENOKOT) 8.6 mg tablet, Take 1 tablet by mouth daily as needed for constipation. take 1 pill as needed for cosntipation may increase to 2 tabs if no relief, Disp: , Rfl:     syringe (BD TUBERCULIN SYRINGE) 1 mL 27 x 1/2 Syrg, Use as  directed for weekly methotrexate injections., Disp: 100 each, Rfl: PRN    tazarotene (AVAGE) 0.1 % cream, Mix with clobetasol and apply twice daily to rash on hands, Disp: 60 g, Rfl: 5    tiotropium bromide (SPIRIVA RESPIMAT) 1.25 mcg/actuation Mist, Inhale 2 puffs (2.5 mcg) BY MOUTH daily., Disp: 4 g, Rfl: 5    tiotropium-olodateroL (STIOLTO RESPIMAT) 2.5-2.5 mcg/actuation Mist, Inhale 2 puffs daily., Disp: , Rfl:     Allergies  Patient has no known allergies.    Family History   Problem Relation Age of Onset    COPD Mother     Osteoporosis Mother     Cancer Mother     Hypertension Father     Rashes / Skin problems Sister     Allergy (severe) Sister     Psoriasis Sister     No Known Problems Daughter     No Known Problems Maternal Grandmother     No Known Problems Maternal Grandfather     No Known Problems Paternal Grandmother     No Known Problems Paternal Grandfather     Melanoma Brother     Basal cell carcinoma Sister     Anesthesia problems Neg Hx     Broken bones Neg Hx     Cancer Neg Hx     Clotting disorder Neg Hx     Collagen disease Neg Hx     Diabetes Neg Hx     Dislocations Neg Hx     Fibromyalgia Neg Hx     Gout Neg Hx     Hemophilia Neg Hx     Rheumatologic disease Neg Hx     Scoliosis Neg Hx     Severe sprains Neg Hx     Sickle cell anemia Neg Hx     Spinal Compression Fracture Neg Hx     BRCA 1/2 Neg Hx     Breast cancer Neg Hx     Colon cancer Neg Hx     Endometrial cancer Neg Hx     Ovarian cancer Neg Hx     Melanoma Neg Hx     Basal cell carcinoma Neg Hx     Squamous cell carcinoma Neg Hx        Social History  Social History     Tobacco Use    Smoking status: Every Day     Packs/day: 1.00     Years: 20.00     Additional pack years: 0.00     Total pack years: 20.00     Types: Cigarettes     Start date: 01/02/1974    Smokeless tobacco: Never   Vaping Use    Vaping Use: Never used   Substance Use Topics    Alcohol use: No     Alcohol/week: 0.0 standard drinks of alcohol    Drug use: No          Physical Exam     This provider entered the patient's room: Yes:    If this provider did not enter the room, a comprehensive physical exam was not able to be performed due to increased infection risk to themselves, other providers, staff and other patients), as well as to conserve personal protective equipment (PPE) utilization during the COVID-19 pandemic.    If this provider did enter the patient room, the following was PPE worn: Surgical mask, eye protection and gloves    ED Triage Vitals [08/15/22 1302]   Enc Vitals Group      BP  138/99      Heart Rate 87      SpO2 Pulse       Resp 18      Temp 36.8 ??C (98.2 ??F)      Temp Source Oral      SpO2 98 %      Weight 61.2 kg (135 lb)     Constitutional: Alert and oriented. Well appearing and in no distress.  Eyes: Conjunctivae are normal.  ENT       Head: Normocephalic and atraumatic.       Nose: Mild rhinorrhea.       Mouth/Throat: Mucous membranes are moist.  Posterior oropharynx erythematous, beefy red with cobblestoning.  No tonsillar inflammation or exudate.       Ears: Bilateral TMs pearly gray with serous effusion posterior.  No erythema or inflammation.       Neck: No stridor.  Hematological/Lymphatic/Immunilogical: No cervical lymphadenopathy.  Cardiovascular: Normal rate, regular rhythm.  No murmurs, rubs or gallops.    Respiratory: Normal respiratory effort.  Speaking in full sentences without any difficulty.  Nonproductive cough noted.  Breath sounds are normal in all lobes.  Neurologic: Normal speech and language. No gross focal neurologic deficits are appreciated.  GCS 15.  Skin: Skin is warm, dry and intact. No rash noted.  Psychiatric: Mood and affect are normal. Speech and behavior are normal.     EKG     N/a     Radiology     XR Chest 2 views   Final Result      Negative chest.             Procedures     N/a      Attestations     Documentation assistance was provided by Mohammed Kindle, Scribe on August 15, 2022 at 1:07 PM for Rory Percy, FNP.     A scribe was used when documenting this visit. I agree with the above documentation. Signed by  Chrissie Noa, FNP on  August 15, 2022 at 3:39 PM         Chrissie Noa, FNP  08/15/22 1539

## 2022-09-10 NOTE — Unmapped (Signed)
The [facility] has made a third and final attempt to reach this patient to refill the following medication:Humira and Methotrexate.      We have left voicemails on the following phone numbers: [phone], have sent a MyChart message, and have sent a text message to the following phone numbers: [phone] .    Dates contacted: 10/18, 10/25, 12/27  Last scheduled delivery: 05/01/22 for 84 day supply of methotrexate and 06/16/22 for 28 day supply of Humira    The patient may be at risk of non-compliance with this medication. The patient should call the [facility] at [phone]  Option 4, then Option 2 (all other specialty patients) to refill medication.    Julianne Rice, PharmD   [facility] Specialty Pharmacist

## 2022-09-11 ENCOUNTER — Encounter: Payer: Self-pay | Admitting: Physician Assistant

## 2022-09-11 ENCOUNTER — Ambulatory Visit (INDEPENDENT_AMBULATORY_CARE_PROVIDER_SITE_OTHER): Payer: 59 | Admitting: Physician Assistant

## 2022-09-11 VITALS — BP 106/75 | HR 83 | Temp 98.7°F | Wt 139.8 lb

## 2022-09-11 DIAGNOSIS — J029 Acute pharyngitis, unspecified: Secondary | ICD-10-CM | POA: Diagnosis not present

## 2022-09-11 DIAGNOSIS — J432 Centrilobular emphysema: Secondary | ICD-10-CM | POA: Diagnosis not present

## 2022-09-11 DIAGNOSIS — R0981 Nasal congestion: Secondary | ICD-10-CM | POA: Diagnosis not present

## 2022-09-11 DIAGNOSIS — B9689 Other specified bacterial agents as the cause of diseases classified elsewhere: Secondary | ICD-10-CM

## 2022-09-11 DIAGNOSIS — J019 Acute sinusitis, unspecified: Secondary | ICD-10-CM

## 2022-09-11 DIAGNOSIS — H65499 Other chronic nonsuppurative otitis media, unspecified ear: Secondary | ICD-10-CM

## 2022-09-11 MED ORDER — FLUTICASONE PROPIONATE 50 MCG/ACT NA SUSP: 2.0000 | Freq: Every day | NASAL | 1 refills | Status: DC

## 2022-09-11 MED ORDER — CEFDINIR 300 MG PO CAPS: 300.0000 mg | ORAL_CAPSULE | Freq: Two times a day (BID) | ORAL | 0 refills | Status: AC

## 2022-09-11 NOTE — Unmapped (Signed)
[facility] - PHARMACIST NOTES    [facility] has been unable to reach patient about refill of Humira and Methotrexate. Left VM for patient with [facility] contact information.    Waverly Ferrari  PharmD Candidate [address] [employer] Amb[employer] Intern

## 2022-09-11 NOTE — Patient Instructions (Addendum)
Based on your symptoms and duration of illness, I believe you may have a bacterial sinus infection  These typically resolve with antibiotic therapy along with at-home comfort measures  Today I have sent in a prescription for Omnicef 300 mg by mouth twice per day for 7 days   FINISH THE ENTIRE COURSE unless you develop an allergic reaction or are instructed to discontinue.  Make sure you are using your inhalers and nebulizers as directed. These will help with your breathing   It can take a few days for the antibiotic to kick in so I recommend symptomatic relief with over the counter medication such as the following: Dayquil/ Nyquil Theraflu Alkaseltzer  Coricidin - if you have high blood pressure even if it is well managed with medications  These medications typically have Tylenol in them already so you can take Ibuprofen as needed for further pain/ discomfort and fever management/ do not need to supplement with more outside of those medications  Stay well hydrated with at least 75 oz of water per day to help with recovery  If you notice any of the following please let us know: increased fever not responding to Tylenol or Ibuprofen, swelling around your nose or eyes, difficulty seeing,

## 2022-09-11 NOTE — Progress Notes (Signed)
Acute Office Visit   Patient: Emily Johnston   DOB: 12/12/1960   60 y.o. Female  MRN: [id number] Visit Date: 09/11/2022  Today's healthcare provider: Dani Gobble Gergory Biello, PA-C  Introduced myself to the patient as a Journalist, newspaper and provided education on APPs in clinical practice.    Chief Complaint  Patient presents with   URI    Pt states she has been having a cough, congestion, sore throat, and body aches since Saturday/Sunday.    Subjective    URI  Associated symptoms include congestion, coughing, diarrhea, ear pain, headaches, nausea, sinus pain, a sore throat and vomiting. Pertinent negatives include no rhinorrhea or wheezing.   HPI     URI    Additional comments: Pt states she has been having a cough, congestion, sore throat, and body aches since Saturday/Sunday.       Last edited by Georgina Peer, CMA on 09/11/2022  2:48 PM.        URI type symptoms  Onset: sudden  Duration: since Sat,  12-23  Associated Symptoms: body aches, sore throat, mild coughing, ear pain, fatigue, nausea, diarrhea,  Reports productive cough with whitish sputum  Fever: yes- unsure of highest reading but says "it wasn't high high"  Recent sick contacts: no  COVID testing at home: none   Results: NA Interventions: Advil severe cold and flu, Tylenol  She states she thinks she is getting worse as time passes rather than improving  She reports she has not been using her inhalers lately    Medications: Outpatient Medications Prior to Visit  Medication Sig   acitretin (SORIATANE) 25 MG capsule Take 1 capsule by mouth daily.   albuterol (VENTOLIN HFA) 108 (90 Base) MCG/ACT inhaler Inhale 2 puffs into the lungs every 6 (six) hours as needed for wheezing or shortness of breath.   clobetasol ointment (TEMOVATE) 4.23 % Apply 1 application. topically 2 (two) times daily.   DULoxetine (CYMBALTA) 60 MG capsule Take 2 capsules (120 mg total) by mouth daily.   folic acid (FOLVITE) 1 MG  tablet Take 1 mg by mouth daily.   gabapentin (NEURONTIN) 300 MG capsule Take 300 mg by mouth 3 (three) times daily.   lactobacillus acidophilus & bulgar (LACTINEX) chewable tablet Chew 1 tablet by mouth 3 (three) times daily with meals.   meclizine (ANTIVERT) 12.5 MG tablet Take 1 tablet (12.5 mg total) by mouth 3 (three) times daily as needed for dizziness.   methotrexate 50 MG/2ML injection Inject 8 mLs into the skin once a week.   montelukast (SINGULAIR) 10 MG tablet Take 1 tablet (10 mg total) by mouth at bedtime.   Multiple Vitamin (MULTIVITAMIN) tablet Take 1 tablet by mouth daily.   STIOLTO RESPIMAT 2.5-2.5 MCG/ACT AERS INHALE 2 PUFFS BY MOUTH EVERY DAY   [DISCONTINUED] fluticasone (FLONASE) 50 MCG/ACT nasal spray Place 2 sprays into both nostrils daily.   Adalimumab (HUMIRA PEN) 40 MG/0.4ML PNKT Inject into the skin.   clonazePAM (KLONOPIN) 0.5 MG tablet Take 1 tablet (0.5 mg total) by mouth 2 (two) times daily as needed for anxiety. (Patient not taking: Reported on 09/11/2022)   [DISCONTINUED] benzonatate (TESSALON) 100 MG capsule Take 2 capsules (200 mg total) by mouth every 8 (eight) hours.   [DISCONTINUED] ipratropium (ATROVENT) 0.06 % nasal spray Place 2 sprays into both nostrils 4 (four) times daily.   [DISCONTINUED] mupirocin ointment (BACTROBAN) 2 % Apply 1 Application topically 2 (two) times daily.   [DISCONTINUED]  predniSONE (STERAPRED UNI-PAK 21 TAB) 10 MG (21) TBPK tablet Take by mouth daily. Take 6 tabs by mouth daily  for 2 days, then 5 tabs for 2 days, then 4 tabs for 2 days, then 3 tabs for 2 days, 2 tabs for 2 days, then 1 tab by mouth daily for 2 days   [DISCONTINUED] promethazine-dextromethorphan (PROMETHAZINE-DM) 6.25-15 MG/5ML syrup Take 5 mLs by mouth 4 (four) times daily as needed.   No facility-administered medications prior to visit.    Review of Systems  Constitutional:  Positive for chills, fatigue and fever.  HENT:  Positive for congestion, ear pain, sinus  pressure, sinus pain and sore throat. Negative for postnasal drip and rhinorrhea.   Respiratory:  Positive for cough. Negative for shortness of breath and wheezing.   Gastrointestinal:  Positive for diarrhea, nausea and vomiting.  Musculoskeletal:  Positive for myalgias.  Neurological:  Positive for headaches.       Objective    BP 106/75   Pulse 83   Temp 98.7 F (37.1 C) (Oral)   Wt 139 lb 12.8 oz (63.4 kg)   SpO2 96%   BMI 24.00 kg/m    Physical Exam Vitals reviewed.  Constitutional:      General: She is awake.     Appearance: Normal appearance. She is well-developed and well-groomed.  HENT:     Head: Normocephalic and atraumatic.     Right Ear: Hearing, ear canal and external ear normal. A middle ear effusion is present. No hemotympanum. Tympanic membrane is bulging. Tympanic membrane is not injected, scarred, perforated, erythematous or retracted.     Left Ear: Hearing, tympanic membrane, ear canal and external ear normal. No hemotympanum. Tympanic membrane is not injected, scarred, perforated, erythematous, retracted or bulging.     Mouth/Throat:     Lips: Pink.     Mouth: Mucous membranes are moist.     Pharynx: Oropharynx is clear. Uvula midline. No oropharyngeal exudate or posterior oropharyngeal erythema.  Eyes:     Extraocular Movements: Extraocular movements intact.     Conjunctiva/sclera: Conjunctivae normal.     Pupils: Pupils are equal, round, and reactive to light.  Cardiovascular:     Rate and Rhythm: Normal rate and regular rhythm.     Pulses: Normal pulses.     Heart sounds: Normal heart sounds. No murmur heard.    No friction rub. No gallop.  Pulmonary:     Effort: Pulmonary effort is normal.     Breath sounds: Normal breath sounds. Decreased air movement present. No decreased breath sounds, wheezing, rhonchi or rales.  Musculoskeletal:     Cervical back: Normal range of motion.     Right lower leg: No edema.     Left lower leg: No edema.   Lymphadenopathy:     Head:     Right side of head: No submental, submandibular or preauricular adenopathy.     Left side of head: No submental, submandibular or preauricular adenopathy.     Cervical:     Right cervical: No superficial or posterior cervical adenopathy.    Left cervical: No superficial or posterior cervical adenopathy.  Neurological:     General: No focal deficit present.     Mental Status: She is alert and oriented to person, place, and time.  Psychiatric:        Mood and Affect: Mood normal.        Behavior: Behavior normal. Behavior is cooperative.        Thought Content: Thought  content normal.        Judgment: Judgment normal.       Results for orders placed or performed in visit on 09/11/22  Rapid Strep screen([facility])   Specimen: Other   Other  Result Value Ref Range   Strep Gp A Ag, IA W/Reflex Negative Negative  Culture, Group A Strep   Other  Result Value Ref Range   Strep A Culture WILL FOLLOW   Veritor Flu A/B Waived  Result Value Ref Range   Influenza A Negative Negative   Influenza B Negative Negative    Assessment & Plan      No follow-ups on file.        Problem List Items Addressed This Visit       Respiratory   Centrilobular emphysema (HCC)   Relevant Medications   fluticasone (FLONASE) 50 MCG/ACT nasal spray   cefdinir (OMNICEF) 300 MG capsule   Other Visit Diagnoses     Acute bacterial rhinosinusitis    -  Primary Acute, new concern Reports sinus pressure, sinus congestion, sore throat, ear pain and body aches for 5 days that is getting worse She has a hx of emphysema and is still smoking  Concerned for COPD exacerbation and worsening symptoms necessitates abx therapy at this time Will start Omnicef 300 mg PO BID x 7 days to assist with suspected sinusitis  Recommend she use Flonase, Mucinex and OTC medications as needed for symptomatic relief Reviewed negative Flu and strep results with her during  apt. Recommend she improve her utilization of her inhalers and nebulizer during acute illness to prevent breathing difficulty Reviewed ED and return precautions Follow up as needed for persistent or progressing symptoms     Relevant Medications   fluticasone (FLONASE) 50 MCG/ACT nasal spray   cefdinir (OMNICEF) 300 MG capsule   Congestion of nasal sinus     Refills provided to assist with chronic otitis media with effusion and nasal congestion     Relevant Medications   fluticasone (FLONASE) 50 MCG/ACT nasal spray   Other Relevant Orders   Novel Coronavirus, NAA ([facility])   Veritor Flu A/B Waived (Completed)   Sore throat       Relevant Orders   Rapid Strep screen([facility]) (Completed)   Novel Coronavirus, NAA ([facility])   Veritor Flu A/B Waived (Completed)   Culture, Group A Strep (Completed)   Chronic otitis media with effusion, unspecified laterality       Relevant Medications   cefdinir (OMNICEF) 300 MG capsule   Other Relevant Orders   Ambulatory referral to ENT        No follow-ups on file.   I, Arihanna Estabrook E Catia Todorov, PA-C, have reviewed all documentation for this visit. The documentation on 09/11/22 for the exam, diagnosis, procedures, and orders are all accurate and complete.   Talitha Givens, MHS, PA-C [facility]

## 2022-09-12 ENCOUNTER — Encounter: Payer: Self-pay | Admitting: Physician Assistant

## 2022-09-12 ENCOUNTER — Ambulatory Visit: Payer: 59

## 2022-09-13 LAB — NOVEL CORONAVIRUS, NAA: SARS-CoV-2, NAA: NOT DETECTED

## 2022-09-14 LAB — CULTURE, GROUP A STREP: Strep A Culture: NEGATIVE

## 2022-09-14 LAB — VERITOR FLU A/B WAIVED
Influenza A: NEGATIVE
Influenza B: NEGATIVE

## 2022-09-14 LAB — RAPID STREP SCREEN (MED CTR MEBANE ONLY): Strep Gp A Ag, IA W/Reflex: NEGATIVE

## 2022-09-19 NOTE — Unmapped (Signed)
[facility] - PHARMACIST NOTES    [facility] has been unable to reach patient about refill of Humira and Methotrexate. Left VM for patient with [facility] contact information. 2nd attempt to reach patient, no further attempts.    Waverly Ferrari  PharmD Candidate [address] [employer] Ambulatory Care Intern

## 2022-09-24 ENCOUNTER — Telehealth: Payer: Self-pay | Admitting: Nurse Practitioner

## 2022-09-24 NOTE — Telephone Encounter (Signed)
Copied from [place] [id number]. Topic: Medicare AWV >> Sep 24, 2022  2:47 PM Josephina Gip wrote: Reason for CRM: Left message for patient to call back and schedule the Medicare Annual Wellness Visit (AWV) virtually or by telephone.  Last AWV 09/03/21  Please schedule at anytime with [facility]-Nurse Health Advisor.   Any questions, please call me at [phone]

## 2022-10-03 ENCOUNTER — Other Ambulatory Visit: Payer: Self-pay | Admitting: Physician Assistant

## 2022-10-03 DIAGNOSIS — R0981 Nasal congestion: Secondary | ICD-10-CM

## 2022-10-03 NOTE — Telephone Encounter (Signed)
Last RF 09/11/22 15.8 ml 1 RF- pt has appt exactly when med is due-  Requested Prescriptions  Refused Prescriptions Disp Refills   fluticasone (FLONASE) 50 MCG/ACT nasal spray [Pharmacy Med Name: FLUTICASONE PROP 50 MCG SPRAY] 48 mL 1    Sig: SPRAY 2 SPRAYS INTO EACH NOSTRIL EVERY DAY     Ear, Nose, and Throat: Nasal Preparations - Corticosteroids Passed - 10/03/2022  9:31 AM      Passed - Valid encounter within last 12 months    Recent Outpatient Visits           3 weeks ago Acute bacterial rhinosinusitis   [place] [facility] Mecum, Erin E, PA-C   4 months ago Centrilobular emphysema ([place])   [place], [place] T, NP   8 months ago Severe recurrent major depression without psychotic features ([place])   [place] Cannady, Barbaraann Faster, NP   9 months ago Centrilobular emphysema ([place])   [place], Henrine Screws T, NP   9 months ago Functional diarrhea   [place], Avanti, MD       Future Appointments             In 1 month Cannady, Barbaraann Faster, NP [place], [facility]

## 2022-10-12 ENCOUNTER — Emergency Department: Admit: 2022-10-12 | Discharge: 2022-10-13 | Disposition: A | Discharge status: Home / Self Care | Payer: MEDICARE

## 2022-10-12 ENCOUNTER — Ambulatory Visit: Admit: 2022-10-12 | Discharge: 2022-10-13 | Disposition: A | Discharge status: Home / Self Care | Payer: MEDICARE

## 2022-10-12 DIAGNOSIS — R1031 Right lower quadrant pain: Principal | ICD-10-CM

## 2022-10-12 LAB — CBC W/ AUTO DIFF
BASOPHILS ABSOLUTE COUNT: 0.1 10*9/L (ref 0.0–0.1)
BASOPHILS RELATIVE PERCENT: 1.6 %
EOSINOPHILS ABSOLUTE COUNT: 0.1 10*9/L (ref 0.0–0.5)
EOSINOPHILS RELATIVE PERCENT: 1.1 %
HEMATOCRIT: 41.3 % (ref 34.0–44.0)
HEMOGLOBIN: 13.8 g/dL (ref 11.3–14.9)
LYMPHOCYTES ABSOLUTE COUNT: 2.6 10*9/L (ref 1.1–3.6)
LYMPHOCYTES RELATIVE PERCENT: 35.4 %
MEAN CORPUSCULAR HEMOGLOBIN CONC: 33.5 g/dL (ref 32.0–36.0)
MEAN CORPUSCULAR HEMOGLOBIN: 32.1 pg (ref 25.9–32.4)
MEAN CORPUSCULAR VOLUME: 95.7 fL (ref 77.6–95.7)
MEAN PLATELET VOLUME: 8.9 fL (ref 6.8–10.7)
MONOCYTES ABSOLUTE COUNT: 0.8 10*9/L (ref 0.3–0.8)
MONOCYTES RELATIVE PERCENT: 10.6 %
NEUTROPHILS ABSOLUTE COUNT: 3.8 10*9/L (ref 1.8–7.8)
NEUTROPHILS RELATIVE PERCENT: 51.3 %
NUCLEATED RED BLOOD CELLS: 0 /100{WBCs} (ref <=4)
PLATELET COUNT: 232 10*9/L (ref 150–450)
RED BLOOD CELL COUNT: 4.32 10*12/L (ref 3.95–5.13)
RED CELL DISTRIBUTION WIDTH: 14.4 % (ref 12.2–15.2)
WBC ADJUSTED: 7.4 10*9/L (ref 3.6–11.2)

## 2022-10-12 LAB — URINALYSIS WITH MICROSCOPY WITH CULTURE REFLEX
BACTERIA: NONE SEEN /HPF
BILIRUBIN UA: NEGATIVE
GLUCOSE UA: NEGATIVE
KETONES UA: NEGATIVE
LEUKOCYTE ESTERASE UA: NEGATIVE
NITRITE UA: NEGATIVE
PH UA: 5.5 (ref 5.0–9.0)
PROTEIN UA: NEGATIVE
RBC UA: <1 /HPF (ref <=4)
SPECIFIC GRAVITY UA: 1.006 (ref 1.003–1.030)
SQUAMOUS EPITHELIAL: <1 /HPF (ref 0–5)
UROBILINOGEN UA: <2
WBC UA: 1 /HPF (ref 0–5)

## 2022-10-12 LAB — BASIC METABOLIC PANEL
ANION GAP: 4 mmol/L — ABNORMAL LOW (ref 5–14)
BLOOD UREA NITROGEN: 8 mg/dL — ABNORMAL LOW (ref 9–23)
BUN / CREAT RATIO: 11
CALCIUM: 9.8 mg/dL (ref 8.7–10.4)
CHLORIDE: 110 mmol/L — ABNORMAL HIGH (ref 98–107)
CO2: 28.5 mmol/L (ref 20.0–31.0)
CREATININE: 0.7 mg/dL
EGFR CKD-EPI (2021) FEMALE: >90 mL/min/{1.73_m2} (ref >=60)
GLUCOSE RANDOM: 91 mg/dL (ref 70–179)
POTASSIUM: 3.7 mmol/L (ref 3.4–4.8)
SODIUM: 142 mmol/L (ref 135–145)

## 2022-10-12 LAB — HEPATIC FUNCTION PANEL
ALBUMIN: 3.7 g/dL (ref 3.4–5.0)
ALKALINE PHOSPHATASE: 93 U/L (ref 46–116)
ALT (SGPT): 13 U/L (ref 10–49)
AST (SGOT): 20 U/L (ref <=34)
BILIRUBIN DIRECT: 0.1 mg/dL (ref 0.00–0.30)
BILIRUBIN TOTAL: 0.4 mg/dL (ref 0.3–1.2)
PROTEIN TOTAL: 6.9 g/dL (ref 5.7–8.2)

## 2022-10-12 LAB — LIPASE: LIPASE: 36 U/L (ref 12–53)

## 2022-10-12 MED ADMIN — iohexol (OMNIPAQUE) 350 mg iodine/mL solution 100 mL: 100 mL | INTRAVENOUS | @ 22:00:00 | Stop: 2022-10-12

## 2022-10-12 MED ADMIN — ondansetron (ZOFRAN) injection 4 mg: 4 mg | INTRAVENOUS | @ 21:00:00 | Stop: 2022-10-12

## 2022-10-12 MED ADMIN — morphine 4 mg/mL injection 4 mg: 4 mg | INTRAVENOUS | Stop: 2022-10-12

## 2022-10-12 MED ADMIN — morphine 4 mg/mL injection 2 mg: 2 mg | INTRAVENOUS | @ 21:00:00 | Stop: 2022-10-12

## 2022-10-12 NOTE — Unmapped (Signed)
Pt presents with sharp constant right side abdominal pain since yesterday. Pt denies N/V/D or any other symptoms.

## 2022-10-13 NOTE — Unmapped (Signed)
[facility]  Emergency Department Provider Note        ED Clinical Impression      Final diagnoses:   Right lower quadrant abdominal pain (Primary)           Impression, ED Course, Assessment and Plan      Impression: Michele Miller is a 62 y.o. female with PMH significant for aortic atherosclerosis, COPD, CTS, HLD, PTSD, psoriatic arthritis, depression who presents to the emergency department for abdominal pain.  VSS in triage, nontoxic in appearance.  On exam, her abdomen is soft and nondistended.  Audible bowel sounds in all 4 quadrants.  Discomfort with palpation overlying the periumbilical area, McBurney's and RLQ, no significant rebound or guarding.  Negative psoas, obturator and Rovsing.  No CVA TTP.    Ddx includes appendicitis vs ovarian cyst vs nephrolithiasis vs less likely cystitis. Plan to obtain basic labs including lipase, UA.  Will obtain CT abdomen and give morphine and zofran for symptomatic control.     7:10 PM  CT unremarkable.  Have discussed results with patient.  Recommended bland diet, OTC meds for pain control.  Hydration, rest.  PCP follow-up if symptoms persist.  Strict reevaluation criteria were discussed and patient verbalized understanding agreeable to discharge.         Additional Medical Decision Making     I have reviewed the vital signs and the nursing notes. Labs and radiology results that were available during my care of the patient were independently reviewed by me and considered in my medical decision making.     I independently visualized the radiology images.   I reviewed the patient's prior medical records.     Portions of this record have been created using Scientist, clinical (histocompatibility and immunogenetics). Dictation errors have been sought, but may not have been identified and corrected.  ____________________________________________         History        Chief Complaint  Abdominal Pain      HPI   Michele Miller is a 62 y.o. female with PMH significant for aortic atherosclerosis, COPD, CTS, HLD, PTSD, psoriatic arthritis, depression who presents to the emergency department for abdominal pain.  RLQ pain began yesterday afternoon while patient was laying supine. Described as a sharp pain, 8/10 in severity that radiates posteriorly. Pain is exacerbated by laying supine. Taken two tylenol this morning without relief.  Has not found any significant alleviating factors.  Has not eaten today as she has a poor appetite, but ate last night without any N/V. Today, states pain is a dull 6/10 ache. Reports no fever, chest pain, SOB, known sick contacts, N/V, vaginal symptoms, dysuria hematuria.  Denies any history of nephrolithiasis.  Does endorse a cough, which is normal per patient baseline.       Past Medical History:   Diagnosis Date    Aortic atherosclerosis (CMS-HCC)     Baker's cyst     COPD (chronic obstructive pulmonary disease) (CMS-HCC)     CTS (carpal tunnel syndrome)     Depression     Disorder of skin or subcutaneous tissue     Headache(784.0)     HLD (hyperlipidemia)     Joint pain     Myelomalacia of cervical cord (CMS-HCC) 09/22/2019    Psoriasis     Psoriatic arthritis (CMS-HCC)     PTSD (post-traumatic stress disorder)     Scoliosis     Tobacco use        Patient  Active Problem List   Diagnosis    Depressive disorder    Psoriasis    Tobacco use disorder    Carpal tunnel syndrome    Anxiety    Acute stress reaction    Grief at loss of child    Trigger thumb of right hand    Benzodiazepine misuse    COPD (chronic obstructive pulmonary disease) (CMS-HCC)    Chronic pain    Cervical spondylosis with myelopathy and radiculopathy    GI bleed    Epicondylitis, lateral    Trochanteric bursitis of right hip    Chronic idiopathic constipation    PTSD (post-traumatic stress disorder)    Myelomalacia of cervical cord (CMS-HCC)    History of nonmelanoma skin cancer       Past Surgical History:   Procedure Laterality Date    CARPAL TUNNEL RELEASE      COLONOSCOPY HYSTERECTOMY      For endometriosis, ovaries still in place    KNEE SURGERY      PR ALLOGRAFT FOR SPINE SURGERY ONLY MORSELIZED N/A 09/22/2019    Procedure: ALLOGRAFT FOR SPINE SURGERY ONLY; MORSELIZED;  Surgeon: Nemiah Commander, MD;  [facility]: [facility] OR [facility];  Service: Orthopedics    PR ANTERIOR INSTRUMENTATION 4-7 VERTEBRAL SEGMENTS N/A 09/22/2019    Procedure: ANT INSTRUM; 4 TO 7 VERTEB SEGMT CERVICAL;  Surgeon: Nemiah Commander, MD;  Location: [facility] OR [facility];  Service: Orthopedics    PR ARTHRODESIS ANT INTERBODY INC DISCECTOMY, CERVICAL BELOW C2 N/A 09/22/2019    Procedure: ARTHRODES, ANT INTRBDY, INCL DISC SPC PREP, DISCECT, OSTEOPHYT/DECOMPRESS SPINL CRD &/OR NRV RT, CRV BLO C2;  Surgeon: Nemiah Commander, MD;  Location[facility] OR [facility];  Service: Orthopedics    PR ARTHRODESIS ANT INTERBODY INC DISCECTOMY, CERVICAL BELOW C2 EACH ADDL N/A 09/22/2019    Procedure: ARTHROD, ANT INTBDY, INCL DISC SPC PREP/DISCTMY/OSTEPHYT/DECMPR SPNL CRD/NRV RT; CERV BELO C2, EA ADD`L SPCx3;  Surgeon: Nemiah Commander, MD;  [facility] OR [facility];  Service: Orthopedics    PR AUTOGRAFT SPINE SURGERY LOCAL FROM SAME INCISION N/A 09/22/2019    Procedure: AUTOGRAFT/SPINE SURG ONLY (W/HARVEST GRAFT); LOCAL (EG, RIB/SPINOUS PROC, LAM FRGMT) OBTAIN FROM SAME INCIS;  Surgeon: Nemiah Commander, MD;  Location: [facility] OR [facility];  Service: Orthopedics    PR INSJ BIOMCHN DEV INTERVERTEBRAL [facility] W/ARTHRD N/A 09/22/2019    Procedure: INSERT INTERBODY BIOMECHANICAL DEVICE(S) WITH INTEGRAL ANTERIOR INSTRUMENT FOR DEVICE ANCHORING, WHEN PERFORMED, TO INTERVERTEBRAL DISC SPACE IN CONJUNCTION WITH INTERBODY ARTHRODESIS, EACH INTERSPACE x4;  Surgeon: Nemiah Commander, MD;  [facility]: [facility] OR [facility];  Service: Orthopedics    PR IONM 1 ON 1 IN OR W/ATTENDANCE EACH 15 MINUTES N/A 09/22/2019    Procedure: CONTINUOUS INTRAOPERATIVE NEUROPHYSIOLOGY MONITORING IN OR;  Surgeon: Nemiah Commander, MD;  Location: [facility] OR [facility];  Service: Orthopedics    PR REVISE MEDIAN N/CARPAL TUNNEL SURG Right 02/13/2014    Procedure: NEUROPLASTY AND/OR TRANSPOSITION; MEDIAN NERVE AT CARPAL TUNNEL;  Surgeon: Abram Sander, MD;  [facility];  Service: Orthopedics       No current facility-administered medications for this encounter.    Current Outpatient Medications:     acitretin (SORIATANE) 25 MG capsule, Take tablet every other day, Disp: 90 capsule, Rfl: 3    adalimumab 80 mg/0.8 mL-40 mg/0.4 mL PnKt, Inject the contents of 1 pen (80mg ) under the skin once for initial dose, then inject the contents of 1 pen (  40mg ) every other week beginning 1 week after initial dose., Disp: 3 each, Rfl: 0    albuterol 2.5 mg /3 mL (0.083 %) nebulizer solution, Inhale 3 mL (2.5 mg total) by nebulization every six (6) hours as needed for wheezing or shortness of breath., Disp: 3 mL, Rfl: 1    albuterol HFA 90 mcg/actuation inhaler, Inhale 2 puffs by mouth every six (6) hours as needed for wheezing., Disp: 54 g, Rfl: 6    albuterol HFA 90 mcg/actuation inhaler, Inhale 2 puffs every four (4) hours as needed for wheezing or shortness of breath., Disp: 8 g, Rfl: 1    brompheniramine-pseudoephedrine-DM 2-30-10 mg/5 mL syrup, Take 5 mL by mouth four (4) times a day as needed., Disp: 120 mL, Rfl: 0    cetirizine (ZYRTEC) 10 MG tablet, Take 1 tablet (10 mg total) by mouth daily., Disp: 90 tablet, Rfl: 3    DULoxetine (CYMBALTA) 60 MG capsule, Take 2 capsules (120 mg total) by mouth daily for 14 days., Disp: 28 capsule, Rfl: 0    empty container (BD SHARPS COLLECTOR) Misc, Use as directed., Disp: 1 each, Rfl: 3    fluticasone propionate (FLONASE) 50 mcg/actuation nasal spray, Use 2 sprays in each nostril daily., Disp: 16 g, Rfl: 4    gabapentin (NEURONTIN) 300 MG capsule, TAKE 1 CAPSULE(300 MG) BY MOUTH THREE TIMES DAILY, Disp: 270 capsule, Rfl: 0    lidocaine 2% viscous (XYLOCAINE) 2 % Soln, 10 mL by Mouth route every three (3) hours as needed (sore throat). Gargle and spit, Disp: 100 mL, Rfl: 0    loratadine (CLARITIN) 10 mg tablet, , Disp: , Rfl:     meclizine (ANTIVERT) 12.5 mg tablet, Take 1 tablet (12.5 mg total) by mouth Three (3) times a day as needed., Disp: , Rfl:     montelukast (SINGULAIR) 10 mg tablet, Take 1 tablet (10 mg total) by mouth nightly for 20 days., Disp: 30 tablet, Rfl: 1    multivitamin with minerals tablet, Take 1 tablet by mouth daily., Disp: , Rfl:     senna (SENOKOT) 8.6 mg tablet, Take 1 tablet by mouth daily as needed for constipation. take 1 pill as needed for cosntipation may increase to 2 tabs if no relief, Disp: , Rfl:     syringe (BD TUBERCULIN SYRINGE) 1 mL 27 x 1/2 Syrg, Use as directed for weekly methotrexate injections., Disp: 100 each, Rfl: PRN    tazarotene (AVAGE) 0.1 % cream, Mix with clobetasol and apply twice daily to rash on hands, Disp: 60 g, Rfl: 5    tiotropium bromide (SPIRIVA RESPIMAT) 1.25 mcg/actuation Mist, Inhale 2 puffs (2.5 mcg) BY MOUTH daily., Disp: 4 g, Rfl: 5    tiotropium-olodateroL (STIOLTO RESPIMAT) 2.5-2.5 mcg/actuation Mist, Inhale 2 puffs daily., Disp: , Rfl:     Allergies  Patient has no known allergies.    Family History   Problem Relation Age of Onset    COPD Mother     Osteoporosis Mother     Cancer Mother     Hypertension Father     Rashes / Skin problems Sister     Allergy (severe) Sister     Psoriasis Sister     No Known Problems Daughter     No Known Problems Maternal Grandmother     No Known Problems Maternal Grandfather     No Known Problems Paternal Grandmother     No Known Problems Paternal Grandfather     Melanoma Brother     Basal  cell carcinoma Sister     Anesthesia problems Neg Hx     Broken bones Neg Hx     Cancer Neg Hx     Clotting disorder Neg Hx     Collagen disease Neg Hx     Diabetes Neg Hx     Dislocations Neg Hx     Fibromyalgia Neg Hx     Gout Neg Hx     Hemophilia Neg Hx     Rheumatologic disease Neg Hx     Scoliosis Neg Hx     Severe sprains Neg Hx     Sickle cell anemia Neg Hx     Spinal Compression Fracture Neg Hx     BRCA 1/2 Neg Hx     Breast cancer Neg Hx     Colon cancer Neg Hx     Endometrial cancer Neg Hx     Ovarian cancer Neg Hx     Melanoma Neg Hx     Basal cell carcinoma Neg Hx     Squamous cell carcinoma Neg Hx        Social History  Social History     Tobacco Use    Smoking status: Every Day     Current packs/day: 1.00     Average packs/day: 1 pack/day for 48.8 years (48.8 ttl pk-yrs)     Types: Cigarettes     Start date: 01/02/1974    Smokeless tobacco: Never   Vaping Use    Vaping Use: Never used   Substance Use Topics    Alcohol use: No     Alcohol/week: 0.0 standard drinks of alcohol    Drug use: No          Physical Exam     This provider entered the patient's room: Yes:    If this provider did not enter the room, a comprehensive physical exam was not able to be performed due to increased infection risk to themselves, other providers, staff and other patients), as well as to conserve personal protective equipment (PPE) utilization during the COVID-19 pandemic.    If this provider did enter the patient room, the following was PPE worn: Surgical mask, eye protection and gloves    ED Triage Vitals [10/12/22 1403]   Enc Vitals Group      BP 135/89      Pulse       SpO2 Pulse 84      Resp 18      Temp 37 ??C (98.6 ??F)      Temp Source Oral      SpO2 96 %     Constitutional: Alert and oriented. Well appearing and in no distress.  Eyes: Conjunctivae are normal.  Cardiovascular: Normal rate, regular rhythm. No murmurs, rubs or gallops.  Respiratory: Normal respiratory effort. Coarse breath sounds in right lung base.  Gastrointestinal: Soft and nondistended.  Audible bowel sounds in all 4 quadrants.  Discomfort with palpation overlying the periumbilical area, McBurney's and RLQ, no significant rebound or guarding.  Negative psoas, obturator and Rovsing.  No CVA TTP.  Neurologic: Normal speech and language. No gross focal neurologic deficits are appreciated.  GCS 15.  Skin: Skin is warm, dry and intact. No rash noted.  Psychiatric: Mood and affect are normal. Speech and behavior are normal.     EKG     N/a     Radiology     CT Abdomen Pelvis W IV Contrast Only   Final Result   No acute  abnormality identified in the abdomen or pelvis.                Procedures     N/a           Chrissie Noa, FNP  10/12/22 1918

## 2022-10-29 NOTE — Unmapped (Signed)
Specialty Medication(s): Humira and methotrexate    [name] has been dis-enrolled from the [facility] specialty pharmacy services due to multiple unsuccessful outreach attempts by the pharmacy.    Additional information provided to the patient: last filled Humira 06/16/22 (28 days), methotrexate on 05/01/22 (84 day)    Julianne Rice, PharmD  [facility] Specialty Pharmacist

## 2022-11-03 ENCOUNTER — Other Ambulatory Visit: Payer: Self-pay | Admitting: Physician Assistant

## 2022-11-03 DIAGNOSIS — R0981 Nasal congestion: Secondary | ICD-10-CM

## 2022-11-04 NOTE — Telephone Encounter (Signed)
Unable to refill per protocol, Rx request is too soon. Last refill 09/11/22 for 15.8 and 1 refill.  Requested Prescriptions  Pending Prescriptions Disp Refills   fluticasone (FLONASE) 50 MCG/ACT nasal spray [Pharmacy Med Name: FLUTICASONE PROP 50 MCG SPRAY] 16 mL 1    Sig: SPRAY 2 SPRAYS INTO EACH NOSTRIL EVERY DAY     Ear, Nose, and Throat: Nasal Preparations - Corticosteroids Passed - 11/03/2022  1:29 AM      Passed - Valid encounter within last 12 months    Recent Outpatient Visits           1 month ago Acute bacterial rhinosinusitis   [place] [facility] Mecum, Erin E, PA-C   5 months ago Centrilobular emphysema ([place])   [place], Brillion T, NP   9 months ago Severe recurrent major depression without psychotic features ([place])   [place] Cannady, Henrine Screws T, NP   10 months ago Centrilobular emphysema ([place])   [place], Barbaraann Faster, NP   10 months ago Functional diarrhea   [place] Vigg, Avanti, MD       Future Appointments             In 1 week Cannady, Barbaraann Faster, NP [place], [facility]

## 2022-11-09 NOTE — Patient Instructions (Signed)

## 2022-11-10 ENCOUNTER — Telehealth: Payer: Self-pay | Admitting: Nurse Practitioner

## 2022-11-10 NOTE — Telephone Encounter (Signed)
Copied from [place] [id number]. Topic: Medicare AWV >> Nov 10, 2022 11:04 AM Devoria Glassing wrote: Reason for CRM: Called patient to schedule Medicare Annual Wellness Visit (AWV). Left message for patient to call back and schedule Medicare Annual Wellness Visit (AWV).  Last date of AWV: 09/03/2021  Please schedule an appointment at any time with Kirke Shaggy, NHA  .  If any questions, please contact me.  Thank you ,  Sherol Dade; [place] Direct Dial: [phone]

## 2022-11-12 ENCOUNTER — Ambulatory Visit
Admission: RE | Admit: 2022-11-12 | Discharge: 2022-11-12 | Disposition: A | Discharge status: Home / Self Care | Payer: 59 | Source: Ambulatory Visit | Attending: Nurse Practitioner | Admitting: Nurse Practitioner

## 2022-11-12 DIAGNOSIS — F17210 Nicotine dependence, cigarettes, uncomplicated: Secondary | ICD-10-CM | POA: Diagnosis present

## 2022-11-12 DIAGNOSIS — Z87891 Personal history of nicotine dependence: Secondary | ICD-10-CM | POA: Insufficient documentation

## 2022-11-13 ENCOUNTER — Ambulatory Visit (INDEPENDENT_AMBULATORY_CARE_PROVIDER_SITE_OTHER): Payer: 59 | Admitting: Nurse Practitioner

## 2022-11-13 ENCOUNTER — Encounter: Payer: Self-pay | Admitting: Nurse Practitioner

## 2022-11-13 ENCOUNTER — Other Ambulatory Visit: Payer: Self-pay | Admitting: Nurse Practitioner

## 2022-11-13 VITALS — BP 122/80 | HR 67 | Temp 97.8°F | Ht 64.02 in | Wt 134.3 lb

## 2022-11-13 DIAGNOSIS — Z0000 Encounter for general adult medical examination without abnormal findings: Secondary | ICD-10-CM

## 2022-11-13 DIAGNOSIS — E7800 Pure hypercholesterolemia, unspecified: Secondary | ICD-10-CM

## 2022-11-13 DIAGNOSIS — J432 Centrilobular emphysema: Secondary | ICD-10-CM

## 2022-11-13 DIAGNOSIS — E559 Vitamin D deficiency, unspecified: Secondary | ICD-10-CM

## 2022-11-13 DIAGNOSIS — F17219 Nicotine dependence, cigarettes, with unspecified nicotine-induced disorders: Secondary | ICD-10-CM

## 2022-11-13 DIAGNOSIS — F332 Major depressive disorder, recurrent severe without psychotic features: Secondary | ICD-10-CM

## 2022-11-13 DIAGNOSIS — Z803 Family history of malignant neoplasm of breast: Secondary | ICD-10-CM

## 2022-11-13 DIAGNOSIS — L4050 Arthropathic psoriasis, unspecified: Secondary | ICD-10-CM

## 2022-11-13 DIAGNOSIS — M797 Fibromyalgia: Secondary | ICD-10-CM

## 2022-11-13 DIAGNOSIS — I700 Atherosclerosis of aorta: Secondary | ICD-10-CM | POA: Diagnosis not present

## 2022-11-13 DIAGNOSIS — Z1211 Encounter for screening for malignant neoplasm of colon: Secondary | ICD-10-CM

## 2022-11-13 DIAGNOSIS — F4310 Post-traumatic stress disorder, unspecified: Secondary | ICD-10-CM

## 2022-11-13 MED ORDER — BREZTRI AEROSPHERE 160-9-4.8 MCG/ACT IN AERO: 2.0000 | INHALATION_SPRAY | Freq: Two times a day (BID) | RESPIRATORY_TRACT | 11 refills | Status: DC

## 2022-11-13 MED ORDER — CLONAZEPAM 0.5 MG PO TABS: 0.5000 mg | ORAL_TABLET | Freq: Two times a day (BID) | ORAL | 0 refills | Status: DC | PRN

## 2022-11-13 NOTE — Assessment & Plan Note (Addendum)
Chronic, ongoing.  Continue collaboration with rheumatology & dermatology at [facility] and current medication regimen.  Reviewed recent notes.  Will obtain CMP today.  Referral to local rheumatology and dermatology due to insurance changes.  Will also place ortho referral due to changes.

## 2022-11-13 NOTE — Progress Notes (Signed)
BP 122/80   Pulse 67   Temp 97.8 F (36.6 C) (Oral)   Ht 5' 4.02" (1.626 m)   Wt 134 lb 4.8 oz (60.9 kg)   SpO2 98%   BMI 23.04 kg/m    Subjective:    Patient ID: Emily Johnston, female    DOB: September 10, 1961, 62 y.o.   MRN: [id number]  HPI: Emily Johnston is a 62 y.o. female presenting on 11/13/2022 for Medicare Wellness and comprehensive medical examination. Current medical complaints include: none  She currently lives with: self Menopausal Symptoms: no  PSORIATIC ARTHRITIS: Followed by rheumatology at [facility], last seen 04/30/22, and dermatology last 09/23/21 -- has no scheduled follow-up, she reports [facility] will longer cover [facility] starting after April 1st. To be continuing on Methotrexate 25 MG weekly, Acitretin 25 MG every other day, and Humira + Clobetasol -- she has not taken Methotrexate and Humira in awhile as got burnt on them.  This regimen has offered some benefit.    COPD Continues to smoke, 1 PPD,  has been smoking since age 62. Continues on Stiolto daily and Albuterol as needed.  Had lung CT CA screening 11/12/22, these have noticed atherosclerosis and emphysema.   COPD status: stable Satisfied with current treatment?: yes Oxygen use: no Dyspnea frequency: no Cough frequency: occasional Rescue inhaler frequency: every other day Limitation of activity: no Productive cough: no Last Spirometry: 05/12/22 -- FEV1 66% and FEV1/FVC 99% Pneumovax: Up to Date Influenza: Up to Date  The 10-year ASCVD risk score (Arnett DK, et al., 2019) is: 6.3%   Values used to calculate the score:     Age: 77 years     Sex: Female     Is Non-Hispanic African American: No     Diabetic: No     Tobacco smoker: Yes     Systolic Blood Pressure: 123XX123 mmHg     Is BP treated: No     HDL Cholesterol: 55 mg/dL     Total Cholesterol: 198 mg/dL  FIBROMYALGIA Continues on Duloxetine.   Pain status: stable Satisfied with current treatment?: yes Previous pain specialty evaluation:  no Non-narcotic analgesic meds: yes Narcotic contract:no Treatments attempted: rest, APAP, ibuprofen and physical therapy    DEPRESSION/PTSD Continues Cymbalta and Klonopin (rarely takes).  In past took Vyvanse, Zyprexa, and Trazodone.  Previously saw [facility], has not seen psychiatry in some time now.  She reports no energy and sleep pattern.  Placed a referral to psychiatry in August 2023, but she did not attend.  She would like short refill on Klonopin until sees psychiatry.  Anniversary of losing mother coming up. Mood status: uncontrolled Satisfied with current treatment?: no Symptom severity: moderate Duration of current treatment : chronic Side effects: no Medication compliance: good compliance Psychotherapy/counseling: yes in past Depressed mood: yes Anxious mood: yes Anhedonia: no Significant weight loss or gain: no Insomnia: yes hard to stay asleep Fatigue: yes Feelings of worthlessness or guilt: no Impaired concentration/indecisiveness: yes Suicidal ideations: no Hopelessness: yes Crying spells: no    11/13/2022    9:32 AM 09/11/2022    2:57 PM 05/12/2022    2:41 PM 02/03/2022    3:02 PM 01/06/2022   10:07 AM  Depression screen PHQ 2/9  Decreased Interest '2 2 2 2 3  '$ Down, Depressed, Hopeless '2 2 2 3 3  '$ PHQ - 2 Score '4 4 4 5 6  '$ Altered sleeping '3 3 2 2 3  '$ Tired, decreased energy '3 2 3 3 '$ 3  Change in appetite '2 2 1 1 3  '$ Feeling bad or failure about yourself  2 0 '2 1 2  '$ Trouble concentrating '2 3 3 3 3  '$ Moving slowly or fidgety/restless '1 1 2 2 2  '$ Suicidal thoughts 0 0 0 0 0  PHQ-9 Score '17 15 17 17 22  '$ Difficult doing work/chores Very difficult Somewhat difficult Very difficult  Very difficult       11/13/2022    9:33 AM 09/11/2022    2:57 PM 05/12/2022    2:42 PM 02/03/2022    3:02 PM  GAD 7 : Generalized Anxiety Score  Nervous, Anxious, on Edge '2 2 2 3  '$ Control/stop worrying '2 2 2 2  '$ Worry too much - different things '2 2 2 3  '$ Trouble  relaxing '2 2 2 3  '$ Restless '1 1 2 3  '$ Easily annoyed or irritable '1 2 2 2  '$ Afraid - awful might happen 0 '1 1 1  '$ Total GAD 7 Score '10 12 13 17  '$ Anxiety Difficulty Somewhat difficult Somewhat difficult Very difficult Very difficult      07/10/2022    4:02 PM 07/24/2022   12:27 PM 08/02/2022   11:15 AM 09/11/2022    2:57 PM 11/13/2022    9:32 AM  Fall Risk  Falls in the past year?    0 0  Was there an injury with Fall?    0 0  Fall Risk Category Calculator    0 0  Fall Risk Category (Retired)    Low   (RETIRED) Patient Fall Risk Level Low fall risk Low fall risk Low fall risk Low fall risk   Patient at Risk for Falls Due to    No Fall Risks No Fall Risks  Fall risk Follow up    Falls evaluation completed Falls evaluation completed     Past Medical History:  Past Medical History:  Diagnosis Date   Anxiety    Arthritis    COPD (chronic obstructive pulmonary disease) (HCC)    Depression    Fibromyalgia    IBS (irritable bowel syndrome)    PTSD (post-traumatic stress disorder)     Surgical History:  Past Surgical History:  Procedure Laterality Date   ABDOMINAL HYSTERECTOMY     CARPAL TUNNEL RELEASE     KNEE SURGERY      Medications:  Current Outpatient Medications on File Prior to Visit  Medication Sig   acitretin (SORIATANE) 25 MG capsule Take 1 capsule by mouth daily.   Adalimumab (HUMIRA PEN) 40 MG/0.4ML PNKT Inject into the skin.   albuterol (VENTOLIN HFA) 108 (90 Base) MCG/ACT inhaler Inhale 2 puffs into the lungs every 6 (six) hours as needed for wheezing or shortness of breath.   clobetasol ointment (TEMOVATE) [id number] % Apply 1 application. topically 2 (two) times daily.   DULoxetine (CYMBALTA) 60 MG capsule Take 2 capsules (120 mg total) by mouth daily.   fluticasone (FLONASE) 50 MCG/ACT nasal spray Place 2 sprays into both nostrils daily.   folic acid (FOLVITE) 1 MG tablet Take 1 mg by mouth daily.   lactobacillus acidophilus & bulgar (LACTINEX) chewable tablet Chew 1  tablet by mouth 3 (three) times daily with meals.   meclizine (ANTIVERT) 12.5 MG tablet Take 1 tablet (12.5 mg total) by mouth 3 (three) times daily as needed for dizziness.   methotrexate 50 MG/2ML injection Inject 8 mLs into the skin once a week.   montelukast (SINGULAIR) 10 MG tablet Take 1 tablet (10 mg total)  by mouth at bedtime.   Multiple Vitamin (MULTIVITAMIN) tablet Take 1 tablet by mouth daily.   No current facility-administered medications on file prior to visit.    Allergies:  No Known Allergies  Social History:  Social History   Socioeconomic History   Marital status: Legally Separated    Spouse name: Not on file   Number of children: Not on file   Years of education: Not on file   Highest education level: Not on file  Occupational History   Not on file  Tobacco Use   Smoking status: Every Day    Packs/day: 1.00    Years: 41.00    Total pack years: 41.00    Types: Cigarettes   Smokeless tobacco: Never  Vaping Use   Vaping Use: Never used  Substance and Sexual Activity   Alcohol use: No   Drug use: No   Sexual activity: Not on file  Other Topics Concern   Not on file  Social History Narrative   Not on file   Social Determinants of Health   Financial Resource Strain: Low Risk  (11/13/2022)   Overall Financial Resource Strain (CARDIA)    Difficulty of Paying Living Expenses: Not hard at all  Food Insecurity: No Food Insecurity (11/13/2022)   Hunger Vital Sign    Worried About Running Out of Food in the Last Year: Never true    Ran Out of Food in the Last Year: Never true  Transportation Needs: No Transportation Needs (11/13/2022)   PRAPARE - Hydrologist (Medical): No    Lack of Transportation (Non-Medical): No  Physical Activity: Insufficiently Active (11/13/2022)   Exercise Vital Sign    Days of Exercise per Week: 2 days    Minutes of Exercise per Session: 20 min  Stress: Stress Concern Present (11/13/2022)   [place]    Feeling of Stress : Rather much  Social Connections: Socially Isolated (11/13/2022)   Social Connection and Isolation Panel [NHANES]    Frequency of Communication with Friends and Family: More than three times a week    Frequency of Social Gatherings with Friends and Family: More than three times a week    Attends Religious Services: Never    Marine scientist or Organizations: No    Attends Archivist Meetings: Never    Marital Status: Never married  Intimate Partner Violence: Not At Risk (11/13/2022)   Humiliation, Afraid, Rape, and Kick questionnaire    Fear of Current or Ex-Partner: No    Emotionally Abused: No    Physically Abused: No    Sexually Abused: No   Social History   Tobacco Use  Smoking Status Every Day   Packs/day: 1.00   Years: 41.00   Total pack years: 41.00   Types: Cigarettes  Smokeless Tobacco Never   Social History   Substance and Sexual Activity  Alcohol Use No    Family History:  Family History  Problem Relation Age of Onset   Breast cancer Mother 44    Past medical history, surgical history, medications, allergies, family history and social history reviewed with patient today and changes made to appropriate areas of the chart.   ROS  All other ROS negative except what is listed above and in the HPI.      Objective:    BP 122/80   Pulse 67   Temp 97.8 F (36.6 C) (Oral)   Ht 5'  4.02" (1.626 m)   Wt 134 lb 4.8 oz (60.9 kg)   SpO2 98%   BMI 23.04 kg/m   Wt Readings from Last 3 Encounters:  11/13/22 134 lb 4.8 oz (60.9 kg)  09/11/22 139 lb 12.8 oz (63.4 kg)  08/02/22 134 lb (60.8 kg)    Physical Exam Vitals and nursing note reviewed. Exam conducted with a chaperone present.  Constitutional:      General: She is awake. She is not in acute distress.    Appearance: She is well-developed and well-groomed. She is not ill-appearing or toxic-appearing.   HENT:     Head: Normocephalic and atraumatic.     Right Ear: Hearing, tympanic membrane, ear canal and external ear normal. No drainage.     Left Ear: Hearing, tympanic membrane, ear canal and external ear normal. No drainage.     Nose: Nose normal.     Right Sinus: No maxillary sinus tenderness or frontal sinus tenderness.     Left Sinus: No maxillary sinus tenderness or frontal sinus tenderness.     Mouth/Throat:     Mouth: Mucous membranes are moist.     Pharynx: Oropharynx is clear. Uvula midline. No pharyngeal swelling, oropharyngeal exudate or posterior oropharyngeal erythema.  Eyes:     General: Lids are normal.        Right eye: No discharge.        Left eye: No discharge.     Extraocular Movements: Extraocular movements intact.     Conjunctiva/sclera: Conjunctivae normal.     Pupils: Pupils are equal, round, and reactive to light.     Visual Fields: Right eye visual fields normal and left eye visual fields normal.  Neck:     Thyroid: No thyromegaly.     Vascular: No carotid bruit.     Trachea: Trachea normal.  Cardiovascular:     Rate and Rhythm: Normal rate and regular rhythm.     Heart sounds: Normal heart sounds. No murmur heard.    No gallop.  Pulmonary:     Effort: Pulmonary effort is normal. No accessory muscle usage or respiratory distress.     Breath sounds: Normal breath sounds.  Chest:  Breasts:    Right: Normal.     Left: Normal.  Abdominal:     General: Bowel sounds are normal.     Palpations: Abdomen is soft. There is no hepatomegaly or splenomegaly.     Tenderness: There is no abdominal tenderness.  Musculoskeletal:        General: Normal range of motion.     Cervical back: Normal range of motion and neck supple.     Right lower leg: No edema.     Left lower leg: No edema.  Lymphadenopathy:     Head:     Right side of head: No submental, submandibular, tonsillar, preauricular or posterior auricular adenopathy.     Left side of head: No submental,  submandibular, tonsillar, preauricular or posterior auricular adenopathy.     Cervical: No cervical adenopathy.     Upper Body:     Right upper body: No supraclavicular, axillary or pectoral adenopathy.     Left upper body: No supraclavicular, axillary or pectoral adenopathy.  Skin:    General: Skin is warm and dry.     Capillary Refill: Capillary refill takes less than 2 seconds.     Findings: No rash.  Neurological:     Mental Status: She is alert and oriented to person, place, and time.  Gait: Gait is intact.     Deep Tendon Reflexes: Reflexes are normal and symmetric.     Reflex Scores:      Brachioradialis reflexes are 2+ on the right side and 2+ on the left side.      Patellar reflexes are 2+ on the right side and 2+ on the left side. Psychiatric:        Attention and Perception: Attention normal.        Mood and Affect: Mood normal.        Speech: Speech normal.        Behavior: Behavior normal. Behavior is cooperative.        Thought Content: Thought content normal.        Judgment: Judgment normal.       11/13/2022   12:29 PM  6CIT Screen  What Year? 0 points  What month? 0 points  What time? 0 points  Count back from 20 0 points  Months in reverse 0 points  Repeat phrase 0 points  Total Score 0 points   Results for orders placed or performed in visit on 09/11/22  Rapid Strep screen([facility])   Specimen: Other   Other  Result Value Ref Range   Strep Gp A Ag, IA W/Reflex Negative Negative  Novel Coronavirus, NAA ([facility])   Specimen: Nasopharyngeal(NP) swabs in vial transport medium  Result Value Ref Range   SARS-CoV-2, NAA Not Detected Not Detected  Culture, Group A Strep   Other  Result Value Ref Range   Strep A Culture Negative   Veritor Flu A/B Waived  Result Value Ref Range   Influenza A Negative Negative   Influenza B Negative Negative      Assessment & Plan:   Problem List Items Addressed This Visit       Cardiovascular and  Mediastinum   Aortic atherosclerosis (HCC)    Chronic, ongoing.  Discussed at length with patient and recommended complete cessation of smoking and educated patient on risk with smoking. Patient refuses at this time. Consider addition of statin and ASA in future.      Relevant Orders   Comprehensive metabolic panel   Lipid Panel w/o Chol/HDL Ratio     Respiratory   Centrilobular emphysema (HCC)    Chronic, ongoing. Spirometry August 2023 FEV1 66%, FEV1/FVC 99%, similar to previous levels in 2020.   Will trial a change from Stiolto to [place] for triple therapy which may offer more symptoms relief and continue to decrease exacerbations.  Continue PRN Albuterol.  Recommend complete cessation smoking.  Continue annual CT screening.  Continue Singulair to assist with exacerbations caused by allergies, discussed BLACK BOX warning with her and she is aware to stop if any SI presents.  Return in 6 weeks for follow-up.      Relevant Medications   Budeson-Glycopyrrol-Formoterol (BREZTRI AEROSPHERE) 160-9-4.8 MCG/ACT AERO   Other Relevant Orders   CBC with Differential/Platelet     Nervous and Auditory   Nicotine dependence, cigarettes, w unsp disorders    I have recommended complete cessation of tobacco use. I have discussed various options available for assistance with tobacco cessation including over the counter methods (Nicotine gum, patch and lozenges). We also discussed prescription options (Chantix, Nicotine Inhaler / Nasal Spray). The patient is not interested in pursuing any prescription tobacco cessation options at this time.  She is interested in Chantix, but I have recommended we get her back into psychiatry first as concern this will exacerbate her anxiety levels.  Musculoskeletal and Integument   Psoriatic arthritis (HCC)    Chronic, ongoing.  Continue collaboration with rheumatology & dermatology at [facility] and current medication regimen.  Reviewed recent notes.  Will obtain CMP  today.  Referral to local rheumatology and dermatology due to insurance changes.  Will also place ortho referral due to changes.      Relevant Orders   Ambulatory referral to Rheumatology   Ambulatory referral to Dermatology     Other   Elevated low density lipoprotein (LDL) cholesterol level    Noted past labs, recommend starting low dose Crestor.  Refuses at this time.  Lipid panel today and continue to recommend. The 10-year ASCVD risk score (Arnett DK, et al., 2019) is: 6.3%   Values used to calculate the score:     Age: 54 years     Sex: Female     Is Non-Hispanic African American: No     Diabetic: No     Tobacco smoker: Yes     Systolic Blood Pressure: 123XX123 mmHg     Is BP treated: No     HDL Cholesterol: 55 mg/dL     Total Cholesterol: 198 mg/dL       Relevant Orders   Comprehensive metabolic panel   Lipid Panel w/o Chol/HDL Ratio   Family history of breast cancer in mother    Up to Date on mammogram at this time, continue yearly.      Fibromyalgia    Chronic, ongoing.  Continue Duloxetine for mood and pain, adjust medications as needed.        Relevant Medications   clonazePAM (KLONOPIN) 0.5 MG tablet   Other Relevant Orders   Ambulatory referral to Rheumatology   Ambulatory referral to Orthopedic Surgery   PTSD (post-traumatic stress disorder)    Refer to depression plan of care.      Relevant Orders   TSH   Ambulatory referral to Psychiatry   Severe recurrent major depression without psychotic features (HCC)    Chronic, ongoing. Denies SI/HI.  Continue Duloxetine at this time and will send in short period of Klonopin refill until we can get her back into psychiatry, discussed with patient.  New referral to psychiatry placed today.  Would benefit return to them.      Relevant Orders   TSH   Ambulatory referral to Psychiatry   Vitamin D deficiency    Chronic, ongoing, recommend she take 2000 units Vitamin D3 daily and check level today.      Relevant  Orders   VITAMIN D 25 Hydroxy (Vit-D Deficiency, Fractures)   Other Visit Diagnoses     Medicare annual wellness visit, subsequent    -  Primary   Medicare wellness due and performed with patient today.   Colon cancer screening       GI referral placed.   Relevant Orders   Ambulatory referral to Gastroenterology   Encounter for annual physical exam       Annual physical today with labs and health maintenance reviewed, discussed with patient.        Follow up plan: Return in about 6 weeks (around 12/25/2022) for COPD -- added on Breztri.   LABORATORY TESTING:  - Pap smear:  Up To Date  IMMUNIZATIONS:   - Tdap: Tetanus vaccination status reviewed: last tetanus booster within 10 years. - Influenza: Not Up to date - refuses - Pneumovax: Up to date - Prevnar: at age 77 next - COVID: Refused - HPV: Not applicable - Shingrix vaccine:  Refused - Lung CA Screening - Up To Date  SCREENING: -Mammogram: Up To Date June 2023 - Colonoscopy: Ordered Today - Bone Density: Not applicable  -Hearing Test: Not applicable  -Spirometry: Up To Date  PATIENT COUNSELING:    Advised to avoid cigarette smoking.  I discussed with the patient that most people either abstain from alcohol or drink within safe limits (<=14/week and <=4 drinks/occasion for males, <=7/weeks and <= 3 drinks/occasion for females) and that the risk for alcohol disorders and other health effects rises proportionally with the number of drinks per week and how often a drinker exceeds daily limits.  Discussed cessation/primary prevention of drug use and availability of treatment for abuse.   Diet: Encouraged to adjust caloric intake to maintain  or achieve ideal body weight, to reduce intake of dietary saturated fat and total fat, to limit sodium intake by avoiding high sodium foods and not adding table salt, and to maintain adequate dietary potassium and calcium preferably from fresh fruits, vegetables, and low-fat dairy  products.    Stressed the importance of regular exercise  Injury prevention: Discussed safety belts, safety helmets, smoke detector, smoking near bedding or upholstery.   Dental health: Discussed importance of regular tooth brushing, flossing, and dental visits.    NEXT PREVENTATIVE PHYSICAL DUE IN 1 YEAR. Return in about 6 weeks (around 12/25/2022) for COPD -- added on Breztri.

## 2022-11-13 NOTE — Assessment & Plan Note (Signed)
Chronic, ongoing.  Continue Duloxetine for mood and pain, adjust medications as needed.

## 2022-11-13 NOTE — Assessment & Plan Note (Signed)
Chronic, ongoing. Spirometry August 2023 FEV1 66%, FEV1/FVC 99%, similar to previous levels in 2020.   Will trial a change from Stiolto to [place] for triple therapy which may offer more symptoms relief and continue to decrease exacerbations.  Continue PRN Albuterol.  Recommend complete cessation smoking.  Continue annual CT screening.  Continue Singulair to assist with exacerbations caused by allergies, discussed BLACK BOX warning with her and she is aware to stop if any SI presents.  Return in 6 weeks for follow-up.

## 2022-11-13 NOTE — Assessment & Plan Note (Signed)
Chronic, ongoing. Denies SI/HI.  Continue Duloxetine at this time and will send in short period of Klonopin refill until we can get her back into psychiatry, discussed with patient.  New referral to psychiatry placed today.  Would benefit return to them.

## 2022-11-13 NOTE — Assessment & Plan Note (Signed)
Refer to depression plan of care.

## 2022-11-13 NOTE — Assessment & Plan Note (Signed)
Up to Date on mammogram at this time, continue yearly.

## 2022-11-13 NOTE — Assessment & Plan Note (Signed)
I have recommended complete cessation of tobacco use. I have discussed various options available for assistance with tobacco cessation including over the counter methods (Nicotine gum, patch and lozenges). We also discussed prescription options (Chantix, Nicotine Inhaler / Nasal Spray). The patient is not interested in pursuing any prescription tobacco cessation options at this time.  She is interested in Chantix, but I have recommended we get her back into psychiatry first as concern this will exacerbate her anxiety levels.

## 2022-11-13 NOTE — Assessment & Plan Note (Signed)
Chronic, ongoing.  Discussed at length with patient and recommended complete cessation of smoking and educated patient on risk with smoking. Patient refuses at this time. Consider addition of statin and ASA in future.

## 2022-11-13 NOTE — Telephone Encounter (Signed)
Medication Refill - Medication: clonazePAM (KLONOPIN) 0.5 MG tablet   Has the patient contacted their pharmacy? Yes.   Per pharmacy medication was not called in by PCP.   Preferred Pharmacy (with phone number or street name):  [id number] [place]: [phone] Fax: [phone]    Has the patient been seen for an appointment in the last year OR does the patient have an upcoming appointment? Yes.    Agent: Please be advised that RX refills may take up to 3 business days. We ask that you follow-up with your pharmacy.

## 2022-11-13 NOTE — Assessment & Plan Note (Signed)
Noted past labs, recommend starting low dose Crestor.  Refuses at this time.  Lipid panel today and continue to recommend. The 10-year ASCVD risk score (Arnett DK, et al., 2019) is: 6.3%   Values used to calculate the score:     Age: 62 years     Sex: Female     Is Non-Hispanic African American: No     Diabetic: No     Tobacco smoker: Yes     Systolic Blood Pressure: 123XX123 mmHg     Is BP treated: No     HDL Cholesterol: 55 mg/dL     Total Cholesterol: 198 mg/dL

## 2022-11-13 NOTE — Assessment & Plan Note (Signed)
Chronic, ongoing, recommend she take 2000 units Vitamin D3 daily and check level today.

## 2022-11-13 NOTE — Telephone Encounter (Signed)
Pt called to check status of request, seeking refill today

## 2022-11-14 LAB — CBC WITH DIFFERENTIAL/PLATELET
Basophils Absolute: 0.1 10*3/uL (ref 0.0–0.2)
Basos: 1 %
EOS (ABSOLUTE): 0.1 10*3/uL (ref 0.0–0.4)
Eos: 3 %
Hematocrit: 44 % (ref 34.0–46.6)
Hemoglobin: 14.5 g/dL (ref 11.1–15.9)
Immature Grans (Abs): 0 10*3/uL (ref 0.0–0.1)
Immature Granulocytes: 0 %
Lymphocytes Absolute: 1.9 10*3/uL (ref 0.7–3.1)
Lymphs: 44 %
MCH: 31.2 pg (ref 26.6–33.0)
MCHC: 33 g/dL (ref 31.5–35.7)
MCV: 95 fL (ref 79–97)
Monocytes Absolute: 0.4 10*3/uL (ref 0.1–0.9)
Monocytes: 9 %
Neutrophils Absolute: 1.8 10*3/uL (ref 1.4–7.0)
Neutrophils: 43 %
Platelets: 226 10*3/uL (ref 150–450)
RBC: 4.65 x10E6/uL (ref 3.77–5.28)
RDW: 13 % (ref 11.7–15.4)
WBC: 4.3 10*3/uL (ref 3.4–10.8)

## 2022-11-14 LAB — VITAMIN D 25 HYDROXY (VIT D DEFICIENCY, FRACTURES): Vit D, 25-Hydroxy: 20.2 ng/mL — ABNORMAL LOW (ref 30.0–100.0)

## 2022-11-14 LAB — LIPID PANEL W/O CHOL/HDL RATIO
Cholesterol, Total: 224 mg/dL — ABNORMAL HIGH (ref 100–199)
HDL: 50 mg/dL (ref >39)
LDL Chol Calc (NIH): 147 mg/dL — ABNORMAL HIGH (ref 0–99)
Triglycerides: 151 mg/dL — ABNORMAL HIGH (ref 0–149)
VLDL Cholesterol Cal: 27 mg/dL (ref 5–40)

## 2022-11-14 LAB — COMPREHENSIVE METABOLIC PANEL
ALT: 13 IU/L (ref 0–32)
AST: 15 IU/L (ref 0–40)
Albumin/Globulin Ratio: 2 (ref 1.2–2.2)
Albumin: 4.1 g/dL (ref 3.8–4.9)
Alkaline Phosphatase: 106 IU/L (ref 44–121)
BUN/Creatinine Ratio: 10 — ABNORMAL LOW (ref 12–28)
BUN: 8 mg/dL (ref 8–27)
Bilirubin Total: <0.2 mg/dL (ref 0.0–1.2)
CO2: 25 mmol/L (ref 20–29)
Calcium: 9.5 mg/dL (ref 8.7–10.3)
Chloride: 105 mmol/L (ref 96–106)
Creatinine, Ser: 0.84 mg/dL (ref 0.57–1.00)
Globulin, Total: 2.1 g/dL (ref 1.5–4.5)
Glucose: 76 mg/dL (ref 70–99)
Potassium: 3.7 mmol/L (ref 3.5–5.2)
Sodium: 145 mmol/L — ABNORMAL HIGH (ref 134–144)
Total Protein: 6.2 g/dL (ref 6.0–8.5)
eGFR: 80 mL/min/{1.73_m2} (ref >59)

## 2022-11-14 LAB — TSH: TSH: 1.27 u[IU]/mL (ref 0.450–4.500)

## 2022-11-15 ENCOUNTER — Other Ambulatory Visit: Payer: Self-pay | Admitting: Nurse Practitioner

## 2022-11-15 MED ORDER — ROSUVASTATIN CALCIUM 10 MG PO TABS: 10.0000 mg | ORAL_TABLET | Freq: Every day | ORAL | 6 refills | Status: DC

## 2022-11-15 MED ORDER — CHOLECALCIFEROL 1.25 MG (50000 UT) PO TABS: 1.0000 | ORAL_TABLET | ORAL | 4 refills | Status: DC

## 2022-11-15 NOTE — Progress Notes (Signed)
Contacted via MyChart The 10-year ASCVD risk score (Arnett DK, et al., 2019) is: 7.3%   Values used to calculate the score:     Age: 62 years     Sex: Female     Is Non-Hispanic African American: No     Diabetic: No     Tobacco smoker: Yes     Systolic Blood Pressure: 123XX123 mmHg     Is BP treated: No     HDL Cholesterol: 50 mg/dL     Total Cholesterol: 224 mg/dL   Good morning Emily Johnston, your labs have returned: - Kidney function, creatinine and eGFR, remains normal, as is liver function, AST and ALT.  - Sodium level a little elevated, please increase water intake and reduce salt intake. - Vitamin D remains on low side, I am going to send in a higher weekly dose to take to help increase these levels.  This can help mental health as well. - CBC shows no anemia or infection. - TSH, thyroid, is normal. - Cholesterol levels are elevated, I am going to send in a low dose of Rosuvastatin for you to start taking to help lower these levels and prevent stroke/heart attack.  This is important.  If any issues with medication let me know and we will plan to recheck levels next visit with you fasting.  Any questions? Keep being awesome!!  Thank you for allowing me to participate in your care.  I appreciate you. Kindest regards, Joci Dress

## 2022-11-23 ENCOUNTER — Encounter: Payer: Self-pay | Admitting: Radiology

## 2022-11-23 ENCOUNTER — Emergency Department
Admission: EM | Admit: 2022-11-23 | Discharge: 2022-11-23 | Disposition: A | Discharge status: Home / Self Care | Payer: 59 | Attending: Emergency Medicine | Admitting: Emergency Medicine

## 2022-11-23 ENCOUNTER — Other Ambulatory Visit: Payer: Self-pay

## 2022-11-23 DIAGNOSIS — L409 Psoriasis, unspecified: Secondary | ICD-10-CM | POA: Insufficient documentation

## 2022-11-23 DIAGNOSIS — J449 Chronic obstructive pulmonary disease, unspecified: Secondary | ICD-10-CM | POA: Insufficient documentation

## 2022-11-23 HISTORY — DX: Psoriasis, unspecified: L40.9

## 2022-11-23 MED ORDER — PREDNISONE 10 MG PO TABS: 10.0000 mg | ORAL_TABLET | Freq: Every day | ORAL | 0 refills | Status: DC

## 2022-11-23 MED ORDER — PREDNISONE 20 MG PO TABS
10.0000 mg | ORAL_TABLET | Freq: Once | ORAL | Status: AC
  Administered 2022-11-23: 10 mg via ORAL
  Filled 2022-11-23: qty 1

## 2022-11-23 MED ORDER — HYDROCORTISONE 1 % EX OINT: 1.0000 | TOPICAL_OINTMENT | Freq: Two times a day (BID) | CUTANEOUS | 0 refills | Status: DC

## 2022-11-23 NOTE — Discharge Instructions (Addendum)
Please begin taking your prednisone tomorrow 11/24/2022.  You may use your 1% hydrocortisone ointment on the face twice daily for the next 5 days.  Do not use longer than 5 days to prevent skin bleaching.  Please follow-up with your dermatologist this week.  Return to the emergency department for any symptom concerning to yourself.

## 2022-11-23 NOTE — ED Provider Notes (Signed)
[facility] Provider Note    Event Date/Time   First MD Initiated Contact with Patient 11/23/22 1536     (approximate)  History   Chief Complaint: Psoriasis  HPI  Emily Johnston is a 62 y.o. female with a past medical history anxiety, COPD, fibromyalgia, psoriasis, presents to the emergency department for interruption of psoriasis.  According to the patient over the last 3 days or so she has had an eruption of psoriasis over her face which she has had in the past.  Patient states she was on Humira for her psoriasis but discontinued this approximately 6 months ago.  Patient states she follows up with dermatology in [facility] however as this occurred over the weekend she has not been able to reach anybody at the office but will attempt to reach them tomorrow.  Patient denies any fever.  Denies any oral mucosal involvement.  Physical Exam   Triage Vital Signs: ED Triage Vitals  Enc Vitals Group     BP 11/23/22 1513 (!) 144/98     Pulse Rate 11/23/22 1513 86     Resp 11/23/22 1513 (!) 22     Temp 11/23/22 1513 98.2 F (36.8 C)     Temp Source 11/23/22 1513 Oral     SpO2 11/23/22 1513 95 %     Weight 11/23/22 1517 134 lb (60.8 kg)     Height 11/23/22 1517 '5\' 4"'$  (1.626 m)     Head Circumference --      Peak Flow --      Pain Score --      Pain Loc --      Pain Edu? --      Excl. in [place]? --     Most recent vital signs: Vitals:   11/23/22 1513  BP: (!) 144/98  Pulse: 86  Resp: (!) 22  Temp: 98.2 F (36.8 C)  SpO2: 95%    General: Awake, no distress.  CV:  Good peripheral perfusion.  Regular rate and rhythm  Resp:  Normal effort.  Equal breath sounds bilaterally.  Abd:  No distention.  Soft, nontender.  No rebound or guarding. Other:  Patient does have a psoriasis consistent rash to bilateral aspects of the face.  No eye involvement, no oral mucosal involvement.  Appears erythematous.   ED Results / Procedures / Treatments    MEDICATIONS ORDERED IN ED: Medications - No data to display   IMPRESSION / MDM / [place] / ED COURSE  I reviewed the triage vital signs and the nursing notes.  Patient's presentation is most consistent with acute presentation with potential threat to life or bodily function.  Patient presents emergency department for psoriasis outbreak to her face.  States that has occurred over the past 3 days.  Follows up with [facility] dermatology will follow-up with them tomorrow.  Exam here is consistent with likely psoriasis rash.  No concerning findings on my physical exam.  Will prescribe a 1% hydrocortisone cream for the patient to use to her face for no more than 5 days to prevent skin bleaching.  We will also prescribe prednisone taper for the patient.  She will follow-up with her dermatologist tomorrow.  Patient is agreeable to this plan of care.  FINAL CLINICAL IMPRESSION(S) / ED DIAGNOSES   Psoriasis  Rx / DC Orders   Prednisone, 1% hydrocortisone   Note:  This document was prepared using Dragon voice recognition software and may include unintentional dictation errors.  Har[place] Dark, MD 11/23/22 1615

## 2022-11-23 NOTE — ED Triage Notes (Signed)
Pt states her psoriasis flared up yesterday and she is in severe pain. Pt states she stopped taking her humira several months ago due to not wanting to take injections anymore.

## 2022-11-24 ENCOUNTER — Ambulatory Visit: Payer: Self-pay | Admitting: *Deleted

## 2022-11-24 ENCOUNTER — Encounter: Payer: Self-pay | Admitting: Nurse Practitioner

## 2022-11-24 ENCOUNTER — Telehealth: Payer: Self-pay

## 2022-11-24 DIAGNOSIS — R0981 Nasal congestion: Secondary | ICD-10-CM

## 2022-11-24 MED ORDER — MONTELUKAST SODIUM 10 MG PO TABS: 10.0000 mg | ORAL_TABLET | Freq: Every day | ORAL | 4 refills | Status: AC

## 2022-11-24 MED ORDER — AMOXICILLIN-POT CLAVULANATE 875-125 MG PO TABS: 1.0000 | ORAL_TABLET | Freq: Two times a day (BID) | ORAL | 0 refills | Status: DC

## 2022-11-24 MED ORDER — CEPHALEXIN 500 MG PO CAPS: 500.0000 mg | ORAL_CAPSULE | Freq: Two times a day (BID) | ORAL | 0 refills | Status: DC

## 2022-11-24 MED ORDER — FLUTICASONE PROPIONATE 50 MCG/ACT NA SUSP: 2.0000 | Freq: Every day | NASAL | 1 refills | Status: DC

## 2022-11-24 MED ORDER — CEPHALEXIN 500 MG PO CAPS: 500.0000 mg | ORAL_CAPSULE | Freq: Two times a day (BID) | ORAL | 0 refills | Status: AC

## 2022-11-24 NOTE — Transitions of Care (Post Inpatient/ED Visit) (Unsigned)
   11/24/2022  Name: Emily Johnston MRN: [id number] DOB: 08/09/1961  {AMBTOCFU:[id number]}

## 2022-11-24 NOTE — Telephone Encounter (Signed)
Noted  

## 2022-11-24 NOTE — Telephone Encounter (Signed)
Reason for Disposition  [1] Using nasal washes and pain medicine > 24 hours AND [2] sinus pain (around cheekbone or eye) persists  Answer Assessment - Initial Assessment Questions 1. APPEARANCE of RASH: "Describe the rash."      Psoriasis- patient was seen at ED yesterday- given steriod and advised call dermatology   2. LOCATION: "Where is the rash located?"      face Patient is to follow up with dermatology  Answer Assessment - Initial Assessment Questions 1. LOCATION: "Where does it hurt?"      Nasal congestion 2. ONSET: "When did the sinus pain start?"  (e.g., hours, days)      Today worse 3. SEVERITY: "How bad is the pain?"   (Scale 1-10; mild, moderate or severe)   - MILD (1-3): doesn't interfere with normal activities    - MODERATE (4-7): interferes with normal activities (e.g., work or school) or awakens from sleep   - SEVERE (8-10): excruciating pain and patient unable to do any normal activities        headache 4. RECURRENT SYMPTOM: "Have you ever had sinus problems before?" If Yes, ask: "When was the last time?" and "What happened that time?"      Hx sinus infection- with last 6 months 5. NASAL CONGESTION: "Is the nose blocked?" If Yes, ask: "Can you open it or must you breathe through your mouth?"     Nose blocked- breathing through mouth 6. NASAL DISCHARGE: "Do you have discharge from your nose?" If so ask, "What color?"     Hard to get congestion out- not moving 7. FEVER: "Do you have a fever?" If Yes, ask: "What is it, how was it measured, and when did it start?"      no 8. OTHER SYMPTOMS: "Do you have any other symptoms?" (e.g., sore throat, cough, earache, difficulty breathing)     Sneezing, facial pain  Protocols used: Rash or Redness - Localized-A-AH, Sinus Pain or Congestion-A-AH

## 2022-11-24 NOTE — Telephone Encounter (Signed)
Diu-Ha, RPH from [place] called and says they received Augmentin and Keflex on this patient and needs to clarify which one to refill. Advised Keflex is the correct antibiotic, Augmentin was sent initially and discontinued by provider. She verbalized understanding.

## 2022-11-24 NOTE — Telephone Encounter (Signed)
  Chief Complaint: sinus congestion, pain- medication request Symptoms: nasal congestion, sinus pain, headache Frequency: started few days ago- today is worse. Pertinent Negatives: Patient denies fever Disposition: [] ED /[] Urgent Care (no appt availability in office) / [] Appointment(In office/virtual)/ []  [facility]/ [] Home Care/ [] Refused Recommended Disposition /[] [place] [facility]/ [x]  Follow-up with PCP Additional Notes: Patient states she was just in office last week and wants to know if an antibiotic can be sent in for her sinuses. Patient had to go to ED yesterday for a psoriasis flare on face- she has been given prednisone for that. Patient states she is moving and it is hard for her to come in office now. Will send request.   [place]

## 2022-11-24 NOTE — Telephone Encounter (Signed)
This encounter was created in error - please disregard.

## 2022-11-26 NOTE — Transitions of Care (Post Inpatient/ED Visit) (Signed)
   11/26/2022  Name: Emily Johnston MRN: [id number] DOB: 1961-09-05  Today's TOC FU Call Status: Today's TOC FU Call Status:: Unsuccessful Call (2nd Attempt) Unsuccessful Call (1st Attempt) Date: 11/24/22 Unsuccessful Call (2nd Attempt) Date: 11/26/22  Attempted to reach the patient regarding the most recent Inpatient/ED visit.  Follow Up Plan: Additional outreach attempts will be made to reach the patient to complete the Transitions of Care (Post Inpatient/ED visit) call.   [employer], [facility]

## 2022-11-26 NOTE — Transitions of Care (Post Inpatient/ED Visit) (Signed)
   11/26/2022  Name: Emily Johnston MRN: [id number] DOB: 10-Dec-1960  Today's TOC FU Call Status: Today's TOC FU Call Status:: Unsuccessful Call (3rd Attempt) Unsuccessful Call (1st Attempt) Date: 11/24/22 Unsuccessful Call (2nd Attempt) Date: 11/26/22 Unsuccessful Call (3rd Attempt) Date: 11/26/22  Attempted to reach the patient regarding the most recent Inpatient/ED visit.  Follow Up Plan: No further outreach attempts will be made at this time. We have been unable to contact the patient.  [place], CMA, [facility]

## 2022-11-28 ENCOUNTER — Telehealth: Payer: Self-pay | Admitting: Acute Care

## 2022-11-28 NOTE — Telephone Encounter (Signed)
I have attempted to call the patient with the results of their  Low Dose CT Chest Lung cancer screening scan. There was no answer. The mailbox on this phone is full, and there was no option to leave a message.We may need to send a letter to this patient Langley Gauss, patient has a GG nodule that has grown from 2-11.4 mm in the last 4 years. While the scan was read as a LR 2, Dr. Darnell Level would like to see the patient in the office and discuss Nodify Lab work to more closely assess the area of concern.   We can get her scheduled once we speak with her .We will need to get follow up imaging interval from Dr. Darnell Level after she reads the nodify results Thanks so much

## 2022-12-01 ENCOUNTER — Telehealth: Payer: Self-pay | Admitting: Nurse Practitioner

## 2022-12-01 NOTE — Telephone Encounter (Signed)
Copied from [place] [phone]. Topic: Referral - Status >> Dec 01, 2022  2:03 PM Chapman Fitch wrote: Reason for CRM:  Pt seen [facility] last week and she needs a coy of her most recent labs sent to them by email so they can call in a Prescription for pt/ please email results to [email or web address]/ the providers name is Threasa Beards Adams/ please advise

## 2022-12-01 NOTE — Telephone Encounter (Signed)
Recent labs have been faxed to [facility] attn; Raynelle Jan

## 2022-12-03 NOTE — Telephone Encounter (Signed)
Letter mailed to home requesting patient call for review of LDCT results

## 2022-12-15 ENCOUNTER — Other Ambulatory Visit: Payer: Self-pay | Admitting: Nurse Practitioner

## 2022-12-15 ENCOUNTER — Encounter: Payer: Self-pay | Admitting: Nurse Practitioner

## 2022-12-15 MED ORDER — CLONAZEPAM 0.5 MG PO TABS: 0.5000 mg | ORAL_TABLET | Freq: Two times a day (BID) | ORAL | 0 refills | Status: DC | PRN

## 2022-12-15 NOTE — Telephone Encounter (Signed)
I have tried to contact the patient. I did not get an answer and her voicemail is full so I could not leave a message. I will try again later.

## 2022-12-15 NOTE — Telephone Encounter (Signed)
Pt called the office and I scheduled her for an appt with Dr. Darnell Level. Nothing further needed.

## 2022-12-15 NOTE — Telephone Encounter (Signed)
Spoke with pt and advised of lung screening CT results per Eric Form, NP. Pt is in agreement to see Dr Patsey Berthold to discuss Nodify bloodwork. I explained to pt that she would get a call from the [place] office to ger her scheduled. Pt verbalized understanding. Will await results from nodify labs to decide on CT follow up.

## 2022-12-16 ENCOUNTER — Other Ambulatory Visit: Payer: Self-pay | Admitting: Nurse Practitioner

## 2022-12-16 DIAGNOSIS — R0981 Nasal congestion: Secondary | ICD-10-CM

## 2022-12-16 NOTE — Telephone Encounter (Signed)
Requested medication (s) are due for refill today: last refill 11/24/22  Requested medication (s) are on the active medication list: yes  Last refill:  11/24/22 #15.8 ml 1 refill  Future visit scheduled: yes in 1 week  Notes to clinic:  Pharmacy comment: [place]. DX Code Needed.      Requested Prescriptions  Pending Prescriptions Disp Refills   fluticasone (FLONASE) 50 MCG/ACT nasal spray [Pharmacy Med Name: FLUTICASONE PROP 50 MCG SPRAY] 48 mL 1    Sig: SPRAY 2 SPRAYS INTO EACH NOSTRIL EVERY DAY     Ear, Nose, and Throat: Nasal Preparations - Corticosteroids Passed - 12/16/2022  9:31 AM      Passed - Valid encounter within last 12 months    Recent Outpatient Visits           1 month ago Medicare annual wellness visit, subsequent   [place], [place] T, NP   3 months ago Acute bacterial rhinosinusitis   [place], PA-C   7 months ago Centrilobular emphysema ([place])   [place], [place] T, NP   10 months ago Severe recurrent major depression without psychotic features ([place])   [place] Cannady, Henrine Screws T, NP   11 months ago Centrilobular emphysema ([place])   [place], Barbaraann Faster, NP       Future Appointments             In 1 week Cannady, Barbaraann Faster, NP [place], [facility]

## 2022-12-21 NOTE — Patient Instructions (Signed)
COPD and Physical Activity ?Chronic obstructive pulmonary disease (COPD) is a long-term, or chronic, condition that affects the lungs. COPD is a general term that can be used to describe many problems that cause inflammation of the lungs and limit airflow. These conditions include chronic bronchitis and emphysema. ?The main symptom of COPD is shortness of breath, which makes it harder to do even simple tasks. This can also make it harder to exercise and stay active. Talk with your health care provider about treatments to help you breathe better and actions you can take to prevent breathing problems during physical activity. ?What are the benefits of exercising when you have COPD? ?Exercising regularly is an important part of a healthy lifestyle. You can still exercise and do physical activities even though you have COPD. Exercise and physical activity improve your shortness of breath by increasing blood flow (circulation). This causes your heart to pump more oxygen through your body. Moderate exercise can: ?Improve oxygen use. ?Increase your energy level. ?Help with shortness of breath. ?Strengthen your breathing muscles. ?Improve heart health. ?Help with sleep. ?Improve your self-esteem and feelings of self-worth. ?Lower depression, stress, and anxiety. ?Exercise can benefit everyone with COPD. The severity of your disease may affect how hard you can exercise, especially at first, but everyone can benefit. Talk with your health care provider about how much exercise is safe for you, and which activities and exercises are safe for you. ?What actions can I take to prevent breathing problems during physical activity? ?Sign up for a pulmonary rehabilitation program. This type of program may include: ?Education about lung diseases. ?Exercise classes that teach you how to exercise and be more active while improving your breathing. This usually involves: ?Exercise using your lower extremities, such as a stationary  bicycle. ?About 30 minutes of exercise, 2 to 5 times per week, for 6 to 12 weeks. ?Strength training, such as push-ups or leg lifts. ?Nutrition education. ?Group classes in which you can talk with others who also have COPD and learn ways to manage stress. ?If you use an oxygen tank, you should use it while you exercise. Work with your health care provider to adjust your oxygen for your physical activity. Your resting flow rate is different from your flow rate during physical activity. ?How to manage your breathing while exercising ?While you are exercising: ?Take slow breaths. ?Pace yourself, and do nottry to go too fast. ?Purse your lips while breathing out. Pursing your lips is similar to a kissing or whistling position. ?If doing exercise that uses a quick burst of effort, such as weight lifting: ?Breathe in before starting the exercise. ?Breathe out during the hardest part of the exercise, such as raising the weights. ?Where to find support ?You can find support for exercising with COPD from: ?Your health care provider. ?A pulmonary rehabilitation program. ?Your local health department or community health programs. ?Support groups, either online or in-person. Your health care provider may be able to recommend support groups. ?Where to find more information ?You can find more information about exercising with COPD from: ?American Lung Association: lung.org ?COPD Foundation: copdfoundation.org ?Contact a health care provider if: ?Your symptoms get worse. ?You have nausea. ?You have a fever. ?You want to start a new exercise program or a new activity. ?Get help right away if: ?You have chest pain. ?You cannot breathe. ?These symptoms may represent a serious problem that is an emergency. Do not wait to see if the symptoms will go away. Get medical help right away. Call   your local emergency services (911 in the [place] Do not drive yourself to the hospital. ?Summary ?COPD is a general term that can be used to describe  many different lung problems that cause lung inflammation and limit airflow. This includes chronic bronchitis and emphysema. ?Exercise and physical activity improve your shortness of breath by increasing blood flow (circulation). This causes your heart to provide more oxygen to your body. ?Contact your health care provider before starting any exercise program or new activity. Ask your health care provider what exercises and activities are safe for you. ?This information is not intended to replace advice given to you by your health care provider. Make sure you discuss any questions you have with your health care provider. ?Document Revised: 07/10/2020 Document Reviewed: 07/10/2020 ?Elsevier Patient Education ? 2023 Elsevier Inc. ? ?

## 2022-12-23 ENCOUNTER — Encounter: Payer: Self-pay | Admitting: Pulmonary Disease

## 2022-12-23 ENCOUNTER — Other Ambulatory Visit: Payer: Self-pay

## 2022-12-23 ENCOUNTER — Ambulatory Visit (INDEPENDENT_AMBULATORY_CARE_PROVIDER_SITE_OTHER): Payer: 59 | Admitting: Pulmonary Disease

## 2022-12-23 VITALS — BP 120/70 | HR 76 | Temp 97.8°F | Ht 64.0 in | Wt 133.6 lb

## 2022-12-23 DIAGNOSIS — F17210 Nicotine dependence, cigarettes, uncomplicated: Secondary | ICD-10-CM

## 2022-12-23 DIAGNOSIS — R911 Solitary pulmonary nodule: Secondary | ICD-10-CM | POA: Diagnosis not present

## 2022-12-23 DIAGNOSIS — J449 Chronic obstructive pulmonary disease, unspecified: Secondary | ICD-10-CM

## 2022-12-23 DIAGNOSIS — Z87891 Personal history of nicotine dependence: Secondary | ICD-10-CM

## 2022-12-23 MED ORDER — TRELEGY ELLIPTA 200-62.5-25 MCG/ACT IN AEPB: 1.0000 | INHALATION_SPRAY | Freq: Every day | RESPIRATORY_TRACT | 0 refills | Status: DC

## 2022-12-23 NOTE — Patient Instructions (Addendum)
We are going to get your follow-up CT scan sooner through the lung cancer screening program.  He will be notify closer to the time that it is due.  We are ordering breathing tests to evaluate your lung function.  Please work on quitting smoking.  We are giving you a trial of medication called Trelegy Ellipta, this is 1 puff daily.  Make sure you rinse your mouth well after you use it.  DO NOT USE THE BREZTRI OR THE SPIRIVA WHILE ON THE TRELEGY.  You may use your rescue inhaler.  Let us know how you do with the Trelegy so we can call in a prescription to your pharmacy.  We will see you in follow-up in 3 months time call sooner should any new problems arise.

## 2022-12-23 NOTE — Progress Notes (Signed)
Subjective:    Patient ID: Emily Johnston, female    DOB: 04/04/1961, 62 y.o.   MRN: [id number] Patient Care Team: Marjie Skiff, NP as PCP - General (Nurse Practitioner) Salena Saner, MD as Consulting Physician (Pulmonary Disease)  Chief Complaint  Patient presents with   Consult    SOB with exertion. Wheezing at night. Cough with right sputum. Congestion and nasal drainage for 4-5 days.   HPI The patient is a 63 year old current smoker (1 PPD) who presents for evaluation of a subsolid nodule noted on lung cancer screening CT.  She is kindly referred by Kandice Robinsons, NP.  The patient's primary care provider is Aura Dials, NP.  Patient has a longstanding history of psoriatic arthritis and skin cancer.  She had a melanoma resected from her foot years ago in [place].  She also has had multiple basal cell carcinomas excised.  She has chronic issues with shortness of breath on exertion for several years now.  She does develop issues with wheezing at nighttime and has cough productive of clear to whitish sputum mostly at nighttime so in the mornings.  Patient had lung cancer screening CT performed 12 November 2022.  This showed centrilobular emphysema with diffuse bronchial wall thickening.  She also has had multiple lung nodules and scarring consistent with smoking-related respiratory bronchiolitis.  She has shown a subsolid nodule on the left lower lobe which is the nodule of concern present.  The scan was reviewed with the patient.  The nodule in question is approximately 11 mm in size with no solid component.  Appears to be relatively unchanged from prior.  We discussed Nodify Lung ([employer]) however, because of her history of melanoma and basal cell carcinoma she is not optimal for this test.  Patient has not had any chest pain.  No orthopnea or paroxysmal nocturnal dyspnea.  No lower extremity edema or calf tenderness.  No weight loss or anorexia.  Her current COPD  management is somewhat haphazard.  She has been using better Breztri on some days and Spiriva on other days.  She uses albuterol approximately 2-3 times per day.  She does not endorse any other symptomatology.   Review of Systems A 10 point review of systems was performed and it is as noted above otherwise negative. Past Medical History:  Diagnosis Date   Anxiety    Arthritis    COPD (chronic obstructive pulmonary disease)    Depression    Fibromyalgia    IBS (irritable bowel syndrome)    Psoriasis    PTSD (post-traumatic stress disorder)    Past Surgical History:  Procedure Laterality Date   ABDOMINAL HYSTERECTOMY     CARPAL TUNNEL RELEASE     KNEE SURGERY     Patient Active Problem List   Diagnosis Date Noted   Vitamin D deficiency 04/27/2022   Elevated low density lipoprotein (LDL) cholesterol level 05/04/2021   Family history of breast cancer in mother 10/29/2020   Basal cell carcinoma (BCC) 07/10/2020   Spondylosis of cervical spine with myelopathy and radiculopathy 10/24/2019   Aortic atherosclerosis 06/15/2019   Acute maxillary sinusitis 04/15/2019   Psoriatic arthritis 03/22/2019   Centrilobular emphysema 02/22/2019   Nicotine dependence, cigarettes, w unsp disorders 02/22/2019   IBS (irritable bowel syndrome) 11/01/2018   Severe recurrent major depression without psychotic features 01/12/2018   PTSD (post-traumatic stress disorder) 01/12/2018   Fibromyalgia 01/12/2018   Family History  Problem Relation Age of Onset   Breast cancer Mother 19  Social History   Tobacco Use   Smoking status: Every Day    Packs/day: 1.00    Years: 41.00    Additional pack years: 0.00    Total pack years: 41.00    Types: Cigarettes   Smokeless tobacco: Never   Tobacco comments:    1 PPD - 12/23/2022 khj  Substance Use Topics   Alcohol use: No   No Known Allergies  Current Meds  Medication Sig   acitretin (SORIATANE) 25 MG capsule Take 1 capsule by mouth daily.    albuterol (VENTOLIN HFA) 108 (90 Base) MCG/ACT inhaler Inhale 2 puffs into the lungs every 6 (six) hours as needed for wheezing or shortness of breath.   Cholecalciferol 1.25 MG (50000 UT) TABS Take 1 tablet by mouth once a week.   clobetasol ointment (TEMOVATE) 0.05 % Apply 1 application. topically 2 (two) times daily.   clonazePAM (KLONOPIN) 0.5 MG tablet Take 1 tablet (0.5 mg total) by mouth 2 (two) times daily as needed for anxiety.   DULoxetine (CYMBALTA) 60 MG capsule Take 2 capsules (120 mg total) by mouth daily.   fluticasone (FLONASE) 50 MCG/ACT nasal spray SPRAY 2 SPRAYS INTO EACH NOSTRIL EVERY DAY   Fluticasone-Umeclidin-Vilant (TRELEGY ELLIPTA) 200-62.5-25 MCG/ACT AEPB Inhale 1 puff into the lungs daily.   folic acid (FOLVITE) 1 MG tablet Take 1 mg by mouth daily.   hydrocortisone 1 % ointment Apply 1 Application topically 2 (two) times daily.   meclizine (ANTIVERT) 12.5 MG tablet Take 1 tablet (12.5 mg total) by mouth 3 (three) times daily as needed for dizziness.   montelukast (SINGULAIR) 10 MG tablet Take 1 tablet (10 mg total) by mouth at bedtime.   Multiple Vitamin (MULTIVITAMIN) tablet Take 1 tablet by mouth daily.   rosuvastatin (CRESTOR) 10 MG tablet Take 1 tablet (10 mg total) by mouth daily.   [DISCONTINUED] Budeson-Glycopyrrol-Formoterol (BREZTRI AEROSPHERE) 160-9-4.8 MCG/ACT AERO Inhale 2 puffs into the lungs 2 (two) times daily.   [DISCONTINUED] lactobacillus acidophilus & bulgar (LACTINEX) chewable tablet Chew 1 tablet by mouth 3 (three) times daily with meals.   [DISCONTINUED] methotrexate 50 MG/2ML injection Inject 8 mLs into the skin once a week. (Patient not taking: Reported on 12/25/2022)   [DISCONTINUED] predniSONE (DELTASONE) 10 MG tablet Take 1 tablet (10 mg total) by mouth daily. Day 1-3: take 4 tablets PO daily Day 4-6: take 3 tablets PO daily Day 7-9: take 2 tablets PO daily Day 10-12: take 1 tablet PO daily (Patient not taking: Reported on 12/25/2022)    Immunization History  Administered Date(s) Administered   Influenza, Seasonal, Injecte, Preservative Fre 06/22/2012   Influenza,inj,Quad PF,6+ Mos 09/26/2015, 06/23/2016, 10/25/2018, 06/01/2019, 07/10/2020, 09/18/2021   Influenza-Unspecified 07/03/2013   PFIZER(Purple Top)SARS-COV-2 Vaccination 10/10/2020   Pneumococcal Polysaccharide-23 11/03/2013   Tdap 11/18/2017       Objective:   Physical Exam BP 120/70 (BP Location: Left Arm, Cuff Size: Normal)   Pulse 76   Temp 97.8 F (36.6 C)   Ht  (1.626 m)   Wt 133 lb 9.6 oz (60.6 kg)   SpO2 96%   BMI 22.93 kg/m   SpO2: 96 % O2 Device: None (Room air)  GENERAL: HEAD: Normocephalic, atraumatic.  EYES: Pupils equal, round, reactive to light.  No scleral icterus.  MOUTH:  NECK: Supple. No thyromegaly. Trachea midline. No JVD.  No adenopathy. PULMONARY: Good air entry bilaterally.  No adventitious sounds. CARDIOVASCULAR: S1 and S2. Regular rate and rhythm.  ABDOMEN: MUSCULOSKELETAL: No joint deformity, no clubbing, no  edema.  NEUROLOGIC:  SKIN: Intact,warm,dry. PSYCH:  Representative image from low-dose CT chest performed 12 November 2022 showing a subsolid nodule in the left lower lobe:          Assessment & Plan:     ICD-10-CM   1. COPD suggested by initial evaluation  J44.9 Pulmonary Function Test [facility] Only   Will consolidate medications, obtain PFTs Trelegy Ellipta 200, 1 inhalation daily Continue as needed albuterol Stop Breztri and Spiriva    2. Lung nodule seen on imaging study  R91.1    Recommend ongoing follow-up with lung cancer screening Patient scheduled for appropriate follow-up    3. Tobacco dependence due to cigarettes  F17.210    Patient counseled regards discontinuation of smoking Total counseling time 3 to 5 minutes     Orders Placed This Encounter  Procedures   Pulmonary Function Test [facility] Only    Standing Status:   Future    Standing Expiration Date:   12/23/2023    Order Specific  Question:   Full PFT: includes the following: basic spirometry, spirometry pre & post bronchodilator, diffusion capacity (DLCO), lung volumes    Answer:   Full PFT    Order Specific Question:   This test can only be performed at    Answer:   [facility]   Meds ordered this encounter  Medications   Fluticasone-Umeclidin-Vilant (TRELEGY ELLIPTA) 200-62.5-25 MCG/ACT AEPB    Sig: Inhale 1 puff into the lungs daily.    Dispense:  28 each    Refill:  0    Order Specific Question:   Lot Number?    Answer:   [id number]    Order Specific Question:   Expiration Date?    Answer:   02/14/2024    Order Specific Question:   Quantity    Answer:   2   Will see the patient in follow-up in 3 months time she is to call sooner should any new problems arise.  Gailen Shelter, MD [facility]  [facility]   *This note was dictated using voice recognition software/Dragon.  Despite best efforts to proofread, errors can occur which can change the meaning. Any transcriptional errors that result from this process are unintentional and may not be fully corrected at the time of dictation.

## 2022-12-25 ENCOUNTER — Ambulatory Visit (INDEPENDENT_AMBULATORY_CARE_PROVIDER_SITE_OTHER): Payer: 59 | Admitting: Nurse Practitioner

## 2022-12-25 ENCOUNTER — Encounter: Payer: Self-pay | Admitting: Nurse Practitioner

## 2022-12-25 VITALS — BP 121/79 | HR 67 | Temp 97.7°F | Ht 64.02 in | Wt 136.1 lb

## 2022-12-25 DIAGNOSIS — J0100 Acute maxillary sinusitis, unspecified: Secondary | ICD-10-CM | POA: Diagnosis not present

## 2022-12-25 DIAGNOSIS — J432 Centrilobular emphysema: Secondary | ICD-10-CM

## 2022-12-25 MED ORDER — PREDNISONE 20 MG PO TABS: 40.0000 mg | ORAL_TABLET | Freq: Every day | ORAL | 0 refills | Status: AC

## 2022-12-25 MED ORDER — DOXYCYCLINE HYCLATE 100 MG PO TABS: 100.0000 mg | ORAL_TABLET | Freq: Two times a day (BID) | ORAL | 0 refills | Status: DC

## 2022-12-25 MED ORDER — LACTINEX PO CHEW: 1.0000 | CHEWABLE_TABLET | Freq: Three times a day (TID) | ORAL | 12 refills | Status: DC

## 2022-12-25 NOTE — Assessment & Plan Note (Signed)
Chronic, ongoing. Continue collaboration with pulmonary and Trelegy as currently ordered by them, she is tolerating this well.  Continue PRN Albuterol.  Recommend complete cessation smoking.  Continue annual CT screening.  Continue Singulair to assist with exacerbations caused by allergies, discussed BLACK BOX warning with her and she is aware to stop if any SI presents.  Return in 5 months.

## 2022-12-25 NOTE — Assessment & Plan Note (Signed)
Acute for 1 1/2 weeks, suspect exacerbated by allergies.  At this time will treat with Doxycycline which she has tolerated in past and Prednisone 40 MG daily for 5 days.  Recommend she continue Singulair and Claritin daily.  Continue collaboration with ENT, she is working on scheduling with them.  Recommend: - Increased rest - Increasing Fluids - Acetaminophen as needed for fever/pain.  - Salt water gargling, chloraseptic spray and throat lozenges - Mucinex.  - Humidifying the air.

## 2022-12-25 NOTE — Progress Notes (Signed)
BP 121/79   Pulse 67   Temp 97.7 F (36.5 C) (Oral)   Ht 5' 4.02" (1.626 m)   Wt 136 lb 1.6 oz (61.7 kg)   SpO2 98%   BMI 23.35 kg/m    Subjective:    Patient ID: Emily Johnston, female    DOB: [birth year], 62 y.o.   MRN: [id number]  HPI: Emily SereneJacqueline E Scherman is a 62 y.o. female  Chief Complaint  Patient presents with   COPD    Added Breztri at last visit   COPD Presents today for follow-up on Breztri -- she has seen pulmonary since that time and they have changed her to Trelegy.  Saw pulmonary on 12/23/22.  Had lung screening on 11/12/22 - with emphysema and aortic atherosclerosis. Had one nodule that had grown a little and pulmonary plans to reassess in 6 months. She is walking again -- going for walks 5 days a week.  Reports for 1 1/2 weeks has been fighting a sinus infection.  Thought it was pollen at first.  Throat has been itchy and ears with pressure + coughing a bit more with yellow/whitish phlegm.   COPD status: stable Satisfied with current treatment?: yes Oxygen use: no Dyspnea frequency: none Cough frequency: increased at present Rescue inhaler frequency: no recent use -- has not used in 2 months Limitation of activity: no Productive cough: yes Last Spirometry: with pulmonary Pneumovax: Up to Date Influenza: Up to Date   Relevant past medical, surgical, family and social history reviewed and updated as indicated. Interim medical history since our last visit reviewed. Allergies and medications reviewed and updated.  Review of Systems  Constitutional:  Positive for fatigue. Negative for activity change, appetite change, chills and fever.  HENT:  Positive for congestion, postnasal drip, rhinorrhea, sinus pressure and sinus pain. Negative for ear discharge, ear pain, facial swelling, sneezing, sore throat and voice change.   Respiratory:  Positive for cough. Negative for chest tightness, shortness of breath and wheezing.   Cardiovascular:  Negative for  chest pain, palpitations and leg swelling.  Gastrointestinal: Negative.   Endocrine: Negative.   Neurological:  Positive for headaches. Negative for dizziness and numbness.  Psychiatric/Behavioral: Negative.      Per HPI unless specifically indicated above     Objective:    BP 121/79   Pulse 67   Temp 97.7 F (36.5 C) (Oral)   Ht 5' 4.02" (1.626 m)   Wt 136 lb 1.6 oz (61.7 kg)   SpO2 98%   BMI 23.35 kg/m   Wt Readings from Last 3 Encounters:  12/25/22 136 lb 1.6 oz (61.7 kg)  12/23/22 133 lb 9.6 oz (60.6 kg)  11/23/22 134 lb (60.8 kg)    Physical Exam Vitals and nursing note reviewed.  Constitutional:      General: She is awake. She is not in acute distress.    Appearance: She is well-developed and well-groomed. She is not ill-appearing or toxic-appearing.  HENT:     Head: Normocephalic.     Right Ear: Hearing, ear canal and external ear normal. A middle ear effusion is present. There is no impacted cerumen. Tympanic membrane is not injected.     Left Ear: Hearing, ear canal and external ear normal. A middle ear effusion is present. Tympanic membrane is not injected.     Nose: Rhinorrhea present. Rhinorrhea is clear.     Right Sinus: Maxillary sinus tenderness present. No frontal sinus tenderness.     Left Sinus: Maxillary sinus  tenderness present. No frontal sinus tenderness.     Mouth/Throat:     Mouth: Mucous membranes are moist.     Pharynx: Posterior oropharyngeal erythema (mild cobblestone pattern) present. No pharyngeal swelling or oropharyngeal exudate.  Eyes:     General: Lids are normal.        Right eye: No discharge.        Left eye: No discharge.     Conjunctiva/sclera: Conjunctivae normal.     Pupils: Pupils are equal, round, and reactive to light.  Neck:     Thyroid: No thyromegaly.     Vascular: No carotid bruit or JVD.  Cardiovascular:     Rate and Rhythm: Normal rate and regular rhythm.     Heart sounds: Normal heart sounds. No murmur heard.     No gallop.  Pulmonary:     Effort: Pulmonary effort is normal. No accessory muscle usage or respiratory distress.     Breath sounds: Normal breath sounds. No decreased breath sounds, wheezing or rhonchi.  Abdominal:     General: Bowel sounds are normal. There is no distension.     Palpations: Abdomen is soft.     Tenderness: There is no abdominal tenderness.  Musculoskeletal:     Cervical back: Normal range of motion and neck supple.     Right lower leg: No edema.     Left lower leg: No edema.  Lymphadenopathy:     Cervical: No cervical adenopathy.  Skin:    General: Skin is warm and dry.  Neurological:     Mental Status: She is alert and oriented to person, place, and time.     Deep Tendon Reflexes: Reflexes are normal and symmetric.  Psychiatric:        Attention and Perception: Attention normal.        Mood and Affect: Mood normal.        Speech: Speech normal.        Behavior: Behavior normal. Behavior is cooperative.        Thought Content: Thought content normal.     Results for orders placed or performed in visit on 11/13/22  CBC with Differential/Platelet  Result Value Ref Range   WBC 4.3 3.4 - 10.8 x10E3/uL   RBC 4.65 3.77 - 5.28 x10E6/uL   Hemoglobin 14.5 11.1 - 15.9 g/dL   Hematocrit 07.8 67.5 - 46.6 %   MCV 95 79 - 97 fL   MCH 31.2 26.6 - 33.0 pg   MCHC 33.0 31.5 - 35.7 g/dL   RDW 44.9 20.1 - 00.7 %   Platelets 226 150 - 450 x10E3/uL   Neutrophils 43 Not Estab. %   Lymphs 44 Not Estab. %   Monocytes 9 Not Estab. %   Eos 3 Not Estab. %   Basos 1 Not Estab. %   Neutrophils Absolute 1.8 1.4 - 7.0 x10E3/uL   Lymphocytes Absolute 1.9 0.7 - 3.1 x10E3/uL   Monocytes Absolute 0.4 0.1 - 0.9 x10E3/uL   EOS (ABSOLUTE) 0.1 0.0 - 0.4 x10E3/uL   Basophils Absolute 0.1 0.0 - 0.2 x10E3/uL   Immature Granulocytes 0 Not Estab. %   Immature Grans (Abs) 0.0 0.0 - 0.1 x10E3/uL  Comprehensive metabolic panel  Result Value Ref Range   Glucose 76 70 - 99 mg/dL   BUN 8 8 -  27 mg/dL   Creatinine, Ser 1.21 0.57 - 1.00 mg/dL   eGFR 80 >97 JO/ITG/5.49   BUN/Creatinine Ratio 10 (L) 12 - 28   Sodium 145 (  H) 134 - 144 mmol/L   Potassium 3.7 3.5 - 5.2 mmol/L   Chloride 105 96 - 106 mmol/L   CO2 25 20 - 29 mmol/L   Calcium 9.5 8.7 - 10.3 mg/dL   Total Protein 6.2 6.0 - 8.5 g/dL   Albumin 4.1 3.8 - 4.9 g/dL   Globulin, Total 2.1 1.5 - 4.5 g/dL   Albumin/Globulin Ratio 2.0 1.2 - 2.2   Bilirubin Total <0.2 0.0 - 1.2 mg/dL   Alkaline Phosphatase 106 44 - 121 IU/L   AST 15 0 - 40 IU/L   ALT 13 0 - 32 IU/L  Lipid Panel w/o Chol/HDL Ratio  Result Value Ref Range   Cholesterol, Total 224 (H) 100 - 199 mg/dL   Triglycerides 867 (H) 0 - 149 mg/dL   HDL 50 >61 mg/dL   VLDL Cholesterol Cal 27 5 - 40 mg/dL   LDL Chol Calc (NIH) 950 (H) 0 - 99 mg/dL  TSH  Result Value Ref Range   TSH 1.270 0.450 - 4.500 uIU/mL  VITAMIN D 25 Hydroxy (Vit-D Deficiency, Fractures)  Result Value Ref Range   Vit D, 25-Hydroxy 20.2 (L) 30.0 - 100.0 ng/mL      Assessment & Plan:   Problem List Items Addressed This Visit       Respiratory   Acute maxillary sinusitis    Acute for 1 1/2 weeks, suspect exacerbated by allergies.  At this time will treat with Doxycycline which she has tolerated in past and Prednisone 40 MG daily for 5 days.  Recommend she continue Singulair and Claritin daily.  Continue collaboration with ENT, she is working on scheduling with them.  Recommend: - Increased rest - Increasing Fluids - Acetaminophen as needed for fever/pain.  - Salt water gargling, chloraseptic spray and throat lozenges - Mucinex.  - Humidifying the air.       Relevant Medications   predniSONE (DELTASONE) 20 MG tablet   doxycycline (VIBRA-TABS) 100 MG tablet   Centrilobular emphysema - Primary    Chronic, ongoing. Continue collaboration with pulmonary and Trelegy as currently ordered by them, she is tolerating this well.  Continue PRN Albuterol.  Recommend complete cessation smoking.   Continue annual CT screening.  Continue Singulair to assist with exacerbations caused by allergies, discussed BLACK BOX warning with her and she is aware to stop if any SI presents.  Return in 5 months.      Relevant Medications   predniSONE (DELTASONE) 20 MG tablet     Follow up plan: Return in about 5 months (around 05/27/2023) for COPD, MOOD, RA, HLD.

## 2022-12-29 ENCOUNTER — Encounter: Payer: Self-pay | Admitting: *Deleted

## 2022-12-29 ENCOUNTER — Ambulatory Visit: Payer: Self-pay | Admitting: *Deleted

## 2022-12-29 ENCOUNTER — Encounter: Payer: Self-pay | Admitting: Pulmonary Disease

## 2022-12-29 NOTE — Telephone Encounter (Signed)
Patient returning call to see if PCP can send in alternative antibiotic due to c/o diarrhea from taking doxycyline . Patient reports she has completed the prednisone but continues with sinus sx. Reports the "probitoic" PCP called in to pharmacy , her insurance does not cover. In review of chart no probiotic noted called in to pharmacy. Please advise. Reviewed with patient PCP may not respond immediately but request will be reviewed.

## 2022-12-29 NOTE — Telephone Encounter (Signed)
Message from Allen Kell sent at 12/29/2022 11:35 AM EDT  Summary: doxycycline medication is tearing pt stomach up   Pt states that she seen PCP on 12/25/22 for a sinus infection and fluid in ears. Per pt she is getting worse and the medication is tearing her stomach up. Pt was prescribed doxycycline (VIBRA-TABS) 100 MG tablet. Pt is wanting to see if something else can be called in for her. Please advise.          Call History   Type Contact Phone/Fax User  12/29/2022 11:33 AM EDT Phone (Incoming) Emily Johnston, Emily Johnston (Self) [phone] Judie Petit) Allred, Jasmine Awe   Reason for Disposition  [1] Caller has URGENT medicine question about med that PCP or specialist prescribed AND [2] triager unable to answer question    Doxycycline giving her diarrhea and she is not feeling any better since being seen on 4/11  Answer Assessment - Initial Assessment Questions 1. NAME of MEDICINE: "What medicine(s) are you calling about?"     Doxycycline has my stomach tore up.   I'm having diarrhea.    I feel worse than when I saw her.   (Seen 4/11)   My ears are bothering me and my throat is sore.    My head is full of congestion still.   I'm taking Sudafed but I still feel awful.   Stayed in bed most of the day yesterday. 2. QUESTION: "What is your question?" (e.g., double dose of medicine, side effect)     Can she prescribe me something else?  3. PRESCRIBER: "Who prescribed the medicine?" Reason: if prescribed by specialist, call should be referred to that group.     Aura Dials, NP 4. SYMPTOMS: "Do you have any symptoms?" If Yes, ask: "What symptoms are you having?"  "How bad are the symptoms (e.g., mild, moderate, severe)     Sinus congestion, ear congestion, sore throat and diarrhea started after the antibiotic was started. 5. PREGNANCY:  "Is there any chance that you are pregnant?" "When was your last menstrual period?"     N/A due to age  Protocols used: Medication Question Call-A-AH

## 2022-12-29 NOTE — Telephone Encounter (Signed)
  Chief Complaint: Diarrhea from the Doxycycline Symptoms: Diarrhea after starting the Doxycycline.   Seen on 4/11 for sinus congestion, sore throat. Frequency: Diarrhea started after starting the Doxycycline. Pertinent Negatives: Patient denies Feeling any better.   "In fact I feel worse than we I saw her" Disposition: [] ED /[] Urgent Care (no appt availability in office) / [] Appointment(In office/virtual)/ []  [facility]/ [] Home Care/ [] Refused Recommended Disposition /[] [place] [facility]/ [x]  Follow-up with PCP Additional Notes: Message sent to [facility], NP.    Pt. Agreeable to someone calling her back.

## 2022-12-29 NOTE — Telephone Encounter (Signed)
This encounter was created in error - please disregard.

## 2022-12-30 MED ORDER — CEPHALEXIN 500 MG PO CAPS: 500.0000 mg | ORAL_CAPSULE | Freq: Two times a day (BID) | ORAL | 0 refills | Status: AC

## 2022-12-30 NOTE — Telephone Encounter (Signed)
Pt. Calling back, upset "no one has called me back. I just feel miserable with the original problem and now diarrhea." Please advise pt.

## 2022-12-30 NOTE — Addendum Note (Signed)
Addended by: Aura Dials T on: 12/30/2022 01:01 PM   Modules accepted: Orders

## 2022-12-30 NOTE — Telephone Encounter (Signed)
Patient made aware of Provider's recommendations and verbalized understanding.   

## 2022-12-30 NOTE — Telephone Encounter (Signed)
Tried returning patient's call and vm was full.

## 2023-01-01 ENCOUNTER — Encounter: Payer: Self-pay | Admitting: Intensive Care

## 2023-01-01 ENCOUNTER — Other Ambulatory Visit: Payer: Self-pay

## 2023-01-01 ENCOUNTER — Emergency Department: Payer: 59

## 2023-01-01 ENCOUNTER — Emergency Department
Admission: EM | Admit: 2023-01-01 | Discharge: 2023-01-01 | Disposition: A | Discharge status: Home / Self Care | Payer: 59 | Attending: Emergency Medicine | Admitting: Emergency Medicine

## 2023-01-01 DIAGNOSIS — E876 Hypokalemia: Secondary | ICD-10-CM

## 2023-01-01 DIAGNOSIS — K921 Melena: Secondary | ICD-10-CM | POA: Diagnosis present

## 2023-01-01 DIAGNOSIS — J449 Chronic obstructive pulmonary disease, unspecified: Secondary | ICD-10-CM | POA: Insufficient documentation

## 2023-01-01 DIAGNOSIS — Z1152 Encounter for screening for COVID-19: Secondary | ICD-10-CM | POA: Diagnosis not present

## 2023-01-01 DIAGNOSIS — R195 Other fecal abnormalities: Secondary | ICD-10-CM

## 2023-01-01 DIAGNOSIS — R531 Weakness: Secondary | ICD-10-CM | POA: Diagnosis not present

## 2023-01-01 HISTORY — DX: Other cervical disc degeneration, unspecified cervical region: M50.30

## 2023-01-01 LAB — CBC
HCT: 42.7 % (ref 36.0–46.0)
Hemoglobin: 14.2 g/dL (ref 12.0–15.0)
MCH: 31.9 pg (ref 26.0–34.0)
MCHC: 33.3 g/dL (ref 30.0–36.0)
MCV: 96 fL (ref 80.0–100.0)
Platelets: 261 10*3/uL (ref 150–400)
RBC: 4.45 MIL/uL (ref 3.87–5.11)
RDW: 14.6 % (ref 11.5–15.5)
WBC: 9.4 10*3/uL (ref 4.0–10.5)
nRBC: 0 % (ref 0.0–0.2)

## 2023-01-01 LAB — BASIC METABOLIC PANEL
Anion gap: 9 (ref 5–15)
BUN: 13 mg/dL (ref 6–20)
CO2: 23 mmol/L (ref 22–32)
Calcium: 9 mg/dL (ref 8.9–10.3)
Chloride: 108 mmol/L (ref 98–111)
Creatinine, Ser: 0.86 mg/dL (ref 0.44–1.00)
GFR, Estimated: >60 mL/min (ref >60)
Glucose, Bld: 101 mg/dL — ABNORMAL HIGH (ref 70–99)
Potassium: 3.1 mmol/L — ABNORMAL LOW (ref 3.5–5.1)
Sodium: 140 mmol/L (ref 135–145)

## 2023-01-01 LAB — URINALYSIS, ROUTINE W REFLEX MICROSCOPIC
Bacteria, UA: NONE SEEN
Bilirubin Urine: NEGATIVE
Glucose, UA: NEGATIVE mg/dL
Ketones, ur: NEGATIVE mg/dL
Leukocytes,Ua: NEGATIVE
Nitrite: NEGATIVE
Protein, ur: NEGATIVE mg/dL
Specific Gravity, Urine: 1.005 (ref 1.005–1.030)
pH: 5 (ref 5.0–8.0)

## 2023-01-01 LAB — MAGNESIUM: Magnesium: 2.3 mg/dL (ref 1.7–2.4)

## 2023-01-01 LAB — RESP PANEL BY RT-PCR (RSV, FLU A&B, COVID)  RVPGX2
Influenza A by PCR: NEGATIVE
Influenza B by PCR: NEGATIVE
Resp Syncytial Virus by PCR: NEGATIVE
SARS Coronavirus 2 by RT PCR: NEGATIVE

## 2023-01-01 MED ORDER — POTASSIUM CHLORIDE CRYS ER 20 MEQ PO TBCR
40.0000 meq | EXTENDED_RELEASE_TABLET | Freq: Once | ORAL | Status: AC
  Administered 2023-01-01: 40 meq via ORAL
  Filled 2023-01-01: qty 2

## 2023-01-01 MED ORDER — SUCRALFATE 1 G PO TABS: 1.0000 g | ORAL_TABLET | Freq: Four times a day (QID) | ORAL | 1 refills | Status: DC

## 2023-01-01 MED ORDER — SODIUM CHLORIDE 0.9 % IV BOLUS
1000.0000 mL | Freq: Once | INTRAVENOUS | Status: AC
  Administered 2023-01-01: 1000 mL via INTRAVENOUS

## 2023-01-01 MED ORDER — PANTOPRAZOLE SODIUM 40 MG PO TBEC: 40.0000 mg | DELAYED_RELEASE_TABLET | Freq: Every day | ORAL | 1 refills | Status: DC

## 2023-01-01 NOTE — ED Triage Notes (Signed)
Patient c/o black stool and left flank pain. Reports diarrhea. Reports she was placed on antibiotic by PCP and not feeling any better  Denies fevers

## 2023-01-01 NOTE — Discharge Instructions (Signed)
Please seek medical attention for any high fevers, chest pain, shortness of breath, change in behavior, persistent vomiting, bloody stool or any other new or concerning symptoms.  

## 2023-01-01 NOTE — ED Provider Notes (Signed)
[facility] Provider Note    Event Date/Time   First MD Initiated Contact with Patient 01/01/23 1503     (approximate)   History   Melena and Flank Pain   HPI  Emily Johnston is a 62 y.o. female who presents to the emergency department today with myriad medical complaints however primary complaint today is for melena.  She states she has noticed this over the past few days.  She will have dark black stool.  This has been accompanied by some right upper abdominal and flank pain. She additionally has complaints of continued ear ache and cough.  She saw her primary care doctor about a week ago and was placed on antibiotics.  It does not sound like any specific testing was performed prior to initiating antibiotics.  She was also placed on steroids given history of COPD.  She additionally feels weak and worn out. She is currently being worked up for possible lupus and states she has been put on medication recently for her arthritis.      Physical Exam   Triage Vital Signs: ED Triage Vitals  Enc Vitals Group     BP 01/01/23 1330 (!) 145/95     Pulse Rate 01/01/23 1330 87     Resp 01/01/23 1330 18     Temp 01/01/23 1330 98.7 F (37.1 C)     Temp Source 01/01/23 1330 Oral     SpO2 01/01/23 1330 95 %     Weight 01/01/23 1331 131 lb (59.4 kg)     Height 01/01/23 1331  (1.626 m)     Head Circumference --      Peak Flow --      Pain Score 01/01/23 1331 5     Pain Loc --      Pain Edu? --      Excl. in GC? --     Most recent vital signs: Vitals:   01/01/23 1330 01/01/23 1456  BP: (!) 145/95 (!) 134/94  Pulse: 87 76  Resp: 18   Temp: 98.7 F (37.1 C)   SpO2: 95% 99%   General: Awake, alert, oriented. CV:  Good peripheral perfusion. Regular rate and rhythm. Resp:  Normal effort. Lungs clear. Abd:  No distention. Non tender. Other:  No rash.   ED Results / Procedures / Treatments   Labs (all labs ordered are listed, but only abnormal  results are displayed) Labs Reviewed  URINALYSIS, ROUTINE W REFLEX MICROSCOPIC - Abnormal; Notable for the following components:      Result Value   Color, Urine STRAW (*)    APPearance CLEAR (*)    Hgb urine dipstick MODERATE (*)    All other components within normal limits  BASIC METABOLIC PANEL - Abnormal; Notable for the following components:   Potassium 3.1 (*)    Glucose, Bld 101 (*)    All other components within normal limits  RESP PANEL BY RT-PCR (RSV, FLU A&B, COVID)  RVPGX2  CBC  MAGNESIUM     EKG  None  RADIOLOGY I independently interpreted and visualized the CXR. My interpretation: No pneumonia Radiology interpretation:  IMPRESSION:  1. No acute lung process.  2. Chronic hyperinflation and COPD.      PROCEDURES:  Critical Care performed: No    MEDICATIONS ORDERED IN ED: Medications  potassium chloride SA (KLOR-CON M) CR tablet 40 mEq (has no administration in time range)  sodium chloride 0.9 % bolus 1,000 mL (has no administration in time  range)     IMPRESSION / MDM / ASSESSMENT AND PLAN / ED COURSE  I reviewed the triage vital signs and the nursing notes.                              Differential diagnosis includes, but is not limited to, gastritis, AVM, anemia, electrolyte abnormality, infection  Patient's presentation is most consistent with acute presentation with potential threat to life or bodily function.   The patient is on the cardiac monitor to evaluate for evidence of arrhythmia and/or significant heart rate changes.  Patient presented to the emergency department today with multiple medical complaints, with primary being black stools for the past few days. Patient is neither tachycardic nor hypotensive. Blood work without any anemia to signify significant bleed. It did however show hypokalemia. Patient states her apetite has been low. Patient was given IVFs and potassium here. COVID/RSV/flu negative. CXR without pneumonia. At this time  think likely melena coming from gastritis. Discussed this with the patient. Given that it has been present for a few days and patient does not have anemia think it is reasonable to treat as an outpatient. Will discharge with antacid, sucralfate and dietary guidelines. Did discuss bleeding return precautions with the patient.      FINAL CLINICAL IMPRESSION(S) / ED DIAGNOSES   Final diagnoses:  Dark stools  Weakness  Hypokalemia   Note:  This document was prepared using Dragon voice recognition software and may include unintentional dictation errors.    Phineas Semen, MD 01/01/23 [phone]

## 2023-01-05 ENCOUNTER — Telehealth: Payer: Self-pay

## 2023-01-05 ENCOUNTER — Encounter: Payer: Self-pay | Admitting: Nurse Practitioner

## 2023-01-05 ENCOUNTER — Telehealth: Payer: Self-pay | Admitting: Nurse Practitioner

## 2023-01-05 DIAGNOSIS — R195 Other fecal abnormalities: Secondary | ICD-10-CM

## 2023-01-05 NOTE — Telephone Encounter (Signed)
Copied from CRM [id number]. Topic: Referral - Request for Referral >> Jan 05, 2023  2:11 PM Franchot Heidelberg wrote: Has patient seen PCP for this complaint? Yes.   *If NO, is insurance requiring patient see PCP for this issue before PCP can refer them? Referral for which specialty: GI  Preferred provider/office: Dr. Wyline Mood  Reason for referral: Pt has black stools, must be stated on referral.

## 2023-01-05 NOTE — Telephone Encounter (Signed)
Pt left message to schedule colonoscopy please return call  

## 2023-01-05 NOTE — Telephone Encounter (Signed)
Spoken to patient and she stated that she had other issues as well. I went and asked patient to contact her pcp to put in a referral. Patient have not seen Dr Tobi Bastos yet, her last colonoscopy was canceled and she was never seen in the office.

## 2023-01-06 ENCOUNTER — Other Ambulatory Visit: Payer: Self-pay

## 2023-01-06 ENCOUNTER — Encounter: Payer: Self-pay | Admitting: Radiology

## 2023-01-06 ENCOUNTER — Emergency Department: Payer: 59

## 2023-01-06 ENCOUNTER — Emergency Department
Admission: EM | Admit: 2023-01-06 | Discharge: 2023-01-06 | Disposition: A | Discharge status: Home / Self Care | Payer: 59 | Attending: Emergency Medicine | Admitting: Emergency Medicine

## 2023-01-06 DIAGNOSIS — R109 Unspecified abdominal pain: Secondary | ICD-10-CM

## 2023-01-06 DIAGNOSIS — R300 Dysuria: Secondary | ICD-10-CM | POA: Diagnosis not present

## 2023-01-06 DIAGNOSIS — J449 Chronic obstructive pulmonary disease, unspecified: Secondary | ICD-10-CM | POA: Insufficient documentation

## 2023-01-06 DIAGNOSIS — R1012 Left upper quadrant pain: Secondary | ICD-10-CM

## 2023-01-06 DIAGNOSIS — K2900 Acute gastritis without bleeding: Secondary | ICD-10-CM | POA: Diagnosis not present

## 2023-01-06 LAB — URINALYSIS, ROUTINE W REFLEX MICROSCOPIC
Bacteria, UA: NONE SEEN
Bilirubin Urine: NEGATIVE
Glucose, UA: NEGATIVE mg/dL
Ketones, ur: NEGATIVE mg/dL
Leukocytes,Ua: NEGATIVE
Nitrite: NEGATIVE
Protein, ur: NEGATIVE mg/dL
Specific Gravity, Urine: 1.005 (ref 1.005–1.030)
pH: 5 (ref 5.0–8.0)

## 2023-01-06 LAB — CBC
HCT: 43.3 % (ref 36.0–46.0)
Hemoglobin: 14.2 g/dL (ref 12.0–15.0)
MCH: 31.7 pg (ref 26.0–34.0)
MCHC: 32.8 g/dL (ref 30.0–36.0)
MCV: 96.7 fL (ref 80.0–100.0)
Platelets: 223 10*3/uL (ref 150–400)
RBC: 4.48 MIL/uL (ref 3.87–5.11)
RDW: 13.9 % (ref 11.5–15.5)
WBC: 8.1 10*3/uL (ref 4.0–10.5)
nRBC: 0 % (ref 0.0–0.2)

## 2023-01-06 LAB — COMPREHENSIVE METABOLIC PANEL
ALT: 14 U/L (ref 0–44)
AST: 18 U/L (ref 15–41)
Albumin: 3.7 g/dL (ref 3.5–5.0)
Alkaline Phosphatase: 84 U/L (ref 38–126)
Anion gap: 7 (ref 5–15)
BUN: 10 mg/dL (ref 6–20)
CO2: 28 mmol/L (ref 22–32)
Calcium: 9.3 mg/dL (ref 8.9–10.3)
Chloride: 105 mmol/L (ref 98–111)
Creatinine, Ser: 0.86 mg/dL (ref 0.44–1.00)
GFR, Estimated: >60 mL/min (ref >60)
Glucose, Bld: 100 mg/dL — ABNORMAL HIGH (ref 70–99)
Potassium: 3.5 mmol/L (ref 3.5–5.1)
Sodium: 140 mmol/L (ref 135–145)
Total Bilirubin: 0.6 mg/dL (ref 0.3–1.2)
Total Protein: 7.3 g/dL (ref 6.5–8.1)

## 2023-01-06 LAB — TROPONIN I (HIGH SENSITIVITY): Troponin I (High Sensitivity): 4 ng/L (ref <18)

## 2023-01-06 LAB — LIPASE, BLOOD: Lipase: 35 U/L (ref 11–51)

## 2023-01-06 MED ORDER — PANTOPRAZOLE SODIUM 40 MG PO TBEC: 40.0000 mg | DELAYED_RELEASE_TABLET | Freq: Every day | ORAL | 2 refills | Status: DC

## 2023-01-06 MED ORDER — LACTATED RINGERS IV BOLUS
1000.0000 mL | Freq: Once | INTRAVENOUS | Status: AC
  Administered 2023-01-06: 1000 mL via INTRAVENOUS

## 2023-01-06 MED ORDER — ONDANSETRON HCL 4 MG/2ML IJ SOLN
4.0000 mg | Freq: Once | INTRAMUSCULAR | Status: AC
  Administered 2023-01-06: 4 mg via INTRAVENOUS
  Filled 2023-01-06: qty 2

## 2023-01-06 MED ORDER — IOHEXOL 300 MG/ML  SOLN
100.0000 mL | Freq: Once | INTRAMUSCULAR | Status: AC | PRN
  Administered 2023-01-06: 100 mL via INTRAVENOUS

## 2023-01-06 MED ORDER — MORPHINE SULFATE (PF) 4 MG/ML IV SOLN
4.0000 mg | Freq: Once | INTRAVENOUS | Status: AC
  Administered 2023-01-06: 4 mg via INTRAVENOUS
  Filled 2023-01-06: qty 1

## 2023-01-06 MED ORDER — OXYCODONE-ACETAMINOPHEN 5-325 MG PO TABS: 1.0000 | ORAL_TABLET | ORAL | 0 refills | Status: DC | PRN

## 2023-01-06 NOTE — ED Triage Notes (Signed)
Pt comes with c/o flank pain. Pt states they did xray as the UC. Pt states it showed air pockets. Pt states she just needs to find out what is going on. Pt state she was put on meds for bleeding ulcers. Pt states bloating.

## 2023-01-06 NOTE — ED Provider Notes (Signed)
[facility] Provider Note    Event Date/Time   First MD Initiated Contact with Patient 01/06/23 1539     (approximate)   History   Chief Complaint Flank Pain   HPI  Emily Johnston is a 62 y.o. female with past medical history of IBS, fibromyalgia, COPD, anxiety, and depression who presents to the ED complaining of abdominal pain.  Patient reports that last week she initially developed pain in her left flank, was seen in the ED at that time and diagnosed with gastritis versus PUD, started on Carafate and antacid.  She states that the pain has worsened since then, has come around to the left upper quadrant of her abdomen as well as her abdomen diffusely.  She describes the pain as constant, not exacerbated or alleviated by anything in particular.  She has been feeling nauseous but has not vomited, states she has not had any bowel movement in the past 24 hours.  She endorses dysuria but denies any fevers.  She has not had any chest pain or shortness of breath.     Physical Exam   Triage Vital Signs: ED Triage Vitals  Enc Vitals Group     BP 01/06/23 1500 (!) 131/99     Pulse Rate 01/06/23 1500 97     Resp 01/06/23 1500 18     Temp 01/06/23 1500 97.8 F (36.6 C)     Temp src --      SpO2 01/06/23 1500 99 %     Weight --      Height --      Head Circumference --      Peak Flow --      Pain Score 01/06/23 1459 7     Pain Loc --      Pain Edu? --      Excl. in GC? --     Most recent vital signs: Vitals:   01/06/23 1500  BP: (!) 131/99  Pulse: 97  Resp: 18  Temp: 97.8 F (36.6 C)  SpO2: 99%    Constitutional: Alert and oriented. Eyes: Conjunctivae are normal. Head: Atraumatic. Nose: No congestion/rhinnorhea. Mouth/Throat: Mucous membranes are moist.  Cardiovascular: Normal rate, regular rhythm. Grossly normal heart sounds.  2+ radial pulses bilaterally. Respiratory: Normal respiratory effort.  No retractions. Lungs  CTAB. Gastrointestinal: Soft and tender to palpation diffusely, greatest in the left upper quadrant.  Left CVA tenderness noted. No distention. Musculoskeletal: No lower extremity tenderness nor edema.  Neurologic:  Normal speech and language. No gross focal neurologic deficits are appreciated.    ED Results / Procedures / Treatments   Labs (all labs ordered are listed, but only abnormal results are displayed) Labs Reviewed  COMPREHENSIVE METABOLIC PANEL - Abnormal; Notable for the following components:      Result Value   Glucose, Bld 100 (*)    All other components within normal limits  URINALYSIS, ROUTINE W REFLEX MICROSCOPIC - Abnormal; Notable for the following components:   Color, Urine STRAW (*)    APPearance CLEAR (*)    Hgb urine dipstick MODERATE (*)    All other components within normal limits  LIPASE, BLOOD  CBC  TROPONIN I (HIGH SENSITIVITY)     EKG  ED ECG REPORT I, Chesley Noon, the attending physician, personally viewed and interpreted this ECG.   Date: 01/06/2023  EKG Time: 17:12  Rate: 71  Rhythm: normal sinus rhythm  Axis: Normal  Intervals:none  ST&T Change: None  RADIOLOGY CT abdomen/pelvis  reviewed and interpreted by me with no inflammatory changes, focal fluid collections, or dilated bowel loops.  PROCEDURES:  Critical Care performed: No  Procedures   MEDICATIONS ORDERED IN ED: Medications  morphine (PF) 4 MG/ML injection 4 mg (4 mg Intravenous Given 01/06/23 1619)  ondansetron (ZOFRAN) injection 4 mg (4 mg Intravenous Given 01/06/23 1619)  lactated ringers bolus 1,000 mL (1,000 mLs Intravenous New Bag/Given 01/06/23 1620)  iohexol (OMNIPAQUE) 300 MG/ML solution 100 mL (100 mLs Intravenous Contrast Given 01/06/23 1624)     IMPRESSION / MDM / ASSESSMENT AND PLAN / ED COURSE  I reviewed the triage vital signs and the nursing notes.                              62 y.o. female with past medical history of IBS, fibromyalgia, COPD,  anxiety, and depression who presents to the ED complaining of increasing pain across her entire abdomen over about the past week.  Patient's presentation is most consistent with acute presentation with potential threat to life or bodily function.  Differential diagnosis includes, but is not limited to, gastritis, GERD, pancreatitis, hepatitis, kidney stone, pyelonephritis, bowel obstruction, diverticulitis.  Patient nontoxic-appearing and in no acute distress, vital signs are unremarkable.  She has diffuse tenderness to palpation on exam and we will further assess with CT scan.  Labs are reassuring with no significant anemia, leukocytosis, tract abnormality, or AKI.  LFTs and lipase are unremarkable, urinalysis pending at this time.  With pain in her upper abdomen, we will also screen EKG and troponin.  Plan to treat symptomatically with IV morphine and Zofran, hydrate with IV fluids.  CT imaging is negative for acute process, urinalysis shows no signs of infection.  EKG without evidence of arrhythmia or ischemia and troponin within normal limits, doubt cardiac etiology for her symptoms.  Patient feeling better on reassessment and is appropriate for outpatient management with GI follow-up.  We will prescribe a small amount of pain medication to go with Carafate and PPI, she was counseled to return to the ED for new or worsening symptoms.  Patient agrees with plan.      FINAL CLINICAL IMPRESSION(S) / ED DIAGNOSES   Final diagnoses:  Left flank pain  Left upper quadrant abdominal pain  Acute gastritis without hemorrhage, unspecified gastritis type     Rx / DC Orders   ED Discharge Orders          Ordered    pantoprazole (PROTONIX) 40 MG tablet  Daily        01/06/23 1824    oxyCODONE-acetaminophen (PERCOCET) 5-325 MG tablet  Every 4 hours PRN        01/06/23 1824             Note:  This document was prepared using Dragon voice recognition software and may include unintentional  dictation errors.   Chesley Noon, MD 01/06/23 [phone]

## 2023-01-07 ENCOUNTER — Telehealth: Payer: Self-pay | Admitting: Nurse Practitioner

## 2023-01-07 ENCOUNTER — Encounter: Payer: Self-pay | Admitting: Nurse Practitioner

## 2023-01-07 NOTE — Addendum Note (Signed)
Addended by: Aura Dials T on: 01/07/2023 12:35 PM   Modules accepted: Orders

## 2023-01-07 NOTE — Telephone Encounter (Signed)
Copied from CRM [id number]. Topic: Referral - Status >> Jan 07, 2023 11:13 AM Macon Large wrote: Reason for CRM: Pt requests that the referral to GI clearly states that she is experiencing black tarry stool for 5 days. Pt stated she can not wait until August to be seen. Pt also stated that the referral should be sent to Dr. Wyline Mood

## 2023-01-07 NOTE — Telephone Encounter (Signed)
Patient sent message through mychart. 

## 2023-01-09 ENCOUNTER — Telehealth: Payer: Self-pay

## 2023-01-09 ENCOUNTER — Telehealth: Payer: Self-pay | Admitting: Pulmonary Disease

## 2023-01-09 DIAGNOSIS — M4802 Spinal stenosis, cervical region: Principal | ICD-10-CM

## 2023-01-09 MED ORDER — TRELEGY ELLIPTA 200-62.5-25 MCG/ACT IN AEPB: 1.0000 | INHALATION_SPRAY | Freq: Every day | RESPIRATORY_TRACT | 11 refills | Status: DC

## 2023-01-09 NOTE — Telephone Encounter (Signed)
Patient called to request a refill for her Trelegy to go to [facility] on [address].  Please advise.  CB# [phone]

## 2023-01-09 NOTE — Telephone Encounter (Signed)
Trelegy Rx sent to preferred pharmacy.  Patient is aware and voiced her understanding.  Nothing further needed.

## 2023-01-09 NOTE — Telephone Encounter (Signed)
-----   Message from Pablo Ledger, [place] sent at 01/06/2023  8:50 AM EDT ----- Patient needs TOC call completed please.

## 2023-01-09 NOTE — Telephone Encounter (Signed)
error 

## 2023-01-09 NOTE — Transitions of Care (Post Inpatient/ED Visit) (Unsigned)
   01/09/2023  Name: Emily Johnston MRN: [id number] DOB: October 27, 1960  Today's TOC FU Call Status: Today's TOC FU Call Status:: Unsuccessul Call (1st Attempt)  Attempted to reach the patient regarding the most recent Inpatient/ED visit.  Follow Up Plan: Additional outreach attempts will be made to reach the patient to complete the Transitions of Care (Post Inpatient/ED visit) call.   Signature  [place], [facility]

## 2023-01-12 NOTE — Transitions of Care (Post Inpatient/ED Visit) (Unsigned)
   01/12/2023  Name: Emily Johnston MRN: [id number] DOB: 03/17/1961  Today's TOC FU Call Status: Today's TOC FU Call Status:: Unsuccessful Call (2nd Attempt) Unsuccessful Call (2nd Attempt) Date: 01/12/23  Attempted to reach the patient regarding the most recent Inpatient/ED visit.  Follow Up Plan: Additional outreach attempts will be made to reach the patient to complete the Transitions of Care (Post Inpatient/ED visit) call.   Signature Leward Quan, CMA, AAMA

## 2023-01-14 NOTE — Transitions of Care (Post Inpatient/ED Visit) (Signed)
   01/14/2023  Name: Emily Johnston MRN: [id number] DOB: 08/08/1961  Today's TOC FU Call Status: Today's TOC FU Call Status:: Unsuccessful Call (3rd Attempt) Unsuccessful Call (2nd Attempt) Date: 01/12/23 Unsuccessful Call (3rd Attempt) Date: 01/14/23  Attempted to reach the patient regarding the most recent Inpatient/ED visit.  Follow Up Plan: No further outreach attempts will be made at this time. We have been unable to contact the patient.  Signature [place], [facility]

## 2023-01-15 ENCOUNTER — Encounter: Payer: Self-pay | Admitting: Physician Assistant

## 2023-01-15 ENCOUNTER — Other Ambulatory Visit: Payer: Self-pay

## 2023-01-15 ENCOUNTER — Ambulatory Visit (INDEPENDENT_AMBULATORY_CARE_PROVIDER_SITE_OTHER): Payer: 59 | Admitting: Physician Assistant

## 2023-01-15 VITALS — BP 108/81 | HR 84 | Temp 98.8°F | Ht 64.0 in | Wt 130.8 lb

## 2023-01-15 DIAGNOSIS — R1013 Epigastric pain: Secondary | ICD-10-CM | POA: Diagnosis not present

## 2023-01-15 DIAGNOSIS — K921 Melena: Secondary | ICD-10-CM | POA: Diagnosis not present

## 2023-01-15 MED ORDER — NA SULFATE-K SULFATE-MG SULF 17.5-3.13-1.6 GM/177ML PO SOLN: 1.0000 | Freq: Once | ORAL | 0 refills | Status: AC

## 2023-01-15 NOTE — Progress Notes (Signed)
Emily Amy, PA-C [address]  [address]  [place], [place] [address]  Main: [phone]  Fax: [phone]   Gastroenterology Consultation  Referring Provider:     Marjie Skiff, NP Primary Care Physician:  Emily Skiff, NP Primary Gastroenterologist:  Dr. Midge Minium  Reason for Consultation:     Emily Johnston        HPI:   Emily Johnston is a 62 y.o. y/o female referred for consultation & management  by Emily Skiff, NP.    Was seen by her PCP at [facility] clinic internal medicine 01/01/2023 to evaluate black Johnston.  She was sent to the ED. patient states she had 5 black loose tarry bowel movements.  Acute onset.  She has also had some generalized upper abdominal pain, worse in the epigastrium.  She is worried about a stomach ulcer.  She denies aspirin or NSAID use.  Has not taken any Pepto-Bismol.  Denies alcohol use.  Johnston are back to brown color now.  She denies family history of esophageal or stomach cancer.  Feels weak and shaky.  Admits to mild shortness of breath.  Denies chest pain.  She was seen at [facility] 01/01/2023 for black Johnston/melena first a few days.  She was also having RUQ and flank pain.  Has history of COPD and took recent antibiotics for URI.  Chest x-ray showed chronic hyperinflation, COPD, no acute abnormality, no pneumonia.  Uses inhalers with benefit.  Labs 01/06/2023 showed normal CMP and CBC.  Hemoglobin 14.2, no anemia.  Platelets 223. CT abdomen pelvis with contrast 01/06/2023 showed no acute abnormality.  Incidental findings degenerative lumbar disc disease and hysterectomy.  Slight fold thickening along the mid body of the stomach.  Otherwise normal intestines.  Currently taking pantoprazole 40 Mg once daily and sucralfate 1 g 3 times daily.  No Previous EGD.  Patient states she had 1 normal screening colonoscopy at age 34.  She is due for 10-year repeat screening colonoscopy.  She denies family history of colon cancer or  bright red rectal bleeding.  Past Medical History:  Diagnosis Date   Anxiety    Arthritis    COPD (chronic obstructive pulmonary disease) (HCC)    Degenerative disc disease, cervical    Depression    Fibromyalgia    IBS (irritable bowel syndrome)    Psoriasis    PTSD (post-traumatic stress disorder)     Past Surgical History:  Procedure Laterality Date   ABDOMINAL HYSTERECTOMY     CARPAL TUNNEL RELEASE     KNEE SURGERY      Prior to Admission medications   Medication Sig Start Date End Date Taking? Authorizing Provider  acitretin (SORIATANE) 25 MG capsule Take 1 capsule by mouth daily. 04/30/20   [provider]  albuterol (VENTOLIN HFA) 108 (90 Base) MCG/ACT inhaler Inhale 2 puffs into the lungs every 6 (six) hours as needed for wheezing or shortness of breath. 09/04/20   Johnson, Megan P, DO  Cholecalciferol 1.25 MG (50000 UT) TABS Take 1 tablet by mouth once a week. 11/15/22   Cannady, Corrie Dandy T, NP  clobetasol ointment (TEMOVATE) 0.05 % Apply 1 application. topically 2 (two) times daily. 09/24/21   [provider]  clonazePAM (KLONOPIN) 0.5 MG tablet Take 1 tablet (0.5 mg total) by mouth 2 (two) times daily as needed for anxiety. 12/15/22   Cannady, Corrie Dandy T, NP  DULoxetine (CYMBALTA) 60 MG capsule Take 2 capsules (120 mg total) by mouth daily. 05/12/22  Cannady, [name] T, NP  fluticasone (FLONASE) 50 MCG/ACT nasal spray SPRAY 2 SPRAYS INTO EACH NOSTRIL EVERY DAY 12/16/22   Cannady, Corrie Dandy T, NP  Fluticasone-Umeclidin-Vilant (TRELEGY ELLIPTA) 200-62.5-25 MCG/ACT AEPB Inhale 1 puff into the lungs daily. 01/09/23   Salena Saner, MD  folic acid (FOLVITE) 1 MG tablet Take 1 mg by mouth daily.    [provider]  hydrocortisone 1 % ointment Apply 1 Application topically 2 (two) times daily. 11/23/22   Minna Antis, MD  lactobacillus acidophilus & bulgar (LACTINEX) chewable tablet Chew 1 tablet by mouth 3 (three) times daily with meals. 12/25/22   Cannady,  Corrie Dandy T, NP  meclizine (ANTIVERT) 12.5 MG tablet Take 1 tablet (12.5 mg total) by mouth 3 (three) times daily as needed for dizziness. 09/04/20   Johnson, Megan P, DO  montelukast (SINGULAIR) 10 MG tablet Take 1 tablet (10 mg total) by mouth at bedtime. 11/24/22   Aura Dials T, NP  Multiple Vitamin (MULTIVITAMIN) tablet Take 1 tablet by mouth daily.    [provider]  oxyCODONE-acetaminophen (PERCOCET) 5-325 MG tablet Take 1 tablet by mouth every 4 (four) hours as needed for severe pain. 01/06/23 01/06/24  Chesley Noon, MD  pantoprazole (PROTONIX) 40 MG tablet Take 1 tablet (40 mg total) by mouth daily. 01/06/23 04/06/23  Chesley Noon, MD  rosuvastatin (CRESTOR) 10 MG tablet Take 1 tablet (10 mg total) by mouth daily. 11/15/22   Cannady, Corrie Dandy T, NP  sucralfate (CARAFATE) 1 g tablet Take 1 tablet (1 g total) by mouth 4 (four) times daily. 01/01/23 01/01/24  Phineas Semen, MD    Family History  Problem Relation Age of Onset   Breast cancer Mother 80     Social History   Tobacco Use   Smoking status: Every Day    Packs/day: 1.00    Years: 41.00    Additional pack years: 0.00    Total pack years: 41.00    Types: Cigarettes   Smokeless tobacco: Never   Tobacco comments:    1 PPD - 12/23/2022 khj  Vaping Use   Vaping Use: Never used  Substance Use Topics   Alcohol use: No   Drug use: No    Allergies as of 01/15/2023   (No Known Allergies)    Review of Systems:    All systems reviewed and negative except where noted in HPI.   Physical Exam:  BP 108/81 (BP Location: Left Arm, Patient Position: Sitting, Cuff Size: Normal)   Pulse 84   Temp 98.8 F (37.1 C) (Oral)   Ht 5\' 4"  (1.626 m)   Wt 130 lb 12.8 oz (59.3 kg)   BMI 22.45 kg/m  No LMP recorded. Patient has had a hysterectomy. Psych:  Alert and cooperative. Normal mood and affect. General:   Alert,  Well-developed, well-nourished, pleasant and cooperative in NAD Head:  Normocephalic and atraumatic. Eyes:   Sclera clear, no icterus.   Conjunctiva pink. Ears:  Normal auditory acuity. Neck:  Supple; no masses or thyromegaly. Lungs:  Respirations even and unlabored.  Clear throughout to auscultation.   No wheezes, crackles, or rhonchi. No acute distress. Heart:  Regular rate and rhythm; no murmurs, clicks, rubs, or gallops. Abdomen:  Normal bowel sounds.  No bruits.  Soft, non-tender and non-distended without masses, hepatosplenomegaly or hernias noted.  No guarding or rebound tenderness.    Neurologic:  Alert and oriented x3;  grossly normal neurologically. Psych:  Alert and cooperative. Normal mood and affect.  Imaging Studies: CT ABDOMEN  PELVIS W CONTRAST  Result Date: 01/06/2023 CLINICAL DATA:  Abdominal pain and flank pain. History of prior ulcer disease. EXAM: CT ABDOMEN AND PELVIS WITH CONTRAST TECHNIQUE: Multidetector CT imaging of the abdomen and pelvis was performed using the standard protocol following bolus administration of intravenous contrast. RADIATION DOSE REDUCTION: This exam was performed according to the departmental dose-optimization program which includes automated exposure control, adjustment of the mA and/or kV according to patient size and/or use of iterative reconstruction technique. CONTRAST:  OMNIPAQUE IOHEXOL 300 MG/ML  SOLN COMPARISON:  CT abdomen pelvis 01/02/2022 FINDINGS: Lower chest: There is some linear opacity lung bases likely scar or atelectasis. Breathing motion. No pleural effusion at the lung bases. Hepatobiliary: No focal liver abnormality is seen. No gallstones, gallbladder wall thickening, or biliary dilatation. Pancreas: Unremarkable. No pancreatic ductal dilatation or surrounding inflammatory changes. Spleen: Normal in size without focal abnormality. Adrenals/Urinary Tract: Adrenal glands are preserved. No enhancing renal mass or collecting system dilatation. There is a small cyst along the medial aspect of the left kidney measuring 10 mm. No specific  imaging follow-up. Tiny focus on the right as well. Bosniak 1 lesion. This has also unchanged from previous when adjusting for technique. Preserved contour to the urinary bladder. Stomach/Bowel: Cecum resides in the central pelvis. Normal appendix extends superior to this. Mild scattered colonic stool. Stomach is nondilated. Slight fold thickening along the midbody of the stomach. Please correlate with the provided clinical history. Small bowel is nondilated. Atypical distribution of small bowel with some loops of small bowel extending lateral to the ascending colon. No obstruction or dilatation. Vascular/Lymphatic: Scattered vascular calcifications identified along the aorta and branch vessels. Normal caliber IVC. No specific abnormal lymph node enlargement identified in the abdomen and pelvis. Reproductive: Status post hysterectomy. No adnexal masses. Other: No abdominal wall hernia or abnormality. No abdominopelvic ascites. Musculoskeletal: Scattered degenerative changes of the spine and pelvis. There are some areas of disc bulging along the lumbar spine particularly L4-5. IMPRESSION: No bowel obstruction, free air or free fluid.  Normal appendix. Degenerative changes of the spine particularly at L4-5 with some stenosis. Electronically Signed   By: Karen Kays M.D.   On: 01/06/2023 16:41   DG Chest 2 View  Result Date: 01/01/2023 CLINICAL DATA:  Weakness and cough.  Congestion.  History of COPD. EXAM: CHEST - 2 VIEW COMPARISON:  Chest radiographs 07/31/2021 and 02/19/2017, CT chest 11/12/2022 FINDINGS: Cardiac silhouette and mediastinal contours are within normal limits. There is flattening of the diaphragms and moderate hyperinflation, unchanged. Increased lucencies within the upper lungs consistent with the centrilobular emphysematous changes seen on prior CT. Mild chronic diffuse bilateral interstitial thickening is unchanged from prior. No acute airspace opacity. No pleural effusion or pneumothorax.  Moderate multilevel degenerative disc changes of the thoracic spine with mild dextrocurvature of the lower thoracic spine. IMPRESSION: 1. No acute lung process. 2. Chronic hyperinflation and COPD. Electronically Signed   By: Neita Garnet M.D.   On: 01/01/2023 14:23    Assessment and Plan:   AARYANNA HYDEN is a 62 y.o. y/o female has been referred for Melena.  Ddx: includes peptic ulcer, gastritis, and H. pylori.  She denies aspirin, NSAIDs, alcohol, or Pepto-Bismol use.  Reassurance regarding recent abdominal pelvic CT which showed no acute abnormality.  Lab 01/06/2023 showed normal hemoglobin 14 g.  She reports having 1 negative screening colonoscopy done at age 43.  Is due for 10-year repeat screening colonoscopy.  Melena  Scheduling EGD and Colonscopy  I  have discussed risks & benefits of the procedure  which include, but are not limited to, bleeding, infection, perforation,respiratory compromise & drug reaction.  The patient agrees with this plan & written consent will be obtained.     Continue pantoprazole 40 Mg once daily for now.  She is advised to avoid all NSAIDs and alcohol.  She denies NSAID or alcohol use.  She denies use of Pepto-Bismol.  Lab: Check CBC to screen for anemia.   Epigastric Pain Scheduling EGD  3.  Colon Cancer Screening  Scheduling Colonoscopy  Follow up in 2 to 3 weeks after EGD and colonoscopy procedures.  Emily Amy, PA-C

## 2023-01-16 LAB — CBC WITH DIFFERENTIAL
Basophils Absolute: 0.1 10*3/uL (ref 0.0–0.2)
Basos: 1 %
EOS (ABSOLUTE): 0.1 10*3/uL (ref 0.0–0.4)
Eos: 1 %
Hematocrit: 39.9 % (ref 34.0–46.6)
Hemoglobin: 13.2 g/dL (ref 11.1–15.9)
Immature Grans (Abs): 0 10*3/uL (ref 0.0–0.1)
Immature Granulocytes: 0 %
Lymphocytes Absolute: 2.4 10*3/uL (ref 0.7–3.1)
Lymphs: 35 %
MCH: 31.4 pg (ref 26.6–33.0)
MCHC: 33.1 g/dL (ref 31.5–35.7)
MCV: 95 fL (ref 79–97)
Monocytes Absolute: 0.6 10*3/uL (ref 0.1–0.9)
Monocytes: 9 %
Neutrophils Absolute: 3.7 10*3/uL (ref 1.4–7.0)
Neutrophils: 54 %
RBC: 4.21 x10E6/uL (ref 3.77–5.28)
RDW: 13.2 % (ref 11.7–15.4)
WBC: 6.8 10*3/uL (ref 3.4–10.8)

## 2023-01-20 ENCOUNTER — Telehealth: Payer: Self-pay

## 2023-01-20 ENCOUNTER — Encounter: Payer: Self-pay | Admitting: Nurse Practitioner

## 2023-01-20 NOTE — Telephone Encounter (Signed)
Notify patient CBC is normal.  White count and hemoglobin are normal.  No evidence of infection or anemia.  Continue with current plan.   Patient notifed of results.

## 2023-01-20 NOTE — Telephone Encounter (Signed)
Error

## 2023-01-21 ENCOUNTER — Encounter: Payer: Self-pay | Admitting: Nurse Practitioner

## 2023-01-21 MED ORDER — CLONAZEPAM 0.5 MG PO TABS: 0.5000 mg | ORAL_TABLET | Freq: Two times a day (BID) | ORAL | 0 refills | Status: DC | PRN

## 2023-01-26 ENCOUNTER — Ambulatory Visit: Payer: 59 | Admitting: Physician Assistant

## 2023-01-29 MED ORDER — LORAZEPAM 1 MG TABLET
ORAL_TABLET | 0 refills | 0 days | Status: CP
Start: 2023-01-29 — End: ?

## 2023-02-02 ENCOUNTER — Ambulatory Visit: Admit: 2023-02-02 | Discharge: 2023-02-03 | Payer: MEDICARE

## 2023-02-04 ENCOUNTER — Encounter: Payer: Self-pay | Admitting: Gastroenterology

## 2023-02-05 ENCOUNTER — Telehealth: Payer: Self-pay

## 2023-02-05 MED ORDER — PROPOFOL 1000 MG/100ML IV EMUL
INTRAVENOUS | Status: AC
  Filled 2023-02-05: qty 300

## 2023-02-05 MED ORDER — LIDOCAINE HCL (PF) 2 % IJ SOLN
INTRAMUSCULAR | Status: AC
  Filled 2023-02-05: qty 15

## 2023-02-05 MED ORDER — GLYCOPYRROLATE 0.2 MG/ML IJ SOLN
INTRAMUSCULAR | Status: AC
  Filled 2023-02-05: qty 1

## 2023-02-05 NOTE — Telephone Encounter (Signed)
Spoken to patient and noticed that there was an error on the instructions. It had one day clear liquid on 02/05/2023 and patient did not turn back to check on the previous page that procedure day was 02/05/2023.  Patient apologize and I have apologize as well. Patient kept crying.   I tried to move her to [facility] so it will be with Dr Servando Snare but unable to contact anyone.  So I had patient moved to Dr Johnney Killian schedule for Friday 02/06/2023. Gave the patient the arrival time.  Patient verbalized understanding.

## 2023-02-05 NOTE — Telephone Encounter (Signed)
Per pt is very upset she states she didn't understand the instruction and missed her procedure today. I think Ginger scheduled pt but due to her being out of office I'm sending it to the team. Pt want to know when can she be r/s and will someone explain to her on what day to start. Pt states she now have a stomachache.

## 2023-02-06 ENCOUNTER — Emergency Department
Admission: EM | Admit: 2023-02-06 | Discharge: 2023-02-06 | Disposition: A | Discharge status: Home / Self Care | Payer: 59 | Source: Home / Self Care | Attending: Student in an Organized Health Care Education/Training Program | Admitting: Student in an Organized Health Care Education/Training Program

## 2023-02-06 ENCOUNTER — Ambulatory Visit: Payer: 59 | Admitting: Certified Registered Nurse Anesthetist

## 2023-02-06 ENCOUNTER — Encounter
Admission: RE | Disposition: A | Discharge status: Home / Self Care | Payer: Self-pay | Source: Home / Self Care | Attending: Gastroenterology

## 2023-02-06 ENCOUNTER — Other Ambulatory Visit: Payer: Self-pay

## 2023-02-06 ENCOUNTER — Ambulatory Visit
Admission: RE | Admit: 2023-02-06 | Discharge: 2023-02-06 | Disposition: A | Discharge status: Home / Self Care | Payer: 59 | Attending: Gastroenterology | Admitting: Gastroenterology

## 2023-02-06 ENCOUNTER — Encounter: Payer: Self-pay | Admitting: Gastroenterology

## 2023-02-06 ENCOUNTER — Emergency Department: Payer: 59

## 2023-02-06 DIAGNOSIS — D126 Benign neoplasm of colon, unspecified: Secondary | ICD-10-CM

## 2023-02-06 DIAGNOSIS — K922 Gastrointestinal hemorrhage, unspecified: Secondary | ICD-10-CM

## 2023-02-06 DIAGNOSIS — D123 Benign neoplasm of transverse colon: Secondary | ICD-10-CM | POA: Insufficient documentation

## 2023-02-06 DIAGNOSIS — Z1211 Encounter for screening for malignant neoplasm of colon: Secondary | ICD-10-CM | POA: Diagnosis present

## 2023-02-06 DIAGNOSIS — D122 Benign neoplasm of ascending colon: Secondary | ICD-10-CM | POA: Insufficient documentation

## 2023-02-06 DIAGNOSIS — J449 Chronic obstructive pulmonary disease, unspecified: Secondary | ICD-10-CM | POA: Diagnosis not present

## 2023-02-06 DIAGNOSIS — F419 Anxiety disorder, unspecified: Secondary | ICD-10-CM | POA: Diagnosis not present

## 2023-02-06 DIAGNOSIS — K921 Melena: Secondary | ICD-10-CM | POA: Insufficient documentation

## 2023-02-06 DIAGNOSIS — D120 Benign neoplasm of cecum: Secondary | ICD-10-CM | POA: Insufficient documentation

## 2023-02-06 DIAGNOSIS — R1013 Epigastric pain: Secondary | ICD-10-CM

## 2023-02-06 DIAGNOSIS — F17210 Nicotine dependence, cigarettes, uncomplicated: Secondary | ICD-10-CM | POA: Diagnosis not present

## 2023-02-06 DIAGNOSIS — K317 Polyp of stomach and duodenum: Secondary | ICD-10-CM | POA: Diagnosis not present

## 2023-02-06 HISTORY — DX: Hyperlipidemia, unspecified: E78.5

## 2023-02-06 HISTORY — PX: COLONOSCOPY WITH PROPOFOL: SHX5780

## 2023-02-06 HISTORY — DX: Gastro-esophageal reflux disease without esophagitis: K21.9

## 2023-02-06 HISTORY — PX: ESOPHAGOGASTRODUODENOSCOPY (EGD) WITH PROPOFOL: SHX5813

## 2023-02-06 LAB — COMPREHENSIVE METABOLIC PANEL
ALT: 12 U/L (ref 0–44)
AST: 23 U/L (ref 15–41)
Albumin: 3.8 g/dL (ref 3.5–5.0)
Alkaline Phosphatase: 85 U/L (ref 38–126)
Anion gap: 8 (ref 5–15)
BUN: 7 mg/dL — ABNORMAL LOW (ref 8–23)
CO2: 27 mmol/L (ref 22–32)
Calcium: 8.8 mg/dL — ABNORMAL LOW (ref 8.9–10.3)
Chloride: 105 mmol/L (ref 98–111)
Creatinine, Ser: 0.79 mg/dL (ref 0.44–1.00)
GFR, Estimated: >60 mL/min (ref >60)
Glucose, Bld: 111 mg/dL — ABNORMAL HIGH (ref 70–99)
Potassium: 3.1 mmol/L — ABNORMAL LOW (ref 3.5–5.1)
Sodium: 140 mmol/L (ref 135–145)
Total Bilirubin: 0.5 mg/dL (ref 0.3–1.2)
Total Protein: 6.7 g/dL (ref 6.5–8.1)

## 2023-02-06 LAB — CBC
HCT: 39.5 % (ref 36.0–46.0)
Hemoglobin: 13.2 g/dL (ref 12.0–15.0)
MCH: 31.6 pg (ref 26.0–34.0)
MCHC: 33.4 g/dL (ref 30.0–36.0)
MCV: 94.5 fL (ref 80.0–100.0)
Platelets: 184 10*3/uL (ref 150–400)
RBC: 4.18 MIL/uL (ref 3.87–5.11)
RDW: 13.2 % (ref 11.5–15.5)
WBC: 6.7 10*3/uL (ref 4.0–10.5)
nRBC: 0 % (ref 0.0–0.2)

## 2023-02-06 LAB — TYPE AND SCREEN
ABO/RH(D): A POS
Antibody Screen: NEGATIVE

## 2023-02-06 SURGERY — COLONOSCOPY WITH PROPOFOL
Anesthesia: General

## 2023-02-06 MED ORDER — LIDOCAINE HCL (CARDIAC) PF 100 MG/5ML IV SOSY
PREFILLED_SYRINGE | INTRAVENOUS | Status: DC | PRN
  Administered 2023-02-06: 50 mg via INTRAVENOUS

## 2023-02-06 MED ORDER — PROPOFOL 500 MG/50ML IV EMUL
INTRAVENOUS | Status: DC | PRN
  Administered 2023-02-06: 125 ug/kg/min via INTRAVENOUS

## 2023-02-06 MED ORDER — SODIUM CHLORIDE 0.9 % IV SOLN: INTRAVENOUS | Status: DC

## 2023-02-06 MED ORDER — GLYCOPYRROLATE 0.2 MG/ML IJ SOLN
INTRAMUSCULAR | Status: DC | PRN
  Administered 2023-02-06: .1 mg via INTRAVENOUS

## 2023-02-06 MED ORDER — LIDOCAINE HCL (PF) 2 % IJ SOLN
INTRAMUSCULAR | Status: AC
  Filled 2023-02-06: qty 5

## 2023-02-06 MED ORDER — IOHEXOL 300 MG/ML  SOLN
100.0000 mL | Freq: Once | INTRAMUSCULAR | Status: AC | PRN
  Administered 2023-02-06: 100 mL via INTRAVENOUS

## 2023-02-06 MED ORDER — GLYCOPYRROLATE 0.2 MG/ML IJ SOLN
INTRAMUSCULAR | Status: AC
  Filled 2023-02-06: qty 1

## 2023-02-06 MED ORDER — MORPHINE SULFATE (PF) 4 MG/ML IV SOLN
4.0000 mg | INTRAVENOUS | Status: DC | PRN
  Administered 2023-02-06: 4 mg via INTRAVENOUS
  Filled 2023-02-06: qty 1

## 2023-02-06 MED ORDER — ONDANSETRON HCL 4 MG/2ML IJ SOLN
4.0000 mg | Freq: Once | INTRAMUSCULAR | Status: AC
  Administered 2023-02-06: 4 mg via INTRAVENOUS
  Filled 2023-02-06: qty 2

## 2023-02-06 MED ORDER — PROPOFOL 10 MG/ML IV BOLUS
INTRAVENOUS | Status: DC | PRN
  Administered 2023-02-06: 80 mg via INTRAVENOUS
  Administered 2023-02-06 (×2): 20 mg via INTRAVENOUS

## 2023-02-06 MED ORDER — PROPOFOL 10 MG/ML IV BOLUS
INTRAVENOUS | Status: AC
  Filled 2023-02-06: qty 20

## 2023-02-06 NOTE — Anesthesia Postprocedure Evaluation (Signed)
Anesthesia Post Note  Patient: Emily Johnston  Procedure(s) Performed: COLONOSCOPY WITH PROPOFOL ESOPHAGOGASTRODUODENOSCOPY (EGD) WITH PROPOFOL  Patient location during evaluation: PACU Anesthesia Type: General Level of consciousness: awake and awake and alert Pain management: pain level controlled Vital Signs Assessment: post-procedure vital signs reviewed and stable Respiratory status: spontaneous breathing and nonlabored ventilation Cardiovascular status: stable Anesthetic complications: no   No notable events documented.   Last Vitals:  Vitals:   02/06/23 0947 02/06/23 1055  BP: (!) 127/92 104/74  Pulse: 88   Resp: 19 16  Temp: (!) 36.2 C (!) 35.6 C  SpO2: 98% 99%    Last Pain:  Vitals:   02/06/23 1055  TempSrc: Temporal  PainSc: 0-No pain                 VAN STAVEREN,Carnell Beavers

## 2023-02-06 NOTE — Op Note (Signed)
[facility] Gastroenterology Patient Name: Emily Johnston Procedure Date: 02/06/2023 10:04 AM MRN: [id number] Account #: [id number] Date of Birth: [birth year] Admit Type: Outpatient Age: 62 Room: [facility] ENDO ROOM 4 Gender: Female Note Status: Finalized Instrument Name: Nelda Marseille [id number] Procedure:             Colonoscopy Indications:           Screening for colorectal malignant neoplasm Providers:             Wyline Mood MD, MD Referring MD:          Midge Minium MD, MD (Referring MD) Medicines:             Monitored Anesthesia Care Complications:         No immediate complications. Procedure:             Pre-Anesthesia Assessment:                        - Prior to the procedure, a History and Physical was                         performed, and patient medications, allergies and                         sensitivities were reviewed. The patient's tolerance                         of previous anesthesia was reviewed.                        - The risks and benefits of the procedure and the                         sedation options and risks were discussed with the                         patient. All questions were answered and informed                         consent was obtained.                        - ASA Grade Assessment: II - A patient with mild                         systemic disease.                        After obtaining informed consent, the colonoscope was                         passed under direct vision. Throughout the procedure,                         the patient's blood pressure, pulse, and oxygen                         saturations were monitored continuously. The                         Colonoscope was  introduced through the anus and                         advanced to the the cecum, identified by the                         appendiceal orifice. The colonoscopy was performed                         with ease. The patient tolerated the procedure  well.                         The quality of the bowel preparation was good. The                         ileocecal valve, appendiceal orifice, and rectum were                         photographed. Findings:      The perianal and digital rectal examinations were normal.      A 3 mm polyp was found in the cecum. The polyp was sessile. The polyp       was removed with a cold biopsy forceps. Resection and retrieval were       complete. To prevent bleeding after the polypectomy, one hemostatic clip       was successfully placed. Clip manufacturer: [employer]. There was       no bleeding at the end of the procedure.      Four sessile polyps were found in the ascending colon. The polyps were 6       to 8 mm in size. These polyps were removed with a cold snare. Resection       and retrieval were complete.      A 3 mm polyp was found in the ascending colon. The polyp was sessile.       The polyp was removed with a cold biopsy forceps. Resection and       retrieval were complete.      Four sessile polyps were found in the transverse colon. The polyps were       5 to 7 mm in size. These polyps were removed with a cold snare.       Resection and retrieval were complete.      Two sessile polyps were found in the transverse colon. The polyps were       16 to 18 mm in size. Estimated blood loss: none. Preparations were made       for mucosal resection. Demarcation of the lesion was performed with       narrow band imaging to clearly identify the boundaries of the lesion.       Saline was injected to raise the lesion. Snare mucosal resection was       performed. Resection and retrieval were complete. Resected tissue       margins were examined and clear of polyp tissue. To prevent bleeding       after mucosal resection, three hemostatic clips were successfully placed       (MR conditional). Clip manufacturer: [employer]. There was no       bleeding at the end of the procedure.      The  exam was otherwise without abnormality on direct  and retroflexion       views. Impression:            - One 3 mm polyp in the cecum, removed with a cold                         biopsy forceps. Resected and retrieved. Clip was                         placed. Clip manufacturer: [employer].                        - Four 6 to 8 mm polyps in the ascending colon,                         removed with a cold snare. Resected and retrieved.                        - One 3 mm polyp in the ascending colon, removed with                         a cold biopsy forceps. Resected and retrieved.                        - Four 5 to 7 mm polyps in the transverse colon,                         removed with a cold snare. Resected and retrieved.                        - Two 16 to 18 mm polyps in the transverse colon,                         removed with mucosal resection. Resected and                         retrieved. Clips (MR conditional) were placed. Clip                         manufacturer: [employer].                        - The examination was otherwise normal on direct and                         retroflexion views.                        - Mucosal resection was performed. Resection and                         retrieval were complete. Recommendation:        - Discharge patient to home (with escort).                        - Resume previous diet.                        - Continue present medications.                        -  Await pathology results.                        - Repeat colonoscopy in 1 year for surveillance based                         on pathology results. Procedure Code(s):     --- Professional ---                        [phone], Colonoscopy, flexible; with endoscopic mucosal                         resection                        [phone], 59, Colonoscopy, flexible; with removal of                         tumor(s), polyp(s), or other lesion(s) by snare                          technique                        [id number], 59, Colonoscopy, flexible; with biopsy, single                         or multiple Diagnosis Code(s):     --- Professional ---                        Z12.11, Encounter for screening for malignant neoplasm                         of colon                        D12.0, Benign neoplasm of cecum                        D12.2, Benign neoplasm of ascending colon                        D12.3, Benign neoplasm of transverse colon (hepatic                         flexure or splenic flexure) CPT copyright 2022 American Medical Association. All rights reserved. The codes documented in this report are preliminary and upon coder review may  be revised to meet current compliance requirements. Wyline Mood, MD Wyline Mood MD, MD 02/06/2023 10:54:57 AM This report has been signed electronically. Number of Addenda: 0 Note Initiated On: 02/06/2023 10:04 AM Scope Withdrawal Time: 0 hours 27 minutes 14 seconds  Total Procedure Duration: 0 hours 28 minutes 59 seconds  Estimated Blood Loss:  Estimated blood loss: none.      [facility]

## 2023-02-06 NOTE — Op Note (Signed)
[facility] Gastroenterology Patient Name: Emily Johnston Procedure Date: 02/06/2023 10:04 AM MRN: [id number] Account #: [id number] Date of Birth: 09-26-1960 Admit Type: Outpatient Age: 62 Room: [facility] ENDO ROOM 4 Gender: Female Note Status: Finalized Instrument Name: Patton Salles Endoscope [id number] Procedure:             Upper GI endoscopy Indications:           Melena Providers:             Wyline Mood MD, MD Referring MD:          Midge Minium MD, MD (Referring MD) Medicines:             Monitored Anesthesia Care Complications:         No immediate complications. Procedure:             Pre-Anesthesia Assessment:                        - Prior to the procedure, a History and Physical was                         performed, and patient medications, allergies and                         sensitivities were reviewed. The patient's tolerance                         of previous anesthesia was reviewed.                        - The risks and benefits of the procedure and the                         sedation options and risks were discussed with the                         patient. All questions were answered and informed                         consent was obtained.                        - ASA Grade Assessment: II - A patient with mild                         systemic disease.                        After obtaining informed consent, the endoscope was                         passed under direct vision. Throughout the procedure,                         the patient's blood pressure, pulse, and oxygen                         saturations were monitored continuously. The Endoscope                         was introduced  through the mouth, and advanced to the                         third part of duodenum. The upper GI endoscopy was                         accomplished with ease. The patient tolerated the                         procedure well. Findings:      The esophagus was  normal.      The gastroesophageal flap valve was visualized endoscopically and       classified as Hill Grade III (minimal fold, loose to endoscope, hiatal       hernia likely).      The cardia and gastric fundus were normal on retroflexion.      The stomach was normal.      A single 5 mm sessile polyp was found in the duodenal bulb. The polyp       was removed with a cold biopsy forceps. Resection and retrieval were       complete. Impression:            - Normal esophagus.                        - Gastroesophageal flap valve classified as Hill Grade                         III (minimal fold, loose to endoscope, hiatal hernia                         likely).                        - Normal stomach.                        - A single duodenal polyp. Resected and retrieved. Recommendation:        - Await pathology results.                        - Perform a colonoscopy today. Procedure Code(s):     --- Professional ---                        [phone], Esophagogastroduodenoscopy, flexible,                         transoral; with biopsy, single or multiple Diagnosis Code(s):     --- Professional ---                        K31.7, Polyp of stomach and duodenum                        K92.1, Melena (includes Hematochezia) CPT copyright 2022 American Medical Association. All rights reserved. The codes documented in this report are preliminary and upon coder review may  be revised to meet current compliance requirements. Wyline Mood, MD Wyline Mood MD, MD 02/06/2023 10:21:29 AM This report has been signed electronically. Number of Addenda: 0 Note Initiated On: 02/06/2023 10:04 AM Estimated Blood  Loss:  Estimated blood loss: none.      [facility]

## 2023-02-06 NOTE — H&P (Signed)
Wyline Mood, MD [address], [address], [place], [place], [address] [address], [place], [place], [address] Phone: [phone]  Fax: [phone]  Primary Care Physician:  Marjie Skiff, NP   Pre-Procedure History & Physical: HPI:  Emily Johnston is a 62 y.o. female is here for an endoscopy and colonoscopy    Past Medical History:  Diagnosis Date   Anxiety    Arthritis    COPD (chronic obstructive pulmonary disease) (HCC)    Degenerative disc disease, cervical    Depression    Fibromyalgia    IBS (irritable bowel syndrome)    Psoriasis    PTSD (post-traumatic stress disorder)     Past Surgical History:  Procedure Laterality Date   ABDOMINAL HYSTERECTOMY     CARPAL TUNNEL RELEASE     KNEE SURGERY      Prior to Admission medications   Medication Sig Start Date End Date Taking? Authorizing Provider  acitretin (SORIATANE) 25 MG capsule Take 1 capsule by mouth daily. 04/30/20   [provider]  albuterol (VENTOLIN HFA) 108 (90 Base) MCG/ACT inhaler Inhale 2 puffs into the lungs every 6 (six) hours as needed for wheezing or shortness of breath. 09/04/20   Johnson, Megan P, DO  Cholecalciferol 1.25 MG (50000 UT) TABS Take 1 tablet by mouth once a week. 11/15/22   Cannady, Corrie Dandy T, NP  clobetasol ointment (TEMOVATE) 0.05 % Apply 1 application. topically 2 (two) times daily. 09/24/21   [provider]  clonazePAM (KLONOPIN) 0.5 MG tablet Take 1 tablet (0.5 mg total) by mouth 2 (two) times daily as needed for anxiety. 01/21/23   Cannady, Corrie Dandy T, NP  DULoxetine (CYMBALTA) 60 MG capsule Take 2 capsules (120 mg total) by mouth daily. 05/12/22   Cannady, Corrie Dandy T, NP  fluticasone (FLONASE) 50 MCG/ACT nasal spray SPRAY 2 SPRAYS INTO EACH NOSTRIL EVERY DAY 12/16/22   Cannady, Corrie Dandy T, NP  Fluticasone-Umeclidin-Vilant (TRELEGY ELLIPTA) 200-62.5-25 MCG/ACT AEPB Inhale 1 puff into the lungs daily. 01/09/23   Salena Saner, MD  folic acid  (FOLVITE) 1 MG tablet Take 1 mg by mouth daily.    [provider]  hydrocortisone 1 % ointment Apply 1 Application topically 2 (two) times daily. 11/23/22   Minna Antis, MD  lactobacillus acidophilus & bulgar (LACTINEX) chewable tablet Chew 1 tablet by mouth 3 (three) times daily with meals. 12/25/22   Cannady, Corrie Dandy T, NP  meclizine (ANTIVERT) 12.5 MG tablet Take 1 tablet (12.5 mg total) by mouth 3 (three) times daily as needed for dizziness. 09/04/20   Johnson, Megan P, DO  montelukast (SINGULAIR) 10 MG tablet Take 1 tablet (10 mg total) by mouth at bedtime. 11/24/22   Aura Dials T, NP  Multiple Vitamin (MULTIVITAMIN) tablet Take 1 tablet by mouth daily.    [provider]  oxyCODONE-acetaminophen (PERCOCET) 5-325 MG tablet Take 1 tablet by mouth every 4 (four) hours as needed for severe pain. Patient not taking: Reported on 01/15/2023 01/06/23 01/06/24  Chesley Noon, MD  pantoprazole (PROTONIX) 40 MG tablet Take 1 tablet (40 mg total) by mouth daily. 01/06/23 04/06/23  Chesley Noon, MD  rosuvastatin (CRESTOR) 10 MG tablet Take 1 tablet (10 mg total) by mouth daily. 11/15/22   Cannady, Corrie Dandy T, NP  sucralfate (CARAFATE) 1 g tablet Take 1 tablet (1 g total) by mouth 4 (four) times daily. 01/01/23 01/01/24  Phineas Semen, MD  tacrolimus (PROTOPIC) 0.1 % ointment 2 (two) times daily. 01/10/23  [provider]    Allergies as of 01/15/2023   (No Known Allergies)    Family History  Problem Relation Age of Onset   Breast cancer Mother 5    Social History   Socioeconomic History   Marital status: Legally Separated    Spouse name: Not on file   Number of children: Not on file   Years of education: Not on file   Highest education level: Not on file  Occupational History   Not on file  Tobacco Use   Smoking status: Every Day    Packs/day: 1.00    Years: 41.00    Additional pack years: 0.00    Total pack years: 41.00    Types: Cigarettes    Smokeless tobacco: Never   Tobacco comments:    1 PPD - 12/23/2022 khj  Vaping Use   Vaping Use: Never used  Substance and Sexual Activity   Alcohol use: No   Drug use: No   Sexual activity: Not on file  Other Topics Concern   Not on file  Social History Narrative   Not on file   Social Determinants of Health   Financial Resource Strain: Low Risk  (11/13/2022)   Overall Financial Resource Strain (CARDIA)    Difficulty of Paying Living Expenses: Not hard at all  Food Insecurity: No Food Insecurity (11/13/2022)   Hunger Vital Sign    Worried About Running Out of Food in the Last Year: Never true    Ran Out of Food in the Last Year: Never true  Transportation Needs: No Transportation Needs (11/13/2022)   PRAPARE - Administrator, Civil Service (Medical): No    Lack of Transportation (Non-Medical): No  Physical Activity: Insufficiently Active (11/13/2022)   Exercise Vital Sign    Days of Exercise per Week: 2 days    Minutes of Exercise per Session: 20 min  Stress: Stress Concern Present (11/13/2022)   [employer] of Occupational Health - Occupational Stress Questionnaire    Feeling of Stress : Rather much  Social Connections: Socially Isolated (11/13/2022)   Social Connection and Isolation Panel [NHANES]    Frequency of Communication with Friends and Family: More than three times a week    Frequency of Social Gatherings with Friends and Family: More than three times a week    Attends Religious Services: Never    Database administrator or Organizations: No    Attends Banker Meetings: Never    Marital Status: Never married  Intimate Partner Violence: Not At Risk (11/13/2022)   Humiliation, Afraid, Rape, and Kick questionnaire    Fear of Current or Ex-Partner: No    Emotionally Abused: No    Physically Abused: No    Sexually Abused: No    Review of Systems: See HPI, otherwise negative ROS  Physical Exam: There were no vitals taken for this  visit. General:   Alert,  pleasant and cooperative in NAD Head:  Normocephalic and atraumatic. Neck:  Supple; no masses or thyromegaly. Lungs:  Clear throughout to auscultation, normal respiratory effort.    Heart:  +S1, +S2, Regular rate and rhythm, No edema. Abdomen:  Soft, nontender and nondistended. Normal bowel sounds, without guarding, and without rebound.   Neurologic:  Alert and  oriented x4;  grossly normal neurologically.  Impression/Plan: CHANCY IXTA is here for an endoscopy and colonoscopy  to be performed for  evaluation of melena and colon cancer screening     Risks, benefits, limitations,  and alternatives regarding endoscopy have been reviewed with the patient.  Questions have been answered.  All parties agreeable.   Wyline Mood, MD  02/06/2023, 9:36 AM

## 2023-02-06 NOTE — Transfer of Care (Signed)
Immediate Anesthesia Transfer of Care Note  Patient: Emily Johnston  Procedure(s) Performed: COLONOSCOPY WITH PROPOFOL ESOPHAGOGASTRODUODENOSCOPY (EGD) WITH PROPOFOL  Patient Location: PACU and Endoscopy Unit  Anesthesia Type:General  Level of Consciousness: awake and patient cooperative  Airway & Oxygen Therapy: Patient Spontanous Breathing  Post-op Assessment: Report given to RN and Post -op Vital signs reviewed and stable  Post vital signs: Reviewed and stable  Last Vitals:  Vitals Value Taken Time  BP 104/74 02/06/23 1056  Temp    Pulse 73 02/06/23 1057  Resp 19 02/06/23 1057  SpO2 99 % 02/06/23 1057  Vitals shown include unvalidated device data.  Last Pain:  Vitals:   02/06/23 0947  TempSrc: Temporal  PainSc: 0-No pain         Complications: No notable events documented.

## 2023-02-06 NOTE — ED Provider Notes (Signed)
[facility] Provider Note    Event Date/Time   First MD Initiated Contact with Patient 02/06/23 1337     (approximate)   History   Post-op Problem and Rectal Bleeding   HPI  Emily Johnston is a 62 y.o. female presents to the ER for evaluation of passing blood clot as well as bloody bowel movement occurred shortly after returning home after colonoscopy and endoscopy today.  She is not on any anticoagulations or antiplatelets.  Is having some generalized crampy and burning up abdominal pain.  No syncope no melena.  Reportedly had multiple polyps found and several were removed.     Physical Exam   Triage Vital Signs: ED Triage Vitals  Enc Vitals Group     BP 02/06/23 1253 (!) 125/107     Pulse Rate 02/06/23 1253 75     Resp 02/06/23 1253 16     Temp 02/06/23 1253 98.2 F (36.8 C)     Temp Source 02/06/23 1253 Oral     SpO2 02/06/23 1253 94 %     Weight 02/06/23 1250 127 lb (57.6 kg)     Height 02/06/23 1250 5\' 4"  (1.626 m)     Head Circumference --      Peak Flow --      Pain Score 02/06/23 1249 7     Pain Loc --      Pain Edu? --      Excl. in GC? --     Most recent vital signs: Vitals:   02/06/23 1253  BP: (!) 125/107  Pulse: 75  Resp: 16  Temp: 98.2 F (36.8 C)  SpO2: 94%     Constitutional: Alert  Eyes: Conjunctivae are normal.  Head: Atraumatic. Nose: No congestion/rhinnorhea. Mouth/Throat: Mucous membranes are moist.   Neck: Painless ROM.  Cardiovascular:   Good peripheral circulation. Respiratory: Normal respiratory effort.  No retractions.  Gastrointestinal: Soft and nontender.  Musculoskeletal:  no deformity Neurologic:  MAE spontaneously. No gross focal neurologic deficits are appreciated.  Skin:  Skin is warm, dry and intact. No rash noted. Psychiatric: Mood and affect are normal. Speech and behavior are normal.    ED Results / Procedures / Treatments   Labs (all labs ordered are listed, but only abnormal  results are displayed) Labs Reviewed  COMPREHENSIVE METABOLIC PANEL - Abnormal; Notable for the following components:      Result Value   Potassium 3.1 (*)    Glucose, Bld 111 (*)    BUN 7 (*)    Calcium 8.8 (*)    All other components within normal limits  CBC  POC OCCULT BLOOD, ED  TYPE AND SCREEN     EKG     RADIOLOGY Please see ED Course for my review and interpretation.  I personally reviewed all radiographic images ordered to evaluate for the above acute complaints and reviewed radiology reports and findings.  These findings were personally discussed with the patient.  Please see medical record for radiology report.    PROCEDURES:  Critical Care performed: No  Procedures   MEDICATIONS ORDERED IN ED: Medications  morphine (PF) 4 MG/ML injection 4 mg (4 mg Intravenous Given 02/06/23 1359)  ondansetron (ZOFRAN) injection 4 mg (4 mg Intravenous Given 02/06/23 1359)  iohexol (OMNIPAQUE) 300 MG/ML solution 100 mL (100 mLs Intravenous Contrast Given 02/06/23 1430)     IMPRESSION / MDM / ASSESSMENT AND PLAN / ED COURSE  I reviewed the triage vital signs and the nursing notes.  Differential diagnosis includes, but is not limited to, postoperative hemorrhage, anemia, electrolyte abnormality, perforation,  Patient presenting to the ER for evaluation of symptoms as described above.  Based on symptoms, risk factors and considered above differential, this presenting complaint could reflect a potentially life-threatening illness therefore the patient will be placed on continuous pulse oximetry and telemetry for monitoring.  Laboratory evaluation will be sent to evaluate for the above complaints.     Clinical Course as of 02/06/23 1532  Fri Feb 06, 2023  1350 Case discussed in consultation with Dr. Tobi Bastos.  Given the minimal amount of bleeding plan will be to observe here in the ER will order CT imaging to further evaluate for postoperative  complication of perforation though less likely given her presentation but given her pain [PR]  1509 CT imaging on my review and interpretation does not show any evidence of perforation.  Will await formal radiology report. [PR]  1525 Patient reassessed.  Remains well-appearing hemodynamically stable.  Has not had any more episodes of bloody stool.  Given reassuring workup well appearance do think that the amount of bleeding would be expected given her colonoscopy results will be appropriate for close outpatient follow-up with GI.  We discussed strict return precautions.  Patient agreeable plan. [PR]    Clinical Course User Index [PR] Willy Eddy, MD     FINAL CLINICAL IMPRESSION(S) / ED DIAGNOSES   Final diagnoses:  Gastrointestinal hemorrhage, unspecified gastrointestinal hemorrhage type     Rx / DC Orders   ED Discharge Orders     None        Note:  This document was prepared using Dragon voice recognition software and may include unintentional dictation errors.    Willy Eddy, MD 02/06/23 [phone]

## 2023-02-06 NOTE — ED Notes (Signed)
Pt teaching provided on medications that may cause drowsiness. Pt instructed not to drive or operate heavy machinery while taking the prescribed medication. Pt verbalized understanding.   Pt provided discharge instructions and prescription information. Pt was given the opportunity to ask questions and questions were answered.   

## 2023-02-06 NOTE — ED Triage Notes (Signed)
Pt to ED after colonoscopy and endoscopy today for post procedure bleeding. 13 polyps found. States had quarter size come out of rectum today. Pt denies dizziness. Has been gassy also and having generalized abdominal pain.

## 2023-02-06 NOTE — Anesthesia Preprocedure Evaluation (Signed)
Anesthesia Evaluation  Patient identified by MRN, date of birth, ID band Patient awake    Reviewed: Allergy & Precautions, NPO status , Patient's Chart, lab work & pertinent test results  Airway Mallampati: II  TM Distance: <3 FB Neck ROM: Full  Mouth opening: Limited Mouth Opening  Dental  (+) Upper Dentures   Pulmonary neg pulmonary ROS, COPD,  COPD inhaler, Current Smoker   Pulmonary exam normal  + decreased breath sounds      Cardiovascular Exercise Tolerance: Good negative cardio ROS Normal cardiovascular exam Rhythm:Regular Rate:Normal     Neuro/Psych   Anxiety     negative neurological ROS  negative psych ROS   GI/Hepatic negative GI ROS, Neg liver ROS,,,  Endo/Other  negative endocrine ROS    Renal/GU negative Renal ROS  negative genitourinary   Musculoskeletal   Abdominal Normal abdominal exam  (+)   Peds negative pediatric ROS (+)  Hematology negative hematology ROS (+)   Anesthesia Other Findings Past Medical History: No date: Anxiety No date: Arthritis No date: COPD (chronic obstructive pulmonary disease) (HCC) No date: Degenerative disc disease, cervical No date: Depression No date: Fibromyalgia No date: GERD (gastroesophageal reflux disease) No date: HLD (hyperlipidemia) No date: IBS (irritable bowel syndrome) No date: Psoriasis No date: PTSD (post-traumatic stress disorder)  Past Surgical History: No date: ABDOMINAL HYSTERECTOMY No date: CARPAL TUNNEL RELEASE No date: KNEE SURGERY No date: NECK SURGERY  BMI    Body Mass Index: 21.90 kg/m      Reproductive/Obstetrics negative OB ROS                              Anesthesia Physical Anesthesia Plan  ASA: 3  Anesthesia Plan: General   Post-op Pain Management:    Induction: Intravenous  PONV Risk Score and Plan: Propofol infusion and TIVA  Airway Management Planned: Natural Airway  Additional  Equipment:   Intra-op Plan:   Post-operative Plan:   Informed Consent: I have reviewed the patients History and Physical, chart, labs and discussed the procedure including the risks, benefits and alternatives for the proposed anesthesia with the patient or authorized representative who has indicated his/her understanding and acceptance.     Dental Advisory Given  Plan Discussed with: CRNA and Surgeon  Anesthesia Plan Comments:          Anesthesia Quick Evaluation

## 2023-02-07 ENCOUNTER — Other Ambulatory Visit: Payer: Self-pay

## 2023-02-07 ENCOUNTER — Emergency Department
Admission: EM | Admit: 2023-02-07 | Discharge: 2023-02-07 | Disposition: A | Discharge status: Home / Self Care | Payer: 59 | Attending: Emergency Medicine | Admitting: Emergency Medicine

## 2023-02-07 DIAGNOSIS — R109 Unspecified abdominal pain: Secondary | ICD-10-CM | POA: Diagnosis present

## 2023-02-07 DIAGNOSIS — R1084 Generalized abdominal pain: Secondary | ICD-10-CM | POA: Diagnosis not present

## 2023-02-07 DIAGNOSIS — J449 Chronic obstructive pulmonary disease, unspecified: Secondary | ICD-10-CM | POA: Insufficient documentation

## 2023-02-07 LAB — CBC
HCT: 40 % (ref 36.0–46.0)
Hemoglobin: 13.4 g/dL (ref 12.0–15.0)
MCH: 32 pg (ref 26.0–34.0)
MCHC: 33.5 g/dL (ref 30.0–36.0)
MCV: 95.5 fL (ref 80.0–100.0)
Platelets: 162 10*3/uL (ref 150–400)
RBC: 4.19 MIL/uL (ref 3.87–5.11)
RDW: 13.2 % (ref 11.5–15.5)
WBC: 6 10*3/uL (ref 4.0–10.5)
nRBC: 0 % (ref 0.0–0.2)

## 2023-02-07 LAB — COMPREHENSIVE METABOLIC PANEL
ALT: 15 U/L (ref 0–44)
AST: 20 U/L (ref 15–41)
Albumin: 3.6 g/dL (ref 3.5–5.0)
Alkaline Phosphatase: 85 U/L (ref 38–126)
Anion gap: 8 (ref 5–15)
BUN: 8 mg/dL (ref 8–23)
CO2: 25 mmol/L (ref 22–32)
Calcium: 8.8 mg/dL — ABNORMAL LOW (ref 8.9–10.3)
Chloride: 106 mmol/L (ref 98–111)
Creatinine, Ser: 0.81 mg/dL (ref 0.44–1.00)
GFR, Estimated: >60 mL/min (ref >60)
Glucose, Bld: 96 mg/dL (ref 70–99)
Potassium: 3.1 mmol/L — ABNORMAL LOW (ref 3.5–5.1)
Sodium: 139 mmol/L (ref 135–145)
Total Bilirubin: 0.4 mg/dL (ref 0.3–1.2)
Total Protein: 6.7 g/dL (ref 6.5–8.1)

## 2023-02-07 LAB — LIPASE, BLOOD: Lipase: 38 U/L (ref 11–51)

## 2023-02-07 MED ORDER — ONDANSETRON HCL 4 MG PO TABS: 4.0000 mg | ORAL_TABLET | Freq: Every day | ORAL | 1 refills | Status: DC | PRN

## 2023-02-07 MED ORDER — ACETAMINOPHEN 500 MG PO TABS
1000.0000 mg | ORAL_TABLET | Freq: Once | ORAL | Status: AC
  Administered 2023-02-07: 1000 mg via ORAL
  Filled 2023-02-07: qty 2

## 2023-02-07 MED ORDER — KETOROLAC TROMETHAMINE 15 MG/ML IJ SOLN
15.0000 mg | Freq: Once | INTRAMUSCULAR | Status: AC
  Administered 2023-02-07: 15 mg via INTRAVENOUS
  Filled 2023-02-07: qty 1

## 2023-02-07 MED ORDER — ONDANSETRON HCL 4 MG/2ML IJ SOLN
4.0000 mg | Freq: Once | INTRAMUSCULAR | Status: AC
  Administered 2023-02-07: 4 mg via INTRAVENOUS
  Filled 2023-02-07: qty 2

## 2023-02-07 MED ORDER — SODIUM CHLORIDE 0.9 % IV BOLUS
1000.0000 mL | Freq: Once | INTRAVENOUS | Status: AC
  Administered 2023-02-07: 1000 mL via INTRAVENOUS

## 2023-02-07 NOTE — Discharge Instructions (Signed)
Take acetaminophen 650 mg and ibuprofen 400 mg every 6 hours for pain.  Take with food. Take zofran for nausea as needed.   Call Dr. Tobi Bastos gastroenterology for follow-up appointment this week.  As we discussed if you experience any new, worsening, or unexpected symptoms call your doctor right away or come back to the emergency department for reevaluation.

## 2023-02-07 NOTE — ED Provider Notes (Signed)
[facility] Provider Note    Event Date/Time   First MD Initiated Contact with Patient 02/07/23 1502     (approximate)   History   Fever (abdomina), Abdominal Pain, and Nausea   HPI  Emily Johnston is a 62 y.o. female   Past medical history of COPD, PTSD, fibromyalgia and IBS who presents to the emergency department with abdominal pain after colonoscopy 2 days ago.  Pain has been constant since the procedure and was seen yesterday in the emergency department got a CT scan of the abdomen pelvis which was negative for acute pathologies and had blood testing done, consultation with Dr. Tobi Bastos of gastroenterology at that time was ultimately discharged.  Patient continues to have nausea and abdominal pain.  Her rectal bleeding has now stopped.  She states that she had a low-grade fever of 99 degrees.  She has had no urinary symptoms.  Has had no bowel movement for 2 days, but has been passing gas.  External Medical Documents Reviewed: Emergency department visit dated yesterday for abdominal pain got a CT scan      Physical Exam   Triage Vital Signs: ED Triage Vitals  Enc Vitals Group     BP 02/07/23 1428 (!) 140/100     Pulse Rate 02/07/23 1428 75     Resp 02/07/23 1428 16     Temp 02/07/23 1428 97.6 F (36.4 C)     Temp Source 02/07/23 1428 Oral     SpO2 02/07/23 1428 99 %     Weight 02/07/23 1429 126 lb 15.8 oz (57.6 kg)     Height 02/07/23 1429 5\' 4"  (1.626 m)     Head Circumference --      Peak Flow --      Pain Score 02/07/23 1428 9     Pain Loc --      Pain Edu? --      Excl. in GC? --     Most recent vital signs: Vitals:   02/07/23 1500 02/07/23 1515  BP: 139/88   Pulse: 72 70  Resp:    Temp:    SpO2: 96% 95%    General: Awake, no distress.  CV:  Good peripheral perfusion.  Resp:  Normal effort.  Abd:  No distention.  Other:  Awake alert soft nontender abdomen warm well-perfused skin normal hemodynamics no  fever.   ED Results / Procedures / Treatments   Labs (all labs ordered are listed, but only abnormal results are displayed) Labs Reviewed  COMPREHENSIVE METABOLIC PANEL - Abnormal; Notable for the following components:      Result Value   Potassium 3.1 (*)    Calcium 8.8 (*)    All other components within normal limits  LIPASE, BLOOD  CBC     I ordered and reviewed the above labs they are notable for white blood cell count, H&H, electrolytes unchanged from yesterday.  RADIOLOGY I independently reviewed and interpreted CT scan of the abdomen pelvis from yesterday and see no obvious obstructive or inflammatory changes.   PROCEDURES:  Critical Care performed: No  Procedures   MEDICATIONS ORDERED IN ED: Medications  acetaminophen (TYLENOL) tablet 1,000 mg (has no administration in time range)  ketorolac (TORADOL) 15 MG/ML injection 15 mg (has no administration in time range)  sodium chloride 0.9 % bolus 1,000 mL (has no administration in time range)  ondansetron (ZOFRAN) injection 4 mg (has no administration in time range)     IMPRESSION / MDM /  ASSESSMENT AND PLAN / ED COURSE  I reviewed the triage vital signs and the nursing notes.                                Patient's presentation is most consistent with acute presentation with potential threat to life or bodily function.   Differential diagnosis includes, but is not limited to, obstruction, intra-abdominal infection, postoperative bleeding, postoperative pain, urinary tract infection   The patient is on the cardiac monitor to evaluate for evidence of arrhythmia and/or significant heart rate changes.  MDM: This is a patient with post colonoscopy pain and nausea unchanged since the procedure 2 days ago.  She had an unremarkable CT scan yesterday her rectal bleeding has stopped.  Her labs are unchanged from yesterday with no increase in white blood cell count, LFTs, and no changes in H&H with a benign soft  nontender abdominal exam.  GI was consulted yesterday and these are expected symptoms from post colonoscopy with polyp removal.  Given unchanged labs, unchanged symptoms, and benign abdominal exam I doubt there is a emergent intra-abdominal emergency at this time.  Will focus on symptoms with pain control, IV fluids, Zofran, and she will follow-up with GI.       FINAL CLINICAL IMPRESSION(S) / ED DIAGNOSES   Final diagnoses:  Generalized abdominal pain     Rx / DC Orders   ED Discharge Orders          Ordered    ondansetron (ZOFRAN) 4 MG tablet  Daily PRN        02/07/23 1546             Note:  This document was prepared using Dragon voice recognition software and may include unintentional dictation errors.    Pilar Jarvis, MD 02/07/23 [phone]

## 2023-02-07 NOTE — ED Notes (Addendum)
EDP into room, at [facility]. Pt alert, NAD, calm, interactive, resps e/u, speaking clearly. Labs resulted and reviewed. Urine sample pending.

## 2023-02-07 NOTE — ED Triage Notes (Signed)
Pt to ED POV, seen here yesterday also. Had colonoscopy yesterday morning. Pt complained yesterday of rectal bleeding and was discharged. Pt now states that since this morning she has increased abdominal pain, nausea, weakness, and fever which was 99.1 at home. Pt is passing gas.

## 2023-02-10 ENCOUNTER — Telehealth: Payer: Self-pay | Admitting: Gastroenterology

## 2023-02-10 ENCOUNTER — Encounter: Payer: Self-pay | Admitting: Gastroenterology

## 2023-02-10 ENCOUNTER — Telehealth: Payer: Self-pay

## 2023-02-10 DIAGNOSIS — K921 Melena: Secondary | ICD-10-CM

## 2023-02-10 DIAGNOSIS — R1013 Epigastric pain: Secondary | ICD-10-CM

## 2023-02-10 MED ORDER — OMEPRAZOLE 40 MG PO CPDR: 40.0000 mg | DELAYED_RELEASE_CAPSULE | Freq: Two times a day (BID) | ORAL | 0 refills | Status: DC

## 2023-02-10 MED ORDER — DICYCLOMINE HCL 10 MG PO CAPS: 10.0000 mg | ORAL_CAPSULE | Freq: Four times a day (QID) | ORAL | 0 refills | Status: DC

## 2023-02-10 MED ORDER — SUCRALFATE 1 G PO TABS: 1.0000 g | ORAL_TABLET | Freq: Four times a day (QID) | ORAL | 0 refills | Status: DC

## 2023-02-10 NOTE — Telephone Encounter (Signed)
1. Commence on Bentyl 10 MG QID 2. Miralax 1 cap a day  3. Omeprazole 40 mg BID 4. Carafate QID  If no better in 48 hours will need to be seen in the office

## 2023-02-10 NOTE — Telephone Encounter (Addendum)
Per pt spouse pt is very sick. Pt has gone to ER twice with nothing been done. Per pt he spoke with Dr. Servando Snare over the weekend and was told she my need to ne admitted if pt not any better pt is now worse.  [phone]   Pt states he has call several times with no return call.

## 2023-02-10 NOTE — Telephone Encounter (Addendum)
Called patient back and she stated that she had her colonoscopy done on 02/06/2023 with 13+ polyps removed. Patient stated that ever since, she has had generalized abdominal pain with tenderness, nausea, dry heaving, weak, low grade fever and no appetite. Patient stated that she has not had a bowel movement since her procedure. Patient was instructed by Dr. Servando Snare to go to the emergency room and nothing was found from her labs and CT Abdominal. Patient would like to know what she can do for her symptoms.

## 2023-02-10 NOTE — Telephone Encounter (Signed)
Pt lvmm having stomach problems since procedure please return call

## 2023-02-10 NOTE — Telephone Encounter (Signed)
Called patient back and let her know what were Dr. Johnney Killian recommendations. Please read below.

## 2023-02-10 NOTE — Telephone Encounter (Signed)
Please read other phone note from today's date.

## 2023-02-10 NOTE — Addendum Note (Signed)
Addended by: Adela Ports on: 02/10/2023 04:15 PM   Modules accepted: Orders

## 2023-02-11 ENCOUNTER — Encounter: Payer: Self-pay | Admitting: Nurse Practitioner

## 2023-02-11 ENCOUNTER — Other Ambulatory Visit: Payer: Self-pay | Admitting: Nurse Practitioner

## 2023-02-11 ENCOUNTER — Telehealth: Payer: Self-pay

## 2023-02-11 MED ORDER — CHOLECALCIFEROL 1.25 MG (50000 UT) PO TABS: 1.0000 | ORAL_TABLET | ORAL | 4 refills | Status: DC

## 2023-02-11 NOTE — Transitions of Care (Post Inpatient/ED Visit) (Unsigned)
   02/11/2023  Name: Emily Johnston MRN: [id number] DOB: [birth year]  Today's TOC FU Call Status: Today's TOC FU Call Status:: Unsuccessul Call (1st Attempt) Unsuccessful Call (1st Attempt) Date: 02/11/23  Attempted to reach the patient regarding the most recent Inpatient/ED visit.  Follow Up Plan: Additional outreach attempts will be made to reach the patient to complete the Transitions of Care (Post Inpatient/ED visit) call.   Signature [place], [facility]

## 2023-02-11 NOTE — Telephone Encounter (Signed)
-----   Message from Pablo Ledger, [place] sent at 02/10/2023 10:48 AM EDT ----- Patient needs TOC call completed please.

## 2023-02-12 ENCOUNTER — Encounter: Payer: Self-pay | Admitting: Nurse Practitioner

## 2023-02-12 NOTE — Telephone Encounter (Signed)
error 

## 2023-02-12 NOTE — Transitions of Care (Post Inpatient/ED Visit) (Signed)
   02/12/2023  Name: Emily Johnston MRN: [id number] DOB: Aug 01, 1961  Today's TOC FU Call Status: Today's TOC FU Call Status:: Unsuccessful Call (2nd Attempt) Unsuccessful Call (1st Attempt) Date: 02/11/23 Unsuccessful Call (2nd Attempt) Date: 02/12/23  Attempted to reach the patient regarding the most recent Inpatient/ED visit.  Follow Up Plan: Additional outreach attempts will be made to reach the patient to complete the Transitions of Care (Post Inpatient/ED visit) call.   Signature [place], [facility]

## 2023-02-13 LAB — SURGICAL PATHOLOGY

## 2023-02-16 ENCOUNTER — Ambulatory Visit
Admission: RE | Admit: 2023-02-16 | Discharge: 2023-02-16 | Disposition: A | Discharge status: Home / Self Care | Payer: 59 | Source: Ambulatory Visit | Attending: Gastroenterology | Admitting: Gastroenterology

## 2023-02-16 ENCOUNTER — Other Ambulatory Visit
Admission: RE | Admit: 2023-02-16 | Discharge: 2023-02-16 | Disposition: A | Discharge status: Home / Self Care | Payer: 59 | Source: Ambulatory Visit | Attending: Gastroenterology | Admitting: Gastroenterology

## 2023-02-16 ENCOUNTER — Encounter: Payer: Self-pay | Admitting: Gastroenterology

## 2023-02-16 ENCOUNTER — Other Ambulatory Visit: Payer: Self-pay

## 2023-02-16 ENCOUNTER — Telehealth: Payer: Self-pay

## 2023-02-16 DIAGNOSIS — R1013 Epigastric pain: Secondary | ICD-10-CM | POA: Insufficient documentation

## 2023-02-16 DIAGNOSIS — K921 Melena: Secondary | ICD-10-CM | POA: Diagnosis present

## 2023-02-16 DIAGNOSIS — D126 Benign neoplasm of colon, unspecified: Secondary | ICD-10-CM

## 2023-02-16 LAB — COMPREHENSIVE METABOLIC PANEL
ALT: 14 U/L (ref 0–44)
AST: 21 U/L (ref 15–41)
Albumin: 3.8 g/dL (ref 3.5–5.0)
Alkaline Phosphatase: 109 U/L (ref 38–126)
Anion gap: 9 (ref 5–15)
BUN: 10 mg/dL (ref 8–23)
CO2: 24 mmol/L (ref 22–32)
Calcium: 9.8 mg/dL (ref 8.9–10.3)
Chloride: 106 mmol/L (ref 98–111)
Creatinine, Ser: 0.8 mg/dL (ref 0.44–1.00)
GFR, Estimated: >60 mL/min (ref >60)
Glucose, Bld: 99 mg/dL (ref 70–99)
Potassium: 3.7 mmol/L (ref 3.5–5.1)
Sodium: 139 mmol/L (ref 135–145)
Total Bilirubin: 0.5 mg/dL (ref 0.3–1.2)
Total Protein: 6.9 g/dL (ref 6.5–8.1)

## 2023-02-16 LAB — CBC WITH DIFFERENTIAL/PLATELET
Abs Immature Granulocytes: 0.01 10*3/uL (ref 0.00–0.07)
Basophils Absolute: 0.1 10*3/uL (ref 0.0–0.1)
Basophils Relative: 1 %
Eosinophils Absolute: 0.1 10*3/uL (ref 0.0–0.5)
Eosinophils Relative: 2 %
HCT: 41 % (ref 36.0–46.0)
Hemoglobin: 13.8 g/dL (ref 12.0–15.0)
Immature Granulocytes: 0 %
Lymphocytes Relative: 28 %
Lymphs Abs: 2.3 10*3/uL (ref 0.7–4.0)
MCH: 31.6 pg (ref 26.0–34.0)
MCHC: 33.7 g/dL (ref 30.0–36.0)
MCV: 93.8 fL (ref 80.0–100.0)
Monocytes Absolute: 0.5 10*3/uL (ref 0.1–1.0)
Monocytes Relative: 6 %
Neutro Abs: 5.4 10*3/uL (ref 1.7–7.7)
Neutrophils Relative %: 63 %
Platelets: 188 10*3/uL (ref 150–400)
RBC: 4.37 MIL/uL (ref 3.87–5.11)
RDW: 13.2 % (ref 11.5–15.5)
WBC: 8.5 10*3/uL (ref 4.0–10.5)
nRBC: 0 % (ref 0.0–0.2)

## 2023-02-16 NOTE — Progress Notes (Signed)
Maritza inform 12 tubular adenomas - repeat colonoscopty in 1 year  Also recommend referral to genetic testing to find out reason for multiple polyps - may have a syndrome and affect screening in his family members

## 2023-02-16 NOTE — Addendum Note (Signed)
Addended by: Adela Ports on: 02/16/2023 12:18 PM   Modules accepted: Orders

## 2023-02-16 NOTE — Telephone Encounter (Unsigned)
Called patient and she agreed to send the referral to genetic counselor.

## 2023-02-16 NOTE — Telephone Encounter (Signed)
-----   Message from Wyline Mood, MD sent at 02/16/2023  8:56 AM EDT ----- Kandis Cocking inform 12 tubular adenomas - repeat colonoscopty in 1 year  Also recommend referral to genetic testing to find out reason for multiple polyps - may have a syndrome and affect screening in his family members

## 2023-02-16 NOTE — Telephone Encounter (Signed)
Called patient back with Dr. Johnney Killian recommendations. Patient declined going to the Emergency Room since she stated that she had been there last week and nothing was found. However, patient stated that she knows her body and she knows that their is some kind of infection. Patient stated that she has had a low grade fever (99.3 F), nausea, weak, body aches, chills and blood when having a bowel movement (bright red blood). Patient stated that she will go and have the X-Ray of the abdomen and labs that Dr. Tobi Bastos is recommending and she wants to be seen tomorrow. Labs and appointment was scheduled.

## 2023-02-17 ENCOUNTER — Ambulatory Visit (INDEPENDENT_AMBULATORY_CARE_PROVIDER_SITE_OTHER): Payer: 59 | Admitting: Gastroenterology

## 2023-02-17 ENCOUNTER — Encounter: Payer: Self-pay | Admitting: Gastroenterology

## 2023-02-17 VITALS — BP 146/78 | HR 108 | Temp 97.9°F | Ht 64.0 in | Wt 127.0 lb

## 2023-02-17 DIAGNOSIS — R197 Diarrhea, unspecified: Secondary | ICD-10-CM

## 2023-02-17 NOTE — Patient Instructions (Addendum)
We are sorry, but we no longer have samples for protein shakes. Dr. Tobi Bastos would like for you to purchase Protein shakes at your local pharmacy or supermarket.   Please take Probiotic once a day for 10 days.  Please take IBgard as needed for abdominal discomfort. Take 2 capsules up to three times a day as needed. This could be purchased at your local pharmacy with out a prescription.

## 2023-02-17 NOTE — Progress Notes (Signed)
My staff will take care of it thanks   [facility]

## 2023-02-17 NOTE — Progress Notes (Signed)
Wyline Mood MD, MRCP(U.K) [address]  [address]  [place], [place] [address]  Main: [phone]  Fax: [phone]   Gastroenterology Consultation  Referring Provider:     Marjie Skiff, NP Primary Care Physician:  Marjie Skiff, NP Primary Gastroenterologist:  Dr. Wyline Mood  Reason for Consultation:     Pain after colonoscopy        HPI:   Emily Johnston is a 62 y.o. y/o female referred for consultation & management  by Marjie Skiff, NP.     She is here to see me abdominal pain.  Performed a colonoscopy after she was seen by Celso Amy, PA at our office on 01/15/2023 for melena.  Abdominal pain.  Hemoglobin has always been normal at 14.2 g at least she has been on a PPI.   I performed an upper endoscopy on 02/06/2023 and found a hiatal hernia possibly 5 mm polyp in the duodenal bulb was noted. On the same day I performed a colonoscopy and resected multiple polyps in the cecum ascending colon transverse colon.  In total 12 polyps ranging from 3 mm to 18 mm were resected clips were placed.   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   She went to the emergency room on 02/02/2023 with abdominal pain after the procedure had a CT scan of the abdomen showed no perforation no acute findings.  Hemoglobin and CMP within normal limits.  She called our office yesterday complaining of abdominal pain and rectal bleeding.  Yet again x-ray of the abdomen showed nonobstructive gas pattern stool was scattered throughout the colon.  Hemoglobin performed yesterday was 13.4 at baseline CMP normal except borderline low potassium 3.1.  The patient states that she has been having some diarrhea after the procedure, no appetite having the chills and having a fever but it has been less than 99 when she checked her temperature.  Passing gas.  Smoking half a pack of cigarettes per day.  Having some  blood-tinged stool Past Medical History:  Diagnosis Date   Anxiety    Arthritis    COPD (chronic obstructive pulmonary disease) (HCC)    Degenerative disc disease, cervical    Depression    Fibromyalgia    GERD (gastroesophageal reflux disease)    HLD (hyperlipidemia)    IBS (irritable bowel syndrome)    Psoriasis    PTSD (post-traumatic stress disorder)     Past Surgical History:  Procedure Laterality Date   ABDOMINAL HYSTERECTOMY     CARPAL TUNNEL RELEASE     COLONOSCOPY WITH PROPOFOL N/A 02/06/2023   Procedure: COLONOSCOPY WITH PROPOFOL;  Surgeon: Wyline Mood, MD;  Location[facility] ENDOSCOPY;  Service: Endoscopy;  Laterality: N/A;   ESOPHAGOGASTRODUODENOSCOPY (EGD) WITH PROPOFOL N/A 02/06/2023   Procedure: ESOPHAGOGASTRODUODENOSCOPY (EGD) WITH PROPOFOL;  Surgeon: Wyline Mood, MD;  Location: [facility] ENDOSCOPY;  Service: Endoscopy;  Laterality: N/A;   KNEE SURGERY     NECK SURGERY      Prior to Admission medications   Medication Sig Start Date End Date Taking? Authorizing Provider  acitretin (SORIATANE) 25 MG capsule Take 1 capsule by mouth daily. 04/30/20   [provider]  albuterol (VENTOLIN HFA) 108 (90 Base) MCG/ACT inhaler Inhale 2 puffs into the lungs every 6 (six) hours as needed for wheezing or shortness of breath. 09/04/20   Johnson, Megan P, DO  Cholecalciferol 1.25 MG (50000 UT) TABS Take 1  tablet by mouth once a week. 02/11/23   Cannady, Corrie Dandy T, NP  clobetasol ointment (TEMOVATE) 0.05 % Apply 1 application. topically 2 (two) times daily. 09/24/21   [provider]  clonazePAM (KLONOPIN) 0.5 MG tablet Take 1 tablet (0.5 mg total) by mouth 2 (two) times daily as needed for anxiety. 01/21/23   Cannady, Corrie Dandy T, NP  dicyclomine (BENTYL) 10 MG capsule Take 1 capsule (10 mg total) by mouth in the morning, at noon, in the evening, and at bedtime. 02/10/23   Wyline Mood, MD  DULoxetine (CYMBALTA) 60 MG capsule Take 2 capsules (120 mg total) by mouth daily. 05/12/22    Cannady, Corrie Dandy T, NP  fluticasone (FLONASE) 50 MCG/ACT nasal spray SPRAY 2 SPRAYS INTO EACH NOSTRIL EVERY DAY 12/16/22   Cannady, Corrie Dandy T, NP  Fluticasone-Umeclidin-Vilant (TRELEGY ELLIPTA) 200-62.5-25 MCG/ACT AEPB Inhale 1 puff into the lungs daily. 01/09/23   Salena Saner, MD  folic acid (FOLVITE) 1 MG tablet Take 1 mg by mouth daily.    [provider]  hydrocortisone 1 % ointment Apply 1 Application topically 2 (two) times daily. 11/23/22   Minna Antis, MD  lactobacillus acidophilus & bulgar (LACTINEX) chewable tablet Chew 1 tablet by mouth 3 (three) times daily with meals. 12/25/22   Cannady, Corrie Dandy T, NP  leflunomide (ARAVA) 20 MG tablet Take 20 mg by mouth daily. 02/03/23 05/04/23  [provider]  meclizine (ANTIVERT) 12.5 MG tablet Take 1 tablet (12.5 mg total) by mouth 3 (three) times daily as needed for dizziness. 09/04/20   Johnson, Megan P, DO  montelukast (SINGULAIR) 10 MG tablet Take 1 tablet (10 mg total) by mouth at bedtime. 11/24/22   Aura Dials T, NP  Multiple Vitamin (MULTIVITAMIN) tablet Take 1 tablet by mouth daily.    [provider]  omeprazole (PRILOSEC) 40 MG capsule Take 1 capsule (40 mg total) by mouth in the morning and at bedtime. 02/10/23   Wyline Mood, MD  ondansetron [facility]) 4 MG tablet Take 1 tablet (4 mg total) by mouth daily as needed for nausea or vomiting. 02/07/23 02/07/24  Pilar Jarvis, MD  oxyCODONE-acetaminophen (PERCOCET) 5-325 MG tablet Take 1 tablet by mouth every 4 (four) hours as needed for severe pain. Patient not taking: Reported on 01/15/2023 01/06/23 01/06/24  Chesley Noon, MD  pantoprazole (PROTONIX) 40 MG tablet Take 1 tablet (40 mg total) by mouth daily. 01/06/23 04/06/23  Chesley Noon, MD  rosuvastatin (CRESTOR) 10 MG tablet Take 1 tablet (10 mg total) by mouth daily. 11/15/22   Cannady, Corrie Dandy T, NP  sucralfate (CARAFATE) 1 g tablet Take 1 tablet (1 g total) by mouth 4 (four) times daily. 02/10/23 02/10/24  Wyline Mood, MD  tacrolimus (PROTOPIC) 0.1 % ointment 2 (two) times daily. 01/10/23   [provider]    Family History  Problem Relation Age of Onset   Breast cancer Mother 95     Social History   Tobacco Use   Smoking status: Every Day    Packs/day: 1.00    Years: 41.00    Additional pack years: 0.00    Total pack years: 41.00    Types: Cigarettes   Smokeless tobacco: Never   Tobacco comments:    1 PPD - 12/23/2022 khj  Vaping Use   Vaping Use: Never used  Substance Use Topics   Alcohol use: No   Drug use: No    Allergies as of 02/17/2023   (No Known Allergies)    Review of Systems:  All systems reviewed and negative except where noted in HPI.   Physical Exam:  BP (!) 146/78   Pulse (!) 108   Temp 97.9 F (36.6 C) (Oral)   Ht 5\' 4"  (1.626 m)   Wt 127 lb (57.6 kg)   BMI 21.80 kg/m  No LMP recorded. Patient has had a hysterectomy. Psych:  Alert and cooperative. Normal mood and affect. General:   Alert,  Well-developed, well-nourished, pleasant and cooperative in NAD Head:  Normocephalic and atraumatic. Eyes:  Sclera clear, no icterus.   Conjunctiva pink. Ears:  Normal auditory acuity. Neck:  Supple; no masses or thyromegaly. Lungs:  Respirations even and unlabored.  Clear throughout to auscultation.   No wheezes, crackles, or rhonchi. No acute distress. Heart:  Regular rate and rhythm; no murmurs, clicks, rubs, or gallops. Abdomen:  Normal bowel sounds.  No bruits.  Soft, non-tender and non-distended without masses, hepatosplenomegaly or hernias noted.  No guarding or rebound tenderness.    Neurologic:  Alert and oriented x3;  grossly normal neurologically. Psych:  Alert and cooperative. Normal mood and affect.  Imaging Studies: DG Abd 2 Views  Result Date: 02/16/2023 CLINICAL DATA:  Epigastric and lower abdominal pain EXAM: ABDOMEN - 2 VIEW COMPARISON:  CT abdomen pelvis 02/06/2023 FINDINGS: Lung bases are clear. Gas is demonstrated within nondilated  loops of large and small bowel in a nonobstructed pattern. Stool scattered throughout the colon. Pelvic phleboliths. Lumbar spine degenerative changes. IMPRESSION: Nonobstructed bowel-gas pattern. Electronically Signed   By: Annia Belt M.D.   On: 02/16/2023 21:35   CT ABDOMEN PELVIS W CONTRAST  Result Date: 02/06/2023 CLINICAL DATA:  Postoperative abdominal pain and bleeding following endoscopy and colonoscopy EXAM: CT ABDOMEN AND PELVIS WITH CONTRAST TECHNIQUE: Multidetector CT imaging of the abdomen and pelvis was performed using the standard protocol following bolus administration of intravenous contrast. RADIATION DOSE REDUCTION: This exam was performed according to the departmental dose-optimization program which includes automated exposure control, adjustment of the mA and/or kV according to patient size and/or use of iterative reconstruction technique. CONTRAST:  OMNIPAQUE IOHEXOL 300 MG/ML  SOLN COMPARISON:  01/06/2023 FINDINGS: Lower chest: No acute abnormality. Hepatobiliary: No solid liver abnormality is seen. No gallstones, gallbladder wall thickening, or biliary dilatation. Pancreas: Unremarkable. No pancreatic ductal dilatation or surrounding inflammatory changes. Spleen: Normal in size without significant abnormality. Adrenals/Urinary Tract: Adrenal glands are unremarkable. Kidneys are normal, without renal calculi, solid lesion, or hydronephrosis. Bladder is unremarkable. Stomach/Bowel: Stomach is within normal limits. Appendix not clearly visualized. No evidence of bowel wall thickening, distention, or inflammatory changes. Multiple biopsy clips within the colon (series 2, image 46). Vascular/Lymphatic: Aortic atherosclerosis. No enlarged abdominal or pelvic lymph nodes. Reproductive: Status post hysterectomy. Other: No abdominal wall hernia or abnormality. No ascites. Musculoskeletal: No acute or significant osseous findings. IMPRESSION: 1. No acute CT findings of the abdomen or pelvis  to explain abdominal pain or bleeding. 2. Multiple biopsy clips within the colon, in keeping with reported polyp removal. 3. Status post hysterectomy. Aortic Atherosclerosis (ICD10-I70.0). Electronically Signed   By: Jearld Lesch M.D.   On: 02/06/2023 15:15    Assessment and Plan:   KSENIA COBAS is a 62 y.o. y/o female is here today to see me for follow-up after her recent colonoscopy and endoscopy which she had for melena and was found to have numerous tubular adenomas in the colon and no gross abnormalities of the stomach.  Following the colonoscopy she had some abdominal pain went to the emergency  room had a CT scan which showed no evidence of perforation.  She did have internal hemorrhoids on her colonoscopy which I believe is the cause of her blood-tinged stool as her hemoglobin is stable.  Her symptoms are nonspecific I will rule out an infection with stool studies  Plan 1.  Stool studies for diarrhea 2.  Trial of IBgard and probiotics 3. genetic counseling for over 10 tubular adenomas 4.  Repeat colonoscopy in 1 year. 5.  Sitz bath for inflamed hemorrhoids  Follow up in as needed  Dr Wyline Mood MD,MRCP(U.K)

## 2023-02-20 LAB — GI PROFILE, STOOL, PCR
Enterotoxigenic E coli: NOT DETECTED
Plesiomonas shigelloides: NOT DETECTED
Salmonella: NOT DETECTED
Vibrio cholerae: NOT DETECTED

## 2023-02-20 LAB — C DIFFICILE, CYTOTOXIN B

## 2023-02-21 LAB — GI PROFILE, STOOL, PCR
Adenovirus F 40/41: NOT DETECTED
Campylobacter: NOT DETECTED
Entamoeba histolytica: NOT DETECTED
Giardia lamblia: NOT DETECTED
Yersinia enterocolitica: NOT DETECTED

## 2023-02-21 LAB — C DIFFICILE, CYTOTOXIN B

## 2023-02-22 LAB — GI PROFILE, STOOL, PCR
Astrovirus: NOT DETECTED
C difficile toxin A/B: NOT DETECTED
Cryptosporidium: NOT DETECTED
Cyclospora cayetanensis: NOT DETECTED
Enteroaggregative E coli: NOT DETECTED
Enteropathogenic E coli: NOT DETECTED
Norovirus GI/GII: NOT DETECTED
Rotavirus A: NOT DETECTED
Sapovirus: NOT DETECTED
Shiga-toxin-producing E coli: NOT DETECTED
Shigella/Enteroinvasive E coli: NOT DETECTED
Vibrio: NOT DETECTED

## 2023-02-22 LAB — C DIFFICILE TOXINS A+B W/RFLX: C difficile Toxins A+B, EIA: NEGATIVE

## 2023-02-22 NOTE — Patient Instructions (Signed)
Fever, Adult     A fever is a high body temperature that is 100.97F (38C) or higher. Brief mild or moderate fevers often have no lasting effects. They often do not need treatment. Moderate or high fevers can feel uncomfortable. Sometimes, they can be a sign of a serious problem. A fever that keeps coming back or that lasts a long time may cause you to lose water in your body (get dehydrated). You can use a thermometer to check for a fever. Temperature can change with: Age. Time of day. Where the temperature is taken, such as: In the mouth. In the opening of the butt (anus). In the ear. Under the arm. On the forehead. Follow these instructions at home: Medicines Take over-the-counter and prescription medicines only as told by your doctor. Follow instructions on how much medicine to take and how often. If you were prescribed antibiotics, take them as told by your doctor. Do not stop taking them even if you start to feel better. General instructions Watch for any changes in your symptoms. Tell your doctor about them. Rest as needed. Drink enough fluid to keep your pee (urine) pale yellow. Bathe or sponge bathe with room-temperature water as needed. This helps to lower your body temperature. Do not use cold water. Do not use too many blankets or wear clothes that are too heavy. You should stay home from work and public places for at least 24 hours after your fever is gone. Your fever should be gone without the use of medicines. Contact a doctor if: You vomit or have watery poop (diarrhea) that does not get better. You cannot eat or drink without vomiting. It hurts when you pee. Your symptoms do not get better with treatment or you have new symptoms. You have a rash on your skin. You have signs of not having enough water in your body, such as: Dark pee, very little pee, or no pee. Cracked lips or dry mouth. Sunken eyes. Sleepiness. Weakness. Get help right away if: You are short of  breath or have trouble breathing. You are dizzy or pass out (faint). You feel mixed up or confused. You have very bad pain in your belly (abdomen). These symptoms may be an emergency. Get help right away. Call 911. Do not wait to see if the symptoms will go away. Do not drive yourself to the hospital. This information is not intended to replace advice given to you by your health care provider. Make sure you discuss any questions you have with your health care provider. Document Revised: 06/03/2022 Document Reviewed: 06/03/2022 Elsevier Patient Education  2024 [employer].

## 2023-02-24 ENCOUNTER — Ambulatory Visit (INDEPENDENT_AMBULATORY_CARE_PROVIDER_SITE_OTHER): Payer: 59 | Admitting: Nurse Practitioner

## 2023-02-24 ENCOUNTER — Ambulatory Visit
Admission: RE | Admit: 2023-02-24 | Discharge: 2023-02-24 | Disposition: A | Discharge status: Home / Self Care | Payer: 59 | Source: Ambulatory Visit | Attending: Nurse Practitioner | Admitting: Nurse Practitioner

## 2023-02-24 ENCOUNTER — Encounter: Payer: Self-pay | Admitting: Nurse Practitioner

## 2023-02-24 VITALS — BP 130/85 | HR 92 | Temp 98.2°F | Ht 64.02 in | Wt 125.8 lb

## 2023-02-24 DIAGNOSIS — I2510 Atherosclerotic heart disease of native coronary artery without angina pectoris: Secondary | ICD-10-CM | POA: Diagnosis not present

## 2023-02-24 DIAGNOSIS — R911 Solitary pulmonary nodule: Secondary | ICD-10-CM | POA: Insufficient documentation

## 2023-02-24 DIAGNOSIS — I700 Atherosclerosis of aorta: Secondary | ICD-10-CM | POA: Insufficient documentation

## 2023-02-24 DIAGNOSIS — R630 Anorexia: Secondary | ICD-10-CM | POA: Insufficient documentation

## 2023-02-24 DIAGNOSIS — J439 Emphysema, unspecified: Secondary | ICD-10-CM | POA: Diagnosis not present

## 2023-02-24 DIAGNOSIS — R634 Abnormal weight loss: Secondary | ICD-10-CM | POA: Insufficient documentation

## 2023-02-24 LAB — URINALYSIS, ROUTINE W REFLEX MICROSCOPIC
Bilirubin, UA: NEGATIVE
Glucose, UA: NEGATIVE
Ketones, UA: NEGATIVE
Leukocytes,UA: NEGATIVE
Nitrite, UA: NEGATIVE
Protein,UA: NEGATIVE
Specific Gravity, UA: <1.005 — ABNORMAL LOW (ref 1.005–1.030)
Urobilinogen, Ur: 0.2 mg/dL (ref 0.2–1.0)
pH, UA: 5.5 (ref 5.0–7.5)

## 2023-02-24 LAB — WET PREP FOR TRICH, YEAST, CLUE
Clue Cell Exam: NEGATIVE
Trichomonas Exam: NEGATIVE
Yeast Exam: NEGATIVE

## 2023-02-24 LAB — MICROSCOPIC EXAMINATION: Bacteria, UA: NONE SEEN

## 2023-02-24 NOTE — Assessment & Plan Note (Signed)
Ongoing since colonoscopy on 02/06/23, has had a work-up with GI that is reassuring and had CT on 02/06/23 and recent XR that were reassuring.  She is higher risk, smoker and family history of cancer.  Will obtain further labs today: CBC, CMP, TSH, tick borne disease labs and Alpha Gal, iron, ferritin, A1c, UA, Wet prep.  Overall exam today reassuring with no red flags.  Obtain imaging CT lung and abdomen to further assess due to weight loss and higher risk.  If overall this returns with reassuring findings may further delve into mood, as has underlying anxiety at baseline.

## 2023-02-24 NOTE — Progress Notes (Signed)
BP 130/85   Pulse 92   Temp 98.2 F (36.8 C) (Oral)   Ht 5' 4.02" (1.626 m)   Wt 125 lb 12.8 oz (57.1 kg)   SpO2 98%   BMI 21.58 kg/m    Subjective:    Patient ID: Emily Johnston, female    DOB: 1960-10-05, 62 y.o.   MRN: [id number]  HPI: ERISA Emily Johnston is a 62 y.o. female  Chief Complaint  Patient presents with   Sick    Has been feeling bad since her Colonoscopy, having the shakes, fatigue, has not appetite, some abd pain and not feeling good.  Going back to GI next Thursday   ABDOMINAL PAIN  Presents today for ongoing abdominal issues since colonoscopy -- had procedure May 24th.  Reports fatigue, lack of energy, shakes, and increased bowel movements + weight loss.  Has been followed by GI since this time with reassuring full wake-up including labs, stool samples, and imaging.  Weight was 136 lbs in April at last visit with PCP, now is 125 lbs.  Does not feel good and has no energy.  Reports her temperature has been up for her at home -- 98-99 range.  No recent tick bites.  Reports being afraid to go anywhere, as afraid will have bowel movement in pants.  Having lots of flatulence.  Was having abdominal pain, but this has improved.   Duration:weeks Onset: gradual Episode duration: since 02/06/23 Frequency: constant Alleviating factors: laying down Aggravating factors: nothing Status: fluctuating Treatments attempted: Imodium - Bentyl (started by GI), probiotic, Omeprazole Fever:  she feels like having one Nausea: yes Vomiting: no -- takes Zofran as needed Weight loss: yes Decreased appetite: yes Diarrhea: yes Constipation: no Blood in stool:  x one episode initially after colonoscopy Heartburn: yes Jaundice: no Rash: per baseline with her psoriasis Dysuria/urinary frequency: yes Hematuria: no History of sexually transmitted disease: no Recurrent NSAID use: no   Relevant past medical, surgical, family and social history reviewed and updated as  indicated. Interim medical history since our last visit reviewed. Allergies and medications reviewed and updated.  Review of Systems  Constitutional:  Positive for activity change, appetite change, chills, fatigue and fever (subjective).  HENT: Negative.    Respiratory:  Negative for cough, chest tightness, shortness of breath and wheezing.   Cardiovascular:  Negative for chest pain, palpitations and leg swelling.  Gastrointestinal:  Positive for abdominal distention, diarrhea, nausea and vomiting. Negative for abdominal pain, blood in stool, constipation and rectal pain.  Endocrine: Negative for cold intolerance and heat intolerance.  Musculoskeletal:  Positive for myalgias.  Skin:  Positive for rash (per baseline).  Neurological:  Positive for weakness. Negative for headaches.  Psychiatric/Behavioral:  Negative for decreased concentration, sleep disturbance and suicidal ideas. The patient is nervous/anxious.     Per HPI unless specifically indicated above     Objective:    BP 130/85   Pulse 92   Temp 98.2 F (36.8 C) (Oral)   Ht 5' 4.02" (1.626 m)   Wt 125 lb 12.8 oz (57.1 kg)   SpO2 98%   BMI 21.58 kg/m   Wt Readings from Last 3 Encounters:  02/24/23 125 lb 12.8 oz (57.1 kg)  02/17/23 127 lb (57.6 kg)  02/07/23 126 lb 15.8 oz (57.6 kg)    Physical Exam Vitals and nursing note reviewed. Exam conducted with a chaperone present.  Constitutional:      General: She is awake. She is not in acute distress.  Appearance: She is well-developed and well-groomed. She is not ill-appearing or toxic-appearing.  HENT:     Head: Normocephalic and atraumatic.     Right Ear: Hearing, tympanic membrane, ear canal and external ear normal. No drainage.     Left Ear: Hearing, tympanic membrane, ear canal and external ear normal. No drainage.     Nose: Nose normal.     Right Sinus: No maxillary sinus tenderness or frontal sinus tenderness.     Left Sinus: No maxillary sinus tenderness or  frontal sinus tenderness.     Mouth/Throat:     Mouth: Mucous membranes are moist.     Pharynx: Oropharynx is clear. Uvula midline. No pharyngeal swelling, oropharyngeal exudate or posterior oropharyngeal erythema.  Eyes:     General: Lids are normal.        Right eye: No discharge.        Left eye: No discharge.     Extraocular Movements: Extraocular movements intact.     Conjunctiva/sclera: Conjunctivae normal.     Pupils: Pupils are equal, round, and reactive to light.     Visual Fields: Right eye visual fields normal and left eye visual fields normal.  Neck:     Thyroid: No thyromegaly.     Vascular: No carotid bruit.     Trachea: Trachea normal.  Cardiovascular:     Rate and Rhythm: Normal rate and regular rhythm.     Heart sounds: Normal heart sounds. No murmur heard.    No gallop.  Pulmonary:     Effort: Pulmonary effort is normal. No accessory muscle usage or respiratory distress.     Breath sounds: Normal breath sounds.  Chest:  Breasts:    Right: Normal.     Left: Normal.  Abdominal:     General: Bowel sounds are normal. There is no distension.     Palpations: Abdomen is soft. There is no hepatomegaly or splenomegaly.     Tenderness: There is no abdominal tenderness.  Musculoskeletal:        General: Normal range of motion.     Cervical back: Normal range of motion and neck supple.     Right lower leg: No edema.     Left lower leg: No edema.  Lymphadenopathy:     Head:     Right side of head: No submental, submandibular, tonsillar, preauricular or posterior auricular adenopathy.     Left side of head: No submental, submandibular, tonsillar, preauricular or posterior auricular adenopathy.     Cervical: No cervical adenopathy.     Upper Body:     Right upper body: No supraclavicular, axillary or pectoral adenopathy.     Left upper body: No supraclavicular, axillary or pectoral adenopathy.  Skin:    General: Skin is warm and dry.     Capillary Refill: Capillary  refill takes less than 2 seconds.     Findings: No rash.  Neurological:     Mental Status: She is alert and oriented to person, place, and time.     Gait: Gait is intact.     Deep Tendon Reflexes: Reflexes are normal and symmetric.     Reflex Scores:      Brachioradialis reflexes are 2+ on the right side and 2+ on the left side.      Patellar reflexes are 2+ on the right side and 2+ on the left side. Psychiatric:        Attention and Perception: Attention normal.        Mood  and Affect: Mood normal.        Speech: Speech normal.        Behavior: Behavior normal. Behavior is cooperative.        Thought Content: Thought content normal.        Judgment: Judgment normal.     Results for orders placed or performed in visit on 02/17/23  C difficile Toxins A+B W/Rflx   ST  Result Value Ref Range   C difficile Toxins A+B, EIA Negative Negative  C difficile, Cytotoxin B   ST  Result Value Ref Range   C diff Toxin B Final report    Result 1 Comment   GI Profile, Stool, PCR  Result Value Ref Range   Campylobacter Not Detected Not Detected   C difficile toxin A/B Not Detected Not Detected   Plesiomonas shigelloides Not Detected Not Detected   Salmonella Not Detected Not Detected   Vibrio Not Detected Not Detected   Vibrio cholerae Not Detected Not Detected   Yersinia enterocolitica Not Detected Not Detected   Enteroaggregative E coli Not Detected Not Detected   Enteropathogenic E coli Not Detected Not Detected   Enterotoxigenic E coli Not Detected Not Detected   Shiga-toxin-producing E coli Not Detected Not Detected   E coli O157 Not applicable Not Detected   Shigella/Enteroinvasive E coli Not Detected Not Detected   Cryptosporidium Not Detected Not Detected   Cyclospora cayetanensis Not Detected Not Detected   Entamoeba histolytica Not Detected Not Detected   Giardia lamblia Not Detected Not Detected   Adenovirus F 40/41 Not Detected Not Detected   Astrovirus Not Detected Not  Detected   Norovirus GI/GII Not Detected Not Detected   Rotavirus A Not Detected Not Detected   Sapovirus Not Detected Not Detected      Assessment & Plan:   Problem List Items Addressed This Visit       Other   Decreased appetite - Primary    Ongoing since colonoscopy on 02/06/23, has had a work-up with GI that is reassuring and had CT on 02/06/23 and recent XR that were reassuring.  She is higher risk, smoker and family history of cancer.  Will obtain further labs today: CBC, CMP, TSH, tick borne disease labs and Alpha Gal, iron, ferritin, A1c, UA, Wet prep.  Overall exam today reassuring with no red flags.  Obtain imaging CT lung and abdomen to further assess due to weight loss and higher risk.  If overall this returns with reassuring findings may further delve into mood, as has underlying anxiety at baseline.      Relevant Orders   WET PREP FOR TRICH, YEAST, CLUE   Urinalysis, Routine w reflex microscopic   Comprehensive metabolic panel   CBC with Differential/Platelet   C-reactive protein   Sed Rate (ESR)   Ehrlichia antibody panel   Spotted Fever Group Antibodies   Lyme disease, western blot   Alpha-Gal Panel   HgB A1c   Iron Binding Cap (TIBC)([facility])   Ferritin   TSH   Unintended weight loss    Over 10 lbs lost in past 1 1/2 months.  Ongoing since colonoscopy on 02/06/23, has had a work-up with GI that is reassuring and had CT on 02/06/23 and recent XR that were reassuring.  She is higher risk, smoker and family history of cancer.  Will obtain further labs today: CBC, CMP, TSH, tick borne disease labs and Alpha Gal, iron, ferritin, A1c, UA, Wet prep.  Overall exam today reassuring with no  red flags.  Obtain imaging CT lung and abdomen to further assess due to weight loss and higher risk.  If overall this returns with reassuring findings may further delve into mood, as has underlying anxiety at baseline.      Relevant Orders   CT Abdomen Pelvis Wo Contrast   CT Chest  Wo Contrast     Follow up plan: Return in about 2 weeks (around 03/10/2023) for WEIGHT LOSS AND DECREASED APPETITE.

## 2023-02-24 NOTE — Progress Notes (Signed)
Contacted via MyChart   Good afternoon, both urine and wet prep are overall stable.  A little blood in urine, which we will monitor.  No infection.

## 2023-02-24 NOTE — Progress Notes (Signed)
Contacted via MyChart   Good evening Emily Johnston, your imaging has returned: - Lung imaging shows the ongoing nodule to left lower lobe with no changes, I recommend keeping your current schedule repeat imaging in August for this.  No other acute findings. - Abdominal imaging shows no acute findings, no lymph node enlargement or masses.  Good news!!  There are some clips, but this is from recent polyp removal.  Overall stable imaging.  We will see what labs show [place].  Any questions?

## 2023-02-24 NOTE — Assessment & Plan Note (Addendum)
Over 10 lbs lost in past 1 1/2 months.  Ongoing since colonoscopy on 02/06/23, has had a work-up with GI that is reassuring and had CT on 02/06/23 and recent XR that were reassuring.  She is higher risk, smoker and family history of cancer.  Will obtain further labs today: CBC, CMP, TSH, tick borne disease labs and Alpha Gal, iron, ferritin, A1c, UA, Wet prep.  Overall exam today reassuring with no red flags.  Obtain imaging CT lung and abdomen to further assess due to weight loss and higher risk.  If overall this returns with reassuring findings may further delve into mood, as has underlying anxiety at baseline.

## 2023-02-25 ENCOUNTER — Encounter: Payer: Self-pay | Admitting: Nurse Practitioner

## 2023-02-25 LAB — LYME DISEASE, WESTERN BLOT

## 2023-02-25 LAB — COMPREHENSIVE METABOLIC PANEL
Albumin/Globulin Ratio: 2
BUN/Creatinine Ratio: 14 (ref 12–28)
Bilirubin Total: 0.3 mg/dL (ref 0.0–1.2)
CO2: 28 mmol/L (ref 20–29)
Chloride: 102 mmol/L (ref 96–106)
Glucose: 66 mg/dL — ABNORMAL LOW (ref 70–99)
Total Protein: 6.9 g/dL (ref 6.0–8.5)

## 2023-02-25 LAB — C-REACTIVE PROTEIN: CRP: <1 mg/L (ref 0–10)

## 2023-02-25 LAB — EHRLICHIA ANTIBODY PANEL: HGE IgG Titer: NEGATIVE

## 2023-02-25 LAB — CBC WITH DIFFERENTIAL/PLATELET
Lymphocytes Absolute: 2.3 10*3/uL (ref 0.7–3.1)
WBC: 6.4 10*3/uL (ref 3.4–10.8)

## 2023-02-25 LAB — IRON AND TIBC
Total Iron Binding Capacity: 358 ug/dL (ref 250–450)
UIBC: 272 ug/dL (ref 118–369)

## 2023-02-25 LAB — FERRITIN: Ferritin: 117 ng/mL (ref 15–150)

## 2023-02-25 LAB — ALPHA-GAL PANEL

## 2023-02-25 NOTE — Progress Notes (Signed)
Contacted via MyChart   Good afternoon Tamaria, your labs area returning, some are still pending (tick labs): - Kidney function, creatinine and eGFR, remains normal, as is liver function, AST and ALT.  - Thyroid lab, TSH, a little low.  I would recommend we recheck this in two weeks at visit.  Hyperthyroid could cause some of your symptoms, however this is only mild low so unsure it is accurate. - Inflammatory labs, CRP and ESR, are normal. - CBC shows no anemia or infection. Iron level normal. - A1c shows no diabetes or prediabetes. - We are waiting on tick borne disease labs and will let you know when these return.  Then we can determine next steps, but currently only off thing is thyroid (which is mild low). Keep being awesome!!  Thank you for allowing me to participate in your care.  I appreciate you. Kindest regards, Wylma Tatem

## 2023-02-26 ENCOUNTER — Ambulatory Visit (INDEPENDENT_AMBULATORY_CARE_PROVIDER_SITE_OTHER): Payer: 59 | Admitting: Physician Assistant

## 2023-02-26 ENCOUNTER — Encounter: Payer: Self-pay | Admitting: Physician Assistant

## 2023-02-26 VITALS — BP 106/79 | HR 102 | Temp 98.4°F | Ht 62.0 in | Wt 125.2 lb

## 2023-02-26 DIAGNOSIS — R197 Diarrhea, unspecified: Secondary | ICD-10-CM

## 2023-02-26 DIAGNOSIS — Z86010 Personal history of colonic polyps: Secondary | ICD-10-CM | POA: Diagnosis not present

## 2023-02-26 DIAGNOSIS — K580 Irritable bowel syndrome with diarrhea: Secondary | ICD-10-CM

## 2023-02-26 DIAGNOSIS — R1084 Generalized abdominal pain: Secondary | ICD-10-CM

## 2023-02-26 MED ORDER — DIPHENOXYLATE-ATROPINE 2.5-0.025 MG PO TABS
1.0000 | ORAL_TABLET | Freq: Two times a day (BID) | ORAL | 0 refills | Status: DC
Start: 2023-02-26 — End: 2023-03-06

## 2023-02-26 MED ORDER — DICYCLOMINE HCL 20 MG PO TABS
20.0000 mg | ORAL_TABLET | Freq: Three times a day (TID) | ORAL | 2 refills | Status: DC
Start: 2023-02-26 — End: 2023-05-04

## 2023-02-26 NOTE — Progress Notes (Signed)
Celso Amy, PA-C [address]  [address]  [place], [place] [address]  Main: [phone]  Fax: [phone]   Primary Care Physician: Marjie Skiff, NP  Primary Gastroenterologist:  Dr. Wyline Mood / Celso Amy, PA-C   CC:  F/U Diarrhea & Abdominal Pain  HPI: Emily Johnston is a 62 y.o. female returns for 5-week follow-up of melena, epigastric pain, and diarrhea.  Medical history significant for COPD, fibromyalgia, IBS, PTSD, tobacco dependence.  Normal hemoglobin 14.2.  Abdominal pelvic CT showed no acute finding.  Has had ongoing chronic loose stools and abdominal pain.  Extensive GI workup unrevealing.  Negative GI pathogen panel.  Negative C. difficile.  Normal labs.  Was started on glycopyrrolate 02/06/2023 (Not taking).  She is taking Dicyclomine 10mg  QID with no benefit.  Continues to smoke 1 pack cigarettes daily for 41 years.  EGD 02/06/2023 by Dr. Tobi Bastos showed hiatal hernia and a 5 mm bruner's gland polyp in the duodenal bulb.  Normal stomach and esophagus.  Colonoscopy 02/06/2023 showed 12 tubular adenoma polyps removed (size 3mm - 18mm) from cecum, ascending colon, and transverse colon.  Clips were placed.  She went to the emergency room on 02/06/2023 with abdominal pain after the EGD & Colon procedures.  She had a repeat CT scan of the abdomen and pelvis which showed no perforation, no acute findings. Hemoglobin and CMP within normal limits.   She reported persistent weight loss and constant bowel movements.  She underwent another CT of the chest, abdomen, and pelvis for evaluation on 02/24/2023.  There were small bilateral pulmonary nodules and large groundglass nodule in the left lower lobe.  Repeat chest CT was recommended in 6 months.  Mild upper lobe emphysema.  No acute abnormality in the abdomen or pelvis.   Current Outpatient Medications  Medication Sig Dispense Refill   acitretin (SORIATANE) 25 MG capsule Take 1 capsule by mouth daily. (Patient not  taking: Reported on 02/24/2023)     albuterol (VENTOLIN HFA) 108 (90 Base) MCG/ACT inhaler Inhale 2 puffs into the lungs every 6 (six) hours as needed for wheezing or shortness of breath. 1 each 3   Cholecalciferol 1.25 MG (50000 UT) TABS Take 1 tablet by mouth once a week. 12 tablet 4   clobetasol ointment (TEMOVATE) 0.05 % Apply 1 application. topically 2 (two) times daily.     clonazePAM (KLONOPIN) 0.5 MG tablet Take 1 tablet (0.5 mg total) by mouth 2 (two) times daily as needed for anxiety. (Patient not taking: Reported on 02/24/2023) 60 tablet 0   dicyclomine (BENTYL) 10 MG capsule Take 1 capsule (10 mg total) by mouth in the morning, at noon, in the evening, and at bedtime. 90 capsule 0   DULoxetine (CYMBALTA) 60 MG capsule Take 2 capsules (120 mg total) by mouth daily. (Patient not taking: Reported on 02/24/2023) 180 capsule 4   fluticasone (FLONASE) 50 MCG/ACT nasal spray SPRAY 2 SPRAYS INTO EACH NOSTRIL EVERY DAY 48 mL 1   Fluticasone-Umeclidin-Vilant (TRELEGY ELLIPTA) 200-62.5-25 MCG/ACT AEPB Inhale 1 puff into the lungs daily. 60 each 11   folic acid (FOLVITE) 1 MG tablet Take 1 mg by mouth daily.     hydrocortisone 1 % ointment Apply 1 Application topically 2 (two) times daily. 30 g 0   lactobacillus acidophilus & bulgar (LACTINEX) chewable tablet Chew 1 tablet by mouth 3 (three) times daily with meals. 90 tablet 12   leflunomide (ARAVA) 20 MG tablet Take 20 mg by mouth daily.  meclizine (ANTIVERT) 12.5 MG tablet Take 1 tablet (12.5 mg total) by mouth 3 (three) times daily as needed for dizziness. 60 tablet 1   montelukast (SINGULAIR) 10 MG tablet Take 1 tablet (10 mg total) by mouth at bedtime. 90 tablet 4   Multiple Vitamin (MULTIVITAMIN) tablet Take 1 tablet by mouth daily.     omeprazole (PRILOSEC) 40 MG capsule Take 1 capsule (40 mg total) by mouth in the morning and at bedtime. 60 capsule 0   ondansetron (ZOFRAN) 4 MG tablet Take 1 tablet (4 mg total) by mouth daily as needed for  nausea or vomiting. 30 tablet 1   OTEZLA 30 MG TABS Take 1 tablet by mouth 2 (two) times daily.     oxyCODONE-acetaminophen (PERCOCET) 5-325 MG tablet Take 1 tablet by mouth every 4 (four) hours as needed for severe pain. (Patient not taking: Reported on 02/24/2023) 12 tablet 0   pantoprazole (PROTONIX) 40 MG tablet Take 1 tablet (40 mg total) by mouth daily. 30 tablet 2   rosuvastatin (CRESTOR) 10 MG tablet Take 1 tablet (10 mg total) by mouth daily. 30 tablet 6   sucralfate (CARAFATE) 1 g tablet Take 1 tablet (1 g total) by mouth 4 (four) times daily. (Patient not taking: Reported on 02/24/2023) 120 tablet 0   tacrolimus (PROTOPIC) 0.1 % ointment 2 (two) times daily.     No current facility-administered medications for this visit.    Allergies as of 02/26/2023   (No Known Allergies)    Past Medical History:  Diagnosis Date   Anxiety    Arthritis    COPD (chronic obstructive pulmonary disease) (HCC)    Degenerative disc disease, cervical    Depression    Fibromyalgia    GERD (gastroesophageal reflux disease)    HLD (hyperlipidemia)    IBS (irritable bowel syndrome)    Psoriasis    PTSD (post-traumatic stress disorder)     Past Surgical History:  Procedure Laterality Date   ABDOMINAL HYSTERECTOMY     CARPAL TUNNEL RELEASE     COLONOSCOPY WITH PROPOFOL N/A 02/06/2023   Procedure: COLONOSCOPY WITH PROPOFOL;  Surgeon: Wyline Mood, MD;  Location: [facility] ENDOSCOPY;  Service: Endoscopy;  Laterality: N/A;   ESOPHAGOGASTRODUODENOSCOPY (EGD) WITH PROPOFOL N/A 02/06/2023   Procedure: ESOPHAGOGASTRODUODENOSCOPY (EGD) WITH PROPOFOL;  Surgeon: Wyline Mood, MD;  Location: [facility] ENDOSCOPY;  Service: Endoscopy;  Laterality: N/A;   KNEE SURGERY     NECK SURGERY      Review of Systems:    All systems reviewed and negative except where noted in HPI.   Physical Examination:   There were no vitals taken for this visit.  General: Well-nourished, well-developed in no acute distress.  Eyes: No  icterus. Conjunctivae pink. Mouth: Oropharyngeal mucosa moist and pink , no lesions erythema or exudate. Lungs: Clear to auscultation bilaterally. Non-labored. Heart: Regular rate and rhythm, no murmurs rubs or gallops.  Abdomen: Bowel sounds are normal; Abdomen is Soft; No hepatosplenomegaly, masses or hernias;  There is mild Left Sided Tenderness; No tenderness on the right side of abdomen; No guarding or rebound tenderness. Extremities: No lower extremity edema. No clubbing or deformities. Neuro: Alert and oriented x 3.  Grossly intact. Skin: Warm and dry, no jaundice.   Psych: Alert and cooperative, very anxious mood and affect; somewhat shaky.   Imaging Studies: CT CHEST ABDOMEN PELVIS WO CONTRAST  Result Date: 02/24/2023 CLINICAL DATA:  Unintentional weight loss. Constant bowel movements. EXAM: CT CHEST, ABDOMEN AND PELVIS WITHOUT CONTRAST TECHNIQUE: Multidetector CT  imaging of the chest, abdomen and pelvis was performed following the standard protocol without IV contrast. RADIATION DOSE REDUCTION: This exam was performed according to the departmental dose-optimization program which includes automated exposure control, adjustment of the mA and/or kV according to patient size and/or use of iterative reconstruction technique. COMPARISON:  None Available. FINDINGS: CT CHEST FINDINGS Cardiovascular: Coronary artery calcification and aortic atherosclerotic calcification. Mediastinum/Nodes: No axillary or supraclavicular adenopathy. No mediastinal or hilar adenopathy. No pericardial fluid. Esophagus normal. Lungs/Pleura: Sub solid/ground-glass nodule in the LEFT lobe measures 6 mm on image 66/series 3. 5 mm solid nodule in the LEFT lower lobe on image 100/series 3. Mild centrilobular emphysema in the upper lobes. 2 mm solid nodule in the RIGHT upper lobe on image 57 Musculoskeletal: No aggressive osseous lesion. CT ABDOMEN AND PELVIS FINDINGS Hepatobiliary: No focal hepatic lesion on noncontrast exam.  Gallbladder normal. Pancreas: Pancreas is normal. No ductal dilatation. No pancreatic inflammation. Spleen: Normal spleen Adrenals/urinary tract: Adrenal glands normal. Kidneys, ureters and bladder normal. Stomach/Bowel: The stomach, duodenum, and small bowel normal. Ascending Metallic density in the cecum presumed a polypectomy clip. Ascending, transverse and descending colon normal. Several additional metallic linear densities in the colon. Rectum normal. Vascular/Lymphatic: Abdominal aorta is normal caliber. There is no retroperitoneal or periportal lymphadenopathy. No pelvic lymphadenopathy. Reproductive: Post hysterectomy.  Adnexa unremarkable Other: No free fluid. Musculoskeletal: No aggressive osseous lesion. IMPRESSION: CHEST IMPRESSION: 1. Small bilateral pulmonary nodules. Large ground-glass nodule in the LEFT lower lobe. Initial follow-up with CT at 6 months is recommended to confirm persistence. If persistent, repeat CT is recommended every 2 years until 5 years of stability has been established. This recommendation follows the consensus statement: Guidelines for Management of Incidental Pulmonary Nodules Detected on CT Images: From the Fleischner Society 2017; Radiology 2017; 284:228-243. 2. Mild upper lobe emphysema. 3. No lymphadenopathy. PELVIS IMPRESSION: 1. No acute findings in the abdomen pelvis on noncontrast exam. 2. No lymphadenopathy. 3. Metallic density in the colon  presumed polypectomy clips. 4. Aortic atherosclerosis. Aortic Atherosclerosis (ICD10-I70.0) and Emphysema (ICD10-J43.9). Electronically Signed   By: Genevive Bi M.D.   On: 02/24/2023 15:57   DG Abd 2 Views  Result Date: 02/16/2023 CLINICAL DATA:  Epigastric and lower abdominal pain EXAM: ABDOMEN - 2 VIEW COMPARISON:  CT abdomen pelvis 02/06/2023 FINDINGS: Lung bases are clear. Gas is demonstrated within nondilated loops of large and small bowel in a nonobstructed pattern. Stool scattered throughout the colon. Pelvic  phleboliths. Lumbar spine degenerative changes. IMPRESSION: Nonobstructed bowel-gas pattern. Electronically Signed   By: Annia Belt M.D.   On: 02/16/2023 21:35   CT ABDOMEN PELVIS W CONTRAST  Result Date: 02/06/2023 CLINICAL DATA:  Postoperative abdominal pain and bleeding following endoscopy and colonoscopy EXAM: CT ABDOMEN AND PELVIS WITH CONTRAST TECHNIQUE: Multidetector CT imaging of the abdomen and pelvis was performed using the standard protocol following bolus administration of intravenous contrast. RADIATION DOSE REDUCTION: This exam was performed according to the departmental dose-optimization program which includes automated exposure control, adjustment of the mA and/or kV according to patient size and/or use of iterative reconstruction technique. CONTRAST:  OMNIPAQUE IOHEXOL 300 MG/ML  SOLN COMPARISON:  01/06/2023 FINDINGS: Lower chest: No acute abnormality. Hepatobiliary: No solid liver abnormality is seen. No gallstones, gallbladder wall thickening, or biliary dilatation. Pancreas: Unremarkable. No pancreatic ductal dilatation or surrounding inflammatory changes. Spleen: Normal in size without significant abnormality. Adrenals/Urinary Tract: Adrenal glands are unremarkable. Kidneys are normal, without renal calculi, solid lesion, or hydronephrosis. Bladder is unremarkable. Stomach/Bowel: Stomach  is within normal limits. Appendix not clearly visualized. No evidence of bowel wall thickening, distention, or inflammatory changes. Multiple biopsy clips within the colon (series 2, image 46). Vascular/Lymphatic: Aortic atherosclerosis. No enlarged abdominal or pelvic lymph nodes. Reproductive: Status post hysterectomy. Other: No abdominal wall hernia or abnormality. No ascites. Musculoskeletal: No acute or significant osseous findings. IMPRESSION: 1. No acute CT findings of the abdomen or pelvis to explain abdominal pain or bleeding. 2. Multiple biopsy clips within the colon, in keeping with  reported polyp removal. 3. Status post hysterectomy. Aortic Atherosclerosis (ICD10-I70.0). Electronically Signed   By: Jearld Lesch M.D.   On: 02/06/2023 15:15    Assessment and Plan:   MAIGAN BREMNER is a 62 y.o. y/o female   1.  Adenomatous colon polyps -12 tubular adenoma polyps removed during colonoscopy 02/06/2023 ranging in size from 3 mm to 8 mm.  Repeat colonoscopy in 1 year.  2.  Weight loss - No GI etiology found on EGD, colonoscopy, or CT scans.  Uncertain etiology.  3 negative abdominal pelvic CT scans in the past 2 months.  FollowUp with PCP.  3.  Constant bowel movements and Abdominal Pain - Likely due to irritable bowel syndrome.  Reassurance regarding negative GI pathogen panel and negative C. difficile stool test.  Assurance regarding negative CT scans and normal labs.  Increase dicyclomine to 20 Mg 3 times daily as needed abdominal pain.  Try Lomotil 1 tablet twice daily to help control diarrhea.  4.  COPD  Encouraged smoking cessation and follow-up with pulmonology.  5.  Pulmonary nodules  Follow-up with PCP and pulmonologist.    Celso Amy, PA-C  Follow up in 4 weeks with TG.  3 months with Dr. Tobi Bastos.

## 2023-02-26 NOTE — Patient Instructions (Addendum)
I sent prescription for Lomotil 1 tablet twice daily to your pharmacy to see if this will help your diarrhea.  You can increase Dicyclomine to 20mg  1 tablet 3 times daily.  I sent prescription for higher dose.

## 2023-02-27 ENCOUNTER — Encounter: Payer: Self-pay | Admitting: Nurse Practitioner

## 2023-02-27 LAB — CBC WITH DIFFERENTIAL/PLATELET
Basophils Absolute: 0.1 10*3/uL (ref 0.0–0.2)
EOS (ABSOLUTE): 0.2 10*3/uL (ref 0.0–0.4)
Lymphs: 35 %
Monocytes Absolute: 0.6 10*3/uL (ref 0.1–0.9)
Neutrophils: 52 %

## 2023-02-27 LAB — COMPREHENSIVE METABOLIC PANEL
AST: 15 IU/L (ref 0–40)
Calcium: 10.4 mg/dL — ABNORMAL HIGH (ref 8.7–10.3)

## 2023-02-27 LAB — LYME DISEASE, WESTERN BLOT
IgG P30 Ab.: ABSENT
IgG P45 Ab.: ABSENT
IgG P58 Ab.: ABSENT
IgG P66 Ab.: ABSENT
IgM P23 Ab.: ABSENT
Lyme IgG Wb: NEGATIVE

## 2023-02-27 LAB — EHRLICHIA ANTIBODY PANEL: HGE IgM Titer: NEGATIVE

## 2023-02-27 LAB — ALPHA-GAL PANEL
Allergen Lamb IgE: <0.1 kU/L
Pork IgE: <0.1 kU/L

## 2023-02-27 LAB — HEMOGLOBIN A1C: Est. average glucose Bld gHb Est-mCnc: 111 mg/dL

## 2023-02-27 NOTE — Progress Notes (Signed)
Contacted via MyChart   All tick borne disease testing is negative.

## 2023-03-01 LAB — EHRLICHIA ANTIBODY PANEL
E. Chaffeensis (HME) IgM Titer: NEGATIVE
E.Chaffeensis (HME) IgG: NEGATIVE

## 2023-03-01 LAB — CBC WITH DIFFERENTIAL/PLATELET
Basos: 1 %
Eos: 2 %
Hematocrit: 43 % (ref 34.0–46.6)
Hemoglobin: 14.2 g/dL (ref 11.1–15.9)
Immature Grans (Abs): 0 10*3/uL (ref 0.0–0.1)
Immature Granulocytes: 0 %
MCH: 31.6 pg (ref 26.6–33.0)
MCHC: 33 g/dL (ref 31.5–35.7)
MCV: 96 fL (ref 79–97)
Monocytes: 10 %
Neutrophils Absolute: 3.3 10*3/uL (ref 1.4–7.0)
Platelets: 212 10*3/uL (ref 150–450)
RBC: 4.5 x10E6/uL (ref 3.77–5.28)
RDW: 13 % (ref 11.7–15.4)

## 2023-03-01 LAB — LYME DISEASE, WESTERN BLOT
IgG P18 Ab.: ABSENT
IgG P23 Ab.: ABSENT
IgG P28 Ab.: ABSENT
IgG P41 Ab.: ABSENT
IgG P93 Ab.: ABSENT
IgM P41 Ab.: ABSENT
Lyme IgM Wb: NEGATIVE

## 2023-03-01 LAB — HEMOGLOBIN A1C: Hgb A1c MFr Bld: 5.5 % (ref 4.8–5.6)

## 2023-03-01 LAB — COMPREHENSIVE METABOLIC PANEL
ALT: 13 IU/L (ref 0–32)
Albumin: 4.6 g/dL (ref 3.9–4.9)
Alkaline Phosphatase: 121 IU/L (ref 44–121)
BUN: 12 mg/dL (ref 8–27)
Creatinine, Ser: 0.86 mg/dL (ref 0.57–1.00)
Globulin, Total: 2.3 g/dL (ref 1.5–4.5)
Potassium: 3.8 mmol/L (ref 3.5–5.2)
Sodium: 143 mmol/L (ref 134–144)
eGFR: 77 mL/min/{1.73_m2} (ref >59)

## 2023-03-01 LAB — SPOTTED FEVER GROUP ANTIBODIES
Spotted Fever Group IgG: <1:64 {titer}
Spotted Fever Group IgM: <1:64 {titer}

## 2023-03-01 LAB — SEDIMENTATION RATE: Sed Rate: 34 mm/hr (ref 0–40)

## 2023-03-01 LAB — IRON AND TIBC
Iron Saturation: 24 % (ref 15–55)
Iron: 86 ug/dL (ref 27–139)

## 2023-03-01 LAB — ALPHA-GAL PANEL
Beef IgE: <0.1 kU/L
IgE (Immunoglobulin E), Serum: 31 IU/mL (ref 6–495)
O215-IgE Alpha-Gal: <0.1 kU/L

## 2023-03-01 LAB — TSH: TSH: 0.393 u[IU]/mL — ABNORMAL LOW (ref 0.450–4.500)

## 2023-03-06 ENCOUNTER — Ambulatory Visit: Payer: Self-pay | Admitting: *Deleted

## 2023-03-06 ENCOUNTER — Emergency Department
Admission: EM | Admit: 2023-03-06 | Discharge: 2023-03-06 | Disposition: A | Discharge status: Home / Self Care | Payer: 59 | Attending: Emergency Medicine | Admitting: Emergency Medicine

## 2023-03-06 ENCOUNTER — Ambulatory Visit
Admit: 2023-03-06 | Discharge: 2023-03-07 | Payer: MEDICARE | Attending: Orthopaedic Surgery | Primary: Orthopaedic Surgery

## 2023-03-06 DIAGNOSIS — M48062 Spinal stenosis, lumbar region with neurogenic claudication: Principal | ICD-10-CM

## 2023-03-06 DIAGNOSIS — R946 Abnormal results of thyroid function studies: Secondary | ICD-10-CM | POA: Insufficient documentation

## 2023-03-06 DIAGNOSIS — F439 Reaction to severe stress, unspecified: Secondary | ICD-10-CM | POA: Diagnosis not present

## 2023-03-06 DIAGNOSIS — R197 Diarrhea, unspecified: Secondary | ICD-10-CM | POA: Diagnosis present

## 2023-03-06 DIAGNOSIS — K529 Noninfective gastroenteritis and colitis, unspecified: Secondary | ICD-10-CM | POA: Diagnosis not present

## 2023-03-06 LAB — COMPREHENSIVE METABOLIC PANEL
ALT: 12 U/L (ref 0–44)
AST: 19 U/L (ref 15–41)
Albumin: 3.7 g/dL (ref 3.5–5.0)
Alkaline Phosphatase: 91 U/L (ref 38–126)
Anion gap: 8 (ref 5–15)
BUN: 10 mg/dL (ref 8–23)
CO2: 25 mmol/L (ref 22–32)
Calcium: 8.8 mg/dL — ABNORMAL LOW (ref 8.9–10.3)
Chloride: 102 mmol/L (ref 98–111)
Creatinine, Ser: 0.72 mg/dL (ref 0.44–1.00)
GFR, Estimated: >60 mL/min (ref >60)
Glucose, Bld: 108 mg/dL — ABNORMAL HIGH (ref 70–99)
Potassium: 3 mmol/L — ABNORMAL LOW (ref 3.5–5.1)
Sodium: 135 mmol/L (ref 135–145)
Total Bilirubin: 0.6 mg/dL (ref 0.3–1.2)
Total Protein: 6.6 g/dL (ref 6.5–8.1)

## 2023-03-06 LAB — CBC
HCT: 39.5 % (ref 36.0–46.0)
Hemoglobin: 13.1 g/dL (ref 12.0–15.0)
MCH: 31.2 pg (ref 26.0–34.0)
MCHC: 33.2 g/dL (ref 30.0–36.0)
MCV: 94 fL (ref 80.0–100.0)
Platelets: 174 10*3/uL (ref 150–400)
RBC: 4.2 MIL/uL (ref 3.87–5.11)
RDW: 12.7 % (ref 11.5–15.5)
WBC: 5.2 10*3/uL (ref 4.0–10.5)
nRBC: 0 % (ref 0.0–0.2)

## 2023-03-06 LAB — URINALYSIS, ROUTINE W REFLEX MICROSCOPIC
Bacteria, UA: NONE SEEN
Bilirubin Urine: NEGATIVE
Glucose, UA: NEGATIVE mg/dL
Ketones, ur: NEGATIVE mg/dL
Leukocytes,Ua: NEGATIVE
Nitrite: NEGATIVE
Protein, ur: NEGATIVE mg/dL
Specific Gravity, Urine: 1.011 (ref 1.005–1.030)
pH: 5 (ref 5.0–8.0)

## 2023-03-06 LAB — TYPE AND SCREEN

## 2023-03-06 LAB — TSH: TSH: 1.001 u[IU]/mL (ref 0.350–4.500)

## 2023-03-06 LAB — T4, FREE: Free T4: 0.94 ng/dL (ref 0.61–1.12)

## 2023-03-06 LAB — PHOSPHORUS: Phosphorus: 3.8 mg/dL (ref 2.5–4.6)

## 2023-03-06 LAB — MAGNESIUM: Magnesium: 2 mg/dL (ref 1.7–2.4)

## 2023-03-06 MED ORDER — SODIUM CHLORIDE 0.9 % IV BOLUS
1000.0000 mL | Freq: Once | INTRAVENOUS | Status: AC
  Administered 2023-03-06: 1000 mL via INTRAVENOUS

## 2023-03-06 MED ORDER — DIPHENOXYLATE-ATROPINE 2.5-0.025 MG PO TABS: 1.0000 | ORAL_TABLET | Freq: Four times a day (QID) | ORAL | 1 refills | Status: DC | PRN

## 2023-03-06 MED ORDER — KETOROLAC TROMETHAMINE 30 MG/ML IJ SOLN
15.0000 mg | Freq: Once | INTRAMUSCULAR | Status: AC
  Administered 2023-03-06: 15 mg via INTRAVENOUS
  Filled 2023-03-06: qty 1

## 2023-03-06 MED ORDER — DIPHENOXYLATE-ATROPINE 2.5-0.025 MG PO TABS
2.0000 | ORAL_TABLET | Freq: Once | ORAL | Status: AC
  Administered 2023-03-06: 2 via ORAL
  Filled 2023-03-06: qty 2

## 2023-03-06 MED ORDER — HYOSCYAMINE SULFATE ER 0.375 MG PO TB12: 0.3750 mg | ORAL_TABLET | Freq: Two times a day (BID) | ORAL | 0 refills | Status: DC

## 2023-03-06 MED ORDER — HYOSCYAMINE SULFATE 0.125 MG PO TBDP
0.2500 mg | ORAL_TABLET | Freq: Once | ORAL | Status: AC
  Administered 2023-03-06: 0.25 mg via ORAL
  Filled 2023-03-06: qty 2

## 2023-03-06 MED ORDER — POTASSIUM CHLORIDE CRYS ER 20 MEQ PO TBCR: 20.0000 meq | EXTENDED_RELEASE_TABLET | Freq: Every day | ORAL | 0 refills | Status: DC

## 2023-03-06 NOTE — ED Triage Notes (Signed)
Pt sts that she had a colonoscopy on 02/06/23. Since than pt sts that she has had many complications however this AM pt sts that she had a large dark black stool.

## 2023-03-06 NOTE — ED Notes (Signed)
Patient is resting on lobby chair. Patient is stable, walks easily to and from restroom. Patient has good color, no dyspnea noted.

## 2023-03-06 NOTE — Telephone Encounter (Signed)
  Chief Complaint: black stool today  Symptoms: solid BM black stool today . Hx diarrhea since colonoscopy 02/06/23. Weight loss worsening now weighs 124 lbs. C/o headache, nausea, no appetite. Tired all of the time. Low abdominal pan  Frequency: today  Pertinent Negatives: Patient denies blood in stool, no breathing issues reported no dizziness no bright red blood in toilet no vomiting.  Disposition: [x] ED /[] Urgent Care (no appt availability in office) / [] Appointment(In office/virtual)/ []  [facility]/ [] Home Care/ [] Refused Recommended Disposition /[] [facility]/ []  Follow-up with PCP Additional Notes:   Recommended to notify PCP if ED needed.  Patient does not want to go to [facility] due to wait times. Unable to schedule any appt today . Called patient back to recommend to go to ED now for evaluation . Patient reports she will go but not sure if she can wait greater than 2 hours in ED due to sx of tiredness.    Reason for Disposition  Black or tarry bowel movements  (Exception: Chronic-unchanged black-grey BMs AND is taking iron pills or Pepto-Bismol.)  Answer Assessment - Initial Assessment Questions 1. COLOR: "What color is it?" "Is that color in part or all of the stool?"     Black  2. ONSET: "When was the unusual color first noted?"     Today solid stool  3. CAUSE: "Have you eaten any food or taken any medicine of this color?" Note: See listing in Background Information section.      no 4. OTHER SYMPTOMS: "Do you have any other symptoms?" (e.g., abdomen pain, diarrhea, jaundice, fever).     Low abdominal pain , level 5 black stool x 1 solid BM. Weight loss continues. Nausea no appetite. headache  Answer Assessment - Initial Assessment Questions 1. APPEARANCE of BLOOD: "What color is it?" "Is it passed separately, on the surface of the stool, or mixed in with the stool?"      Solid stool black in color 2. AMOUNT: "How much blood was passed?"      Na  3. FREQUENCY:  "How many times has blood been passed with the stools?"      X 1  4. ONSET: "When was the blood first seen in the stools?" (Days or weeks)      Black stool today  5. DIARRHEA: "Is there also some diarrhea?" If Yes, ask: "How many diarrhea stools in the past 24 hours?"      No diarrhea today but has had diarrheal stools since colonoscopy 02/06/23 6. CONSTIPATION: "Do you have constipation?" If Yes, ask: "How bad is it?"     na 7. RECURRENT SYMPTOMS: "Have you had blood in your stools before?" If Yes, ask: "When was the last time?" and "What happened that time?"      na 8. BLOOD THINNERS: "Do you take any blood thinners?" (e.g., Coumadin/warfarin, Pradaxa/dabigatran, aspirin)     Na  9. OTHER SYMPTOMS: "Do you have any other symptoms?"  (e.g., abdomen pain, vomiting, dizziness, fever)     Low abdominal pain , nausea, headaches, no appetite and weight loss worsening. 10. PREGNANCY: "Is there any chance you are pregnant?" "When was your last menstrual period?"       Na  Protocols used: Stools - Unusual Color-A-AH, Rectal Bleeding-A-AH

## 2023-03-06 NOTE — Telephone Encounter (Signed)
Pt. Calling and said "a nurse just called me back." Disconnected the call stating "she's calling me back."

## 2023-03-06 NOTE — ED Provider Notes (Signed)
[facility] Provider Note    Event Date/Time   First MD Initiated Contact with Patient 03/06/23 1900     (approximate)   History   Melena   HPI  Emily Johnston is a 62 y.o. female  here with diarrhea. Pt reports that for the past >1 month she has had profuse watery diarrhea. It started after a colonoscopy and has persisted. She has been seen by Gi multiple times for this and had several negative CT scans. She has been losing weight, up to 12 lb. She has been taking bentyl w/o relief. She is stressed, losing weight, and having a lot of difficulty managing her sx. She has not been taking an antidiarrheal regularly. No known fevers, chills. No travel outside of the [place]. No blood in her diarrhea. No vomitnig.       Physical Exam   Triage Vital Signs: ED Triage Vitals  Enc Vitals Group     BP 03/06/23 1624 (!) 134/91     Pulse Rate 03/06/23 1624 82     Resp 03/06/23 1624 17     Temp 03/06/23 1624 98.7 F (37.1 C)     Temp Source 03/06/23 1624 Oral     SpO2 03/06/23 1623 97 %     Weight 03/06/23 1625 125 lb 3.2 oz (56.8 kg)     Height 03/06/23 1625 5\' 2"  (1.575 m)     Head Circumference --      Peak Flow --      Pain Score 03/06/23 1625 7     Pain Loc --      Pain Edu? --      Excl. in GC? --     Most recent vital signs: Vitals:   03/06/23 2000 03/06/23 2100  BP: (!) 164/101 (!) 152/105  Pulse: 62 66  Resp:    Temp:    SpO2: 96% 96%     General: Awake, no distress.  CV:  Good peripheral perfusion.  Resp:  Normal work of breathing.  Abd:  No distention. No tenderness, rebound or guarding. Normal bowel sounds. Other:  Mildly dry MM.   ED Results / Procedures / Treatments   Labs (all labs ordered are listed, but only abnormal results are displayed) Labs Reviewed  COMPREHENSIVE METABOLIC PANEL - Abnormal; Notable for the following components:      Result Value   Potassium 3.0 (*)    Glucose, Bld 108 (*)    Calcium 8.8 (*)     All other components within normal limits  URINALYSIS, ROUTINE W REFLEX MICROSCOPIC - Abnormal; Notable for the following components:   Color, Urine YELLOW (*)    APPearance CLEAR (*)    Hgb urine dipstick MODERATE (*)    All other components within normal limits  GASTROINTESTINAL PANEL BY PCR, STOOL (REPLACES STOOL CULTURE)  C DIFFICILE QUICK SCREEN W PCR REFLEX    CBC  T4, FREE  TSH  PHOSPHORUS  MAGNESIUM  POC OCCULT BLOOD, ED  TYPE AND SCREEN  TYPE AND SCREEN     EKG    RADIOLOGY Reviewed prior CTs - unremarkable   I also independently reviewed and agree with radiologist interpretations.   PROCEDURES:  Critical Care performed: No  MEDICATIONS ORDERED IN ED: Medications  sodium chloride 0.9 % bolus 1,000 mL (0 mLs Intravenous Stopped 03/06/23 2111)  diphenoxylate-atropine (LOMOTIL) 2.5-0.025 MG per tablet 2 tablet (2 tablets Oral Given 03/06/23 2109)  hyoscyamine (ANASPAZ) disintergrating tablet 0.25 mg (0.25 mg Oral Given  03/06/23 2121)  ketorolac (TORADOL) 30 MG/ML injection 15 mg (15 mg Intravenous Given 03/06/23 2109)     IMPRESSION / MDM / ASSESSMENT AND PLAN / ED COURSE  I reviewed the triage vital signs and the nursing notes.                              Differential diagnosis includes, but is not limited to: IBS, diverticulitis, colitis, enteritis, IBD, secretory diarrhea, celiacs, other malabsorptive diarrhea  Patient's presentation is most consistent with acute presentation with potential threat to life or bodily function.  The patient is on the cardiac monitor to evaluate for evidence of arrhythmia and/or significant heart rate changes  62 yo F here with profuse, watery diarrhea. This has been ongoing for weeks, and I extensively reviewed her PCP and GI notes. She's had multiple negative CT scans. Recent extensive w/u with PCP unremarkable. Mild TSH decrease noted there but TSH, free T4 is normal here. Other than mild hypoK/dehydration, labs here are  again reassuring. No significant leukocytosis. No signs of sepsis. Pt feels better with fludis and antidiarrheals. Her abdomen is soft, NT, ND.   Suspect ongoing IBS, but question of underlying secretory diarrhea or other malabsorptive state is certainly a consideration given the extent of her sx. Currently, with her reassuring labs and vitals I see no emergent indication for repeat imaging or admission. However, do think its reasonable to switch to hysocyamine for her sx, manage with lomotil as outpt and refer for additional GI work-up.    FINAL CLINICAL IMPRESSION(S) / ED DIAGNOSES   Final diagnoses:  Chronic diarrhea     Rx / DC Orders   ED Discharge Orders          Ordered    hyoscyamine (LEVBID) 0.375 MG 12 hr tablet  2 times daily        03/06/23 2217    diphenoxylate-atropine (LOMOTIL) 2.5-0.025 MG tablet  4 times daily PRN        03/06/23 2217    potassium chloride SA (KLOR-CON M) 20 MEQ tablet  Daily        03/06/23 2217             Note:  This document was prepared using Dragon voice recognition software and may include unintentional dictation errors.   Shaune Pollack, MD 03/06/23 2235

## 2023-03-09 ENCOUNTER — Ambulatory Visit: Payer: 59 | Admitting: Nurse Practitioner

## 2023-03-09 ENCOUNTER — Telehealth: Payer: Self-pay

## 2023-03-09 NOTE — Transitions of Care (Post Inpatient/ED Visit) (Unsigned)
   03/09/2023  Name: Emily Johnston MRN: [id number] DOB: [birth year]  Today's TOC FU Call Status: Today's TOC FU Call Status:: Unsuccessul Call (1st Attempt) Unsuccessful Call (1st Attempt) Date: 03/09/23  Attempted to reach the patient regarding the most recent Inpatient/ED visit.  Follow Up Plan: Additional outreach attempts will be made to reach the patient to complete the Transitions of Care (Post Inpatient/ED visit) call.   Signature ***

## 2023-03-10 NOTE — Transitions of Care (Post Inpatient/ED Visit) (Signed)
03/10/2023  Name: Emily Johnston MRN: [id number] DOB: 1961/02/16  Today's TOC FU Call Status: Today's TOC FU Call Status:: Successful TOC FU Call Competed Unsuccessful Call (1st Attempt) Date: 03/09/23 [facility] FU Call Complete Date: 03/10/23  Transition Care Management Follow-up Telephone Call Date of Discharge: 03/06/23 Discharge Facility: [facility]) Type of Discharge: Emergency Department How have you been since you were released from the hospital?: Same Any questions or concerns?: No  Items Reviewed: Medications obtained,verified, and reconciled?: Yes (Medications Reviewed) Dietary orders reviewed?: No Do you have support at home?: Yes People in Home: sibling(s)  Medications Reviewed Today: Medications Reviewed Today     Reviewed by Celso Amy, PA-C (Physician Assistant) on 02/26/23 at 1610  Med List Status: <None>    acitretin (SORIATANE) 25 MG capsule   Order: [id number] Taking?: Yes Sig: Take 1 capsule by mouth daily. Documenting Provider: [provider] Last Dose: Taking Status: Active Informant: Not Known        albuterol (VENTOLIN HFA) 108 (90 Base) MCG/ACT inhaler   Order: [id number] Taking?: Yes Sig: Inhale 2 puffs into the lungs every 6 (six) hours as needed for wheezing or shortness of breath. Documenting Provider: Dorcas Carrow, DO Last Dose: Taking Status: Active Informant: Not Known        Cholecalciferol 1.25 MG (50000 UT) TABS   Order: [id number] Taking?: Yes Sig: Take 1 tablet by mouth once a week. Documenting Provider: Marjie Skiff, NP Last Dose: Taking Status: Active Informant: Not Known        clobetasol ointment (TEMOVATE) 0.05 %   Order: [id number] Taking?: Yes Sig: Apply 1 application. topically 2 (two) times daily. Documenting Provider: [provider] Last Dose: Taking Status: Active Informant: Not Known        clonazePAM (KLONOPIN) 0.5 MG tablet   Order:  [id number] Taking?: Yes Sig: Take 1 tablet (0.5 mg total) by mouth 2 (two) times daily as needed for anxiety. Documenting Provider: Marjie Skiff, NP Last Dose: Taking Status: Active Informant: Not Known        dicyclomine (BENTYL) 20 MG tablet   Order: [id number] Taking?: Yes Sig: Take 1 tablet (20 mg total) by mouth 3 (three) times daily before meals. Documenting Provider: Celso Amy, PA-C Last Dose: None Status: Active Informant: Not Known        diphenoxylate-atropine (LOMOTIL) 2.5-0.025 MG tablet   Order: [id number] Taking?: Yes Sig: Take 1 tablet by mouth 2 (two) times daily. Documenting Provider: Celso Amy, PA-C Last Dose: None Status: Active Informant: Not Known        DULoxetine (CYMBALTA) 60 MG capsule   Order: [id number] Taking?: Yes Sig: Take 2 capsules (120 mg total) by mouth daily. Documenting Provider: Marjie Skiff, NP Last Dose: Taking Status: Active Informant: Not Known        fluticasone (FLONASE) 50 MCG/ACT nasal spray   Order: [id number] Taking?: Yes Sig: SPRAY 2 SPRAYS INTO EACH NOSTRIL EVERY DAY Documenting Provider: Marjie Skiff, NP Last Dose: Taking Status: Active Informant: Not Known        Fluticasone-Umeclidin-Vilant (TRELEGY ELLIPTA) 200-62.5-25 MCG/ACT AEPB   Order: [id number] Taking?: Yes Sig: Inhale 1 puff into the lungs daily. Documenting Provider: Salena Saner, MD Last Dose: Taking Status: Active Informant: Not Known        folic acid (FOLVITE) 1 MG tablet   Order: [id number] Taking?: Yes Sig: Take 1 mg by mouth daily. Documenting Provider: [provider] Last Dose: Taking Status: Active Informant:  Pharmacy Records        hydrocortisone 1 % ointment   Order: [id number] Taking?: Yes Sig: Apply 1 Application topically 2 (two) times daily. Documenting Provider: Minna Antis, MD Last Dose: Taking Status: Active Informant: Not Known        lactobacillus  acidophilus & bulgar (LACTINEX) chewable tablet   Order: [id number] Taking?: Yes Sig: Chew 1 tablet by mouth 3 (three) times daily with meals. Documenting Provider: Marjie Skiff, NP Last Dose: Taking Status: Active Informant: Not Known        leflunomide (ARAVA) 20 MG tablet   Order: [id number] Taking?: Yes Sig: Take 20 mg by mouth daily. Documenting Provider: [provider] Last Dose: Taking Status: Active Informant: Not Known        meclizine (ANTIVERT) 12.5 MG tablet   Order: [id number] Taking?: Yes Sig: Take 1 tablet (12.5 mg total) by mouth 3 (three) times daily as needed for dizziness. Documenting Provider: Dorcas Carrow, DO Last Dose: Taking Status: Active Informant: Not Known        montelukast (SINGULAIR) 10 MG tablet   Order: [id number] Taking?: Yes Sig: Take 1 tablet (10 mg total) by mouth at bedtime. Documenting Provider: Marjie Skiff, NP Last Dose: Taking Status: Active Informant: Not Known        Multiple Vitamin (MULTIVITAMIN) tablet   Order: [id number] Taking?: Yes Sig: Take 1 tablet by mouth daily. Documenting Provider: [provider] Last Dose: Taking Status: Active Informant: Not Known        omeprazole (PRILOSEC) 40 MG capsule   Order: [id number] Taking?: Yes Sig: Take 1 capsule (40 mg total) by mouth in the morning and at bedtime. Documenting Provider: Wyline Mood, MD Last Dose: Taking Status: Active Informant: Not Known        ondansetron (ZOFRAN) 4 MG tablet   Order: [id number] Taking?: Yes Sig: Take 1 tablet (4 mg total) by mouth daily as needed for nausea or vomiting. Documenting Provider: Pilar Jarvis, MD Last Dose: Taking Status: Active Informant: Not Known        OTEZLA 30 MG TABS   Order: [id number] Taking?: Yes Sig: Take 1 tablet by mouth 2 (two) times daily. Documenting Provider: [provider] Last Dose: Taking Status: Active Informant: Not Known         oxyCODONE-acetaminophen (PERCOCET) 5-325 MG tablet   Order: [id number] Taking?: Yes Sig: Take 1 tablet by mouth every 4 (four) hours as needed for severe pain. Documenting Provider: Chesley Noon, MD Last Dose: Taking Status: Active Informant: Not Known        pantoprazole (PROTONIX) 40 MG tablet   Order: [id number] Taking?: Yes Sig: Take 1 tablet (40 mg total) by mouth daily. Documenting Provider: Chesley Noon, MD Last Dose: Taking Status: Active Informant: Not Known        rosuvastatin (CRESTOR) 10 MG tablet   Order: [id number] Taking?: Yes Sig: Take 1 tablet (10 mg total) by mouth daily. Documenting Provider: Marjie Skiff, NP Last Dose: Taking Status: Active Informant: Not Known        sucralfate (CARAFATE) 1 g tablet   Order: [id number] Taking?: Yes Sig: Take 1 tablet (1 g total) by mouth 4 (four) times daily. Documenting Provider: Wyline Mood, MD Last Dose: Taking Status: Active Informant: Not Known        tacrolimus (PROTOPIC) 0.1 % ointment   Order: [id number] Taking?: Yes Sig: 2 (two) times daily. Documenting Provider: [provider] Last Dose: Taking Status: Active Informant: Not Known  Home Care and Equipment/Supplies: Were Home Health Services Ordered?: No Any new equipment or medical supplies ordered?: No  Functional Questionnaire: Do you need assistance with bathing/showering or dressing?: No Do you need assistance with meal preparation?: No Do you need assistance with eating?: No Do you have difficulty maintaining continence: No Do you need assistance with getting out of bed/getting out of a chair/moving?: No Do you have difficulty managing or taking your medications?: No  Follow up appointments reviewed: PCP Follow-up appointment confirmed?: Yes Date of PCP follow-up appointment?: 03/12/23 Follow-up Provider: Aura Dials [facility] Follow-up appointment confirmed?: No Do you  need transportation to your follow-up appointment?: No Do you understand care options if your condition(s) worsen?: Yes-patient verbalized understanding    SIGNATURE Leward Quan, [facility]

## 2023-03-11 ENCOUNTER — Inpatient Hospital Stay: Payer: 59 | Attending: Oncology | Admitting: Licensed Clinical Social Worker

## 2023-03-11 ENCOUNTER — Encounter: Payer: Self-pay | Admitting: Licensed Clinical Social Worker

## 2023-03-11 ENCOUNTER — Inpatient Hospital Stay: Payer: 59

## 2023-03-11 DIAGNOSIS — Z803 Family history of malignant neoplasm of breast: Secondary | ICD-10-CM

## 2023-03-11 DIAGNOSIS — Z86010 Personal history of colonic polyps: Secondary | ICD-10-CM

## 2023-03-11 NOTE — Patient Instructions (Signed)
Vomiting, Adult Vomiting is when stomach contents forcefully come out of the mouth. Many people notice nausea before vomiting. Vomiting can make you feel weak and cause you to become dehydrated. Dehydration can make you feel tired and thirsty, cause you to have a dry mouth, and decrease how often you urinate. Older adults and people who have other diseases or a weak body defense system (immune system) are at higher risk for dehydration. It is important to treat vomiting as told by your health care provider. Follow these instructions at home:  Watch your symptoms for any changes. Tell your health care provider about them. Eating and drinking     Follow these recommendations as told by your health care provider: Take an oral rehydration solution (ORS). This is a drink that is sold at pharmacies and retail stores. Eat bland, easy-to-digest foods in small amounts as you are able. These foods include bananas, applesauce, rice, lean meats, toast, and crackers. Drink clear fluids slowly and in small amounts as you are able. Clear fluids include water, ice chips, low-calorie sports drinks, and fruit juice that has water added (diluted fruit juice). Avoid drinking fluids that contain a lot of sugar or caffeine, such as energy drinks, sports drinks, and soda. Avoid alcohol. Avoid spicy or fatty foods.  General instructions Wash your hands often using soap and water for at least 20 seconds. If soap and water are not available, use hand sanitizer. Make sure that everyone in your household washes their hands frequently. Take over-the-counter and prescription medicines only as told by your health care provider. Rest at home while you recover. Watch your condition for any changes. Keep all follow-up visits. This is important. Contact a health care provider if: Your vomiting gets worse. You have new symptoms. You have a fever. You cannot drink fluids without vomiting. You feel light-headed or  dizzy. You have a headache. You have muscle cramps. You have a rash. You have pain while urinating. Get help right away if: You have pain in your chest, neck, arm, or jaw. Your heart is beating very quickly. You have trouble breathing or you are breathing very quickly. You feel extremely weak or you faint. Your skin feels cold and clammy. You feel confused. You have persistent vomiting. You have vomit that is bright red or looks like black coffee grounds. You have stools (feces) that are bloody or black, or stools that look like tar. You have a severe headache, a stiff neck, or both. You have severe pain, cramping, or bloating in your abdomen. You have signs of dehydration, such as: Dark urine, very little urine, or no urine. Cracked lips. Dry mouth. Sunken eyes. Sleepiness. Weakness. These symptoms may be an emergency. Get help right away. Call 911. Do not wait to see if the symptoms will go away. Do not drive yourself to the hospital. Summary Vomiting is when stomach contents forcefully come out of the mouth. Vomiting can cause you to become dehydrated. It is important to treat vomiting as told by your health care provider. Follow your health care provider's instructions about eating and drinking. Wash your hands often using soap and water for at least 20 seconds. If soap and water are not available, use hand sanitizer. Watch your condition for any changes and for signs of dehydration. Keep all follow-up visits. This is important. This information is not intended to replace advice given to you by your health care provider. Make sure you discuss any questions you have with your health care provider.   Document Revised: 03/08/2021 Document Reviewed: 03/08/2021 Elsevier Patient Education  2024 Elsevier Inc.  

## 2023-03-11 NOTE — Progress Notes (Signed)
REFERRING PROVIDER: Wyline Mood, MD [address] [place],  [place] [address]  PRIMARY PROVIDER:  Marjie Skiff, NP  PRIMARY REASON FOR VISIT:  1. Personal history of colonic polyps   2. Family history of breast cancer      HISTORY OF PRESENT ILLNESS:   Ms. Geron, a 62 y.o. female, was seen for a [place] cancer genetics consultation at the request of Dr. Tobi Bastos due to a personal history of colon polyps.  Ms. Shinall presents to clinic today to discuss the possibility of a hereditary predisposition to cancer, genetic testing, and to further clarify her future cancer risks, as well as potential cancer risks for family members.   CANCER HISTORY:   Ms. Horkey is a 62 y.o. female with no personal history of cancer.    RISK FACTORS:  Menarche was at age 45.  First live birth at age 50.  Ovaries intact: yes.  Hysterectomy: yes.  Menopausal status: postmenopausal.  HRT use: 0 years. Colonoscopy: yes;  2024 - 13 polyps, 12 tubular adenomas. Mammogram within the last year: yes. Number of breast biopsies: 0.  Past Medical History:  Diagnosis Date   Anxiety    Arthritis    COPD (chronic obstructive pulmonary disease) (HCC)    Degenerative disc disease, cervical    Depression    Fibromyalgia    GERD (gastroesophageal reflux disease)    HLD (hyperlipidemia)    IBS (irritable bowel syndrome)    Psoriasis    PTSD (post-traumatic stress disorder)     Past Surgical History:  Procedure Laterality Date   ABDOMINAL HYSTERECTOMY     CARPAL TUNNEL RELEASE     COLONOSCOPY WITH PROPOFOL N/A 02/06/2023   Procedure: COLONOSCOPY WITH PROPOFOL;  Surgeon: Wyline Mood, MD;  Location: [facility] ENDOSCOPY;  Service: Endoscopy;  Laterality: N/A;   ESOPHAGOGASTRODUODENOSCOPY (EGD) WITH PROPOFOL N/A 02/06/2023   Procedure: ESOPHAGOGASTRODUODENOSCOPY (EGD) WITH PROPOFOL;  Surgeon: Wyline Mood, MD;  Location: [facility] ENDOSCOPY;  Service: Endoscopy;  Laterality: N/A;   KNEE SURGERY      NECK SURGERY      FAMILY HISTORY:  We obtained a detailed, 4-generation family history.  Significant diagnoses are listed below: Family History  Problem Relation Age of Onset   Breast cancer Mother 33   Ms. Mraz has 1 living daughter, 1 son who passed. She has 2 brothers. None have had cancer. One brother had colonoscopy and a few polyps were found.  Ms. Fulginiti's mother passed in her 70s, she was diagnosed with breast cancer in her 28s. Maternal grandmother and grandfather both died of cancer, unknown types/ages.  Ms. Kram's father died at 44. No known cancers on his side of the family.  Ms. Mollica is unaware of previous family history of genetic testing for hereditary cancer risks. There is no reported Ashkenazi Jewish ancestry. There is no known consanguinity.    GENETIC COUNSELING ASSESSMENT: Ms. Swiney is a 62 y.o. female with a personal history of >10 colon polyps which is somewhat suggestive of a hereditary polyposis syndrome and predisposition to cancer. We, therefore, discussed and recommended the following at today's visit.   DISCUSSION: We discussed that polyps in general are common, however, most people have fewer than 5 lifetime polyps.  When an individual has 10 or more polyps we become concerned about an underlying polyposis syndrome. The most common hereditary polyposis syndromes are caused by problems in the APC and MUTYH genes. We discussed that testing is beneficial for several reasons including knowing how to  follow individuals for cancer screenings, and understand if other family members could be at risk for cancer and allow them to undergo genetic testing.   We reviewed the characteristics, features and inheritance patterns of hereditary cancer syndromes. We also discussed genetic testing, including the appropriate family members to test, the process of testing, insurance coverage and turn-around-time for results. We discussed the implications of a negative,  positive and/or variant of uncertain significant result. We recommended [name] pursue genetic testing for the [facility] Multi-Cancer+RNA gene panel.   Based on Ms. Broom's personal history of >10 colon polyps,  she meets medical criteria for genetic testing. Despite that she meets criteria, she may still have an out of pocket cost. We discussed that if her out of pocket cost for testing is over $100, the laboratory will call and confirm whether she wants to proceed with testing.  If the out of pocket cost of testing is less than $100 she will be billed by the genetic testing laboratory.   PLAN: After considering the risks, benefits, and limitations, Ms. Wanninger did not wish to pursue genetic testing at today's visit. We understand this decision and remain available to coordinate genetic testing at any time in the future. We, therefore, recommend Ms. Mancusi continue to follow the cancer screening guidelines given by her primary healthcare provider.  Ms. Shinn questions were answered to her satisfaction today. Our contact information was provided should additional questions or concerns arise. Thank you for the referral and allowing [place] to share in the care of your patient.   Lacy Duverney, MS, [facility] Genetic Counselor [email or web address] Phone: [phone]  The patient was seen for a total of 30 minutes in face-to-face genetic counseling.  Dr. Orlie Dakin was available for discussion regarding this case.   _______________________________________________________________________ For Office Staff:  Number of people involved in session: 2 Was an Intern/ student involved with case: yes; [employer] intern Haskell Flirt assisted with this case.

## 2023-03-12 ENCOUNTER — Encounter: Payer: Self-pay | Admitting: Nurse Practitioner

## 2023-03-12 ENCOUNTER — Ambulatory Visit (INDEPENDENT_AMBULATORY_CARE_PROVIDER_SITE_OTHER): Payer: 59 | Admitting: Nurse Practitioner

## 2023-03-12 VITALS — BP 109/74 | HR 90 | Temp 98.2°F | Wt 122.2 lb

## 2023-03-12 DIAGNOSIS — K582 Mixed irritable bowel syndrome: Secondary | ICD-10-CM

## 2023-03-12 DIAGNOSIS — R630 Anorexia: Secondary | ICD-10-CM

## 2023-03-12 DIAGNOSIS — R7989 Other specified abnormal findings of blood chemistry: Secondary | ICD-10-CM

## 2023-03-12 DIAGNOSIS — L4050 Arthropathic psoriasis, unspecified: Secondary | ICD-10-CM

## 2023-03-12 DIAGNOSIS — R634 Abnormal weight loss: Secondary | ICD-10-CM | POA: Diagnosis not present

## 2023-03-12 DIAGNOSIS — E0590 Thyrotoxicosis, unspecified without thyrotoxic crisis or storm: Secondary | ICD-10-CM | POA: Insufficient documentation

## 2023-03-12 LAB — MICROSCOPIC EXAMINATION
Bacteria, UA: NONE SEEN
WBC, UA: NONE SEEN /hpf (ref 0–5)

## 2023-03-12 LAB — URINALYSIS, ROUTINE W REFLEX MICROSCOPIC
Bilirubin, UA: NEGATIVE
Glucose, UA: NEGATIVE
Ketones, UA: NEGATIVE
Leukocytes,UA: NEGATIVE
Nitrite, UA: NEGATIVE
Protein,UA: NEGATIVE
Specific Gravity, UA: <1.005 — ABNORMAL LOW (ref 1.005–1.030)
Urobilinogen, Ur: 0.2 mg/dL (ref 0.2–1.0)
pH, UA: 5 (ref 5.0–7.5)

## 2023-03-12 NOTE — Assessment & Plan Note (Signed)
Ongoing, suspect related to her underlying anxiety and PTSD.  Continue Bentyl to take before meals and in evening as has offered benefit to diarrhea and abdominal pain.  Recommend heavy focus on diet changes with more fiber and water intake.  Recent labs stable.

## 2023-03-12 NOTE — Progress Notes (Signed)
BP 109/74   Pulse 90   Temp 98.2 F (36.8 C) (Oral)   Wt 122 lb 3.2 oz (55.4 kg)   SpO2 96%   BMI 22.35 kg/m    Subjective:    Patient ID: Emily Johnston, female    DOB: 1961-04-27, 62 y.o.   MRN: [id number]  HPI: Emily Johnston is a 62 y.o. female  Chief Complaint  Patient presents with   Weight Loss   Abdominal Pain   ABDOMINAL PAIN  Follow-up today for weight loss and abdominal pain.  Present since colonoscopy -- had procedure May 24th.  Reports fatigue, lack of energy & weight loss.  Reports her bowels have improved somewhat since starting Dicyclomine.  Has been followed by GI since this time with reassuring full work-up including labs, last visit was 02/26/23.  Weight was 136 lbs in April at last visit with PCP, now is 122 lbs.  Reports her temperature has been up for her at home -- 98-99 range.  No recent tick bites and recent labs all reassuring with exception of thyroid which was mildly low, recheck in ER was improved.  Last ER visit was 03/06/23.  Having less diarrhea at this time.  Has psoriatic arthritis underlying, last 02/03/23.  Duration:weeks Onset: gradual Episode duration: since 02/06/23 Frequency: improving, intermittent Alleviating factors: laying down Aggravating factors: nothing Status: improving Treatments attempted: Imodium - Bentyl (started by GI), probiotic, Omeprazole Fever: no Nausea: yes Vomiting: no -- takes Zofran as needed Weight loss: yes Decreased appetite: yes Diarrhea:  improved Constipation: no Blood in stool: no Heartburn: yes Jaundice: no Rash: per baseline with her psoriasis Dysuria/urinary frequency: yes Hematuria: no History of sexually transmitted disease: no Recurrent NSAID use: no   Relevant past medical, surgical, family and social history reviewed and updated as indicated. Interim medical history since our last visit reviewed. Allergies and medications reviewed and updated.  Review of Systems   Constitutional:  Positive for activity change, appetite change and fatigue. Negative for chills and fever.  HENT: Negative.    Respiratory:  Negative for cough, chest tightness, shortness of breath and wheezing.   Cardiovascular:  Negative for chest pain, palpitations and leg swelling.  Gastrointestinal:  Positive for nausea. Negative for abdominal distention, abdominal pain, blood in stool, constipation, diarrhea, rectal pain and vomiting.  Endocrine: Negative for cold intolerance and heat intolerance.  Musculoskeletal:  Positive for myalgias.  Skin:  Positive for rash (per baseline).  Neurological:  Positive for weakness. Negative for headaches.  Psychiatric/Behavioral:  Negative for decreased concentration, sleep disturbance and suicidal ideas. The patient is nervous/anxious.     Per HPI unless specifically indicated above     Objective:    BP 109/74   Pulse 90   Temp 98.2 F (36.8 C) (Oral)   Wt 122 lb 3.2 oz (55.4 kg)   SpO2 96%   BMI 22.35 kg/m   Wt Readings from Last 3 Encounters:  03/12/23 122 lb 3.2 oz (55.4 kg)  03/06/23 125 lb 3.2 oz (56.8 kg)  02/26/23 125 lb 3.2 oz (56.8 kg)    Physical Exam Vitals and nursing note reviewed. Exam conducted with a chaperone present.  Constitutional:      General: She is awake. She is not in acute distress.    Appearance: She is well-developed and well-groomed. She is not ill-appearing or toxic-appearing.  HENT:     Head: Normocephalic and atraumatic.     Right Ear: Hearing, tympanic membrane, ear canal and external ear normal.  No drainage.     Left Ear: Hearing, tympanic membrane, ear canal and external ear normal. No drainage.     Nose: Nose normal.     Right Sinus: No maxillary sinus tenderness or frontal sinus tenderness.     Left Sinus: No maxillary sinus tenderness or frontal sinus tenderness.     Mouth/Throat:     Mouth: Mucous membranes are moist.     Pharynx: Oropharynx is clear. Uvula midline. No pharyngeal swelling,  oropharyngeal exudate or posterior oropharyngeal erythema.  Eyes:     General: Lids are normal.        Right eye: No discharge.        Left eye: No discharge.     Extraocular Movements: Extraocular movements intact.     Conjunctiva/sclera: Conjunctivae normal.     Pupils: Pupils are equal, round, and reactive to light.     Visual Fields: Right eye visual fields normal and left eye visual fields normal.  Neck:     Thyroid: Thyromegaly (mild) present.     Vascular: No carotid bruit.     Trachea: Trachea normal.  Cardiovascular:     Rate and Rhythm: Normal rate and regular rhythm.     Heart sounds: Normal heart sounds. No murmur heard.    No gallop.  Pulmonary:     Effort: Pulmonary effort is normal. No accessory muscle usage or respiratory distress.     Breath sounds: Normal breath sounds.  Chest:  Breasts:    Right: Normal.     Left: Normal.  Abdominal:     General: Bowel sounds are normal. There is no distension.     Palpations: Abdomen is soft. There is no hepatomegaly or splenomegaly.     Tenderness: There is no abdominal tenderness.  Musculoskeletal:        General: Normal range of motion.     Cervical back: Normal range of motion and neck supple.     Right lower leg: No edema.     Left lower leg: No edema.  Lymphadenopathy:     Head:     Right side of head: No submental, submandibular, tonsillar, preauricular or posterior auricular adenopathy.     Left side of head: No submental, submandibular, tonsillar, preauricular or posterior auricular adenopathy.     Cervical: No cervical adenopathy.     Upper Body:     Right upper body: No supraclavicular, axillary or pectoral adenopathy.     Left upper body: No supraclavicular, axillary or pectoral adenopathy.  Skin:    General: Skin is warm and dry.     Capillary Refill: Capillary refill takes less than 2 seconds.     Findings: No rash.  Neurological:     Mental Status: She is alert and oriented to person, place, and time.      Gait: Gait is intact.     Deep Tendon Reflexes: Reflexes are normal and symmetric.     Reflex Scores:      Brachioradialis reflexes are 2+ on the right side and 2+ on the left side.      Patellar reflexes are 2+ on the right side and 2+ on the left side. Psychiatric:        Attention and Perception: Attention normal.        Mood and Affect: Mood normal.        Speech: Speech normal.        Behavior: Behavior normal. Behavior is cooperative.        Thought Content:  Thought content normal.        Judgment: Judgment normal.     Results for orders placed or performed during the hospital encounter of 03/06/23  Comprehensive metabolic panel  Result Value Ref Range   Sodium 135 135 - 145 mmol/L   Potassium 3.0 (L) 3.5 - 5.1 mmol/L   Chloride 102 98 - 111 mmol/L   CO2 25 22 - 32 mmol/L   Glucose, Bld 108 (H) 70 - 99 mg/dL   BUN 10 8 - 23 mg/dL   Creatinine, Ser 0.98 0.44 - 1.00 mg/dL   Calcium 8.8 (L) 8.9 - 10.3 mg/dL   Total Protein 6.6 6.5 - 8.1 g/dL   Albumin 3.7 3.5 - 5.0 g/dL   AST 19 15 - 41 U/L   ALT 12 0 - 44 U/L   Alkaline Phosphatase 91 38 - 126 U/L   Total Bilirubin 0.6 0.3 - 1.2 mg/dL   GFR, Estimated >11 >91 mL/min   Anion gap 8 5 - 15  CBC  Result Value Ref Range   WBC 5.2 4.0 - 10.5 K/uL   RBC 4.20 3.87 - 5.11 MIL/uL   Hemoglobin 13.1 12.0 - 15.0 g/dL   HCT 47.8 29.5 - 62.1 %   MCV 94.0 80.0 - 100.0 fL   MCH 31.2 26.0 - 34.0 pg   MCHC 33.2 30.0 - 36.0 g/dL   RDW 30.8 65.7 - 84.6 %   Platelets 174 150 - 400 K/uL   nRBC 0.0 0.0 - 0.2 %  Urinalysis, Routine w reflex microscopic -Urine, Clean Catch  Result Value Ref Range   Color, Urine YELLOW (A) YELLOW   APPearance CLEAR (A) CLEAR   Specific Gravity, Urine 1.011 1.005 - 1.030   pH 5.0 5.0 - 8.0   Glucose, UA NEGATIVE NEGATIVE mg/dL   Hgb urine dipstick MODERATE (A) NEGATIVE   Bilirubin Urine NEGATIVE NEGATIVE   Ketones, ur NEGATIVE NEGATIVE mg/dL   Protein, ur NEGATIVE NEGATIVE mg/dL   Nitrite  NEGATIVE NEGATIVE   Leukocytes,Ua NEGATIVE NEGATIVE   RBC / HPF 11-20 0 - 5 RBC/hpf   WBC, UA 0-5 0 - 5 WBC/hpf   Bacteria, UA NONE SEEN NONE SEEN   Squamous Epithelial / HPF 0-5 0 - 5 /HPF   Mucus PRESENT   T4, free  Result Value Ref Range   Free T4 0.94 0.61 - 1.12 ng/dL  TSH  Result Value Ref Range   TSH 1.001 0.350 - 4.500 uIU/mL  Phosphorus  Result Value Ref Range   Phosphorus 3.8 2.5 - 4.6 mg/dL  Magnesium  Result Value Ref Range   Magnesium 2.0 1.7 - 2.4 mg/dL      Assessment & Plan:   Problem List Items Addressed This Visit       Digestive   IBS (irritable bowel syndrome)    Ongoing, suspect related to her underlying anxiety and PTSD.  Continue Bentyl to take before meals and in evening as has offered benefit to diarrhea and abdominal pain.  Recommend heavy focus on diet changes with more fiber and water intake.  Recent labs stable.          Musculoskeletal and Integument   Psoriatic arthritis (HCC)   Relevant Orders   Vitamin B12     Other   Decreased appetite    Ongoing since colonoscopy on 02/06/23, has had a work-up with GI that is reassuring and had CT on 02/06/23 and recent XR that were reassuring.  She is higher risk, smoker and  family history of cancer.  All recent labs and imaging reassuring with exception of thyroid, will recheck thyroid labs today with antibody and obtain imaging of thyroid as mild thyromegaly noted on exam.  Check B12, BMP, UA.  Overall exam today reassuring with no red flags.  If overall this returns with reassuring findings may further delve into mood, as has underlying anxiety at baseline.  Recommend she speak with psychiatrist and rheumatology for further recommendations.      Relevant Orders   Basic Metabolic Panel (BMET)   Low serum thyroid stimulating hormone (TSH)    Recheck labs today with antibody and order for ultrasound as mild thyromegaly noted on exam.      Relevant Orders   T4, free   Thyroid peroxidase antibody    TSH   US THYROID   Unintended weight loss - Primary    Ongoing since colonoscopy on 02/06/23, has had a work-up with GI that is reassuring and had CT on 02/06/23 and recent XR that were reassuring.  She is higher risk, smoker and family history of cancer.  All recent labs and imaging reassuring with exception of thyroid, will recheck thyroid labs today with antibody and obtain imaging of thyroid as mild thyromegaly noted on exam.  Check B12, BMP, UA.  Overall exam today reassuring with no red flags.  If overall this returns with reassuring findings may further delve into mood, as has underlying anxiety at baseline.  Recommend she speak with psychiatrist and rheumatology for further recommendations.      Relevant Orders   Basic Metabolic Panel (BMET)   Vitamin B12   Urinalysis, Routine w reflex microscopic     Follow up plan: Return in about 4 weeks (around 04/09/2023) for WEIGHT LOSS AND FATIGUE.

## 2023-03-12 NOTE — Assessment & Plan Note (Signed)
Ongoing since colonoscopy on 02/06/23, has had a work-up with GI that is reassuring and had CT on 02/06/23 and recent XR that were reassuring.  She is higher risk, smoker and family history of cancer.  All recent labs and imaging reassuring with exception of thyroid, will recheck thyroid labs today with antibody and obtain imaging of thyroid as mild thyromegaly noted on exam.  Check B12, BMP, UA.  Overall exam today reassuring with no red flags.  If overall this returns with reassuring findings may further delve into mood, as has underlying anxiety at baseline.  Recommend she speak with psychiatrist and rheumatology for further recommendations. 

## 2023-03-12 NOTE — Assessment & Plan Note (Signed)
Recheck labs today with antibody and order for ultrasound as mild thyromegaly noted on exam.

## 2023-03-12 NOTE — Assessment & Plan Note (Signed)
Ongoing since colonoscopy on 02/06/23, has had a work-up with GI that is reassuring and had CT on 02/06/23 and recent XR that were reassuring.  She is higher risk, smoker and family history of cancer.  All recent labs and imaging reassuring with exception of thyroid, will recheck thyroid labs today with antibody and obtain imaging of thyroid as mild thyromegaly noted on exam.  Check B12, BMP, UA.  Overall exam today reassuring with no red flags.  If overall this returns with reassuring findings may further delve into mood, as has underlying anxiety at baseline.  Recommend she speak with psychiatrist and rheumatology for further recommendations.

## 2023-03-13 LAB — BASIC METABOLIC PANEL
BUN/Creatinine Ratio: 10 — ABNORMAL LOW (ref 12–28)
BUN: 8 mg/dL (ref 8–27)
CO2: 24 mmol/L (ref 20–29)
Calcium: 9.7 mg/dL (ref 8.7–10.3)
Chloride: 104 mmol/L (ref 96–106)
Creatinine, Ser: 0.81 mg/dL (ref 0.57–1.00)
Glucose: 52 mg/dL — ABNORMAL LOW (ref 70–99)
Potassium: 4 mmol/L (ref 3.5–5.2)
Sodium: 142 mmol/L (ref 134–144)
eGFR: 83 mL/min/{1.73_m2} (ref >59)

## 2023-03-13 LAB — T4, FREE: Free T4: 1.32 ng/dL (ref 0.82–1.77)

## 2023-03-13 LAB — THYROID PEROXIDASE ANTIBODY: Thyroperoxidase Ab SerPl-aCnc: 16 IU/mL (ref 0–34)

## 2023-03-13 LAB — VITAMIN B12: Vitamin B-12: 387 pg/mL (ref 232–1245)

## 2023-03-13 LAB — TSH: TSH: 0.226 u[IU]/mL — ABNORMAL LOW (ref 0.450–4.500)

## 2023-03-13 NOTE — Progress Notes (Signed)
Contacted via MyChart   Good afternoon Emily Johnston, your labs have returned: - Kidney function, creatinine and eGFR, remains normal. - Glucose (sugar) a little low, ensure you are getting healthy meals in three times a day. - Thyroid (TSH) is on lower side again, but your Free T4 and antibody are normal.  We will see what ultrasound shows is and go from there.  We will recheck next visit. - B12 level on lower side of normal.  I recommend you start taking Vitamin B12 1000 MCG daily which may help your energy levels and fatigue.  Any questions? Keep being awesome!!  Thank you for allowing me to participate in your care.  I appreciate you. Kindest regards, Odin Mariani

## 2023-03-17 ENCOUNTER — Ambulatory Visit: Payer: Self-pay | Admitting: *Deleted

## 2023-03-17 NOTE — Telephone Encounter (Signed)
Summary: sinus infection?   Pt is calling to see if her PCP can call her something in for a Sinus Infection. Pt states that her ears feel like swimmers ears and her head feel full on snot. Please advise.   [id number] [place], [place] - [address] Phone: [phone] Fax: [phone]         Reason for Disposition  Earache  Answer Assessment - Initial Assessment Questions 1. LOCATION: "Where does it hurt?"      Ear pressure, head pressure- full of "snot" 2. ONSET: "When did the sinus pain start?"  (e.g., hours, days)      Symptoms started this am 3. SEVERITY: "How bad is the pain?"   (Scale 1-10; mild, moderate or severe)   - MILD (1-3): doesn't interfere with normal activities    - MODERATE (4-7): interferes with normal activities (e.g., work or school) or awakens from sleep   - SEVERE (8-10): excruciating pain and patient unable to do any normal activities        Moderate- feels like laying down 4. RECURRENT SYMPTOM: "Have you ever had sinus problems before?" If Yes, ask: "When was the last time?" and "What happened that time?"      Yes- this year 5. NASAL CONGESTION: "Is the nose blocked?" If Yes, ask: "Can you open it or must you breathe through your mouth?"     Patient is sneezing- and now stopped up- pressure at eyes 6. NASAL DISCHARGE: "Do you have discharge from your nose?" If so ask, "What color?"     No color- clear 7. FEVER: "Do you have a fever?" If Yes, ask: "What is it, how was it measured, and when did it start?"      Feels like "low grade  fever" 8. OTHER SYMPTOMS: "Do you have any other symptoms?" (e.g., sore throat, cough, earache, difficulty breathing)     Scratchy throat, cough, ear pressure  Protocols used: Sinus Pain or Congestion-A-AH

## 2023-03-17 NOTE — Telephone Encounter (Signed)
LVM for patient to return call to set up an apt

## 2023-03-17 NOTE — Telephone Encounter (Signed)
  Chief Complaint: sinus symptoms- pressure in ears and face Symptoms: nasal drainage, sneezing, ear pressure, eye pressure, possible low grade temperature Frequency: symptoms got worse this morning  Disposition: [] ED /[] Urgent Care (no appt availability in office) / [] Appointment(In office/virtual)/ []  [facility]/ [] Home Care/ [x] Refused Recommended Disposition /[] [facility]/ []  Follow-up with PCP Additional Notes: Patient is calling to request medication- advised patient of policy- but patient states she was just in office 03/12/23 and feels confident PCP will send medication for her. Patient advised I would send message.

## 2023-03-18 ENCOUNTER — Other Ambulatory Visit: Payer: Self-pay | Admitting: Nurse Practitioner

## 2023-03-18 ENCOUNTER — Encounter: Payer: Self-pay | Admitting: Nurse Practitioner

## 2023-03-18 ENCOUNTER — Telehealth (INDEPENDENT_AMBULATORY_CARE_PROVIDER_SITE_OTHER): Payer: 59 | Admitting: Nurse Practitioner

## 2023-03-18 DIAGNOSIS — J3489 Other specified disorders of nose and nasal sinuses: Secondary | ICD-10-CM

## 2023-03-18 MED ORDER — PREDNISONE 20 MG PO TABS: 40.0000 mg | ORAL_TABLET | Freq: Every day | ORAL | 0 refills | Status: AC

## 2023-03-18 MED ORDER — FLUTICASONE PROPIONATE 50 MCG/ACT NA SUSP
NASAL | 2 refills | Status: AC
Start: 2023-03-18 — End: ?

## 2023-03-18 MED ORDER — MECLIZINE HCL 12.5 MG PO TABS: 12.5000 mg | ORAL_TABLET | Freq: Three times a day (TID) | ORAL | 1 refills | Status: AC | PRN

## 2023-03-18 NOTE — Progress Notes (Signed)
There were no vitals taken for this visit.   Subjective:    Patient ID: Emily Johnston, female    DOB: 05-Sep-1961, 62 y.o.   MRN: [id number]  HPI: Emily Johnston is a 62 y.o. female  Chief Complaint  Patient presents with   Sinus Problem    Patient thinks she has a sinus infection as she is having drainage, irritated scratchy throat, ear feel full of fluids with pain and her head feel completely congested. Patient has tried over the counter medications such as generic allergy medicine and drinking vitamin water and nothing seems to be helping. Patient says she became symptomatic on Tuesday.    Medication Refill    Patient is requesting a refill on her Meclizine and Flonase prescriptions at today's visit.   This visit was completed via video visit through MyChart due to the restrictions of the COVID-19 pandemic. All issues as above were discussed and addressed. Physical exam was done as above through visual confirmation on video through MyChart. If it was felt that the patient should be evaluated in the office, they were directed there. The patient verbally consented to this visit. Location of the patient: home Location of the provider: work Those involved with this call:  Provider: Aura Dials, DNP CMA: Tristan Schroeder, CMA Front Desk/Registration: Kandice Hams  Time spent on call:  21 minutes with patient face to face via video conference. More than 50% of this time was spent in counseling and coordination of care. 15 minutes total spent in review of patient's record and preparation of their chart.  I verified patient identity using two factors (patient name and date of birth). Patient consents verbally to being seen via telemedicine visit today.    UPPER RESPIRATORY TRACT INFECTION Reports she has had ear pain and drainage + congestion, throat scratchy and irritated.  Symptoms started Tuesday morning, 24 hours.  Is a smoker, has underlying COPD. Fever: she is feeling  warm, no temp taken Cough: yes Shortness of breath: yes, mild Wheezing: no Chest pain: no Chest tightness: yes Chest congestion: yes Nasal congestion: yes Runny nose: yes Post nasal drip: yes Sneezing: no Sore throat: yes Swollen glands: no Sinus pressure: yes Headache: yes Face pain: yes Toothache: no Ear pain: yes bilateral Ear pressure: yes bilateral Eyes red/itching:no Eye drainage/crusting: no  Vomiting: no Rash: no Fatigue: yes Sick contacts: no Strep contacts: no  Context: stable Recurrent sinusitis: no Relief with OTC cold/cough medications: no  Treatments attempted: Flonase    Relevant past medical, surgical, family and social history reviewed and updated as indicated. Interim medical history since our last visit reviewed. Allergies and medications reviewed and updated.  Review of Systems  Constitutional:  Positive for chills and fatigue. Negative for activity change, appetite change and fever.  HENT:  Positive for congestion, ear pain, postnasal drip, rhinorrhea, sinus pressure and sore throat. Negative for ear discharge, facial swelling, sinus pain, sneezing and voice change.   Eyes:  Negative for pain and visual disturbance.  Respiratory:  Positive for cough and chest tightness. Negative for shortness of breath and wheezing.   Cardiovascular:  Negative for chest pain, palpitations and leg swelling.  Gastrointestinal: Negative.   Neurological:  Positive for headaches. Negative for dizziness and numbness.  Psychiatric/Behavioral: Negative.      Per HPI unless specifically indicated above     Objective:    There were no vitals taken for this visit.  Wt Readings from Last 3 Encounters:  03/12/23 122 lb 3.2  oz (55.4 kg)  03/06/23 125 lb 3.2 oz (56.8 kg)  02/26/23 125 lb 3.2 oz (56.8 kg)    Physical Exam Vitals and nursing note reviewed.  Constitutional:      General: She is awake. She is not in acute distress.    Appearance: She is well-developed and  well-groomed. She is not ill-appearing or toxic-appearing.  HENT:     Head: Normocephalic.     Right Ear: Hearing normal.     Left Ear: Hearing normal.  Eyes:     General: Lids are normal.        Right eye: No discharge.        Left eye: No discharge.     Conjunctiva/sclera: Conjunctivae normal.  Pulmonary:     Effort: Pulmonary effort is normal. No accessory muscle usage or respiratory distress.  Musculoskeletal:     Cervical back: Normal range of motion.  Neurological:     Mental Status: She is alert and oriented to person, place, and time.  Psychiatric:        Attention and Perception: Attention normal.        Mood and Affect: Mood normal.        Behavior: Behavior normal. Behavior is cooperative.        Thought Content: Thought content normal.        Judgment: Judgment normal.    Results for orders placed or performed in visit on 03/12/23  Microscopic Examination   Urine  Result Value Ref Range   WBC, UA None seen 0 - 5 /hpf   RBC, Urine 0-2 0 - 2 /hpf   Epithelial Cells (non renal) 0-10 0 - 10 /hpf   Bacteria, UA None seen None seen/Few  Basic Metabolic Panel (BMET)  Result Value Ref Range   Glucose 52 (L) 70 - 99 mg/dL   BUN 8 8 - 27 mg/dL   Creatinine, Ser 1.61 0.57 - 1.00 mg/dL   eGFR 83 >09 UE/AVW/0.98   BUN/Creatinine Ratio 10 (L) 12 - 28   Sodium 142 134 - 144 mmol/L   Potassium 4.0 3.5 - 5.2 mmol/L   Chloride 104 96 - 106 mmol/L   CO2 24 20 - 29 mmol/L   Calcium 9.7 8.7 - 10.3 mg/dL  T4, free  Result Value Ref Range   Free T4 1.32 0.82 - 1.77 ng/dL  Thyroid peroxidase antibody  Result Value Ref Range   Thyroperoxidase Ab SerPl-aCnc 16 0 - 34 IU/mL  TSH  Result Value Ref Range   TSH 0.226 (L) 0.450 - 4.500 uIU/mL  Vitamin B12  Result Value Ref Range   Vitamin B-12 387 232 - 1,245 pg/mL  Urinalysis, Routine w reflex microscopic  Result Value Ref Range   Specific Gravity, UA <1.005 (L) 1.005 - 1.030   pH, UA 5.0 5.0 - 7.5   Color, UA Yellow Yellow    Appearance Ur Clear Clear   Leukocytes,UA Negative Negative   Protein,UA Negative Negative/Trace   Glucose, UA Negative Negative   Ketones, UA Negative Negative   RBC, UA Trace (A) Negative   Bilirubin, UA Negative Negative   Urobilinogen, Ur 0.2 0.2 - 1.0 mg/dL   Nitrite, UA Negative Negative   Microscopic Examination See below:       Assessment & Plan:   Problem List Items Addressed This Visit       Other   Sinus pressure - Primary    Acute for 24 hours.  Recommend Covid testing at home.  At this  time suspect more viral infection, educated her on this and that abx therapy is not recommended until around day 7 of symptoms.  At this time will send in Prednisone 40 MG daily for 5 days to help with inflammation.  Continue Flonase and OTC regimen.  Recommend: - Increased rest - Increasing Fluids - Acetaminophen / ibuprofen as needed for fever/pain.  - Mucinex.  - Saline sinus flushes or a neti pot.  - Humidifying the air.  - If not feeling better by Monday alert provider and abx will be sent in at that time.  She is traveling on the 15th.   Return for worsening or ongoing symptoms.      Relevant Medications   fluticasone (FLONASE) 50 MCG/ACT nasal spray    I discussed the assessment and treatment plan with the patient. The patient was provided an opportunity to ask questions and all were answered. The patient agreed with the plan and demonstrated an understanding of the instructions.   The patient was advised to call back or seek an in-person evaluation if the symptoms worsen or if the condition fails to improve as anticipated.   I provided 21+ minutes of time during this encounter.    Follow up plan: Return if symptoms worsen or fail to improve.

## 2023-03-18 NOTE — Patient Instructions (Signed)

## 2023-03-18 NOTE — Telephone Encounter (Unsigned)
Copied from CRM [id number]. Topic: General - Other >> Mar 18, 2023  4:19 PM Everette C wrote: Reason for CRM: Medication Refill - Medication: fluticasone (FLONASE) 50 MCG/ACT nasal spray [[id number]]  Has the patient contacted their pharmacy? Yes.   (Agent: If no, request that the patient contact the pharmacy for the refill. If patient does not wish to contact the pharmacy document the reason why and proceed with request.) (Agent: If yes, when and what did the pharmacy advise?)  Preferred Pharmacy (with phone number or street name): [phone] [place], [place] - [address] [place] [address]QMVHQ: [phone] Fax: [phone]: Not open 24 hours   Has the patient been seen for an appointment in the last year OR does the patient have an upcoming appointment? Yes.    Agent: Please be advised that RX refills may take up to 3 business days. We ask that you follow-up with your pharmacy.

## 2023-03-18 NOTE — Assessment & Plan Note (Signed)
Acute for 24 hours.  Recommend Covid testing at home.  At this time suspect more viral infection, educated her on this and that abx therapy is not recommended until around day 7 of symptoms.  At this time will send in Prednisone 40 MG daily for 5 days to help with inflammation.  Continue Flonase and OTC regimen.  Recommend: - Increased rest - Increasing Fluids - Acetaminophen / ibuprofen as needed for fever/pain.  - Mucinex.  - Saline sinus flushes or a neti pot.  - Humidifying the air.  - If not feeling better by Monday alert provider and abx will be sent in at that time.  She is traveling on the 15th.   Return for worsening or ongoing symptoms.

## 2023-03-20 NOTE — Telephone Encounter (Signed)
Refused Flonase spray because this is a duplicate request.

## 2023-03-23 ENCOUNTER — Encounter: Payer: Self-pay | Admitting: Nurse Practitioner

## 2023-03-23 ENCOUNTER — Telehealth: Payer: Self-pay | Admitting: Nurse Practitioner

## 2023-03-23 MED ORDER — DOXYCYCLINE HYCLATE 100 MG PO TABS: 100.0000 mg | ORAL_TABLET | Freq: Two times a day (BID) | ORAL | 0 refills | Status: AC

## 2023-03-23 NOTE — Telephone Encounter (Signed)
Pt is calling in because she was seen virtually with [name] last week and per pt, [name] told her if she wasn't feeling any better to call back and she would send in some medication for a sinus infection. Pt says she is not feeling better and would like that prescription sent in to [facility] in [place]

## 2023-03-31 ENCOUNTER — Ambulatory Visit: Payer: 59 | Admitting: Physician Assistant

## 2023-04-06 ENCOUNTER — Ambulatory Visit: Payer: 59 | Admitting: Pulmonary Disease

## 2023-04-10 ENCOUNTER — Ambulatory Visit: Payer: 59 | Admitting: Nurse Practitioner

## 2023-04-14 ENCOUNTER — Other Ambulatory Visit: Payer: Self-pay | Admitting: Nurse Practitioner

## 2023-04-14 ENCOUNTER — Telehealth: Payer: Self-pay | Admitting: Nurse Practitioner

## 2023-04-14 ENCOUNTER — Ambulatory Visit
Admission: RE | Admit: 2023-04-14 | Discharge: 2023-04-14 | Disposition: A | Discharge status: Home / Self Care | Payer: 59 | Source: Ambulatory Visit | Attending: Nurse Practitioner | Admitting: Nurse Practitioner

## 2023-04-14 DIAGNOSIS — E042 Nontoxic multinodular goiter: Secondary | ICD-10-CM

## 2023-04-14 DIAGNOSIS — R7989 Other specified abnormal findings of blood chemistry: Secondary | ICD-10-CM | POA: Insufficient documentation

## 2023-04-14 NOTE — Telephone Encounter (Unsigned)
Copied from CRM [id number]. Topic: Referral - Request for Referral >> Apr 14, 2023  2:25 PM Marlow Baars wrote: Has patient seen PCP for this complaint? Yes.   Referral for which specialty: Gastroenterology Preferred provider/office: [facility]  Reason for referral: Passing blood and complications from her colonoscopy including loss of appetite and vomiting after eating  Please send referral information to fax number [phone] and assist patient further

## 2023-04-14 NOTE — Progress Notes (Signed)
Contacted via MyChart   Good evening Emily Johnston, your labs have returned and thyroid is noted to be slightly enlarged as I discussed when I checked it on palpitation.  There are nodules noted, but none are concerning for cancer and none need biopsy.  I would recommend a referral to endocrinology due to your thyroid ultrasound and recent couple low TSH levels + your symptoms.  I will place this referral as symptoms you are having could be related to our thyroid and it may be more overactive.  Any questions? Keep being amazing!!  Thank you for allowing me to participate in your care.  I appreciate you. Kindest regards, Dreden Rivere

## 2023-04-15 ENCOUNTER — Encounter: Payer: Self-pay | Admitting: Nurse Practitioner

## 2023-04-15 DIAGNOSIS — K582 Mixed irritable bowel syndrome: Secondary | ICD-10-CM

## 2023-04-15 DIAGNOSIS — R634 Abnormal weight loss: Secondary | ICD-10-CM

## 2023-04-15 NOTE — Telephone Encounter (Signed)
See my chart message

## 2023-04-16 MED ORDER — ONDANSETRON HCL 4 MG PO TABS: 4.0000 mg | ORAL_TABLET | Freq: Three times a day (TID) | ORAL | 12 refills | Status: DC | PRN

## 2023-04-16 NOTE — Addendum Note (Signed)
Addended by: Aura Dials T on: 04/16/2023 08:25 AM   Modules accepted: Orders

## 2023-04-17 ENCOUNTER — Emergency Department
Admission: EM | Admit: 2023-04-17 | Discharge: 2023-04-17 | Disposition: A | Discharge status: Home / Self Care | Payer: 59 | Attending: Student in an Organized Health Care Education/Training Program | Admitting: Student in an Organized Health Care Education/Training Program

## 2023-04-17 ENCOUNTER — Other Ambulatory Visit: Payer: Self-pay

## 2023-04-17 ENCOUNTER — Ambulatory Visit: Payer: Self-pay

## 2023-04-17 DIAGNOSIS — R110 Nausea: Secondary | ICD-10-CM | POA: Diagnosis not present

## 2023-04-17 DIAGNOSIS — R002 Palpitations: Secondary | ICD-10-CM | POA: Insufficient documentation

## 2023-04-17 DIAGNOSIS — R634 Abnormal weight loss: Secondary | ICD-10-CM | POA: Insufficient documentation

## 2023-04-17 DIAGNOSIS — R638 Other symptoms and signs concerning food and fluid intake: Secondary | ICD-10-CM | POA: Diagnosis not present

## 2023-04-17 DIAGNOSIS — R5383 Other fatigue: Secondary | ICD-10-CM | POA: Diagnosis not present

## 2023-04-17 LAB — COMPREHENSIVE METABOLIC PANEL
ALT: 14 U/L (ref 0–44)
AST: 20 U/L (ref 15–41)
Albumin: 3.8 g/dL (ref 3.5–5.0)
Alkaline Phosphatase: 97 U/L (ref 38–126)
Anion gap: 13 (ref 5–15)
BUN: 8 mg/dL (ref 8–23)
CO2: 19 mmol/L — ABNORMAL LOW (ref 22–32)
Calcium: 9.2 mg/dL (ref 8.9–10.3)
Chloride: 105 mmol/L (ref 98–111)
Creatinine, Ser: 0.91 mg/dL (ref 0.44–1.00)
GFR, Estimated: >60 mL/min (ref >60)
Glucose, Bld: 106 mg/dL — ABNORMAL HIGH (ref 70–99)
Potassium: 3.4 mmol/L — ABNORMAL LOW (ref 3.5–5.1)
Sodium: 137 mmol/L (ref 135–145)
Total Bilirubin: 0.5 mg/dL (ref 0.3–1.2)
Total Protein: 7.1 g/dL (ref 6.5–8.1)

## 2023-04-17 LAB — CBC
HCT: 41.9 % (ref 36.0–46.0)
Hemoglobin: 14.6 g/dL (ref 12.0–15.0)
MCH: 31.4 pg (ref 26.0–34.0)
MCHC: 34.8 g/dL (ref 30.0–36.0)
MCV: 90.1 fL (ref 80.0–100.0)
Platelets: 228 10*3/uL (ref 150–400)
RBC: 4.65 MIL/uL (ref 3.87–5.11)
RDW: 13.4 % (ref 11.5–15.5)
WBC: 6.7 10*3/uL (ref 4.0–10.5)
nRBC: 0 % (ref 0.0–0.2)

## 2023-04-17 LAB — LIPASE, BLOOD: Lipase: 38 U/L (ref 11–51)

## 2023-04-17 LAB — TSH: TSH: 0.665 u[IU]/mL (ref 0.350–4.500)

## 2023-04-17 MED ORDER — SODIUM CHLORIDE 0.9 % IV BOLUS
1000.0000 mL | Freq: Once | INTRAVENOUS | Status: AC
  Administered 2023-04-17: 1000 mL via INTRAVENOUS

## 2023-04-17 MED ORDER — DICYCLOMINE HCL 10 MG PO CAPS: 10.0000 mg | ORAL_CAPSULE | Freq: Three times a day (TID) | ORAL | 1 refills | Status: DC | PRN

## 2023-04-17 MED ORDER — LORAZEPAM 1 MG PO TABS
1.0000 mg | ORAL_TABLET | Freq: Once | ORAL | Status: AC
  Administered 2023-04-17: 1 mg via ORAL
  Filled 2023-04-17: qty 1

## 2023-04-17 MED ORDER — DICYCLOMINE HCL 10 MG PO CAPS
10.0000 mg | ORAL_CAPSULE | Freq: Once | ORAL | Status: AC
  Administered 2023-04-17: 10 mg via ORAL
  Filled 2023-04-17: qty 1

## 2023-04-17 NOTE — Telephone Encounter (Signed)
Message from Mazie T sent at 04/17/2023  3:17 PM EDT  Summary: medication request   Patient called stated she can not get to the Endo until Thursday and is requesting to have something for anxiety. [facility] [address] Complaint: anxiety Symptoms: diarrhea, 20 lb weight loss since June, racing hear beat, panicky at times. In bed most of the time.  Frequency: chronic since  Pertinent Negatives: Patient denies suicidal ideation Disposition: [] ED /[x] Urgent Care (no appt availability in office) / [] Appointment(In office/virtual)/ []  [facility]/ [] Home Care/ [] Refused Recommended Disposition /[] [place] [facility]/ []  Follow-up with PCP Additional Notes: advised to go to [facility] address and phone pt refused. Advised return to ED- pt refused. NT called office and spoke to [facility] and explained pt assessment and pt refusals.  Was advised no openings since end of day.  Assertively advised pt to go to [facility]. Advised that she is in crisis and that is where she can go locally and get the treatment she needs.  Pt stated she will go. Address and phone number provided. Advised pt to call her best friend or have her brother come home and take her to [facility]. Pt stated she will do so.    This pt needs a hospital f/u appt. Next opening  05/05/23. Please assist. Pt wants it to be virtual.  Reason for Disposition  MODERATE anxiety (e.g., persistent or frequent anxiety symptoms; interferes with sleep, school, or work)  Answer Assessment - Initial Assessment Questions 1. CONCERN: "Did anything happen that prompted you to call today?"      Seen in ED  2. ANXIETY SYMPTOMS: "Can you describe how you (your loved one; patient) have been feeling?" (e.g., tense, restless, panicky, anxious, keyed up, overwhelmed, sense of impending doom).      Tense, anxious,? Panic attack  3. ONSET: "How long have you been feeling this way?" (e.g., hours, days, weeks)     Gotten worse  May 24  4. SEVERITY: "How would you rate the level of anxiety?" (e.g., 0 - 10; or mild, moderate, severe).     sever 5. FUNCTIONAL IMPAIRMENT: "How have these feelings affected your ability to do daily activities?" "Have you had more difficulty than usual doing your normal daily activities?" (e.g., getting better, same, worse; self-care, school, work, interactions)     Bed bound - morning is worse time 6. HISTORY: "Have you felt this way before?" "Have you ever been diagnosed with an anxiety problem in the past?" (e.g., generalized anxiety disorder, panic attacks, PTSD). If Yes, ask: "How was this problem treated?" (e.g., medicines, counseling, etc.)     Lost son 36 years ago PTSD  7. RISK OF HARM - SUICIDAL IDEATION: "Do you ever have thoughts of hurting or killing yourself?" If Yes, ask:  "Do you have these feelings now?" "Do you have a plan on how you would do this?"     No suicidal treatment  8. TREATMENT:  "What has been done so far to treat this anxiety?" (e.g., medicines, relaxation strategies). "What has helped?"     Got anti anxiety med at ED- had rx of Klonopin  9. TREATMENT - THERAPIST: "Do you have a counselor or therapist? Name?"     yes 10. POTENTIAL TRIGGERS: "Do you drink caffeinated beverages (e.g., coffee, colas, teas), and how much daily?" "Do you drink alcohol or use any drugs?" "Have you started any new medicines recently?"  coffee 11. PATIENT SUPPORT: "Who is with you now?" "Who do you live with?" "Do you have family or friends who you can talk to?"        Brother best friend  39. OTHER SYMPTOMS: "Do you have any other symptoms?" (e.g., feeling depressed, trouble concentrating, trouble sleeping, trouble breathing, palpitations or fast heartbeat, chest pain, sweating, nausea, or diarrhea)       Diarrhea, weight loss, no appetite, racing heart rate, voice is shaking, Increased breathing and racing heart, racing thoughts, weak 20 lbs in June  Protocols used: Anxiety and  Panic Attack-A-AH

## 2023-04-17 NOTE — ED Provider Notes (Signed)
[facility] Provider Note    Event Date/Time   First MD Initiated Contact with Patient 04/17/23 1322     (approximate)   History   Weakness, Diarrhea, and Emesis   HPI  BETHANEY OSHANA is a 62 y.o. female presents the ER for evaluation of several months of chronic nausea poor p.o. intake palpitations and 20 pound weight loss.  Patient feels very weak and fatigued.  Does appear very anxious.  Denies any fevers.  States that the symptoms all stem from when she had a colonoscopy several months ago and has never been the same since.     Physical Exam   Triage Vital Signs: ED Triage Vitals  Encounter Vitals Group     BP 04/17/23 1251 (!) 134/99     Systolic BP Percentile --      Diastolic BP Percentile --      Pulse Rate 04/17/23 1251 99     Resp 04/17/23 1251 20     Temp 04/17/23 1251 98.6 F (37 C)     Temp Source 04/17/23 1251 Oral     SpO2 04/17/23 1251 95 %     Weight 04/17/23 1253 120 lb (54.4 kg)     Height 04/17/23 1253 5\' 4"  (1.626 m)     Head Circumference --      Peak Flow --      Pain Score 04/17/23 1300 8     Pain Loc --      Pain Education --      Exclude from Growth Chart --     Most recent vital signs: Vitals:   04/17/23 1251  BP: (!) 134/99  Pulse: 99  Resp: 20  Temp: 98.6 F (37 C)  SpO2: 95%     Constitutional: Aler, anxious appearing Eyes: Conjunctivae are normal.  Head: Atraumatic. Nose: No congestion/rhinnorhea. Mouth/Throat: Mucous membranes are moist.   Neck: Painless ROM.  Cardiovascular:   Good peripheral circulation. Respiratory: Normal respiratory effort.  No retractions.  Gastrointestinal: Soft and nontender.  Musculoskeletal:  no deformity Neurologic:  MAE spontaneously. No gross focal neurologic deficits are appreciated.  Skin:  Skin is warm, dry and intact. No rash noted. Psychiatric: Mood and affect are normal. Speech and behavior are normal.    ED Results / Procedures / Treatments    Labs (all labs ordered are listed, but only abnormal results are displayed) Labs Reviewed  COMPREHENSIVE METABOLIC PANEL - Abnormal; Notable for the following components:      Result Value   Potassium 3.4 (*)    CO2 19 (*)    Glucose, Bld 106 (*)    All other components within normal limits  LIPASE, BLOOD  CBC  TSH  URINALYSIS, ROUTINE W REFLEX MICROSCOPIC     EKG  ED ECG REPORT I, Willy Eddy, the attending physician, personally viewed and interpreted this ECG.   Date: 04/17/2023  EKG Time: 12:57  Rate: 100  Rhythm: sinus  Axis: normal  Intervals: normal  ST&T Change: no stemi, no depressions, nonspecific st abn    RADIOLOGY    PROCEDURES:  Critical Care performed: No  Procedures   MEDICATIONS ORDERED IN ED: Medications  sodium chloride 0.9 % bolus 1,000 mL (1,000 mLs Intravenous New Bag/Given 04/17/23 1344)  LORazepam (ATIVAN) tablet 1 mg (1 mg Oral Given 04/17/23 1344)  dicyclomine (BENTYL) capsule 10 mg (10 mg Oral Given 04/17/23 1435)     IMPRESSION / MDM / ASSESSMENT AND PLAN / ED COURSE  I  reviewed the triage vital signs and the nursing notes.                              Differential diagnosis includes, but is not limited to, dehydration, IBS, diverticulitis, colitis, electrolyte abnormality, anxiety, depression, anorexia,  Patient presenting to the ER for evaluation of symptoms as described above.  Based on symptoms, risk factors and considered above differential, this presenting complaint could reflect a potentially life-threatening illness therefore the patient will be placed on continuous pulse oximetry and telemetry for monitoring.  Laboratory evaluation will be sent to evaluate for the above complaints.      Clinical Course as of 04/17/23 1442  Fri Apr 17, 2023  1427 Patient reassessed.  We discussed findings of her blood work and option for further diagnostic testing including ct imaging.  Given reassuring workup I have a lower  suspicion for emergent cause of patient's symptoms and patient declining CT and itching having said that she has had several CTs for similar symptoms all of which have been unremarkable.  States that she has seen a psychiatrist but that they will not give her anything for her "nerves.  "She does appear more calm and relaxed after p.o. Ativan.  Is receiving IV fluids.  She is tolerating p.o.  States that she has had significant weight loss but does not have any significant electrolyte or metabolic abnormalities.  Discussed concern organic and nonorganic etiologies of her symptoms but that ultimately given her duration of symptoms with reassuring workup here in the ER would be appropriate for outpatient follow-up. [PR]    Clinical Course User Index [PR] Willy Eddy, MD     FINAL CLINICAL IMPRESSION(S) / ED DIAGNOSES   Final diagnoses:  Weight loss     Rx / DC Orders   ED Discharge Orders     None        Note:  This document was prepared using Dragon voice recognition software and may include unintentional dictation errors.    Willy Eddy, MD 04/17/23 [phone]

## 2023-04-17 NOTE — Discharge Instructions (Signed)

## 2023-04-17 NOTE — ED Triage Notes (Addendum)
Pt states that she has been sick since may, reports that she has lost 20 lbs, pt states that she is very weak and is trembling, states that she is having diarrhea, and vomiting  Pt is concerned that it may be her thyroid

## 2023-04-20 ENCOUNTER — Telehealth: Payer: Self-pay

## 2023-04-20 NOTE — Telephone Encounter (Signed)
Called and LVM asking for patient to please return my call.  

## 2023-04-20 NOTE — Telephone Encounter (Signed)
Patient returned call. Follow up appointment scheduled for Wednesday.

## 2023-04-20 NOTE — Transitions of Care (Post Inpatient/ED Visit) (Signed)
04/20/2023  Name: Emily Johnston MRN: [id number] DOB: Aug 17, 1961  Today's TOC FU Call Status: Today's TOC FU Call Status:: Unsuccessful Call (1st Attempt) Unsuccessful Call (1st Attempt) Date: 04/20/23 [facility] FU Call Complete Date: 04/20/23  Transition Care Management Follow-up Telephone Call Date of Discharge: 04/17/23 Discharge Facility: [facility]) Type of Discharge: Emergency Department Reason for ED Visit: Other: How have you been since you were released from the hospital?: Worse Any questions or concerns?: No  Items Reviewed: Did you receive and understand the discharge instructions provided?: Yes Medications obtained,verified, and reconciled?: Yes (Medications Reviewed) Any new allergies since your discharge?: No Dietary orders reviewed?: No Do you have support at home?: Yes People in Home: sibling(s)  Medications Reviewed Today: Medications Reviewed Today     Reviewed by Pablo Ledger, CMA (Certified Medical Assistant) on 04/20/23 at 1311  Med List Status: <None>   Medication Order Taking? Sig Documenting Provider Last Dose Status Informant  albuterol (VENTOLIN HFA) 108 (90 Base) MCG/ACT inhaler [id number] Yes Inhale 2 puffs into the lungs every 6 (six) hours as needed for wheezing or shortness of breath. Olevia Perches P, DO Taking Active   Cholecalciferol 1.25 MG (50000 UT) TABS [id number] Yes Take 1 tablet by mouth once a week. Aura Dials T, NP Taking Active   clobetasol ointment (TEMOVATE) 0.05 % [id number] Yes Apply 1 application. topically 2 (two) times daily. [provider] Taking Active   clonazePAM (KLONOPIN) 0.5 MG tablet [id number] Yes Take 1 tablet (0.5 mg total) by mouth 2 (two) times daily as needed for anxiety. Aura Dials T, NP Taking Active   dicyclomine (BENTYL) 10 MG capsule [id number] Yes Take 1 capsule (10 mg total) by mouth 3 (three) times daily as needed for spasms. Willy Eddy, MD Taking  Active   dicyclomine (BENTYL) 20 MG tablet [id number] Yes Take 1 tablet (20 mg total) by mouth 3 (three) times daily before meals. Celso Amy, PA-C Taking Active   doxepin (SINEQUAN) 25 MG capsule [id number] Yes Take 25 mg by mouth at bedtime. [provider] Taking Active   DULoxetine (CYMBALTA) 60 MG capsule [id number] Yes Take 2 capsules (120 mg total) by mouth daily. Aura Dials T, NP Taking Active   fluticasone (FLONASE) 50 MCG/ACT nasal spray [id number] Yes SPRAY 2 SPRAYS INTO EACH NOSTRIL EVERY DAY Cannady, Dorie Rank, NP Taking Active   Fluticasone-Umeclidin-Vilant (TRELEGY ELLIPTA) 200-62.5-25 MCG/ACT AEPB [id number] Yes Inhale 1 puff into the lungs daily. Salena Saner, MD Taking Active   folic acid (FOLVITE) 1 MG tablet [id number] Yes Take 1 mg by mouth daily. [provider] Taking Active Pharmacy Records  hydrocortisone 1 % ointment [id number] Yes Apply 1 Application topically 2 (two) times daily. Minna Antis, MD Taking Active   hyoscyamine (LEVBID) 0.375 MG 12 hr tablet [id number]  Take 1 tablet (0.375 mg total) by mouth 2 (two) times daily. Shaune Pollack, MD  Expired 04/05/23 2359   leflunomide (ARAVA) 20 MG tablet [id number] Yes Take 20 mg by mouth daily. [provider] Taking Active   meclizine (ANTIVERT) 12.5 MG tablet [id number] Yes Take 1 tablet (12.5 mg total) by mouth 3 (three) times daily as needed for dizziness. Aura Dials T, NP Taking Active   montelukast (SINGULAIR) 10 MG tablet [id number] Yes Take 1 tablet (10 mg total) by mouth at bedtime. Aura Dials T, NP Taking Active   Multiple Vitamin (MULTIVITAMIN) tablet [id number] Yes Take 1 tablet by mouth daily. [provider] Taking Active   omeprazole (  PRILOSEC) 40 MG capsule [id number] Yes Take 1 capsule (40 mg total) by mouth in the morning and at bedtime. Wyline Mood, MD Taking Active   ondansetron [facility]) 4 MG tablet [id number] Yes Take 1 tablet (4 mg total) by mouth every  8 (eight) hours as needed for nausea or vomiting. Aura Dials T, NP Taking Active   OTEZLA 30 MG TABS [id number] Yes Take 1 tablet by mouth 2 (two) times daily. [provider] Taking Active   pantoprazole (PROTONIX) 40 MG tablet [id number]  Take 1 tablet (40 mg total) by mouth daily. Chesley Noon, MD  Expired 04/06/23 2359   rosuvastatin (CRESTOR) 10 MG tablet [id number] Yes Take 1 tablet (10 mg total) by mouth daily. Aura Dials T, NP Taking Active   sucralfate (CARAFATE) 1 g tablet [id number] Yes Take 1 tablet (1 g total) by mouth 4 (four) times daily. Wyline Mood, MD Taking Active   tacrolimus (PROTOPIC) 0.1 % ointment [id number] Yes 2 (two) times daily. [provider] Taking Active             Home Care and Equipment/Supplies: Were Home Health Services Ordered?: No Any new equipment or medical supplies ordered?: No  Functional Questionnaire: Do you need assistance with bathing/showering or dressing?: No Do you need assistance with meal preparation?: No Do you need assistance with eating?: No Do you have difficulty maintaining continence: No Do you need assistance with getting out of bed/getting out of a chair/moving?: No Do you have difficulty managing or taking your medications?: No  Follow up appointments reviewed: PCP Follow-up appointment confirmed?: Yes Date of PCP follow-up appointment?: 04/22/23 Follow-up Provider: Olevia Perches, DO Specialist Hospital Follow-up appointment confirmed?: Yes Date of Specialist follow-up appointment?: 04/23/23 Follow-Up Specialty Provider:: [facility]- Gastroenterology Do you need transportation to your follow-up appointment?: No Do you understand care options if your condition(s) worsen?: Yes-patient verbalized understanding    SIGNATURE: Wilhemena Durie, CMA

## 2023-04-20 NOTE — Transitions of Care (Post Inpatient/ED Visit) (Signed)
   04/20/2023  Name: Emily Johnston MRN: [id number] DOB: January 10, 1961  Today's TOC FU Call Status: Today's TOC FU Call Status:: Unsuccessful Call (1st Attempt) Unsuccessful Call (1st Attempt) Date: 04/20/23  Attempted to reach the patient regarding the most recent Inpatient/ED visit.  Follow Up Plan: Additional outreach attempts will be made to reach the patient to complete the Transitions of Care (Post Inpatient/ED visit) call.   Signature: Wilhemena Durie, CMA

## 2023-04-22 ENCOUNTER — Ambulatory Visit: Payer: 59 | Admitting: Family Medicine

## 2023-05-04 ENCOUNTER — Ambulatory Visit (INDEPENDENT_AMBULATORY_CARE_PROVIDER_SITE_OTHER): Payer: 59 | Admitting: Nurse Practitioner

## 2023-05-04 ENCOUNTER — Encounter: Payer: Self-pay | Admitting: Nurse Practitioner

## 2023-05-04 VITALS — BP 116/86 | HR 91 | Temp 98.3°F | Ht 62.0 in | Wt 119.6 lb

## 2023-05-04 DIAGNOSIS — J0111 Acute recurrent frontal sinusitis: Secondary | ICD-10-CM | POA: Diagnosis not present

## 2023-05-04 MED ORDER — PREDNISONE 20 MG PO TABS: 40.0000 mg | ORAL_TABLET | Freq: Every day | ORAL | 0 refills | Status: AC

## 2023-05-04 MED ORDER — AMOXICILLIN-POT CLAVULANATE 875-125 MG PO TABS: 1.0000 | ORAL_TABLET | Freq: Two times a day (BID) | ORAL | 0 refills | Status: AC

## 2023-05-04 NOTE — Patient Instructions (Signed)

## 2023-05-04 NOTE — Assessment & Plan Note (Signed)
Acute and present for one week.  Will not Covid or flu test today, is past treatment dates.  She gets frequent sinusitis. Would benefit return to ENT in future.  At this time start Augmentin and Prednisone treatment.  Recommend complete cessation of smoking.   - Increased rest - Increasing Fluids - Acetaminophen / ibuprofen as needed for fever/pain.  - Salt water gargling, chloraseptic spray and throat lozenges - Mucinex.  - Humidifying the air.

## 2023-05-04 NOTE — Progress Notes (Signed)
BP 116/86   Pulse 91   Temp 98.3 F (36.8 C) (Oral)   Ht 5\' 2"  (1.575 m)   Wt 119 lb 9.6 oz (54.3 kg)   SpO2 97%   BMI 21.88 kg/m    Subjective:    Patient ID: Emily Johnston, female    DOB: Feb 03, 1961, 62 y.o.   MRN: [id number]  HPI: Emily Johnston is a 62 y.o. female  Chief Complaint  Patient presents with   Sinusitis    Pt states she has been having a lot of sneezing, sinus drainage, left ear pain, and congestion since last Tuesday.    UPPER RESPIRATORY TRACT INFECTION Started with symptoms one week ago.  Went once to see ENT, but has not returned due to other health issues at present.    Her last infection was treated on 03/18/23 with Doxycycline and Prednisone.  Has frequent sinus infections. Continue to smoker daily.  Is using Singulair and Flonase as ordered. Fever: no Cough: yes Shortness of breath: no Wheezing: yes Chest pain: no Chest tightness: no Chest congestion: no Nasal congestion: yes Runny nose: yes Post nasal drip: yes Sneezing: no Sore throat: yes Swollen glands: no Sinus pressure: yes Headache: yes Face pain: yes Toothache: no Ear pain: yes left Ear pressure: yes left Eyes red/itching:no Eye drainage/crusting: no  Vomiting: no Rash: no Fatigue: yes Sick contacts: no Strep contacts: no  Context: fluctuating Recurrent sinusitis: no Relief with OTC cold/cough medications: no  Treatments attempted: cold/sinus, mucinex, and cough syrup    Relevant past medical, surgical, family and social history reviewed and updated as indicated. Interim medical history since our last visit reviewed. Allergies and medications reviewed and updated.  Review of Systems  Constitutional:  Positive for fatigue. Negative for activity change, appetite change and fever.  HENT:  Positive for congestion, postnasal drip, rhinorrhea, sinus pressure and sore throat. Negative for ear discharge, ear pain, facial swelling, sinus pain, sneezing and voice  change.   Eyes:  Negative for pain and visual disturbance.  Respiratory:  Positive for cough, chest tightness and wheezing. Negative for shortness of breath.   Cardiovascular:  Negative for chest pain, palpitations and leg swelling.  Musculoskeletal:  Negative for myalgias.  Neurological:  Positive for headaches. Negative for dizziness and numbness.  Psychiatric/Behavioral: Negative.      Per HPI unless specifically indicated above     Objective:    BP 116/86   Pulse 91   Temp 98.3 F (36.8 C) (Oral)   Ht 5\' 2"  (1.575 m)   Wt 119 lb 9.6 oz (54.3 kg)   SpO2 97%   BMI 21.88 kg/m   Wt Readings from Last 3 Encounters:  05/04/23 119 lb 9.6 oz (54.3 kg)  04/17/23 120 lb (54.4 kg)  03/12/23 122 lb 3.2 oz (55.4 kg)    Physical Exam Vitals and nursing note reviewed.  Constitutional:      General: She is awake. She is not in acute distress.    Appearance: She is well-developed and well-groomed. She is ill-appearing. She is not toxic-appearing.  HENT:     Head: Normocephalic.     Right Ear: Hearing, ear canal and external ear normal. A middle ear effusion is present. Tympanic membrane is not injected or perforated.     Left Ear: Hearing, ear canal and external ear normal. A middle ear effusion is present. Tympanic membrane is not injected or perforated.     Nose: Rhinorrhea present. Rhinorrhea is clear.  Right Sinus: Frontal sinus tenderness present. No maxillary sinus tenderness.     Left Sinus: Frontal sinus tenderness present. No maxillary sinus tenderness.     Mouth/Throat:     Mouth: Mucous membranes are moist.     Pharynx: Posterior oropharyngeal erythema (mild) present. No pharyngeal swelling or oropharyngeal exudate.  Eyes:     General: Lids are normal.        Right eye: No discharge.        Left eye: No discharge.     Conjunctiva/sclera: Conjunctivae normal.     Pupils: Pupils are equal, round, and reactive to light.  Neck:     Thyroid: No thyromegaly.      Vascular: No carotid bruit.  Cardiovascular:     Rate and Rhythm: Normal rate and regular rhythm.     Heart sounds: Normal heart sounds. No murmur heard.    No gallop.  Pulmonary:     Effort: Pulmonary effort is normal. No accessory muscle usage or respiratory distress.     Breath sounds: Normal breath sounds. No decreased breath sounds, wheezing or rhonchi.  Abdominal:     General: Bowel sounds are normal.     Palpations: Abdomen is soft. There is no hepatomegaly or splenomegaly.  Musculoskeletal:     Cervical back: Normal range of motion and neck supple.     Right lower leg: No edema.     Left lower leg: No edema.  Lymphadenopathy:     Head:     Right side of head: No submental, submandibular, tonsillar, preauricular or posterior auricular adenopathy.     Left side of head: No submental, submandibular, tonsillar, preauricular or posterior auricular adenopathy.     Cervical: No cervical adenopathy.  Skin:    General: Skin is warm and dry.  Neurological:     Mental Status: She is alert and oriented to person, place, and time.  Psychiatric:        Attention and Perception: Attention normal.        Mood and Affect: Mood normal.        Speech: Speech normal.        Behavior: Behavior normal. Behavior is cooperative.        Thought Content: Thought content normal.     Results for orders placed or performed during the hospital encounter of 04/17/23  Lipase, blood  Result Value Ref Range   Lipase 38 11 - 51 U/L  Comprehensive metabolic panel  Result Value Ref Range   Sodium 137 135 - 145 mmol/L   Potassium 3.4 (L) 3.5 - 5.1 mmol/L   Chloride 105 98 - 111 mmol/L   CO2 19 (L) 22 - 32 mmol/L   Glucose, Bld 106 (H) 70 - 99 mg/dL   BUN 8 8 - 23 mg/dL   Creatinine, Ser 0.10 0.44 - 1.00 mg/dL   Calcium 9.2 8.9 - 27.2 mg/dL   Total Protein 7.1 6.5 - 8.1 g/dL   Albumin 3.8 3.5 - 5.0 g/dL   AST 20 15 - 41 U/L   ALT 14 0 - 44 U/L   Alkaline Phosphatase 97 38 - 126 U/L   Total  Bilirubin 0.5 0.3 - 1.2 mg/dL   GFR, Estimated >53 >66 mL/min   Anion gap 13 5 - 15  CBC  Result Value Ref Range   WBC 6.7 4.0 - 10.5 K/uL   RBC 4.65 3.87 - 5.11 MIL/uL   Hemoglobin 14.6 12.0 - 15.0 g/dL   HCT 44.0 34.7 -  46.0 %   MCV 90.1 80.0 - 100.0 fL   MCH 31.4 26.0 - 34.0 pg   MCHC 34.8 30.0 - 36.0 g/dL   RDW 16.1 09.6 - 04.5 %   Platelets 228 150 - 400 K/uL   nRBC 0.0 0.0 - 0.2 %  TSH  Result Value Ref Range   TSH 0.665 0.350 - 4.500 uIU/mL      Assessment & Plan:   Problem List Items Addressed This Visit       Respiratory   Frontal sinusitis - Primary    Acute and present for one week.  Will not Covid or flu test today, is past treatment dates.  She gets frequent sinusitis. Would benefit return to ENT in future.  At this time start Augmentin and Prednisone treatment.  Recommend complete cessation of smoking.   - Increased rest - Increasing Fluids - Acetaminophen / ibuprofen as needed for fever/pain.  - Salt water gargling, chloraseptic spray and throat lozenges - Mucinex.  - Humidifying the air.       Relevant Medications   amoxicillin-clavulanate (AUGMENTIN) 875-125 MG tablet   predniSONE (DELTASONE) 20 MG tablet     Follow up plan: Return for as scheduled 05/27/23.

## 2023-05-13 ENCOUNTER — Ambulatory Visit
Admission: RE | Admit: 2023-05-13 | Discharge: 2023-05-13 | Disposition: A | Discharge status: Home / Self Care | Payer: 59 | Source: Ambulatory Visit | Attending: Acute Care | Admitting: Acute Care

## 2023-05-13 DIAGNOSIS — Z87891 Personal history of nicotine dependence: Secondary | ICD-10-CM | POA: Diagnosis present

## 2023-05-13 DIAGNOSIS — R911 Solitary pulmonary nodule: Secondary | ICD-10-CM | POA: Insufficient documentation

## 2023-05-25 ENCOUNTER — Other Ambulatory Visit: Payer: Self-pay

## 2023-05-25 DIAGNOSIS — Z122 Encounter for screening for malignant neoplasm of respiratory organs: Secondary | ICD-10-CM

## 2023-05-25 DIAGNOSIS — Z87891 Personal history of nicotine dependence: Secondary | ICD-10-CM

## 2023-05-25 DIAGNOSIS — F17210 Nicotine dependence, cigarettes, uncomplicated: Secondary | ICD-10-CM

## 2023-05-27 ENCOUNTER — Ambulatory Visit: Payer: 59 | Admitting: Nurse Practitioner

## 2023-05-27 DIAGNOSIS — K582 Mixed irritable bowel syndrome: Secondary | ICD-10-CM

## 2023-05-27 DIAGNOSIS — E0590 Thyrotoxicosis, unspecified without thyrotoxic crisis or storm: Secondary | ICD-10-CM

## 2023-05-27 DIAGNOSIS — E7800 Pure hypercholesterolemia, unspecified: Secondary | ICD-10-CM

## 2023-05-27 DIAGNOSIS — Z23 Encounter for immunization: Secondary | ICD-10-CM

## 2023-05-27 DIAGNOSIS — J432 Centrilobular emphysema: Secondary | ICD-10-CM

## 2023-05-27 DIAGNOSIS — F17219 Nicotine dependence, cigarettes, with unspecified nicotine-induced disorders: Secondary | ICD-10-CM

## 2023-05-27 DIAGNOSIS — F4310 Post-traumatic stress disorder, unspecified: Secondary | ICD-10-CM

## 2023-05-27 DIAGNOSIS — M797 Fibromyalgia: Secondary | ICD-10-CM

## 2023-05-27 DIAGNOSIS — F332 Major depressive disorder, recurrent severe without psychotic features: Secondary | ICD-10-CM

## 2023-05-27 DIAGNOSIS — L4050 Arthropathic psoriasis, unspecified: Secondary | ICD-10-CM

## 2023-05-28 ENCOUNTER — Other Ambulatory Visit: Payer: Self-pay | Admitting: Physician Assistant

## 2023-05-28 DIAGNOSIS — K580 Irritable bowel syndrome with diarrhea: Secondary | ICD-10-CM

## 2023-05-29 ENCOUNTER — Telehealth: Payer: Self-pay | Admitting: Nurse Practitioner

## 2023-05-29 NOTE — Telephone Encounter (Signed)
Called and notified patient of [name]'s message. Appointment scheduled for 06/09/23.

## 2023-05-29 NOTE — Telephone Encounter (Signed)
Copied from CRM [id number]. Topic: Referral - Request for Referral >> May 29, 2023 12:32 PM Patsy Lager T wrote: Has patient seen PCP for this complaint? Yes.   Referral for which specialty: Oncologist Preferred provider/office: [facility]  Reason for referral: breast, losing weight

## 2023-06-01 ENCOUNTER — Ambulatory Visit: Payer: 59 | Admitting: Gastroenterology

## 2023-06-02 ENCOUNTER — Emergency Department
Admission: EM | Admit: 2023-06-02 | Discharge: 2023-06-02 | Disposition: A | Discharge status: Home / Self Care | Payer: 59 | Attending: Emergency Medicine | Admitting: Emergency Medicine

## 2023-06-02 ENCOUNTER — Other Ambulatory Visit: Payer: Self-pay

## 2023-06-02 DIAGNOSIS — R531 Weakness: Secondary | ICD-10-CM | POA: Diagnosis not present

## 2023-06-02 DIAGNOSIS — Z5321 Procedure and treatment not carried out due to patient leaving prior to being seen by health care provider: Secondary | ICD-10-CM | POA: Diagnosis not present

## 2023-06-02 DIAGNOSIS — R55 Syncope and collapse: Secondary | ICD-10-CM | POA: Diagnosis present

## 2023-06-02 DIAGNOSIS — R634 Abnormal weight loss: Secondary | ICD-10-CM | POA: Diagnosis not present

## 2023-06-02 LAB — COMPREHENSIVE METABOLIC PANEL
ALT: 15 U/L (ref 0–44)
AST: 20 U/L (ref 15–41)
Albumin: 3.7 g/dL (ref 3.5–5.0)
Alkaline Phosphatase: 80 U/L (ref 38–126)
Anion gap: 10 (ref 5–15)
BUN: 8 mg/dL (ref 8–23)
CO2: 24 mmol/L (ref 22–32)
Calcium: 9.1 mg/dL (ref 8.9–10.3)
Chloride: 105 mmol/L (ref 98–111)
Creatinine, Ser: 0.85 mg/dL (ref 0.44–1.00)
GFR, Estimated: >60 mL/min (ref >60)
Glucose, Bld: 97 mg/dL (ref 70–99)
Potassium: 3.3 mmol/L — ABNORMAL LOW (ref 3.5–5.1)
Sodium: 139 mmol/L (ref 135–145)
Total Bilirubin: 0.6 mg/dL (ref 0.3–1.2)
Total Protein: 6.9 g/dL (ref 6.5–8.1)

## 2023-06-02 LAB — URINALYSIS, ROUTINE W REFLEX MICROSCOPIC
Bilirubin Urine: NEGATIVE
Glucose, UA: NEGATIVE mg/dL
Ketones, ur: 5 mg/dL — AB
Leukocytes,Ua: NEGATIVE
Nitrite: NEGATIVE
Protein, ur: NEGATIVE mg/dL
Specific Gravity, Urine: 1.014 (ref 1.005–1.030)
pH: 5 (ref 5.0–8.0)

## 2023-06-02 LAB — CBC WITH DIFFERENTIAL/PLATELET
Abs Immature Granulocytes: 0.01 10*3/uL (ref 0.00–0.07)
Basophils Absolute: 0.1 10*3/uL (ref 0.0–0.1)
Basophils Relative: 1 %
Eosinophils Absolute: 0.1 10*3/uL (ref 0.0–0.5)
Eosinophils Relative: 2 %
HCT: 41.1 % (ref 36.0–46.0)
Hemoglobin: 13.8 g/dL (ref 12.0–15.0)
Immature Granulocytes: 0 %
Lymphocytes Relative: 39 %
Lymphs Abs: 2.4 10*3/uL (ref 0.7–4.0)
MCH: 31.2 pg (ref 26.0–34.0)
MCHC: 33.6 g/dL (ref 30.0–36.0)
MCV: 93 fL (ref 80.0–100.0)
Monocytes Absolute: 0.4 10*3/uL (ref 0.1–1.0)
Monocytes Relative: 7 %
Neutro Abs: 3.2 10*3/uL (ref 1.7–7.7)
Neutrophils Relative %: 51 %
Platelets: 215 10*3/uL (ref 150–400)
RBC: 4.42 MIL/uL (ref 3.87–5.11)
RDW: 13.4 % (ref 11.5–15.5)
WBC: 6.1 10*3/uL (ref 4.0–10.5)
nRBC: 0 % (ref 0.0–0.2)

## 2023-06-02 NOTE — ED Triage Notes (Signed)
Patient states since she had a colonoscopy on 02/06/2023 she has lost 24lbs; had follow up's with GI but they diagnosed her with IBS. Patient states she has increased weakness and this morning had near syncopal episode.

## 2023-06-03 ENCOUNTER — Emergency Department
Admit: 2023-06-03 | Discharge: 2023-06-03 | Disposition: A | Discharge status: Home / Self Care | Payer: MEDICARE | Attending: Student in an Organized Health Care Education/Training Program

## 2023-06-03 ENCOUNTER — Ambulatory Visit
Admit: 2023-06-03 | Discharge: 2023-06-03 | Disposition: A | Discharge status: Home / Self Care | Payer: MEDICARE | Attending: Student in an Organized Health Care Education/Training Program

## 2023-06-03 DIAGNOSIS — R197 Diarrhea, unspecified: Principal | ICD-10-CM

## 2023-06-03 DIAGNOSIS — R1013 Epigastric pain: Principal | ICD-10-CM

## 2023-06-04 ENCOUNTER — Telehealth: Payer: Self-pay | Admitting: Nurse Practitioner

## 2023-06-04 DIAGNOSIS — R634 Abnormal weight loss: Secondary | ICD-10-CM

## 2023-06-04 DIAGNOSIS — E0590 Thyrotoxicosis, unspecified without thyrotoxic crisis or storm: Secondary | ICD-10-CM

## 2023-06-04 NOTE — Telephone Encounter (Signed)
Copied from CRM [id number]. Topic: Referral - Request for Referral >> Jun 04, 2023  9:43 AM Epimenio Foot F wrote: Reason for CRM: Pt's brother Viviann Spare is calling in because he took pt to the ER and was told by the ER doctor that pt needs to see an oncologist immediately. Viviann Spare says [name] wanted to see pt first but per Viviann Spare pt is extremely weak and it's going to be difficult getting her in the office. Viviann Spare says he spoke with [facility] and they said just needed a faxed referral and they could get the pt in to be seen. Viviann Spare says pt is currently 112 pounds and he is concerned about her health. Please follow up with Viviann Spare.

## 2023-06-04 NOTE — Telephone Encounter (Signed)
Patient's brother notified that referral has been placed.

## 2023-06-05 ENCOUNTER — Other Ambulatory Visit: Payer: Self-pay | Admitting: Nurse Practitioner

## 2023-06-05 DIAGNOSIS — Z1231 Encounter for screening mammogram for malignant neoplasm of breast: Secondary | ICD-10-CM

## 2023-06-05 NOTE — Telephone Encounter (Addendum)
Patient's brother, Brett Canales, has called and said that the referral was sent to the wrong place and that it needs to be sent to [facility]. Per Brett Canales, patient received a message that was sent to [facility] but I advised Brett Canales it is showing that the referral was sent to [facility] in her chart. Please advise and follow up with patient's brother @ # [phone]. Brett Canales stated he would give [facility] a call to see if they received this referral as well.

## 2023-06-05 NOTE — Telephone Encounter (Signed)
Brayton Caves can you check into this please?

## 2023-06-09 ENCOUNTER — Encounter: Payer: Self-pay | Admitting: Internal Medicine

## 2023-06-09 ENCOUNTER — Ambulatory Visit: Payer: 59 | Admitting: Nurse Practitioner

## 2023-06-09 ENCOUNTER — Inpatient Hospital Stay: Payer: 59 | Attending: Internal Medicine | Admitting: Internal Medicine

## 2023-06-09 ENCOUNTER — Inpatient Hospital Stay: Payer: 59

## 2023-06-09 VITALS — BP 115/80 | HR 80 | Temp 98.1°F | Ht 64.0 in | Wt 113.0 lb

## 2023-06-09 DIAGNOSIS — Z79621 Long term (current) use of calcineurin inhibitor: Secondary | ICD-10-CM | POA: Diagnosis not present

## 2023-06-09 DIAGNOSIS — Z79899 Other long term (current) drug therapy: Secondary | ICD-10-CM | POA: Insufficient documentation

## 2023-06-09 DIAGNOSIS — R112 Nausea with vomiting, unspecified: Secondary | ICD-10-CM | POA: Diagnosis not present

## 2023-06-09 DIAGNOSIS — Z803 Family history of malignant neoplasm of breast: Secondary | ICD-10-CM | POA: Insufficient documentation

## 2023-06-09 DIAGNOSIS — R197 Diarrhea, unspecified: Secondary | ICD-10-CM | POA: Diagnosis not present

## 2023-06-09 DIAGNOSIS — Z7969 Long term (current) use of other immunomodulators and immunosuppressants: Secondary | ICD-10-CM | POA: Diagnosis not present

## 2023-06-09 DIAGNOSIS — F17210 Nicotine dependence, cigarettes, uncomplicated: Secondary | ICD-10-CM | POA: Insufficient documentation

## 2023-06-09 DIAGNOSIS — R634 Abnormal weight loss: Secondary | ICD-10-CM | POA: Insufficient documentation

## 2023-06-09 NOTE — Telephone Encounter (Signed)
Faxed referral request to GYN at [facility] along with office notes from today's visit.

## 2023-06-09 NOTE — Progress Notes (Signed)
[facility]  [phone] [phone]  ID: Emily Johnston OB: Jan 16, 1961  MR#: [id number]  HKV#:[id number]  Patient Care Team: Marjie Skiff, NP as PCP - General (Nurse Practitioner) Salena Saner, MD as Consulting Physician (Pulmonary Disease) Michaelyn Barter, MD as Consulting Physician (Oncology)  REFERRING PROVIDER: Aura Dials, NP  REASON FOR REFERRAL: Unintentional weight loss  HPI: Emily Johnston is a 62 y.o. female with past medical history of COPD, fibromyalgia, hyperlipidemia, IBS, Psoriatec arthritis, PTSD was referred to oncology for concerns of unintentional weight loss.  In may 2024- weight loss. 140 lbs to 114 . Symptoms started after colonoscopy No appetite  Vomiting - once every other day Diarrhea - Viberzi 75 mg BID Fatigued, weak, dizziness - feels all in her head  Sob - on rest and exertion  Tremors  Chest tightness  Left breast pain - shooting pain - few years but worse now. Hx of breast cancer in mother age 35 Neck pain/ stiffness Gets disoriented - forgetfulness  Smoking - 1 ppd for 40 years  No alcohol or drug  CT chest abdomen pelvis without contrast from 02/24/2023 which showed nodule in the left lower lobe measuring 6 mm and 5 mm nodule.  2 mm solid nodule in the right upper lobe.  Metallic density in the cecum presumed polypectomy clip.  No acute findings.  Repeat CT in 6 months was recommended.  CT chest (05/13/2023)-showed no suspicious pulmonary nodule.  Annual screening with low-dose CT in 12 months was recommended.  Scheduled for mammogram on 06/11/2023  Colonoscopy from 02/06/2023 showed multiple polyps in the colon.  Repeat in 1 year was advised.  Path showed tubular adenoma.  She was advised genetic testing for multiple polyps and decided not to proceed after speaking with genetic counselor.  Endoscopy showed GE flap valve classified as Hill grade 3, single duodenal polyp.  REVIEW OF  SYSTEMS:   ROS  As per HPI. Otherwise, a complete review of systems is negative.  PAST MEDICAL HISTORY: Past Medical History:  Diagnosis Date   Anxiety    Arthritis    COPD (chronic obstructive pulmonary disease) (HCC)    Degenerative disc disease, cervical    Depression    Fibromyalgia    GERD (gastroesophageal reflux disease)    HLD (hyperlipidemia)    IBS (irritable bowel syndrome)    Psoriasis    PTSD (post-traumatic stress disorder)     PAST SURGICAL HISTORY: Past Surgical History:  Procedure Laterality Date   CARPAL TUNNEL RELEASE     COLONOSCOPY WITH PROPOFOL N/A 02/06/2023   Procedure: COLONOSCOPY WITH PROPOFOL;  Surgeon: Wyline Mood, MD;  Location: [facility] ENDOSCOPY;  Service: Endoscopy;  Laterality: N/A;   ESOPHAGOGASTRODUODENOSCOPY (EGD) WITH PROPOFOL N/A 02/06/2023   Procedure: ESOPHAGOGASTRODUODENOSCOPY (EGD) WITH PROPOFOL;  Surgeon: Wyline Mood, MD;  Location: [facility] ENDOSCOPY;  Service: Endoscopy;  Laterality: N/A;   KNEE SURGERY     NECK SURGERY     TOTAL ABDOMINAL HYSTERECTOMY      FAMILY HISTORY: Family History  Problem Relation Age of Onset   Breast cancer Mother 50    HEALTH MAINTENANCE: Social History   Tobacco Use   Smoking status: Every Day    Current packs/day: 1.00    Average packs/day: 1 pack/day for 41.0 years (41.0 ttl pk-yrs)    Types: Cigarettes   Smokeless tobacco: Never   Tobacco comments:    1 PPD - 12/23/2022 khj  Vaping Use   Vaping status: Never Used  Substance  Use Topics   Alcohol use: No   Drug use: No     No Known Allergies  Current Outpatient Medications  Medication Sig Dispense Refill   albuterol (VENTOLIN HFA) 108 (90 Base) MCG/ACT inhaler Inhale 2 puffs into the lungs every 6 (six) hours as needed for wheezing or shortness of breath. 1 each 3   clonazePAM (KLONOPIN) 1 MG tablet Take 1 mg by mouth daily as needed.     DULoxetine (CYMBALTA) 60 MG capsule Take 2 capsules (120 mg total) by mouth daily. 180 capsule 4    FLUoxetine (PROZAC) 40 MG capsule Take 40 mg by mouth daily.     fluticasone (FLONASE) 50 MCG/ACT nasal spray SPRAY 2 SPRAYS INTO EACH NOSTRIL EVERY DAY 48 mL 2   Fluticasone-Umeclidin-Vilant (TRELEGY ELLIPTA) 200-62.5-25 MCG/ACT AEPB Inhale 1 puff into the lungs daily. 60 each 11   folic acid (FOLVITE) 1 MG tablet Take 1 mg by mouth daily.     hydrocortisone 1 % ointment Apply 1 Application topically 2 (two) times daily. 30 g 0   leflunomide (ARAVA) 20 MG tablet Take 20 mg by mouth daily.     meclizine (ANTIVERT) 12.5 MG tablet Take 1 tablet (12.5 mg total) by mouth 3 (three) times daily as needed for dizziness. 60 tablet 1   methimazole (TAPAZOLE) 5 MG tablet Take 5 mg by mouth every other day.     montelukast (SINGULAIR) 10 MG tablet Take 1 tablet (10 mg total) by mouth at bedtime. 90 tablet 4   Multiple Vitamin (MULTIVITAMIN) tablet Take 1 tablet by mouth daily.     ondansetron (ZOFRAN) 4 MG tablet Take 1 tablet (4 mg total) by mouth every 8 (eight) hours as needed for nausea or vomiting. 90 tablet 12   OTEZLA 30 MG TABS Take 1 tablet by mouth 2 (two) times daily.     pantoprazole (PROTONIX) 40 MG tablet Take 40 mg by mouth daily.     rosuvastatin (CRESTOR) 10 MG tablet Take 1 tablet (10 mg total) by mouth daily. 30 tablet 6   VIBERZI 100 MG TABS Take 100 mg by mouth 2 (two) times daily.     Cholecalciferol 1.25 MG (50000 UT) TABS Take 1 tablet by mouth once a week. (Patient not taking: Reported on 06/09/2023) 12 tablet 4   clobetasol ointment (TEMOVATE) 0.05 % Apply 1 application. topically 2 (two) times daily. (Patient not taking: Reported on 06/09/2023)     doxepin (SINEQUAN) 100 MG capsule Take 100 mg by mouth at bedtime. (Patient not taking: Reported on 06/09/2023)     hyoscyamine (LEVBID) 0.375 MG 12 hr tablet Take 1 tablet (0.375 mg total) by mouth 2 (two) times daily. 60 tablet 0   QUEtiapine (SEROQUEL) 50 MG tablet Take 50 mg by mouth at bedtime. (Patient not taking: Reported on  06/09/2023)     tacrolimus (PROTOPIC) 0.1 % ointment 2 (two) times daily. (Patient not taking: Reported on 06/09/2023)     No current facility-administered medications for this visit.    OBJECTIVE: Vitals:   06/09/23 1142  BP: 115/80  Pulse: 80  Temp: 98.1 F (36.7 C)     Body mass index is 19.4 kg/m.      General: Well-developed, well-nourished, no acute distress. Eyes: Pink conjunctiva, anicteric sclera. HEENT: Normocephalic, moist mucous membranes, clear oropharnyx. Lungs: Clear to auscultation bilaterally. Heart: Regular rate and rhythm. No rubs, murmurs, or gallops. Abdomen: Soft, nontender, nondistended. No organomegaly noted, normoactive bowel sounds. Musculoskeletal: No edema, cyanosis, or clubbing. Neuro:  Alert, answering all questions appropriately. Cranial nerves grossly intact. Skin: No rashes or petechiae noted. Psych: Normal affect. Lymphatics: No cervical, calvicular, axillary or inguinal LAD.   LAB RESULTS:  Lab Results  Component Value Date   NA 139 06/02/2023   K 3.3 (L) 06/02/2023   CL 105 06/02/2023   CO2 24 06/02/2023   GLUCOSE 97 06/02/2023   BUN 8 06/02/2023   CREATININE 0.85 06/02/2023   CALCIUM 9.1 06/02/2023   PROT 6.9 06/02/2023   ALBUMIN 3.7 06/02/2023   AST 20 06/02/2023   ALT 15 06/02/2023   ALKPHOS 80 06/02/2023   BILITOT 0.6 06/02/2023   GFRNONAA >60 06/02/2023   GFRAA 95 10/29/2020    Lab Results  Component Value Date   WBC 6.1 06/02/2023   NEUTROABS 3.2 06/02/2023   HGB 13.8 06/02/2023   HCT 41.1 06/02/2023   MCV 93.0 06/02/2023   PLT 215 06/02/2023    Lab Results  Component Value Date   TIBC 358 02/24/2023   FERRITIN 117 02/24/2023   IRONPCTSAT 24 02/24/2023     STUDIES: CT CHEST LCS NODULE F/U LOW DOSE WO CONTRAST  Result Date: 05/22/2023 CLINICAL DATA:  Abnormal lung cancer screening CT. Current smoker, 44 pack-year history. EXAM: CT CHEST WITHOUT CONTRAST FOR LUNG CANCER SCREENING NODULE FOLLOW-UP TECHNIQUE:  Multidetector CT imaging of the chest was performed following the standard protocol without IV contrast. RADIATION DOSE REDUCTION: This exam was performed according to the departmental dose-optimization program which includes automated exposure control, adjustment of the mA and/or kV according to patient size and/or use of iterative reconstruction technique. COMPARISON:  02/24/2023, 11/12/2022 and 11/12/2021. FINDINGS: Cardiovascular: Atherosclerotic calcification of the aorta with age advanced involvement of the coronary arteries. Heart size normal. No pericardial effusion. Mediastinum/Nodes: No pathologically enlarged mediastinal or axillary lymph nodes. Hilar regions are difficult to definitively evaluate without IV contrast. Esophagus is grossly unremarkable. Lungs/Pleura: Mild centrilobular and paraseptal emphysema. Smoking related respiratory bronchiolitis. Pulmonary nodules measure 13.8 mm or less in size, as before. No suspicious pulmonary nodules. No pleural fluid. Debris in the airway. Upper Abdomen: Visualized portions of the liver, adrenal glands, kidneys, spleen, pancreas, stomach and bowel are grossly unremarkable. No upper abdominal adenopathy. Musculoskeletal: Degenerative changes in the spine. No worrisome lytic or sclerotic lesions. IMPRESSION: 1. Lung-RADS 2, benign appearance or behavior. Continue annual screening with low-dose chest CT without contrast in 12 months. 2. Age advanced coronary artery calcification. 3.  Aortic atherosclerosis (ICD10-I70.0). 4.  Emphysema (ICD10-J43.9). Electronically Signed   By: Leanna Battles M.D.   On: 05/22/2023 15:38    ASSESSMENT AND PLAN:   Emily Johnston is a 62 y.o. female with pmh of COPD, fibromyalgia, hyperlipidemia, IBS, Psoriatec arthritis, PTSD was referred to oncology for concerns of unintentional weight loss.  # Unintentional weight loss # Nausea/diarrhea/fatigue -Weight loss of about 25 pounds since May 2024.  - CT chest abdomen  pelvis without contrast from 02/24/2023 which showed nodule in the left lower lobe measuring 6 mm and 5 mm nodule.  2 mm solid nodule in the right upper lobe.  Metallic density in the cecum presumed polypectomy clip.  No acute findings.  Repeat CT in 6 months was recommended.  -CT chest (05/13/2023)-showed no suspicious pulmonary nodule.  Annual screening with low-dose CT in 12 months was recommended.  - Scheduled for mammogram on 06/11/2023.  I will follow-up.  - Colonoscopy from 02/06/2023 showed multiple polyps in the colon.  Repeat in 1 year was advised.  Path showed tubular  adenoma.  She was advised genetic testing for multiple polyps and decided not to proceed after speaking with genetic counselor.  Endoscopy showed GE flap valve classified as Hill grade 3, single duodenal polyp.  -Discussed with the patient and the brother that on imaging there is no evidence of cancer.  Her labs look unremarkable.  She is up to date with her age-related screening except cervical cancer screening.  Patient is interested in seeing OB and have her Pap smear.  Never had Pap smear in the past.  Will make a referral.  There is no role for checking for tumor markers.  Patient has follow-up with GI next month to discuss about medications for IBS.  She will follow-up with me as needed.  RTC PRN   Patient expressed understanding and was in agreement with this plan. She also understands that She can call clinic at any time with any questions, concerns, or complaints.   I spent a total of 45 minutes reviewing chart data, face-to-face evaluation with the patient, counseling and coordination of care as detailed above.  Michaelyn Barter, MD   06/09/2023 12:02 PM

## 2023-06-11 ENCOUNTER — Ambulatory Visit
Admission: RE | Admit: 2023-06-11 | Discharge: 2023-06-11 | Disposition: A | Discharge status: Home / Self Care | Payer: 59 | Source: Ambulatory Visit | Attending: Nurse Practitioner | Admitting: Nurse Practitioner

## 2023-06-11 DIAGNOSIS — Z1231 Encounter for screening mammogram for malignant neoplasm of breast: Secondary | ICD-10-CM | POA: Insufficient documentation

## 2023-06-12 NOTE — Progress Notes (Signed)
Contacted via MyChart   Normal mammogram, may repeat in one year:)

## 2023-06-17 ENCOUNTER — Encounter: Payer: Self-pay | Admitting: Nurse Practitioner

## 2023-06-17 ENCOUNTER — Ambulatory Visit (INDEPENDENT_AMBULATORY_CARE_PROVIDER_SITE_OTHER): Payer: 59 | Admitting: Nurse Practitioner

## 2023-06-17 VITALS — BP 134/87 | HR 68 | Temp 98.0°F | Resp 12 | Ht 64.0 in | Wt 112.4 lb

## 2023-06-17 DIAGNOSIS — J0111 Acute recurrent frontal sinusitis: Secondary | ICD-10-CM

## 2023-06-17 DIAGNOSIS — R634 Abnormal weight loss: Secondary | ICD-10-CM | POA: Diagnosis not present

## 2023-06-17 DIAGNOSIS — F332 Major depressive disorder, recurrent severe without psychotic features: Secondary | ICD-10-CM | POA: Diagnosis not present

## 2023-06-17 DIAGNOSIS — K580 Irritable bowel syndrome with diarrhea: Secondary | ICD-10-CM | POA: Diagnosis not present

## 2023-06-17 DIAGNOSIS — F4310 Post-traumatic stress disorder, unspecified: Secondary | ICD-10-CM | POA: Diagnosis not present

## 2023-06-17 MED ORDER — FLUCONAZOLE 150 MG PO TABS: 150.0000 mg | ORAL_TABLET | Freq: Once | ORAL | 0 refills | Status: AC

## 2023-06-17 MED ORDER — AMOXICILLIN-POT CLAVULANATE 875-125 MG PO TABS: 1.0000 | ORAL_TABLET | Freq: Two times a day (BID) | ORAL | 0 refills | Status: AC

## 2023-06-17 MED ORDER — PREDNISONE 10 MG PO TABS: ORAL_TABLET | ORAL | 0 refills | Status: DC

## 2023-06-17 NOTE — Assessment & Plan Note (Signed)
Ongoing issue, continues to lose weight.  Has had work-up with GI, oncology, and imaging ordered by PCP - all has been reassuring.  She reports GI suspects she has severe IBS-D.  Suspect much of this is related to her anxiety, which discussed with her today.  Educated her on anxiety and the effect on gut health.  Her anxiety levels continue to be elevated, this has been ongoing since loss of her mother to cancer.  She sees psychiatry, have recommended she have at length discussion with them on medication regimen, as may need to further adjust.  Instructed her to alert psychiatry they can reach of to PCP if any concerns or questions.  Provided Ensure to take home.  May benefit from Mirtazapine in future, will see if psychiatry adjusts regimen in anyway.

## 2023-06-17 NOTE — Addendum Note (Signed)
Addended by: Aura Dials T on: 06/17/2023 05:38 PM   Modules accepted: Level of Service

## 2023-06-17 NOTE — Assessment & Plan Note (Signed)
Acute for 2 weeks with no improvement.  Will start Augmentin which has offered benefit in past and provide longer steroid taper which may help appetite and infection.  Educated her on plan on of care.  Recommend: - Increased rest - Increasing Fluids - Acetaminophen as needed for fever/pain.  - Salt water gargling, chloraseptic spray and throat lozenges - Mucinex.  - Saline sinus flushes or a neti pot.  - Humidifying the air.

## 2023-06-17 NOTE — Patient Instructions (Signed)

## 2023-06-17 NOTE — Assessment & Plan Note (Signed)
Chronic and not under control.  GAD = 18 and she has multiple anxiety symptoms - weight loss, appetite change, shaking, fatigue + worsening of her IBS-D.  She follows with psychiatry, appreciate their input.  Suspect much of her current weight loss and worsened IBS-D is coming from her anxiety, which discussed with her today.  Educated her on anxiety and the effect on gut health.  Her anxiety level continues to be elevated, this has been ongoing since loss of her mother to cancer.  Recommend she have at length discussion with psychiatry on medication regimen, as may need to further adjust.  Instructed her to alert psychiatry they can reach out to PCP if any concerns or questions. May also benefit therapy, which she is on wait list for with psychiatry.

## 2023-06-17 NOTE — Assessment & Plan Note (Signed)
Chronic and not controlled.  Denies SI/HI.  She follows with psychiatry, appreciate their input.  Suspect much of her current weight loss and worsened IBS-D is coming from her anxiety, which discussed with her today.  Educated her on anxiety and the effect on gut health.  Her anxiety level continues to be elevated, this has been ongoing since loss of her mother to cancer.  Recommend she have at length discussion with psychiatry on medication regimen, as may need to further adjust.  Instructed her to alert psychiatry they can reach out to PCP if any concerns or questions.  Her current PHQ = 21 and GAD = 18.

## 2023-06-17 NOTE — Assessment & Plan Note (Signed)
Ongoing and has been exacerbated since loss of her mother.  Continue to collaborate with GI and medication regimen as ordered by them.  Suspect much of this is exacerbated by her anxiety, which discussed with her today.  Educated her on anxiety and the effect on gut health.  Her anxiety level continues to be elevated, this has been ongoing since loss of her mother to cancer.  She sees psychiatry, have recommended she have at length discussion with them on medication regimen, as may need to further adjust.  Instructed her to alert psychiatry they can reach of to PCP if any concerns or questions.

## 2023-06-17 NOTE — Progress Notes (Addendum)
BP 134/87 (BP Location: Left Arm, Patient Position: Sitting, Cuff Size: Normal)   Pulse 68   Temp 98 F (36.7 C) (Oral)   Resp 12   Ht 5\' 4"  (1.626 m)   Wt 112 lb 6.4 oz (51 kg)   SpO2 94%   BMI 19.29 kg/m    Subjective:    Patient ID: Emily Johnston, female    DOB: September 21, 1960, 62 y.o.   MRN: [id number]  HPI: Emily Johnston is a 62 y.o. female  Chief Complaint  Patient presents with   Dizziness   Ear Fullness    Left x2-3 weeks   Cough    With mucous production   Hiccups   Weight Loss   Brother at bedside to assist with HPI.  WEIGHT LOSS  Follow-up today for weight loss and abdominal pain.  Present since colonoscopy procedure May 24th. 2024.  Reports fatigue, lack of energy & weight loss.  Has had work-up by GI (both [facility]) and oncology which have been reassuring -- GI feels she has severe IBS-D -- taking Viberzi but does not feel like it is offering benefit.  Recent CT lung and mammogram reassuring with no concerning findings.  Labs have overall been reassuring with exception of thyroid, which she saw endocrinology for and they started her on Methimazole.  Weight was 136 lbs in April 2024, now is 112 lbs.  She reports no appetite.    Following with [facility] for psychiatry -- taking Prozac and Klonopin.  Ordered Seroquel and not taking.   Duration:weeks Onset: gradual Episode duration: since 02/06/23 Frequency: improving, intermittent Alleviating factors: laying down Aggravating factors: nothing Status: improving Treatments attempted: Imodium - Bentyl (started by GI), probiotic, Omeprazole Fever: no Nausea: yes Vomiting: no -- takes Zofran as needed Weight loss: yes Decreased appetite: yes Diarrhea:  improved Constipation: no Blood in stool: no Heartburn: yes Jaundice: no Rash: per baseline with her psoriasis Dysuria/urinary frequency: yes Hematuria: no History of sexually transmitted disease: no Recurrent NSAID use: no      06/17/2023    3:29 PM 06/09/2023   12:06 PM 05/04/2023    3:43 PM 03/12/2023   11:28 AM 02/24/2023    1:22 PM  Depression screen PHQ 2/9  Decreased Interest 3 3 3 1 3   Down, Depressed, Hopeless 3 3 3 2 3   PHQ - 2 Score 6 6 6 3 6   Altered sleeping 2  2 3 2   Tired, decreased energy 3  3 2 3   Change in appetite 3  3 3 3   Feeling bad or failure about yourself  2  1 3  0  Trouble concentrating 3  2 1 2   Moving slowly or fidgety/restless 2  2 1 2   Suicidal thoughts 0  0 0 0  PHQ-9 Score 21  19 16 18   Difficult doing work/chores Very difficult  Very difficult Somewhat difficult Very difficult       06/17/2023    3:30 PM 05/04/2023    3:44 PM 03/12/2023   11:28 AM 02/24/2023    1:22 PM  GAD 7 : Generalized Anxiety Score  Nervous, Anxious, on Edge 3 3 1 2   Control/stop worrying 3 3 1 1   Worry too much - different things 3 3 1 1   Trouble relaxing 3 2 1 2   Restless 2 1 0 0  Easily annoyed or irritable 3 1 2 2   Afraid - awful might happen 1 2 3    Total GAD 7 Score 18  15 9   Anxiety Difficulty Very difficult Very difficult Somewhat difficult Extremely difficult   UPPER RESPIRATORY TRACT INFECTION Started two weeks ago with sinus pressure and ear discomfort + productive cough + dizziness.  Underlying emphysema. Fever: no Cough: yes Shortness of breath: no Wheezing: no Chest pain: no Chest tightness: yes Chest congestion: yes Nasal congestion: yes Runny nose: no Post nasal drip: yes Sneezing: no Sore throat: no Swollen glands: no Sinus pressure: yes Headache: yes Face pain: no Toothache: no Ear pain: none Ear pressure: yes bilateral Eyes red/itching:no Eye drainage/crusting: no  Vomiting: no Rash: no Fatigue: yes Sick contacts: no Strep contacts: no  Context: fluctuating Recurrent sinusitis: no Relief with OTC cold/cough medications: no  Treatments attempted: OTC cold medications    Relevant past medical, surgical, family and social history reviewed and updated as  indicated. Interim medical history since our last visit reviewed. Allergies and medications reviewed and updated.  Review of Systems  Constitutional:  Positive for appetite change and fatigue. Negative for activity change, diaphoresis and fever.  HENT:  Positive for congestion, postnasal drip, sinus pressure and sinus pain. Negative for ear discharge, ear pain, rhinorrhea, sore throat and voice change.   Respiratory:  Positive for cough and chest tightness. Negative for shortness of breath and wheezing.   Cardiovascular:  Negative for chest pain, palpitations and leg swelling.  Gastrointestinal:  Positive for abdominal distention, diarrhea and nausea. Negative for abdominal pain, constipation and vomiting.  Endocrine: Negative for cold intolerance and heat intolerance.  Neurological:  Positive for dizziness, weakness and headaches. Negative for syncope, light-headedness and numbness.  Psychiatric/Behavioral:  Positive for sleep disturbance. Negative for decreased concentration, self-injury and suicidal ideas. The patient is nervous/anxious.    Per HPI unless specifically indicated above     Objective:    BP 134/87 (BP Location: Left Arm, Patient Position: Sitting, Cuff Size: Normal)   Pulse 68   Temp 98 F (36.7 C) (Oral)   Resp 12   Ht 5\' 4"  (1.626 m)   Wt 112 lb 6.4 oz (51 kg)   SpO2 94%   BMI 19.29 kg/m   Wt Readings from Last 3 Encounters:  06/17/23 112 lb 6.4 oz (51 kg)  06/09/23 113 lb (51.3 kg)  05/04/23 119 lb 9.6 oz (54.3 kg)    Physical Exam Vitals and nursing note reviewed.  Constitutional:      General: She is awake. She is not in acute distress.    Appearance: She is well-developed, well-groomed and underweight. She is not ill-appearing or toxic-appearing.  HENT:     Head: Normocephalic.     Right Ear: Hearing, ear canal and external ear normal. A middle ear effusion is present. Tympanic membrane is not injected.     Left Ear: Hearing, ear canal and external ear  normal. A middle ear effusion is present. Tympanic membrane is not injected.     Nose: Rhinorrhea present. Rhinorrhea is clear.     Right Sinus: Frontal sinus tenderness present. No maxillary sinus tenderness.     Left Sinus: Frontal sinus tenderness present. No maxillary sinus tenderness.     Mouth/Throat:     Mouth: Mucous membranes are moist.     Pharynx: Posterior oropharyngeal erythema (mild) present. No pharyngeal swelling or oropharyngeal exudate.  Eyes:     General: Lids are normal.        Right eye: No discharge.        Left eye: No discharge.     Conjunctiva/sclera: Conjunctivae normal.  Pupils: Pupils are equal, round, and reactive to light.  Neck:     Thyroid: No thyromegaly.     Vascular: No carotid bruit.  Cardiovascular:     Rate and Rhythm: Normal rate and regular rhythm.     Heart sounds: Normal heart sounds. No murmur heard.    No gallop.  Pulmonary:     Effort: Pulmonary effort is normal. No accessory muscle usage or respiratory distress.     Breath sounds: Normal breath sounds.  Abdominal:     General: Bowel sounds are normal. There is no distension.     Palpations: Abdomen is soft.     Tenderness: There is no abdominal tenderness.  Musculoskeletal:     Cervical back: Normal range of motion and neck supple.     Right lower leg: No edema.     Left lower leg: No edema.  Lymphadenopathy:     Cervical: No cervical adenopathy.  Skin:    General: Skin is warm and dry.  Neurological:     Mental Status: She is alert and oriented to person, place, and time.     Deep Tendon Reflexes: Reflexes are normal and symmetric.     Reflex Scores:      Brachioradialis reflexes are 2+ on the right side and 2+ on the left side.      Patellar reflexes are 2+ on the right side and 2+ on the left side. Psychiatric:        Attention and Perception: Attention normal.        Mood and Affect: Mood is anxious. Affect is tearful.        Speech: Speech normal.        Behavior:  Behavior normal. Behavior is cooperative.        Thought Content: Thought content normal.     Comments: Very anxious, hands shaking, tearful.    Results for orders placed or performed during the hospital encounter of 04/17/23  Lipase, blood  Result Value Ref Range   Lipase 38 11 - 51 U/L  Comprehensive metabolic panel  Result Value Ref Range   Sodium 137 135 - 145 mmol/L   Potassium 3.4 (L) 3.5 - 5.1 mmol/L   Chloride 105 98 - 111 mmol/L   CO2 19 (L) 22 - 32 mmol/L   Glucose, Bld 106 (H) 70 - 99 mg/dL   BUN 8 8 - 23 mg/dL   Creatinine, Ser 1.61 0.44 - 1.00 mg/dL   Calcium 9.2 8.9 - 09.6 mg/dL   Total Protein 7.1 6.5 - 8.1 g/dL   Albumin 3.8 3.5 - 5.0 g/dL   AST 20 15 - 41 U/L   ALT 14 0 - 44 U/L   Alkaline Phosphatase 97 38 - 126 U/L   Total Bilirubin 0.5 0.3 - 1.2 mg/dL   GFR, Estimated >04 >54 mL/min   Anion gap 13 5 - 15  CBC  Result Value Ref Range   WBC 6.7 4.0 - 10.5 K/uL   RBC 4.65 3.87 - 5.11 MIL/uL   Hemoglobin 14.6 12.0 - 15.0 g/dL   HCT 09.8 11.9 - 14.7 %   MCV 90.1 80.0 - 100.0 fL   MCH 31.4 26.0 - 34.0 pg   MCHC 34.8 30.0 - 36.0 g/dL   RDW 82.9 56.2 - 13.0 %   Platelets 228 150 - 400 K/uL   nRBC 0.0 0.0 - 0.2 %  TSH  Result Value Ref Range   TSH 0.665 0.350 - 4.500 uIU/mL  Assessment & Plan:   Problem List Items Addressed This Visit       Respiratory   Frontal sinusitis    Acute for 2 weeks with no improvement.  Will start Augmentin which has offered benefit in past and provide longer steroid taper which may help appetite and infection.  Educated her on plan on of care.  Recommend: - Increased rest - Increasing Fluids - Acetaminophen as needed for fever/pain.  - Salt water gargling, chloraseptic spray and throat lozenges - Mucinex.  - Saline sinus flushes or a neti pot.  - Humidifying the air.       Relevant Medications   predniSONE (DELTASONE) 10 MG tablet   amoxicillin-clavulanate (AUGMENTIN) 875-125 MG tablet     Digestive   IBS  (irritable bowel syndrome)    Ongoing and has been exacerbated since loss of her mother.  Continue to collaborate with GI and medication regimen as ordered by them.  Suspect much of this is exacerbated by her anxiety, which discussed with her today.  Educated her on anxiety and the effect on gut health.  Her anxiety level continues to be elevated, this has been ongoing since loss of her mother to cancer.  She sees psychiatry, have recommended she have at length discussion with them on medication regimen, as may need to further adjust.  Instructed her to alert psychiatry they can reach of to PCP if any concerns or questions.          Other   PTSD (post-traumatic stress disorder)    Chronic and not under control.  GAD = 18 and she has multiple anxiety symptoms - weight loss, appetite change, shaking, fatigue + worsening of her IBS-D.  She follows with psychiatry, appreciate their input.  Suspect much of her current weight loss and worsened IBS-D is coming from her anxiety, which discussed with her today.  Educated her on anxiety and the effect on gut health.  Her anxiety level continues to be elevated, this has been ongoing since loss of her mother to cancer.  Recommend she have at length discussion with psychiatry on medication regimen, as may need to further adjust.  Instructed her to alert psychiatry they can reach out to PCP if any concerns or questions. May also benefit therapy, which she is on wait list for with psychiatry.      Severe recurrent major depression without psychotic features (HCC) - Primary    Chronic and not controlled.  Denies SI/HI.  She follows with psychiatry, appreciate their input.  Suspect much of her current weight loss and worsened IBS-D is coming from her anxiety, which discussed with her today.  Educated her on anxiety and the effect on gut health.  Her anxiety level continues to be elevated, this has been ongoing since loss of her mother to cancer.  Recommend she have at  length discussion with psychiatry on medication regimen, as may need to further adjust.  Instructed her to alert psychiatry they can reach out to PCP if any concerns or questions.  Her current PHQ = 21 and GAD = 18.      Unintended weight loss    Ongoing issue, continues to lose weight.  Has had work-up with GI, oncology, and imaging ordered by PCP - all has been reassuring.  She reports GI suspects she has severe IBS-D.  Suspect much of this is related to her anxiety, which discussed with her today.  Educated her on anxiety and the effect on gut health.  Her anxiety  levels continue to be elevated, this has been ongoing since loss of her mother to cancer.  She sees psychiatry, have recommended she have at length discussion with them on medication regimen, as may need to further adjust.  Instructed her to alert psychiatry they can reach of to PCP if any concerns or questions.  Provided Ensure to take home.  May benefit from Mirtazapine in future, will see if psychiatry adjusts regimen in anyway.         Time: 25 minutes, >50% spent counseling/or care coordination and discussing current health.   Follow up plan: Return for as scheduled October 15th.

## 2023-06-18 ENCOUNTER — Telehealth: Payer: Self-pay | Admitting: Nurse Practitioner

## 2023-06-18 NOTE — Telephone Encounter (Signed)
Copied from CRM [id number]. Topic: Complaint - Sensitive >> Jun 18, 2023  1:32 PM Lennox Pippins wrote: Patient has called in regards to she has had alot of GI issues, she states she has IBS, that has caused her to lose weight and she states she saw her regular PCP, Aura Dials, NP for a visit yesterday on 10.2.2024 and she states the nurse working for [employer] yesterday (patient states not the regular nurse), put on her paperwork that she is anorexic and patient states she is not anorexic that she is trying to gain weight, not lose it, and she states this could hurt her when she goes to GI appointments and would like a call back regarding this. Patients callback # [phone]

## 2023-06-18 NOTE — Telephone Encounter (Signed)
Patient called back as she is concerned about the notation of her being anorexic was placed in her chart. Please f/u with patient

## 2023-06-19 NOTE — Telephone Encounter (Signed)
Notified pt --have been created/edit for the reason for visit section per Corrie Dandy, NP. Pt voiced understanding.

## 2023-06-22 ENCOUNTER — Emergency Department: Payer: 59

## 2023-06-22 ENCOUNTER — Ambulatory Visit: Payer: Self-pay

## 2023-06-22 ENCOUNTER — Other Ambulatory Visit: Payer: Self-pay

## 2023-06-22 ENCOUNTER — Telehealth: Payer: Self-pay | Admitting: Nurse Practitioner

## 2023-06-22 ENCOUNTER — Emergency Department
Admission: EM | Admit: 2023-06-22 | Discharge: 2023-06-22 | Disposition: A | Discharge status: Home / Self Care | Payer: 59 | Attending: Emergency Medicine | Admitting: Emergency Medicine

## 2023-06-22 DIAGNOSIS — J449 Chronic obstructive pulmonary disease, unspecified: Secondary | ICD-10-CM | POA: Insufficient documentation

## 2023-06-22 DIAGNOSIS — R109 Unspecified abdominal pain: Secondary | ICD-10-CM | POA: Insufficient documentation

## 2023-06-22 LAB — CBC
HCT: 40.2 % (ref 36.0–46.0)
Hemoglobin: 13.5 g/dL (ref 12.0–15.0)
MCH: 31.2 pg (ref 26.0–34.0)
MCHC: 33.6 g/dL (ref 30.0–36.0)
MCV: 92.8 fL (ref 80.0–100.0)
Platelets: 205 10*3/uL (ref 150–400)
RBC: 4.33 MIL/uL (ref 3.87–5.11)
RDW: 13.2 % (ref 11.5–15.5)
WBC: 9.6 10*3/uL (ref 4.0–10.5)
nRBC: 0 % (ref 0.0–0.2)

## 2023-06-22 LAB — BASIC METABOLIC PANEL
Anion gap: 12 (ref 5–15)
BUN: 17 mg/dL (ref 8–23)
CO2: 25 mmol/L (ref 22–32)
Calcium: 9.2 mg/dL (ref 8.9–10.3)
Chloride: 104 mmol/L (ref 98–111)
Creatinine, Ser: 0.71 mg/dL (ref 0.44–1.00)
GFR, Estimated: >60 mL/min (ref >60)
Glucose, Bld: 115 mg/dL — ABNORMAL HIGH (ref 70–99)
Potassium: 3 mmol/L — ABNORMAL LOW (ref 3.5–5.1)
Sodium: 141 mmol/L (ref 135–145)

## 2023-06-22 LAB — URINALYSIS, ROUTINE W REFLEX MICROSCOPIC
Bilirubin Urine: NEGATIVE
Glucose, UA: NEGATIVE mg/dL
Hgb urine dipstick: NEGATIVE
Ketones, ur: NEGATIVE mg/dL
Leukocytes,Ua: NEGATIVE
Nitrite: NEGATIVE
Protein, ur: NEGATIVE mg/dL
Specific Gravity, Urine: 1.011 (ref 1.005–1.030)
pH: 7 (ref 5.0–8.0)

## 2023-06-22 LAB — HEPATIC FUNCTION PANEL
ALT: 22 U/L (ref 0–44)
AST: 24 U/L (ref 15–41)
Albumin: 3.9 g/dL (ref 3.5–5.0)
Alkaline Phosphatase: 68 U/L (ref 38–126)
Bilirubin, Direct: 0.1 mg/dL (ref 0.0–0.2)
Indirect Bilirubin: 0.6 mg/dL (ref 0.3–0.9)
Total Bilirubin: 0.7 mg/dL (ref 0.3–1.2)
Total Protein: 6.7 g/dL (ref 6.5–8.1)

## 2023-06-22 LAB — LIPASE, BLOOD: Lipase: 29 U/L (ref 11–51)

## 2023-06-22 LAB — D-DIMER, QUANTITATIVE: D-Dimer, Quant: 0.29 ug{FEU}/mL (ref 0.00–0.50)

## 2023-06-22 MED ORDER — SODIUM CHLORIDE 0.9 % IV BOLUS
1000.0000 mL | Freq: Once | INTRAVENOUS | Status: AC
  Administered 2023-06-22: 1000 mL via INTRAVENOUS

## 2023-06-22 MED ORDER — MORPHINE SULFATE (PF) 4 MG/ML IV SOLN
4.0000 mg | Freq: Once | INTRAVENOUS | Status: AC
  Administered 2023-06-22: 4 mg via INTRAVENOUS
  Filled 2023-06-22: qty 1

## 2023-06-22 MED ORDER — ONDANSETRON HCL 4 MG/2ML IJ SOLN
4.0000 mg | Freq: Once | INTRAMUSCULAR | Status: AC
  Administered 2023-06-22: 4 mg via INTRAVENOUS
  Filled 2023-06-22: qty 2

## 2023-06-22 MED ORDER — KETOROLAC TROMETHAMINE 15 MG/ML IJ SOLN
15.0000 mg | Freq: Once | INTRAMUSCULAR | Status: AC
  Administered 2023-06-22: 15 mg via INTRAVENOUS
  Filled 2023-06-22: qty 1

## 2023-06-22 NOTE — Discharge Instructions (Signed)
You can take Tylenol 1 g every 8 hours, ibuprofen 600 every 6-8 hours to help with pain and return to the ER if develop chest pain, worsening symptoms or any other concerns.  IMPRESSION: 1. No acute finding by CT. No evidence of urinary tract stone disease. 2. Moderate amount of fecal matter in the colon.   Aortic Atherosclerosis (ICD10-I70.0).

## 2023-06-22 NOTE — Telephone Encounter (Signed)
Copied from CRM [id number]. Topic: General - Other >> Jun 22, 2023  3:52 PM Clide Dales wrote: Rulon Sera, NP with [facility] called to speak with patient's pcp about her increasing anxiety. Please follow up with Teah.

## 2023-06-22 NOTE — Telephone Encounter (Signed)
  Chief Complaint: left lower ribs to back pain Symptoms: sweating, nausea, SOB Frequency: last night  Disposition: [x] ED /[] Urgent Care (no appt availability in office) / [] Appointment(In office/virtual)/ []  [place] [facility]/ [] Home Care/ [] Refused Recommended Disposition /[] [place] [facility]/ []  Follow-up with PCP Additional Notes: - Reason for Disposition  [1] SEVERE back pain (e.g., excruciating) AND [2] sudden onset AND [3] age > 60 years  Answer Assessment - Initial Assessment Questions 1. ONSET: "When did the pain begin?"      Last night  2. LOCATION: "Where does it hurt?" (upper, mid or lower back)   Left lower Ribs radiating to back  3. SEVERITY: "How bad is the pain?"  (e.g., Scale 1-10; mild, moderate, or severe)   - MILD (1-3): Doesn't interfere with normal activities.    - MODERATE (4-7): Interferes with normal activities or awakens from sleep.    - SEVERE (8-10): Excruciating pain, unable to do any normal activities.      Severe  4. PATTERN: "Is the pain constant?" (e.g., yes, no; constant, intermittent)      yes  6. CAUSE:  "What do you think is causing the back pain?"      Pulled muscle   8. MEDICINES: "What have you taken so far for the pain?" (e.g., nothing, acetaminophen, NSAIDS)      10. OTHER SYMPTOMS: "Do you have any other symptoms?" (e.g., fever, abdomen pain, burning with urination, blood in urine)       SOB nausea, sweating,  Protocols used: Back Pain-A-AH

## 2023-06-22 NOTE — Telephone Encounter (Signed)
Noted  

## 2023-06-22 NOTE — ED Triage Notes (Signed)
Pt to ED for left flank pain started yesterday. +nausea. Denies urinary sx. Reports extensive GI problems and recent unexplained weight loss.

## 2023-06-22 NOTE — ED Provider Notes (Signed)
6:00 PM Assumed care for off going team.   Blood pressure (!) 164/86, pulse 80, temperature 98.4 F (36.9 C), resp. rate 16, height 5\' 4"  (1.626 m), weight 49.9 kg, SpO2 98%.  See their HPI for full report but in brief pending UA  Patient was handed off to me pending urine and CT imaging.  CT imaging was reassuring did show some signs of constipation.  Discussed this with patient and she states that how could this just be from constipation.  Explained to patient that could also be other things such as musculoskeletal but that we have ruled out some of the worst case scenarios.  Her UA looks negative for UTI.  Patient is adamant that she would like to leave at this time.  I tried to reexamine her and to see if there was anything else that could be causing her her pain she has got no midline tenderness no obvious erythema on the skin but at this time she states that she would just like to leave and does not want to discuss this any further.  Patient reports being frustrated with the length of time that she has been here and does not want any further evaluation therefore patient will be discharged at this time although workup thus far has been reassuring  I noticed  EKG was not done I tried to ask patient if she has had any chest pain recently but patient states that she just wants to leave and does not want any further testing done.  It sounds that the pain was more reproducible to the left flank pain but at this time patient is adamant that she would like her IV out and to be discharged home.  She has a family member at bedside who witnesses this and they are continuing to just ask for discharge instruction and dshe does not want to discuss further her medical to condition with me.       Concha Se, MD 06/22/23 [phone]

## 2023-06-22 NOTE — ED Provider Notes (Signed)
[facility] Provider Note    Event Date/Time   First MD Initiated Contact with Patient 06/22/23 1403     (approximate)   History   Flank Pain   HPI  Emily Johnston is a 62 y.o. female  here with left flank pain. Pt has a h/o COPD, RA, fibromyalgia, severe IBS, and is here with left flank pain. Pt reports that over the past 24 hours she has developed gradual onset of progressively worsening left flank pain. Reports it is sharp, stabbing, localized to the left flank and back and is associated with nausea, no vomiting. She has had no fever, chills. She has recently had worsening GI pain and started a new medication with GI for IBS, which does not seem to be helping.      Physical Exam   Triage Vital Signs: ED Triage Vitals  Encounter Vitals Group     BP 06/22/23 1204 (!) 158/92     Systolic BP Percentile --      Diastolic BP Percentile --      Pulse Rate 06/22/23 1203 80     Resp 06/22/23 1203 16     Temp 06/22/23 1203 98.4 F (36.9 C)     Temp src --      SpO2 06/22/23 1203 99 %     Weight 06/22/23 1203 110 lb (49.9 kg)     Height 06/22/23 1203 5\' 4"  (1.626 m)     Head Circumference --      Peak Flow --      Pain Score 06/22/23 1203 10     Pain Loc --      Pain Education --      Exclude from Growth Chart --     Most recent vital signs: Vitals:   06/22/23 1203 06/22/23 1204  BP:  (!) 158/92  Pulse: 80   Resp: 16   Temp: 98.4 F (36.9 C)   SpO2: 99%      General: Awake, no distress.  CV:  Good peripheral perfusion. RRR. Resp:  Normal work of breathing. Lungs clear bilaterally. Abd:  No distention. Moderate TTP over L flank, with no overt CVAT. No rebound or guarding.  Other:  No LE edema.    ED Results / Procedures / Treatments   Labs (all labs ordered are listed, but only abnormal results are displayed) Labs Reviewed  BASIC METABOLIC PANEL - Abnormal; Notable for the following components:      Result Value   Potassium  3.0 (*)    Glucose, Bld 115 (*)    All other components within normal limits  CBC  D-DIMER, QUANTITATIVE  URINALYSIS, ROUTINE W REFLEX MICROSCOPIC  LIPASE, BLOOD  HEPATIC FUNCTION PANEL     EKG    RADIOLOGY CXR: No acute abnormality CT Stone: Pending   I also independently reviewed and agree with radiologist interpretations.   PROCEDURES:  Critical Care performed: Yes, see critical care procedure note(s)   MEDICATIONS ORDERED IN ED: Medications  sodium chloride 0.9 % bolus 1,000 mL (has no administration in time range)  morphine (PF) 4 MG/ML injection 4 mg (4 mg Intravenous Given 06/22/23 1440)  ondansetron (ZOFRAN) injection 4 mg (4 mg Intravenous Given 06/22/23 1438)     IMPRESSION / MDM / ASSESSMENT AND PLAN / ED COURSE  I reviewed the triage vital signs and the nursing notes.  Differential diagnosis includes, but is not limited to, kidney stone, pyelo, IBS, MSK pain/thoracic radiculopathy, atypical shingles, colitis, enteritis, obstruction, ovarian cyst  Patient's presentation is most consistent with acute presentation with potential threat to life or bodily function.  The patient is on the cardiac monitor to evaluate for evidence of arrhythmia and/or significant heart rate changes  62 yo F here with left flank pain. Pt is HDS, well appearing, but in moderate pain. IV analgesia given, will check CT stone study and UA. CBC unremarkable - no leukocytosis or anemia. BMP with normal renal function. LFTs, Lipase are pending. D-Dimer sent given that her pain does involve the L lower lung fields/ribs, and is normal. Do not suspect PE. CXR is clear. DDx remains renal stone, pyelo, or possibly MSK rib pain/costochondritis.     FINAL CLINICAL IMPRESSION(S) / ED DIAGNOSES   Final diagnoses:  Left flank pain     Rx / DC Orders   ED Discharge Orders     None        Note:  This document was prepared using Dragon voice recognition  software and may include unintentional dictation errors.   Shaune Pollack, MD 06/22/23 [phone]

## 2023-06-22 NOTE — Telephone Encounter (Signed)
FYI to provider

## 2023-06-23 NOTE — Telephone Encounter (Signed)
Patient's brother Emily Johnston has called back, returning Brittany's call from yesterday & would like a return call on the phone # [phone].

## 2023-06-23 NOTE — Telephone Encounter (Signed)
No ma'am, I did not call him.

## 2023-06-24 ENCOUNTER — Ambulatory Visit: Payer: Self-pay

## 2023-06-24 NOTE — Telephone Encounter (Addendum)
     Chief Complaint: Seen in ED 06/22/23. Brother states "they couldn't find anything wrong, said she's constipated." Severe left flank pain. Vomited x 2 last night. "She can't get up and walk." Symptoms: Above Frequency: May Pertinent Negatives: Patient denies  Disposition: [x] ED /[] Urgent Care (no appt availability in office) / [] Appointment(In office/virtual)/ []  [facility]/ [] Home Care/ [] Refused Recommended Disposition /[] [facility]/ []  Follow-up with PCP Additional Notes: Brother will call 911 to take pt. To ED.  Reason for Disposition  [1] SEVERE pain (e.g., excruciating, scale 8-10) AND [2] present > 1 hour  Answer Assessment - Initial Assessment Questions 1. LOCATION: "Where does it hurt?" (e.g., left, right)     Left 2. ONSET: "When did the pain start?"     May 3. SEVERITY: "How bad is the pain?" (e.g., Scale 1-10; mild, moderate, or severe)   - MILD (1-3): doesn't interfere with normal activities    - MODERATE (4-7): interferes with normal activities or awakens from sleep    - SEVERE (8-10): excruciating pain and patient unable to do normal activities (stays in bed)       Severe 4. PATTERN: "Does the pain come and go, or is it constant?"      Constant 5. CAUSE: "What do you think is causing the pain?"     Unsure 6. OTHER SYMPTOMS:  "Do you have any other symptoms?" (e.g., fever, abdomen pain, vomiting, leg weakness, burning with urination, blood in urine)     Vomited last night, swollen area to flank 7. PREGNANCY:  "Is there any chance you are pregnant?" "When was your last menstrual period?"     No  Protocols used: Flank Pain-A-AH

## 2023-06-24 NOTE — Telephone Encounter (Signed)
Noted  

## 2023-06-25 NOTE — Telephone Encounter (Signed)
Attempted to call psychiatric NP again, voicemail left with [employer] message machine to call me at my number.  If calls please let me know.  Have tried to contact her a few times now, but do not have her actual #, only office number given.

## 2023-06-26 ENCOUNTER — Other Ambulatory Visit: Payer: Self-pay

## 2023-06-26 ENCOUNTER — Emergency Department
Admission: EM | Admit: 2023-06-26 | Discharge: 2023-06-26 | Disposition: A | Discharge status: Home / Self Care | Payer: 59 | Attending: Emergency Medicine | Admitting: Emergency Medicine

## 2023-06-26 DIAGNOSIS — Z79899 Other long term (current) drug therapy: Secondary | ICD-10-CM | POA: Insufficient documentation

## 2023-06-26 DIAGNOSIS — F419 Anxiety disorder, unspecified: Secondary | ICD-10-CM | POA: Diagnosis present

## 2023-06-26 DIAGNOSIS — F411 Generalized anxiety disorder: Secondary | ICD-10-CM | POA: Diagnosis not present

## 2023-06-26 DIAGNOSIS — J449 Chronic obstructive pulmonary disease, unspecified: Secondary | ICD-10-CM | POA: Diagnosis not present

## 2023-06-26 DIAGNOSIS — R109 Unspecified abdominal pain: Secondary | ICD-10-CM | POA: Diagnosis not present

## 2023-06-26 DIAGNOSIS — K589 Irritable bowel syndrome without diarrhea: Secondary | ICD-10-CM | POA: Insufficient documentation

## 2023-06-26 DIAGNOSIS — R0789 Other chest pain: Secondary | ICD-10-CM | POA: Diagnosis present

## 2023-06-26 DIAGNOSIS — F17210 Nicotine dependence, cigarettes, uncomplicated: Secondary | ICD-10-CM | POA: Diagnosis not present

## 2023-06-26 DIAGNOSIS — R079 Chest pain, unspecified: Secondary | ICD-10-CM | POA: Insufficient documentation

## 2023-06-26 DIAGNOSIS — F331 Major depressive disorder, recurrent, moderate: Secondary | ICD-10-CM | POA: Insufficient documentation

## 2023-06-26 LAB — CBC
HCT: 42.7 % (ref 36.0–46.0)
Hemoglobin: 14.5 g/dL (ref 12.0–15.0)
MCH: 31.5 pg (ref 26.0–34.0)
MCHC: 34 g/dL (ref 30.0–36.0)
MCV: 92.8 fL (ref 80.0–100.0)
Platelets: 223 10*3/uL (ref 150–400)
RBC: 4.6 MIL/uL (ref 3.87–5.11)
RDW: 13 % (ref 11.5–15.5)
WBC: 8.7 10*3/uL (ref 4.0–10.5)
nRBC: 0 % (ref 0.0–0.2)

## 2023-06-26 LAB — COMPREHENSIVE METABOLIC PANEL
ALT: 24 U/L (ref 0–44)
AST: 24 U/L (ref 15–41)
Albumin: 4 g/dL (ref 3.5–5.0)
Alkaline Phosphatase: 68 U/L (ref 38–126)
Anion gap: 13 (ref 5–15)
BUN: 16 mg/dL (ref 8–23)
CO2: 23 mmol/L (ref 22–32)
Calcium: 9.3 mg/dL (ref 8.9–10.3)
Chloride: 101 mmol/L (ref 98–111)
Creatinine, Ser: 0.79 mg/dL (ref 0.44–1.00)
GFR, Estimated: >60 mL/min (ref >60)
Glucose, Bld: 130 mg/dL — ABNORMAL HIGH (ref 70–99)
Potassium: 3.4 mmol/L — ABNORMAL LOW (ref 3.5–5.1)
Sodium: 137 mmol/L (ref 135–145)
Total Bilirubin: 0.7 mg/dL (ref 0.3–1.2)
Total Protein: 7.1 g/dL (ref 6.5–8.1)

## 2023-06-26 LAB — ACETAMINOPHEN LEVEL: Acetaminophen (Tylenol), Serum: <10 ug/mL — ABNORMAL LOW (ref 10–30)

## 2023-06-26 LAB — ETHANOL: Alcohol, Ethyl (B): <10 mg/dL (ref <10)

## 2023-06-26 LAB — LACTIC ACID, PLASMA: Lactic Acid, Venous: 0.7 mmol/L (ref 0.5–1.9)

## 2023-06-26 LAB — SALICYLATE LEVEL: Salicylate Lvl: <7 mg/dL — ABNORMAL LOW (ref 7.0–30.0)

## 2023-06-26 MED ORDER — LIDOCAINE VISCOUS HCL 2 % MT SOLN
15.0000 mL | Freq: Once | OROMUCOSAL | Status: DC
  Filled 2023-06-26: qty 15

## 2023-06-26 MED ORDER — ALUM & MAG HYDROXIDE-SIMETH 200-200-20 MG/5ML PO SUSP
30.0000 mL | Freq: Once | ORAL | Status: DC
  Filled 2023-06-26: qty 30

## 2023-06-26 MED ORDER — LORAZEPAM 1 MG PO TABS
1.0000 mg | ORAL_TABLET | Freq: Once | ORAL | Status: AC
  Administered 2023-06-26: 1 mg via ORAL
  Filled 2023-06-26: qty 1

## 2023-06-26 MED ORDER — HALOPERIDOL LACTATE 5 MG/ML IJ SOLN
5.0000 mg | Freq: Once | INTRAMUSCULAR | Status: AC
  Administered 2023-06-26: 5 mg via INTRAMUSCULAR
  Filled 2023-06-26: qty 1

## 2023-06-26 NOTE — Discharge Instructions (Signed)
Please seek medical attention for any high fevers, chest pain, shortness of breath, change in behavior, persistent vomiting, bloody stool or any other new or concerning symptoms.  

## 2023-06-26 NOTE — ED Triage Notes (Signed)
Pt reports sent by psychiatrist for possible admission d/t anxiety and depressed mood. Reports is having left rib pain continued from being seen a few days ago that doctors believe is coming from anxiety and depression. Pt tearful in triage. States "I'm just tired". Denies SI/HI

## 2023-06-26 NOTE — ED Provider Notes (Signed)
[facility] Provider Note    Event Date/Time   First MD Initiated Contact with Patient 06/26/23 1812     (approximate)   History   Abdominal pain   HPI  Emily Johnston is a 62 y.o. female   who presents to the emergency department today at the advice of her gastroenterologist.  Per note dated 8 yesterday there is concern for dehydration and pain management.  They also had some concerns for mental health.  Patient states that she has been having issues with abdominal pain since obtaining a colonoscopy earlier this year.  This has led to decreased oral intake and weight loss.  She did see her gastroenterologist yesterday.  The patient states the pain is located in her left lower chest.  Per chart review the patient was also seen 4 days ago here in the emergency department with concerns for left sided abdominal pain.  During that visit she had a CT scan which did not show any acute abdominal abnormalities.      Physical Exam   Triage Vital Signs: ED Triage Vitals  Encounter Vitals Group     BP 06/26/23 1110 (!) 123/99     Systolic BP Percentile --      Diastolic BP Percentile --      Pulse Rate 06/26/23 1110 89     Resp 06/26/23 1110 18     Temp 06/26/23 1110 97.9 F (36.6 C)     Temp src --      SpO2 06/26/23 1110 95 %     Weight 06/26/23 1111 108 lb (49 kg)     Height 06/26/23 1111 5\' 4"  (1.626 m)     Head Circumference --      Peak Flow --      Pain Score 06/26/23 1111 10     Pain Loc --      Pain Education --      Exclude from Growth Chart --     Most recent vital signs: Vitals:   06/26/23 1110  BP: (!) 123/99  Pulse: 89  Resp: 18  Temp: 97.9 F (36.6 C)  SpO2: 95%   General: Awake, alert, oriented. CV:  Good peripheral perfusion. Resp:  Normal effort.  Abd:  No distention.   ED Results / Procedures / Treatments   Labs (all labs ordered are listed, but only abnormal results are displayed) Labs Reviewed  COMPREHENSIVE  METABOLIC PANEL - Abnormal; Notable for the following components:      Result Value   Potassium 3.4 (*)    Glucose, Bld 130 (*)    All other components within normal limits  SALICYLATE LEVEL - Abnormal; Notable for the following components:   Salicylate Lvl <7.0 (*)    All other components within normal limits  ACETAMINOPHEN LEVEL - Abnormal; Notable for the following components:   Acetaminophen (Tylenol), Serum <10 (*)    All other components within normal limits  ETHANOL  CBC  URINE DRUG SCREEN, QUALITATIVE ([facility] ONLY)     EKG  None   RADIOLOGY None   PROCEDURES:  Critical Care performed: No    MEDICATIONS ORDERED IN ED: Medications - No data to display   IMPRESSION / MDM / ASSESSMENT AND PLAN / ED COURSE  I reviewed the triage vital signs and the nursing notes.  Differential diagnosis includes, but is not limited to, anxiety, depression, IBS  Patient's presentation is most consistent with acute presentation with potential threat to life or bodily function.  Patient presents to the emergency department today at the advice of her GI provider because of concerns for dehydration, pain management as well as psychiatric illness.  On exam patient is quite anxious.  Blood work without any significant electrolyte abnormalities that would suggest significant dehydration.  Will give patient medication to help with abdominal pain.  Will have psychiatry evaluate.  Psychiatry evaluated the patient.  At this time they did not feel she necessitates inpatient admission.  Patient was requesting discharge and I think this is safe and reasonable.  Patient can continue workup as outpatient.   FINAL CLINICAL IMPRESSION(S) / ED DIAGNOSES   Final diagnoses:  Anxiety   Note:  This document was prepared using Dragon voice recognition software and may include unintentional dictation errors.    Phineas Semen, MD 06/26/23 2228

## 2023-06-26 NOTE — ED Notes (Signed)
Pt refused evening snack, pt ask for water, provided.

## 2023-06-26 NOTE — Consult Note (Signed)
Telepsych Consultation   Reason for Consult:  Psych Evaluation  Referring Physician:  Dr. Derrill Kay Location of Patient: [facility] ER Location of Provider: Behavioral Health TTS Department  Patient Identification: Emily Johnston MRN:  [id number] Principal Diagnosis: Anxiety state Diagnosis:  Principal Problem:   Anxiety state   Total Time spent with patient: 30 minutes  Subjective:  " My GI doctor told me to come here to get admitted for my pain, dehydration, and anxiety"   HPI:  Tele psych Assessment  Emily Johnston, 62 y.o., female patient seen via tele health by TTS and this provider; chart reviewed and consulted with Dr. Adolphus Birchwood on 06/26/23.  On evaluation Emily Johnston reports she was having GI issues.  She say she was diagnosed with IBS.  Since her diagnosis, her anxiety has been extreme. She reports having a great psychiatrist and a great therapist and do not need psych intervention tonight. She says that her GI doctor recommended admission to the medical floor for dehydration and pain and not behavioral health, "I will not be admitted to the looney bin".  This provider secure chatted Dr. Derrill Kay and he states that her labs are not reflective of dehydration and that he feels her pain in more related to her mental health.  She denies SI stating that she would never hurt herself and denies HI.  She also denies AVH.  She endorses poor appetite related to anxiety and states it is difficult for her to keep food down.  She is currently prescribed Klonopin 1mg  daily by her provider and stated that her provider was supposed to increase the dose but does not think he did.   Ativan 1mg  was prescribed by this provider at the time of assessment.  During evaluation Emily Johnston is sitting in the assessment chair; she is alert/oriented x 4; anxious/cooperative; and mood congruent with affect.  Patient is speaking in a clear tone at moderate volume, and normal pace; with good eye  contact. She reports being desperate for help.  She states, "I don't wanna die".  Sh feels as though her IBS diagnosis and anxiety is going to lead to further health problems.  Her thought process is coherent and relevant; There is no indication that she is currently responding to internal/external stimuli or experiencing delusional thought content.  Patient denies suicidal/self-harm/homicidal ideation, psychosis, and paranoia.   has answered questions appropriately.    Disposition: Patient is psychiatrically cleared  Dr. Derrill Kay informed of above recommendation and disposition  Past Psychiatric History: Anxiety, depression  Risk to Self:  no  Risk to Others:  no Prior Inpatient Therapy:  no Prior Outpatient Therapy:  yes  Past Medical History:  Past Medical History:  Diagnosis Date   Anxiety    Arthritis    COPD (chronic obstructive pulmonary disease) (HCC)    Degenerative disc disease, cervical    Depression    Fibromyalgia    GERD (gastroesophageal reflux disease)    HLD (hyperlipidemia)    IBS (irritable bowel syndrome)    Psoriasis    PTSD (post-traumatic stress disorder)     Past Surgical History:  Procedure Laterality Date   CARPAL TUNNEL RELEASE     COLONOSCOPY WITH PROPOFOL N/A 02/06/2023   Procedure: COLONOSCOPY WITH PROPOFOL;  Surgeon: Wyline Mood, MD;  Location: [facility] ENDOSCOPY;  Service: Endoscopy;  Laterality: N/A;   ESOPHAGOGASTRODUODENOSCOPY (EGD) WITH PROPOFOL N/A 02/06/2023   Procedure: ESOPHAGOGASTRODUODENOSCOPY (EGD) WITH PROPOFOL;  Surgeon: Wyline Mood, MD;  Location: [facility] ENDOSCOPY;  Service: Endoscopy;  Laterality: N/A;   KNEE SURGERY     NECK SURGERY     TOTAL ABDOMINAL HYSTERECTOMY     Family History:  Family History  Problem Relation Age of Onset   Breast cancer Mother 49   Family Psychiatric  History: unknown Social History:  Social History   Substance and Sexual Activity  Alcohol Use No     Social History   Substance and Sexual Activity   Drug Use No    Social History   Socioeconomic History   Marital status: Legally Separated    Spouse name: Not on file   Number of children: Not on file   Years of education: Not on file   Highest education level: Not on file  Occupational History   Not on file  Tobacco Use   Smoking status: Every Day    Current packs/day: 1.00    Average packs/day: 1 pack/day for 41.0 years (41.0 ttl pk-yrs)    Types: Cigarettes   Smokeless tobacco: Never   Tobacco comments:    1 PPD - 12/23/2022 khj  Vaping Use   Vaping status: Never Used  Substance and Sexual Activity   Alcohol use: No   Drug use: No   Sexual activity: Not on file  Other Topics Concern   Not on file  Social History Narrative   Not on file   Social Determinants of Health   Financial Resource Strain: Low Risk  (11/13/2022)   Overall Financial Resource Strain (CARDIA)    Difficulty of Paying Living Expenses: Not hard at all  Food Insecurity: No Food Insecurity (06/09/2023)   Hunger Vital Sign    Worried About Running Out of Food in the Last Year: Never true    Ran Out of Food in the Last Year: Never true  Transportation Needs: No Transportation Needs (06/09/2023)   PRAPARE - Administrator, Civil Service (Medical): No    Lack of Transportation (Non-Medical): No  Physical Activity: Insufficiently Active (11/13/2022)   Exercise Vital Sign    Days of Exercise per Week: 2 days    Minutes of Exercise per Session: 20 min  Stress: Stress Concern Present (11/13/2022)   [employer] of Occupational Health - Occupational Stress Questionnaire    Feeling of Stress : Rather much  Social Connections: Socially Isolated (11/13/2022)   Social Connection and Isolation Panel [NHANES]    Frequency of Communication with Friends and Family: More than three times a week    Frequency of Social Gatherings with Friends and Family: More than three times a week    Attends Religious Services: Never    Database administrator or  Organizations: No    Attends Banker Meetings: Never    Marital Status: Never married   Additional Social History:    Allergies:  No Known Allergies  Labs:  Results for orders placed or performed during the hospital encounter of 06/26/23 (from the past 48 hour(s))  Comprehensive metabolic panel     Status: Abnormal   Collection Time: 06/26/23 11:13 AM  Result Value Ref Range   Sodium 137 135 - 145 mmol/L   Potassium 3.4 (L) 3.5 - 5.1 mmol/L   Chloride 101 98 - 111 mmol/L   CO2 23 22 - 32 mmol/L   Glucose, Bld 130 (H) 70 - 99 mg/dL    Comment: Glucose reference range applies only to samples taken after fasting for at least 8 hours.   BUN 16 8 - 23 mg/dL  Creatinine, Ser 0.79 0.44 - 1.00 mg/dL   Calcium 9.3 8.9 - 40.9 mg/dL   Total Protein 7.1 6.5 - 8.1 g/dL   Albumin 4.0 3.5 - 5.0 g/dL   AST 24 15 - 41 U/L   ALT 24 0 - 44 U/L   Alkaline Phosphatase 68 38 - 126 U/L   Total Bilirubin 0.7 0.3 - 1.2 mg/dL   GFR, Estimated >81 >19 mL/min    Comment: (NOTE) Calculated using the CKD-EPI Creatinine Equation (2021)    Anion gap 13 5 - 15    Comment: Performed at [facility], [address] [place], [place] [address]  Ethanol     Status: None   Collection Time: 06/26/23 11:13 AM  Result Value Ref Range   Alcohol, Ethyl (B) <10 <10 mg/dL    Comment: (NOTE) Lowest detectable limit for serum alcohol is 10 mg/dL.  For medical purposes only. Performed at [facility], [address] [place], [place] [address]   Salicylate level     Status: Abnormal   Collection Time: 06/26/23 11:13 AM  Result Value Ref Range   Salicylate Lvl <7.0 (L) 7.0 - 30.0 mg/dL    Comment: Performed at [facility], [address] [place], [place] [address]  Acetaminophen level     Status: Abnormal   Collection Time: 06/26/23 11:13 AM  Result Value Ref Range   Acetaminophen (Tylenol), Serum <10 (L) 10 - 30 ug/mL    Comment: (NOTE) Therapeutic concentrations  vary significantly. A range of 10-30 ug/mL  may be an effective concentration for many patients. However, some  are best treated at concentrations outside of this range. Acetaminophen concentrations >150 ug/mL at 4 hours after ingestion  and >50 ug/mL at 12 hours after ingestion are often associated with  toxic reactions.  Performed at [facility], [address] [place], [place] [address]   cbc     Status: None   Collection Time: 06/26/23 11:13 AM  Result Value Ref Range   WBC 8.7 4.0 - 10.5 K/uL   RBC 4.60 3.87 - 5.11 MIL/uL   Hemoglobin 14.5 12.0 - 15.0 g/dL   HCT 62.9 52.8 - 41.3 %   MCV 92.8 80.0 - 100.0 fL   MCH 31.5 26.0 - 34.0 pg   MCHC 34.0 30.0 - 36.0 g/dL   RDW 24.4 01.0 - 27.2 %   Platelets 223 150 - 400 K/uL   nRBC 0.0 0.0 - 0.2 %    Comment: Performed at [facility], [address] [place], [place] [address]  Lactic acid, plasma     Status: None   Collection Time: 06/26/23  8:42 PM  Result Value Ref Range   Lactic Acid, Venous 0.7 0.5 - 1.9 mmol/L    Comment: Performed at [facility], [address] [place], [place] [address]    Medications:  Current Facility-Administered Medications  Medication Dose Route Frequency Provider Last Rate Last Admin   alum & mag hydroxide-simeth (MAALOX/MYLANTA) 200-200-20 MG/5ML suspension 30 mL  30 mL Oral Once Phineas Semen, MD       And   lidocaine (XYLOCAINE) 2 % viscous mouth solution 15 mL  15 mL Oral Once Phineas Semen, MD       LORazepam (ATIVAN) tablet 1 mg  1 mg Oral Once Jearld Lesch, NP       Current Outpatient Medications  Medication Sig Dispense Refill   albuterol (VENTOLIN HFA) 108 (90 Base) MCG/ACT inhaler Inhale 2 puffs  into the lungs every 6 (six) hours as needed for wheezing or shortness of breath. 1 each 3   Cholecalciferol 1.25 MG (50000 UT) TABS Take 1 tablet by mouth once a week. 12 tablet 4   clobetasol ointment (TEMOVATE) 0.05 % Apply 1 application  topically  2 (two) times daily.     clonazePAM (KLONOPIN) 1 MG tablet Take 1 mg by mouth daily as needed.     doxepin (SINEQUAN) 100 MG capsule Take 100 mg by mouth at bedtime.     FLUoxetine (PROZAC) 40 MG capsule Take 40 mg by mouth daily.     fluticasone (FLONASE) 50 MCG/ACT nasal spray SPRAY 2 SPRAYS INTO EACH NOSTRIL EVERY DAY 48 mL 2   Fluticasone-Umeclidin-Vilant (TRELEGY ELLIPTA) 200-62.5-25 MCG/ACT AEPB Inhale 1 puff into the lungs daily. 60 each 11   folic acid (FOLVITE) 1 MG tablet Take 1 mg by mouth daily.     hydrocortisone 1 % ointment Apply 1 Application topically 2 (two) times daily. 30 g 0   meclizine (ANTIVERT) 12.5 MG tablet Take 1 tablet (12.5 mg total) by mouth 3 (three) times daily as needed for dizziness. 60 tablet 1   methimazole (TAPAZOLE) 5 MG tablet Take 5 mg by mouth every other day.     montelukast (SINGULAIR) 10 MG tablet Take 1 tablet (10 mg total) by mouth at bedtime. 90 tablet 4   Multiple Vitamin (MULTIVITAMIN) tablet Take 1 tablet by mouth daily.     ondansetron (ZOFRAN) 4 MG tablet Take 1 tablet (4 mg total) by mouth every 8 (eight) hours as needed for nausea or vomiting. 90 tablet 12   OTEZLA 30 MG TABS Take 1 tablet by mouth 2 (two) times daily.     pantoprazole (PROTONIX) 40 MG tablet Take 40 mg by mouth daily.     predniSONE (DELTASONE) 10 MG tablet Take 6 tablets by mouth daily for 2 days, then reduce by 1 tablet every 2 days until gone 42 tablet 0   rosuvastatin (CRESTOR) 10 MG tablet Take 1 tablet (10 mg total) by mouth daily. 30 tablet 6   tacrolimus (PROTOPIC) 0.1 % ointment 2 (two) times daily.     VIBERZI 100 MG TABS Take 100 mg by mouth 2 (two) times daily.      Musculoskeletal: Strength & Muscle Tone: within normal limits Gait & Station: normal Patient leans: N/A   Psychiatric Specialty Exam:  Presentation  General Appearance: Appropriate for Environment; Casual  Eye Contact:Fair  Speech:Clear and Coherent  Speech  Volume:Normal  Handedness:Right   Mood and Affect  Mood:Anxious  Affect:Congruent   Thought Process  Thought Processes:Coherent  Descriptions of Associations:Intact  Orientation:Full (Time, Place and Person)  Thought Content:WDL; Logical  History of Schizophrenia/Schizoaffective disorder:No data recorded Duration of Psychotic Symptoms:No data recorded Hallucinations:Hallucinations: None  Ideas of Reference:None  Suicidal Thoughts:Suicidal Thoughts: No  Homicidal Thoughts: no  Sensorium  Memory:Immediate Good; Remote Good  Judgment:Fair  Insight:Fair   Executive Functions  Concentration:Fair  Attention Span:Fair  Recall:Fair  Fund of Knowledge:Fair  Language:Fair   Psychomotor Activity  Psychomotor Activity:Psychomotor Activity: Normal   Assets  Assets:Communication Skills; Desire for Improvement; Financial Resources/Insurance; Housing; Physical Health; Social Support   Sleep  Sleep:Sleep: Fair    Physical Exam: Physical Exam Vitals and nursing note reviewed.  Constitutional:      Appearance: Normal appearance.  HENT:     Head: Normocephalic and atraumatic.     Nose: Nose normal.     Mouth/Throat:     Mouth:  Mucous membranes are moist.  Eyes:     Pupils: Pupils are equal, round, and reactive to light.  Pulmonary:     Effort: Pulmonary effort is normal.  Musculoskeletal:        General: Normal range of motion.     Cervical back: Normal range of motion.  Neurological:     Mental Status: She is alert and oriented to person, place, and time.  Psychiatric:        Attention and Perception: Attention and perception normal.        Mood and Affect: Affect normal. Mood is anxious.        Speech: Speech normal.        Behavior: Behavior normal. Behavior is cooperative.        Thought Content: Thought content does not include homicidal or suicidal ideation. Thought content does not include homicidal or suicidal plan.        Cognition and  Memory: Cognition and memory normal.        Judgment: Judgment normal.    Review of Systems  Psychiatric/Behavioral:  Negative for depression, hallucinations, substance abuse and suicidal ideas. The patient is nervous/anxious and has insomnia.   All other systems reviewed and are negative.  Blood pressure (!) 152/89, pulse (!) 49, temperature 98.2 F (36.8 C), temperature source Oral, resp. rate 16, height 5\' 4"  (1.626 m), weight 49 kg, SpO2 93%. Body mass index is 18.54 kg/m.  Disposition: No evidence of imminent risk to self or others at present.   Patient does not meet criteria for psychiatric inpatient admission. Discussed crisis plan, support from social network, calling 911, coming to the Emergency Department, and calling Suicide Hotline.  This service was provided via telemedicine using a 2-way, interactive audio and video technology.  Jearld Lesch, NP 06/26/2023 9:59 PM

## 2023-06-26 NOTE — ED Notes (Addendum)
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 Belongings include: Latricia Heft Blue top [employer] pajama pants [employer] Black purse [employer]  Belongings sent with brother.

## 2023-06-30 ENCOUNTER — Ambulatory Visit: Payer: 59 | Admitting: Nurse Practitioner

## 2023-06-30 DIAGNOSIS — L4050 Arthropathic psoriasis, unspecified: Secondary | ICD-10-CM

## 2023-06-30 DIAGNOSIS — J432 Centrilobular emphysema: Secondary | ICD-10-CM

## 2023-06-30 DIAGNOSIS — E7800 Pure hypercholesterolemia, unspecified: Secondary | ICD-10-CM

## 2023-06-30 DIAGNOSIS — R634 Abnormal weight loss: Secondary | ICD-10-CM

## 2023-06-30 DIAGNOSIS — F4310 Post-traumatic stress disorder, unspecified: Secondary | ICD-10-CM

## 2023-06-30 DIAGNOSIS — F332 Major depressive disorder, recurrent severe without psychotic features: Secondary | ICD-10-CM

## 2023-06-30 DIAGNOSIS — E0590 Thyrotoxicosis, unspecified without thyrotoxic crisis or storm: Secondary | ICD-10-CM

## 2023-07-04 NOTE — Patient Instructions (Signed)
COPD and Physical Activity Chronic obstructive pulmonary disease (COPD) is a long-term, or chronic, condition that affects the lungs. COPD is a general term that can be used to describe many problems that cause inflammation of the lungs and limit airflow. These conditions include chronic bronchitis and emphysema. The main symptom of COPD is shortness of breath, which makes it harder to do even simple tasks. This can also make it harder to exercise and stay active. Talk with your health care provider about treatments to help you breathe better and actions you can take to prevent breathing problems during physical activity. What are the benefits of exercising when you have COPD? Exercising regularly is an important part of a healthy lifestyle. You can still exercise and do physical activities even though you have COPD. Exercise and physical activity improve your shortness of breath by increasing blood flow (circulation). This causes your heart to pump more oxygen through your body. Moderate exercise can: Improve oxygen use. Increase your energy level. Help with shortness of breath. Strengthen your breathing muscles. Improve heart health. Help with sleep. Improve your self-esteem and feelings of self-worth. Lower depression, stress, and anxiety. Exercise can benefit everyone with COPD. The severity of your disease may affect how hard you can exercise, especially at first, but everyone can benefit. Talk with your health care provider about how much exercise is safe for you, and which activities and exercises are safe for you. What actions can I take to prevent breathing problems during physical activity? Sign up for a pulmonary rehabilitation program. This type of program may include: Education about lung diseases. Exercise classes that teach you how to exercise and be more active while improving your breathing. This usually involves: Exercise using your lower extremities, such as a stationary  bicycle. About 30 minutes of exercise, 2 to 5 times per week, for 6 to 12 weeks. Strength training, such as push-ups or leg lifts. Nutrition education. Group classes in which you can talk with others who also have COPD and learn ways to manage stress. If you use an oxygen tank, you should use it while you exercise. Work with your health care provider to adjust your oxygen for your physical activity. Your resting flow rate is different from your flow rate during physical activity. How to manage your breathing while exercising While you are exercising: Take slow breaths. Pace yourself, and do nottry to go too fast. Purse your lips while breathing out. Pursing your lips is similar to a kissing or whistling position. If doing exercise that uses a quick burst of effort, such as weight lifting: Breathe in before starting the exercise. Breathe out during the hardest part of the exercise, such as raising the weights. Where to find support You can find support for exercising with COPD from: Your health care provider. A pulmonary rehabilitation program. Your local health department or community health programs. Support groups, either online or in-person. Your health care provider may be able to recommend support groups. Where to find more information You can find more information about exercising with COPD from: American Lung Association: lung.org COPD Foundation: copdfoundation.org Contact a health care provider if: Your symptoms get worse. You have nausea. You have a fever. You want to start a new exercise program or a new activity. Get help right away if: You have chest pain. You cannot breathe. These symptoms may represent a serious problem that is an emergency. Do not wait to see if the symptoms will go away. Get medical help right away. Call  your local emergency services (911 in the [place] Do not drive yourself to the hospital. Summary COPD is a general term that can be used to describe  many different lung problems that cause lung inflammation and limit airflow. This includes chronic bronchitis and emphysema. Exercise and physical activity improve your shortness of breath by increasing blood flow (circulation). This causes your heart to provide more oxygen to your body. Contact your health care provider before starting any exercise program or new activity. Ask your health care provider what exercises and activities are safe for you. This information is not intended to replace advice given to you by your health care provider. Make sure you discuss any questions you have with your health care provider. Document Revised: 07/10/2020 Document Reviewed: 07/10/2020 Elsevier Patient Education  2024 [employer].

## 2023-07-06 ENCOUNTER — Ambulatory Visit (INDEPENDENT_AMBULATORY_CARE_PROVIDER_SITE_OTHER): Payer: 59 | Admitting: Nurse Practitioner

## 2023-07-06 ENCOUNTER — Encounter: Payer: Self-pay | Admitting: Nurse Practitioner

## 2023-07-06 VITALS — BP 100/67 | HR 97 | Temp 98.1°F | Wt 107.6 lb

## 2023-07-06 DIAGNOSIS — F17219 Nicotine dependence, cigarettes, with unspecified nicotine-induced disorders: Secondary | ICD-10-CM

## 2023-07-06 DIAGNOSIS — M797 Fibromyalgia: Secondary | ICD-10-CM

## 2023-07-06 DIAGNOSIS — I700 Atherosclerosis of aorta: Secondary | ICD-10-CM | POA: Diagnosis not present

## 2023-07-06 DIAGNOSIS — E7800 Pure hypercholesterolemia, unspecified: Secondary | ICD-10-CM

## 2023-07-06 DIAGNOSIS — E0590 Thyrotoxicosis, unspecified without thyrotoxic crisis or storm: Secondary | ICD-10-CM

## 2023-07-06 DIAGNOSIS — J432 Centrilobular emphysema: Secondary | ICD-10-CM

## 2023-07-06 DIAGNOSIS — Z23 Encounter for immunization: Secondary | ICD-10-CM

## 2023-07-06 DIAGNOSIS — L4050 Arthropathic psoriasis, unspecified: Secondary | ICD-10-CM | POA: Diagnosis not present

## 2023-07-06 DIAGNOSIS — F332 Major depressive disorder, recurrent severe without psychotic features: Secondary | ICD-10-CM | POA: Diagnosis not present

## 2023-07-06 DIAGNOSIS — E782 Mixed hyperlipidemia: Secondary | ICD-10-CM

## 2023-07-06 DIAGNOSIS — R634 Abnormal weight loss: Secondary | ICD-10-CM

## 2023-07-06 DIAGNOSIS — F4310 Post-traumatic stress disorder, unspecified: Secondary | ICD-10-CM

## 2023-07-06 NOTE — Assessment & Plan Note (Signed)
Chronic, ongoing.  Continue Duloxetine for mood and pain, adjust medications as needed.   

## 2023-07-06 NOTE — Assessment & Plan Note (Signed)
Ongoing issue, continues to lose weight.  Has had work-up with GI, oncology, and imaging ordered by PCP - all has been reassuring.  She reports GI suspects she has severe IBS-D.  Suspect much of this is related to her anxiety and thyroid issues, which discussed with her today.  Educated her on anxiety and the effect on gut health.  Her anxiety levels continue to be elevated, this has been ongoing since loss of her mother to cancer. Continue Ensure.  May benefit from Mirtazapine in future, will see if psychiatry adjusts regimen in anyway.

## 2023-07-06 NOTE — Assessment & Plan Note (Addendum)
Chronic, ongoing.  Discussed at length with patient and recommended complete cessation of smoking and educated patient on risk with smoking. Patient refuses at this time. Continue statin daily.

## 2023-07-06 NOTE — Assessment & Plan Note (Signed)
I have recommended complete cessation of tobacco use. I have discussed various options available for assistance with tobacco cessation including over the counter methods (Nicotine gum, patch and lozenges). We also discussed prescription options (Chantix, Nicotine Inhaler / Nasal Spray). The patient is not interested in pursuing any prescription tobacco cessation options at this time.  

## 2023-07-06 NOTE — Assessment & Plan Note (Signed)
Chronic, ongoing.  Continue Crestor, is tolerating.  Lipid panel today.

## 2023-07-06 NOTE — Assessment & Plan Note (Signed)
Ongoing with continued symptoms, has visit this week with Endo.  Will review notes once available.  Continue current medication as ordered by them.

## 2023-07-06 NOTE — Progress Notes (Signed)
BP 100/67   Pulse 97   Temp 98.1 F (36.7 C) (Oral)   Wt 107 lb 9.6 oz (48.8 kg)   SpO2 97%   BMI 18.47 kg/m    Subjective:    Patient ID: Emily Johnston, female    DOB: February 19, 1961, 62 y.o.   MRN: [id number]  HPI: Emily Johnston is a 62 y.o. female  Chief Complaint  Patient presents with   Depression   Hyperlipidemia   COPD   Headache    Patient states she has been having daily headaches for the last few weeks. States tylenol is not helping the pain at all.    Rheumatoid Arthritis   COPD Continues to smoke -- smoking 1 PPD, has been smoking since age 31. Continues on Trelegy daily and Albuterol as needed.     Last exacerbation on 06/17/23 treated with Augmentin and Prednisone. COPD status: stable Satisfied with current treatment?: yes Oxygen use: no Dyspnea frequency: none Cough frequency: none Rescue inhaler frequency: rarely - once a month Limitation of activity: no Productive cough: none Last Spirometry: 05/12/22 = FEV1 66%, FEV1/FVC 99% Pneumovax: Up to Date Influenza: Up to Date   HYPERLIPIDEMIA Taking Rosuvastatin daily.  Reports a daily headache for last few weeks, took three Tylenol without benefit.  States occasional brain fog being present. Hyperlipidemia status: good compliance Satisfied with current treatment?  yes Side effects:  no Medication compliance: good compliance Supplements: none Aspirin:  no The 10-year ASCVD risk score (Arnett DK, et al., 2019) is: 5.4%   Values used to calculate the score:     Age: 29 years     Sex: Female     Is Non-Hispanic African American: No     Diabetic: No     Tobacco smoker: Yes     Systolic Blood Pressure: 100 mmHg     Is BP treated: No     HDL Cholesterol: 50 mg/dL     Total Cholesterol: 224 mg/dL Chest pain:  no Coronary artery disease:  no Family history CAD:  yes Family history early CAD:  no   HYPERTHYROIDISM Taking Methimazole, sees endocrinology on Thursday.   Fatigue: no Cold  intolerance: no Heat intolerance: no Weight gain: no Weight loss: yes Constipation: no Diarrhea/loose stools: yes Palpitations: no Lower extremity edema: no Anxiety/depressed mood: yes    PSORIATIC ARTHRITIS & FIBROMYALGIA: Followed by rheumatology at [facility], last seen 06/19/23 and dermatology last seen 2-3 weeks.  Was given a Prednisone long taper (she has not started) and they are working on getting Remicade infusions covered.  She continues on Methotrexate, Humira, folic acid.  Currently taking [place] once a day. Pain status: stable Satisfied with current treatment?: yes Previous pain specialty evaluation: no Non-narcotic analgesic meds: yes Narcotic contract:no Treatments attempted: rest, APAP, ibuprofen and physical therapy     DEPRESSION/PTSD Followed by psychiatry at [facility].  Is taking Klonopin (increased to 3 times a day by psych recently), Prozac. History of low Vitamin D, no current supplement. Mood status: stable Satisfied with current treatment?: yes Symptom severity: moderate Duration of current treatment : chronic Side effects: no Medication compliance: good compliance Psychotherapy/counseling: yes in past Depressed mood: yes Anxious mood: yes Anhedonia: no Significant weight loss or gain: no Insomnia: yes hard to stay asleep Fatigue: no Feelings of worthlessness or guilt: no Impaired concentration/indecisiveness: yes Suicidal ideations: no Hopelessness: no Crying spells: no    07/06/2023    2:12 PM 06/17/2023    3:29 PM 06/09/2023  12:06 PM 05/04/2023    3:43 PM 03/12/2023   11:28 AM  Depression screen PHQ 2/9  Decreased Interest 3 3 3 3 1   Down, Depressed, Hopeless 2 3 3 3 2   PHQ - 2 Score 5 6 6 6 3   Altered sleeping 2 2  2 3   Tired, decreased energy 3 3  3 2   Change in appetite 3 3  3 3   Feeling bad or failure about yourself  1 2  1 3   Trouble concentrating 3 3  2 1   Moving slowly or fidgety/restless 2 2  2 1   Suicidal thoughts 0 0  0 0   PHQ-9 Score 19 21  19 16   Difficult doing work/chores Very difficult Very difficult  Very difficult Somewhat difficult       07/06/2023    2:13 PM 06/17/2023    3:30 PM 05/04/2023    3:44 PM 03/12/2023   11:28 AM  GAD 7 : Generalized Anxiety Score  Nervous, Anxious, on Edge 3 3 3 1   Control/stop worrying 3 3 3 1   Worry too much - different things 3 3 3 1   Trouble relaxing 3 3 2 1   Restless 2 2 1  0  Easily annoyed or irritable 2 3 1 2   Afraid - awful might happen 2 1 2 3   Total GAD 7 Score 18 18 15 9   Anxiety Difficulty Very difficult Very difficult Very difficult Somewhat difficult   Relevant past medical, surgical, family and social history reviewed and updated as indicated. Interim medical history since our last visit reviewed. Allergies and medications reviewed and updated.  Review of Systems  Constitutional:  Negative for activity change, appetite change, diaphoresis, fatigue and fever.  Respiratory:  Negative for cough, chest tightness and shortness of breath.   Cardiovascular:  Negative for chest pain, palpitations and leg swelling.  Gastrointestinal: Negative.   Endocrine: Negative for cold intolerance and heat intolerance.  Musculoskeletal:  Positive for arthralgias.  Neurological: Negative.   Psychiatric/Behavioral:  Positive for sleep disturbance. Negative for decreased concentration, self-injury and suicidal ideas. The patient is nervous/anxious.     Per HPI unless specifically indicated above     Objective:    BP 100/67   Pulse 97   Temp 98.1 F (36.7 C) (Oral)   Wt 107 lb 9.6 oz (48.8 kg)   SpO2 97%   BMI 18.47 kg/m   Wt Readings from Last 3 Encounters:  07/06/23 107 lb 9.6 oz (48.8 kg)  06/26/23 108 lb (49 kg)  06/22/23 110 lb (49.9 kg)    Physical Exam Vitals and nursing note reviewed.  Constitutional:      General: She is awake. She is not in acute distress.    Appearance: Normal appearance. She is well-developed, well-groomed and underweight.  She is not ill-appearing or toxic-appearing.  HENT:     Head: Normocephalic.     Right Ear: Hearing and external ear normal. No middle ear effusion. There is no impacted cerumen. Tympanic membrane is not injected.     Left Ear: Hearing and external ear normal.  No middle ear effusion. There is no impacted cerumen. Tympanic membrane is not injected.  Eyes:     General: Lids are normal.        Right eye: No discharge.        Left eye: No discharge.     Conjunctiva/sclera: Conjunctivae normal.     Pupils: Pupils are equal, round, and reactive to light.  Neck:  Thyroid: No thyromegaly.     Vascular: No carotid bruit.  Cardiovascular:     Rate and Rhythm: Normal rate and regular rhythm.     Heart sounds: Normal heart sounds. No murmur heard.    No gallop.  Pulmonary:     Effort: Pulmonary effort is normal. No accessory muscle usage or respiratory distress.     Breath sounds: Normal breath sounds. No decreased breath sounds, wheezing or rhonchi.  Abdominal:     General: Bowel sounds are normal. There is no distension.     Palpations: Abdomen is soft.     Tenderness: There is no abdominal tenderness. There is no right CVA tenderness or left CVA tenderness.  Musculoskeletal:     Cervical back: Normal range of motion and neck supple.     Right lower leg: No edema.     Left lower leg: No edema.  Lymphadenopathy:     Cervical: No cervical adenopathy.  Skin:    General: Skin is warm and dry.  Neurological:     Mental Status: She is alert and oriented to person, place, and time.     Cranial Nerves: Cranial nerves 2-12 are intact.     Gait: Gait is intact.  Psychiatric:        Attention and Perception: Attention normal.        Mood and Affect: Mood normal. Affect is tearful.        Speech: Speech normal.        Behavior: Behavior normal. Behavior is cooperative.        Thought Content: Thought content normal.    Results for orders placed or performed during the hospital encounter of  06/26/23  Comprehensive metabolic panel  Result Value Ref Range   Sodium 137 135 - 145 mmol/L   Potassium 3.4 (L) 3.5 - 5.1 mmol/L   Chloride 101 98 - 111 mmol/L   CO2 23 22 - 32 mmol/L   Glucose, Bld 130 (H) 70 - 99 mg/dL   BUN 16 8 - 23 mg/dL   Creatinine, Ser 1.61 0.44 - 1.00 mg/dL   Calcium 9.3 8.9 - 09.6 mg/dL   Total Protein 7.1 6.5 - 8.1 g/dL   Albumin 4.0 3.5 - 5.0 g/dL   AST 24 15 - 41 U/L   ALT 24 0 - 44 U/L   Alkaline Phosphatase 68 38 - 126 U/L   Total Bilirubin 0.7 0.3 - 1.2 mg/dL   GFR, Estimated >04 >54 mL/min   Anion gap 13 5 - 15  Ethanol  Result Value Ref Range   Alcohol, Ethyl (B) <10 <10 mg/dL  Salicylate level  Result Value Ref Range   Salicylate Lvl <7.0 (L) 7.0 - 30.0 mg/dL  Acetaminophen level  Result Value Ref Range   Acetaminophen (Tylenol), Serum <10 (L) 10 - 30 ug/mL  cbc  Result Value Ref Range   WBC 8.7 4.0 - 10.5 K/uL   RBC 4.60 3.87 - 5.11 MIL/uL   Hemoglobin 14.5 12.0 - 15.0 g/dL   HCT 09.8 11.9 - 14.7 %   MCV 92.8 80.0 - 100.0 fL   MCH 31.5 26.0 - 34.0 pg   MCHC 34.0 30.0 - 36.0 g/dL   RDW 82.9 56.2 - 13.0 %   Platelets 223 150 - 400 K/uL   nRBC 0.0 0.0 - 0.2 %  Lactic acid, plasma  Result Value Ref Range   Lactic Acid, Venous 0.7 0.5 - 1.9 mmol/L      Assessment & Plan:  Problem List Items Addressed This Visit       Cardiovascular and Mediastinum   Aortic atherosclerosis (HCC)    Chronic, ongoing.  Discussed at length with patient and recommended complete cessation of smoking and educated patient on risk with smoking. Patient refuses at this time. Continue statin daily.      Relevant Orders   Comprehensive metabolic panel   Lipid Panel w/o Chol/HDL Ratio     Respiratory   Centrilobular emphysema (HCC)    Chronic, ongoing. Continue collaboration with pulmonary and Trelegy as currently ordered by them, she is tolerating this well.  Continue PRN Albuterol.  Recommend complete cessation smoking.  Continue annual CT  screening.  Continue Singulair to assist with exacerbations caused by allergies, discussed BLACK BOX warning and to alert PCP if SI presents.      Relevant Orders   CBC with Differential/Platelet     Endocrine   Subclinical hyperthyroidism    Ongoing with continued symptoms, has visit this week with Endo.  Will review notes once available.  Continue current medication as ordered by them.        Nervous and Auditory   Nicotine dependence, cigarettes, w unsp disorders    I have recommended complete cessation of tobacco use. I have discussed various options available for assistance with tobacco cessation including over the counter methods (Nicotine gum, patch and lozenges). We also discussed prescription options (Chantix, Nicotine Inhaler / Nasal Spray). The patient is not interested in pursuing any prescription tobacco cessation options at this time.           Musculoskeletal and Integument   Psoriatic arthritis (HCC)    Chronic, ongoing.  Continue collaboration with rheumatology & dermatology and current medication regimen.  Reviewed recent notes.  Will obtain CMP today.        Other   Fibromyalgia    Chronic, ongoing.  Continue Duloxetine for mood and pain, adjust medications as needed.        Hyperlipidemia, mixed    Chronic, ongoing.  Continue Crestor, is tolerating.  Lipid panel today.       Relevant Orders   Comprehensive metabolic panel   Lipid Panel w/o Chol/HDL Ratio   PTSD (post-traumatic stress disorder)    Chronic and not under control.  GAD = 18 and she has multiple anxiety symptoms - weight loss, appetite change, shaking, fatigue + worsening of her IBS-D.  She follows with psychiatry, appreciate their input.  Suspect much of her current weight loss and worsened IBS-D is coming from her anxiety and thyroid issues.  Educated her on anxiety and the effect on gut health.  Her anxiety level continues to be elevated, this has been ongoing since loss of her mother to  cancer.  Recommend she have at length discussion with psychiatry on medication regimen, as may need to further adjust.  Referral to therapy, which would be beneficial.      Relevant Orders   Ambulatory referral to Psychology   Severe recurrent major depression without psychotic features (HCC) - Primary    Chronic and not under control.  She follows with psychiatry, appreciate their input.  Suspect much of her current weight loss and worsened IBS-D is coming from her anxiety and thyroid issues.  Educated her on anxiety and the effect on gut health.  Her anxiety level continues to be elevated, this has been ongoing since loss of her mother to cancer.  Recommend she have at length discussion with psychiatry on medication regimen, as may need  to further adjust.  Referral to therapy, which would be beneficial.      Relevant Orders   Ambulatory referral to Psychology   Unintended weight loss    Ongoing issue, continues to lose weight.  Has had work-up with GI, oncology, and imaging ordered by PCP - all has been reassuring.  She reports GI suspects she has severe IBS-D.  Suspect much of this is related to her anxiety and thyroid issues, which discussed with her today.  Educated her on anxiety and the effect on gut health.  Her anxiety levels continue to be elevated, this has been ongoing since loss of her mother to cancer. Continue Ensure.  May benefit from Mirtazapine in future, will see if psychiatry adjusts regimen in anyway.        Relevant Orders   Vitamin B12   Other Visit Diagnoses     Flu vaccine need       Flu vaccine today, educated patient.        Follow up plan: Return in about 6 weeks (around 08/17/2023) for WEIGHT CHECK, HYPERTHYROID.

## 2023-07-06 NOTE — Assessment & Plan Note (Addendum)
Chronic, ongoing. Continue collaboration with pulmonary and Trelegy as currently ordered by them, she is tolerating this well.  Continue PRN Albuterol.  Recommend complete cessation smoking.  Continue annual CT screening.  Continue Singulair to assist with exacerbations caused by allergies, discussed BLACK BOX warning and to alert PCP if SI presents.

## 2023-07-06 NOTE — Assessment & Plan Note (Signed)
Chronic and not under control.  GAD = 18 and she has multiple anxiety symptoms - weight loss, appetite change, shaking, fatigue + worsening of her IBS-D.  She follows with psychiatry, appreciate their input.  Suspect much of her current weight loss and worsened IBS-D is coming from her anxiety and thyroid issues.  Educated her on anxiety and the effect on gut health.  Her anxiety level continues to be elevated, this has been ongoing since loss of her mother to cancer.  Recommend she have at length discussion with psychiatry on medication regimen, as may need to further adjust.  Referral to therapy, which would be beneficial.

## 2023-07-06 NOTE — Assessment & Plan Note (Signed)
Chronic and not under control.  She follows with psychiatry, appreciate their input.  Suspect much of her current weight loss and worsened IBS-D is coming from her anxiety and thyroid issues.  Educated her on anxiety and the effect on gut health.  Her anxiety level continues to be elevated, this has been ongoing since loss of her mother to cancer.  Recommend she have at length discussion with psychiatry on medication regimen, as may need to further adjust.  Referral to therapy, which would be beneficial.

## 2023-07-06 NOTE — Assessment & Plan Note (Signed)
Chronic, ongoing.  Continue collaboration with rheumatology & dermatology and current medication regimen.  Reviewed recent notes.  Will obtain CMP today.

## 2023-07-07 LAB — CBC WITH DIFFERENTIAL/PLATELET
Basophils Absolute: 0.1 10*3/uL (ref 0.0–0.2)
Basos: 1 %
EOS (ABSOLUTE): 0.1 10*3/uL (ref 0.0–0.4)
Eos: 2 %
Hematocrit: 41.2 % (ref 34.0–46.6)
Hemoglobin: 13.3 g/dL (ref 11.1–15.9)
Immature Grans (Abs): 0 10*3/uL (ref 0.0–0.1)
Immature Granulocytes: 0 %
Lymphocytes Absolute: 1.4 10*3/uL (ref 0.7–3.1)
Lymphs: 30 %
MCH: 31.3 pg (ref 26.6–33.0)
MCHC: 32.3 g/dL (ref 31.5–35.7)
MCV: 97 fL (ref 79–97)
Monocytes Absolute: 0.6 10*3/uL (ref 0.1–0.9)
Monocytes: 13 %
Neutrophils Absolute: 2.5 10*3/uL (ref 1.4–7.0)
Neutrophils: 54 %
Platelets: 197 10*3/uL (ref 150–450)
RBC: 4.25 x10E6/uL (ref 3.77–5.28)
RDW: 13.1 % (ref 11.7–15.4)
WBC: 4.6 10*3/uL (ref 3.4–10.8)

## 2023-07-07 LAB — LIPID PANEL W/O CHOL/HDL RATIO
Cholesterol, Total: 164 mg/dL (ref 100–199)
HDL: 47 mg/dL (ref >39)
LDL Chol Calc (NIH): 99 mg/dL (ref 0–99)
Triglycerides: 98 mg/dL (ref 0–149)
VLDL Cholesterol Cal: 18 mg/dL (ref 5–40)

## 2023-07-07 LAB — COMPREHENSIVE METABOLIC PANEL
ALT: 18 [IU]/L (ref 0–32)
AST: 19 [IU]/L (ref 0–40)
Albumin: 3.9 g/dL (ref 3.9–4.9)
Alkaline Phosphatase: 82 [IU]/L (ref 44–121)
BUN/Creatinine Ratio: 8 — ABNORMAL LOW (ref 12–28)
BUN: 7 mg/dL — ABNORMAL LOW (ref 8–27)
Bilirubin Total: <0.2 mg/dL (ref 0.0–1.2)
CO2: 23 mmol/L (ref 20–29)
Calcium: 9.3 mg/dL (ref 8.7–10.3)
Chloride: 102 mmol/L (ref 96–106)
Creatinine, Ser: 0.89 mg/dL (ref 0.57–1.00)
Globulin, Total: 2.1 g/dL (ref 1.5–4.5)
Glucose: 50 mg/dL — ABNORMAL LOW (ref 70–99)
Potassium: 3.7 mmol/L (ref 3.5–5.2)
Sodium: 142 mmol/L (ref 134–144)
Total Protein: 6 g/dL (ref 6.0–8.5)
eGFR: 74 mL/min/{1.73_m2} (ref >59)

## 2023-07-07 LAB — VITAMIN B12: Vitamin B-12: 1066 pg/mL (ref 232–1245)

## 2023-07-07 NOTE — Progress Notes (Signed)
Contacted via MyChart   Good afternoon Emily Johnston, your labs have returned and are overall stable, except for low glucose (sugar).  This can be diet related, please ensure to try to get health meals and snacks during day + drink supplement 3-4 times a day.  Any questions? Keep being amazing!!  Thank you for allowing me to participate in your care.  I appreciate you. Kindest regards, Britnie Colville

## 2023-08-15 NOTE — Patient Instructions (Signed)

## 2023-08-19 ENCOUNTER — Ambulatory Visit (INDEPENDENT_AMBULATORY_CARE_PROVIDER_SITE_OTHER): Payer: 59 | Admitting: Nurse Practitioner

## 2023-08-19 ENCOUNTER — Encounter: Payer: Self-pay | Admitting: Nurse Practitioner

## 2023-08-19 VITALS — BP 136/82 | HR 66 | Temp 97.7°F | Wt 113.2 lb

## 2023-08-19 DIAGNOSIS — J0110 Acute frontal sinusitis, unspecified: Secondary | ICD-10-CM | POA: Diagnosis not present

## 2023-08-19 DIAGNOSIS — K580 Irritable bowel syndrome with diarrhea: Secondary | ICD-10-CM

## 2023-08-19 DIAGNOSIS — R634 Abnormal weight loss: Secondary | ICD-10-CM | POA: Diagnosis not present

## 2023-08-19 DIAGNOSIS — E0590 Thyrotoxicosis, unspecified without thyrotoxic crisis or storm: Secondary | ICD-10-CM | POA: Diagnosis not present

## 2023-08-19 DIAGNOSIS — F17219 Nicotine dependence, cigarettes, with unspecified nicotine-induced disorders: Secondary | ICD-10-CM

## 2023-08-19 MED ORDER — FLUCONAZOLE 150 MG PO TABS: 150.0000 mg | ORAL_TABLET | Freq: Once | ORAL | 0 refills | Status: AC

## 2023-08-19 MED ORDER — AMOXICILLIN-POT CLAVULANATE 875-125 MG PO TABS: 1.0000 | ORAL_TABLET | Freq: Two times a day (BID) | ORAL | 0 refills | Status: AC

## 2023-08-19 MED ORDER — PREDNISONE 20 MG PO TABS: 40.0000 mg | ORAL_TABLET | Freq: Every day | ORAL | 0 refills | Status: AC

## 2023-08-19 NOTE — Progress Notes (Signed)
BP 136/82   Pulse 66   Temp 97.7 F (36.5 C) (Oral)   Wt 113 lb 3.2 oz (51.3 kg)   SpO2 97%   BMI 19.43 kg/m    Subjective:    Patient ID: Emily Johnston, female    DOB: Jun 26, 1961, 62 y.o.   MRN: [id number]  HPI: Emily Johnston is a 62 y.o. female  Chief Complaint  Patient presents with   Weight Check   Hyperthyroidism   HYPERTHYROIDISM Saw endo last on 07/09/23 and they increased Methimazole to 5 MG per note. Saw GYN on 08/17/23 and obtained pap.  She would like a second opinion on GI issues, continues to have a poor appetite (although some better) and abnormal bowel movements + chronic fatigue.    Feels she may have fluid in ears again and has had sinus issues for 2 weeks with headache. Thyroid control status:stable Fatigue: yes Cold intolerance: no Heat intolerance: no Weight gain: no Weight loss:  has gained some this check Constipation: no Diarrhea/loose stools: yes Palpitations: no Lower extremity edema: no Anxiety/depressed mood: yes   Relevant past medical, surgical, family and social history reviewed and updated as indicated. Interim medical history since our last visit reviewed. Allergies and medications reviewed and updated.  Review of Systems  Constitutional:  Positive for appetite change and fatigue. Negative for activity change, diaphoresis and fever.  HENT:  Positive for congestion, ear pain, postnasal drip, rhinorrhea, sinus pressure and sinus pain. Negative for ear discharge and sore throat.   Respiratory:  Negative for cough, chest tightness and shortness of breath.   Cardiovascular:  Negative for chest pain, palpitations and leg swelling.  Gastrointestinal:  Positive for constipation, diarrhea and nausea (occasional). Negative for abdominal distention, abdominal pain and vomiting.  Endocrine: Negative for cold intolerance and heat intolerance.  Neurological:  Positive for headaches.  Psychiatric/Behavioral:  Positive for sleep  disturbance. Negative for decreased concentration, self-injury and suicidal ideas. The patient is nervous/anxious.     Per HPI unless specifically indicated above     Objective:    BP 136/82   Pulse 66   Temp 97.7 F (36.5 C) (Oral)   Wt 113 lb 3.2 oz (51.3 kg)   SpO2 97%   BMI 19.43 kg/m   Wt Readings from Last 3 Encounters:  08/19/23 113 lb 3.2 oz (51.3 kg)  07/06/23 107 lb 9.6 oz (48.8 kg)  06/26/23 108 lb (49 kg)    Physical Exam Vitals and nursing note reviewed.  Constitutional:      General: She is awake. She is not in acute distress.    Appearance: Normal appearance. She is well-developed and well-groomed. She is obese. She is not ill-appearing or toxic-appearing.  HENT:     Head: Normocephalic.     Right Ear: Hearing, ear canal and external ear normal. A middle ear effusion is present. Tympanic membrane is not injected or perforated.     Left Ear: Hearing, ear canal and external ear normal. A middle ear effusion is present. Tympanic membrane is not injected or perforated.     Nose: Rhinorrhea present. Rhinorrhea is clear.     Right Sinus: Frontal sinus tenderness present. No maxillary sinus tenderness.     Left Sinus: Frontal sinus tenderness present. No maxillary sinus tenderness.     Mouth/Throat:     Mouth: Mucous membranes are moist.     Pharynx: Posterior oropharyngeal erythema (mild) present. No pharyngeal swelling or oropharyngeal exudate.  Eyes:     General:  Lids are normal.        Right eye: No discharge.        Left eye: No discharge.     Conjunctiva/sclera: Conjunctivae normal.     Pupils: Pupils are equal, round, and reactive to light.  Neck:     Thyroid: No thyromegaly.     Vascular: No carotid bruit.  Cardiovascular:     Rate and Rhythm: Normal rate and regular rhythm.     Heart sounds: Normal heart sounds. No murmur heard.    No gallop.  Pulmonary:     Effort: Pulmonary effort is normal. No accessory muscle usage or respiratory distress.      Breath sounds: Wheezing (expiratory wheezes noted throughout) present. No decreased breath sounds or rhonchi.  Abdominal:     General: Bowel sounds are normal. There is no distension.     Palpations: Abdomen is soft. There is no hepatomegaly.     Tenderness: There is generalized abdominal tenderness. There is no right CVA tenderness or left CVA tenderness.  Musculoskeletal:     Cervical back: Normal range of motion and neck supple.     Right lower leg: No edema.     Left lower leg: No edema.  Lymphadenopathy:     Head:     Right side of head: No submental, submandibular, tonsillar, preauricular or posterior auricular adenopathy.     Left side of head: No submental, submandibular, tonsillar, preauricular or posterior auricular adenopathy.  Skin:    General: Skin is warm and dry.  Neurological:     Mental Status: She is alert and oriented to person, place, and time.  Psychiatric:        Attention and Perception: Attention normal.        Mood and Affect: Mood normal.        Speech: Speech normal.        Behavior: Behavior normal. Behavior is cooperative.        Thought Content: Thought content normal.     Results for orders placed or performed in visit on 07/06/23  Comprehensive metabolic panel  Result Value Ref Range   Glucose 50 (L) 70 - 99 mg/dL   BUN 7 (L) 8 - 27 mg/dL   Creatinine, Ser 9.52 0.57 - 1.00 mg/dL   eGFR 74 >84 XL/KGM/0.10   BUN/Creatinine Ratio 8 (L) 12 - 28   Sodium 142 134 - 144 mmol/L   Potassium 3.7 3.5 - 5.2 mmol/L   Chloride 102 96 - 106 mmol/L   CO2 23 20 - 29 mmol/L   Calcium 9.3 8.7 - 10.3 mg/dL   Total Protein 6.0 6.0 - 8.5 g/dL   Albumin 3.9 3.9 - 4.9 g/dL   Globulin, Total 2.1 1.5 - 4.5 g/dL   Bilirubin Total <2.7 0.0 - 1.2 mg/dL   Alkaline Phosphatase 82 44 - 121 IU/L   AST 19 0 - 40 IU/L   ALT 18 0 - 32 IU/L  CBC with Differential/Platelet  Result Value Ref Range   WBC 4.6 3.4 - 10.8 x10E3/uL   RBC 4.25 3.77 - 5.28 x10E6/uL   Hemoglobin  13.3 11.1 - 15.9 g/dL   Hematocrit 25.3 66.4 - 46.6 %   MCV 97 79 - 97 fL   MCH 31.3 26.6 - 33.0 pg   MCHC 32.3 31.5 - 35.7 g/dL   RDW 40.3 47.4 - 25.9 %   Platelets 197 150 - 450 x10E3/uL   Neutrophils 54 Not Estab. %   Lymphs 30 Not  Estab. %   Monocytes 13 Not Estab. %   Eos 2 Not Estab. %   Basos 1 Not Estab. %   Neutrophils Absolute 2.5 1.4 - 7.0 x10E3/uL   Lymphocytes Absolute 1.4 0.7 - 3.1 x10E3/uL   Monocytes Absolute 0.6 0.1 - 0.9 x10E3/uL   EOS (ABSOLUTE) 0.1 0.0 - 0.4 x10E3/uL   Basophils Absolute 0.1 0.0 - 0.2 x10E3/uL   Immature Granulocytes 0 Not Estab. %   Immature Grans (Abs) 0.0 0.0 - 0.1 x10E3/uL  Lipid Panel w/o Chol/HDL Ratio  Result Value Ref Range   Cholesterol, Total 164 100 - 199 mg/dL   Triglycerides 98 0 - 149 mg/dL   HDL 47 >69 mg/dL   VLDL Cholesterol Cal 18 5 - 40 mg/dL   LDL Chol Calc (NIH) 99 0 - 99 mg/dL  Vitamin G29  Result Value Ref Range   Vitamin B-12 1,066 232 - 1,245 pg/mL      Assessment & Plan:   Problem List Items Addressed This Visit       Respiratory   Acute frontal sinusitis    Acute and ongoing for over a week.  Will start Augmentin BID for 7 days and Prednisone 40 MG daily for 5 days.  Dose of Diflucan sent in for yeast with abx therapy.  Recommend complete cessation of nicotine use.  Discussed with her that she would benefit from ENT visit.  Recommend: - Increased rest - Increasing Fluids - Acetaminophen as needed for fever/pain.  - Salt water gargling, chloraseptic spray and throat lozenges - Mucinex.  - Saline sinus flushes or a neti pot.  - Humidifying the air.       Relevant Medications   amoxicillin-clavulanate (AUGMENTIN) 875-125 MG tablet   fluconazole (DIFLUCAN) 150 MG tablet   predniSONE (DELTASONE) 20 MG tablet     Digestive   IBS (irritable bowel syndrome) - Primary    Ongoing and minimal improvement.  She would like a second opinion on GI health.  Will place a referral to [facility] per request.       Relevant Orders   Ambulatory referral to Gastroenterology     Endocrine   Subclinical hyperthyroidism    Ongoing and followed by endo.  Recent notes reviewed.  Continue current medication as ordered by them.        Nervous and Auditory   Nicotine dependence, cigarettes, w unsp disorders    I have recommended complete cessation of tobacco use. I have discussed various options available for assistance with tobacco cessation including over the counter methods (Nicotine gum, patch and lozenges). We also discussed prescription options (Chantix, Nicotine Inhaler / Nasal Spray). The patient is not interested in pursuing any prescription tobacco cessation options at this time.           Other   Unintended weight loss    Has gained this check, but continues to have GI issues and fatigue.  She would like second opinion of these issues, referral to [facility] placed per request.      Relevant Orders   Ambulatory referral to Gastroenterology     Follow up plan: Return in about 3 months (around 11/16/2023) for Annual Physical.

## 2023-08-19 NOTE — Assessment & Plan Note (Signed)
I have recommended complete cessation of tobacco use. I have discussed various options available for assistance with tobacco cessation including over the counter methods (Nicotine gum, patch and lozenges). We also discussed prescription options (Chantix, Nicotine Inhaler / Nasal Spray). The patient is not interested in pursuing any prescription tobacco cessation options at this time.  

## 2023-08-19 NOTE — Assessment & Plan Note (Signed)
Ongoing and followed by endo.  Recent notes reviewed.  Continue current medication as ordered by them.

## 2023-08-19 NOTE — Assessment & Plan Note (Signed)
Has gained this check, but continues to have GI issues and fatigue.  She would like second opinion of these issues, referral to [facility] placed per request.

## 2023-08-19 NOTE — Assessment & Plan Note (Signed)
Ongoing and minimal improvement.  She would like a second opinion on GI health.  Will place a referral to [facility] per request.

## 2023-08-19 NOTE — Assessment & Plan Note (Signed)
Acute and ongoing for over a week.  Will start Augmentin BID for 7 days and Prednisone 40 MG daily for 5 days.  Dose of Diflucan sent in for yeast with abx therapy.  Recommend complete cessation of nicotine use.  Discussed with her that she would benefit from ENT visit.  Recommend: - Increased rest - Increasing Fluids - Acetaminophen as needed for fever/pain.  - Salt water gargling, chloraseptic spray and throat lozenges - Mucinex.  - Saline sinus flushes or a neti pot.  - Humidifying the air.

## 2023-08-25 ENCOUNTER — Ambulatory Visit: Payer: Self-pay

## 2023-08-25 MED ORDER — DOXYCYCLINE HYCLATE 100 MG PO TABS: 100.0000 mg | ORAL_TABLET | Freq: Two times a day (BID) | ORAL | 0 refills | Status: AC

## 2023-08-25 NOTE — Addendum Note (Signed)
Addended by: Aura Dials T on: 08/25/2023 04:54 PM   Modules accepted: Orders

## 2023-08-25 NOTE — Telephone Encounter (Signed)
  Chief Complaint: Ear pain - request for a different ABX Symptoms: ear pain Frequency: before 12/4  Pertinent Negatives: Patient denies  Disposition: [] ED /[] Urgent Care (no appt availability in office) / [] Appointment(In office/virtual)/ []  [facility]/ [] Home Care/ [] Refused Recommended Disposition /[] [facility]/ [x]  Follow-up with PCP Additional Notes: Pt was seen on 12/4 for ear pain and started on ABX and prednisone. PT has completed prednisone and has 3 Augmentin left. Pt states that Augmentin is not working, infact her ears hurt more. Pt would like a different abx called in. Please advise.    Reason for Disposition  Prescription request for new medicine (not a refill)  Answer Assessment - Initial Assessment Questions 1. DRUG NAME: "What medicine do you need to have refilled?"     ABX  5. SYMPTOMS: "Do you have any symptoms?"     Ear pain continues  Protocols used: Medication Refill and Renewal Call-A-AH

## 2023-08-26 NOTE — Telephone Encounter (Signed)
Called to notify and make patient aware of [name]'s recommendations. Unable to leave voice message, as patient's voicemail box is full and not accepting any voice messages.   OK for [facility] to give note if patient calls back.

## 2023-09-04 ENCOUNTER — Telehealth: Payer: Self-pay | Admitting: Nurse Practitioner

## 2023-09-04 NOTE — Telephone Encounter (Signed)
Medication Refill -  Most Recent Primary Care Visit:  Provider: Aura Dials T  Department: [facility]  Visit Type: OFFICE VISIT  Date: 08/19/2023  Medication: Vit D   Has the patient contacted their pharmacy? No (Agent: If no, request that the patient contact the pharmacy for the refill. If patient does not wish to contact the pharmacy document the reason why and proceed with request.) (Agent: If yes, when and what did the pharmacy advise?)  Is this the correct pharmacy for this prescription? Yes If no, delete pharmacy and type the correct one.  This is the patient's preferred pharmacy:  [id number] [place], [place] - [address] [place] [place] [address] Phone: [phone] Fax: [phone]   Has the prescription been filled recently? Yes  Is the patient out of the medication? Yes  Has the patient been seen for an appointment in the last year OR does the patient have an upcoming appointment? Yes  Can we respond through MyChart? No  Agent: Please be advised that Rx refills may take up to 3 business days. We ask that you follow-up with your pharmacy.

## 2023-09-07 ENCOUNTER — Encounter: Payer: Self-pay | Admitting: Nurse Practitioner

## 2023-09-07 DIAGNOSIS — K580 Irritable bowel syndrome with diarrhea: Principal | ICD-10-CM

## 2023-09-07 DIAGNOSIS — R634 Abnormal weight loss: Principal | ICD-10-CM

## 2023-09-07 MED ORDER — CHOLECALCIFEROL 1.25 MG (50000 UT) PO TABS: 1.0000 | ORAL_TABLET | ORAL | 4 refills | Status: DC

## 2023-09-07 MED ORDER — CHOLECALCIFEROL 1.25 MG (50000 UT) PO TABS: 1.0000 | ORAL_TABLET | ORAL | 4 refills | Status: AC

## 2023-09-27 ENCOUNTER — Other Ambulatory Visit: Payer: Self-pay | Admitting: Nurse Practitioner

## 2023-09-29 NOTE — Telephone Encounter (Signed)
 Requested Prescriptions  Pending Prescriptions Disp Refills   rosuvastatin  (CRESTOR ) 10 MG tablet [Pharmacy Med Name: ROSUVASTATIN  CALCIUM  10 MG TAB] 90 tablet 2    Sig: TAKE 1 TABLET BY MOUTH EVERY DAY     Cardiovascular:  Antilipid - Statins 2 Failed - 09/29/2023 12:07 PM      Failed - Lipid Panel in normal range within the last 12 months    Cholesterol, Total  Date Value Ref Range Status  07/06/2023 164 100 - 199 mg/dL Final   LDL Chol Calc (NIH)  Date Value Ref Range Status  07/06/2023 99 0 - 99 mg/dL Final   HDL  Date Value Ref Range Status  07/06/2023 47 >39 mg/dL Final   Triglycerides  Date Value Ref Range Status  07/06/2023 98 0 - 149 mg/dL Final         Passed - Cr in normal range and within 360 days    Creatinine  Date Value Ref Range Status  04/22/2013 0.73 0.60 - 1.30 mg/dL Final   Creatinine, Ser  Date Value Ref Range Status  07/06/2023 0.89 0.57 - 1.00 mg/dL Final         Passed - Patient is not pregnant      Passed - Valid encounter within last 12 months    Recent Outpatient Visits           1 month ago Irritable bowel syndrome with diarrhea   [place] [facility] [place], Gresham T, NP   2 months ago Severe recurrent major depression without psychotic features (HCC)   [place] [facility] [place], [name] T, NP   3 months ago Severe recurrent major depression without psychotic features (HCC)   [place] [facility] [place], [place] T, NP   4 months ago Acute recurrent frontal sinusitis   [place] [facility] [place], Elkton T, NP   6 months ago Sinus pressure   [place] [facility] [place], Melanie DASEN, NP       Future Appointments             In 1 month [place], Melanie DASEN, NP  [facility], [facility]

## 2023-10-23 ENCOUNTER — Ambulatory Visit: Admit: 2023-10-23 | Discharge: 2023-10-24 | Payer: MEDICARE

## 2023-10-23 DIAGNOSIS — J449 Chronic obstructive pulmonary disease, unspecified: Principal | ICD-10-CM

## 2023-10-23 DIAGNOSIS — R634 Abnormal weight loss: Principal | ICD-10-CM

## 2023-10-23 DIAGNOSIS — Z1211 Encounter for screening for malignant neoplasm of colon: Principal | ICD-10-CM

## 2023-10-23 DIAGNOSIS — R109 Unspecified abdominal pain: Principal | ICD-10-CM

## 2023-10-23 DIAGNOSIS — Z114 Encounter for screening for human immunodeficiency virus [HIV]: Principal | ICD-10-CM

## 2023-10-23 DIAGNOSIS — E785 Hyperlipidemia, unspecified: Principal | ICD-10-CM

## 2023-10-23 DIAGNOSIS — F32A Depressive disorder: Principal | ICD-10-CM

## 2023-10-23 DIAGNOSIS — F419 Anxiety disorder, unspecified: Principal | ICD-10-CM

## 2023-10-23 DIAGNOSIS — K582 Mixed irritable bowel syndrome: Principal | ICD-10-CM

## 2023-10-23 DIAGNOSIS — K922 Gastrointestinal hemorrhage, unspecified: Principal | ICD-10-CM

## 2023-10-23 DIAGNOSIS — D126 Benign neoplasm of colon, unspecified: Principal | ICD-10-CM

## 2023-10-23 MED ORDER — NICOTINE (POLACRILEX) 4 MG GUM
BUCCAL | 0 refills | 5.00 days | Status: CN | PRN
Start: 2023-10-23 — End: 2023-11-22

## 2023-10-23 MED ORDER — NICOTINE (POLACRILEX) 4 MG BUCCAL LOZENGE
BUCCAL | 0 refills | 5.00 days | PRN
Start: 2023-10-23 — End: 2023-11-22

## 2023-10-23 MED ORDER — LOPERAMIDE 2 MG CAPSULE
ORAL_CAPSULE | Freq: Four times a day (QID) | ORAL | 2 refills | 8.00 days | Status: CP | PRN
Start: 2023-10-23 — End: ?

## 2023-10-23 MED ORDER — NICOTINE 21 MG/24 HR DAILY TRANSDERMAL PATCH
MEDICATED_PATCH | Freq: Every morning | TRANSDERMAL | 0 refills | 360.00 days | Status: CP
Start: 2023-10-23 — End: 2024-10-22

## 2023-10-26 DIAGNOSIS — F17200 Nicotine dependence, unspecified, uncomplicated: Principal | ICD-10-CM

## 2023-10-26 DIAGNOSIS — J449 Chronic obstructive pulmonary disease, unspecified: Principal | ICD-10-CM

## 2023-11-05 MED ORDER — PEG 3350-ELECTROLYTES 236 GRAM-22.74 GRAM-6.74 GRAM-5.86 GRAM SOLUTION
0 refills | 0.00 days | Status: CP
Start: 2023-11-05 — End: ?

## 2023-11-17 ENCOUNTER — Encounter: Payer: Self-pay | Admitting: Nurse Practitioner

## 2023-11-24 ENCOUNTER — Encounter: Payer: Self-pay | Admitting: Pulmonary Disease

## 2023-11-24 ENCOUNTER — Ambulatory Visit (INDEPENDENT_AMBULATORY_CARE_PROVIDER_SITE_OTHER): Payer: 59 | Admitting: Pulmonary Disease

## 2023-11-24 VITALS — BP 88/58 | HR 78 | Resp 16 | Ht 64.0 in | Wt 106.6 lb

## 2023-11-24 DIAGNOSIS — R634 Abnormal weight loss: Secondary | ICD-10-CM

## 2023-11-24 DIAGNOSIS — F17210 Nicotine dependence, cigarettes, uncomplicated: Secondary | ICD-10-CM

## 2023-11-24 DIAGNOSIS — J449 Chronic obstructive pulmonary disease, unspecified: Secondary | ICD-10-CM

## 2023-11-24 DIAGNOSIS — R911 Solitary pulmonary nodule: Secondary | ICD-10-CM | POA: Diagnosis not present

## 2023-11-24 NOTE — Patient Instructions (Signed)
 VISIT SUMMARY:  During today's visit, we discussed your ongoing health concerns, including your COPD, subsolid lung nodule, significant weight loss, thyroid nodules, memory loss, and balance issues. We reviewed your current medications and symptoms, and we have outlined a plan to address each of these issues.  YOUR PLAN:  -CHRONIC OBSTRUCTIVE PULMONARY DISEASE (COPD): COPD is a chronic lung disease that makes it hard to breathe. You are currently using Trelegy, which is helping, and you do not need your rescue inhaler. However, smoking is making your symptoms worse. We will order pulmonary function tests and encourage you to quit smoking. We will check your respiratory status again in two months.  -SUBSOLID LUNG NODULE: A subsolid lung nodule is a small growth in the lung that needs to be monitored for changes. You are due for a repeat lung cancer screening in August. We will schedule this screening to ensure there are no changes in the nodule.  -WEIGHT LOSS: You have experienced significant weight loss and weakness since last May, which may be related to digestive issues. You are scheduled for another colonoscopy in May. We will follow up with gastroenterology after your colonoscopy to determine the cause of your weight loss.  -THYROID NODULES: Thyroid nodules are lumps in the thyroid gland. Your thyroid function tests were normal in February, but you are still experiencing symptoms. You should contact your endocrinologist for a follow-up appointment to re-evaluate your thyroid function.  -MEMORY LOSS AND BALANCE ISSUES: You are experiencing memory loss and balance issues, which may need further evaluation. Please follow up with your primary care provider to address these symptoms.  INSTRUCTIONS:  Please schedule a follow-up appointment in two months to assess your respiratory status and review the results of any pending tests. Additionally, ensure you have your lung cancer screening in August and  follow up with gastroenterology after your colonoscopy in May. Contact your endocrinologist for a follow-up appointment regarding your thyroid nodules, and see your primary care provider for your memory loss and balance issues.

## 2023-11-24 NOTE — Progress Notes (Signed)
 Subjective:    Patient ID: Emily Johnston, female    DOB: [birth year], 64 y.o.   MRN: [id number]  Patient Care Team: Arcola Jansky, MD as PCP - General (Addiction Medicine) Salena Saner, MD as Consulting Physician (Pulmonary Disease) Michaelyn Barter, MD as Consulting Physician (Oncology)  Chief Complaint  Patient presents with   Follow-up    Presents for follow-up of COPD    BACKGROUND/INTERVAL: Patient is a 63 year old current smoker who follows for the issue of COPD by clinical impression and a subsolid lung nodule.  She was initially seen here on 23 December 2022.  She has failed to have PFTs as requested previously.  She is to have a lung cancer screening CT in August of this year.  This is a scheduled visit.  HPI Discussed the use of AI scribe software for clinical note transcription with the patient, who gave verbal consent to proceed.  History of Present Illness   The patient, with COPD by clinical impression and a subsolid lung nodule, presents for follow-up.   She has been experiencing problematic breathing since last May. She uses Trelegy, which she feels helps, and does not require her rescue inhaler. She recalls using Breztri in the past but cannot remember its effectiveness. She continues to smoke about a pack a day, citing stress and boredom, although she notes that cigarettes do not taste good to her anymore.  She has a subsolid lung nodule that requires monitoring, with a follow-up lung cancer screening test due in August.  She has experienced significant weight loss, dropping from 140 pounds to 106 pounds since last May. She attributes this to being bedridden after a colonoscopy, during which 17 polyps were found, two of which were large and precancerous. She reports persistent weakness, shakes, and an inability to gain weight. She also mentions having low blood pressure and has not had a solid bowel movement since last May. She is scheduled for  another colonoscopy in May, which she finds distressing.  She has a history of thyroid nodules, for which she was previously on Tapazole, but she is no longer taking it as she was told her thyroid was not the cause of her symptoms. Despite this, she continues to experience symptoms such as memory loss and shakes, which affect her daily activities, including applying makeup. She has not had a follow-up appointment with her cardiologist, and she expresses frustration with the lack of resolution to her health issues.  She has an upcoming visit with her new primary care provider.  She reports some difficulty swallowing and has been experiencing balance issues. She lives with her brother, who provides support, and she expresses concern about her ability to manage without him.     Review of Systems A 10 point review of systems was performed and it is as noted above otherwise negative.   Patient Active Problem List   Diagnosis Date Noted   Subclinical hyperthyroidism 03/12/2023   Unintended weight loss 02/24/2023   Adenomatous polyp of colon 02/06/2023   Vitamin D deficiency 04/27/2022   Hyperlipidemia, mixed 05/04/2021   Family history of breast cancer in mother 10/29/2020   Basal cell carcinoma (BCC) 07/10/2020   Spondylosis of cervical spine with myelopathy and radiculopathy 10/24/2019   Aortic atherosclerosis (HCC) 06/15/2019   Psoriatic arthritis (HCC) 03/22/2019   Centrilobular emphysema (HCC) 02/22/2019   Nicotine dependence, cigarettes, w unsp disorders 02/22/2019   IBS (irritable bowel syndrome) 11/01/2018   Severe recurrent major depression without psychotic features (HCC)  01/12/2018   PTSD (post-traumatic stress disorder) 01/12/2018   Fibromyalgia 01/12/2018    Social History   Tobacco Use   Smoking status: Every Day    Current packs/day: 1.00    Average packs/day: 1 pack/day for 41.0 years (41.0 ttl pk-yrs)    Types: Cigarettes   Smokeless tobacco: Never   Tobacco comments:     1 PPD - 12/23/2022 khj  Substance Use Topics   Alcohol use: No    No Known Allergies  Current Meds  Medication Sig   albuterol (VENTOLIN HFA) 108 (90 Base) MCG/ACT inhaler Inhale 2 puffs into the lungs every 6 (six) hours as needed for wheezing or shortness of breath.   Cholecalciferol 1.25 MG (50000 UT) TABS Take 1 tablet by mouth once a week.   clobetasol ointment (TEMOVATE) 0.05 % Apply 1 application  topically 2 (two) times daily.   clonazePAM (KLONOPIN) 1 MG tablet Take 1 mg by mouth 3 (three) times daily as needed for anxiety.   FLUoxetine (PROZAC) 40 MG capsule Take 40 mg by mouth daily.   fluticasone (FLONASE) 50 MCG/ACT nasal spray SPRAY 2 SPRAYS INTO EACH NOSTRIL EVERY DAY   Fluticasone-Umeclidin-Vilant (TRELEGY ELLIPTA) 200-62.5-25 MCG/ACT AEPB Inhale 1 puff into the lungs daily.   folic acid (FOLVITE) 1 MG tablet Take 1 mg by mouth daily.   hydrocortisone 1 % ointment Apply 1 Application topically 2 (two) times daily.   meclizine (ANTIVERT) 12.5 MG tablet Take 1 tablet (12.5 mg total) by mouth 3 (three) times daily as needed for dizziness.   methimazole (TAPAZOLE) 5 MG tablet Take 5 mg by mouth every other day.   metoCLOPramide (REGLAN) 5 MG tablet Take 5 mg by mouth 4 (four) times daily -  before meals and at bedtime.   montelukast (SINGULAIR) 10 MG tablet Take 1 tablet (10 mg total) by mouth at bedtime.   Multiple Vitamin (MULTIVITAMIN) tablet Take 1 tablet by mouth daily.   OTEZLA 30 MG TABS Take 1 tablet by mouth 2 (two) times daily.   pantoprazole (PROTONIX) 40 MG tablet Take 40 mg by mouth daily.   rosuvastatin (CRESTOR) 10 MG tablet TAKE 1 TABLET BY MOUTH EVERY DAY    Immunization History  Administered Date(s) Administered   Influenza, Seasonal, Injecte, Preservative Fre 06/22/2012   Influenza,inj,Quad PF,6+ Mos 09/26/2015, 06/23/2016, 10/25/2018, 06/01/2019, 07/10/2020, 09/18/2021   Influenza-Unspecified 07/03/2013   PFIZER(Purple Top)SARS-COV-2 Vaccination  10/10/2020   Pneumococcal Polysaccharide-23 11/03/2013   Tdap 11/18/2017        Objective:     BP (!) 88/58   Pulse 78   Resp 16   Ht 5\' 4"  (1.626 m)   Wt 106 lb 9.6 oz (48.4 kg)   SpO2 97%   BMI 18.30 kg/m   SpO2: 97 %  GENERAL: Thin well-developed woman, no acute distress, no conversational dyspnea. HEAD: Normocephalic, atraumatic.  EYES: Pupils equal, round, reactive to light.  No scleral icterus.  MOUTH: Dentures, lower poor dentition. NECK: Supple. No thyromegaly. Trachea midline. No JVD.  No adenopathy. PULMONARY: Distant breath sounds bilaterally.  No adventitious sounds. CARDIOVASCULAR: S1 and S2. Regular rate and rhythm.  No rubs, murmurs or gallops heard. ABDOMEN: Scaphoid, otherwise benign. MUSCULOSKELETAL: No joint deformity, no clubbing, no edema.  NEUROLOGIC: No overt focal deficit, no gait disturbance, speech is fluent. SKIN: Intact,warm,dry. PSYCH: Anxious.  Assessment & Plan:     ICD-10-CM   1. COPD suggested by initial evaluation [facility])  J44.9 Pulmonary Function Test [facility] Only    2. Lung  nodule seen on imaging study  R91.1     3. Tobacco dependence due to cigarettes  F17.210       Orders Placed This Encounter  Procedures   Pulmonary Function Test [facility] Only    Standing Status:   Future    Expected Date:   12/25/2023    Expiration Date:   11/23/2024    Full PFT: includes the following: basic spirometry, spirometry pre & post bronchodilator, diffusion capacity (DLCO), lung volumes:   Full PFT    This test can only be performed at:   [facility]   Discussion:    Chronic Obstructive Pulmonary Disease (COPD) She is using Trelegy for COPD management, which is effective. She does not require a rescue inhaler. She continues to smoke approximately one pack per day, exacerbating her COPD symptoms. Smoking cessation is crucial for improving her respiratory health. She reports weakness  potentially related to her overall health. - Reorder pulmonary  function tests - Encourage smoking cessation - Re-evaluate respiratory status in two months  Subsolid Lung Nodule She has a subsolid lung nodule and is due for a repeat lung cancer screening in August, as the last screening was in August of the previous year. - Scheduled for lung cancer screening in August  Weight Loss She reports significant weight loss from 140 lbs to 106 lbs since last May, with associated weakness and tremors. She suspects a digestive issue, but the cause is unclear. A previous colonoscopy revealed 17 polyps, two precancerous. She is scheduled for another colonoscopy in May and is concerned about the procedure due to past experiences. - Follow up with gastroenterology after colonoscopy in May  Thyroid Nodules She has thyroid nodules with normal thyroid function tests in February. She was previously on Tapazole but is not currently taking it. The endocrinologist did not schedule a follow-up, and she is advised to contact the office for further evaluation. Re-evaluation of thyroid function is necessary to address potential underlying issues contributing to her symptoms. - Contact endocrinologist for follow-up appointment - Consider re-evaluation of thyroid function  Memory Loss and Balance Issues She is experiencing memory loss and balance issues, which may require further evaluation by her primary care provider. - Follow up with primary care provider for memory and balance issues  Follow-up She will be re-evaluated in two months to assess her respiratory status and review the results of any pending tests. - Schedule follow-up appointment in two months      Advised if symptoms do not improve or worsen, to please contact office for sooner follow up or seek emergency care.    I spent 32 minutes of dedicated to the care of this patient on the date of this encounter to include pre-visit review of records, face-to-face time with the patient discussing conditions above, post  visit ordering of testing, clinical documentation with the electronic health record, making appropriate referrals as documented, and communicating necessary findings to members of the patients care team.   C. Danice Goltz, MD Advanced Bronchoscopy PCCM [facility]    *This note was generated using voice recognition software/Dragon and/or AI transcription program.  Despite best efforts to proofread, errors can occur which can change the meaning. Any transcriptional errors that result from this process are unintentional and may not be fully corrected at the time of dictation.

## 2023-11-26 ENCOUNTER — Ambulatory Visit: Attending: Pulmonary Disease

## 2023-11-26 DIAGNOSIS — J449 Chronic obstructive pulmonary disease, unspecified: Secondary | ICD-10-CM | POA: Diagnosis not present

## 2023-11-26 DIAGNOSIS — R942 Abnormal results of pulmonary function studies: Secondary | ICD-10-CM | POA: Insufficient documentation

## 2023-11-26 DIAGNOSIS — R0989 Other specified symptoms and signs involving the circulatory and respiratory systems: Secondary | ICD-10-CM | POA: Insufficient documentation

## 2023-11-26 DIAGNOSIS — F17210 Nicotine dependence, cigarettes, uncomplicated: Secondary | ICD-10-CM | POA: Insufficient documentation

## 2023-11-26 LAB — PULMONARY FUNCTION TEST ARMC ONLY
DL/VA % pred: 60 %
DL/VA: 2.54 ml/min/mmHg/L
DLCO unc % pred: 49 %
DLCO unc: 9.89 ml/min/mmHg
FEF 25-75 Post: 2.2 L/s
FEF 25-75 Pre: 0.96 L/s
FEF2575-%Change-Post: 128 %
FEF2575-%Pred-Post: 95 %
FEF2575-%Pred-Pre: 41 %
FEV1-%Change-Post: 22 %
FEV1-%Pred-Post: 78 %
FEV1-%Pred-Pre: 63 %
FEV1-Post: 1.97 L
FEV1-Pre: 1.61 L
FEV1FVC-%Change-Post: 6 %
FEV1FVC-%Pred-Pre: 88 %
FEV6-%Change-Post: 15 %
FEV6-%Pred-Post: 84 %
FEV6-%Pred-Pre: 72 %
FEV6-Post: 2.65 L
FEV6-Pre: 2.3 L
FEV6FVC-%Change-Post: 0 %
FEV6FVC-%Pred-Post: 102 %
FEV6FVC-%Pred-Pre: 101 %
FVC-%Change-Post: 14 %
FVC-%Pred-Post: 81 %
FVC-%Pred-Pre: 71 %
FVC-Post: 2.69 L
FVC-Pre: 2.35 L
Post FEV1/FVC ratio: 73 %
Post FEV6/FVC ratio: 99 %
Pre FEV1/FVC ratio: 69 %
Pre FEV6/FVC Ratio: 98 %
RV % pred: 108 %
RV: 2.19 L
TLC % pred: 92 %
TLC: 4.68 L

## 2023-11-26 MED ORDER — ALBUTEROL SULFATE (2.5 MG/3ML) 0.083% IN NEBU
2.5000 mg | INHALATION_SOLUTION | Freq: Once | RESPIRATORY_TRACT | Status: AC
Start: 2023-11-26 — End: 2023-11-26
  Administered 2023-11-26: 2.5 mg via RESPIRATORY_TRACT
  Filled 2023-11-26: qty 3

## 2023-12-01 DIAGNOSIS — R109 Unspecified abdominal pain: Principal | ICD-10-CM

## 2023-12-04 ENCOUNTER — Ambulatory Visit: Admit: 2023-12-04 | Payer: MEDICAID

## 2023-12-09 DIAGNOSIS — J019 Acute sinusitis, unspecified: Secondary | ICD-10-CM | POA: Diagnosis not present

## 2023-12-09 DIAGNOSIS — B9689 Other specified bacterial agents as the cause of diseases classified elsewhere: Secondary | ICD-10-CM | POA: Diagnosis not present

## 2023-12-09 DIAGNOSIS — J441 Chronic obstructive pulmonary disease with (acute) exacerbation: Secondary | ICD-10-CM | POA: Diagnosis not present

## 2023-12-09 DIAGNOSIS — Z03818 Encounter for observation for suspected exposure to other biological agents ruled out: Secondary | ICD-10-CM | POA: Diagnosis not present

## 2023-12-09 DIAGNOSIS — J209 Acute bronchitis, unspecified: Secondary | ICD-10-CM | POA: Diagnosis not present

## 2023-12-10 ENCOUNTER — Other Ambulatory Visit: Payer: Self-pay | Admitting: Nurse Practitioner

## 2023-12-10 DIAGNOSIS — J3489 Other specified disorders of nose and nasal sinuses: Secondary | ICD-10-CM

## 2023-12-11 NOTE — Telephone Encounter (Signed)
 Requested Prescriptions  Pending Prescriptions Disp Refills   fluticasone (FLONASE) 50 MCG/ACT nasal spray [Pharmacy Med Name: FLUTICASONE PROP 50 MCG SPRAY] 48 mL 2    Sig: SPRAY 2 SPRAYS INTO EACH NOSTRIL EVERY DAY     Ear, Nose, and Throat: Nasal Preparations - Corticosteroids Failed - 12/11/2023 11:14 AM      Failed - Valid encounter within last 12 months    Recent Outpatient Visits   None

## 2023-12-21 DIAGNOSIS — Z03818 Encounter for observation for suspected exposure to other biological agents ruled out: Secondary | ICD-10-CM | POA: Diagnosis not present

## 2023-12-21 DIAGNOSIS — B9689 Other specified bacterial agents as the cause of diseases classified elsewhere: Secondary | ICD-10-CM | POA: Diagnosis not present

## 2023-12-21 DIAGNOSIS — R051 Acute cough: Secondary | ICD-10-CM | POA: Diagnosis not present

## 2023-12-21 DIAGNOSIS — J019 Acute sinusitis, unspecified: Secondary | ICD-10-CM | POA: Diagnosis not present

## 2023-12-21 DIAGNOSIS — R509 Fever, unspecified: Secondary | ICD-10-CM | POA: Diagnosis not present

## 2023-12-21 DIAGNOSIS — J209 Acute bronchitis, unspecified: Secondary | ICD-10-CM | POA: Diagnosis not present

## 2023-12-23 ENCOUNTER — Ambulatory Visit: Admit: 2023-12-23 | Discharge: 2023-12-24

## 2023-12-23 DIAGNOSIS — K922 Gastrointestinal hemorrhage, unspecified: Principal | ICD-10-CM

## 2023-12-23 DIAGNOSIS — R634 Abnormal weight loss: Principal | ICD-10-CM

## 2023-12-23 DIAGNOSIS — K582 Mixed irritable bowel syndrome: Principal | ICD-10-CM

## 2023-12-23 DIAGNOSIS — R109 Unspecified abdominal pain: Principal | ICD-10-CM

## 2023-12-24 MED ORDER — OMEPRAZOLE 20 MG CAPSULE,DELAYED RELEASE
ORAL_CAPSULE | Freq: Every day | ORAL | 1 refills | 90.00 days | Status: CP
Start: 2023-12-24 — End: 2024-06-21

## 2024-01-12 ENCOUNTER — Inpatient Hospital Stay: Admit: 2024-01-12 | Discharge: 2024-01-13 | Payer: Medicare (Managed Care)

## 2024-01-12 DIAGNOSIS — R109 Unspecified abdominal pain: Secondary | ICD-10-CM | POA: Diagnosis not present

## 2024-01-12 DIAGNOSIS — Z87891 Personal history of nicotine dependence: Secondary | ICD-10-CM | POA: Diagnosis not present

## 2024-01-15 ENCOUNTER — Ambulatory Visit: Admit: 2024-01-15 | Discharge: 2024-01-16 | Payer: Medicare (Managed Care)

## 2024-01-15 DIAGNOSIS — F4310 Post-traumatic stress disorder, unspecified: Principal | ICD-10-CM

## 2024-01-15 DIAGNOSIS — L4050 Arthropathic psoriasis, unspecified: Principal | ICD-10-CM

## 2024-01-15 DIAGNOSIS — M25561 Pain in right knee: Principal | ICD-10-CM

## 2024-01-15 DIAGNOSIS — F419 Anxiety disorder, unspecified: Principal | ICD-10-CM

## 2024-01-15 DIAGNOSIS — L409 Psoriasis, unspecified: Principal | ICD-10-CM

## 2024-01-15 DIAGNOSIS — G8929 Other chronic pain: Principal | ICD-10-CM

## 2024-01-15 DIAGNOSIS — J438 Other emphysema: Principal | ICD-10-CM

## 2024-01-15 DIAGNOSIS — K582 Mixed irritable bowel syndrome: Principal | ICD-10-CM

## 2024-01-15 DIAGNOSIS — F17200 Nicotine dependence, unspecified, uncomplicated: Principal | ICD-10-CM

## 2024-01-15 DIAGNOSIS — F332 Major depressive disorder, recurrent severe without psychotic features: Principal | ICD-10-CM

## 2024-01-15 DIAGNOSIS — Z23 Encounter for immunization: Secondary | ICD-10-CM | POA: Diagnosis not present

## 2024-01-26 ENCOUNTER — Encounter: Payer: Self-pay | Admitting: Pulmonary Disease

## 2024-01-26 ENCOUNTER — Ambulatory Visit (INDEPENDENT_AMBULATORY_CARE_PROVIDER_SITE_OTHER): Admitting: Pulmonary Disease

## 2024-01-26 VITALS — BP 110/62 | HR 81 | Temp 97.1°F | Ht 64.0 in | Wt 110.0 lb

## 2024-01-26 DIAGNOSIS — R911 Solitary pulmonary nodule: Secondary | ICD-10-CM

## 2024-01-26 DIAGNOSIS — J449 Chronic obstructive pulmonary disease, unspecified: Secondary | ICD-10-CM | POA: Diagnosis not present

## 2024-01-26 DIAGNOSIS — R5381 Other malaise: Secondary | ICD-10-CM

## 2024-01-26 DIAGNOSIS — F17210 Nicotine dependence, cigarettes, uncomplicated: Secondary | ICD-10-CM

## 2024-01-26 DIAGNOSIS — T7849XA Other allergy, initial encounter: Secondary | ICD-10-CM

## 2024-01-26 MED ORDER — METHYLPREDNISOLONE 4 MG PO TBPK: ORAL_TABLET | ORAL | 0 refills | Status: DC

## 2024-01-26 NOTE — Patient Instructions (Signed)
 VISIT SUMMARY:  Today, you came in for a follow-up visit. Your breathing is stable, and you are continuing to use Trelegy, which you find beneficial. You have resumed exercising and are actively trying to quit smoking with the help of patches and lozenges. You also experienced an allergic reaction after a hair treatment, which you are managing with Benadryl. Additionally, you mentioned an upcoming colonoscopy and a slight increase in your weight.  YOUR PLAN:  -CHRONIC OBSTRUCTIVE PULMONARY DISEASE (COPD) WITH ASTHMA: COPD is a chronic lung disease that makes it hard to breathe, and it can sometimes include asthma symptoms. Your recent breathing test shows a moderate decline since 2017. You should continue using Trelegy, which is helping you. Keep using nicotine patches and lozenges to help quit smoking, but remember to remove the patch before bed. We will schedule a lung cancer screening in August and follow up in 3 months.  -ALLERGIC REACTION TO HAIR PRODUCT: You had an allergic reaction to a hair product, causing swollen eyes and general discomfort. Benadryl has been helping somewhat. I am prescribing medrol  to help manage the allergic reaction. If your symptoms do not improve, follow up with your primary care physician or consider visiting a walk-in clinic if they persist or worsen.  INSTRUCTIONS:  Please continue using Trelegy for your COPD and follow the smoking cessation plan with patches and lozenges, removing the patch before bed. We will schedule a lung cancer screening in August and have a follow-up appointment in 3 months. For your allergic reaction, take the prescribed medrol  and follow up with your primary care physician if symptoms do not improve. Consider visiting a walk-in clinic if symptoms persist or worsen.

## 2024-01-26 NOTE — Progress Notes (Signed)
 Subjective:    Patient ID: Emily Johnston, female    DOB: Apr 27, 1961, 63 y.o.   MRN: [id number]  Patient Care Team: Brent Cambric, MD as PCP - General (Addiction Medicine) Marc Senior, MD as Consulting Physician (Pulmonary Disease) Agrawal, Kavita, MD as Consulting Physician (Oncology)  Chief Complaint  Patient presents with   Follow-up    No SOB or cough. Cough with clear sputum.    BACKGROUND/INTERVAL:Patient is a 63 year old current smoker who follows for the issue of COPD by clinical impression and a subsolid lung nodule. She was initially seen here on 24 November 2023. She had PFTs on 26 November 2023. She had a lung cancer screening CT in August 2024 which was a lung RADS 2 she continues to follow through the lung cancer screening program.  This is a scheduled visit.  HPI Discussed the use of AI scribe software for clinical note transcription with the patient, who gave verbal consent to proceed.  History of Present Illness   Emily Johnston is a 63 year old female with COPD who presents for follow-up.  Her breathing is stable, and she continues to use Trelegy, which she finds beneficial. She has resumed exercising, recently walking over a mile. She notes a decline in her breathing test results since a simple test in 2017.  Results were discussed with the patient.  She was noted to have an FEV1 1.61 L or 63% predicted, FVC of 2.35 L or 71% predicted and an FEV1/FVC of 69%.  She had a significant bronchodilator response of 22% in FEV1.  Diffusion capacity is moderately reduced consistent with moderate obstructive airways disease with reversible component.  She is actively trying to quit smoking and has contacted a quitline for support, which is providing her with patches and lozenges.  She experienced an allergic reaction after a hair treatment with a new stylist, resulting in swollen eyes and general discomfort. Benadryl has been helping, and she feels  'yucky'. No fever.  She mentions an upcoming colonoscopy scheduled for May 20th and expresses dread about it. Her weight today is 110 pounds, slightly higher than her usual 107 pounds, and she struggles to regain weight.   She is compliant with Trelegy Ellipta  200 and as needed albuterol .  Finds that these medications are helpful.     Review of Systems A 10 point review of systems was performed and it is as noted above otherwise negative.   Patient Active Problem List   Diagnosis Date Noted   COPD suggested by initial evaluation (HCC) 11/26/2023   Subclinical hyperthyroidism 03/12/2023   Unintended weight loss 02/24/2023   Adenomatous polyp of colon 02/06/2023   Vitamin D  deficiency 04/27/2022   Hyperlipidemia, mixed 05/04/2021   Family history of breast cancer in mother 10/29/2020   Basal cell carcinoma (BCC) 07/10/2020   Spondylosis of cervical spine with myelopathy and radiculopathy 10/24/2019   Aortic atherosclerosis (HCC) 06/15/2019   Psoriatic arthritis (HCC) 03/22/2019   Centrilobular emphysema (HCC) 02/22/2019   Nicotine dependence, cigarettes, w unsp disorders 02/22/2019   IBS (irritable bowel syndrome) 11/01/2018   Severe recurrent major depression without psychotic features (HCC) 01/12/2018   PTSD (post-traumatic stress disorder) 01/12/2018   Fibromyalgia 01/12/2018    Social History   Tobacco Use   Smoking status: Every Day    Current packs/day: 1.00    Average packs/day: 1 pack/day for 41.0 years (41.0 ttl pk-yrs)    Types: Cigarettes   Smokeless tobacco: Never   Tobacco comments:  1 PPD - 01/26/2024 khj  Substance Use Topics   Alcohol use: No    No Known Allergies  Current Meds  Medication Sig   albuterol  (VENTOLIN  HFA) 108 (90 Base) MCG/ACT inhaler Inhale 2 puffs into the lungs every 6 (six) hours as needed for wheezing or shortness of breath.   Cholecalciferol  1.25 MG (50000 UT) TABS Take 1 tablet by mouth once a week.   clonazePAM  (KLONOPIN ) 1 MG  tablet Take 1 mg by mouth 3 (three) times daily as needed for anxiety.   FLUoxetine (PROZAC) 40 MG capsule Take 40 mg by mouth daily.   fluticasone  (FLONASE ) 50 MCG/ACT nasal spray SPRAY 2 SPRAYS INTO EACH NOSTRIL EVERY DAY   Fluticasone -Umeclidin-Vilant (TRELEGY ELLIPTA ) 200-62.5-25 MCG/ACT AEPB Inhale 1 puff into the lungs daily.   folic acid  (FOLVITE ) 1 MG tablet Take 1 mg by mouth daily.   hydrocortisone  1 % ointment Apply 1 Application topically 2 (two) times daily.   meclizine  (ANTIVERT ) 12.5 MG tablet Take 1 tablet (12.5 mg total) by mouth 3 (three) times daily as needed for dizziness.   methimazole (TAPAZOLE) 5 MG tablet Take 5 mg by mouth every other day.   methylPREDNISolone  (MEDROL  DOSEPAK) 4 MG TBPK tablet Take as directed in the package.  This is a taper pack.   metoCLOPramide (REGLAN) 5 MG tablet Take 5 mg by mouth 4 (four) times daily -  before meals and at bedtime.   montelukast  (SINGULAIR ) 10 MG tablet Take 1 tablet (10 mg total) by mouth at bedtime.   Multiple Vitamin (MULTIVITAMIN) tablet Take 1 tablet by mouth daily.   rosuvastatin  (CRESTOR ) 10 MG tablet TAKE 1 TABLET BY MOUTH EVERY DAY    Immunization History  Administered Date(s) Administered   Influenza, Seasonal, Injecte, Preservative Fre 06/22/2012   Influenza,inj,Quad PF,6+ Mos 09/26/2015, 06/23/2016, 10/25/2018, 06/01/2019, 07/10/2020, 09/18/2021   Influenza-Unspecified 07/03/2013   PFIZER(Purple Top)SARS-COV-2 Vaccination 10/10/2020   Pneumococcal Polysaccharide-23 11/03/2013   Tdap 11/18/2017        Objective:     BP 110/62 (BP Location: Right Arm, Cuff Size: Normal)   Pulse 81   Temp (!) 97.1 F (36.2 C)   Ht 5\' 4"  (1.626 m)   Wt 110 lb (49.9 kg)   SpO2 96%   BMI 18.88 kg/m   SpO2: 96 % O2 Device: None (Room air)  GENERAL: Thin well-developed woman, no acute distress, no conversational dyspnea. HEAD: Normocephalic, atraumatic.  EYES: Pupils equal, round, reactive to light.  No scleral  icterus.  No discharge, mild periorbital edema. MOUTH: Dentures, lower poor dentition. NECK: Supple. No thyromegaly. Trachea midline. No JVD.  No adenopathy. PULMONARY: Distant breath sounds bilaterally.  No adventitious sounds. CARDIOVASCULAR: S1 and S2. Regular rate and rhythm.  No rubs, murmurs or gallops heard. ABDOMEN: Scaphoid, otherwise benign. MUSCULOSKELETAL: No joint deformity, no clubbing, no edema.  NEUROLOGIC: No overt focal deficit, no gait disturbance, speech is fluent. SKIN: Intact,warm,dry. PSYCH: Mildly anxious.       Assessment & Plan:     ICD-10-CM   1. Stage 2 moderate COPD by GOLD classification (HCC)  J44.9     2. Lung nodule seen on imaging study  R91.1     3. Allergic reaction to hair dye  T78.49XA     4. Tobacco dependence due to cigarettes  F17.210       Meds ordered this encounter  Medications   methylPREDNISolone  (MEDROL  DOSEPAK) 4 MG TBPK tablet    Sig: Take as directed in the package.  This  is a taper pack.    Dispense:  21 tablet    Refill:  0   Discussion:    Chronic Obstructive Pulmonary Disease (COPD) with Asthma COPD with an asthma component, moderate severity. Recent pulmonary function test indicates moderate decline since 2017. No wheezing on examination. Continues smoking but has initiated cessation with patches and lozenges. Trelegy is effective. - Continue Trelegy. - Encourage smoking cessation with nicotine patches and lozenges; advise removing the patch before bed to avoid stimulating effects. - Schedule lung cancer screening in August. - Follow up in 3 months.  Allergic Reaction to Hair Product Acute allergic reaction to hair product with symptoms of periorbital edema and general malaise. Managed with Benadryl, providing partial relief. - Prescribe prednisone  for allergic reaction management. - Treat with Medrol  Dosepak. - Advise follow-up with primary care physician if symptoms do not improve. - Consider visiting a walk-in  clinic if symptoms persist or worsen.   Advised if symptoms do not improve or worsen, to please contact office for sooner follow up or seek emergency care.    I spent 33 minutes of dedicated to the care of this patient on the date of this encounter to include pre-visit review of records, face-to-face time with the patient discussing conditions above, post visit ordering of testing, clinical documentation with the electronic health record, making appropriate referrals as documented, and communicating necessary findings to members of the patients care team.     C. Chloe Counter, MD Advanced Bronchoscopy PCCM [facility]    *This note was generated using voice recognition software/Dragon and/or AI transcription program.  Despite best efforts to proofread, errors can occur which can change the meaning. Any transcriptional errors that result from this process are unintentional and may not be fully corrected at the time of dictation.

## 2024-02-02 ENCOUNTER — Encounter
Admit: 2024-02-02 | Discharge: 2024-02-02 | Payer: Medicare (Managed Care) | Attending: Anesthesiology | Primary: Anesthesiology

## 2024-02-02 ENCOUNTER — Inpatient Hospital Stay: Admit: 2024-02-02 | Discharge: 2024-02-02 | Payer: MEDICARE

## 2024-02-02 DIAGNOSIS — M25521 Pain in right elbow: Principal | ICD-10-CM

## 2024-02-02 DIAGNOSIS — G8929 Other chronic pain: Principal | ICD-10-CM

## 2024-02-02 DIAGNOSIS — J449 Chronic obstructive pulmonary disease, unspecified: Secondary | ICD-10-CM | POA: Diagnosis not present

## 2024-02-02 DIAGNOSIS — E785 Hyperlipidemia, unspecified: Secondary | ICD-10-CM | POA: Diagnosis not present

## 2024-02-02 DIAGNOSIS — D122 Benign neoplasm of ascending colon: Secondary | ICD-10-CM | POA: Diagnosis not present

## 2024-02-02 DIAGNOSIS — Z860101 Personal history of adenomatous and serrated colon polyps: Secondary | ICD-10-CM | POA: Diagnosis not present

## 2024-02-02 DIAGNOSIS — Z7951 Long term (current) use of inhaled steroids: Secondary | ICD-10-CM | POA: Diagnosis not present

## 2024-02-02 DIAGNOSIS — Z1211 Encounter for screening for malignant neoplasm of colon: Secondary | ICD-10-CM | POA: Diagnosis not present

## 2024-02-02 DIAGNOSIS — F17200 Nicotine dependence, unspecified, uncomplicated: Secondary | ICD-10-CM | POA: Diagnosis not present

## 2024-02-02 DIAGNOSIS — I2510 Atherosclerotic heart disease of native coronary artery without angina pectoris: Secondary | ICD-10-CM | POA: Diagnosis not present

## 2024-02-02 DIAGNOSIS — K635 Polyp of colon: Secondary | ICD-10-CM | POA: Diagnosis not present

## 2024-02-02 DIAGNOSIS — Z79899 Other long term (current) drug therapy: Secondary | ICD-10-CM | POA: Diagnosis not present

## 2024-02-02 DIAGNOSIS — L4050 Arthropathic psoriasis, unspecified: Secondary | ICD-10-CM | POA: Diagnosis not present

## 2024-02-02 DIAGNOSIS — D126 Benign neoplasm of colon, unspecified: Secondary | ICD-10-CM | POA: Diagnosis not present

## 2024-02-02 DIAGNOSIS — I10 Essential (primary) hypertension: Secondary | ICD-10-CM | POA: Diagnosis not present

## 2024-02-03 DIAGNOSIS — Z1211 Encounter for screening for malignant neoplasm of colon: Secondary | ICD-10-CM | POA: Diagnosis not present

## 2024-02-03 DIAGNOSIS — D122 Benign neoplasm of ascending colon: Secondary | ICD-10-CM | POA: Diagnosis not present

## 2024-02-08 ENCOUNTER — Other Ambulatory Visit: Payer: Self-pay | Admitting: Pulmonary Disease

## 2024-02-16 ENCOUNTER — Inpatient Hospital Stay: Admit: 2024-02-16 | Discharge: 2024-02-17 | Payer: Medicare (Managed Care)

## 2024-02-16 DIAGNOSIS — M170 Bilateral primary osteoarthritis of knee: Secondary | ICD-10-CM | POA: Diagnosis not present

## 2024-02-16 DIAGNOSIS — M25721 Osteophyte, right elbow: Secondary | ICD-10-CM | POA: Diagnosis not present

## 2024-02-16 DIAGNOSIS — M25521 Pain in right elbow: Secondary | ICD-10-CM | POA: Diagnosis not present

## 2024-02-16 DIAGNOSIS — G8929 Other chronic pain: Secondary | ICD-10-CM | POA: Diagnosis not present

## 2024-02-19 ENCOUNTER — Other Ambulatory Visit: Payer: Self-pay | Admitting: Nurse Practitioner

## 2024-02-19 NOTE — Telephone Encounter (Signed)
 Requested Prescriptions  Refused Prescriptions Disp Refills   montelukast  (SINGULAIR ) 10 MG tablet [Pharmacy Med Name: MONTELUKAST  SOD 10 MG TABLET] 90 tablet 4    Sig: TAKE 1 TABLET BY MOUTH EVERYDAY AT BEDTIME     Pulmonology:  Leukotriene Inhibitors Failed - 02/19/2024  3:13 PM      Failed - Valid encounter within last 12 months    Recent Outpatient Visits   None

## 2024-02-26 DIAGNOSIS — L400 Psoriasis vulgaris: Secondary | ICD-10-CM | POA: Diagnosis not present

## 2024-02-26 DIAGNOSIS — D225 Melanocytic nevi of trunk: Secondary | ICD-10-CM | POA: Diagnosis not present

## 2024-02-26 DIAGNOSIS — D2262 Melanocytic nevi of left upper limb, including shoulder: Secondary | ICD-10-CM | POA: Diagnosis not present

## 2024-02-26 DIAGNOSIS — L4059 Other psoriatic arthropathy: Secondary | ICD-10-CM | POA: Diagnosis not present

## 2024-02-26 DIAGNOSIS — D2272 Melanocytic nevi of left lower limb, including hip: Secondary | ICD-10-CM | POA: Diagnosis not present

## 2024-02-26 DIAGNOSIS — D2271 Melanocytic nevi of right lower limb, including hip: Secondary | ICD-10-CM | POA: Diagnosis not present

## 2024-02-26 DIAGNOSIS — D2261 Melanocytic nevi of right upper limb, including shoulder: Secondary | ICD-10-CM | POA: Diagnosis not present

## 2024-02-26 DIAGNOSIS — L821 Other seborrheic keratosis: Secondary | ICD-10-CM | POA: Diagnosis not present

## 2024-03-15 DIAGNOSIS — G8929 Other chronic pain: Secondary | ICD-10-CM | POA: Diagnosis not present

## 2024-03-15 DIAGNOSIS — M1731 Unilateral post-traumatic osteoarthritis, right knee: Secondary | ICD-10-CM | POA: Diagnosis not present

## 2024-03-15 DIAGNOSIS — M2341 Loose body in knee, right knee: Secondary | ICD-10-CM | POA: Diagnosis not present

## 2024-03-22 ENCOUNTER — Emergency Department
Admission: EM | Admit: 2024-03-22 | Discharge: 2024-03-22 | Disposition: A | Discharge status: Home / Self Care | Attending: Emergency Medicine | Admitting: Emergency Medicine

## 2024-03-22 ENCOUNTER — Other Ambulatory Visit: Payer: Self-pay

## 2024-03-22 ENCOUNTER — Emergency Department

## 2024-03-22 DIAGNOSIS — K5900 Constipation, unspecified: Secondary | ICD-10-CM | POA: Insufficient documentation

## 2024-03-22 DIAGNOSIS — J449 Chronic obstructive pulmonary disease, unspecified: Secondary | ICD-10-CM | POA: Insufficient documentation

## 2024-03-22 DIAGNOSIS — R1084 Generalized abdominal pain: Secondary | ICD-10-CM | POA: Insufficient documentation

## 2024-03-22 LAB — CBC WITH DIFFERENTIAL/PLATELET
Abs Immature Granulocytes: 0.01 K/uL (ref 0.00–0.07)
Basophils Absolute: 0.1 K/uL (ref 0.0–0.1)
Basophils Relative: 1 %
Eosinophils Absolute: 0.2 K/uL (ref 0.0–0.5)
Eosinophils Relative: 3 %
HCT: 37.7 % (ref 36.0–46.0)
Hemoglobin: 12.4 g/dL (ref 12.0–15.0)
Immature Granulocytes: 0 %
Lymphocytes Relative: 31 %
Lymphs Abs: 2.2 K/uL (ref 0.7–4.0)
MCH: 31 pg (ref 26.0–34.0)
MCHC: 32.9 g/dL (ref 30.0–36.0)
MCV: 94.3 fL (ref 80.0–100.0)
Monocytes Absolute: 0.6 K/uL (ref 0.1–1.0)
Monocytes Relative: 8 %
Neutro Abs: 4.1 K/uL (ref 1.7–7.7)
Neutrophils Relative %: 57 %
Platelets: 219 K/uL (ref 150–400)
RBC: 4 MIL/uL (ref 3.87–5.11)
RDW: 13.3 % (ref 11.5–15.5)
WBC: 7.2 K/uL (ref 4.0–10.5)
nRBC: 0 % (ref 0.0–0.2)

## 2024-03-22 LAB — COMPREHENSIVE METABOLIC PANEL WITH GFR
ALT: 18 U/L (ref 0–44)
AST: 24 U/L (ref 15–41)
Albumin: 3.6 g/dL (ref 3.5–5.0)
Alkaline Phosphatase: 54 U/L (ref 38–126)
Anion gap: 10 (ref 5–15)
BUN: 23 mg/dL (ref 8–23)
CO2: 24 mmol/L (ref 22–32)
Calcium: 9.3 mg/dL (ref 8.9–10.3)
Chloride: 106 mmol/L (ref 98–111)
Creatinine, Ser: 0.63 mg/dL (ref 0.44–1.00)
GFR, Estimated: >60 mL/min (ref >60)
Glucose, Bld: 82 mg/dL (ref 70–99)
Potassium: 4.2 mmol/L (ref 3.5–5.1)
Sodium: 140 mmol/L (ref 135–145)
Total Bilirubin: 0.5 mg/dL (ref 0.0–1.2)
Total Protein: 6.4 g/dL — ABNORMAL LOW (ref 6.5–8.1)

## 2024-03-22 MED ORDER — MAGNESIUM CITRATE PO SOLN
1.0000 | Freq: Once | ORAL | Status: DC
  Filled 2024-03-22: qty 296

## 2024-03-22 MED ORDER — POLYETHYLENE GLYCOL 3350 17 G PO PACK: 17.0000 g | PACK | Freq: Every day | ORAL | 0 refills | Status: DC

## 2024-03-22 MED ORDER — MAGNESIUM CITRATE PO SOLN
1.0000 | Freq: Once | ORAL | Status: AC
  Administered 2024-03-22: 1 via ORAL
  Filled 2024-03-22: qty 296

## 2024-03-22 NOTE — ED Provider Notes (Signed)
 [facility] Emergency Department Provider Note     Event Date/Time   First MD Initiated Contact with Patient 03/22/24 1505     (approximate)   History   Constipation   HPI  Emily Johnston is a 63 y.o. female with a PMHx of IBS, HLD, COPD, GERD and Fibromyalgia presents to the ED for evaluation of constipation. LBM 6-7 days.  Reports good intake of fluids. Has tried olive oil and an enema with no relief.  Patient reports she recently started a multivitamin.  Endorses nausea and generalized abdominal discomfort and bloating.  Denies fever and rectal bleeding.     Physical Exam   Triage Vital Signs: ED Triage Vitals  Encounter Vitals Group     BP 03/22/24 1348 108/84     Girls Systolic BP Percentile --      Girls Diastolic BP Percentile --      Boys Systolic BP Percentile --      Boys Diastolic BP Percentile --      Pulse Rate 03/22/24 1348 79     Resp 03/22/24 1348 19     Temp 03/22/24 1348 98.9 F (37.2 C)     Temp src --      SpO2 03/22/24 1348 92 %     Weight 03/22/24 1352 110 lb (49.9 kg)     Height 03/22/24 1352 5' 4 (1.626 m)     Head Circumference --      Peak Flow --      Pain Score 03/22/24 1351 8     Pain Loc --      Pain Education --      Exclude from Growth Chart --     Most recent vital signs: Vitals:   03/22/24 1348  BP: 108/84  Pulse: 79  Resp: 19  Temp: 98.9 F (37.2 C)  SpO2: 92%    General Awake, no distress.  HEENT NCAT.  CV:  Good peripheral perfusion.  RESP:  Normal effort.  ABD:  No distention.  Soft.  Mild generalized abdominal discomfort on palpation in all 4 quadrants. Other:  DRE reveals 1 single small external hemorrhoid.  No bleeding.  Using a gloved finger with KY jelly, there was no hard feculent material in the rectum.  No fecal impaction. Patient tolerated the procedure well.   ED Results / Procedures / Treatments   Labs (all labs ordered are listed, but only abnormal results are  displayed) Labs Reviewed  COMPREHENSIVE METABOLIC PANEL WITH GFR - Abnormal; Notable for the following components:      Result Value   Total Protein 6.4 (*)    All other components within normal limits  CBC WITH DIFFERENTIAL/PLATELET   RADIOLOGY  I personally viewed and evaluated these images as part of my medical decision making, as well as reviewing the written report by the radiologist.  ED Provider Interpretation: Abdominal x-ray reveals stool throughout the colon.  No bowel obstruction.  DG Abdomen 1 View Result Date: 03/22/2024 CLINICAL DATA:  Constipation. EXAM: ABDOMEN - 1 VIEW COMPARISON:  Abdominal radiograph dated 02/16/2023. FINDINGS: Large amount of stool throughout the colon. No bowel dilatation or evidence of obstruction. No free air or radiopaque calculi. The osseous structures are intact. The soft tissues are unremarkable. IMPRESSION: Large colonic stool burden. No bowel obstruction. Electronically Signed   By: Vanetta Chou M.D.   On: 03/22/2024 14:51    PROCEDURES:  Critical Care performed: No  Procedures   MEDICATIONS ORDERED IN ED:  Medications  magnesium  citrate solution 1 Bottle (has no administration in time range)     IMPRESSION / MDM / ASSESSMENT AND PLAN / ED COURSE  I reviewed the triage vital signs and the nursing notes.                              Clinical Course as of 03/22/24 1613  Tue Mar 22, 2024  1605 DG Abdomen 1 View IMPRESSION: Large colonic stool burden. No bowel obstruction.   [MH]    Clinical Course User Index [MH] Margrette Monte A, PA-C    63 y.o. female presents to the emergency department for evaluation and treatment of constipation. See HPI for further details.   Differential diagnosis includes, but is not limited to constipation, bowel obstruction, hemorrhoids  Patient's presentation is most consistent with acute complicated illness / injury requiring diagnostic workup.  Patient is alert and oriented.  She is  hemodynamic stable.  Physical exam findings are stated above.  Normal DRE.  No fecal disimpaction performed as there is no fecal in the rectal vault.  X-ray reveals large colonic stool burden with no bowel obstruction.  With shared decision making patient would like to be discharged with magnesium  citrate bottle so she can perform a bowel movement in the comfort of her own home.  Patient currently declined enema in the ED during today's visit.  Will send MiraLAX  to pharmacy.  Thorough instructions on how to use MiraLAX  provided to patient who verbalized understanding.  Patient is in stable condition for discharge home.  ED return precautions discussed.  FINAL CLINICAL IMPRESSION(S) / ED DIAGNOSES   Final diagnoses:  Constipation, unspecified constipation type   Rx / DC Orders   ED Discharge Orders          Ordered    polyethylene glycol (MIRALAX ) 17 g packet  Daily        03/22/24 1605           Note:  This document was prepared using Dragon voice recognition software and may include unintentional dictation errors.    Margrette, Emelin Dascenzo A, PA-C 03/22/24 1613    Jossie Artist POUR, MD 03/22/24 1754

## 2024-03-22 NOTE — ED Triage Notes (Signed)
 Pt last BM 7 days ago. Pt has drank olive oil, used an enema, and has taken senna without any results. Pt says she was passing gas and that has become more difficult. Pt endorses nausea. Pt denies any abdominal surgical hx or hx of bowel obstruction. Pt recently started vitamins with iron.

## 2024-03-22 NOTE — ED Notes (Signed)
 Patient declined discharge vital signs.

## 2024-03-22 NOTE — Discharge Instructions (Addendum)
 You were evaluated in the ED for constipation.  Your physical exam findings are overall reassuring.  Your lab work is normal.  Your abdominal x-ray reveals a large stool burden throughout the colon.  Use MiraLAX  as directed on the package, once daily.  You can mix this powder with 4 to 8 ounces of water or juice every hour until your next bowel movement.  You were provided with magnesium  citrate.  Take half of the bottle this evening, wait 4 to 6 hours to see if you have a bowel movement.  If you do not have a bowel movement by tomorrow morning, take the remaining half bottle.  Do not take more than 1 full bottle in 24 hours.  Follow-up with your primary care provider.  Return to the ED for any symptoms discussed including abdominal pain, vomiting or blood in stool.

## 2024-03-30 DIAGNOSIS — Z91199 Patient's noncompliance with other medical treatment and regimen due to unspecified reason: Secondary | ICD-10-CM | POA: Diagnosis not present

## 2024-03-30 DIAGNOSIS — L4050 Arthropathic psoriasis, unspecified: Secondary | ICD-10-CM | POA: Diagnosis not present

## 2024-03-30 DIAGNOSIS — Z79899 Other long term (current) drug therapy: Secondary | ICD-10-CM | POA: Diagnosis not present

## 2024-03-31 DIAGNOSIS — M778 Other enthesopathies, not elsewhere classified: Secondary | ICD-10-CM | POA: Diagnosis not present

## 2024-03-31 DIAGNOSIS — M19011 Primary osteoarthritis, right shoulder: Secondary | ICD-10-CM | POA: Diagnosis not present

## 2024-03-31 DIAGNOSIS — M7541 Impingement syndrome of right shoulder: Secondary | ICD-10-CM | POA: Diagnosis not present

## 2024-03-31 DIAGNOSIS — M7551 Bursitis of right shoulder: Secondary | ICD-10-CM | POA: Diagnosis not present

## 2024-03-31 DIAGNOSIS — M79601 Pain in right arm: Secondary | ICD-10-CM | POA: Diagnosis not present

## 2024-04-01 ENCOUNTER — Emergency Department

## 2024-04-01 ENCOUNTER — Other Ambulatory Visit: Payer: Self-pay

## 2024-04-01 ENCOUNTER — Emergency Department
Admission: EM | Admit: 2024-04-01 | Discharge: 2024-04-01 | Disposition: A | Discharge status: Home / Self Care | Attending: Emergency Medicine | Admitting: Emergency Medicine

## 2024-04-01 DIAGNOSIS — R109 Unspecified abdominal pain: Secondary | ICD-10-CM | POA: Diagnosis not present

## 2024-04-01 DIAGNOSIS — D72829 Elevated white blood cell count, unspecified: Secondary | ICD-10-CM | POA: Insufficient documentation

## 2024-04-01 DIAGNOSIS — J449 Chronic obstructive pulmonary disease, unspecified: Secondary | ICD-10-CM | POA: Diagnosis not present

## 2024-04-01 DIAGNOSIS — N281 Cyst of kidney, acquired: Secondary | ICD-10-CM | POA: Diagnosis not present

## 2024-04-01 DIAGNOSIS — K5900 Constipation, unspecified: Secondary | ICD-10-CM | POA: Insufficient documentation

## 2024-04-01 LAB — COMPREHENSIVE METABOLIC PANEL WITH GFR
ALT: 17 U/L (ref 0–44)
AST: 23 U/L (ref 15–41)
Albumin: 3.6 g/dL (ref 3.5–5.0)
Alkaline Phosphatase: 54 U/L (ref 38–126)
Anion gap: 10 (ref 5–15)
BUN: 21 mg/dL (ref 8–23)
CO2: 22 mmol/L (ref 22–32)
Calcium: 9.5 mg/dL (ref 8.9–10.3)
Chloride: 106 mmol/L (ref 98–111)
Creatinine, Ser: 0.7 mg/dL (ref 0.44–1.00)
GFR, Estimated: >60 mL/min (ref >60)
Glucose, Bld: 122 mg/dL — ABNORMAL HIGH (ref 70–99)
Potassium: 4 mmol/L (ref 3.5–5.1)
Sodium: 138 mmol/L (ref 135–145)
Total Bilirubin: 0.4 mg/dL (ref 0.0–1.2)
Total Protein: 6.6 g/dL (ref 6.5–8.1)

## 2024-04-01 LAB — CBC WITH DIFFERENTIAL/PLATELET
Abs Immature Granulocytes: 0.07 K/uL (ref 0.00–0.07)
Basophils Absolute: 0 K/uL (ref 0.0–0.1)
Basophils Relative: 0 %
Eosinophils Absolute: 0 K/uL (ref 0.0–0.5)
Eosinophils Relative: 0 %
HCT: 41.2 % (ref 36.0–46.0)
Hemoglobin: 13.5 g/dL (ref 12.0–15.0)
Immature Granulocytes: 1 %
Lymphocytes Relative: 11 %
Lymphs Abs: 1.4 K/uL (ref 0.7–4.0)
MCH: 30.7 pg (ref 26.0–34.0)
MCHC: 32.8 g/dL (ref 30.0–36.0)
MCV: 93.6 fL (ref 80.0–100.0)
Monocytes Absolute: 0.8 K/uL (ref 0.1–1.0)
Monocytes Relative: 6 %
Neutro Abs: 10.7 K/uL — ABNORMAL HIGH (ref 1.7–7.7)
Neutrophils Relative %: 82 %
Platelets: 235 K/uL (ref 150–400)
RBC: 4.4 MIL/uL (ref 3.87–5.11)
RDW: 12.8 % (ref 11.5–15.5)
WBC: 12.9 K/uL — ABNORMAL HIGH (ref 4.0–10.5)
nRBC: 0 % (ref 0.0–0.2)

## 2024-04-01 MED ORDER — IOHEXOL 300 MG/ML  SOLN
75.0000 mL | Freq: Once | INTRAMUSCULAR | Status: AC | PRN
  Administered 2024-04-01: 75 mL via INTRAVENOUS

## 2024-04-01 NOTE — Discharge Instructions (Signed)
 He is at least 3 scoops of MiraLAX  and a glass of water 3 times daily.  If you still have not had a bowel movement after doing that drink an entire bottle of MiraLAX  in 1 day and add Dulcolax. If you are worsening you can either return emergency department or call your GI specialist.

## 2024-04-01 NOTE — ED Provider Notes (Signed)
 [facility] Provider Note    Event Date/Time   First MD Initiated Contact with Patient 04/01/24 1219     (approximate)   History   Constipation   HPI  Emily Johnston is a 63 y.o. female history of IBS, GERD, COPD, polyps presents emergency department with concerns of constipation for 2 to 3 weeks.  Patient states she has had some watery stool but no actual stool come out.  Unsure if she has a blockage at this point.  However no vomiting of fecal material.  No nausea or vomiting.  Just abdominal pain.  Last colonoscopy was in May of this year.  Was told that it was normal.  Patient did try mag citrate and 1 cup of MiraLAX  daily      Physical Exam   Triage Vital Signs: ED Triage Vitals [04/01/24 1131]  Encounter Vitals Group     BP (!) 113/93     Girls Systolic BP Percentile      Girls Diastolic BP Percentile      Boys Systolic BP Percentile      Boys Diastolic BP Percentile      Pulse Rate 96     Resp 18     Temp 98.7 F (37.1 C)     Temp Source Oral     SpO2 95 %     Weight 114 lb (51.7 kg)     Height 5' 4 (1.626 m)     Head Circumference      Peak Flow      Pain Score 6     Pain Loc      Pain Education      Exclude from Growth Chart     Most recent vital signs: Vitals:   04/01/24 1131 04/01/24 1459  BP: (!) 113/93 116/88  Pulse: 96 92  Resp: 18 17  Temp: 98.7 F (37.1 C) 98.3 F (36.8 C)  SpO2: 95% 96%     General: Awake, no distress.   CV:  Good peripheral perfusion.  Resp:  Normal effort.  Abd:  No distention.  Tender in the lower quads bilaterally Other:      ED Results / Procedures / Treatments   Labs (all labs ordered are listed, but only abnormal results are displayed) Labs Reviewed  CBC WITH DIFFERENTIAL/PLATELET - Abnormal; Notable for the following components:      Result Value   WBC 12.9 (*)    Neutro Abs 10.7 (*)    All other components within normal limits  COMPREHENSIVE METABOLIC PANEL WITH GFR  - Abnormal; Notable for the following components:   Glucose, Bld 122 (*)    All other components within normal limits     EKG     RADIOLOGY X-ray of the abdomen, CT abdomen pelvis IV contrast    PROCEDURES:   Procedures  Critical Care: No Chief Complaint  Patient presents with   Constipation      MEDICATIONS ORDERED IN ED: Medications  iohexol  (OMNIPAQUE ) 300 MG/ML solution 75 mL (75 mLs Intravenous Contrast Given 04/01/24 1549)     IMPRESSION / MDM / ASSESSMENT AND PLAN / ED COURSE  I reviewed the triage vital signs and the nursing notes.                              Differential diagnosis includes, but is not limited to, constipation, bowel obstruction, colon cancer, appendicitis or diverticulitis  Patient's presentation  is most consistent with acute illness / injury with system symptoms.   Cardiac monitor no  Labs are reassuring,  X-ray of the abdomen 1 view indicates possible constipation, no blockage, no stool ball noted, this was independently reviewed interpreted by me confirmed by radiology  CT abdomen pelvis IV contrast, did independently review interpret radiologist reading as being negative for a bowel blockage, no cancerous lesion noted, radiologist comments large amount of stool in the ascending, transverse and descending colon however normal amount of stool in the sigmoid and rectum.  I did discuss this with Dr. Claudene.  Recommends going home and doing 3 cups of MiraLAX  at least 3 times a day.  I explained all these findings to the patient.  She is to increase her MiraLAX  3 scoops 3 times daily.  She could even drink the entire bottle of MiraLAX  today to see if it would help alleviate constipation.  She could add Dulcolax if needed.  Follow-up with her GI doctor.  Return if worsening.  Discharged stable condition.      FINAL CLINICAL IMPRESSION(S) / ED DIAGNOSES   Final diagnoses:  Constipation, unspecified constipation type     Rx / DC  Orders   ED Discharge Orders     None        Note:  This document was prepared using Dragon voice recognition software and may include unintentional dictation errors.    Gasper Devere ORN, PA-C 04/01/24 1756    Claudene Rover, MD 04/01/24 [id number]

## 2024-04-01 NOTE — ED Triage Notes (Signed)
 Patient states she hasn't had a bowel movement since 03/15/2024; has tried magnesium  citrate, enemas and fiber supplements with no relief. Complaining of 6/10 abdominal pain.

## 2024-04-13 DIAGNOSIS — R0781 Pleurodynia: Secondary | ICD-10-CM | POA: Diagnosis not present

## 2024-04-21 ENCOUNTER — Ambulatory Visit: Admit: 2024-04-21 | Payer: MEDICAID

## 2024-04-28 ENCOUNTER — Ambulatory Visit: Admitting: Pulmonary Disease

## 2024-04-29 DIAGNOSIS — L309 Dermatitis, unspecified: Secondary | ICD-10-CM | POA: Diagnosis not present

## 2024-05-02 ENCOUNTER — Ambulatory Visit: Admitting: Pulmonary Disease

## 2024-05-05 MED ORDER — OMEPRAZOLE 20 MG CAPSULE,DELAYED RELEASE
ORAL_CAPSULE | 1 refills | 0.00000 days
Start: 2024-05-05 — End: ?

## 2024-05-13 ENCOUNTER — Ambulatory Visit
Admission: RE | Admit: 2024-05-13 | Discharge: 2024-05-13 | Disposition: A | Discharge status: Home / Self Care | Source: Ambulatory Visit | Attending: Acute Care | Admitting: Acute Care

## 2024-05-13 DIAGNOSIS — Z87891 Personal history of nicotine dependence: Secondary | ICD-10-CM | POA: Insufficient documentation

## 2024-05-13 DIAGNOSIS — Z122 Encounter for screening for malignant neoplasm of respiratory organs: Secondary | ICD-10-CM | POA: Insufficient documentation

## 2024-05-13 DIAGNOSIS — F17210 Nicotine dependence, cigarettes, uncomplicated: Secondary | ICD-10-CM | POA: Diagnosis not present

## 2024-05-18 DIAGNOSIS — J441 Chronic obstructive pulmonary disease with (acute) exacerbation: Secondary | ICD-10-CM | POA: Diagnosis not present

## 2024-05-18 DIAGNOSIS — R051 Acute cough: Secondary | ICD-10-CM | POA: Diagnosis not present

## 2024-05-18 DIAGNOSIS — R062 Wheezing: Secondary | ICD-10-CM | POA: Diagnosis not present

## 2024-05-18 DIAGNOSIS — H6503 Acute serous otitis media, bilateral: Secondary | ICD-10-CM | POA: Diagnosis not present

## 2024-05-24 DIAGNOSIS — Z8739 Personal history of other diseases of the musculoskeletal system and connective tissue: Secondary | ICD-10-CM | POA: Diagnosis not present

## 2024-05-24 DIAGNOSIS — Z79899 Other long term (current) drug therapy: Secondary | ICD-10-CM | POA: Diagnosis not present

## 2024-05-24 DIAGNOSIS — L4050 Arthropathic psoriasis, unspecified: Secondary | ICD-10-CM | POA: Diagnosis not present

## 2024-05-25 DIAGNOSIS — J019 Acute sinusitis, unspecified: Secondary | ICD-10-CM | POA: Diagnosis not present

## 2024-05-25 DIAGNOSIS — H6993 Unspecified Eustachian tube disorder, bilateral: Secondary | ICD-10-CM | POA: Diagnosis not present

## 2024-05-25 DIAGNOSIS — B9689 Other specified bacterial agents as the cause of diseases classified elsewhere: Secondary | ICD-10-CM | POA: Diagnosis not present

## 2024-05-27 ENCOUNTER — Other Ambulatory Visit: Payer: Self-pay

## 2024-05-27 DIAGNOSIS — F17210 Nicotine dependence, cigarettes, uncomplicated: Secondary | ICD-10-CM

## 2024-05-27 DIAGNOSIS — Z87891 Personal history of nicotine dependence: Secondary | ICD-10-CM

## 2024-05-27 DIAGNOSIS — Z122 Encounter for screening for malignant neoplasm of respiratory organs: Secondary | ICD-10-CM

## 2024-06-07 ENCOUNTER — Encounter: Payer: Self-pay | Admitting: Pulmonary Disease

## 2024-06-07 ENCOUNTER — Ambulatory Visit (INDEPENDENT_AMBULATORY_CARE_PROVIDER_SITE_OTHER): Admitting: Pulmonary Disease

## 2024-06-07 VITALS — BP 102/74 | HR 67 | Temp 97.6°F | Ht 64.0 in | Wt 118.6 lb

## 2024-06-07 DIAGNOSIS — F17210 Nicotine dependence, cigarettes, uncomplicated: Secondary | ICD-10-CM | POA: Diagnosis not present

## 2024-06-07 DIAGNOSIS — J449 Chronic obstructive pulmonary disease, unspecified: Secondary | ICD-10-CM | POA: Diagnosis not present

## 2024-06-07 DIAGNOSIS — J019 Acute sinusitis, unspecified: Secondary | ICD-10-CM

## 2024-06-07 DIAGNOSIS — G3189 Other specified degenerative diseases of nervous system: Secondary | ICD-10-CM | POA: Diagnosis not present

## 2024-06-07 MED ORDER — METHYLPREDNISOLONE 4 MG PO TBPK
ORAL_TABLET | ORAL | 0 refills | Status: DC
Start: 2024-06-07 — End: 2024-08-09

## 2024-06-07 MED ORDER — DOXYCYCLINE HYCLATE 100 MG PO TABS: 100.0000 mg | ORAL_TABLET | Freq: Two times a day (BID) | ORAL | 0 refills | Status: AC

## 2024-06-07 NOTE — Patient Instructions (Signed)
 VISIT SUMMARY:  Today, we discussed your persistent congestion, smoking cessation efforts, and rib pain. We reviewed your current symptoms and previous treatments, and we have made some adjustments to your medications and provided additional recommendations.  YOUR PLAN:  -CHRONIC OBSTRUCTIVE PULMONARY DISEASE (COPD): COPD is a chronic lung condition that makes it hard to breathe. You are experiencing a flare-up with congestion and wheezing. We will start you on prednisone , which has helped you before, and doxycycline  for the congestion. Remember to use sunscreen while on doxycycline  to avoid sunburn. To prevent a possible yeast infection from the antibiotics, try eating Activia yogurt.  -TOBACCO USE DISORDER: This means you are still smoking, which can worsen your COPD. We encourage you to continue your efforts to quit smoking.  -DEGENERATIVE CHANGES OF THORACIC SPINE: This refers to age-related changes in your spine that can cause pain and discomfort. Your rib pain is likely related to these changes. We recommend physical therapy or specific exercises to help with the pain and suggest following up with your primary care doctor for further management.  INSTRUCTIONS:  Please follow up with your primary care doctor for further management of your rib pain and any additional concerns. Continue your efforts to quit smoking, and use the medications as prescribed. If you experience any side effects or if your symptoms do not improve, contact our office.

## 2024-06-07 NOTE — Progress Notes (Signed)
 Subjective:    Patient ID: Emily Johnston, female    DOB: 04-14-1961, 63 y.o.   MRN: [id number]  Patient Care Team: Louetta Ozell ORN, MD as PCP - General (Internal Medicine) Tamea Dedra CROME, MD as Consulting Physician (Pulmonary Disease) Agrawal, Kavita, MD as Consulting Physician (Oncology)  Chief Complaint  Patient presents with   COPD    Chest congestion. Cough. Occasional wheezing.     BACKGROUND/INTERVAL:Patient is a 63 year old current smoker who follows for the issue of COPDand a subsolid lung nodule. She was initially seen here on 24 November 2023. She had PFTs on 26 November 2023. She had a lung cancer screening CT in August 2024 which was a lung RADS 2 she continues to follow through the lung cancer screening program.  Last visit here was 26 Jan 2024 this is a scheduled visit.  No exacerbations or hospitalizations since then.  HPI Discussed the use of AI scribe software for clinical note transcription with the patient, who gave verbal consent to proceed.  History of Present Illness   Emily Johnston is a 63 year old female with COPD who presents with persistent congestion and smoking cessation efforts.  She experiences persistent congestion despite previous treatment with Augmentin , which she received at a walk-in clinic. The sensation is described as having fluid and difficulty breathing. She has been using an inhaler, Trelegy Ellipta  200, which she feels is 'okay,' and Flonase  nasal spray. These symptoms have persisted for weeks, and she has visited a walk-in clinic twice without resolution.  She continues to smoke approximately three-quarters of a pack of cigarettes per day, despite efforts to cut down.  She mentions a concern about her rib protruding more on one side, causing pain that radiates to her back and affects her ability to sleep on that side. This issue has persisted since she lost weight following a colonoscopy last year. She has had x-rays done, but  no definitive cause has been identified.  She is not allergic to any medications and has expressed concern about developing a yeast infection from antibiotics, noting previous experiences with this issue.      DATA 11/26/2023 PFTs: FEV1 1.61 L or 63% predicted, FVC 2.35 L or send 69%, there is significant bronchodilator response.  Lung volumes are normal.  Diffusion capacity moderate severely impaired.  Consistent with moderate obstructive airways disease with reversible component. 05/13/2024 LDCT chest: Mild to moderate centrilobular emphysema.  Mild diffuse bronchial wall thickening.  Subsegmental mucous plugging.  Unchanged solid pulmonary nodules measuring up to 4.1 mm subpleural left lower lobe ground glass nodule measuring 8.8 mm decreased in size.  Lung RADS 2, benign appearance or behavior.  Review of Systems A 10 point review of systems was performed and it is as noted above otherwise negative.   Patient Active Problem List   Diagnosis Date Noted   COPD suggested by initial evaluation 11/26/2023   Subclinical hyperthyroidism 03/12/2023   Unintended weight loss 02/24/2023   Adenomatous polyp of colon 02/06/2023   Vitamin D  deficiency 04/27/2022   Hyperlipidemia, mixed 05/04/2021   Family history of breast cancer in mother 10/29/2020   Basal cell carcinoma (BCC) 07/10/2020   Spondylosis of cervical spine with myelopathy and radiculopathy 10/24/2019   Aortic atherosclerosis 06/15/2019   Psoriatic arthritis (HCC) 03/22/2019   Centrilobular emphysema (HCC) 02/22/2019   Nicotine dependence, cigarettes, w unsp disorders 02/22/2019   IBS (irritable bowel syndrome) 11/01/2018   Severe recurrent major depression without psychotic features (HCC) 01/12/2018   PTSD (  post-traumatic stress disorder) 01/12/2018   Fibromyalgia 01/12/2018    Social History   Tobacco Use   Smoking status: Every Day    Current packs/day: 0.75    Average packs/day: 1 pack/day for 46.8 years (46.7 ttl  pk-yrs)    Types: Cigarettes    Start date: 1979   Smokeless tobacco: Never  Substance Use Topics   Alcohol use: No    No Known Allergies  Current Meds  Medication Sig   albuterol  (VENTOLIN  HFA) 108 (90 Base) MCG/ACT inhaler Inhale 2 puffs into the lungs every 6 (six) hours as needed for wheezing or shortness of breath.   Cholecalciferol  1.25 MG (50000 UT) TABS Take 1 tablet by mouth once a week.   clonazePAM  (KLONOPIN ) 1 MG tablet Take 1 mg by mouth 3 (three) times daily as needed for anxiety.   [EXPIRED] doxycycline  (VIBRA -TABS) 100 MG tablet Take 1 tablet (100 mg total) by mouth 2 (two) times daily for 7 days.   FLUoxetine (PROZAC) 40 MG capsule Take 40 mg by mouth daily.   fluticasone  (FLONASE ) 50 MCG/ACT nasal spray SPRAY 2 SPRAYS INTO EACH NOSTRIL EVERY DAY   folic acid  (FOLVITE ) 1 MG tablet Take 1 mg by mouth daily.   meclizine  (ANTIVERT ) 12.5 MG tablet Take 1 tablet (12.5 mg total) by mouth 3 (three) times daily as needed for dizziness.   methimazole (TAPAZOLE) 5 MG tablet Take 5 mg by mouth every other day.   methylPREDNISolone  (MEDROL  DOSEPAK) 4 MG TBPK tablet Take as directed on the package.   montelukast  (SINGULAIR ) 10 MG tablet Take 1 tablet (10 mg total) by mouth at bedtime.   Multiple Vitamin (MULTIVITAMIN) tablet Take 1 tablet by mouth daily.   polyethylene glycol (MIRALAX ) 17 g packet Take 17 g by mouth daily.   rosuvastatin  (CRESTOR ) 10 MG tablet TAKE 1 TABLET BY MOUTH EVERY DAY   TRELEGY ELLIPTA  200-62.5-25 MCG/ACT AEPB TAKE 1 PUFF BY MOUTH EVERY DAY    Immunization History  Administered Date(s) Administered   Influenza, Seasonal, Injecte, Preservative Fre 06/22/2012   Influenza,inj,Quad PF,6+ Mos 09/26/2015, 06/23/2016, 10/25/2018, 06/01/2019, 07/10/2020, 09/18/2021   Influenza-Unspecified 07/03/2013   PFIZER(Purple Top)SARS-COV-2 Vaccination 10/10/2020   Pneumococcal Polysaccharide-23 11/03/2013   Tdap 11/18/2017        Objective:     BP 102/74    Pulse 67   Temp 97.6 F (36.4 C) (Temporal)   Ht 5' 4 (1.626 m)   Wt 118 lb 9.6 oz (53.8 kg)   SpO2 94%   BMI 20.36 kg/m   SpO2: 94 %  GENERAL: Thin well-developed woman, no acute distress, no conversational dyspnea.  Very nasal quality to speech. HEAD: Normocephalic, atraumatic.  EYES: Pupils equal, round, reactive to light.  No scleral icterus.  No discharge, mild periorbital edema. MOUTH: Dentures, lower poor dentition.  No thrush. NECK: Supple. No thyromegaly. Trachea midline. No JVD.  No adenopathy. PULMONARY: Distant breath sounds bilaterally.  Rare end expiratory wheeze. CARDIOVASCULAR: S1 and S2. Regular rate and rhythm.  No rubs, murmurs or gallops heard. ABDOMEN: Scaphoid, otherwise benign. MUSCULOSKELETAL: No joint deformity, no clubbing, no edema.  NEUROLOGIC: No overt focal deficit, no gait disturbance, speech is fluent. SKIN: Intact,warm,dry. PSYCH: Mildly anxious.   Assessment & Plan:     ICD-10-CM   1. Stage 2 moderate COPD by GOLD classification (HCC)  J44.9     2. Acute rhinosinusitis  J01.90     3. Tobacco dependence due to cigarettes  F17.210      Meds ordered  this encounter  Medications   doxycycline  (VIBRA -TABS) 100 MG tablet    Sig: Take 1 tablet (100 mg total) by mouth 2 (two) times daily for 7 days.    Dispense:  14 tablet    Refill:  0   methylPREDNISolone  (MEDROL  DOSEPAK) 4 MG TBPK tablet    Sig: Take as directed on the package.    Dispense:  21 tablet    Refill:  0  Discussion:    Chronic obstructive pulmonary disease (COPD) COPD with recent exacerbation characterized by congestion and wheezing. Previous treatment with prednisone  was effective. Current symptoms include congestion and wheezing, with ineffective antibiotic treatment with Augmentin . - Prescribe prednisone  as previously effective for wheezing - Prescribe doxycycline  for acute rhinosinusitis, with advice to use sunscreen due to photosensitivity risk - Advise use of Activia  yogurt to mitigate potential antibiotic-associated yeast infection  Tobacco use disorder Continues to smoke approximately three-quarters of a pack per day. Attempts to cut down on smoking are ongoing. - Encourage smoking cessation efforts  Degenerative changes of thoracic spine Complaints of rib protrusion and pain, exacerbated by pressure and lying on the affected side. Previous imaging showed degenerative changes and arthritis in the thoracic spine. Symptoms likely due to age-related changes and spinal degeneration. - Recommend physical therapy or exercise for rehabilitation - Advise follow-up with primary care for further management      Advised if symptoms do not improve or worsen, to please contact office for sooner follow up or seek emergency care.    I spent 34 minutes of dedicated to the care of this patient on the date of this encounter to include pre-visit review of records, face-to-face time with the patient discussing conditions above, post visit ordering of testing, clinical documentation with the electronic health record, making appropriate referrals as documented, and communicating necessary findings to members of the patients care team.     C. Leita Sanders, MD Advanced Bronchoscopy PCCM [facility]    *This note was generated using voice recognition software/Dragon and/or AI transcription program.  Despite best efforts to proofread, errors can occur which can change the meaning. Any transcriptional errors that result from this process are unintentional and may not be fully corrected at the time of dictation.

## 2024-06-09 ENCOUNTER — Other Ambulatory Visit: Payer: Self-pay | Admitting: Nurse Practitioner

## 2024-06-10 NOTE — Telephone Encounter (Signed)
 Requested medications are due for refill today.  yes  Requested medications are on the active medications list.  yes  Last refill. 09/29/2023 #90 2 rf  Future visit scheduled.   no  Notes to clinic.  Dr. Louetta listed as pcp.    Requested Prescriptions  Pending Prescriptions Disp Refills   rosuvastatin  (CRESTOR ) 10 MG tablet [Pharmacy Med Name: ROSUVASTATIN  CALCIUM  10 MG TAB] 90 tablet 2    Sig: TAKE 1 TABLET BY MOUTH EVERY DAY     Cardiovascular:  Antilipid - Statins 2 Failed - 06/10/2024 11:20 AM      Failed - Valid encounter within last 12 months    Recent Outpatient Visits   None            Failed - Lipid Panel in normal range within the last 12 months    Cholesterol, Total  Date Value Ref Range Status  07/06/2023 164 100 - 199 mg/dL Final   LDL Chol Calc (NIH)  Date Value Ref Range Status  07/06/2023 99 0 - 99 mg/dL Final   HDL  Date Value Ref Range Status  07/06/2023 47 >39 mg/dL Final   Triglycerides  Date Value Ref Range Status  07/06/2023 98 0 - 149 mg/dL Final         Passed - Cr in normal range and within 360 days    Creatinine  Date Value Ref Range Status  04/22/2013 0.73 0.60 - 1.30 mg/dL Final   Creatinine, Ser  Date Value Ref Range Status  04/01/2024 0.70 0.44 - 1.00 mg/dL Final         Passed - Patient is not pregnant

## 2024-06-29 ENCOUNTER — Encounter: Payer: Self-pay | Admitting: Pulmonary Disease

## 2024-07-06 ENCOUNTER — Other Ambulatory Visit: Payer: Self-pay

## 2024-07-06 ENCOUNTER — Emergency Department

## 2024-07-06 ENCOUNTER — Encounter: Payer: Self-pay | Admitting: Intensive Care

## 2024-07-06 ENCOUNTER — Emergency Department
Admission: EM | Admit: 2024-07-06 | Discharge: 2024-07-06 | Discharge status: Against Medical Advice | Source: Ambulatory Visit | Attending: Emergency Medicine | Admitting: Emergency Medicine

## 2024-07-06 DIAGNOSIS — Z5321 Procedure and treatment not carried out due to patient leaving prior to being seen by health care provider: Secondary | ICD-10-CM | POA: Insufficient documentation

## 2024-07-06 DIAGNOSIS — R0602 Shortness of breath: Secondary | ICD-10-CM | POA: Diagnosis not present

## 2024-07-06 DIAGNOSIS — R059 Cough, unspecified: Secondary | ICD-10-CM | POA: Diagnosis not present

## 2024-07-06 DIAGNOSIS — Z981 Arthrodesis status: Secondary | ICD-10-CM | POA: Diagnosis not present

## 2024-07-06 LAB — CBC
HCT: 42.1 % (ref 36.0–46.0)
Hemoglobin: 13.6 g/dL (ref 12.0–15.0)
MCH: 31 pg (ref 26.0–34.0)
MCHC: 32.3 g/dL (ref 30.0–36.0)
MCV: 95.9 fL (ref 80.0–100.0)
Platelets: 222 K/uL (ref 150–400)
RBC: 4.39 MIL/uL (ref 3.87–5.11)
RDW: 12.7 % (ref 11.5–15.5)
WBC: 5 K/uL (ref 4.0–10.5)
nRBC: 0 % (ref 0.0–0.2)

## 2024-07-06 LAB — BASIC METABOLIC PANEL WITH GFR
Anion gap: 10 (ref 5–15)
BUN: 17 mg/dL (ref 8–23)
CO2: 27 mmol/L (ref 22–32)
Calcium: 9.3 mg/dL (ref 8.9–10.3)
Chloride: 103 mmol/L (ref 98–111)
Creatinine, Ser: 0.68 mg/dL (ref 0.44–1.00)
GFR, Estimated: >60 mL/min (ref >60)
Glucose, Bld: 93 mg/dL (ref 70–99)
Potassium: 4.1 mmol/L (ref 3.5–5.1)
Sodium: 140 mmol/L (ref 135–145)

## 2024-07-06 LAB — RESP PANEL BY RT-PCR (RSV, FLU A&B, COVID)  RVPGX2
Influenza A by PCR: NEGATIVE
Influenza B by PCR: NEGATIVE
Resp Syncytial Virus by PCR: NEGATIVE
SARS Coronavirus 2 by RT PCR: NEGATIVE

## 2024-07-06 NOTE — ED Triage Notes (Signed)
 Arrives from [facility] for ED evaluated for failed outpatient therapy.  C/O cough and SOB and has had three rounds of antibiotics, no improvement.

## 2024-07-07 MED ORDER — OMEPRAZOLE 20 MG CAPSULE,DELAYED RELEASE
ORAL_CAPSULE | ORAL | 1 refills | 0.00000 days | Status: CP
Start: 2024-07-07 — End: ?

## 2024-07-11 ENCOUNTER — Telehealth: Payer: Self-pay

## 2024-07-11 NOTE — Telephone Encounter (Signed)
 Called patient to see if she wants to continue to come here to see [name] or will she be going to [facility]. Also appt needs to be changed to an OV if she was not admitted into the hospital. [facility] full.

## 2024-07-12 ENCOUNTER — Ambulatory Visit (INDEPENDENT_AMBULATORY_CARE_PROVIDER_SITE_OTHER): Admitting: Nurse Practitioner

## 2024-07-12 ENCOUNTER — Encounter: Payer: Self-pay | Admitting: Nurse Practitioner

## 2024-07-12 ENCOUNTER — Telehealth: Payer: Self-pay

## 2024-07-12 VITALS — BP 135/88 | HR 69 | Temp 97.9°F | Resp 14 | Ht 64.02 in | Wt 121.2 lb

## 2024-07-12 DIAGNOSIS — J432 Centrilobular emphysema: Secondary | ICD-10-CM

## 2024-07-12 NOTE — Telephone Encounter (Signed)
 Copied from CRM [id number]. Topic: Complaint (DO NOT CONVERT) - Billing/Coding >> Jul 12, 2024  3:42 PM Winona R wrote: DOS: 10/28 Details of complaint: Pt was not seen today and wanted to be sure she was not going to be billed today as she was not seen. She is not happy about not being able to be seen today How would the patient like to see this issue resolved? She wants to make sure she's not charged    Route to Research Officer, [employer]. >> Jul 12, 2024  3:45 PM Winona R wrote: As noted by [place] Pt will not be billed as she was not seen

## 2024-07-12 NOTE — Telephone Encounter (Signed)
 Patient was not seen today because, her appointment was scheduled as a hospital follow up and should have been a new patient appointment due to the fact that she has had a new PCP since she was last seen here.

## 2024-07-13 NOTE — Progress Notes (Signed)
 Error

## 2024-07-13 NOTE — Telephone Encounter (Signed)
 She is scheduled for 11/25 in a new patient spot for 40 minutes.

## 2024-07-29 ENCOUNTER — Other Ambulatory Visit: Payer: Self-pay | Admitting: Rheumatology

## 2024-07-29 DIAGNOSIS — M5416 Radiculopathy, lumbar region: Secondary | ICD-10-CM

## 2024-08-01 ENCOUNTER — Other Ambulatory Visit: Payer: Self-pay | Admitting: Rheumatology

## 2024-08-01 DIAGNOSIS — M5416 Radiculopathy, lumbar region: Secondary | ICD-10-CM

## 2024-08-01 DIAGNOSIS — R9389 Abnormal findings on diagnostic imaging of other specified body structures: Secondary | ICD-10-CM

## 2024-08-06 ENCOUNTER — Ambulatory Visit
Admission: RE | Admit: 2024-08-06 | Discharge: 2024-08-06 | Disposition: A | Discharge status: Home / Self Care | Source: Ambulatory Visit | Attending: Rheumatology | Admitting: Rheumatology

## 2024-08-06 ENCOUNTER — Ambulatory Visit
Admission: RE | Admit: 2024-08-06 | Discharge: 2024-08-06 | Disposition: A | Source: Ambulatory Visit | Attending: Rheumatology | Admitting: Rheumatology

## 2024-08-06 DIAGNOSIS — M5416 Radiculopathy, lumbar region: Secondary | ICD-10-CM

## 2024-08-06 DIAGNOSIS — R9389 Abnormal findings on diagnostic imaging of other specified body structures: Secondary | ICD-10-CM

## 2024-08-07 NOTE — Patient Instructions (Incomplete)
 COPD and Physical Activity Chronic obstructive pulmonary disease (COPD) is a long-term, or chronic, condition that affects the lungs. COPD is a general term that can be used to describe many problems that cause inflammation of the lungs and limit airflow. These conditions include chronic bronchitis and emphysema. The main symptom of COPD is shortness of breath, which makes it harder to do even simple tasks. This can also make it harder to exercise and stay active. Talk with your health care provider about treatments to help you breathe better and actions you can take to prevent breathing problems during physical activity. What are the benefits of exercising when you have COPD? Exercising regularly is an important part of a healthy lifestyle. You can still exercise and do physical activities even though you have COPD. Exercise and physical activity improve your shortness of breath by increasing blood flow (circulation). This causes your heart to pump more oxygen through your body. Moderate exercise can: Improve oxygen use. Increase your energy level. Help with shortness of breath. Strengthen your breathing muscles. Improve heart health. Help with sleep. Improve your self-esteem and feelings of self-worth. Lower depression, stress, and anxiety. Exercise can benefit everyone with COPD. The severity of your disease may affect how hard you can exercise, especially at first, but everyone can benefit. Talk with your health care provider about how much exercise is safe for you, and which activities and exercises are safe for you. What actions can I take to prevent breathing problems during physical activity? Sign up for a pulmonary rehabilitation program. This type of program may include: Education about lung diseases. Exercise classes that teach you how to exercise and be more active while improving your breathing. This usually involves: Exercise using your lower extremities, such as a stationary  bicycle. About 30 minutes of exercise, 2 to 5 times per week, for 6 to 12 weeks. Strength training, such as push-ups or leg lifts. Nutrition education. Group classes in which you can talk with others who also have COPD and learn ways to manage stress. If you use an oxygen tank, you should use it while you exercise. Work with your health care provider to adjust your oxygen for your physical activity. Your resting flow rate is different from your flow rate during physical activity. How to manage your breathing while exercising While you are exercising: Take slow breaths. Pace yourself, and do nottry to go too fast. Purse your lips while breathing out. Pursing your lips is similar to a kissing or whistling position. If doing exercise that uses a quick burst of effort, such as weight lifting: Breathe in before starting the exercise. Breathe out during the hardest part of the exercise, such as raising the weights. Where to find support You can find support for exercising with COPD from: Your health care provider. A pulmonary rehabilitation program. Your local health department or community health programs. Support groups, either online or in-person. Your health care provider may be able to recommend support groups. Where to find more information You can find more information about exercising with COPD from: American Lung Association: lung.org COPD Foundation: copdfoundation.org Contact a health care provider if: Your symptoms get worse. You have nausea. You have a fever. You want to start a new exercise program or a new activity. Get help right away if: You have chest pain. You cannot breathe. These symptoms may represent a serious problem that is an emergency. Do not wait to see if the symptoms will go away. Get medical help right away. Call  your local emergency services (911 in the [place] Do not drive yourself to the hospital. Summary COPD is a general term that can be used to describe  many different lung problems that cause lung inflammation and limit airflow. This includes chronic bronchitis and emphysema. Exercise and physical activity improve your shortness of breath by increasing blood flow (circulation). This causes your heart to provide more oxygen to your body. Contact your health care provider before starting any exercise program or new activity. Ask your health care provider what exercises and activities are safe for you. This information is not intended to replace advice given to you by your health care provider. Make sure you discuss any questions you have with your health care provider. Document Revised: 07/17/2023 Document Reviewed: 07/17/2023 Elsevier Patient Education  2024 [employer].

## 2024-08-09 ENCOUNTER — Ambulatory Visit: Admitting: Nurse Practitioner

## 2024-08-09 VITALS — BP 130/84 | HR 76 | Temp 98.2°F | Resp 14 | Ht 64.02 in | Wt 122.4 lb

## 2024-08-09 DIAGNOSIS — L4050 Arthropathic psoriasis, unspecified: Secondary | ICD-10-CM

## 2024-08-09 DIAGNOSIS — E0590 Thyrotoxicosis, unspecified without thyrotoxic crisis or storm: Secondary | ICD-10-CM | POA: Diagnosis not present

## 2024-08-09 DIAGNOSIS — F332 Major depressive disorder, recurrent severe without psychotic features: Secondary | ICD-10-CM

## 2024-08-09 DIAGNOSIS — J0110 Acute frontal sinusitis, unspecified: Secondary | ICD-10-CM

## 2024-08-09 DIAGNOSIS — E559 Vitamin D deficiency, unspecified: Secondary | ICD-10-CM

## 2024-08-09 DIAGNOSIS — Z1231 Encounter for screening mammogram for malignant neoplasm of breast: Secondary | ICD-10-CM

## 2024-08-09 DIAGNOSIS — J432 Centrilobular emphysema: Secondary | ICD-10-CM

## 2024-08-09 DIAGNOSIS — M797 Fibromyalgia: Secondary | ICD-10-CM

## 2024-08-09 DIAGNOSIS — F4310 Post-traumatic stress disorder, unspecified: Secondary | ICD-10-CM | POA: Diagnosis not present

## 2024-08-09 DIAGNOSIS — E782 Mixed hyperlipidemia: Secondary | ICD-10-CM | POA: Diagnosis not present

## 2024-08-09 DIAGNOSIS — F17219 Nicotine dependence, cigarettes, with unspecified nicotine-induced disorders: Secondary | ICD-10-CM

## 2024-08-09 MED ORDER — AMOXICILLIN-POT CLAVULANATE 875-125 MG PO TABS: 1.0000 | ORAL_TABLET | Freq: Two times a day (BID) | ORAL | 0 refills | Status: AC

## 2024-08-09 MED ORDER — PREDNISONE 20 MG PO TABS: 40.0000 mg | ORAL_TABLET | Freq: Every day | ORAL | 0 refills | Status: AC

## 2024-08-09 MED ORDER — FLUCONAZOLE 150 MG PO TABS: 150.0000 mg | ORAL_TABLET | Freq: Every day | ORAL | 0 refills | Status: AC

## 2024-08-09 NOTE — Assessment & Plan Note (Signed)
 Refer to depression plan of care.

## 2024-08-09 NOTE — Assessment & Plan Note (Signed)
 Chronic, ongoing.  Continue to monitor. Obtain labs today.

## 2024-08-09 NOTE — Assessment & Plan Note (Signed)
 Ongoing and followed by endo.  Recent notes reviewed.  May need to restart medication in future.

## 2024-08-09 NOTE — Assessment & Plan Note (Signed)
 Acute and ongoing for over a week.  Will start Augmentin BID for 7 days and Prednisone 40 MG daily for 5 days.  Dose of Diflucan sent in for yeast with abx therapy.  Recommend complete cessation of nicotine use.  Discussed with her that she would benefit from ENT visit.  Recommend: - Increased rest - Increasing Fluids - Acetaminophen as needed for fever/pain.  - Salt water gargling, chloraseptic spray and throat lozenges - Mucinex.  - Saline sinus flushes or a neti pot.  - Humidifying the air.

## 2024-08-09 NOTE — Assessment & Plan Note (Signed)
 I have recommended complete cessation of tobacco use. I have discussed various options available for assistance with tobacco cessation including over the counter methods (Nicotine gum, patch and lozenges). We also discussed prescription options (Chantix, Nicotine Inhaler / Nasal Spray). The patient is not interested in pursuing any prescription tobacco cessation options at this time.

## 2024-08-09 NOTE — Assessment & Plan Note (Signed)
Chronic, ongoing, recommend she take 2000 units Vitamin D3 daily and check level today. 

## 2024-08-09 NOTE — Assessment & Plan Note (Addendum)
 Chronic, ongoing.  Continue Crestor , is tolerating, adjust as needed.  Lipid panel today.

## 2024-08-09 NOTE — Assessment & Plan Note (Signed)
 Chronic and stable.  Denies SI/HI. She follows with psychiatry, appreciate their input.  Continue current medication regimen as ordered by them and attempt to obtain notes
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
## 2024-08-09 NOTE — Assessment & Plan Note (Signed)
Chronic, ongoing.  Discussed at length with patient and recommended complete cessation of smoking and educated patient on risk with smoking. Patient refuses at this time. Continue statin daily.

## 2024-08-09 NOTE — Assessment & Plan Note (Signed)
Chronic, ongoing. Continue collaboration with pulmonary and Trelegy as currently ordered by them, she is tolerating this well.  Continue PRN Albuterol.  Recommend complete cessation smoking.  Continue annual CT screening.  Continue Singulair to assist with exacerbations caused by allergies, discussed BLACK BOX warning and to alert PCP if SI presents.

## 2024-08-09 NOTE — Assessment & Plan Note (Signed)
 Chronic, ongoing.  Continue collaboration with rheumatology & dermatology and current medication regimen.  Reviewed recent notes.  Will obtain labs today. She is scheduled to see ortho upcoming, appreciate their input.

## 2024-08-10 ENCOUNTER — Ambulatory Visit: Payer: Self-pay

## 2024-08-10 ENCOUNTER — Ambulatory Visit: Payer: Self-pay | Admitting: Nurse Practitioner

## 2024-08-10 LAB — LIPID PANEL W/O CHOL/HDL RATIO
Cholesterol, Total: 217 mg/dL — ABNORMAL HIGH (ref 100–199)
HDL: 54 mg/dL (ref >39)
LDL Chol Calc (NIH): 137 mg/dL — ABNORMAL HIGH (ref 0–99)
Triglycerides: 144 mg/dL (ref 0–149)
VLDL Cholesterol Cal: 26 mg/dL (ref 5–40)

## 2024-08-10 LAB — CBC WITH DIFFERENTIAL/PLATELET
Basophils Absolute: 0.1 x10E3/uL (ref 0.0–0.2)
Basos: 1 %
EOS (ABSOLUTE): 0.2 x10E3/uL (ref 0.0–0.4)
Eos: 3 %
Hematocrit: 40.6 % (ref 34.0–46.6)
Hemoglobin: 13.2 g/dL (ref 11.1–15.9)
Immature Grans (Abs): 0 x10E3/uL (ref 0.0–0.1)
Immature Granulocytes: 0 %
Lymphocytes Absolute: 2.1 x10E3/uL (ref 0.7–3.1)
Lymphs: 36 %
MCH: 31.7 pg (ref 26.6–33.0)
MCHC: 32.5 g/dL (ref 31.5–35.7)
MCV: 97 fL (ref 79–97)
Monocytes Absolute: 0.6 x10E3/uL (ref 0.1–0.9)
Monocytes: 10 %
Neutrophils Absolute: 2.9 x10E3/uL (ref 1.4–7.0)
Neutrophils: 50 %
Platelets: 240 x10E3/uL (ref 150–450)
RBC: 4.17 x10E6/uL (ref 3.77–5.28)
RDW: 12.6 % (ref 11.7–15.4)
WBC: 5.7 x10E3/uL (ref 3.4–10.8)

## 2024-08-10 LAB — COMPREHENSIVE METABOLIC PANEL WITH GFR
ALT: 15 IU/L (ref 0–32)
AST: 20 IU/L (ref 0–40)
Albumin: 4.2 g/dL (ref 3.9–4.9)
Alkaline Phosphatase: 78 IU/L (ref 49–135)
BUN/Creatinine Ratio: 16 (ref 12–28)
BUN: 14 mg/dL (ref 8–27)
Bilirubin Total: <0.2 mg/dL (ref 0.0–1.2)
CO2: 25 mmol/L (ref 20–29)
Calcium: 9.6 mg/dL (ref 8.7–10.3)
Chloride: 102 mmol/L (ref 96–106)
Creatinine, Ser: 0.89 mg/dL (ref 0.57–1.00)
Globulin, Total: 2.2 g/dL (ref 1.5–4.5)
Glucose: 69 mg/dL — ABNORMAL LOW (ref 70–99)
Potassium: 4.2 mmol/L (ref 3.5–5.2)
Sodium: 142 mmol/L (ref 134–144)
Total Protein: 6.4 g/dL (ref 6.0–8.5)
eGFR: 73 mL/min/1.73 (ref >59)

## 2024-08-10 LAB — VITAMIN D 25 HYDROXY (VIT D DEFICIENCY, FRACTURES): Vit D, 25-Hydroxy: 71.2 ng/mL (ref 30.0–100.0)

## 2024-08-10 LAB — TSH: TSH: 1.42 u[IU]/mL (ref 0.450–4.500)

## 2024-08-10 LAB — T4, FREE: Free T4: 1.06 ng/dL (ref 0.82–1.77)

## 2024-08-10 LAB — VITAMIN B12: Vitamin B-12: 495 pg/mL (ref 232–1245)

## 2024-08-10 NOTE — Telephone Encounter (Signed)
 FYI Only or Action Required?: Action required by provider: clinical question for provider.  Patient was last seen in primary care on 08/09/2024 by Valerio Melanie DASEN, NP.  Called Nurse Triage reporting Advice Only.  Symptoms began n/a.  Interventions attempted: Other: n/a.  Symptoms are: n/a.  Triage Disposition: Call PCP When Office is Open  Patient/caregiver understands and will follow disposition?: Yes Reason for Disposition  [1] Caller requesting NON-URGENT health information AND [2] PCP's office is the best resource  Answer Assessment - Initial Assessment Questions Advised patient results came in this morning and provider has not had a chance to go over them yet, we will reach out soon.   1. REASON FOR CALL: What is the main reason for your call? or How can I best help you?     Patient concerned about lab results, especially glucose and cholesterol.  2. SYMPTOMS : Do you have any symptoms?      Tired all the time, no energy  Protocols used: Information Only Call - No Triage-A-AH  Copied from CRM [id number]. Topic: Clinical - Lab/Test Results >> Aug 10, 2024  9:42 AM Rosaria BRAVO wrote: Reason for CRM: Has questions about her lab results, is very concerned.   Best contact: [phone]

## 2024-08-10 NOTE — Progress Notes (Signed)
 Contacted via MyChart  Good afternoon Emily Johnston, your labs have returned and overall are stable with exception of lipid panel which is showing some elevations. Are you taking your Rosuvastatin  daily? If not I would recommend restarting this for prevention of stroke. Any questions? Keep being amazing!!  Thank you for allowing me to participate in your care.  I appreciate you. Kindest regards, Andreea Arca

## 2024-08-16 ENCOUNTER — Ambulatory Visit

## 2024-08-17 ENCOUNTER — Ambulatory Visit: Payer: Self-pay

## 2024-08-17 ENCOUNTER — Other Ambulatory Visit: Payer: Self-pay | Admitting: Nurse Practitioner

## 2024-08-17 ENCOUNTER — Encounter: Payer: Self-pay | Admitting: Nurse Practitioner

## 2024-08-17 MED ORDER — DOXYCYCLINE HYCLATE 100 MG PO TABS: 100.0000 mg | ORAL_TABLET | Freq: Two times a day (BID) | ORAL | 0 refills | Status: AC

## 2024-08-17 NOTE — Telephone Encounter (Signed)
 FYI Only or Action Required?: Action required by provider: medication refill request and clinical question for provider.  Patient was last seen in primary care on 08/09/2024 by Emily Melanie DASEN, NP.  Called Nurse Triage reporting Ear Fullness and Sinusitis.  Symptoms began several days ago.  Interventions attempted: Nothing.  Symptoms are: gradually worsening.  Triage Disposition: See Physician Within 24 Hours  Patient/caregiver understands and will follow disposition?: No, wishes to speak with PCP  Reason for Disposition  [1] Taking antibiotic > 72 hours (3 days) AND [2] sinus pain not improved  Answer Assessment - Initial Assessment Questions Patient declines appt and request alternative antibiotic and medications. LOV 08/09/24  Advised call back or ED/911 if symptoms worsen.  1. ANTIBIOTIC: What antibiotic are you taking? How many times a day?     Augmentin , completed antibiotic. Patient reports symptoms are not getting any better, feel worse. The amoxicillin  did not do it's job. 2. ONSET: When was the antibiotic started?      3. PAIN: How bad is the pain?   (Scale 0-10; or none, mild, moderate or severe)     6/10 pain; sinus; forehead, HA, left ear pain; denies redness, drainage 4. FEVER: Do you have a fever? If Yes, ask: What is it, how was it measured, and when did it start?      Reports fever, chills, nausea, diarrhea Denies vomiting and did not check temp 5. SYMPTOMS: Are there any other symptoms you're concerned about? If Yes, ask: When did it start?  I just feel rotten. Dizziness intermittent  Reports able to stand and walk, eat and drink, normal urine output. Encouraged fluids.  Protocols used: Sinus Infection on Antibiotic Follow-up Call-A-AH Message from Brittany M sent at 08/17/2024 10:07 AM EST  Reason for Triage: Patient was seen last week and is still having symptoms-ear blockage- wanting to know if another medication can be called in-  did not want to come  back to the office

## 2024-08-17 NOTE — Telephone Encounter (Signed)
 Tried calling patient, no answer and no VM. Will try to call again.   OK for E2C2 to give patient the provider's message if she calls back.

## 2024-08-18 ENCOUNTER — Ambulatory Visit

## 2024-08-18 NOTE — Telephone Encounter (Signed)
 See my chart message

## 2024-08-25 MED ORDER — ROSUVASTATIN CALCIUM 10 MG PO TABS: 10.0000 mg | ORAL_TABLET | Freq: Every day | ORAL | 2 refills | Status: AC

## 2024-08-26 ENCOUNTER — Telehealth: Payer: Self-pay

## 2024-08-26 NOTE — Progress Notes (Signed)
° °  08/26/2024  Patient ID: Emily Johnston [place], female   DOB: 1961/02/24, 63 y.o.   MRN: [id number]  This patient is appearing on a report for being at risk of failing the adherence measure for cholesterol (statin) medications this calendar year.   Medication: rosuvastatin  10mg  daily  Last fill date: 08/25/24 for 90 day supply  Insurance report was not up to date. No action needed at this time.   Channing DELENA Mealing, PharmD, DPLA

## 2024-09-10 ENCOUNTER — Other Ambulatory Visit: Payer: Self-pay | Admitting: Nurse Practitioner

## 2024-09-13 ENCOUNTER — Ambulatory Visit: Admitting: Pulmonary Disease

## 2024-09-23 ENCOUNTER — Ambulatory Visit: Payer: Self-pay | Admitting: Family Medicine

## 2024-09-23 ENCOUNTER — Ambulatory Visit: Payer: Self-pay | Admitting: *Deleted

## 2024-09-23 ENCOUNTER — Encounter: Payer: Self-pay | Admitting: Family Medicine

## 2024-09-23 ENCOUNTER — Telehealth: Admitting: Family Medicine

## 2024-09-23 VITALS — Ht 64.02 in | Wt 122.0 lb

## 2024-09-23 DIAGNOSIS — J069 Acute upper respiratory infection, unspecified: Secondary | ICD-10-CM | POA: Diagnosis not present

## 2024-09-23 LAB — VERITOR FLU A/B WAIVED
Influenza A: NEGATIVE
Influenza B: NEGATIVE

## 2024-09-23 LAB — POC COVID19 BINAXNOW: SARS Coronavirus 2 Ag: NEGATIVE

## 2024-09-23 MED ORDER — PREDNISONE 50 MG PO TABS: 50.0000 mg | ORAL_TABLET | Freq: Every day | ORAL | 0 refills | Status: DC

## 2024-09-23 MED ORDER — BENZONATATE 200 MG PO CAPS: 200.0000 mg | ORAL_CAPSULE | Freq: Two times a day (BID) | ORAL | 0 refills | Status: DC | PRN

## 2024-09-23 NOTE — Telephone Encounter (Signed)
 FYI Only or Action Required?: FYI only for provider: appointment scheduled on 1/9.  Patient was last seen in primary care on 08/09/2024 by Valerio Melanie DASEN, NP.  Called Nurse Triage reporting Diarrhea (Cough, body ache, fever).  Symptoms began yesterday.  Interventions attempted: Rest, hydration, or home remedies.  Symptoms are: gradually worsening.  Triage Disposition: Call PCP Within 24 Hours  Patient/caregiver understands and will follow disposition?: yes  Copied from CRM #[id number]. Topic: Clinical - Red Word Triage >> Sep 23, 2024 11:35 AM Delon HERO wrote: Red Word that prompted transfer to Nurse Triage: Patient is calling to report symptoms that started yesterday diahea, temperature of 100, body aches, extreme fatigue, taking otc flu medication, patient is moving and is unable to come in. Requesting medication Reason for Disposition  Patient is HIGH RISK (e.g., 65 years and older, pregnant, HIV+, or chronic medical condition)  Answer Assessment - Initial Assessment Questions Patient is calling with concerns for flu- she is moving and is not able to come into the office. VV scheduled.   1. SYMPTOMS: What is your main symptom or concern? (e.g., cough, fever, shortness of breath, muscle aches)     Body ache, diarrhea, fever 2. ONSET: When did the symptoms start?      yesterday 3. COUGH: Do you have a cough? If Yes, ask: How bad is the cough?       Non productive 4. FEVER: Do you have a fever? If Yes, ask: What is your temperature, how was it measured, and when did it start?     Unknown- feels hot 5. BREATHING DIFFICULTY: Are you having any difficulty breathing? (e.g., normal; shortness of breath, wheezing, unable to speak)      No- trelegy use 6. BETTER-SAME-WORSE: Are you getting better, staying the same or getting worse compared to yesterday?  If getting worse, ask, In what way?     same 7. OTHER SYMPTOMS: Do you have any other symptoms?  (e.g., chills,  fatigue, headache, loss of smell or taste, muscle pain, sore throat)     Fatigue, headache, sore throat 8. INFLUENZA EXPOSURE: Was there any known exposure to influenza (flu) before the symptoms began?      unknown 9. INFLUENZA SUSPECTED: Why do you think you have influenza? (e.g., positive flu self-test at home, symptoms after exposure).     symptoms 10. INFLUENZA VACCINE: Have you had the flu vaccine? If Yes, ask: When did you last get it?       no 11. HIGH RISK FOR COMPLICATIONS: Do you have any chronic medical problems? (e.g., asthma, heart or lung disease, obesity, weak immune system)       emphysema  13. O2 SATURATION MONITOR:  Do you use an oxygen saturation monitor (pulse oximeter) at home? If Yes, ask What is your reading (oxygen level) today? What is your usual oxygen saturation reading? (e.g., 95%)       na  Protocols used: Influenza (Flu) Suspected-A-AH

## 2024-09-23 NOTE — Progress Notes (Signed)
 "  Ht 5' 4.02 (1.626 m)   Wt 122 lb (55.3 kg)   BMI 20.93 kg/m    Subjective:    Patient ID: Emily Johnston, female    DOB: 07-09-1961, 64 y.o.   MRN: [id number]  HPI: Emily Johnston is a 64 y.o. female  Chief Complaint  Patient presents with   URI    Started yesterday and hit her hard. Symptoms diahhrea, body aches, sore thorat, fever, headaches, ears hurt, some sneezing. OTC cold/flu, ibuprofen not helping. No testing and has not left bed.    UPPER RESPIRATORY TRACT INFECTION Duration: 24 hours Worst symptom: diarrhea, body aches, cough Fever: yes  Cough: yes Shortness of breath: no Wheezing: no Chest pain: no Chest tightness: no Chest congestion: no Nasal congestion: no Runny nose: yes Post nasal drip: no Sneezing: no Sore throat: yes Swollen glands: no Sinus pressure: no Headache: yes Face pain: no Toothache: no Ear pain: no  Ear pressure: yes bilateral Eyes red/itching:no Eye drainage/crusting: no  Vomiting: no Diarrhea: yes Rash: no Fatigue: yes Sick contacts: yes Strep contacts: no  Context: yes Recurrent sinusitis: no Relief with OTC cold/cough medications: no  Treatments attempted: none   Relevant past medical, surgical, family and social history reviewed and updated as indicated. Interim medical history since our last visit reviewed. Allergies and medications reviewed and updated.  Review of Systems  Constitutional:  Positive for chills, diaphoresis, fatigue and fever. Negative for activity change, appetite change and unexpected weight change.  HENT:  Positive for congestion, postnasal drip and rhinorrhea. Negative for dental problem, drooling, ear discharge, ear pain, facial swelling, hearing loss, mouth sores, nosebleeds, sinus pressure, sinus pain, sneezing, sore throat, tinnitus, trouble swallowing and voice change.   Eyes: Negative.   Respiratory:  Positive for cough. Negative for apnea, choking, chest tightness, shortness of  breath, wheezing and stridor.   Cardiovascular: Negative.   Gastrointestinal:  Positive for diarrhea. Negative for abdominal distention, anal bleeding, blood in stool, constipation, nausea, rectal pain and vomiting.  Musculoskeletal:  Positive for myalgias. Negative for arthralgias, back pain, gait problem, joint swelling, neck pain and neck stiffness.  Psychiatric/Behavioral: Negative.      Per HPI unless specifically indicated above     Objective:    Ht 5' 4.02 (1.626 m)   Wt 122 lb (55.3 kg)   BMI 20.93 kg/m   Wt Readings from Last 3 Encounters:  09/23/24 122 lb (55.3 kg)  08/09/24 122 lb 6.4 oz (55.5 kg)  07/12/24 121 lb 3.2 oz (55 kg)    Physical Exam Vitals and nursing note reviewed.  Constitutional:      General: She is not in acute distress.    Appearance: Normal appearance. She is ill-appearing. She is not toxic-appearing or diaphoretic.  HENT:     Head: Normocephalic and atraumatic.     Right Ear: External ear normal.     Left Ear: External ear normal.     Nose: Nose normal.     Mouth/Throat:     Mouth: Mucous membranes are moist.     Pharynx: Oropharynx is clear.  Eyes:     General: No scleral icterus.       Right eye: No discharge.        Left eye: No discharge.     Conjunctiva/sclera: Conjunctivae normal.     Pupils: Pupils are equal, round, and reactive to light.  Pulmonary:     Effort: Pulmonary effort is normal. No respiratory distress.  Comments: Speaking in full sentences Musculoskeletal:        General: Normal range of motion.     Cervical back: Normal range of motion.  Skin:    Coloration: Skin is not jaundiced or pale.     Findings: No bruising, erythema, lesion or rash.  Neurological:     Mental Status: She is alert and oriented to person, place, and time. Mental status is at baseline.  Psychiatric:        Mood and Affect: Mood normal.        Behavior: Behavior normal.        Thought Content: Thought content normal.        Judgment:  Judgment normal.     Results for orders placed or performed in visit on 09/23/24  POC COVID-19   Collection Time: 09/23/24  3:32 PM  Result Value Ref Range   SARS Coronavirus 2 Ag Negative Negative      Assessment & Plan:   Problem List Items Addressed This Visit   None Visit Diagnoses       Upper respiratory tract infection, unspecified type    -  Primary   Ill appearing. Will come in for flu swab. Await results. Treat as needed.   Relevant Orders   Veritor Flu A/B Waived   POC COVID-19 (Completed)        Follow up plan: Return for As scheduled.    This visit was completed via video visit through MyChart due to the restrictions of the COVID-19 pandemic. All issues as above were discussed and addressed. Physical exam was done as above through visual confirmation on video through MyChart. If it was felt that the patient should be evaluated in the office, they were directed there. The patient verbally consented to this visit. Location of the patient: home Location of the provider: work Those involved with this call:  Provider: Duwaine Louder, DO CMA: Izetta Sarah, CMA Front Desk/Registration: Claretta Maiden  Time spent on call: 15 minutes with patient face to face via video conference. More than 50% of this time was spent in counseling and coordination of care. 23 minutes total spent in review of patient's record and preparation of their chart.    "

## 2024-09-23 NOTE — Telephone Encounter (Signed)
 Patient already seen.

## 2024-09-26 ENCOUNTER — Encounter: Payer: Self-pay | Admitting: Family Medicine

## 2024-09-26 ENCOUNTER — Ambulatory Visit: Payer: Self-pay | Admitting: Family Medicine

## 2024-09-26 MED ORDER — AZITHROMYCIN 250 MG PO TABS: ORAL_TABLET | ORAL | 0 refills | Status: DC

## 2024-09-26 NOTE — Telephone Encounter (Signed)
 FYI Only or Action Required?: Action required by provider: request for atbx to be sent in.  Patient was last seen in primary care on 09/23/2024 by Vicci Duwaine SQUIBB, DO.  Called Nurse Triage reporting Nasal Congestion.  Symptoms began several days ago.  Interventions attempted: OTC medications: robitussin and Prescription medications: trelogy, prednisone .  Symptoms are: gradually worsening.  Triage Disposition: See Physician Within 24 Hours  Patient/caregiver understands and will follow disposition?: No, wishes to speak with PCP     Copied from CRM #[id number]. Topic: Clinical - Red Word Triage >> Sep 26, 2024 12:19 PM Donna BRAVO wrote: Red Word that prompted transfer to Nurse Triage:  virtual appt 09/24/23 negative for flu asking for antibiotic  not any better feeling worse sore throat very painful diarrhea congestion Weakness Ear pain  Head pressure  predniSONE  (DELTASONE ) 50 MG tablet  has not helped at all Reason for Disposition  Earache  Answer Assessment - Initial Assessment Questions Ears hurt, sore throat, head full of congestion, draining into her throat, slight cough. States hit her last Thursday. States dr vicci said she'd call her in atbx if not better. States she has no energy, feels worse. Prednisone  not helping, did not get cough medicine as her insurance wouldn't cover it. She has been using robitussin, vitamin c, inhaler. Believes she is running a low grade fever but has not checked temp. Denies any higher acuity symptoms. RN advised if atbx will not be called in, staff will call to schedule appt. She states she doesn't feel will and cannot come in for an appt. She states she just needs something to knock this out.   Requesting medicine be called to [facility] [address] in [place]   1. LOCATION: Where does it hurt?      Nasal congestion, ears, denies a head ache 2. ONSET: When did the sinus pain start?  (e.g., hours, days)      Symptoms started on  Thursday 3. SEVERITY: How bad is the pain?   (Scale 0-10; or none, mild, moderate or severe)     Sore throat and ear pin 4. RECURRENT SYMPTOM: Have you ever had sinus problems before? If Yes, ask: When was the last time? and What happened that time?      Yes, atbx 5. NASAL CONGESTION: Is the nose blocked? If Yes, ask: Can you open it or must you breathe through your mouth?     Yes head congestion 6. NASAL DISCHARGE: Do you have discharge from your nose? If so ask, What color?     denies 7. FEVER: Do you have a fever? If Yes, ask: What is it, how was it measured, and when did it start?      Low grade fever 8. OTHER SYMPTOMS: Do you have any other symptoms? (e.g., sore throat, cough, earache, difficulty breathing)     Earache, sore throate  Protocols used: Sinus Pain or Congestion-A-AH

## 2024-09-26 NOTE — Telephone Encounter (Signed)
 See mychart message that was responded to by Dr. Vicci.

## 2024-09-28 ENCOUNTER — Encounter: Payer: Self-pay | Admitting: Family Medicine

## 2024-09-28 ENCOUNTER — Ambulatory Visit
Admission: RE | Admit: 2024-09-28 | Discharge: 2024-09-28 | Disposition: A | Discharge status: Home / Self Care | Source: Ambulatory Visit | Attending: Emergency Medicine | Admitting: Emergency Medicine

## 2024-09-28 ENCOUNTER — Ambulatory Visit: Payer: Self-pay | Admitting: Nurse Practitioner

## 2024-09-28 VITALS — BP 139/90 | HR 78 | Temp 99.3°F | Resp 18 | Wt 123.9 lb

## 2024-09-28 DIAGNOSIS — J029 Acute pharyngitis, unspecified: Secondary | ICD-10-CM

## 2024-09-28 DIAGNOSIS — J111 Influenza due to unidentified influenza virus with other respiratory manifestations: Secondary | ICD-10-CM

## 2024-09-28 LAB — POCT RAPID STREP A (OFFICE): Rapid Strep A Screen: NEGATIVE

## 2024-09-28 MED ORDER — PROMETHAZINE-DM 6.25-15 MG/5ML PO SYRP: 5.0000 mL | ORAL_SOLUTION | Freq: Four times a day (QID) | ORAL | 0 refills | Status: AC | PRN

## 2024-09-28 MED ORDER — IPRATROPIUM BROMIDE 0.06 % NA SOLN: 2.0000 | Freq: Four times a day (QID) | NASAL | 12 refills | Status: AC

## 2024-09-28 MED ORDER — BENZONATATE 200 MG PO CAPS: 200.0000 mg | ORAL_CAPSULE | Freq: Two times a day (BID) | ORAL | 0 refills | Status: AC | PRN

## 2024-09-28 NOTE — Telephone Encounter (Signed)
 FYI Only or Action Required?: Action required by provider: clinical question for provider.  Patient was last seen in primary care on 09/23/2024 by Emily Duwaine SQUIBB, DO.  Called Nurse Triage reporting Ear Fullness and Sore Throat.  Symptoms began several weeks ago.  Interventions attempted: OTC medications: allergy meds, cold meds and Prescription medications: azithromycin .  Symptoms are: unchanged.  Triage Disposition: See Physician Within 24 Hours  Patient/caregiver understands and will follow disposition?: No, wishes to speak with PCP     Copied from CRM [id number]. Topic: Clinical - Red Word Triage >> Sep 28, 2024  2:49 PM Emily Johnston wrote: Red Word that prompted transfer to Nurse Triage: Patient is still running a fever and reports ear pain, severe sore throat, and diarrhea. Patient is requesting amoxicillin . Reason for Disposition  [1] Taking antibiotic > 72 hours (3 days) AND [2] sinus pain not improved  Answer Assessment - Initial Assessment Questions Pt states not any worse but not any better. States she started the azithythromycin on Saturday (upon chart review do not see azithromycin ) and it never works for her. She states [facility] at her throat is hurting, ear pain, head congestion. She states amoxicillin  works best for her. She is requesting this be [facility] to [facility] on [address] in [place]. She states her cough isn't so bad, its more the throat, ear and head congestions. She has tried multiple over the counter medications/remedies with no relief. Please advise.       1. ANTIBIOTIC: What antibiotic are you taking? How many times a day?     States azithromycin  2. ONSET: When was the antibiotic started?     States Saturday 3. PAIN: How bad is the pain?   (Scale 0-10; or none, mild, moderate or severe)     States miserable 4. FEVER: Do you have a fever? If Yes, ask: What is it, how was it measured, and when did it start?       States brother feels forehead every day and says she's hot 5. SYMPTOMS: Are there any other symptoms you're concerned about? If Yes, ask: When did it start?     Head congestion, sore throat, ear pain/fullness  Protocols used: Sinus Infection on Antibiotic Follow-up Call-A-AH

## 2024-09-28 NOTE — ED Provider Notes (Signed)
 " [facility]    CSN: [id number] Arrival date & time: 09/28/24  1338      History   Chief Complaint Chief Complaint  Patient presents with   Sore Throat   Otalgia   Chills   Cough    HPI Emily Johnston is a 64 y.o. female.   HPI  64 year old female with a past medical history significant for severe recurrent MDD, PTSD, fibromyalgia, centrilobular emphysema, IBS, and hyperlipidemia presents for evaluation of 5 days worth of respiratory symptoms which include subjective fever, runny nose, nasal congestion, bilateral ear pain, sore throat, and cough.  She did an e-visit with her PCP 5 days ago and was prescribed azithromycin  which she reports has not improved her symptoms.  Past Medical History:  Diagnosis Date   Anxiety    Arthritis    COPD (chronic obstructive pulmonary disease) (HCC)    Degenerative disc disease, cervical    Depression    Fibromyalgia    GERD (gastroesophageal reflux disease)    HLD (hyperlipidemia)    IBS (irritable bowel syndrome)    Psoriasis    PTSD (post-traumatic stress disorder)     Patient Active Problem List   Diagnosis Date Noted   Subclinical hyperthyroidism 03/12/2023   Adenomatous polyp of colon 02/06/2023   Vitamin D  deficiency 04/27/2022   Hyperlipidemia, mixed 05/04/2021   Family history of breast cancer in mother 10/29/2020   Acute non-recurrent frontal sinusitis 05/14/2020   Spondylosis of cervical spine with myelopathy and radiculopathy 10/24/2019   Aortic atherosclerosis 06/15/2019   Psoriatic arthritis (HCC) 03/22/2019   Centrilobular emphysema (HCC) 02/22/2019   Nicotine dependence, cigarettes, w unsp disorders 02/22/2019   IBS (irritable bowel syndrome) 11/01/2018   Severe recurrent major depression without psychotic features (HCC) 01/12/2018   PTSD (post-traumatic stress disorder) 01/12/2018   Fibromyalgia 01/12/2018    Past Surgical History:  Procedure Laterality Date   CARPAL TUNNEL RELEASE      COLONOSCOPY WITH PROPOFOL  N/A 02/06/2023   Procedure: COLONOSCOPY WITH PROPOFOL ;  Surgeon: Therisa Bi, MD;  Location: [facility] ENDOSCOPY;  Service: Endoscopy;  Laterality: N/A;   ESOPHAGOGASTRODUODENOSCOPY (EGD) WITH PROPOFOL  N/A 02/06/2023   Procedure: ESOPHAGOGASTRODUODENOSCOPY (EGD) WITH PROPOFOL ;  Surgeon: Therisa Bi, MD;  Location: [facility] ENDOSCOPY;  Service: Endoscopy;  Laterality: N/A;   KNEE SURGERY     NECK SURGERY     TOTAL ABDOMINAL HYSTERECTOMY      OB History   No obstetric history on file.      Home Medications    Prior to Admission medications  Medication Sig Start Date End Date Taking? Authorizing Provider  ipratropium (ATROVENT ) 0.06 % nasal spray Place 2 sprays into both nostrils 4 (four) times daily. 09/28/24  Yes Bernardino Ditch, NP  promethazine -dextromethorphan (PROMETHAZINE -DM) 6.25-15 MG/5ML syrup Take 5 mLs by mouth 4 (four) times daily as needed. 09/28/24  Yes Bernardino Ditch, NP  albuterol  (VENTOLIN  HFA) 108 (90 Base) MCG/ACT inhaler Inhale 2 puffs into the lungs every 6 (six) hours as needed for wheezing or shortness of breath. 09/04/20   Johnson, Megan P, DO  benzonatate  (TESSALON ) 200 MG capsule Take 1 capsule (200 mg total) by mouth 2 (two) times daily as needed for cough. 09/28/24   Bernardino Ditch, NP  Cholecalciferol  1.25 MG (50000 UT) TABS Take 1 tablet by mouth once a week. 09/07/23   Cannady, [name] T, NP  clonazePAM  (KLONOPIN ) 1 MG tablet Take 1 mg by mouth 3 (three) times daily as needed for anxiety. 04/20/23   [provider]  D3-50 1.25 MG (50000 UT) capsule TAKE 1 CAPSULE BY MOUHT ONCE WEEK 09/12/24   Cannady, [name] T, NP  fluconazole  (DIFLUCAN ) 150 MG tablet Take 1 tablet (150 mg total) by mouth daily. 08/09/24   Cannady, [name] T, NP  FLUoxetine (PROZAC) 40 MG capsule Take 40 mg by mouth daily. 04/21/23   [provider]  fluticasone  (FLONASE ) 50 MCG/ACT nasal spray SPRAY 2 SPRAYS INTO EACH NOSTRIL EVERY DAY 03/18/23   Cannady, [name] T, NP  folic  acid (FOLVITE ) 1 MG tablet Take 1 mg by mouth daily.    [provider]  leflunomide (ARAVA) 20 MG tablet Take 20 mg by mouth daily. 08/04/24   [provider]  meclizine  (ANTIVERT ) 12.5 MG tablet Take 1 tablet (12.5 mg total) by mouth 3 (three) times daily as needed for dizziness. 03/18/23   Cannady, [name] T, NP  montelukast  (SINGULAIR ) 10 MG tablet Take 1 tablet (10 mg total) by mouth at bedtime. 11/24/22   Cannady, [name] T, NP  Multiple Vitamin (MULTIVITAMIN) tablet Take 1 tablet by mouth daily.    [provider]  rosuvastatin  (CRESTOR ) 10 MG tablet Take 1 tablet (10 mg total) by mouth daily. 08/25/24   Cannady, [name] T, NP  traZODone  (DESYREL ) 100 MG tablet Take 100 mg by mouth at bedtime. 06/28/24   [provider]  TRELEGY ELLIPTA  200-62.5-25 MCG/ACT AEPB TAKE 1 PUFF BY MOUTH EVERY DAY 02/09/24   Tamea Dedra CROME, MD    Family History Family History  Problem Relation Age of Onset   Breast cancer Mother 35    Social History Social History[1]   Allergies   Patient has no known allergies.   Review of Systems Review of Systems  Constitutional:  Positive for chills and fever.  HENT:  Positive for congestion, ear pain and sore throat. Negative for rhinorrhea.   Respiratory:  Positive for cough. Negative for shortness of breath and wheezing.      Physical Exam Triage Vital Signs ED Triage Vitals [09/28/24 1359]  Encounter Vitals Group     BP      Girls Systolic BP Percentile      Girls Diastolic BP Percentile      Boys Systolic BP Percentile      Boys Diastolic BP Percentile      Pulse      Resp      Temp      Temp src      SpO2      Weight 123 lb 14.4 oz (56.2 kg)     Height      Head Circumference      Peak Flow      Pain Score      Pain Loc      Pain Education      Exclude from Growth Chart    No data found.  Updated Vital Signs BP (!) 139/90 (BP Location: Left Arm)   Pulse 78   Temp 99.3 F (37.4 C) (Oral)   Resp  18   Wt 123 lb 14.4 oz (56.2 kg)   SpO2 94%   BMI 21.26 kg/m   Visual Acuity Right Eye Distance:   Left Eye Distance:   Bilateral Distance:    Right Eye Near:   Left Eye Near:    Bilateral Near:     Physical Exam Vitals and nursing note reviewed.  Constitutional:      Appearance: Normal appearance. She is not ill-appearing.  HENT:     Head:  Normocephalic and atraumatic.     Right Ear: Tympanic membrane, ear canal and external ear normal. There is no impacted cerumen.     Left Ear: Tympanic membrane, ear canal and external ear normal. There is no impacted cerumen.     Nose: Congestion present. No rhinorrhea.     Comments: Nasal mucosa is erythematous and mildly edematous without appreciable discharge.    Mouth/Throat:     Mouth: Mucous membranes are moist.     Pharynx: Oropharynx is clear. Posterior oropharyngeal erythema present. No oropharyngeal exudate.     Comments: Tonsillar pillars are unremarkable.  Posterior pharynx demonstrates erythema with clear postnasal drip. Neck:     Comments: Patient reports tenderness to palpation of anterior cervical region.  No appreciable lymphadenopathy. Cardiovascular:     Rate and Rhythm: Normal rate and regular rhythm.     Pulses: Normal pulses.     Heart sounds: Normal heart sounds. No murmur heard.    No friction rub. No gallop.  Pulmonary:     Effort: Pulmonary effort is normal.     Breath sounds: Normal breath sounds. No wheezing, rhonchi or rales.  Musculoskeletal:     Cervical back: Normal range of motion and neck supple. Tenderness present.  Lymphadenopathy:     Cervical: No cervical adenopathy.  Skin:    General: Skin is warm and dry.     Capillary Refill: Capillary refill takes less than 2 seconds.     Findings: No rash.  Neurological:     General: No focal deficit present.     Mental Status: She is alert and oriented to person, place, and time.      UC Treatments / Results  Labs (all labs ordered are listed, but  only abnormal results are displayed) Labs Reviewed  POCT RAPID STREP A (OFFICE) - Normal  CULTURE, GROUP A STREP [facility])    EKG   Radiology No results found.  Procedures Procedures (including critical care time)  Medications Ordered in UC Medications - No data to display  Initial Impression / Assessment and Plan / UC Course  I have reviewed the triage vital signs and the nursing notes.  Pertinent labs & imaging results that were available during my care of the patient were reviewed by me and considered in my medical decision making (see chart for details).   Patient is a nontoxic-appearing 64 year old female presenting for evaluation of 5 days worth of respiratory symptoms as outlined in HPI above.  She is very concerned because she was prescribed azithromycin , which she states never worked for her, and she is not feeling any better.  She did e-visit the day her symptoms began and was prescribed azithromycin  for her pharyngitis.  She tested negative for COVID or influenza.  I advised the patient that her symptoms may very well be viral in nature.  I will order a rapid strep to rule out the presence of strep pharyngitis though given that she has been on azithromycin  for 4 days it may sterilize the culture.  Rapid strep is negative.  I will send swab for culture.  Will discharge patient home with a diagnosis of influenza-like illness.  She should continue using Tylenol  and/or ibuprofen as needed for fever or pain.  Atrovent  nasal spray for her nasal congestion.  Tessalon  Perles and Promethazine  DM cough syrup for cough and congestion.  She may continue to gargle with salt water as often as she likes to soothe her throat or use over-the-counter Chloraseptic or Sucrets lozenges.  Final Clinical Impressions(s) / UC Diagnoses   Final diagnoses:  Acute pharyngitis, unspecified etiology  Influenza-like illness     Discharge Instructions      Your strep test today was negative.  We  will send the swab for culture.  However, I suspect that you have a viral illness.  Please continue to use over-the-counter Tylenol  and/or ibuprofen as needed for any fever or pain.  You may gargle with warm salt water as often as you would like to soothe your throat.  Mix 1 tablespoon of table salt in 8 ounces of warm water, gargle and spit.  You may also use over-the-counter Chloraseptic or Sucrets lozenges.  No more than 1 lozenge every 2 hours as the menthol  may give you diarrhea.  Use the Atrovent  nasal spray, 2 squirts in each nostril every 6 hours, as needed for runny nose and postnasal drip.  Use the Tessalon  Perles every 8 hours during the day.  Take them with a small sip of water.  They may give you some numbness to the base of your tongue or a metallic taste in your mouth, this is normal.  Use the Promethazine  DM cough syrup at bedtime for cough and congestion.  It will make you drowsy so do not take it during the day.  Return for reevaluation or see your primary care provider for any new or worsening symptoms.      ED Prescriptions     Medication Sig Dispense Auth. Provider   benzonatate  (TESSALON ) 200 MG capsule Take 1 capsule (200 mg total) by mouth 2 (two) times daily as needed for cough. 60 capsule Bernardino Ditch, NP   ipratropium (ATROVENT ) 0.06 % nasal spray Place 2 sprays into both nostrils 4 (four) times daily. 15 mL Bernardino Ditch, NP   promethazine -dextromethorphan (PROMETHAZINE -DM) 6.25-15 MG/5ML syrup Take 5 mLs by mouth 4 (four) times daily as needed. 118 mL Bernardino Ditch, NP      PDMP not reviewed this encounter.    [1]  Social History Tobacco Use   Smoking status: Every Day    Current packs/day: 0.75    Average packs/day: 1 pack/day for 47.0 years (46.9 ttl pk-yrs)    Types: Cigarettes    Start date: 1979   Smokeless tobacco: Never  Vaping Use   Vaping status: Never Used  Substance Use Topics   Alcohol use: No   Drug use: No     Bernardino Ditch,  NP 09/28/24 1420  "

## 2024-09-28 NOTE — Discharge Instructions (Signed)
 Your strep test today was negative.  We will send the swab for culture.  However, I suspect that you have a viral illness.  Please continue to use over-the-counter Tylenol  and/or ibuprofen as needed for any fever or pain.  You may gargle with warm salt water as often as you would like to soothe your throat.  Mix 1 tablespoon of table salt in 8 ounces of warm water, gargle and spit.  You may also use over-the-counter Chloraseptic or Sucrets lozenges.  No more than 1 lozenge every 2 hours as the menthol  may give you diarrhea.  Use the Atrovent  nasal spray, 2 squirts in each nostril every 6 hours, as needed for runny nose and postnasal drip.  Use the Tessalon  Perles every 8 hours during the day.  Take them with a small sip of water.  They may give you some numbness to the base of your tongue or a metallic taste in your mouth, this is normal.  Use the Promethazine  DM cough syrup at bedtime for cough and congestion.  It will make you drowsy so do not take it during the day.  Return for reevaluation or see your primary care provider for any new or worsening symptoms.

## 2024-09-28 NOTE — ED Triage Notes (Signed)
 Pt c/o fever,congestion,sore throat & ear pain x5 days. Had E-visit on 1/9 w/PCP for the same issue. Tested neg for covid/flu. Given pred w/o relief. Now on Z pack. Has 1 dose left. Has tried OTC meds w/o relief.

## 2024-09-29 ENCOUNTER — Ambulatory Visit: Payer: Self-pay

## 2024-09-29 NOTE — Telephone Encounter (Signed)
 FYI Only or Action Required?: Action required by provider: request for appointment.  Patient was last seen in primary care on 09/23/2024 by Vicci Duwaine SQUIBB, DO.  Called Nurse Triage reporting Sore Throat.  Symptoms began a week ago.  Interventions attempted: Prescription medications: zpak, OTC meds.  Symptoms are: unchanged.  Triage Disposition: See Physician Within 24 Hours  Patient/caregiver understands and will follow disposition?: No  Copied from CRM #[id number]. Topic: Clinical - Red Word Triage >> Sep 29, 2024 11:36 AM Drema MATSU wrote: Red Word that prompted transfer to Nurse Triage: Patient is still having symptoms. Azithromycin  is not helping.  ear Fullness, ear ache, fever, feels that glands are still swollen, diarrhea, and Sore Throat. Reason for Disposition  SEVERE throat pain (e.g., excruciating)  Answer Assessment - Initial Assessment Questions 1. ONSET: When did the throat start hurting? (Hours or days ago)      A week 2. SEVERITY: How bad is the sore throat? (Scale 1-10; mild, moderate or severe)     severe 3. STREP EXPOSURE: Has there been any exposure to strep within the past week? If Yes, ask: What type of contact occurred?      Negative strep test yesterday 4.  VIRAL SYMPTOMS: Are there any symptoms of a cold, such as a runny nose, cough, hoarse voice or red eyes?      Ear ache, fever, diarrhea, sore throat 5. FEVER: Do you have a fever? If Yes, ask: What is your temperature, how was it measured, and when did it start?     Denies taking temp 6. PUS ON THE TONSILS: Is there pus on the tonsils in the back of your throat?     Denies  Pt states that she can only come in 1/19 or 1/20, pt prefers 1/20, routing to schedule for [facility]. Pt was offered appts at regional clinics, pt declined.  Protocols used: Sore Throat-A-AH

## 2024-09-29 NOTE — Telephone Encounter (Signed)
 Called patient to get her scheduled with her PCP but mailbox was full, not able to leave a message.

## 2024-09-29 NOTE — Telephone Encounter (Signed)
 Called to schedule an acute appt,. No answer and mailbox was full.

## 2024-09-29 NOTE — Telephone Encounter (Signed)
 Please get her an in appointment visit with her PCP

## 2024-09-29 NOTE — Telephone Encounter (Signed)
Noted on other encounter 

## 2024-09-30 NOTE — Telephone Encounter (Signed)
 Called patient to get her scheduled. Mailbox is full, I will try to reach her via MyChart

## 2024-10-01 LAB — CULTURE, GROUP A STREP (THRC)

## 2024-10-03 ENCOUNTER — Ambulatory Visit (HOSPITAL_COMMUNITY): Payer: Self-pay

## 2024-10-17 ENCOUNTER — Encounter: Payer: Self-pay | Admitting: Nurse Practitioner

## 2024-10-17 ENCOUNTER — Ambulatory Visit: Payer: Self-pay

## 2024-10-17 ENCOUNTER — Ambulatory Visit: Payer: Self-pay | Admitting: Nurse Practitioner

## 2024-10-17 ENCOUNTER — Telehealth: Admitting: Emergency Medicine

## 2024-10-17 DIAGNOSIS — J329 Chronic sinusitis, unspecified: Secondary | ICD-10-CM

## 2024-10-17 MED ORDER — DOXYCYCLINE HYCLATE 100 MG PO TABS: 100.0000 mg | ORAL_TABLET | Freq: Two times a day (BID) | ORAL | 0 refills | Status: AC

## 2024-10-17 NOTE — Patient Instructions (Signed)
 " Emily Johnston, thank you for joining Emily CHRISTELLA Belt, NP for today's virtual visit.  While this provider is not your primary care provider (PCP), if your PCP is located in our provider database this encounter information will be shared with them immediately following your visit.   A [facility] MyChart account gives you access to today's visit and all your visits, tests, and labs performed at [facility]  click here if you don't have a [facility] [place] MyChart account or go to [email or web address]  Consent: (Patient) Emily Johnston provided verbal consent for this virtual visit at the beginning of the encounter.  Current Medications:  Current Outpatient Medications:    doxycycline  (VIBRA -TABS) 100 MG tablet, Take 1 tablet (100 mg total) by mouth 2 (two) times daily for 7 days., Disp: 14 tablet, Rfl: 0   albuterol  (VENTOLIN  HFA) 108 (90 Base) MCG/ACT inhaler, Inhale 2 puffs into the lungs every 6 (six) hours as needed for wheezing or shortness of breath., Disp: 1 each, Rfl: 3   benzonatate  (TESSALON ) 200 MG capsule, Take 1 capsule (200 mg total) by mouth 2 (two) times daily as needed for cough., Disp: 60 capsule, Rfl: 0   Cholecalciferol  1.25 MG (50000 UT) TABS, Take 1 tablet by mouth once a week., Disp: 12 tablet, Rfl: 4   clonazePAM  (KLONOPIN ) 1 MG tablet, Take 1 mg by mouth 3 (three) times daily as needed for anxiety., Disp: , Rfl:    D3-50 1.25 MG (50000 UT) capsule, TAKE 1 CAPSULE BY MOUHT ONCE WEEK, Disp: 12 capsule, Rfl: 1   fluconazole  (DIFLUCAN ) 150 MG tablet, Take 1 tablet (150 mg total) by mouth daily., Disp: 1 tablet, Rfl: 0   FLUoxetine (PROZAC) 40 MG capsule, Take 40 mg by mouth daily., Disp: , Rfl:    fluticasone  (FLONASE ) 50 MCG/ACT nasal spray, SPRAY 2 SPRAYS INTO EACH NOSTRIL EVERY DAY, Disp: 48 mL, Rfl: 2   folic acid  (FOLVITE ) 1 MG tablet, Take 1 mg by mouth daily., Disp: , Rfl:    ipratropium (ATROVENT ) 0.06 % nasal spray, Place 2 sprays into both  nostrils 4 (four) times daily., Disp: 15 mL, Rfl: 12   leflunomide (ARAVA) 20 MG tablet, Take 20 mg by mouth daily., Disp: , Rfl:    meclizine  (ANTIVERT ) 12.5 MG tablet, Take 1 tablet (12.5 mg total) by mouth 3 (three) times daily as needed for dizziness., Disp: 60 tablet, Rfl: 1   montelukast  (SINGULAIR ) 10 MG tablet, Take 1 tablet (10 mg total) by mouth at bedtime., Disp: 90 tablet, Rfl: 4   Multiple Vitamin (MULTIVITAMIN) tablet, Take 1 tablet by mouth daily., Disp: , Rfl:    promethazine -dextromethorphan (PROMETHAZINE -DM) 6.25-15 MG/5ML syrup, Take 5 mLs by mouth 4 (four) times daily as needed., Disp: 118 mL, Rfl: 0   rosuvastatin  (CRESTOR ) 10 MG tablet, Take 1 tablet (10 mg total) by mouth daily., Disp: 90 tablet, Rfl: 2   traZODone  (DESYREL ) 100 MG tablet, Take 100 mg by mouth at bedtime., Disp: , Rfl:    TRELEGY ELLIPTA  200-62.5-25 MCG/ACT AEPB, TAKE 1 PUFF BY MOUTH EVERY DAY, Disp: 60 each, Rfl: 11   Medications ordered in this encounter:  Meds ordered this encounter  Medications   doxycycline  (VIBRA -TABS) 100 MG tablet    Sig: Take 1 tablet (100 mg total) by mouth 2 (two) times daily for 7 days.    Dispense:  14 tablet    Refill:  0     *If you need refills on other medications prior to your  next appointment, please contact your pharmacy*  Follow-Up: Call back or seek an in-person evaluation if the symptoms worsen or if the condition fails to improve as anticipated.     Other Instructions  Continue using your nose spray prescribed by the walk in clinic.   Start using your albuterol  to see if that helps relieve your cough.   I have prescribed doxycycline  for a possible bacterial sinus infection. I think you may need to see an Ear/Nose/Throat specialist about your sinuses. Please reach out to your primary care provider to talk about this.    If you have been instructed to have an in-person evaluation today at a local Urgent Care facility, please use the link below. It will  take you to a list of all of our available [facility], including address, phone number and hours of operation. Please do not delay care.  [facility]  If you or a family member do not have a primary care provider, use the link below to schedule a visit and establish care. When you choose a [facility] primary care physician or advanced practice provider, you gain a long-term partner in health. Find a Primary Care Provider  Learn more about [facility]'s in-office and virtual care options:  - Get Care Now  "

## 2024-10-17 NOTE — Telephone Encounter (Signed)
 FYI Only or Action Required?: FYI only for provider: appointment scheduled on 10/17/24.  Patient was last seen in primary care on 09/23/2024 by Vicci Duwaine SQUIBB, DO.  Called Nurse Triage reporting Appointment.  Symptoms began about a month ago.  Interventions attempted: OTC medications: ibuprofen, nasal spray.  Symptoms are: gradually worsening.  Triage Disposition: Duplicate Contact Calls  Patient/caregiver understands and will follow disposition?:   Reason for Disposition  Caller has already spoken with another triager or doctor (or NP/PA, pharmacist) AND has further questions AND triager able to answer questions.  Answer Assessment - Initial Assessment Questions Pt triaged today 10/17/24 for cold symptoms - disposition see provider in 24 hours. Pt was not scheduled for appt. Pt calling to see how she can get an antibiotic for symptom management.   Pt scheduled for a virtual UC visit 10/17/24.  Protocols used: No Contact or Duplicate Contact Call-A-AH  Copied from CRM [id number]. Topic: Clinical - Medication Question >> Oct 17, 2024  1:41 PM Antony RAMAN wrote: Reason for CRM: inquiring about getting the antibiotics

## 2024-10-18 NOTE — Telephone Encounter (Signed)
See my chart messages with patient.

## 2024-10-27 ENCOUNTER — Ambulatory Visit: Admitting: Pulmonary Disease

## 2024-11-11 ENCOUNTER — Ambulatory Visit: Admitting: Nurse Practitioner
